# Patient Record
Sex: Female | Born: 1950
Health system: Southern US, Community
[De-identification: ages and names within clinical notes are randomized; demographics above are authoritative.]

## PROBLEM LIST (undated history)

## (undated) DIAGNOSIS — G709 Myoneural disorder, unspecified: Secondary | ICD-10-CM

## (undated) DIAGNOSIS — G4730 Sleep apnea, unspecified: Secondary | ICD-10-CM

## (undated) DIAGNOSIS — M1990 Unspecified osteoarthritis, unspecified site: Secondary | ICD-10-CM

## (undated) DIAGNOSIS — F419 Anxiety disorder, unspecified: Secondary | ICD-10-CM

## (undated) DIAGNOSIS — R059 Cough, unspecified: Secondary | ICD-10-CM

## (undated) DIAGNOSIS — G629 Polyneuropathy, unspecified: Secondary | ICD-10-CM

## (undated) DIAGNOSIS — M109 Gout, unspecified: Secondary | ICD-10-CM

## (undated) DIAGNOSIS — E119 Type 2 diabetes mellitus without complications: Secondary | ICD-10-CM

## (undated) DIAGNOSIS — R2689 Other abnormalities of gait and mobility: Secondary | ICD-10-CM

## (undated) DIAGNOSIS — E079 Disorder of thyroid, unspecified: Secondary | ICD-10-CM

## (undated) DIAGNOSIS — I10 Essential (primary) hypertension: Secondary | ICD-10-CM

## (undated) DIAGNOSIS — C801 Malignant (primary) neoplasm, unspecified: Secondary | ICD-10-CM

## (undated) DIAGNOSIS — R0600 Dyspnea, unspecified: Secondary | ICD-10-CM

## (undated) DIAGNOSIS — R161 Splenomegaly, not elsewhere classified: Secondary | ICD-10-CM

## (undated) DIAGNOSIS — E039 Hypothyroidism, unspecified: Secondary | ICD-10-CM

## (undated) DIAGNOSIS — R05 Cough: Secondary | ICD-10-CM

## (undated) DIAGNOSIS — D759 Disease of blood and blood-forming organs, unspecified: Secondary | ICD-10-CM

## (undated) DIAGNOSIS — Z923 Personal history of irradiation: Secondary | ICD-10-CM

## (undated) DIAGNOSIS — E1140 Type 2 diabetes mellitus with diabetic neuropathy, unspecified: Secondary | ICD-10-CM

## (undated) DIAGNOSIS — K7460 Unspecified cirrhosis of liver: Secondary | ICD-10-CM

## (undated) DIAGNOSIS — C50919 Malignant neoplasm of unspecified site of unspecified female breast: Secondary | ICD-10-CM

## (undated) DIAGNOSIS — G43909 Migraine, unspecified, not intractable, without status migrainosus: Secondary | ICD-10-CM

## (undated) HISTORY — PX: JOINT REPLACEMENT: SHX530

## (undated) HISTORY — DX: Malignant (primary) neoplasm, unspecified: C80.1

## (undated) HISTORY — DX: Migraine, unspecified, not intractable, without status migrainosus: G43.909

## (undated) HISTORY — DX: Anxiety disorder, unspecified: F41.9

## (undated) HISTORY — DX: Essential (primary) hypertension: I10

## (undated) HISTORY — DX: Personal history of irradiation: Z92.3

## (undated) HISTORY — DX: Other abnormalities of gait and mobility: R26.89

## (undated) HISTORY — DX: Cough, unspecified: R05.9

## (undated) HISTORY — PX: TUBAL LIGATION: SHX77

## (undated) HISTORY — DX: Sleep apnea, unspecified: G47.30

## (undated) HISTORY — PX: CHOLECYSTECTOMY: SHX55

## (undated) HISTORY — DX: Cough: R05

---

## 1997-08-07 ENCOUNTER — Ambulatory Visit (HOSPITAL_COMMUNITY): Admission: RE | Admit: 1997-08-07 | Discharge: 1997-08-07 | Payer: Self-pay | Admitting: Obstetrics and Gynecology

## 1997-09-19 ENCOUNTER — Other Ambulatory Visit: Admission: RE | Admit: 1997-09-19 | Discharge: 1997-09-19 | Payer: Self-pay | Admitting: Obstetrics and Gynecology

## 1999-08-18 ENCOUNTER — Ambulatory Visit (HOSPITAL_COMMUNITY): Admission: RE | Admit: 1999-08-18 | Discharge: 1999-08-18 | Payer: Self-pay | Admitting: Family Medicine

## 2002-03-13 ENCOUNTER — Encounter: Payer: Self-pay | Admitting: Family Medicine

## 2002-03-13 ENCOUNTER — Encounter: Admission: RE | Admit: 2002-03-13 | Discharge: 2002-03-13 | Payer: Self-pay | Admitting: Family Medicine

## 2002-10-25 ENCOUNTER — Encounter: Admission: RE | Admit: 2002-10-25 | Discharge: 2002-11-19 | Payer: Self-pay | Admitting: Family Medicine

## 2002-10-27 ENCOUNTER — Encounter: Payer: Self-pay | Admitting: Family Medicine

## 2002-10-27 ENCOUNTER — Encounter: Admission: RE | Admit: 2002-10-27 | Discharge: 2002-10-27 | Payer: Self-pay | Admitting: Family Medicine

## 2004-06-05 ENCOUNTER — Ambulatory Visit (HOSPITAL_BASED_OUTPATIENT_CLINIC_OR_DEPARTMENT_OTHER): Admission: RE | Admit: 2004-06-05 | Discharge: 2004-06-05 | Payer: Self-pay | Admitting: Family Medicine

## 2004-06-14 ENCOUNTER — Ambulatory Visit: Payer: Self-pay | Admitting: Internal Medicine

## 2005-05-18 ENCOUNTER — Encounter: Admission: RE | Admit: 2005-05-18 | Discharge: 2005-05-18 | Payer: Self-pay | Admitting: Internal Medicine

## 2006-07-10 ENCOUNTER — Emergency Department (HOSPITAL_COMMUNITY): Admission: EM | Admit: 2006-07-10 | Discharge: 2006-07-10 | Payer: Self-pay | Admitting: Emergency Medicine

## 2006-10-31 ENCOUNTER — Emergency Department (HOSPITAL_COMMUNITY): Admission: EM | Admit: 2006-10-31 | Discharge: 2006-10-31 | Payer: Self-pay | Admitting: Emergency Medicine

## 2009-07-15 ENCOUNTER — Ambulatory Visit (HOSPITAL_BASED_OUTPATIENT_CLINIC_OR_DEPARTMENT_OTHER): Admission: RE | Admit: 2009-07-15 | Discharge: 2009-07-15 | Payer: Self-pay | Admitting: Family Medicine

## 2009-07-15 ENCOUNTER — Ambulatory Visit: Payer: Self-pay | Admitting: Radiology

## 2010-02-10 ENCOUNTER — Other Ambulatory Visit
Admission: RE | Admit: 2010-02-10 | Discharge: 2010-02-10 | Payer: Self-pay | Source: Home / Self Care | Admitting: Family Medicine

## 2010-02-27 ENCOUNTER — Ambulatory Visit (HOSPITAL_COMMUNITY)
Admission: RE | Admit: 2010-02-27 | Discharge: 2010-02-27 | Payer: Self-pay | Source: Home / Self Care | Attending: Family Medicine | Admitting: Family Medicine

## 2010-06-19 NOTE — Procedures (Signed)
NAME:  [name] [name], [name] [name]                  ACCOUNT NO.:  [id number]   MEDICAL RECORD NO.:  [id number]          PATIENT TYPE:  OUT   LOCATION:  SLEEP CENTER                 FACILITY:  [facility]   PHYSICIAN:  Clinton D. Maple Hudson, M.D. DATE OF BIRTH:  1950/06/16   DATE OF STUDY:  06/05/2004                              NOCTURNAL POLYSOMNOGRAM   REFERRING PHYSICIAN:  Dr. Casimiro Needle Hilts   DATE OF STUDY:  Gaylord 5, 2006   INDICATION FOR STUDY:  Hypersomnia with sleep apnea.  Epworth Sleepiness  Score 18/24, BMI 49, weight 275 pounds.   SLEEP ARCHITECTURE:  Total sleep time 298 minutes with sleep efficiency 67%.  Stage I was 15%, stage II 58%, stages III and IV 19% and REM was 9% of total  sleep time.  Sleep latency 36 minutes, REM latency 303 minutes, awake after  sleep onset 115 minutes, arousal index increased at 36.   RESPIRATORY DATA:  Split-study protocol.  Respiratory disturbance index  (RDI, AHI) 21.4 obstructive events per hour indicating moderate obstructive  sleep apnea/hypopnea syndrome before CPAP.  There were 52 hypopneas before  CPAP.  Events were not positional.  REM RDI 0.  CPAP was titrated  successfully to 9 CWP, RDI 2 per hour with control of snoring.  A small  Respironics ComfortClassic Nasal Mask was used with heated humidifier.   OXYGEN DATA:  Moderate snoring with oxygen desaturation to a nadir of 88%  before CPAP.  After CPAP control oxygen saturation held 94-98% on room air.   CARDIAC DATA:  Normal sinus rhythm with some tachycardia to 110 beats per  minute.   MOVEMENT/PARASOMNIA:  A total of 117 limb jerks were recorded of which 10  were associated with arousal or awakening for a periodic limb movement with  arousal index of 2 per hour which is probably not significant.  EEG revealed  prolonged intervals of high voltage slow waves due to 4 per second.  This  Janssen represent a metabolic or medication effect, possibility of seizure  activity can be excluded from this study.  No  motor abnormality,  incontinence or postictal pattern was noted by the technician.   IMPRESSION/RECOMMENDATION:  1.  Moderate obstructive sleep apnea/hypopnea syndrome, respiratory      disturbance index 21.4 per hour with moderate snoring and oxygen      desaturation to 88%.  2.  Successful continuous positive airway pressure titration to 9 CWP,      respiratory disturbance index 2 per hour, using a small Respironics      ComfortClassic Nasal Mask with heated humidifier.  3.  Occasional sinus tachycardia to 110 during sleep.  4.  Atypical EEG pattern with high voltage slow waves, a metabolic      abnormality or seizure pattern cannot be completely excluded from this      study.      CDY/MEDQ  D:  06/14/2004 10:19:22  T:  06/14/2004 11:51:59  Job:  [id number]

## 2010-07-29 ENCOUNTER — Encounter (HOSPITAL_COMMUNITY): Payer: BC Managed Care – PPO

## 2010-07-29 ENCOUNTER — Other Ambulatory Visit (HOSPITAL_COMMUNITY): Payer: Self-pay | Admitting: Orthopaedic Surgery

## 2010-07-29 ENCOUNTER — Other Ambulatory Visit: Payer: Self-pay | Admitting: Orthopaedic Surgery

## 2010-07-29 ENCOUNTER — Ambulatory Visit (HOSPITAL_COMMUNITY)
Admission: RE | Admit: 2010-07-29 | Discharge: 2010-07-29 | Disposition: A | Discharge status: Home / Self Care | Payer: BC Managed Care – PPO | Source: Ambulatory Visit | Attending: Orthopaedic Surgery | Admitting: Orthopaedic Surgery

## 2010-07-29 DIAGNOSIS — M1712 Unilateral primary osteoarthritis, left knee: Secondary | ICD-10-CM

## 2010-07-29 DIAGNOSIS — Z01811 Encounter for preprocedural respiratory examination: Secondary | ICD-10-CM | POA: Insufficient documentation

## 2010-07-29 DIAGNOSIS — M1710 Unilateral primary osteoarthritis, unspecified knee: Secondary | ICD-10-CM | POA: Insufficient documentation

## 2010-07-29 DIAGNOSIS — E119 Type 2 diabetes mellitus without complications: Secondary | ICD-10-CM | POA: Insufficient documentation

## 2010-07-29 DIAGNOSIS — I10 Essential (primary) hypertension: Secondary | ICD-10-CM

## 2010-07-29 DIAGNOSIS — Z79899 Other long term (current) drug therapy: Secondary | ICD-10-CM | POA: Insufficient documentation

## 2010-07-29 DIAGNOSIS — Z01812 Encounter for preprocedural laboratory examination: Secondary | ICD-10-CM | POA: Insufficient documentation

## 2010-07-29 LAB — CBC
MCH: 28.7 pg (ref 26.0–34.0)
MCHC: 32.3 g/dL (ref 30.0–36.0)
Platelets: 109 10*3/uL — ABNORMAL LOW (ref 150–400)
RBC: 4.18 MIL/uL (ref 3.87–5.11)
RDW: 14.7 % (ref 11.5–15.5)

## 2010-07-29 LAB — URINALYSIS, ROUTINE W REFLEX MICROSCOPIC
Bilirubin Urine: NEGATIVE
Nitrite: NEGATIVE
Specific Gravity, Urine: 1.015 (ref 1.005–1.030)
Urobilinogen, UA: 0.2 mg/dL (ref 0.0–1.0)

## 2010-07-29 LAB — BASIC METABOLIC PANEL
Calcium: 9.7 mg/dL (ref 8.4–10.5)
GFR calc non Af Amer: >60 mL/min (ref >60)
Glucose, Bld: 130 mg/dL — ABNORMAL HIGH (ref 70–99)
Sodium: 139 mEq/L (ref 135–145)

## 2010-07-29 LAB — SURGICAL PCR SCREEN: MRSA, PCR: NEGATIVE

## 2010-07-31 ENCOUNTER — Inpatient Hospital Stay (HOSPITAL_COMMUNITY)
Admission: RE | Admit: 2010-07-31 | Discharge: 2010-08-03 | DRG: 209 | Disposition: A | Discharge status: Home Health | Payer: BC Managed Care – PPO | Source: Ambulatory Visit | Attending: Orthopaedic Surgery | Admitting: Orthopaedic Surgery

## 2010-07-31 ENCOUNTER — Inpatient Hospital Stay (HOSPITAL_COMMUNITY): Payer: BC Managed Care – PPO

## 2010-07-31 DIAGNOSIS — E669 Obesity, unspecified: Secondary | ICD-10-CM | POA: Diagnosis present

## 2010-07-31 DIAGNOSIS — M1710 Unilateral primary osteoarthritis, unspecified knee: Principal | ICD-10-CM | POA: Diagnosis present

## 2010-07-31 DIAGNOSIS — D62 Acute posthemorrhagic anemia: Secondary | ICD-10-CM | POA: Diagnosis not present

## 2010-07-31 DIAGNOSIS — E119 Type 2 diabetes mellitus without complications: Secondary | ICD-10-CM | POA: Diagnosis present

## 2010-07-31 DIAGNOSIS — G4733 Obstructive sleep apnea (adult) (pediatric): Secondary | ICD-10-CM | POA: Diagnosis present

## 2010-07-31 DIAGNOSIS — Z01812 Encounter for preprocedural laboratory examination: Secondary | ICD-10-CM

## 2010-07-31 DIAGNOSIS — I10 Essential (primary) hypertension: Secondary | ICD-10-CM | POA: Diagnosis present

## 2010-07-31 LAB — PROTIME-INR
INR: 1.25 (ref 0.00–1.49)
Prothrombin Time: 16 seconds — ABNORMAL HIGH (ref 11.6–15.2)

## 2010-07-31 LAB — APTT: aPTT: 38 seconds — ABNORMAL HIGH (ref 24–37)

## 2010-07-31 LAB — TYPE AND SCREEN: Antibody Screen: NEGATIVE

## 2010-07-31 LAB — GLUCOSE, CAPILLARY
Glucose-Capillary: 105 mg/dL — ABNORMAL HIGH (ref 70–99)
Glucose-Capillary: 139 mg/dL — ABNORMAL HIGH (ref 70–99)

## 2010-08-01 LAB — CBC
MCHC: 32.8 g/dL (ref 30.0–36.0)
Platelets: 118 10*3/uL — ABNORMAL LOW (ref 150–400)
RDW: 14.9 % (ref 11.5–15.5)

## 2010-08-01 LAB — BASIC METABOLIC PANEL
GFR calc Af Amer: >60 mL/min (ref >60)
GFR calc non Af Amer: >60 mL/min (ref >60)
Potassium: 4.6 mEq/L (ref 3.5–5.1)
Sodium: 137 mEq/L (ref 135–145)

## 2010-08-01 NOTE — H&P (Signed)
  NAMEHAYAT, WARBINGTON NO.:  [id number]  MEDICAL RECORD NO.:  [id number]  LOCATION:  0008                         FACILITY:  [facility]  PHYSICIAN:  Vanita Panda. Magnus Ivan, M.D.DATE OF BIRTH:  18-Feb-1950  DATE OF ADMISSION:  07/31/2010 DATE OF DISCHARGE:                             HISTORY & PHYSICAL   CHIEF COMPLAINT:  Severe left knee pain.  HISTORY OF PRESENT ILLNESS:  [name] is a 60 year old female well known to me.  She has severe debilitating pain over the left knee with known end-stage arthritis.  We have tried injections and other things to calm down this, bringing her to tears now the amount of pain that she is having.  She wishes at this point to proceed with total knee replacement.  She understands the risks and benefits of this as well and does wish to proceed with surgery.  PAST MEDICAL HISTORY: 1. Diabetes. 2. Thyroid disease. 3. Arthritis. 4. Depression. 5. Gout. 6. Peripheral neuropathy.  MEDICATIONS:  See medical reconciliation orders.  ALLERGIES:  Drug allergies to CODEINE.  FAMILY MEDICAL HISTORY:  Significant for diabetes.  SOCIAL HISTORY:  She is married.  Her husband is with her.  She works as a Youth worker for the [employer]  She does not smoke and does not drink.  REVIEW OF SYSTEMS:  Negative for chest pain, shortness of breath, fever, chills, nausea, vomiting.  PHYSICAL EXAMINATION:  VITAL SIGNS:  She is afebrile with stable vital signs. GENERAL:  She is alert and oriented x3, in no acute distress. HEENT:  Normocephalic, atraumatic.  Pupils equally round and reactive to light.  Extraocular muscles intact. NECK:  Supple. LUNGS:  Clear to auscultation bilaterally. HEART:  Regular rate and rhythm. ABDOMEN:  Obese but benign. EXTREMITIES:  Left knee, she has a slight valgus deformity with effusion and severe pain with flexion and extension.  She has good motion overall.  X-rays were reviewed and show  end-stage arthritis of the left knee.  ASSESSMENT:  This is a 60 year old female who is diabetic with end-stage arthritis of the left knee.  PLAN:  We will proceed with left total knee arthroplasty today.  She will be admitted as inpatient.     Vanita Panda. Magnus Ivan, M.D.     CYB/MEDQ  D:  07/31/2010  T:  07/31/2010  Job:  [id number]  Electronically Signed by Doneen Poisson M.D. on 08/01/2010 05:11:01 PM

## 2010-08-01 NOTE — Op Note (Signed)
[name] [name], [name] [name] NO.:  [id number]  MEDICAL RECORD NO.:  [id number]  LOCATION:  1606                         FACILITY:  [facility]  PHYSICIAN:  Vanita Panda. Magnus Ivan, M.D.DATE OF BIRTH:  Aug 22, 1950  DATE OF PROCEDURE:  07/31/2010 DATE OF DISCHARGE:                              OPERATIVE REPORT   PREOPERATIVE DIAGNOSES:  Severe osteoarthritis and degenerative joint disease, left knee.  POSTOPERATIVE DIAGNOSES:  Severe osteoarthritis and degenerative joint disease, left knee.  PROCEDURE:  Left total knee arthroplasty utilizing computer navigation assistance.  IMPLANTS:  DePuy rotating platform knee/PFC Sigma with size 4 narrow femur, size 3 tibial tray, size 10 polyethylene insert, size 32 polyethylene patellar button.  SURGEON:  Vanita Panda. Magnus Ivan, MD  ASSISTANT:  Wende Neighbors, P.A.  ANESTHESIA: 1. Regional left femoral nerve block. 2. Spinal.  ANTIBIOTICS:  IV Ancef 1 g.  TOURNIQUET TIME:  95 minutes.  BLOOD LOSS:  300 cc.  COMPLICATIONS:  None.  INDICATIONS:  Ms. Deckman is a 60 year old gentleman with debilitating arthritis involving her left knee.  She has a significant valgus deformity and flexion contracture of the knee as well.  We have tried conservative treatment with anti-inflammatories, injections.  It has gotten to where it has affected her activities of daily living and she is crying quite a bit because her pain has been so severe.  She wishes to proceed with a total knee arthroplasty.  The risks and benefits of this has been explained to her in detail and she does wish to proceed with surgery.  PROCEDURE DESCRIPTION:  After informed consent was obtained, appropriate left leg was marked.  Anesthesia was obtained with a femoral nerve block.  She was then brought to the operating room, placed supine on the operating table.  She was sat up first for spinal anesthesia as well and then laid down supine.  A Foley catheter was  placed and a nonsterile tourniquet was placed around the upper left leg.  The left leg was then prepped and draped with DuraPrep and sterile drapes including a sterile stockinette.  A time-out was called and she was identified as correct patient and correct left knee.  I then used an Esmarch to wrap out the knee and the tourniquet was inflated to 300 mm of pressure.  A midline incision was then made directly over the knee and carried proximally and distally.  We dissected down to the knee joint and then performed a medial parapatellar arthrotomy.  There were significant osteophytes and synovitis within the knee and complete loss of cartilage throughout the knee.  We removed osteophytes with a rongeur as well as remnants of the PCL, ACL, and meniscus.  Next, attention was turned to the computer navigation portion of the case.  Through 2 small stab incisions in the distal third of the tibia, 2 temporary Steinmann pins were placed from the anteromedial to posterolateral direction.  To the main incision, 2 pins were also placed in the metaphyseal section of the femur.  Off these Steinmann pins, small globes were placed for computer navigation portion of the case.  We put the hip through internal and external rotation, flexion and extension of  the knee and marked several spots throughout the knee per our computer plan to come up with a computer- generated model of the knee that showed a significant flexion contracture of about 12 degrees and a valgus deformity of 7 degrees. Based on our computer navigation plan, we were able to release soft tissues and make a tibia cut starting at 10 mm off the high side.  We then actually had to cut 13 mm off the distal femur as well.  We used soft tissue tensioners to balance the knee in flexion and extension.  We set our rotation guide for the femur and then all using the computer. We then made our anterior and posterior cuts and chamfer cuts as  well. Attention was turned to releasing osteophytes from the posterior femoral condyles and cleaning debris from behind the knee.  We then selected a size 3 tibia and drilled a post and keel for this.  We placed a trial 4 narrow femur followed by size 3 tibial tray and a 10 mm polyethylene insert and looking at the computer navigation screen and the knee, we were able to correct her flexion contracture down to neutral as well as bring her out of valgus to neutral alignment.  We then removed all trial components.  We removed all Steinmann pins as well.  We cemented the real size 4 narrow femur, the real size 3 tibial tray.  We placed the real 10 mm polyethylene insert and cemented the patellar button as well. We then allowed the cement to dry and harden.  The tourniquet was let down and hemostasis was obtained with electrocautery.  We then copiously irrigated the knee with normal saline solution using pulsatile lavage of 3 L.  I placed a medium Hemovac drain and closed the arthrotomy with interrupted #1 Vicryl followed by 2-0 Vicryl in the subcutaneous tissue and staples on the skin.  The 2 small stab incisions from the Steinmann pins were closed with interrupted 3-0 nylon.  Xeroform followed by well- padded sterile dressing was applied and the patient was taken to recovery room in stable condition with all final counts being correct and no complications noted.  Of note, Maud Deed, [facility] was present for the entirety of the case. Her assistance was integral in the computer navigation aspect of the case as well as placing the implants and closure.     Vanita Panda. Magnus Ivan, M.D.     CYB/MEDQ  D:  07/31/2010  T:  08/01/2010  Job:  [id number]  Electronically Signed by Doneen Poisson M.D. on 08/01/2010 05:11:04 PM

## 2010-08-02 LAB — BASIC METABOLIC PANEL
CO2: 26 mEq/L (ref 19–32)
GFR calc non Af Amer: >60 mL/min (ref >60)
Glucose, Bld: 156 mg/dL — ABNORMAL HIGH (ref 70–99)
Potassium: 4.5 mEq/L (ref 3.5–5.1)
Sodium: 136 mEq/L (ref 135–145)

## 2010-08-02 LAB — CBC
Hemoglobin: 9.4 g/dL — ABNORMAL LOW (ref 12.0–15.0)
Platelets: 107 10*3/uL — ABNORMAL LOW (ref 150–400)
RBC: 3.22 MIL/uL — ABNORMAL LOW (ref 3.87–5.11)

## 2010-08-03 LAB — BASIC METABOLIC PANEL
CO2: 27 mEq/L (ref 19–32)
Calcium: 9.2 mg/dL (ref 8.4–10.5)
Chloride: 101 mEq/L (ref 96–112)
Glucose, Bld: 175 mg/dL — ABNORMAL HIGH (ref 70–99)
Sodium: 137 mEq/L (ref 135–145)

## 2010-08-03 LAB — CBC
Hemoglobin: 9.3 g/dL — ABNORMAL LOW (ref 12.0–15.0)
MCH: 29.1 pg (ref 26.0–34.0)
RBC: 3.2 MIL/uL — ABNORMAL LOW (ref 3.87–5.11)
WBC: 7.4 10*3/uL (ref 4.0–10.5)

## 2010-08-03 LAB — GLUCOSE, CAPILLARY: Glucose-Capillary: 173 mg/dL — ABNORMAL HIGH (ref 70–99)

## 2010-08-07 NOTE — Discharge Summary (Signed)
  NAMEKESLEIGH, [name] NO.:  [id number]  MEDICAL RECORD NO.:  [id number]  LOCATION:  1606                         FACILITY:  [facility]  PHYSICIAN:  Vanita Panda. Magnus Ivan, M.D.DATE OF BIRTH:  29-May-1950  DATE OF ADMISSION:  07/31/2010 DATE OF DISCHARGE:  08/03/2010                              DISCHARGE SUMMARY   ADMITTING DIAGNOSES:  Severe osteoarthritis and degenerative joint disease, left knee.  POSTOPERATIVE DIAGNOSES:  Severe osteoarthritis and degenerative joint disease, left knee.  PROCEDURE:  Left total knee arthroplasty on July 31, 2010.  HOSPITAL COURSE:  [name] is a 60 year old female with severe debilitating arthritis involving her left knee.  She was taken to the operating room on the day of admission where she underwent an elective total knee arthroplasty on the left knee.  This was uneventful.  She was admitted as an inpatient and had an uneventful hospital stay.  She worked with PT and OT and by the day of discharge, it was felt she could be discharged safely to home.  DISPOSITION:  Discharged to home.  DISCHARGE MEDICATIONS: 1. Xarelto for 10 days. 2. Percocet. 3. Robaxin. 4. Continue home medications as before including multivitamins,     lisinopril, gabapentin, metformin, and levothyroxine.  DISCHARGE INSTRUCTIONS:  While she is at home, she can get her incision wet in shower, starting this one today.  She can work on balance, gait training, coordination, and range of motion with therapy.  A followup appointment will be established in 2 weeks.     Vanita Panda. Magnus Ivan, M.D.     CYB/MEDQ  D:  08/03/2010  T:  08/03/2010  Job:  [id number]  Electronically Signed by Doneen Poisson M.D. on 08/07/2010 06:30:16 PM

## 2010-11-12 LAB — APTT: aPTT: 29

## 2010-11-12 LAB — DIFFERENTIAL
Basophils Absolute: 0.1
Lymphocytes Relative: 31
Monocytes Absolute: 0.6
Neutro Abs: 5.7
Neutrophils Relative %: 59

## 2010-11-12 LAB — COMPREHENSIVE METABOLIC PANEL
Albumin: 4.4
BUN: 12
Chloride: 104
Creatinine, Ser: 0.82
Total Bilirubin: 0.7
Total Protein: 8.7 — ABNORMAL HIGH

## 2010-11-12 LAB — PROTIME-INR
INR: 1
Prothrombin Time: 13.4

## 2010-11-12 LAB — URINE CULTURE: Colony Count: 5000

## 2010-11-12 LAB — URINALYSIS, ROUTINE W REFLEX MICROSCOPIC
Bilirubin Urine: NEGATIVE
Glucose, UA: NEGATIVE
Hgb urine dipstick: NEGATIVE
Ketones, ur: NEGATIVE
Nitrite: NEGATIVE
Protein, ur: NEGATIVE
Specific Gravity, Urine: 1.014
Urobilinogen, UA: 0.2
pH: 5.5

## 2010-11-12 LAB — CBC
HCT: 42
MCV: 86.9
Platelets: 270
RDW: 13.8

## 2011-12-09 ENCOUNTER — Emergency Department (HOSPITAL_COMMUNITY): Payer: BC Managed Care – PPO

## 2011-12-09 ENCOUNTER — Observation Stay (HOSPITAL_COMMUNITY)
Admission: EM | Admit: 2011-12-09 | Discharge: 2011-12-10 | Disposition: A | Discharge status: Home / Self Care | Payer: BC Managed Care – PPO | Attending: General Surgery | Admitting: General Surgery

## 2011-12-09 ENCOUNTER — Encounter (HOSPITAL_COMMUNITY): Payer: Self-pay

## 2011-12-09 DIAGNOSIS — Y92410 Unspecified street and highway as the place of occurrence of the external cause: Secondary | ICD-10-CM | POA: Insufficient documentation

## 2011-12-09 DIAGNOSIS — R079 Chest pain, unspecified: Secondary | ICD-10-CM | POA: Insufficient documentation

## 2011-12-09 DIAGNOSIS — E119 Type 2 diabetes mellitus without complications: Secondary | ICD-10-CM

## 2011-12-09 DIAGNOSIS — S301XXA Contusion of abdominal wall, initial encounter: Secondary | ICD-10-CM

## 2011-12-09 DIAGNOSIS — S20219A Contusion of unspecified front wall of thorax, initial encounter: Principal | ICD-10-CM

## 2011-12-09 DIAGNOSIS — R51 Headache: Secondary | ICD-10-CM | POA: Insufficient documentation

## 2011-12-09 DIAGNOSIS — Z96659 Presence of unspecified artificial knee joint: Secondary | ICD-10-CM | POA: Insufficient documentation

## 2011-12-09 DIAGNOSIS — E1142 Type 2 diabetes mellitus with diabetic polyneuropathy: Secondary | ICD-10-CM | POA: Insufficient documentation

## 2011-12-09 DIAGNOSIS — E1149 Type 2 diabetes mellitus with other diabetic neurological complication: Secondary | ICD-10-CM | POA: Insufficient documentation

## 2011-12-09 DIAGNOSIS — S3011XA Contusion of abdominal wall, initial encounter: Secondary | ICD-10-CM

## 2011-12-09 DIAGNOSIS — Z79899 Other long term (current) drug therapy: Secondary | ICD-10-CM | POA: Insufficient documentation

## 2011-12-09 DIAGNOSIS — M549 Dorsalgia, unspecified: Secondary | ICD-10-CM | POA: Insufficient documentation

## 2011-12-09 DIAGNOSIS — M79609 Pain in unspecified limb: Secondary | ICD-10-CM | POA: Insufficient documentation

## 2011-12-09 HISTORY — DX: Type 2 diabetes mellitus without complications: E11.9

## 2011-12-09 HISTORY — DX: Gout, unspecified: M10.9

## 2011-12-09 HISTORY — DX: Disorder of thyroid, unspecified: E07.9

## 2011-12-09 HISTORY — DX: Polyneuropathy, unspecified: G62.9

## 2011-12-09 HISTORY — DX: Contusion of unspecified front wall of thorax, initial encounter: S20.219A

## 2011-12-09 HISTORY — DX: Contusion of abdominal wall, initial encounter: S30.1XXA

## 2011-12-09 LAB — COMPREHENSIVE METABOLIC PANEL
CO2: 21 mEq/L (ref 19–32)
Calcium: 10.3 mg/dL (ref 8.4–10.5)
Creatinine, Ser: 0.67 mg/dL (ref 0.50–1.10)
GFR calc Af Amer: >90 mL/min (ref >90)
GFR calc non Af Amer: >90 mL/min (ref >90)
Glucose, Bld: 166 mg/dL — ABNORMAL HIGH (ref 70–99)

## 2011-12-09 LAB — CBC WITH DIFFERENTIAL/PLATELET
Eosinophils Absolute: 0.3 10*3/uL (ref 0.0–0.7)
Eosinophils Relative: 4 % (ref 0–5)
HCT: 33.7 % — ABNORMAL LOW (ref 36.0–46.0)
Hemoglobin: 11.1 g/dL — ABNORMAL LOW (ref 12.0–15.0)
Lymphocytes Relative: 22 % (ref 12–46)
Lymphs Abs: 2 10*3/uL (ref 0.7–4.0)
MCH: 28.2 pg (ref 26.0–34.0)
MCV: 85.5 fL (ref 78.0–100.0)
Monocytes Absolute: 0.6 10*3/uL (ref 0.1–1.0)
Monocytes Relative: 7 % (ref 3–12)
RBC: 3.94 MIL/uL (ref 3.87–5.11)

## 2011-12-09 LAB — POCT I-STAT, CHEM 8
Chloride: 105 mEq/L (ref 96–112)
HCT: 34 % — ABNORMAL LOW (ref 36.0–46.0)
Potassium: 3.8 mEq/L (ref 3.5–5.1)
Sodium: 140 mEq/L (ref 135–145)

## 2011-12-09 LAB — URINALYSIS, ROUTINE W REFLEX MICROSCOPIC
Glucose, UA: NEGATIVE mg/dL
Hgb urine dipstick: NEGATIVE
Ketones, ur: NEGATIVE mg/dL
Protein, ur: NEGATIVE mg/dL
pH: 6 (ref 5.0–8.0)

## 2011-12-09 MED ORDER — GABAPENTIN 600 MG PO TABS
1200.0000 mg | ORAL_TABLET | Freq: Every day | ORAL | Status: DC
  Administered 2011-12-09: 1200 mg via ORAL
  Filled 2011-12-09 (×2): qty 2

## 2011-12-09 MED ORDER — HYDROMORPHONE HCL PF 1 MG/ML IJ SOLN
0.5000 mg | INTRAMUSCULAR | Status: DC | PRN
  Administered 2011-12-10: 1 mg via INTRAVENOUS
  Filled 2011-12-09: qty 1

## 2011-12-09 MED ORDER — SODIUM CHLORIDE 0.9 % IV SOLN
1000.0000 mL | INTRAVENOUS | Status: DC
  Administered 2011-12-09: 1000 mL via INTRAVENOUS

## 2011-12-09 MED ORDER — ENOXAPARIN SODIUM 40 MG/0.4ML ~~LOC~~ SOLN
40.0000 mg | Freq: Two times a day (BID) | SUBCUTANEOUS | Status: DC
  Administered 2011-12-10: 40 mg via SUBCUTANEOUS
  Filled 2011-12-09 (×2): qty 0.4

## 2011-12-09 MED ORDER — INSULIN ASPART 100 UNIT/ML ~~LOC~~ SOLN: 0.0000 [IU] | Freq: Every day | SUBCUTANEOUS | Status: DC

## 2011-12-09 MED ORDER — ONDANSETRON HCL 4 MG PO TABS: 4.0000 mg | ORAL_TABLET | Freq: Four times a day (QID) | ORAL | Status: DC | PRN

## 2011-12-09 MED ORDER — IOHEXOL 300 MG/ML  SOLN
100.0000 mL | Freq: Once | INTRAMUSCULAR | Status: AC | PRN
  Administered 2011-12-09: 100 mL via INTRAVENOUS

## 2011-12-09 MED ORDER — ONDANSETRON HCL 4 MG/2ML IJ SOLN
4.0000 mg | Freq: Once | INTRAMUSCULAR | Status: AC
  Administered 2011-12-09: 4 mg via INTRAVENOUS
  Filled 2011-12-09: qty 2

## 2011-12-09 MED ORDER — ONDANSETRON HCL 4 MG/2ML IJ SOLN: 4.0000 mg | Freq: Four times a day (QID) | INTRAMUSCULAR | Status: DC | PRN

## 2011-12-09 MED ORDER — COLCHICINE 0.6 MG PO TABS
0.6000 mg | ORAL_TABLET | Freq: Two times a day (BID) | ORAL | Status: DC | PRN
  Filled 2011-12-09: qty 1

## 2011-12-09 MED ORDER — POTASSIUM CHLORIDE IN NACL 20-0.45 MEQ/L-% IV SOLN
INTRAVENOUS | Status: DC
  Administered 2011-12-09: 20:00:00 via INTRAVENOUS
  Filled 2011-12-09 (×3): qty 1000

## 2011-12-09 MED ORDER — ACETAMINOPHEN 325 MG PO TABS: 650.0000 mg | ORAL_TABLET | ORAL | Status: DC | PRN

## 2011-12-09 MED ORDER — LORAZEPAM 2 MG/ML IJ SOLN
1.0000 mg | Freq: Once | INTRAMUSCULAR | Status: AC
  Administered 2011-12-09: 1 mg via INTRAVENOUS
  Filled 2011-12-09: qty 1

## 2011-12-09 MED ORDER — GABAPENTIN 600 MG PO TABS
600.0000 mg | ORAL_TABLET | ORAL | Status: DC
Start: 2011-12-10 — End: 2011-12-10
  Administered 2011-12-10: 600 mg via ORAL
  Filled 2011-12-09 (×2): qty 1

## 2011-12-09 MED ORDER — GABAPENTIN 600 MG PO TABS
600.0000 mg | ORAL_TABLET | Freq: Three times a day (TID) | ORAL | Status: DC
  Filled 2011-12-09 (×2): qty 2

## 2011-12-09 MED ORDER — ONDANSETRON HCL 4 MG/2ML IJ SOLN
INTRAMUSCULAR | Status: AC
  Administered 2011-12-09: 4 mg via INTRAVENOUS
  Filled 2011-12-09: qty 2

## 2011-12-09 MED ORDER — ALPRAZOLAM 0.25 MG PO TABS
0.2500 mg | ORAL_TABLET | Freq: Two times a day (BID) | ORAL | Status: DC | PRN
  Administered 2011-12-09: 0.5 mg via ORAL
  Filled 2011-12-09 (×2): qty 1

## 2011-12-09 MED ORDER — ALLOPURINOL 300 MG PO TABS
300.0000 mg | ORAL_TABLET | Freq: Every day | ORAL | Status: DC
  Administered 2011-12-10: 300 mg via ORAL
  Filled 2011-12-09: qty 1

## 2011-12-09 MED ORDER — FENTANYL CITRATE 0.05 MG/ML IJ SOLN
50.0000 ug | Freq: Once | INTRAMUSCULAR | Status: AC
  Administered 2011-12-09: 50 ug via INTRAVENOUS
  Filled 2011-12-09: qty 2

## 2011-12-09 MED ORDER — TRAMADOL HCL 50 MG PO TABS
50.0000 mg | ORAL_TABLET | Freq: Four times a day (QID) | ORAL | Status: DC | PRN
  Administered 2011-12-09 – 2011-12-10 (×2): 50 mg via ORAL
  Filled 2011-12-09 (×2): qty 1

## 2011-12-09 MED ORDER — SODIUM CHLORIDE 0.9 % IV SOLN
1000.0000 mL | Freq: Once | INTRAVENOUS | Status: AC
  Administered 2011-12-09: 1000 mL via INTRAVENOUS

## 2011-12-09 MED ORDER — CITALOPRAM HYDROBROMIDE 20 MG PO TABS
20.0000 mg | ORAL_TABLET | Freq: Every day | ORAL | Status: DC
  Administered 2011-12-10: 20 mg via ORAL
  Filled 2011-12-09: qty 1

## 2011-12-09 MED ORDER — INSULIN ASPART 100 UNIT/ML ~~LOC~~ SOLN
0.0000 [IU] | Freq: Three times a day (TID) | SUBCUTANEOUS | Status: DC
  Administered 2011-12-10: 3 [IU] via SUBCUTANEOUS
  Administered 2011-12-10: 4 [IU] via SUBCUTANEOUS

## 2011-12-09 MED ORDER — CELECOXIB 200 MG PO CAPS
200.0000 mg | ORAL_CAPSULE | Freq: Every day | ORAL | Status: DC
  Administered 2011-12-10: 200 mg via ORAL
  Filled 2011-12-09 (×2): qty 1

## 2011-12-09 MED ORDER — GABAPENTIN 600 MG PO TABS
600.0000 mg | ORAL_TABLET | ORAL | Status: DC
  Administered 2011-12-10: 600 mg via ORAL
  Filled 2011-12-09: qty 1

## 2011-12-09 MED ORDER — LISINOPRIL 40 MG PO TABS
80.0000 mg | ORAL_TABLET | Freq: Every day | ORAL | Status: DC
  Administered 2011-12-10: 80 mg via ORAL
  Filled 2011-12-09: qty 2

## 2011-12-09 MED ORDER — LEVOTHYROXINE SODIUM 200 MCG PO TABS
200.0000 ug | ORAL_TABLET | Freq: Every day | ORAL | Status: DC
  Administered 2011-12-10: 200 ug via ORAL
  Filled 2011-12-09 (×2): qty 1

## 2011-12-09 NOTE — ED Notes (Signed)
Trauma md at bedside
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
## 2011-12-09 NOTE — Progress Notes (Signed)
Pt put on CPAP 11:38. Vitals are stable HR 205. RR 18. Oxygen saturation 95%. Auto mode minimum 5 cmH2O maximum 20cmH2O. Full face mask size medium. Pt is comfortable will continue to monitor.

## 2011-12-09 NOTE — ED Notes (Signed)
Attempted to call report x1, asked to call back 
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
## 2011-12-09 NOTE — ED Notes (Signed)
Pt nausea improving.
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
## 2011-12-09 NOTE — ED Notes (Signed)
Report received, pt remains in radiology at this time.

## 2011-12-09 NOTE — ED Notes (Signed)
Pt unable to be transferred per floor for minimum of 45 minutes. Charge made aware.

## 2011-12-10 LAB — BASIC METABOLIC PANEL
GFR calc Af Amer: >90 mL/min (ref >90)
GFR calc non Af Amer: >90 mL/min (ref >90)
Glucose, Bld: 135 mg/dL — ABNORMAL HIGH (ref 70–99)
Potassium: 4 mEq/L (ref 3.5–5.1)
Sodium: 138 mEq/L (ref 135–145)

## 2011-12-10 LAB — GLUCOSE, CAPILLARY
Glucose-Capillary: 142 mg/dL — ABNORMAL HIGH (ref 70–99)
Glucose-Capillary: 169 mg/dL — ABNORMAL HIGH (ref 70–99)

## 2011-12-10 LAB — CBC
HCT: 28.2 % — ABNORMAL LOW (ref 36.0–46.0)
Hemoglobin: 9 g/dL — ABNORMAL LOW (ref 12.0–15.0)
RBC: 3.29 MIL/uL — ABNORMAL LOW (ref 3.87–5.11)
RDW: 15.2 % (ref 11.5–15.5)
WBC: 6.3 10*3/uL (ref 4.0–10.5)

## 2011-12-10 MED ORDER — TRAMADOL HCL 50 MG PO TABS: 50.0000 mg | ORAL_TABLET | Freq: Four times a day (QID) | ORAL | Status: DC | PRN

## 2011-12-10 MED ORDER — DIPHENHYDRAMINE HCL 12.5 MG/5ML PO ELIX
25.0000 mg | ORAL_SOLUTION | Freq: Four times a day (QID) | ORAL | Status: DC | PRN
  Administered 2011-12-10: 25 mg via ORAL
  Filled 2011-12-10: qty 5

## 2011-12-10 NOTE — Discharge Summary (Signed)
Her hemoglobin has dropped, but she has enough soft tissue injury to account for the blood loss.  This patient has been seen and I agree with the findings and treatment plan.  Marta Lamas. Gae Bon, MD, FACS [phone] (pager) [phone] (direct pager) Trauma Surgeon
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
## 2011-12-14 NOTE — ED Provider Notes (Signed)
Medical screening examination/treatment/procedure(s) were conducted as a shared visit with non-physician practitioner(s) and myself.  I personally evaluated the patient during the encounter  Flint Melter, MD 12/14/11 [phone]

## 2012-04-18 ENCOUNTER — Other Ambulatory Visit: Payer: Self-pay | Admitting: Family Medicine

## 2012-04-18 DIAGNOSIS — N644 Mastodynia: Secondary | ICD-10-CM

## 2012-04-18 DIAGNOSIS — N63 Unspecified lump in unspecified breast: Secondary | ICD-10-CM

## 2012-04-25 ENCOUNTER — Other Ambulatory Visit: Payer: Self-pay | Admitting: Family Medicine

## 2012-04-25 DIAGNOSIS — N63 Unspecified lump in unspecified breast: Secondary | ICD-10-CM

## 2012-04-25 DIAGNOSIS — N644 Mastodynia: Secondary | ICD-10-CM

## 2012-05-04 ENCOUNTER — Other Ambulatory Visit: Payer: BC Managed Care – PPO

## 2012-05-10 ENCOUNTER — Ambulatory Visit
Admission: RE | Admit: 2012-05-10 | Discharge: 2012-05-10 | Disposition: A | Discharge status: Home / Self Care | Payer: BC Managed Care – PPO | Source: Ambulatory Visit | Attending: Family Medicine | Admitting: Family Medicine

## 2012-05-10 ENCOUNTER — Other Ambulatory Visit: Payer: Self-pay | Admitting: Family Medicine

## 2012-05-10 DIAGNOSIS — N63 Unspecified lump in unspecified breast: Secondary | ICD-10-CM

## 2012-05-10 DIAGNOSIS — N644 Mastodynia: Secondary | ICD-10-CM

## 2012-09-04 ENCOUNTER — Encounter: Payer: Self-pay | Admitting: Internal Medicine

## 2012-09-05 ENCOUNTER — Ambulatory Visit (INDEPENDENT_AMBULATORY_CARE_PROVIDER_SITE_OTHER)
Admission: RE | Admit: 2012-09-05 | Discharge: 2012-09-05 | Disposition: A | Discharge status: Home / Self Care | Payer: BC Managed Care – PPO | Source: Ambulatory Visit | Attending: Internal Medicine | Admitting: Internal Medicine

## 2012-09-05 ENCOUNTER — Ambulatory Visit (INDEPENDENT_AMBULATORY_CARE_PROVIDER_SITE_OTHER): Payer: BC Managed Care – PPO | Admitting: Internal Medicine

## 2012-09-05 ENCOUNTER — Encounter: Payer: Self-pay | Admitting: Internal Medicine

## 2012-09-05 VITALS — BP 126/80 | HR 102 | Temp 97.7°F | Ht 60.0 in | Wt 240.6 lb

## 2012-09-05 DIAGNOSIS — R059 Cough, unspecified: Secondary | ICD-10-CM | POA: Insufficient documentation

## 2012-09-05 DIAGNOSIS — R05 Cough: Secondary | ICD-10-CM

## 2012-09-05 DIAGNOSIS — I10 Essential (primary) hypertension: Secondary | ICD-10-CM

## 2012-09-05 MED ORDER — OLMESARTAN MEDOXOMIL 40 MG PO TABS: 40.0000 mg | ORAL_TABLET | Freq: Every day | ORAL | Status: DC

## 2012-09-05 NOTE — Assessment & Plan Note (Signed)
ACE inhibitors are problematic in  pts with airway complaints because  even experienced pulmonologists can't always distinguish ace effects from copd/asthma/pnds/ allergies etc.  By themselves they don't actually cause a problem, much like oxygen can't by itself start a fire, but they certainly serve as a powerful catalyst or enhancer for any "fire"  or inflammatory process in the upper airway, be it caused by an ET  tube or more commonly reflux (especially in the obese or pts with known GERD or who are on biphoshonates) or URI's, due to interference with bradykinin clearance.  The effects of acei on bradykinin levels occurs in 100% of pt's on acei (unless they surreptitiously stop the med!) but the classic cough is only reported in 5%.  This leaves 95% of pts on acei's  with a variety of syndromes including no identifiable symptom in most  vs non-specific symptoms that wax and wane depending on what other insult is occuring at the level of the upper airway like GERD  rec trial off lisinopril and on benicar 40 mg daily x 4 weeks plus gerd rx then regroup

## 2012-09-05 NOTE — Assessment & Plan Note (Signed)
The most common causes of chronic cough in immunocompetent adults include the following: upper airway cough syndrome (UACS), previously referred to as postnasal drip syndrome (PNDS), which is caused by variety of rhinosinus conditions; (2) asthma; (3) GERD; (4) chronic bronchitis from cigarette smoking or other inhaled environmental irritants; (5) nonasthmatic eosinophilic bronchitis; and (6) bronchiectasis.   These conditions, singly or in combination, have accounted for up to 94% of the causes of chronic cough in prospective studies.   Other conditions have constituted no >6% of the causes in prospective studies These have included bronchogenic carcinoma, chronic interstitial pneumonia, sarcoidosis, left ventricular failure, ACEI-induced cough, and aspiration from a condition associated with pharyngeal dysfunction.    Chronic cough is often simultaneously caused by more than one condition. A single cause has been found from 38 to 82% of the time, multiple causes from 18 to 62%. Multiply caused cough has been the result of three diseases up to 42% of the time.      Most likely this is  Classic Upper airway cough syndrome, so named because it's frequently impossible to sort out how much is  CR/sinusitis with freq throat clearing (which can be related to primary GERD)   vs  causing  secondary (" extra esophageal")  GERD from wide swings in gastric pressure that occur with throat clearing, often  promoting self use of mint and menthol lozenges that reduce the lower esophageal sphincter tone and exacerbate the problem further in a cyclical fashion.   These are the same pts (now being labeled as having "irritable larynx syndrome" by some cough centers) who not infrequently have a history of having failed to tolerate ace inhibitors,  dry powder inhalers or biphosphonates or report having atypical reflux symptoms that don't respond to standard doses of PPI , and are easily confused as having aecopd or asthma  flares by even experienced allergists/ pulmonologists.   rec short term only rx of gerd and off acei x 4 weeks then regroup

## 2012-09-05 NOTE — Patient Instructions (Addendum)
Try prilosec 20mg   Take 30-60 min before first meal of the day and Pepcid 20 mg one bedtime until cough is completely gone for at least a week without the need for cough suppression (tessilon)  GERD (REFLUX)  is an extremely common cause of respiratory symptoms, many times with no significant heartburn at all.    It can be treated with medication, but also with lifestyle changes including avoidance of late meals, excessive alcohol, smoking cessation, and avoid fatty foods, chocolate, peppermint, colas, red wine, and acidic juices such as orange juice.  NO MINT OR MENTHOL PRODUCTS SO NO COUGH DROPS  USE SUGARLESS CANDY INSTEAD (jolley ranchers or Stover's)  NO OIL BASED VITAMINS - use powdered substitutes.    Stop lisinopril and start benicar 40 mg daily   Please remember to go to the x-ray department downstairs for your tests - we will call you with the results when they are available.    Please schedule a follow up office visit in 4 weeks, sooner if needed

## 2012-09-05 NOTE — Progress Notes (Signed)
Subjective:     Patient ID: [name] [name], female   DOB: [birth year]  MRN: [id number]  HPI   87 yowf  Never smoker never resp problems with new problem around 2000 with intemittent sinus problems assoc with coughing better never completely resolved referred 09/05/2012 to pulmonary clinic by [facility] for refractory cough.  09/05/2012 1st pulmonary eval /Yuktha Kerchner on acei x ? 15+ years  cc daily cough x 15 years sometimes with with flare of of rhnitis, sometimes with HB worse since early June 2014 and so hard gags/ vomits, day > night but also occurs when lie down at night.  Assoc with dysphagia and choking sensation, doe but not at rest unless coughing.  Really doesn't bring up much at all unless vomits/ also Pos for urinary incontinence. Only thing that seems to help is tessilon pearls   No obvious daytime variabilty or assoc chronic cough or cp or chest tightness, subjective wheeze overt sinus or hb symptoms. No unusual exp hx or h/o childhood pna/ asthma or knowledge of premature birth.    Also denies any obvious fluctuation of symptoms with weather or environmental changes or other aggravating or alleviating factors except as outlined above   Current Medications, Allergies, Past Medical History, Past Surgical History, Family History, and Social History were reviewed in [employer] record.  ROS  The following are not active complaints unless bolded sore throat, dysphagia, dental problems, itching, sneezing,  nasal congestion or excess/ purulent secretions, ear ache,   fever, chills, sweats, unintended wt loss, pleuritic or exertional cp, hemoptysis,  orthopnea pnd or leg swelling, presyncope, palpitations, heartburn, abdominal pain, anorexia, nausea, vomiting, diarrhea  or change in bowel or urinary habits, change in stools or urine, dysuria,hematuria,  rash, arthralgias, visual complaints, headache, numbness weakness or ataxia or problems with walking or coordination,  change in  mood/affect or memory.        Review of Systems     Objective:   Physical Exam  Obese wf nad / harsh barking cough Wt Readings from Last 3 Encounters:  09/05/12 240 lb 9.6 oz (109.135 kg)  12/09/11 278 lb (126.1 kg)    HEENT: edentulous, nl  turbinates, and orophanx. Nl external ear canals without cough reflex   NECK :  without JVD/Nodes/TM/ nl carotid upstrokes bilaterally   LUNGS: no acc muscle use, clear to A and P bilaterally without cough on insp or exp maneuvers   CV:  RRR  no s3 or murmur or increase in P2, no edema   ABD:  soft and nontender with nl excursion in the supine position. No bruits or organomegaly, bowel sounds nl  MS:  warm without deformities, calf tenderness, cyanosis or clubbing  SKIN: warm and dry without lesions    NEURO:  alert, approp, no deficits    CXR  09/05/2012 :    No acute infiltrate or pulmonary edema. Central mild bronchitic changes. Stable left basilar scarring. Mild degenerative changes thoracic spine again noted       Assessment:

## 2012-09-07 ENCOUNTER — Telehealth: Payer: Self-pay | Admitting: Internal Medicine

## 2012-09-07 NOTE — Telephone Encounter (Signed)
Notes Recorded by Christen Butter, CMA on 09/07/2012 at 11:32 AM LMTCB ------  Notes Recorded by Nyoka Cowden, MD on 09/05/2012 at 1:18 PM Call pt: Reviewed cxr and no acute change so no change in recommendations made at ov      Pt advised. Carron Curie, CMA

## 2012-09-26 ENCOUNTER — Other Ambulatory Visit: Payer: Self-pay | Admitting: Family Medicine

## 2012-09-26 DIAGNOSIS — M7989 Other specified soft tissue disorders: Secondary | ICD-10-CM

## 2012-10-03 ENCOUNTER — Ambulatory Visit (INDEPENDENT_AMBULATORY_CARE_PROVIDER_SITE_OTHER): Payer: BC Managed Care – PPO | Admitting: Internal Medicine

## 2012-10-03 ENCOUNTER — Encounter: Payer: Self-pay | Admitting: Internal Medicine

## 2012-10-03 VITALS — BP 130/76 | HR 95 | Temp 97.9°F | Ht 60.0 in | Wt 238.0 lb

## 2012-10-03 DIAGNOSIS — R05 Cough: Secondary | ICD-10-CM

## 2012-10-03 DIAGNOSIS — I10 Essential (primary) hypertension: Secondary | ICD-10-CM

## 2012-10-03 DIAGNOSIS — R059 Cough, unspecified: Secondary | ICD-10-CM

## 2012-10-03 NOTE — Progress Notes (Signed)
Subjective:     Patient ID: [name] [name], female   DOB: 08/26/1950  MRN: [id number]     Brief patient profile:  1 yowf  Never smoker never resp problems with new problem around 2000 with intemittent sinus problems assoc with coughing better never completely resolved referred 09/05/2012 to pulmonary clinic by [facility] for refractory cough.   HPI 09/05/2012 1st pulmonary eval /[name] [name] on acei x ? 15+ years  cc daily cough x 15 years sometimes with with flare of of rhnitis, sometimes with HB worse since early June 2014 and so hard gags/ vomits, day > night but also occurs when lie down at night.  Assoc with dysphagia and choking sensation, doe but not at rest unless coughing.  Really doesn't bring up much at all unless vomits/ also Pos for urinary incontinence. Only thing that seems to help is tessilon pearls  rec Try prilosec 20mg   Take 30-60 min before first meal of the day and Pepcid 20 mg one bedtime until cough is completely gone for at least a week without the need for cough suppression (tessilon) GERD diet Stop lisinopril and start benicar 40 mg daily     10/03/2012 f/u ov/[name] [name]  Chief Complaint  Patient presents with  . Follow-up    Cough is slighly improved. Still coughs from time to time.   Overall she really is much better, found clariton d helped the noc cough but hasn't tried off it since stopping the acei  No obvious daytime variabilty or assoc chronic cough or cp or chest tightness, subjective wheeze overt sinus or hb symptoms. No unusual exp hx or h/o childhood pna/ asthma or knowledge of premature birth.   Also denies any obvious fluctuation of symptoms with weather or environmental changes or other aggravating or alleviating factors except as outlined above   Current Medications, Allergies, Past Medical History, Past Surgical History, Family History, and Social History were reviewed in [employer] record.  ROS  The following are not active complaints  unless bolded sore throat, dysphagia, dental problems, itching, sneezing,  nasal congestion or excess/ purulent secretions, ear ache,   fever, chills, sweats, unintended wt loss, pleuritic or exertional cp, hemoptysis,  orthopnea pnd or leg swelling, presyncope, palpitations, heartburn, abdominal pain, anorexia, nausea, vomiting, diarrhea  or change in bowel or urinary habits, change in stools or urine, dysuria,hematuria,  rash, arthralgias, visual complaints, headache, numbness weakness or ataxia or problems with walking or coordination,  change in mood/affect or memory.              Objective:   Physical Exam  Obese wf nad / harsh barking cough gone but still occ throat clearing 10/03/2012         238 Wt Readings from Last 3 Encounters:  09/05/12 240 lb 9.6 oz (109.135 kg)  12/09/11 278 lb (126.1 kg)    HEENT: edentulous, nl  turbinates, and orophanx. Nl external ear canals without cough reflex   NECK :  without JVD/Nodes/TM/ nl carotid upstrokes bilaterally   LUNGS: no acc muscle use, clear to A and P bilaterally without cough on insp or exp maneuvers   CV:  RRR  no s3 or murmur or increase in P2, no edema   ABD:  soft and nontender with nl excursion in the supine position. No bruits or organomegaly, bowel sounds nl  MS:  warm without deformities, calf tenderness, cyanosis or clubbing  SKIN: warm and dry without lesions    NEURO:  alert, approp,  no deficits    CXR  09/05/2012 :    No acute infiltrate or pulmonary edema. Central mild bronchitic changes. Stable left basilar scarring. Mild degenerative changes thoracic spine again noted       Assessment:

## 2012-10-03 NOTE — Assessment & Plan Note (Signed)
Adequate control on present rx, reviewed > no change in rx needed  (keep off acei indefinitely given likelihood she also has pnds/gerd  Defer final choice of rx  to primary care > samples of benicar 40 mg one half daily given until she sees her primary next month.

## 2012-10-03 NOTE — Patient Instructions (Addendum)
Continue benicar 40 mg daily until you see Dr Jillyn Hidden (I would avoid the ace inhibitors as they like oxygen is to a smoldering fire)  Add pepcid ac (over the counter) 20 mg one at bedtime if night time cough recurs   If you are satisfied with your treatment plan let your doctor know and he/she can either refill your medications or you can return here when your prescription runs out.     If in any way you are not 100% satisfied,  please tell us.  If 100% better, tell your friends!

## 2012-10-03 NOTE — Assessment & Plan Note (Signed)
In retrospect this is  Classic Upper airway cough syndrome, so named because it's frequently impossible to sort out how much is  CR/sinusitis with freq throat clearing (which can be related to primary GERD)   vs  causing  secondary (" extra esophageal")  GERD from wide swings in gastric pressure that occur with throat clearing, often  promoting self use of mint and menthol lozenges that reduce the lower esophageal sphincter tone and exacerbate the problem further in a cyclical fashion.   These are the same pts (now being labeled as having "irritable larynx syndrome" by some cough centers) who not infrequently have a history of having failed to tolerate ace inhibitors,  dry powder inhalers or biphosphonates or report having atypical reflux symptoms that don't respond to standard doses of PPI , and are easily confused as having aecopd or asthma flares by even experienced allergists/ pulmonologists.   She should be able to just use the clariton d prn at this point and pulmonary f/u can be prn

## 2012-11-07 ENCOUNTER — Ambulatory Visit
Admission: RE | Admit: 2012-11-07 | Discharge: 2012-11-07 | Disposition: A | Discharge status: Home / Self Care | Payer: BC Managed Care – PPO | Source: Ambulatory Visit | Attending: Family Medicine | Admitting: Family Medicine

## 2012-11-07 DIAGNOSIS — M7989 Other specified soft tissue disorders: Secondary | ICD-10-CM

## 2013-01-04 ENCOUNTER — Ambulatory Visit
Admission: RE | Admit: 2013-01-04 | Discharge: 2013-01-04 | Disposition: A | Discharge status: Home / Self Care | Payer: Medicare Other | Source: Ambulatory Visit | Attending: Family Medicine | Admitting: Family Medicine

## 2013-01-04 ENCOUNTER — Other Ambulatory Visit: Payer: Self-pay | Admitting: Family Medicine

## 2013-01-04 DIAGNOSIS — M542 Cervicalgia: Secondary | ICD-10-CM

## 2013-01-04 DIAGNOSIS — R29818 Other symptoms and signs involving the nervous system: Secondary | ICD-10-CM | POA: Diagnosis not present

## 2013-01-04 DIAGNOSIS — S0993XA Unspecified injury of face, initial encounter: Secondary | ICD-10-CM | POA: Diagnosis not present

## 2013-01-04 DIAGNOSIS — R42 Dizziness and giddiness: Secondary | ICD-10-CM | POA: Diagnosis not present

## 2013-01-04 DIAGNOSIS — R51 Headache: Secondary | ICD-10-CM | POA: Diagnosis not present

## 2013-01-05 ENCOUNTER — Other Ambulatory Visit: Payer: Self-pay | Admitting: Family Medicine

## 2013-01-05 DIAGNOSIS — R42 Dizziness and giddiness: Secondary | ICD-10-CM

## 2013-01-08 ENCOUNTER — Ambulatory Visit
Admission: RE | Admit: 2013-01-08 | Discharge: 2013-01-08 | Disposition: A | Discharge status: Home / Self Care | Payer: Medicare Other | Source: Ambulatory Visit | Attending: Family Medicine | Admitting: Family Medicine

## 2013-01-08 DIAGNOSIS — R42 Dizziness and giddiness: Secondary | ICD-10-CM

## 2013-01-08 DIAGNOSIS — I10 Essential (primary) hypertension: Secondary | ICD-10-CM | POA: Diagnosis not present

## 2013-01-12 ENCOUNTER — Encounter (INDEPENDENT_AMBULATORY_CARE_PROVIDER_SITE_OTHER): Payer: Self-pay

## 2013-01-12 ENCOUNTER — Ambulatory Visit (INDEPENDENT_AMBULATORY_CARE_PROVIDER_SITE_OTHER): Payer: Medicare Other | Admitting: Neurology

## 2013-01-12 ENCOUNTER — Encounter: Payer: Self-pay | Admitting: Neurology

## 2013-01-12 VITALS — BP 140/88 | HR 106 | Temp 97.3°F | Ht 60.0 in | Wt 237.0 lb

## 2013-01-12 DIAGNOSIS — Z9181 History of falling: Secondary | ICD-10-CM

## 2013-01-12 DIAGNOSIS — E1149 Type 2 diabetes mellitus with other diabetic neurological complication: Secondary | ICD-10-CM

## 2013-01-12 DIAGNOSIS — M479 Spondylosis, unspecified: Secondary | ICD-10-CM | POA: Insufficient documentation

## 2013-01-12 DIAGNOSIS — E1142 Type 2 diabetes mellitus with diabetic polyneuropathy: Secondary | ICD-10-CM

## 2013-01-12 DIAGNOSIS — E66813 Obesity, class 3: Secondary | ICD-10-CM | POA: Insufficient documentation

## 2013-01-12 DIAGNOSIS — G4733 Obstructive sleep apnea (adult) (pediatric): Secondary | ICD-10-CM

## 2013-01-12 DIAGNOSIS — R269 Unspecified abnormalities of gait and mobility: Secondary | ICD-10-CM | POA: Diagnosis not present

## 2013-01-12 DIAGNOSIS — E1140 Type 2 diabetes mellitus with diabetic neuropathy, unspecified: Secondary | ICD-10-CM

## 2013-01-12 DIAGNOSIS — R2689 Other abnormalities of gait and mobility: Secondary | ICD-10-CM

## 2013-01-12 DIAGNOSIS — R296 Repeated falls: Secondary | ICD-10-CM

## 2013-01-12 DIAGNOSIS — M25561 Pain in right knee: Secondary | ICD-10-CM

## 2013-01-12 DIAGNOSIS — M25569 Pain in unspecified knee: Secondary | ICD-10-CM

## 2013-01-12 HISTORY — DX: Other abnormalities of gait and mobility: R26.89
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
## 2013-01-12 NOTE — Patient Instructions (Addendum)
I believe you have a gait disorder, which likely is due to a combination of things: normal aging, being overweight, degenerative arthritis of your back, and your knees, possible atherosclerosis of the blood vessels in your brain and medication that can affect your balance and neuropathy.   I will recommend [facility] PT for your balance; I will ask Dr. Jillyn Hidden to add that order. We will do a brain MRI. We will call you with the test results.   Remember to drink plenty of fluid, eat healthy meals and do not skip any meals. Try to eat protein with a every meal and eat a healthy snack such as fruit or nuts in between meals. Try to keep a regular sleep-wake schedule and try to exercise daily, particularly in the form of walking, 20-30 minutes a day, if you can. Change positions slowly and continue using a cane at all times.   As far as your medications are concerned, I would like to suggest no new medications.

## 2013-01-15 DIAGNOSIS — J00 Acute nasopharyngitis [common cold]: Secondary | ICD-10-CM | POA: Diagnosis not present

## 2013-01-15 DIAGNOSIS — B368 Other specified superficial mycoses: Secondary | ICD-10-CM | POA: Diagnosis not present

## 2013-01-22 DIAGNOSIS — S8000XA Contusion of unspecified knee, initial encounter: Secondary | ICD-10-CM | POA: Diagnosis not present

## 2013-01-22 DIAGNOSIS — M25569 Pain in unspecified knee: Secondary | ICD-10-CM | POA: Diagnosis not present

## 2013-01-22 DIAGNOSIS — M1710 Unilateral primary osteoarthritis, unspecified knee: Secondary | ICD-10-CM | POA: Diagnosis not present

## 2013-01-30 ENCOUNTER — Ambulatory Visit
Admission: RE | Admit: 2013-01-30 | Discharge: 2013-01-30 | Disposition: A | Discharge status: Home / Self Care | Payer: Medicare Other | Source: Ambulatory Visit | Attending: Neurology | Admitting: Neurology

## 2013-01-30 DIAGNOSIS — R269 Unspecified abnormalities of gait and mobility: Secondary | ICD-10-CM | POA: Diagnosis not present

## 2013-01-30 DIAGNOSIS — E1140 Type 2 diabetes mellitus with diabetic neuropathy, unspecified: Secondary | ICD-10-CM

## 2013-01-30 DIAGNOSIS — E1149 Type 2 diabetes mellitus with other diabetic neurological complication: Secondary | ICD-10-CM

## 2013-01-30 DIAGNOSIS — G4733 Obstructive sleep apnea (adult) (pediatric): Secondary | ICD-10-CM

## 2013-01-30 DIAGNOSIS — S0990XA Unspecified injury of head, initial encounter: Secondary | ICD-10-CM | POA: Diagnosis not present

## 2013-01-30 DIAGNOSIS — R2689 Other abnormalities of gait and mobility: Secondary | ICD-10-CM

## 2013-01-30 DIAGNOSIS — R296 Repeated falls: Secondary | ICD-10-CM

## 2013-02-05 NOTE — Progress Notes (Signed)
Quick Note:  Please call patient regarding the recent brain MRI: The brain scan showed a normal structure of the brain and no significant volume loss which we call atrophy. There were changes in the deeper structures of the brain, which we call white matter changes or microvascular changes. These were reported as very mild in Her case. These are tiny white spots, that occur with time and are seen in a variety of conditions, including with normal aging, chronic hypertension, chronic headaches, especially migraine HAs, chronic diabetes, chronic hyperlipidemia. These are not strokes and no mass or lesion was seen which is reassuring. Again, there were no acute findings, such as a stroke, or mass or blood products. No further action is required on this test at this time, other than re-enforcing the importance of good blood pressure control, good cholesterol control, good blood sugar control, and weight management. Please remind patient to keep any upcoming appointments or tests and to call us with any interim questions, concerns, problems or updates. Thanks,  Star Age, MD, PhD    ______

## 2013-03-07 DIAGNOSIS — M25569 Pain in unspecified knee: Secondary | ICD-10-CM | POA: Diagnosis not present

## 2013-03-07 DIAGNOSIS — S8000XA Contusion of unspecified knee, initial encounter: Secondary | ICD-10-CM | POA: Diagnosis not present

## 2013-03-07 DIAGNOSIS — M25519 Pain in unspecified shoulder: Secondary | ICD-10-CM | POA: Diagnosis not present

## 2013-03-07 DIAGNOSIS — M1710 Unilateral primary osteoarthritis, unspecified knee: Secondary | ICD-10-CM | POA: Diagnosis not present

## 2013-03-16 DIAGNOSIS — I10 Essential (primary) hypertension: Secondary | ICD-10-CM | POA: Diagnosis not present

## 2013-03-16 DIAGNOSIS — E669 Obesity, unspecified: Secondary | ICD-10-CM | POA: Diagnosis not present

## 2013-03-16 DIAGNOSIS — E119 Type 2 diabetes mellitus without complications: Secondary | ICD-10-CM | POA: Diagnosis not present

## 2013-03-16 DIAGNOSIS — H9190 Unspecified hearing loss, unspecified ear: Secondary | ICD-10-CM | POA: Diagnosis not present

## 2013-03-16 DIAGNOSIS — R29818 Other symptoms and signs involving the nervous system: Secondary | ICD-10-CM | POA: Diagnosis not present

## 2013-03-16 DIAGNOSIS — H905 Unspecified sensorineural hearing loss: Secondary | ICD-10-CM | POA: Diagnosis not present

## 2013-03-29 ENCOUNTER — Ambulatory Visit: Payer: BC Managed Care – PPO | Admitting: Physical Therapy

## 2013-04-03 DIAGNOSIS — F411 Generalized anxiety disorder: Secondary | ICD-10-CM | POA: Diagnosis not present

## 2013-04-04 ENCOUNTER — Ambulatory Visit: Payer: Medicare Other | Attending: Otolaryngology | Admitting: Rehabilitative and Restorative Service Providers"

## 2013-04-04 DIAGNOSIS — R269 Unspecified abnormalities of gait and mobility: Secondary | ICD-10-CM | POA: Diagnosis not present

## 2013-04-04 DIAGNOSIS — IMO0001 Reserved for inherently not codable concepts without codable children: Secondary | ICD-10-CM | POA: Diagnosis not present

## 2013-04-11 ENCOUNTER — Ambulatory Visit: Payer: Medicare Other | Admitting: Physical Therapy

## 2013-04-13 ENCOUNTER — Ambulatory Visit: Payer: Medicare Other | Admitting: Rehabilitative and Restorative Service Providers"

## 2013-04-18 ENCOUNTER — Ambulatory Visit: Payer: Medicare Other | Admitting: Rehabilitative and Restorative Service Providers"

## 2013-04-19 ENCOUNTER — Other Ambulatory Visit: Payer: Self-pay | Admitting: Family Medicine

## 2013-04-19 DIAGNOSIS — N61 Mastitis without abscess: Secondary | ICD-10-CM

## 2013-04-20 ENCOUNTER — Ambulatory Visit: Payer: Medicare Other | Admitting: Rehabilitative and Restorative Service Providers"

## 2013-04-25 ENCOUNTER — Ambulatory Visit: Payer: Medicare Other | Admitting: Rehabilitative and Restorative Service Providers"

## 2013-04-26 ENCOUNTER — Ambulatory Visit
Admission: RE | Admit: 2013-04-26 | Discharge: 2013-04-26 | Disposition: A | Discharge status: Home / Self Care | Payer: Medicare Other | Source: Ambulatory Visit | Attending: Family Medicine | Admitting: Family Medicine

## 2013-04-26 DIAGNOSIS — N61 Mastitis without abscess: Secondary | ICD-10-CM

## 2013-04-26 DIAGNOSIS — N6009 Solitary cyst of unspecified breast: Secondary | ICD-10-CM | POA: Diagnosis not present

## 2013-04-27 ENCOUNTER — Encounter: Payer: BC Managed Care – PPO | Admitting: Rehabilitative and Restorative Service Providers"

## 2013-04-30 ENCOUNTER — Ambulatory Visit: Payer: Medicare Other | Admitting: Rehabilitative and Restorative Service Providers"

## 2013-05-03 ENCOUNTER — Ambulatory Visit: Payer: Medicare Other | Attending: Otolaryngology | Admitting: Rehabilitative and Restorative Service Providers"

## 2013-05-03 DIAGNOSIS — IMO0001 Reserved for inherently not codable concepts without codable children: Secondary | ICD-10-CM | POA: Insufficient documentation

## 2013-05-03 DIAGNOSIS — R269 Unspecified abnormalities of gait and mobility: Secondary | ICD-10-CM | POA: Insufficient documentation

## 2013-05-11 ENCOUNTER — Ambulatory Visit: Payer: Medicare Other | Admitting: Rehabilitative and Restorative Service Providers"

## 2013-05-11 DIAGNOSIS — IMO0001 Reserved for inherently not codable concepts without codable children: Secondary | ICD-10-CM | POA: Diagnosis not present

## 2013-05-11 DIAGNOSIS — R269 Unspecified abnormalities of gait and mobility: Secondary | ICD-10-CM | POA: Diagnosis not present

## 2013-05-14 ENCOUNTER — Ambulatory Visit: Payer: Medicare Other | Admitting: Rehabilitative and Restorative Service Providers"

## 2013-05-14 DIAGNOSIS — R269 Unspecified abnormalities of gait and mobility: Secondary | ICD-10-CM | POA: Diagnosis not present

## 2013-05-14 DIAGNOSIS — IMO0001 Reserved for inherently not codable concepts without codable children: Secondary | ICD-10-CM | POA: Diagnosis not present

## 2013-05-16 ENCOUNTER — Encounter: Payer: Medicare Other | Admitting: Rehabilitative and Restorative Service Providers"

## 2013-05-22 ENCOUNTER — Encounter: Payer: Medicare Other | Admitting: Rehabilitative and Restorative Service Providers"

## 2013-05-25 ENCOUNTER — Ambulatory Visit (INDEPENDENT_AMBULATORY_CARE_PROVIDER_SITE_OTHER): Payer: Medicare Other | Admitting: Neurology

## 2013-05-25 ENCOUNTER — Ambulatory Visit: Payer: Medicare Other | Admitting: Rehabilitative and Restorative Service Providers"

## 2013-05-25 ENCOUNTER — Encounter: Payer: Self-pay | Admitting: Neurology

## 2013-05-25 VITALS — BP 162/92 | HR 88 | Temp 97.0°F | Ht 64.0 in | Wt 223.0 lb

## 2013-05-25 DIAGNOSIS — E1142 Type 2 diabetes mellitus with diabetic polyneuropathy: Secondary | ICD-10-CM

## 2013-05-25 DIAGNOSIS — G4733 Obstructive sleep apnea (adult) (pediatric): Secondary | ICD-10-CM

## 2013-05-25 DIAGNOSIS — M479 Spondylosis, unspecified: Secondary | ICD-10-CM | POA: Diagnosis not present

## 2013-05-25 DIAGNOSIS — E1149 Type 2 diabetes mellitus with other diabetic neurological complication: Secondary | ICD-10-CM | POA: Diagnosis not present

## 2013-05-25 DIAGNOSIS — R2689 Other abnormalities of gait and mobility: Secondary | ICD-10-CM

## 2013-05-25 DIAGNOSIS — Z9989 Dependence on other enabling machines and devices: Secondary | ICD-10-CM

## 2013-05-25 DIAGNOSIS — R269 Unspecified abnormalities of gait and mobility: Secondary | ICD-10-CM | POA: Diagnosis not present

## 2013-05-25 DIAGNOSIS — E1140 Type 2 diabetes mellitus with diabetic neuropathy, unspecified: Secondary | ICD-10-CM

## 2013-05-25 DIAGNOSIS — IMO0001 Reserved for inherently not codable concepts without codable children: Secondary | ICD-10-CM | POA: Diagnosis not present

## 2013-05-25 NOTE — Patient Instructions (Signed)
Continue using your walker, especially when leaving the house.  Follow instructions provided by your physical therapist.  I will see you back in 6 month.  I think you are doing much better, keep up the good work.
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
## 2013-05-28 DIAGNOSIS — E1149 Type 2 diabetes mellitus with other diabetic neurological complication: Secondary | ICD-10-CM | POA: Diagnosis not present

## 2013-05-28 DIAGNOSIS — E785 Hyperlipidemia, unspecified: Secondary | ICD-10-CM | POA: Diagnosis not present

## 2013-05-28 DIAGNOSIS — M109 Gout, unspecified: Secondary | ICD-10-CM | POA: Diagnosis not present

## 2013-05-28 DIAGNOSIS — E039 Hypothyroidism, unspecified: Secondary | ICD-10-CM | POA: Diagnosis not present

## 2013-05-28 DIAGNOSIS — M1710 Unilateral primary osteoarthritis, unspecified knee: Secondary | ICD-10-CM | POA: Diagnosis not present

## 2013-05-28 DIAGNOSIS — Z79899 Other long term (current) drug therapy: Secondary | ICD-10-CM | POA: Diagnosis not present

## 2013-05-28 DIAGNOSIS — IMO0002 Reserved for concepts with insufficient information to code with codable children: Secondary | ICD-10-CM | POA: Diagnosis not present

## 2013-05-28 DIAGNOSIS — R197 Diarrhea, unspecified: Secondary | ICD-10-CM | POA: Diagnosis not present

## 2013-05-29 ENCOUNTER — Encounter: Payer: Medicare Other | Admitting: Rehabilitative and Restorative Service Providers"

## 2013-05-31 ENCOUNTER — Ambulatory Visit: Payer: Medicare Other | Admitting: Rehabilitative and Restorative Service Providers"

## 2013-05-31 DIAGNOSIS — F411 Generalized anxiety disorder: Secondary | ICD-10-CM | POA: Diagnosis not present

## 2013-06-01 ENCOUNTER — Encounter: Payer: Medicare Other | Admitting: Rehabilitative and Restorative Service Providers"

## 2013-06-07 DIAGNOSIS — G9009 Other idiopathic peripheral autonomic neuropathy: Secondary | ICD-10-CM | POA: Diagnosis not present

## 2013-06-07 DIAGNOSIS — L84 Corns and callosities: Secondary | ICD-10-CM | POA: Diagnosis not present

## 2013-06-07 DIAGNOSIS — E1149 Type 2 diabetes mellitus with other diabetic neurological complication: Secondary | ICD-10-CM | POA: Diagnosis not present

## 2013-06-07 DIAGNOSIS — R262 Difficulty in walking, not elsewhere classified: Secondary | ICD-10-CM | POA: Diagnosis not present

## 2013-08-02 DIAGNOSIS — E1149 Type 2 diabetes mellitus with other diabetic neurological complication: Secondary | ICD-10-CM | POA: Diagnosis not present

## 2013-08-02 DIAGNOSIS — L84 Corns and callosities: Secondary | ICD-10-CM | POA: Diagnosis not present

## 2013-08-02 DIAGNOSIS — L608 Other nail disorders: Secondary | ICD-10-CM | POA: Diagnosis not present

## 2013-08-30 DIAGNOSIS — F411 Generalized anxiety disorder: Secondary | ICD-10-CM | POA: Diagnosis not present

## 2013-10-04 DIAGNOSIS — E1149 Type 2 diabetes mellitus with other diabetic neurological complication: Secondary | ICD-10-CM | POA: Diagnosis not present

## 2013-10-04 DIAGNOSIS — L608 Other nail disorders: Secondary | ICD-10-CM | POA: Diagnosis not present

## 2013-10-04 DIAGNOSIS — L84 Corns and callosities: Secondary | ICD-10-CM | POA: Diagnosis not present

## 2013-11-01 DIAGNOSIS — R109 Unspecified abdominal pain: Secondary | ICD-10-CM | POA: Diagnosis not present

## 2013-11-01 DIAGNOSIS — R110 Nausea: Secondary | ICD-10-CM | POA: Diagnosis not present

## 2013-11-02 ENCOUNTER — Ambulatory Visit
Admission: RE | Admit: 2013-11-02 | Discharge: 2013-11-02 | Disposition: A | Discharge status: Home / Self Care | Payer: Medicare Other | Source: Ambulatory Visit | Attending: Family | Admitting: Family

## 2013-11-02 ENCOUNTER — Other Ambulatory Visit: Payer: Self-pay | Admitting: Family

## 2013-11-02 DIAGNOSIS — R1032 Left lower quadrant pain: Secondary | ICD-10-CM | POA: Diagnosis not present

## 2013-11-02 DIAGNOSIS — K429 Umbilical hernia without obstruction or gangrene: Secondary | ICD-10-CM | POA: Diagnosis not present

## 2013-11-02 DIAGNOSIS — K5730 Diverticulosis of large intestine without perforation or abscess without bleeding: Secondary | ICD-10-CM | POA: Diagnosis not present

## 2013-11-02 DIAGNOSIS — R109 Unspecified abdominal pain: Secondary | ICD-10-CM

## 2013-11-06 DIAGNOSIS — R932 Abnormal findings on diagnostic imaging of liver and biliary tract: Secondary | ICD-10-CM | POA: Diagnosis not present

## 2013-11-06 DIAGNOSIS — K5792 Diverticulitis of intestine, part unspecified, without perforation or abscess without bleeding: Secondary | ICD-10-CM | POA: Diagnosis not present

## 2013-11-06 DIAGNOSIS — G629 Polyneuropathy, unspecified: Secondary | ICD-10-CM | POA: Diagnosis not present

## 2013-11-06 DIAGNOSIS — R935 Abnormal findings on diagnostic imaging of other abdominal regions, including retroperitoneum: Secondary | ICD-10-CM | POA: Diagnosis not present

## 2013-11-06 DIAGNOSIS — G4701 Insomnia due to medical condition: Secondary | ICD-10-CM | POA: Diagnosis not present

## 2013-11-06 DIAGNOSIS — E119 Type 2 diabetes mellitus without complications: Secondary | ICD-10-CM | POA: Diagnosis not present

## 2013-11-20 DIAGNOSIS — F332 Major depressive disorder, recurrent severe without psychotic features: Secondary | ICD-10-CM | POA: Diagnosis not present

## 2013-11-20 DIAGNOSIS — E119 Type 2 diabetes mellitus without complications: Secondary | ICD-10-CM | POA: Diagnosis not present

## 2013-11-26 ENCOUNTER — Encounter: Payer: Self-pay | Admitting: Neurology

## 2013-11-26 ENCOUNTER — Ambulatory Visit (INDEPENDENT_AMBULATORY_CARE_PROVIDER_SITE_OTHER): Payer: Medicare Other | Admitting: Neurology

## 2013-11-26 VITALS — BP 139/87 | HR 95 | Ht 61.0 in | Wt 215.0 lb

## 2013-11-26 DIAGNOSIS — E0842 Diabetes mellitus due to underlying condition with diabetic polyneuropathy: Secondary | ICD-10-CM

## 2013-11-26 DIAGNOSIS — R2689 Other abnormalities of gait and mobility: Secondary | ICD-10-CM | POA: Diagnosis not present

## 2013-11-26 DIAGNOSIS — M47816 Spondylosis without myelopathy or radiculopathy, lumbar region: Secondary | ICD-10-CM
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
## 2013-11-27 DIAGNOSIS — R1011 Right upper quadrant pain: Secondary | ICD-10-CM | POA: Diagnosis not present

## 2013-11-27 DIAGNOSIS — K5792 Diverticulitis of intestine, part unspecified, without perforation or abscess without bleeding: Secondary | ICD-10-CM | POA: Diagnosis not present

## 2013-11-27 DIAGNOSIS — M792 Neuralgia and neuritis, unspecified: Secondary | ICD-10-CM | POA: Diagnosis not present

## 2013-11-27 DIAGNOSIS — F419 Anxiety disorder, unspecified: Secondary | ICD-10-CM | POA: Diagnosis not present

## 2013-11-27 DIAGNOSIS — R1012 Left upper quadrant pain: Secondary | ICD-10-CM | POA: Diagnosis not present

## 2013-11-28 ENCOUNTER — Other Ambulatory Visit (HOSPITAL_COMMUNITY): Payer: Self-pay | Admitting: Gastroenterology

## 2013-11-28 ENCOUNTER — Other Ambulatory Visit: Payer: Self-pay | Admitting: Gastroenterology

## 2013-11-28 DIAGNOSIS — R194 Change in bowel habit: Secondary | ICD-10-CM | POA: Diagnosis not present

## 2013-11-28 DIAGNOSIS — R935 Abnormal findings on diagnostic imaging of other abdominal regions, including retroperitoneum: Secondary | ICD-10-CM

## 2013-11-28 DIAGNOSIS — R1032 Left lower quadrant pain: Secondary | ICD-10-CM | POA: Diagnosis not present

## 2013-11-28 DIAGNOSIS — R9389 Abnormal findings on diagnostic imaging of other specified body structures: Secondary | ICD-10-CM

## 2013-11-28 DIAGNOSIS — Z1211 Encounter for screening for malignant neoplasm of colon: Secondary | ICD-10-CM | POA: Diagnosis not present

## 2013-11-29 DIAGNOSIS — F419 Anxiety disorder, unspecified: Secondary | ICD-10-CM | POA: Diagnosis not present

## 2013-12-04 ENCOUNTER — Other Ambulatory Visit: Payer: Medicare Other

## 2013-12-13 DIAGNOSIS — F4323 Adjustment disorder with mixed anxiety and depressed mood: Secondary | ICD-10-CM | POA: Diagnosis not present

## 2013-12-13 DIAGNOSIS — F419 Anxiety disorder, unspecified: Secondary | ICD-10-CM | POA: Diagnosis not present

## 2013-12-25 ENCOUNTER — Ambulatory Visit (HOSPITAL_COMMUNITY)
Admission: RE | Admit: 2013-12-25 | Discharge: 2013-12-25 | Disposition: A | Discharge status: Home / Self Care | Payer: Medicare Other | Source: Ambulatory Visit | Attending: Gastroenterology | Admitting: Gastroenterology

## 2013-12-25 DIAGNOSIS — R9389 Abnormal findings on diagnostic imaging of other specified body structures: Secondary | ICD-10-CM

## 2013-12-25 DIAGNOSIS — Z9889 Other specified postprocedural states: Secondary | ICD-10-CM | POA: Diagnosis not present

## 2013-12-25 DIAGNOSIS — Z9049 Acquired absence of other specified parts of digestive tract: Secondary | ICD-10-CM | POA: Insufficient documentation

## 2013-12-25 DIAGNOSIS — K7460 Unspecified cirrhosis of liver: Secondary | ICD-10-CM | POA: Diagnosis not present

## 2013-12-25 DIAGNOSIS — R935 Abnormal findings on diagnostic imaging of other abdominal regions, including retroperitoneum: Secondary | ICD-10-CM

## 2013-12-25 DIAGNOSIS — R161 Splenomegaly, not elsewhere classified: Secondary | ICD-10-CM | POA: Diagnosis not present

## 2013-12-31 DIAGNOSIS — F419 Anxiety disorder, unspecified: Secondary | ICD-10-CM | POA: Diagnosis not present

## 2013-12-31 DIAGNOSIS — F4323 Adjustment disorder with mixed anxiety and depressed mood: Secondary | ICD-10-CM | POA: Diagnosis not present

## 2014-01-01 DIAGNOSIS — R262 Difficulty in walking, not elsewhere classified: Secondary | ICD-10-CM | POA: Diagnosis not present

## 2014-01-01 DIAGNOSIS — E1142 Type 2 diabetes mellitus with diabetic polyneuropathy: Secondary | ICD-10-CM | POA: Diagnosis not present

## 2014-01-01 DIAGNOSIS — M76822 Posterior tibial tendinitis, left leg: Secondary | ICD-10-CM | POA: Diagnosis not present

## 2014-01-01 DIAGNOSIS — M1009 Idiopathic gout, multiple sites: Secondary | ICD-10-CM | POA: Diagnosis not present

## 2014-02-07 DIAGNOSIS — F419 Anxiety disorder, unspecified: Secondary | ICD-10-CM | POA: Diagnosis not present

## 2014-02-07 DIAGNOSIS — F4323 Adjustment disorder with mixed anxiety and depressed mood: Secondary | ICD-10-CM | POA: Diagnosis not present

## 2014-02-25 DIAGNOSIS — K7581 Nonalcoholic steatohepatitis (NASH): Secondary | ICD-10-CM | POA: Diagnosis not present

## 2014-02-25 DIAGNOSIS — R634 Abnormal weight loss: Secondary | ICD-10-CM | POA: Diagnosis not present

## 2014-02-25 DIAGNOSIS — R110 Nausea: Secondary | ICD-10-CM | POA: Diagnosis not present

## 2014-02-25 DIAGNOSIS — Z1211 Encounter for screening for malignant neoplasm of colon: Secondary | ICD-10-CM | POA: Diagnosis not present

## 2014-02-25 DIAGNOSIS — R109 Unspecified abdominal pain: Secondary | ICD-10-CM | POA: Diagnosis not present

## 2014-02-25 DIAGNOSIS — R197 Diarrhea, unspecified: Secondary | ICD-10-CM | POA: Diagnosis not present

## 2014-02-25 DIAGNOSIS — K7460 Unspecified cirrhosis of liver: Secondary | ICD-10-CM | POA: Diagnosis not present

## 2014-03-01 DIAGNOSIS — K7460 Unspecified cirrhosis of liver: Secondary | ICD-10-CM | POA: Diagnosis not present

## 2014-03-01 DIAGNOSIS — Z23 Encounter for immunization: Secondary | ICD-10-CM | POA: Diagnosis not present

## 2014-03-01 DIAGNOSIS — R197 Diarrhea, unspecified: Secondary | ICD-10-CM | POA: Diagnosis not present

## 2014-03-05 DIAGNOSIS — M76822 Posterior tibial tendinitis, left leg: Secondary | ICD-10-CM | POA: Diagnosis not present

## 2014-03-05 DIAGNOSIS — M76821 Posterior tibial tendinitis, right leg: Secondary | ICD-10-CM | POA: Diagnosis not present

## 2014-03-05 DIAGNOSIS — R262 Difficulty in walking, not elsewhere classified: Secondary | ICD-10-CM | POA: Diagnosis not present

## 2014-03-05 DIAGNOSIS — E1142 Type 2 diabetes mellitus with diabetic polyneuropathy: Secondary | ICD-10-CM | POA: Diagnosis not present

## 2014-03-07 DIAGNOSIS — R110 Nausea: Secondary | ICD-10-CM | POA: Diagnosis not present

## 2014-03-07 DIAGNOSIS — R1013 Epigastric pain: Secondary | ICD-10-CM | POA: Diagnosis not present

## 2014-03-07 DIAGNOSIS — Z1211 Encounter for screening for malignant neoplasm of colon: Secondary | ICD-10-CM | POA: Diagnosis not present

## 2014-03-07 DIAGNOSIS — R634 Abnormal weight loss: Secondary | ICD-10-CM | POA: Diagnosis not present

## 2014-04-01 DIAGNOSIS — K7460 Unspecified cirrhosis of liver: Secondary | ICD-10-CM | POA: Diagnosis not present

## 2014-04-01 DIAGNOSIS — Z23 Encounter for immunization: Secondary | ICD-10-CM | POA: Diagnosis not present

## 2014-04-04 DIAGNOSIS — M76822 Posterior tibial tendinitis, left leg: Secondary | ICD-10-CM | POA: Diagnosis not present

## 2014-04-04 DIAGNOSIS — E1142 Type 2 diabetes mellitus with diabetic polyneuropathy: Secondary | ICD-10-CM | POA: Diagnosis not present

## 2014-04-04 DIAGNOSIS — R262 Difficulty in walking, not elsewhere classified: Secondary | ICD-10-CM | POA: Diagnosis not present

## 2014-04-04 DIAGNOSIS — M76821 Posterior tibial tendinitis, right leg: Secondary | ICD-10-CM | POA: Diagnosis not present

## 2014-04-09 DIAGNOSIS — K591 Functional diarrhea: Secondary | ICD-10-CM | POA: Diagnosis not present

## 2014-04-09 DIAGNOSIS — K7581 Nonalcoholic steatohepatitis (NASH): Secondary | ICD-10-CM | POA: Diagnosis not present

## 2014-04-16 DIAGNOSIS — F419 Anxiety disorder, unspecified: Secondary | ICD-10-CM | POA: Diagnosis not present

## 2014-04-16 DIAGNOSIS — Z887 Allergy status to serum and vaccine status: Secondary | ICD-10-CM | POA: Diagnosis not present

## 2014-04-16 DIAGNOSIS — E1143 Type 2 diabetes mellitus with diabetic autonomic (poly)neuropathy: Secondary | ICD-10-CM | POA: Diagnosis not present

## 2014-04-16 DIAGNOSIS — K529 Noninfective gastroenteritis and colitis, unspecified: Secondary | ICD-10-CM | POA: Diagnosis not present

## 2014-05-06 DIAGNOSIS — M545 Low back pain: Secondary | ICD-10-CM | POA: Diagnosis not present

## 2014-05-15 ENCOUNTER — Other Ambulatory Visit: Payer: Self-pay | Admitting: Orthopaedic Surgery

## 2014-05-15 DIAGNOSIS — M545 Low back pain: Secondary | ICD-10-CM

## 2014-05-25 ENCOUNTER — Other Ambulatory Visit: Payer: BC Managed Care – PPO

## 2014-05-28 ENCOUNTER — Ambulatory Visit: Payer: Medicare Other | Admitting: Neurology

## 2014-06-20 DIAGNOSIS — G4730 Sleep apnea, unspecified: Secondary | ICD-10-CM | POA: Diagnosis not present

## 2014-06-20 DIAGNOSIS — R109 Unspecified abdominal pain: Secondary | ICD-10-CM | POA: Diagnosis not present

## 2014-06-20 DIAGNOSIS — E1143 Type 2 diabetes mellitus with diabetic autonomic (poly)neuropathy: Secondary | ICD-10-CM | POA: Diagnosis not present

## 2014-06-20 DIAGNOSIS — L03032 Cellulitis of left toe: Secondary | ICD-10-CM | POA: Diagnosis not present

## 2014-06-20 DIAGNOSIS — R5383 Other fatigue: Secondary | ICD-10-CM | POA: Diagnosis not present

## 2014-07-04 DIAGNOSIS — D696 Thrombocytopenia, unspecified: Secondary | ICD-10-CM | POA: Diagnosis not present

## 2014-07-30 ENCOUNTER — Encounter: Payer: Self-pay | Admitting: Hematology and Oncology

## 2014-07-30 ENCOUNTER — Encounter (INDEPENDENT_AMBULATORY_CARE_PROVIDER_SITE_OTHER): Payer: Self-pay

## 2014-07-30 ENCOUNTER — Inpatient Hospital Stay: Payer: Medicare Other | Attending: Hematology and Oncology | Admitting: Hematology and Oncology

## 2014-07-30 VITALS — BP 118/77 | HR 84 | Temp 97.2°F | Resp 18 | Ht 62.0 in | Wt 235.2 lb

## 2014-07-30 DIAGNOSIS — E119 Type 2 diabetes mellitus without complications: Secondary | ICD-10-CM | POA: Diagnosis not present

## 2014-07-30 DIAGNOSIS — E079 Disorder of thyroid, unspecified: Secondary | ICD-10-CM | POA: Insufficient documentation

## 2014-07-30 DIAGNOSIS — D696 Thrombocytopenia, unspecified: Secondary | ICD-10-CM | POA: Diagnosis present

## 2014-07-30 DIAGNOSIS — R634 Abnormal weight loss: Secondary | ICD-10-CM | POA: Insufficient documentation

## 2014-07-30 DIAGNOSIS — G4730 Sleep apnea, unspecified: Secondary | ICD-10-CM | POA: Diagnosis not present

## 2014-07-30 DIAGNOSIS — R51 Headache: Secondary | ICD-10-CM | POA: Insufficient documentation

## 2014-07-30 DIAGNOSIS — Z8669 Personal history of other diseases of the nervous system and sense organs: Secondary | ICD-10-CM | POA: Insufficient documentation

## 2014-07-30 DIAGNOSIS — I10 Essential (primary) hypertension: Secondary | ICD-10-CM | POA: Diagnosis not present

## 2014-07-30 DIAGNOSIS — D649 Anemia, unspecified: Secondary | ICD-10-CM | POA: Insufficient documentation

## 2014-07-30 DIAGNOSIS — F419 Anxiety disorder, unspecified: Secondary | ICD-10-CM | POA: Diagnosis not present

## 2014-07-30 DIAGNOSIS — R161 Splenomegaly, not elsewhere classified: Secondary | ICD-10-CM | POA: Diagnosis not present

## 2014-07-30 DIAGNOSIS — G629 Polyneuropathy, unspecified: Secondary | ICD-10-CM | POA: Insufficient documentation

## 2014-07-30 DIAGNOSIS — K5790 Diverticulosis of intestine, part unspecified, without perforation or abscess without bleeding: Secondary | ICD-10-CM | POA: Diagnosis not present

## 2014-07-30 DIAGNOSIS — Z79899 Other long term (current) drug therapy: Secondary | ICD-10-CM | POA: Diagnosis not present

## 2014-07-30 DIAGNOSIS — R5383 Other fatigue: Secondary | ICD-10-CM | POA: Diagnosis not present

## 2014-07-30 DIAGNOSIS — Z8639 Personal history of other endocrine, nutritional and metabolic disease: Secondary | ICD-10-CM | POA: Diagnosis not present

## 2014-07-30 DIAGNOSIS — R61 Generalized hyperhidrosis: Secondary | ICD-10-CM

## 2014-07-30 NOTE — Progress Notes (Signed)
[facility] day:  07/30/2014  Chief Complaint: [name] [name] is a 64 y.o. female with thrombocytopenia who is referred in consultation by Dr. Chapman Fitch for assessment and management.  HPI: The patient states that she has been followed by Dr. Chapman Fitch in the past 6 years up. In 06/2014 she was seen symptoms of fatigue "not being able to focus, cold headache or. Dates that it was discovered that her platelet count was low. She had a recheck of her platelet count 2 weeks later. She states that she has bruised easily for the past 2 years.  She notes that she has lost between 40 and 45 pounds in the past 2 years after starting Victoza for diabetes. Her weight has leveled off at. She has some fever "sometimes". She has some sweats. She notes a fever blisters.  She notes headaches if her "heartbeats fast". She denies any visual changes, sore throat or cough.  She notes having abdominal problems for a "long time". She  Is followed by Dr. Cristina Gong.  She had a colonoscopy in either February or April 2016 (negative).  Abdominal and pelvic CT scan on 11/02/2013 revealed colonic diverticulosis and a possible area of uncomplicated diverticulitis, mild nodularity of the hepatic contour with mild hypertrophy of the caudate lobe.  Spleen was enlarged (15.5 cm).  Findings were worrisome for hepatic cirrhosis and portal hypertension. Liver duplex on 12/25/2013 revealed patent portal, hepatic, and splenic veins.  She notes no new medications or herbal products.  She does not drink quinine water. He has no family history of any blood disorders or autoimmune problems. She has never had a blood transfusion or hepatitis. She states that she completed the hepatitis B series in 2016.  Available labs from 11/30/2011 until 07/04/2014 reveal chronic mild thrombocytopenia (98,000 to 124,000) without trend.  CBC on 07/04/2014 included a hematocrit of 37.9, hemoglobin 12.5, MCV 84.6, platelets 102,000  and white count 4600. No differential is available.  Labs on 06/20/2014 inclluded a hematocrit 36.4, hemoglobin 12.1, platelets 94,000, white count 4200 with an ANC of 2400. Differential was unremarkable.  PTT was 11.9 with an INR of 1.1 on 02/25/2014. Comprehensive metabolic panel on 14/48/1856 was normal with a calcium of 10.3, protein 7.3, and albumin 4.9.  Liver function tests were normal. Hepatitis A testing was negative on 02/25/2014. Ferritin was 86.6 on 11/28/2013 with a normal iron saturation of 20%. ANA was negative. Hepatitis B core antibody total and hepatitis C antibody was negative on 11/28/2013. TSH was normal on 05/28/2013. Uric acid was 5.0 on 05/28/2013.  Past Medical History  Diagnosis Date  . Diabetes mellitus without complication   . Thyroid disease   . Gout   . Neuropathy   . Hypertension   . Sleep apnea   . Cough   . Migraine   . Anxiety   . 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 Cholecystectomy    . Joint replacement    . Tubal ligation      Family History  Problem Relation Age of Onset  . Bone cancer Paternal Grandmother   . Heart failure Paternal Grandmother   . Cancer Maternal Grandmother   . Heart failure Maternal Grandfather   . Cancer Sister     Social History:  reports that she has never smoked. She has never used smokeless tobacco. She reports that she does not drink alcohol or use illicit drugs.  The patient  is accompanied by her husband/boyfriend.  Allergies:  Allergies  Allergen Reactions  . Influenza A (H1n1) Monoval Pf Anaphylaxis  . Codeine Swelling    Current Medications: Current Outpatient Prescriptions  Medication Sig Dispense Refill  . allopurinol (ZYLOPRIM) 100 MG tablet Take 400 mg by mouth daily.    . benzonatate (TESSALON) 100 MG capsule Take 1 capsule by mouth 3 (three) times daily as needed.    . citalopram (CELEXA) 20 MG tablet Take 20 mg by mouth daily.    .  colchicine 0.6 MG tablet Take 0.6 mg by mouth daily. For gout flare up    . furosemide (LASIX) 20 MG tablet Take 20 mg by mouth daily as needed. For fluid/swelling    . levothyroxine (SYNTHROID, LEVOTHROID) 200 MCG tablet Take 200 mcg by mouth daily.    . Liraglutide (VICTOZA) 18 MG/3ML SOPN Inject 1.8 mg into the skin daily.    [name] loperamide (IMODIUM) 2 MG capsule Take 2 mg by mouth as needed for diarrhea or loose stools.    [name] loratadine (CLARITIN) 10 MG tablet Take 10 mg by mouth daily.    [name] LORazepam (ATIVAN) 1 MG tablet     . losartan (COZAAR) 50 MG tablet Take 1 tablet by mouth daily.    . naproxen (NAPROSYN) 500 MG tablet     . pregabalin (LYRICA) 50 MG capsule Take 50 mg by mouth 2 (two) times daily. Lender take up to 3 if needed.    . Liraglutide (VICTOZA [place]) Inject into the skin daily.    . metFORMIN (GLUCOPHAGE XR) 500 MG 24 hr tablet Take 1,000 mg by mouth.     No current facility-administered medications for this visit.    Review of Systems:  GENERAL:  Feels "ok".  Some fever and sweats.  Weight loss of 40-45# in the past 2 years. PERFORMANCE STATUS (ECOG):  1 HEENT:  No visual changes, runny nose, sore throat, mouth sores or tenderness. Lungs: No shortness of breath or cough.  No hemoptysis. Cardiac:  No chest pain, palpitations, orthopnea, or PND. GI:  Abdominal symptoms for "awhile".  Gassy.  No nausea, vomiting, diarrhea, constipation, melena or hematochezia. GU:  No urgency, frequency, dysuria, or hematuria. Musculoskeletal:  Arthritis in knees, ankles, feet, and hands.  No back pain.  No muscle tenderness. Extremities:  No pain or swelling. Skin:  After bath itching "for awhile".  No rashes or skin changes. Neuro:  Head ache with tachycardia.  No numbness or weakness, balance or coordination issues. Endocrine:  Diabetes.  Thyroid disease.  No hot flashes or night sweats. Psych:  No mood changes, depression or anxiety. Pain:  No focal pain. Review of systems:  All other  systems reviewed and found to be negative.   Physical Exam: Blood pressure 118/77, pulse 84, temperature 97.2 F (36.2 C), temperature source Tympanic, resp. rate 18, height 5' 2"  (1.575 m), weight 235 lb 3.7 oz (106.7 kg), SpO2 97 %.  GENERAL:  Well developed, well nourished, sitting comfortably in the exam room in no acute distress. MENTAL STATUS:  Alert and oriented to person, place and time. HEAD:  Long graying hair.  Normocephalic, atraumatic, face symmetric, no Cushingoid features. EYES:  Glasses.  Brown eyes.  Pupils equal round and reactive to light and accomodation.  No conjunctivitis or scleral icterus. ENT:  Oropharynx clear without lesion.  Dentures.  Tongue normal. Mucous membranes moist.  RESPIRATORY:  Clear to auscultation without rales, wheezes or rhonchi. CARDIOVASCULAR:  Regular rate and rhythm without  murmur, rub or gallop. ABDOMEN:  Pannus.  Spleen tip palpable.  Soft, non-tender, with active bowel sounds, and no hepatomegaly.  No masses. SKIN:  Abdominal ecchymosis secondary to Victoza injections.  No rashes, ulcers or lesions. EXTREMITIES: Brawny lower extremities.  No palpable cords. LYMPH NODES: No palpable cervical, supraclavicular, axillary or inguinal adenopathy  NEUROLOGICAL: Unremarkable. PSYCH:  Appropriate.   No visits with results within 3 Day(s) from this visit. Latest known visit with results is:  Admission on 12/09/2011, Discharged on 12/10/2011  Component Date Value Ref Range Status  . WBC 12/09/2011 9.1  4.0 - 10.5 K/uL Final  . RBC 12/09/2011 3.94  3.87 - 5.11 MIL/uL Final  . Hemoglobin 12/09/2011 11.1* 12.0 - 15.0 g/dL Final  . HCT 12/09/2011 33.7* 36.0 - 46.0 % Final  . MCV 12/09/2011 85.5  78.0 - 100.0 fL Final  . MCH 12/09/2011 28.2  26.0 - 34.0 pg Final  . MCHC 12/09/2011 32.9  30.0 - 36.0 g/dL Final  . RDW 12/09/2011 15.0  11.5 - 15.5 % Final  . Platelets 12/09/2011 142* 150 - 400 K/uL Final  . Neutrophils Relative % 12/09/2011 67  43 - 77  % Final  . Neutro Abs 12/09/2011 6.1  1.7 - 7.7 K/uL Final  . Lymphocytes Relative 12/09/2011 22  12 - 46 % Final  . Lymphs Abs 12/09/2011 2.0  0.7 - 4.0 K/uL Final  . Monocytes Relative 12/09/2011 7  3 - 12 % Final  . Monocytes Absolute 12/09/2011 0.6  0.1 - 1.0 K/uL Final  . Eosinophils Relative 12/09/2011 4  0 - 5 % Final  . Eosinophils Absolute 12/09/2011 0.3  0.0 - 0.7 K/uL Final  . Basophils Relative 12/09/2011 1  0 - 1 % Final  . Basophils Absolute 12/09/2011 0.1  0.0 - 0.1 K/uL Final  . Color, Urine 12/09/2011 YELLOW  YELLOW Final  . APPearance 12/09/2011 CLEAR  CLEAR Final  . Specific Gravity, Urine 12/09/2011 1.027  1.005 - 1.030 Final  . pH 12/09/2011 6.0  5.0 - 8.0 Final  . Glucose, UA 12/09/2011 NEGATIVE  NEGATIVE mg/dL Final  . Hgb urine dipstick 12/09/2011 NEGATIVE  NEGATIVE Final  . Bilirubin Urine 12/09/2011 NEGATIVE  NEGATIVE Final  . Ketones, ur 12/09/2011 NEGATIVE  NEGATIVE mg/dL Final  . Protein, ur 12/09/2011 NEGATIVE  NEGATIVE mg/dL Final  . Urobilinogen, UA 12/09/2011 0.2  0.0 - 1.0 mg/dL Final  . Nitrite 12/09/2011 NEGATIVE  NEGATIVE Final  . Leukocytes, UA 12/09/2011 NEGATIVE  NEGATIVE Final   MICROSCOPIC NOT DONE ON URINES WITH NEGATIVE PROTEIN, BLOOD, LEUKOCYTES, NITRITE, OR GLUCOSE <1000 mg/dL.  [name] Sodium 12/09/2011 139  135 - 145 mEq/L Final  . Potassium 12/09/2011 3.8  3.5 - 5.1 mEq/L Final  . Chloride 12/09/2011 99  96 - 112 mEq/L Final  . CO2 12/09/2011 21  19 - 32 mEq/L Final  . Glucose, Bld 12/09/2011 166* 70 - 99 mg/dL Final  . BUN 12/09/2011 12  6 - 23 mg/dL Final  . Creatinine, Ser 12/09/2011 0.67  0.50 - 1.10 mg/dL Final  . Calcium 12/09/2011 10.3  8.4 - 10.5 mg/dL Final  . Total Protein 12/09/2011 7.5  6.0 - 8.3 g/dL Final  . Albumin 12/09/2011 3.9  3.5 - 5.2 g/dL Final  . AST 12/09/2011 53* 0 - 37 U/L Final  . ALT 12/09/2011 27  0 - 35 U/L Final  . Alkaline Phosphatase 12/09/2011 60  39 - 117 U/L Final  . Total Bilirubin 12/09/2011 0.4  0.3  -  1.2 mg/dL Final  . GFR calc non Af Amer 12/09/2011 >90  >90 mL/min Final  . GFR calc Af Amer 12/09/2011 >90  >90 mL/min Final   Comment:                                 The eGFR has been calculated                          using the CKD EPI equation.                          This calculation has not been                          validated in all clinical                          situations.                          eGFR's persistently                          <90 mL/min signify                          possible Chronic Kidney Disease.  . Sodium 12/09/2011 140  135 - 145 mEq/L Final  . Potassium 12/09/2011 3.8  3.5 - 5.1 mEq/L Final  . Chloride 12/09/2011 105  96 - 112 mEq/L Final  . BUN 12/09/2011 12  6 - 23 mg/dL Final  . Creatinine, Ser 12/09/2011 0.70  0.50 - 1.10 mg/dL Final  . Glucose, Bld 12/09/2011 159* 70 - 99 mg/dL Final  . Calcium, Ion 12/09/2011 1.16  1.13 - 1.30 mmol/L Final  . TCO2 12/09/2011 21  0 - 100 mmol/L Final  . Hemoglobin 12/09/2011 11.6* 12.0 - 15.0 g/dL Final  . HCT 12/09/2011 34.0* 36.0 - 46.0 % Final  . WBC 12/10/2011 6.3  4.0 - 10.5 K/uL Final  . RBC 12/10/2011 3.29* 3.87 - 5.11 MIL/uL Final  . Hemoglobin 12/10/2011 9.0* 12.0 - 15.0 g/dL Final   Comment: DELTA CHECK NOTED                          REPEATED TO VERIFY  . HCT 12/10/2011 28.2* 36.0 - 46.0 % Final  . MCV 12/10/2011 85.7  78.0 - 100.0 fL Final  . MCH 12/10/2011 27.4  26.0 - 34.0 pg Final  . MCHC 12/10/2011 31.9  30.0 - 36.0 g/dL Final  . RDW 12/10/2011 15.2  11.5 - 15.5 % Final  . Platelets 12/10/2011 121* 150 - 400 K/uL Final  . Sodium 12/10/2011 138  135 - 145 mEq/L Final  . Potassium 12/10/2011 4.0  3.5 - 5.1 mEq/L Final  . Chloride 12/10/2011 103  96 - 112 mEq/L Final  . CO2 12/10/2011 24  19 - 32 mEq/L Final  . Glucose, Bld 12/10/2011 135* 70 - 99 mg/dL Final  . BUN 12/10/2011 9  6 - 23 mg/dL Final  . Creatinine, Ser 12/10/2011 0.64  0.50 - 1.10 mg/dL Final  . Calcium 12/10/2011 9.0   8.4 - 10.5 mg/dL Final  . GFR calc  non Af Amer 12/10/2011 >90  >90 mL/min Final  . GFR calc Af Amer 12/10/2011 >90  >90 mL/min Final   Comment:                                 The eGFR has been calculated                          using the CKD EPI equation.                          This calculation has not been                          validated in all clinical                          situations.                          eGFR's persistently                          <90 mL/min signify                          possible Chronic Kidney Disease.  . Glucose-Capillary 12/09/2011 124* 70 - 99 mg/dL Final  . Glucose-Capillary 12/10/2011 142* 70 - 99 mg/dL Final  . Glucose-Capillary 12/10/2011 169* 70 - 99 mg/dL Final  . Comment 1 12/10/2011 Notify RN   Final  . Glucose-Capillary 12/10/2011 157* 70 - 99 mg/dL Final  . Comment 1 12/10/2011 Notify RN   Final    Assessment:  [name] [name] is a 64 y.o. female with chronic mild thrombocytopenia (98,000 to 124,000) since 2013 without trend. Etiology appears secondary to splenomegaly secondary to cirrhosis and portal hypertension.  She denies any new medications or herbal products.  Abdominal and pelvic CT scan on 11/02/2013 revealed mild nodularity of the hepatic contour with mild hypertrophy of the caudate lobe.  Spleen was enlarged (15.5 cm).  Findings were worrisome for hepatic cirrhosis and portal hypertension. Liver duplex on 12/25/2013 revealed patent portal, hepatic, and splenic veins.  PT was 11.9 with an INR of 1.1 (normal) on 02/25/2014.  Hepatitis B and C testing was negative on 11/28/2013.  She has a mild normocytic anemia.  Colonoscopy in 2016 was negative.  She denies any melena or hematochezia.  Diet is good.  Symptomatically, she has lost 40-45 pounds in the past 2 years possibly due to her diabetes medication.  She notes some fevers and sweats.  She has after bath itching.  Exam reveals a palpable spleen tip.    Plan: 1. Discuss chronic  thrombocytopenia and differential.  Suspect etiology likely secondary to cirrhosis and subsequent portal hypertension.  Differential includes a low grade lymphoma.  As patient has had a recent abdominal and pelvic CT scan, discuss chest CT for completeness.  Concerning features include weight loss, fever/sweats, and after bath itching. 2. Labs:  SPEP, immunoglobulins (IgG, IgA, IgM), PTT, TSH, B12, folate, HIV testing, LDH.  3. Chest CT- r/o adenopathy. 4. RTC in 7-10 days for review of work-up and discussion regarding direction of therapy.   Lequita Asal, MD  07/30/2014, 11:19 PM

## 2014-08-01 ENCOUNTER — Inpatient Hospital Stay: Payer: Medicare Other

## 2014-08-01 DIAGNOSIS — D696 Thrombocytopenia, unspecified: Secondary | ICD-10-CM

## 2014-08-01 DIAGNOSIS — R61 Generalized hyperhidrosis: Secondary | ICD-10-CM

## 2014-08-01 DIAGNOSIS — R161 Splenomegaly, not elsewhere classified: Secondary | ICD-10-CM

## 2014-08-01 DIAGNOSIS — R634 Abnormal weight loss: Secondary | ICD-10-CM

## 2014-08-01 LAB — CBC WITH DIFFERENTIAL/PLATELET
Basophils Absolute: 0 10*3/uL (ref 0–0.1)
Basophils Relative: 1 %
Eosinophils Absolute: 0.2 10*3/uL (ref 0–0.7)
Eosinophils Relative: 4 %
HCT: 39.1 % (ref 35.0–47.0)
Hemoglobin: 12.7 g/dL (ref 12.0–16.0)
Lymphocytes Relative: 30 %
Lymphs Abs: 1.4 10*3/uL (ref 1.0–3.6)
MCH: 27.2 pg (ref 26.0–34.0)
MCHC: 32.5 g/dL (ref 32.0–36.0)
MCV: 83.7 fL (ref 80.0–100.0)
Monocytes Absolute: 0.2 10*3/uL (ref 0.2–0.9)
Monocytes Relative: 5 %
Neutro Abs: 2.8 10*3/uL (ref 1.4–6.5)
Neutrophils Relative %: 60 %
Platelets: 108 10*3/uL — ABNORMAL LOW (ref 150–440)
RBC: 4.67 MIL/uL (ref 3.80–5.20)
RDW: 14 % (ref 11.5–14.5)
WBC: 4.7 10*3/uL (ref 3.6–11.0)

## 2014-08-01 LAB — VITAMIN B12: Vitamin B-12: 250 pg/mL (ref 180–914)

## 2014-08-01 LAB — FOLATE: Folate: 12.5 ng/mL (ref >5.9)

## 2014-08-01 LAB — LACTATE DEHYDROGENASE: LDH: 119 U/L (ref 98–192)

## 2014-08-01 LAB — TSH: TSH: 0.067 u[IU]/mL — ABNORMAL LOW (ref 0.350–4.500)

## 2014-08-01 LAB — APTT: aPTT: 31 seconds (ref 24–36)

## 2014-08-02 ENCOUNTER — Inpatient Hospital Stay: Payer: Medicare Other | Attending: Hematology and Oncology

## 2014-08-02 DIAGNOSIS — E1140 Type 2 diabetes mellitus with diabetic neuropathy, unspecified: Secondary | ICD-10-CM | POA: Insufficient documentation

## 2014-08-02 DIAGNOSIS — R161 Splenomegaly, not elsewhere classified: Secondary | ICD-10-CM | POA: Diagnosis not present

## 2014-08-02 DIAGNOSIS — K766 Portal hypertension: Secondary | ICD-10-CM | POA: Diagnosis not present

## 2014-08-02 DIAGNOSIS — G4730 Sleep apnea, unspecified: Secondary | ICD-10-CM | POA: Diagnosis not present

## 2014-08-02 DIAGNOSIS — I10 Essential (primary) hypertension: Secondary | ICD-10-CM | POA: Insufficient documentation

## 2014-08-02 DIAGNOSIS — M1990 Unspecified osteoarthritis, unspecified site: Secondary | ICD-10-CM | POA: Diagnosis not present

## 2014-08-02 DIAGNOSIS — K7460 Unspecified cirrhosis of liver: Secondary | ICD-10-CM | POA: Diagnosis not present

## 2014-08-02 DIAGNOSIS — R7989 Other specified abnormal findings of blood chemistry: Secondary | ICD-10-CM

## 2014-08-02 DIAGNOSIS — D649 Anemia, unspecified: Secondary | ICD-10-CM | POA: Insufficient documentation

## 2014-08-02 DIAGNOSIS — R634 Abnormal weight loss: Secondary | ICD-10-CM | POA: Diagnosis not present

## 2014-08-02 DIAGNOSIS — Z79899 Other long term (current) drug therapy: Secondary | ICD-10-CM | POA: Diagnosis not present

## 2014-08-02 DIAGNOSIS — Z7901 Long term (current) use of anticoagulants: Secondary | ICD-10-CM | POA: Insufficient documentation

## 2014-08-02 DIAGNOSIS — M109 Gout, unspecified: Secondary | ICD-10-CM | POA: Diagnosis not present

## 2014-08-02 DIAGNOSIS — R509 Fever, unspecified: Secondary | ICD-10-CM | POA: Insufficient documentation

## 2014-08-02 DIAGNOSIS — D696 Thrombocytopenia, unspecified: Secondary | ICD-10-CM

## 2014-08-02 DIAGNOSIS — E079 Disorder of thyroid, unspecified: Secondary | ICD-10-CM | POA: Diagnosis not present

## 2014-08-02 LAB — PROTEIN ELECTROPHORESIS, SERUM
A/G Ratio: 1.2 (ref 0.7–1.7)
Albumin ELP: 3.7 g/dL (ref 2.9–4.4)
Alpha-1-Globulin: 0.3 g/dL (ref 0.0–0.4)
Alpha-2-Globulin: 0.8 g/dL (ref 0.4–1.0)
Beta Globulin: 1.1 g/dL (ref 0.7–1.3)
Gamma Globulin: 0.9 g/dL (ref 0.4–1.8)
Globulin, Total: 3.1 g/dL (ref 2.2–3.9)
Total Protein ELP: 6.8 g/dL (ref 6.0–8.5)

## 2014-08-02 LAB — IGG, IGA, IGM
IgA: 176 mg/dL (ref 87–352)
IgG (Immunoglobin G), Serum: 904 mg/dL (ref 700–1600)
IgM, Serum: 83 mg/dL (ref 26–217)

## 2014-08-02 LAB — HIV ANTIBODY (ROUTINE TESTING W REFLEX): HIV Screen 4th Generation wRfx: NONREACTIVE

## 2014-08-02 LAB — T4, FREE: Free T4: 1.46 ng/dL — ABNORMAL HIGH (ref 0.61–1.12)

## 2014-08-06 ENCOUNTER — Telehealth: Payer: Self-pay

## 2014-08-06 ENCOUNTER — Other Ambulatory Visit: Payer: Self-pay

## 2014-08-06 DIAGNOSIS — D696 Thrombocytopenia, unspecified: Secondary | ICD-10-CM

## 2014-08-06 NOTE — Telephone Encounter (Signed)
Pt notified of lab results by Vickki Muff, RN; faxed lab results to Dr. Antony Blackbird at [facility] at [phone] as requested by Dr. Mike Gip

## 2014-08-07 LAB — METHYLMALONIC ACID, SERUM: Methylmalonic Acid, Quantitative: 246 nmol/L (ref 0–378)

## 2014-08-08 ENCOUNTER — Inpatient Hospital Stay: Payer: Medicare Other

## 2014-08-08 ENCOUNTER — Inpatient Hospital Stay (HOSPITAL_BASED_OUTPATIENT_CLINIC_OR_DEPARTMENT_OTHER): Payer: Medicare Other | Admitting: Hematology and Oncology

## 2014-08-08 VITALS — BP 134/67 | HR 135 | Temp 95.9°F | Ht 62.0 in | Wt 239.2 lb

## 2014-08-08 DIAGNOSIS — D696 Thrombocytopenia, unspecified: Secondary | ICD-10-CM | POA: Diagnosis not present

## 2014-08-08 DIAGNOSIS — K766 Portal hypertension: Secondary | ICD-10-CM

## 2014-08-08 DIAGNOSIS — D649 Anemia, unspecified: Secondary | ICD-10-CM

## 2014-08-08 DIAGNOSIS — R161 Splenomegaly, not elsewhere classified: Secondary | ICD-10-CM

## 2014-08-08 DIAGNOSIS — R509 Fever, unspecified: Secondary | ICD-10-CM | POA: Diagnosis not present

## 2014-08-08 DIAGNOSIS — R634 Abnormal weight loss: Secondary | ICD-10-CM

## 2014-08-08 DIAGNOSIS — K7460 Unspecified cirrhosis of liver: Secondary | ICD-10-CM | POA: Diagnosis not present

## 2014-08-08 DIAGNOSIS — E079 Disorder of thyroid, unspecified: Secondary | ICD-10-CM

## 2014-08-08 DIAGNOSIS — M1990 Unspecified osteoarthritis, unspecified site: Secondary | ICD-10-CM

## 2014-08-08 DIAGNOSIS — Z7901 Long term (current) use of anticoagulants: Secondary | ICD-10-CM

## 2014-08-08 DIAGNOSIS — E1140 Type 2 diabetes mellitus with diabetic neuropathy, unspecified: Secondary | ICD-10-CM

## 2014-08-08 DIAGNOSIS — Z79899 Other long term (current) drug therapy: Secondary | ICD-10-CM

## 2014-08-08 DIAGNOSIS — R7989 Other specified abnormal findings of blood chemistry: Secondary | ICD-10-CM

## 2014-08-08 DIAGNOSIS — I10 Essential (primary) hypertension: Secondary | ICD-10-CM

## 2014-08-08 NOTE — Progress Notes (Signed)
Pt here today for follow up regarding thrombocytopenia; offers no complaints today

## 2014-08-08 NOTE — Progress Notes (Signed)
[facility] day:  08/08/2014  Chief Complaint: [name] [name] is a 64 y.o. female with thrombocytopenia who is seen for review of work-up and discussion regarding direction of therapy.  HPI: The patient was last seen in the medical oncology clinic on 07/30/2014.  At that time, she was seen in consultation for thrombocytopenia.  She had mild thrombocytopenia (98,000 to 124,000) since 2013 without trend. Etiology appears secondary to splenomegaly secondary to cirrhosis and portal hypertension.  She denied any new medications or herbal products.  Abdominal and pelvic CT scan on 11/02/2013 revealed mild nodularity of the hepatic contour with mild hypertrophy of the caudate lobe.  Spleen was enlarged (15.5 cm).  Findings were worrisome for hepatic cirrhosis and portal hypertension. Liver duplex on 12/25/2013 revealed patent portal, hepatic, and splenic veins.  PT was 11.9 with an INR of 1.1 (normal) on 02/25/2014.  Hepatitis B and C testing was negative on 11/28/2013.  She underwent a work-up.  Labs from 08/01/2014 revealed a hematocrit 39.1, hemoglobin 12.7, MCV 83.7, platelets 108,000, white count 4700 with an ANC of 2800. Differential was unremarkable.  LDH was 119. SPEP revealed no monoclonal protein. Immunoglobulin levels were normal.  B12 was 215 (low end of normal).  Folate was 12.5.   TSH was 0.067 (0.35-4.5).  HIV testing was negative.  Symptomatically, she voices no new concerns.  Past Medical History  Diagnosis Date  . Diabetes mellitus without complication ([place])   . Thyroid disease   . Gout   . Neuropathy ([place])   . Hypertension   . Sleep apnea   . Cough   . Migraine   . Anxiety   . 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 Arthritis   . Diabetic neuropathy ([place])   . Gout   . Blood dyscrasia     low platelet count- sees [name] , [name] ( Note in EPIC from 07/2014) at [facility]    Past Surgical History   Procedure Laterality Date  . Cholecystectomy    . Tubal ligation    . Joint replacement      left knee  . Total knee arthroplasty Right 02/28/2015    Procedure: RIGHT TOTAL KNEE ARTHROPLASTY;  Surgeon: [name], MD;  Location: [facility];  Service: Orthopedics;  Laterality: Right;    Family History  Problem Relation Age of Onset  . Bone cancer Paternal Grandmother   . Heart failure Paternal Grandmother   . Cancer Maternal Grandmother   . Heart failure Maternal Grandfather   . Cancer Sister     Social History:  reports that she has never smoked. She has never used smokeless tobacco. She reports that she does not drink alcohol or use illicit drugs.  The patient is accompanied by her husband.  Allergies:  Allergies  Allergen Reactions  . Influenza A (H1n1) Monoval Pf Anaphylaxis  . Codeine Swelling    Current Medications: Current Outpatient Prescriptions  Medication Sig Dispense Refill  . citalopram (CELEXA) 20 MG tablet Take 20 mg by mouth daily.    . furosemide (LASIX) 20 MG tablet Take 20 mg by mouth daily as needed. For fluid/swelling    . Liraglutide (VICTOZA) 18 MG/3ML SOPN Inject 1.8 mg into the skin daily.    [name] allopurinol (ZYLOPRIM) 100 MG tablet Take 100 mg by mouth daily.    [name] gabapentin (NEURONTIN) 600 MG tablet Take 600-1,200 mg by mouth 3 (three) times daily. Take 1 in the morning, 1 in  the afternoon and 2 at bedtime.    [name] levothyroxine (SYNTHROID, LEVOTHROID) 175 MCG tablet Take 1 tablet by mouth daily.  0  . loratadine (CLARITIN) 10 MG tablet Take 10 mg by mouth daily.    [name] losartan (COZAAR) 50 MG tablet Take 1 tablet by mouth every morning.     . methocarbamol (ROBAXIN) 500 MG tablet Take 1 tablet (500 mg total) by mouth every 6 (six) hours as needed for muscle spasms. 40 tablet 0  . oxyCODONE-acetaminophen (PERCOCET) 10-325 MG tablet Take 1 tablet by mouth every 4 (four) hours as needed for pain. 50 tablet 0  . rivaroxaban (XARELTO) 10 MG TABS tablet  Take 1 tablet (10 mg total) by mouth daily with breakfast. 30 tablet 0   No current facility-administered medications for this visit.    Review of Systems:  GENERAL:  Feels "ok".  Some fever and sweats.  Weight loss of 40-45# in the past 2 years.  Weight up since last visit. PERFORMANCE STATUS (ECOG):  1 HEENT:  No visual changes, runny nose, sore throat, mouth sores or tenderness. Lungs: No shortness of breath or cough.  No hemoptysis. Cardiac:  No chest pain, palpitations, orthopnea, or PND. GI:  Abdominal symptoms for "awhile".  Gassy.  No nausea, vomiting, diarrhea, constipation, melena or hematochezia. GU:  No urgency, frequency, dysuria, or hematuria. Musculoskeletal:  Arthritis in knees, ankles, feet, and hands.  No back pain.  No muscle tenderness. Extremities:  No pain or swelling. Skin:  After bath itching "for awhile".  No rashes or skin changes. Neuro:  Head ache with tachycardia.  No numbness or weakness, balance or coordination issues. Endocrine:  Diabetes.  Thyroid disease.  No hot flashes or night sweats. Psych:  No mood changes, depression or anxiety. Pain:  No focal pain. Review of systems:  All other systems reviewed and found to be negative.   Physical Exam: Blood pressure 134/67, pulse 135, temperature 95.9 F (35.5 C), temperature source Tympanic, height 5\' 2"  (1.575 m), weight 239 lb 3.2 oz (108.5 kg).  GENERAL:  Well developed, well nourished, sitting comfortably in the exam room in no acute distress. MENTAL STATUS:  Alert and oriented to person, place and time. HEAD:  Long graying hair.  Normocephalic, atraumatic, face symmetric, no Cushingoid features. EYES:  Glasses.  Brown eyes.  No conjunctivitis or scleral icterus. NEUROLOGICAL: Unremarkable. PSYCH:  Appropriate.   Appointment on 08/08/2014  Component Date Value Ref Range Status  . T4, Total 08/08/2014 11.5  4.5 - 12.0 ug/dL Final   Comment: (NOTE) Performed At: [facility], [place] [id number] Lindon Romp MD DG:[id number]     Assessment:  [name] [name] is a 64 y.o. female with chronic mild thrombocytopenia (98,000 to 124,000) since 2013 without trend. Etiology appears secondary to splenomegaly secondary to cirrhosis and portal hypertension.  She denies any new medications or herbal products.  Abdominal and pelvic CT scan on 11/02/2013 revealed mild nodularity of the hepatic contour with mild hypertrophy of the caudate lobe.  Spleen was enlarged (15.5 cm).  Findings were worrisome for hepatic cirrhosis and portal hypertension. Liver duplex on 12/25/2013 revealed patent portal, hepatic, and splenic veins.  PT was 11.9 with an INR of 1.1 (normal) on 02/25/2014.  Hepatitis B and C testing was negative on 11/28/2013.  She has a history of a mild normocytic anemia.  Colonoscopy in 2016 was negative.  She denies any melena or hematochezia.  Diet is good.  Work-up on  08/01/2014 revealed hematocrit 39.1, hemoglobin 12.7, MCV 83.7, platelets 108,000, white count 4700 with an ANC of 2800. Differential was unremarkable.  Normal studies included:  LDH, SPEP, immunoglobulin levels, folate, and HIV testing.  B12 was 215 (low end of normal).  TSH was 0.067 (0.35-4.5).   Symptomatically, she has lost 40-45 pounds in the past 2 years possibly due to her diabetes medication.  She notes some fevers and sweats.  She has after bath itching.  Exam reveals a palpable spleen tip.    Plan: 1. Review work-up.  Discuss normal CBC.  Discuss low TSH and low normal B12.  Discuss additional labs.  Discuss concern for weight loss. Discuss chest CT for completeness.  Suspect etiology of thrombocytopenia due to splenic sequestration. 2. Labs:  T4, free T4, MMA.  3. Await approval of chest CT. 4. RTC in 3-4 months for MD assessment and labs.   Lequita Asal, MD  08/08/2014, 8:40 AM

## 2014-08-09 LAB — T4: T4, Total: 11.5 ug/dL (ref 4.5–12.0)

## 2014-08-15 DIAGNOSIS — H9121 Sudden idiopathic hearing loss, right ear: Secondary | ICD-10-CM | POA: Diagnosis not present

## 2014-08-29 DIAGNOSIS — H9311 Tinnitus, right ear: Secondary | ICD-10-CM | POA: Diagnosis not present

## 2014-08-29 DIAGNOSIS — H9041 Sensorineural hearing loss, unilateral, right ear, with unrestricted hearing on the contralateral side: Secondary | ICD-10-CM | POA: Diagnosis not present

## 2014-08-29 DIAGNOSIS — H9191 Unspecified hearing loss, right ear: Secondary | ICD-10-CM | POA: Diagnosis not present

## 2014-08-30 DIAGNOSIS — E1143 Type 2 diabetes mellitus with diabetic autonomic (poly)neuropathy: Secondary | ICD-10-CM | POA: Diagnosis not present

## 2014-08-30 DIAGNOSIS — Z23 Encounter for immunization: Secondary | ICD-10-CM | POA: Diagnosis not present

## 2014-10-02 DIAGNOSIS — H9121 Sudden idiopathic hearing loss, right ear: Secondary | ICD-10-CM | POA: Diagnosis not present

## 2014-10-28 ENCOUNTER — Other Ambulatory Visit: Payer: Self-pay | Admitting: Gastroenterology

## 2014-10-28 DIAGNOSIS — K7469 Other cirrhosis of liver: Secondary | ICD-10-CM

## 2014-10-28 DIAGNOSIS — K521 Toxic gastroenteritis and colitis: Secondary | ICD-10-CM | POA: Diagnosis not present

## 2014-10-28 DIAGNOSIS — K7460 Unspecified cirrhosis of liver: Secondary | ICD-10-CM | POA: Diagnosis not present

## 2014-10-31 DIAGNOSIS — H43821 Vitreomacular adhesion, right eye: Secondary | ICD-10-CM | POA: Diagnosis not present

## 2014-10-31 DIAGNOSIS — H4312 Vitreous hemorrhage, left eye: Secondary | ICD-10-CM | POA: Diagnosis not present

## 2014-10-31 DIAGNOSIS — H43812 Vitreous degeneration, left eye: Secondary | ICD-10-CM | POA: Diagnosis not present

## 2014-10-31 DIAGNOSIS — E119 Type 2 diabetes mellitus without complications: Secondary | ICD-10-CM | POA: Diagnosis not present

## 2014-11-05 ENCOUNTER — Ambulatory Visit
Admission: RE | Admit: 2014-11-05 | Discharge: 2014-11-05 | Disposition: A | Discharge status: Home / Self Care | Payer: Medicare Other | Source: Ambulatory Visit | Attending: Gastroenterology | Admitting: Gastroenterology

## 2014-11-05 DIAGNOSIS — K7469 Other cirrhosis of liver: Secondary | ICD-10-CM

## 2014-11-05 DIAGNOSIS — K7460 Unspecified cirrhosis of liver: Secondary | ICD-10-CM | POA: Diagnosis not present

## 2014-11-15 ENCOUNTER — Inpatient Hospital Stay (HOSPITAL_BASED_OUTPATIENT_CLINIC_OR_DEPARTMENT_OTHER): Payer: Medicare Other | Admitting: Hematology and Oncology

## 2014-11-15 ENCOUNTER — Inpatient Hospital Stay: Payer: Medicare Other | Attending: Hematology and Oncology

## 2014-11-15 ENCOUNTER — Ambulatory Visit
Admission: RE | Admit: 2014-11-15 | Discharge: 2014-11-15 | Disposition: A | Discharge status: Home / Self Care | Payer: Medicare Other | Source: Ambulatory Visit | Attending: Hematology and Oncology | Admitting: Hematology and Oncology

## 2014-11-15 VITALS — BP 105/69 | HR 90 | Temp 96.6°F | Resp 18 | Wt 250.7 lb

## 2014-11-15 DIAGNOSIS — R509 Fever, unspecified: Secondary | ICD-10-CM

## 2014-11-15 DIAGNOSIS — I10 Essential (primary) hypertension: Secondary | ICD-10-CM | POA: Insufficient documentation

## 2014-11-15 DIAGNOSIS — R0600 Dyspnea, unspecified: Secondary | ICD-10-CM | POA: Diagnosis not present

## 2014-11-15 DIAGNOSIS — K766 Portal hypertension: Secondary | ICD-10-CM | POA: Insufficient documentation

## 2014-11-15 DIAGNOSIS — D696 Thrombocytopenia, unspecified: Secondary | ICD-10-CM

## 2014-11-15 DIAGNOSIS — G4730 Sleep apnea, unspecified: Secondary | ICD-10-CM | POA: Diagnosis not present

## 2014-11-15 DIAGNOSIS — E079 Disorder of thyroid, unspecified: Secondary | ICD-10-CM | POA: Insufficient documentation

## 2014-11-15 DIAGNOSIS — R0602 Shortness of breath: Secondary | ICD-10-CM | POA: Diagnosis not present

## 2014-11-15 DIAGNOSIS — R161 Splenomegaly, not elsewhere classified: Secondary | ICD-10-CM | POA: Diagnosis not present

## 2014-11-15 DIAGNOSIS — D649 Anemia, unspecified: Secondary | ICD-10-CM | POA: Insufficient documentation

## 2014-11-15 DIAGNOSIS — M109 Gout, unspecified: Secondary | ICD-10-CM | POA: Diagnosis not present

## 2014-11-15 DIAGNOSIS — Z79899 Other long term (current) drug therapy: Secondary | ICD-10-CM | POA: Insufficient documentation

## 2014-11-15 DIAGNOSIS — M1990 Unspecified osteoarthritis, unspecified site: Secondary | ICD-10-CM

## 2014-11-15 DIAGNOSIS — K7460 Unspecified cirrhosis of liver: Secondary | ICD-10-CM | POA: Insufficient documentation

## 2014-11-15 DIAGNOSIS — E119 Type 2 diabetes mellitus without complications: Secondary | ICD-10-CM

## 2014-11-15 LAB — CBC WITH DIFFERENTIAL/PLATELET
Basophils Absolute: 0 10*3/uL (ref 0–0.1)
Basophils Relative: 1 %
Eosinophils Absolute: 0.5 10*3/uL (ref 0–0.7)
Eosinophils Relative: 8 %
HCT: 37.7 % (ref 35.0–47.0)
Hemoglobin: 12.4 g/dL (ref 12.0–16.0)
Lymphocytes Relative: 26 %
Lymphs Abs: 1.6 10*3/uL (ref 1.0–3.6)
MCH: 27.4 pg (ref 26.0–34.0)
MCHC: 33 g/dL (ref 32.0–36.0)
MCV: 83.2 fL (ref 80.0–100.0)
Monocytes Absolute: 0.4 10*3/uL (ref 0.2–0.9)
Monocytes Relative: 6 %
Neutro Abs: 3.8 10*3/uL (ref 1.4–6.5)
Neutrophils Relative %: 59 %
Platelets: 116 10*3/uL — ABNORMAL LOW (ref 150–440)
RBC: 4.54 MIL/uL (ref 3.80–5.20)
RDW: 15 % — ABNORMAL HIGH (ref 11.5–14.5)
WBC: 6.4 10*3/uL (ref 3.6–11.0)

## 2014-11-15 NOTE — Progress Notes (Signed)
[facility] day:  11/15/2014  Chief Complaint: Zadaya Cuadra Cott is a 64 y.o. female with thrombocytopenia who is seen for 3 month assessment.  HPI: The patient was last seen in the medical oncology clinic on 08/08/2014.  At that time, work-up was reviewed.  CBC from 08/01/2014 revealed a platelet count of 108,000 felt likely due to splenic sequestration.  She had a low TSH and low normal B12.  MMA was normal ruling out B12 deficiency.  Free T4 was elevated with a normal T4.  Labs were forwarded to Dr. Antony Blackbird at [facility].  She did not get approved for a chest CT.  Symptomatically, she feels pretty good.  She still gets tired easily. She is scheduled for an ultrasound of her liver and spleen every 6 months by Dr. Wallis Mart (gastroenterologist).  Her weight has been stable. She denies any fevers, sweats or after bath itching.  Past Medical History  Diagnosis Date  . Diabetes mellitus without complication   . Thyroid disease   . Gout   . Neuropathy   . Hypertension   . Sleep apnea   . Cough   . Migraine   . Anxiety   . 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 Cholecystectomy    . Joint replacement    . Tubal ligation      Family History  Problem Relation Age of Onset  . Bone cancer Paternal Grandmother   . Heart failure Paternal Grandmother   . Cancer Maternal Grandmother   . Heart failure Maternal Grandfather   . Cancer Sister     Social History:  reports that she has never smoked. She has never used smokeless tobacco. She reports that she does not drink alcohol or use illicit drugs.  The patient is accompanied by her husband.  Allergies:  Allergies  Allergen Reactions  . Influenza A (H1n1) Monoval Pf Anaphylaxis  . Codeine Swelling    Current Medications: Current Outpatient Prescriptions  Medication Sig Dispense Refill  . citalopram (CELEXA) 20 MG tablet  Take 20 mg by mouth daily.    . furosemide (LASIX) 20 MG tablet Take 20 mg by mouth daily as needed. For fluid/swelling    . gabapentin (NEURONTIN) 600 MG tablet Take 600 mg by mouth 4 (four) times daily.    [name] ibuprofen (ADVIL,MOTRIN) 200 MG tablet Take 200 mg by mouth 2 (two) times daily.    [name] levothyroxine (SYNTHROID, LEVOTHROID) 175 MCG tablet Take 1 tablet by mouth daily.  0  . Liraglutide (VICTOZA) 18 MG/3ML SOPN Inject 1.8 mg into the skin daily.    [name] loratadine (CLARITIN) 10 MG tablet Take 10 mg by mouth daily.    [name] losartan (COZAAR) 50 MG tablet Take 1 tablet by mouth daily.    . naproxen (NAPROSYN) 500 MG tablet Take 500 mg by mouth 2 (two) times daily with a meal.      No current facility-administered medications for this visit.    Review of Systems:  GENERAL:  Feels "ok".  Some fever and sweats.  Weight stable. PERFORMANCE STATUS (ECOG):  1 HEENT:  No visual changes, runny nose, sore throat, mouth sores or tenderness. Lungs: Shortness of breath with exertion.  No cough.  No hemoptysis. Cardiac:  No chest pain, palpitations, orthopnea, or PND. GI:  No nausea, vomiting, diarrhea, constipation, melena or hematochezia.  Abdominal ultrasound every 6 months. GU:  No urgency, frequency,  dysuria, or hematuria. Musculoskeletal:  Arthritis in knees, ankles, feet, and hands.  No back pain.  No muscle tenderness. Extremities:  No pain or swelling. Skin:  No after bath itching.  No rashes or skin changes. Neuro:  No headache, numbness or weakness, balance or coordination issues. Endocrine:  Diabetes.  Thyroid disease.  No hot flashes or night sweats. Psych:  No mood changes, depression or anxiety. Pain:  No focal pain. Review of systems:  All other systems reviewed and found to be negative.   Physical Exam: Blood pressure 105/69, pulse 90, temperature 96.6 F (35.9 C), temperature source Tympanic, resp. rate 18, weight 250 lb 10.6 oz (113.7 kg).  GENERAL:  Well developed, well  nourished, sitting comfortably in the exam room in no acute distress.  She has a cane at her side. MENTAL STATUS:  Alert and oriented to person, place and time. HEAD:  Long brown hair.  Normocephalic, atraumatic, face symmetric, no Cushingoid features. EYES:  Sunglasses.  Brown eyes.  Pupils equal round and reactive to light and accomodation.  No conjunctivitis or scleral icterus. ENT:  Oropharynx clear without lesion.  Dentures.  Tongue normal. Mucous membranes moist.  RESPIRATORY:  Clear to auscultation without rales, wheezes or rhonchi. CARDIOVASCULAR:  Regular rate and rhythm without murmur, rub or gallop. ABDOMEN:  Fully round.  Pannus.  Spleen tip palpable.  Slightly tender LUQ without guarding or rebound tenderness.  Soft with active bowel sounds, and no appreciable hepatomegaly.  No masses. SKIN:  Abdominal ecchymosis secondary to Victoza injections.  No rashes, ulcers or lesions. EXTREMITIES: Brawny lower extremities.  No palpable cords. LYMPH NODES: No palpable cervical, supraclavicular, axillary or inguinal adenopathy  NEUROLOGICAL: Unremarkable. PSYCH:  Appropriate.   Appointment on 11/15/2014  Component Date Value Ref Range Status  . WBC 11/15/2014 6.4  3.6 - 11.0 K/uL Final  . RBC 11/15/2014 4.54  3.80 - 5.20 MIL/uL Final  . Hemoglobin 11/15/2014 12.4  12.0 - 16.0 g/dL Final  . HCT 11/15/2014 37.7  35.0 - 47.0 % Final  . MCV 11/15/2014 83.2  80.0 - 100.0 fL Final  . MCH 11/15/2014 27.4  26.0 - 34.0 pg Final  . MCHC 11/15/2014 33.0  32.0 - 36.0 g/dL Final  . RDW 11/15/2014 15.0* 11.5 - 14.5 % Final  . Platelets 11/15/2014 116* 150 - 440 K/uL Final  . Neutrophils Relative % 11/15/2014 59   Final  . Neutro Abs 11/15/2014 3.8  1.4 - 6.5 K/uL Final  . Lymphocytes Relative 11/15/2014 26   Final  . Lymphs Abs 11/15/2014 1.6  1.0 - 3.6 K/uL Final  . Monocytes Relative 11/15/2014 6   Final  . Monocytes Absolute 11/15/2014 0.4  0.2 - 0.9 K/uL Final  . Eosinophils Relative  11/15/2014 8   Final  . Eosinophils Absolute 11/15/2014 0.5  0 - 0.7 K/uL Final  . Basophils Relative 11/15/2014 1   Final  . Basophils Absolute 11/15/2014 0.0  0 - 0.1 K/uL Final    Assessment:  Ezrah A Biven is a 64 y.o. female with chronic mild thrombocytopenia (98,000 to 124,000) since 2013 without trend. Etiology appears secondary to splenomegaly secondary to cirrhosis and portal hypertension.  She denies any new medications or herbal products.  Abdominal and pelvic CT scan on 11/02/2013 revealed mild nodularity of the hepatic contour with mild hypertrophy of the caudate lobe.  Spleen was enlarged (15.5 cm).  Findings were worrisome for hepatic cirrhosis and portal hypertension. Liver duplex on 12/25/2013 revealed patent portal, hepatic, and  splenic veins.  PT was 11.9 with an INR of 1.1 (normal) on 02/25/2014.  Hepatitis B and C testing was negative on 11/28/2013.  She has a history of a mild normocytic anemia. Colonoscopy in 2016 was negative. She denies any melena or hematochezia. Diet is good.  Work-up on 08/01/2014 revealed hematocrit 39.1, hemoglobin 12.7, MCV 83.7, platelets 108,000, white count 4700 with an ANC of 2800. Differential was unremarkable. Normal studies included: LDH, SPEP, immunoglobulin levels, folate, and HIV testing. B12 was 215 (low end of normal). MMA was normal.  TSH was 0.067 (0.35-4.5) with a normal T4 and an elevated freeT4.   Symptomatically, her weight is stable.  She denies any B symptoms.  She denies any bruising or bleeding.  Exam is stable.  Plan: 1. Labs today:  CBC with diff. 2. CXR (PA and lateral) 3. RTC in 6 months for MD assess and labs (CBC with diff +/- others).  Addendum: CXR today reveals no acute findings.   Lequita Asal, MD  11/15/2014, 11:39 AM

## 2014-11-22 DIAGNOSIS — H43812 Vitreous degeneration, left eye: Secondary | ICD-10-CM | POA: Diagnosis not present

## 2014-11-22 DIAGNOSIS — H43821 Vitreomacular adhesion, right eye: Secondary | ICD-10-CM | POA: Diagnosis not present

## 2014-11-22 DIAGNOSIS — H4312 Vitreous hemorrhage, left eye: Secondary | ICD-10-CM | POA: Diagnosis not present

## 2014-11-22 DIAGNOSIS — E119 Type 2 diabetes mellitus without complications: Secondary | ICD-10-CM | POA: Diagnosis not present

## 2014-12-19 DIAGNOSIS — M109 Gout, unspecified: Secondary | ICD-10-CM | POA: Diagnosis not present

## 2014-12-19 DIAGNOSIS — M170 Bilateral primary osteoarthritis of knee: Secondary | ICD-10-CM | POA: Diagnosis not present

## 2014-12-19 DIAGNOSIS — G8929 Other chronic pain: Secondary | ICD-10-CM | POA: Diagnosis not present

## 2014-12-19 DIAGNOSIS — E1140 Type 2 diabetes mellitus with diabetic neuropathy, unspecified: Secondary | ICD-10-CM | POA: Diagnosis not present

## 2015-02-02 DIAGNOSIS — Z923 Personal history of irradiation: Secondary | ICD-10-CM

## 2015-02-02 DIAGNOSIS — C50919 Malignant neoplasm of unspecified site of unspecified female breast: Secondary | ICD-10-CM

## 2015-02-02 HISTORY — DX: Personal history of irradiation: Z92.3

## 2015-02-02 HISTORY — PX: JOINT REPLACEMENT: SHX530

## 2015-02-02 HISTORY — DX: Malignant neoplasm of unspecified site of unspecified female breast: C50.919

## 2015-02-06 DIAGNOSIS — M25562 Pain in left knee: Secondary | ICD-10-CM | POA: Diagnosis not present

## 2015-02-17 DIAGNOSIS — H43821 Vitreomacular adhesion, right eye: Secondary | ICD-10-CM | POA: Diagnosis not present

## 2015-02-17 DIAGNOSIS — E119 Type 2 diabetes mellitus without complications: Secondary | ICD-10-CM | POA: Diagnosis not present

## 2015-02-17 DIAGNOSIS — H43813 Vitreous degeneration, bilateral: Secondary | ICD-10-CM | POA: Diagnosis not present

## 2015-02-19 ENCOUNTER — Other Ambulatory Visit (HOSPITAL_COMMUNITY): Payer: Self-pay | Admitting: Orthopaedic Surgery

## 2015-02-19 NOTE — Patient Instructions (Addendum)
[name] [name]  02/19/2015   Your procedure is scheduled on: Friday 02/28/2015  Report to [facility] Main  Entrance take [place]  elevators to 3rd floor to  [place] at  [place]  AM.  Call this number if you have problems the morning of surgery [phone]   Remember: ONLY 1 PERSON [name] [name] GO WITH YOU TO SHORT STAY TO GET  READY MORNING OF [name].   Do not eat food or drink liquids :After Midnight.   BRING CPAP MASK AND TUBING TO HOSPITAL MORNING OF SURGERY!   Take these medicines the morning of surgery with A SIP OF WATER: Citalopram, Levothyroxine,Gabapentin              DO NOT TAKE ANY DIABETIC MEDICATIONS DAY OF YOUR SURGERY                               You [name] [name] not have any metal on your body including hair pins and              piercings  Do not wear jewelry, make-up, lotions, powders or perfumes, deodorant             Do not wear nail polish.  Do not shave  48 hours prior to surgery.              Men [name] [name] shave face and neck.   Do not bring valuables to the hospital. [place].  Contacts, dentures or bridgework [name] [name] not be worn into surgery.  Leave suitcase in the car. After surgery it [name] [name] be brought to your room.     Patients discharged the day of surgery will not be allowed to drive home.  Name and phone number of your driver:  Special Instructions: N/A              Please read over the following fact sheets you were given: _____________________________________________________________________             [name] - Preparing for Surgery Before surgery, you can play an important role.  Because skin is not sterile, your skin needs to be as free of germs as possible.  You can reduce the number of germs on your skin by washing with CHG (chlorahexidine gluconate) soap before surgery.  CHG is an antiseptic cleaner which kills germs and bonds with the skin to continue killing germs even after  washing. Please DO NOT use if you have an allergy to CHG or antibacterial soaps.  If your skin becomes reddened/irritated stop using the CHG and inform your nurse when you arrive at Short Stay. Do not shave (including legs and underarms) for at least 48 hours prior to the first CHG shower.  You [name] [name] shave your face/neck. Please follow these instructions carefully:  1.  Shower with CHG Soap the night before surgery and the  morning of Surgery.  2.  If you choose to wash your hair, wash your hair first as usual with your  normal  shampoo.  3.  After you shampoo, rinse your hair and body thoroughly to remove the  shampoo.  4.  Use CHG as you would any other liquid soap.  You can apply chg directly  to the skin and wash                       Gently with a scrungie or clean washcloth.  5.  Apply the CHG Soap to your body ONLY FROM THE NECK DOWN.   Do not use on face/ open                           Wound or open sores. Avoid contact with eyes, ears mouth and genitals (private parts).                       Wash face,  Genitals (private parts) with your normal soap.             6.  Wash thoroughly, paying special attention to the area where your surgery  will be performed.  7.  Thoroughly rinse your body with warm water from the neck down.  8.  DO NOT shower/wash with your normal soap after using and rinsing off  the CHG Soap.                9.  Pat yourself dry with a clean towel.            10.  Wear clean pajamas.            11.  Place clean sheets on your bed the night of your first shower and do not  sleep with pets. Day of Surgery : Do not apply any lotions/deodorants the morning of surgery.  Please wear clean clothes to the hospital/surgery center.  FAILURE TO FOLLOW THESE INSTRUCTIONS [name] [name] RESULT IN THE CANCELLATION OF YOUR SURGERY PATIENT SIGNATURE_________________________________  NURSE  SIGNATURE__________________________________  ________________________________________________________________________   Adam [name] [name]  An incentive spirometer is a tool that can help keep your lungs clear and active. This tool measures how well you are filling your lungs with each breath. Taking long deep breaths [name] [name] help reverse or decrease the chance of developing breathing (pulmonary) problems (especially infection) following:  A long period of time when you are unable to move or be active. BEFORE THE PROCEDURE   If the spirometer includes an indicator to show your best effort, your nurse or respiratory therapist will set it to a desired goal.  If possible, sit up straight or lean slightly forward. Try not to slouch.  Hold the incentive spirometer in an upright position. INSTRUCTIONS FOR USE   Sit on the edge of your bed if possible, or sit up as far as you can in bed or on a chair.  Hold the incentive spirometer in an upright position.  Breathe out normally.  Place the mouthpiece in your mouth and seal your lips tightly around it.  Breathe in slowly and as deeply as possible, raising the piston or the ball toward the top of the column.  Hold your breath for 3-5 seconds or for as long as possible. Allow the piston or ball to fall to the bottom of the column.  Remove the mouthpiece from your mouth and breathe out normally.  Rest for a few seconds and repeat Steps 1 through 7 at least 10 times every 1-2 hours when you are awake. Take your time and take a few normal breaths between deep breaths.  The spirometer [name] [name] include an indicator to show  your best effort. Use the indicator as a goal to work toward during each repetition.  After each set of 10 deep breaths, practice coughing to be sure your lungs are clear. If you have an incision (the cut made at the time of surgery), support your incision when coughing by placing a pillow or rolled up towels firmly against it. Once  you are able to get out of bed, walk around indoors and cough well. You [name] [name] stop using the incentive spirometer when instructed by your caregiver.  RISKS AND COMPLICATIONS  Take your time so you do not get dizzy or light-headed.  If you are in pain, you [name] [name] need to take or ask for pain medication before doing incentive spirometry. It is harder to take a deep breath if you are having pain. AFTER USE  Rest and breathe slowly and easily.  It can be helpful to keep track of a log of your progress. Your caregiver can provide you with a simple table to help with this. If you are using the spirometer at home, follow these instructions: [place] IF:   You are having difficultly using the spirometer.  You have trouble using the spirometer as often as instructed.  Your pain medication is not giving enough relief while using the spirometer.  You develop fever of 100.5 F (38.1 C) or higher. SEEK IMMEDIATE MEDICAL CARE IF:   You cough up bloody sputum that had not been present before.  You develop fever of 102 F (38.9 C) or greater.  You develop worsening pain at or near the incision site. MAKE SURE YOU:   Understand these instructions.  Will watch your condition.  Will get help right away if you are not doing well or get worse. Document Released: 05/31/2006 Document Revised: 04/12/2011 Document Reviewed: 08/01/2006 ExitCare Patient Information 2014 ExitCare, [place].   ________________________________________________________________________  WHAT IS A BLOOD TRANSFUSION? Blood Transfusion Information  A transfusion is the replacement of blood or some of its parts. Blood is made up of multiple cells which provide different functions.  Red blood cells carry oxygen and are used for blood loss replacement.  White blood cells fight against infection.  Platelets control bleeding.  Plasma helps clot blood.  Other blood products are available for specialized needs, such as  hemophilia or other clotting disorders. BEFORE THE TRANSFUSION  Who gives blood for transfusions?   Healthy volunteers who are fully evaluated to make sure their blood is safe. This is blood bank blood. Transfusion therapy is the safest it has ever been in the practice of medicine. Before blood is taken from a donor, a complete history is taken to make sure that person has no history of diseases nor engages in risky social behavior (examples are intravenous drug use or sexual activity with multiple partners). The donor's travel history is screened to minimize risk of transmitting infections, such as malaria. The donated blood is tested for signs of infectious diseases, such as HIV and hepatitis. The blood is then tested to be sure it is compatible with you in order to minimize the chance of a transfusion reaction. If you or a relative donates blood, this is often done in anticipation of surgery and is not appropriate for emergency situations. It takes many days to process the donated blood. RISKS AND COMPLICATIONS Although transfusion therapy is very safe and saves many lives, the main dangers of transfusion include:   Getting an infectious disease.  Developing a transfusion reaction. This is an allergic reaction to  something in the blood you were given. Every precaution is taken to prevent this. The decision to have a blood transfusion has been considered carefully by your caregiver before blood is given. Blood is not given unless the benefits outweigh the risks. AFTER THE TRANSFUSION  Right after receiving a blood transfusion, you will usually feel much better and more energetic. This is especially true if your red blood cells have gotten low (anemic). The transfusion raises the level of the red blood cells which carry oxygen, and this usually causes an energy increase.  The nurse administering the transfusion will monitor you carefully for complications. HOME CARE INSTRUCTIONS  No special  instructions are needed after a transfusion. You Zaro find your energy is better. Speak with your caregiver about any limitations on activity for underlying diseases you Davilla have. SEEK MEDICAL CARE IF:   Your condition is not improving after your transfusion.  You develop redness or irritation at the intravenous (IV) site. SEEK IMMEDIATE MEDICAL CARE IF:  Any of the following symptoms occur over the next 12 hours:  Shaking chills.  You have a temperature by mouth above 102 F (38.9 C), not controlled by medicine.  Chest, back, or muscle pain.  People around you feel you are not acting correctly or are confused.  Shortness of breath or difficulty breathing.  Dizziness and fainting.  You get a rash or develop hives.  You have a decrease in urine output.  Your urine turns a dark color or changes to pink, red, or brown. Any of the following symptoms occur over the next 10 days:  You have a temperature by mouth above 102 F (38.9 C), not controlled by medicine.  Shortness of breath.  Weakness after normal activity.  The white part of the eye turns yellow (jaundice).  You have a decrease in the amount of urine or are urinating less often.  Your urine turns a dark color or changes to pink, red, or brown. Document Released: 01/16/2000 Document Revised: 04/12/2011 Document Reviewed: 09/04/2007 [facility] Patient Information 2014 [place], [place].  _______________________________________________________________________

## 2015-02-21 ENCOUNTER — Encounter (HOSPITAL_COMMUNITY): Payer: Self-pay

## 2015-02-21 ENCOUNTER — Encounter (HOSPITAL_COMMUNITY)
Admission: RE | Admit: 2015-02-21 | Discharge: 2015-02-21 | Disposition: A | Discharge status: Home / Self Care | Payer: Medicare Other | Source: Ambulatory Visit | Attending: Orthopaedic Surgery | Admitting: Orthopaedic Surgery

## 2015-02-21 DIAGNOSIS — Z01812 Encounter for preprocedural laboratory examination: Secondary | ICD-10-CM | POA: Diagnosis not present

## 2015-02-21 DIAGNOSIS — M1711 Unilateral primary osteoarthritis, right knee: Secondary | ICD-10-CM | POA: Diagnosis not present

## 2015-02-21 DIAGNOSIS — R9431 Abnormal electrocardiogram [ECG] [EKG]: Secondary | ICD-10-CM | POA: Insufficient documentation

## 2015-02-21 DIAGNOSIS — Z0183 Encounter for blood typing: Secondary | ICD-10-CM | POA: Insufficient documentation

## 2015-02-21 DIAGNOSIS — Z01818 Encounter for other preprocedural examination: Secondary | ICD-10-CM | POA: Diagnosis present

## 2015-02-21 DIAGNOSIS — I4510 Unspecified right bundle-branch block: Secondary | ICD-10-CM | POA: Diagnosis not present

## 2015-02-21 DIAGNOSIS — E119 Type 2 diabetes mellitus without complications: Secondary | ICD-10-CM | POA: Diagnosis not present

## 2015-02-21 HISTORY — DX: Type 2 diabetes mellitus with diabetic neuropathy, unspecified: E11.40

## 2015-02-21 HISTORY — DX: Unspecified osteoarthritis, unspecified site: M19.90

## 2015-02-21 HISTORY — DX: Disease of blood and blood-forming organs, unspecified: D75.9

## 2015-02-21 LAB — SURGICAL PCR SCREEN
MRSA, PCR: NEGATIVE
Staphylococcus aureus: NEGATIVE

## 2015-02-21 LAB — BASIC METABOLIC PANEL
Anion gap: 11 (ref 5–15)
BUN: 19 mg/dL (ref 6–20)
CO2: 23 mmol/L (ref 22–32)
Calcium: 10.1 mg/dL (ref 8.9–10.3)
Chloride: 108 mmol/L (ref 101–111)
Creatinine, Ser: 0.79 mg/dL (ref 0.44–1.00)
GFR calc Af Amer: >60 mL/min (ref >60)
GFR calc non Af Amer: >60 mL/min (ref >60)
Glucose, Bld: 130 mg/dL — ABNORMAL HIGH (ref 65–99)
Potassium: 3.7 mmol/L (ref 3.5–5.1)
Sodium: 142 mmol/L (ref 135–145)

## 2015-02-21 LAB — CBC
HCT: 37.2 % (ref 36.0–46.0)
Hemoglobin: 11.9 g/dL — ABNORMAL LOW (ref 12.0–15.0)
MCH: 27.9 pg (ref 26.0–34.0)
MCHC: 32 g/dL (ref 30.0–36.0)
MCV: 87.1 fL (ref 78.0–100.0)
Platelets: 107 10*3/uL — ABNORMAL LOW (ref 150–400)
RBC: 4.27 MIL/uL (ref 3.87–5.11)
RDW: 14.5 % (ref 11.5–15.5)
WBC: 4.4 10*3/uL (ref 4.0–10.5)

## 2015-02-22 LAB — HEMOGLOBIN A1C
HEMOGLOBIN A1C: 6.6 % — AB (ref 4.8–5.6)
Mean Plasma Glucose: 143 mg/dL

## 2015-02-28 ENCOUNTER — Inpatient Hospital Stay (HOSPITAL_COMMUNITY)
Admission: RE | Admit: 2015-02-28 | Discharge: 2015-03-03 | DRG: 470 | Disposition: A | Discharge status: Home Health | Payer: Medicare Other | Source: Ambulatory Visit | Attending: Orthopaedic Surgery | Admitting: Orthopaedic Surgery

## 2015-02-28 ENCOUNTER — Inpatient Hospital Stay (HOSPITAL_COMMUNITY): Payer: Medicare Other | Admitting: Anesthesiology

## 2015-02-28 ENCOUNTER — Inpatient Hospital Stay (HOSPITAL_COMMUNITY): Payer: Medicare Other

## 2015-02-28 ENCOUNTER — Encounter (HOSPITAL_COMMUNITY)
Admission: RE | Disposition: A | Discharge status: Home Health | Payer: Self-pay | Source: Ambulatory Visit | Attending: Orthopaedic Surgery

## 2015-02-28 ENCOUNTER — Encounter (HOSPITAL_COMMUNITY): Payer: Self-pay | Admitting: *Deleted

## 2015-02-28 DIAGNOSIS — Z79899 Other long term (current) drug therapy: Secondary | ICD-10-CM

## 2015-02-28 DIAGNOSIS — Z01812 Encounter for preprocedural laboratory examination: Secondary | ICD-10-CM | POA: Diagnosis not present

## 2015-02-28 DIAGNOSIS — Z9889 Other specified postprocedural states: Secondary | ICD-10-CM | POA: Diagnosis not present

## 2015-02-28 DIAGNOSIS — I10 Essential (primary) hypertension: Secondary | ICD-10-CM | POA: Diagnosis not present

## 2015-02-28 DIAGNOSIS — Z6841 Body Mass Index (BMI) 40.0 and over, adult: Secondary | ICD-10-CM | POA: Diagnosis not present

## 2015-02-28 DIAGNOSIS — E079 Disorder of thyroid, unspecified: Secondary | ICD-10-CM | POA: Diagnosis present

## 2015-02-28 DIAGNOSIS — Z96659 Presence of unspecified artificial knee joint: Secondary | ICD-10-CM | POA: Insufficient documentation

## 2015-02-28 DIAGNOSIS — E1140 Type 2 diabetes mellitus with diabetic neuropathy, unspecified: Secondary | ICD-10-CM | POA: Diagnosis present

## 2015-02-28 DIAGNOSIS — M179 Osteoarthritis of knee, unspecified: Secondary | ICD-10-CM | POA: Diagnosis not present

## 2015-02-28 DIAGNOSIS — G4730 Sleep apnea, unspecified: Secondary | ICD-10-CM | POA: Diagnosis present

## 2015-02-28 DIAGNOSIS — Z96651 Presence of right artificial knee joint: Secondary | ICD-10-CM

## 2015-02-28 DIAGNOSIS — D62 Acute posthemorrhagic anemia: Secondary | ICD-10-CM | POA: Diagnosis not present

## 2015-02-28 DIAGNOSIS — G8918 Other acute postprocedural pain: Secondary | ICD-10-CM | POA: Diagnosis not present

## 2015-02-28 DIAGNOSIS — F419 Anxiety disorder, unspecified: Secondary | ICD-10-CM | POA: Diagnosis present

## 2015-02-28 DIAGNOSIS — M1711 Unilateral primary osteoarthritis, right knee: Secondary | ICD-10-CM | POA: Diagnosis present

## 2015-02-28 DIAGNOSIS — Z471 Aftercare following joint replacement surgery: Secondary | ICD-10-CM | POA: Diagnosis not present

## 2015-02-28 DIAGNOSIS — Z419 Encounter for procedure for purposes other than remedying health state, unspecified: Secondary | ICD-10-CM

## 2015-02-28 DIAGNOSIS — M25561 Pain in right knee: Secondary | ICD-10-CM | POA: Diagnosis not present

## 2015-02-28 HISTORY — PX: TOTAL KNEE ARTHROPLASTY: SHX125

## 2015-02-28 LAB — GLUCOSE, CAPILLARY
Glucose-Capillary: 129 mg/dL — ABNORMAL HIGH (ref 65–99)
Glucose-Capillary: 184 mg/dL — ABNORMAL HIGH (ref 65–99)
Glucose-Capillary: 258 mg/dL — ABNORMAL HIGH (ref 65–99)

## 2015-02-28 LAB — CBC WITH DIFFERENTIAL/PLATELET
BASOS ABS: 0.1 10*3/uL (ref 0.0–0.1)
BASOS PCT: 1 %
EOS PCT: 4 %
Eosinophils Absolute: 0.2 10*3/uL (ref 0.0–0.7)
HEMATOCRIT: 37.1 % (ref 36.0–46.0)
Hemoglobin: 11.9 g/dL — ABNORMAL LOW (ref 12.0–15.0)
LYMPHS PCT: 28 %
Lymphs Abs: 1.6 10*3/uL (ref 0.7–4.0)
MCH: 27.8 pg (ref 26.0–34.0)
MCHC: 32.1 g/dL (ref 30.0–36.0)
MCV: 86.7 fL (ref 78.0–100.0)
MONO ABS: 0.4 10*3/uL (ref 0.1–1.0)
MONOS PCT: 7 %
NEUTROS ABS: 3.3 10*3/uL (ref 1.7–7.7)
Neutrophils Relative %: 60 %
PLATELETS: 105 10*3/uL — AB (ref 150–400)
RBC: 4.28 MIL/uL (ref 3.87–5.11)
RDW: 14.5 % (ref 11.5–15.5)
WBC: 5.6 10*3/uL (ref 4.0–10.5)

## 2015-02-28 LAB — APTT: aPTT: 30 seconds (ref 24–37)

## 2015-02-28 LAB — TYPE AND SCREEN
ABO/RH(D): O POS
Antibody Screen: NEGATIVE

## 2015-02-28 LAB — PROTIME-INR
INR: 1.35 (ref 0.00–1.49)
Prothrombin Time: 16.3 seconds — ABNORMAL HIGH (ref 11.6–15.2)

## 2015-02-28 SURGERY — ARTHROPLASTY, KNEE, TOTAL
Anesthesia: General | Site: Knee | Laterality: Right

## 2015-02-28 MED ORDER — LOSARTAN POTASSIUM 50 MG PO TABS
50.0000 mg | ORAL_TABLET | Freq: Every morning | ORAL | Status: DC
  Administered 2015-03-01 – 2015-03-03 (×3): 50 mg via ORAL
  Filled 2015-02-28 (×4): qty 1

## 2015-02-28 MED ORDER — FENTANYL CITRATE (PF) 100 MCG/2ML IJ SOLN
INTRAMUSCULAR | Status: DC | PRN
  Administered 2015-02-28: 100 ug via INTRAVENOUS

## 2015-02-28 MED ORDER — ALUM & MAG HYDROXIDE-SIMETH 200-200-20 MG/5ML PO SUSP: 30.0000 mL | ORAL | Status: DC | PRN

## 2015-02-28 MED ORDER — BUPIVACAINE-EPINEPHRINE (PF) 0.5% -1:200000 IJ SOLN
INTRAMUSCULAR | Status: DC | PRN
  Administered 2015-02-28: 25 mL

## 2015-02-28 MED ORDER — ROCURONIUM BROMIDE 100 MG/10ML IV SOLN
INTRAVENOUS | Status: DC | PRN
  Administered 2015-02-28: 5 mg via INTRAVENOUS
  Administered 2015-02-28: 25 mg via INTRAVENOUS

## 2015-02-28 MED ORDER — CEFAZOLIN SODIUM 1-5 GM-% IV SOLN
1.0000 g | Freq: Four times a day (QID) | INTRAVENOUS | Status: AC
Start: 2015-02-28 — End: 2015-02-28
  Administered 2015-02-28 (×2): 1 g via INTRAVENOUS
  Filled 2015-02-28 (×2): qty 50

## 2015-02-28 MED ORDER — FUROSEMIDE 20 MG PO TABS
20.0000 mg | ORAL_TABLET | Freq: Every day | ORAL | Status: DC | PRN
  Filled 2015-02-28: qty 1

## 2015-02-28 MED ORDER — DIPHENHYDRAMINE HCL 12.5 MG/5ML PO ELIX: 12.5000 mg | ORAL_SOLUTION | ORAL | Status: DC | PRN

## 2015-02-28 MED ORDER — FENTANYL CITRATE (PF) 100 MCG/2ML IJ SOLN
INTRAMUSCULAR | Status: AC
  Filled 2015-02-28: qty 2

## 2015-02-28 MED ORDER — CITALOPRAM HYDROBROMIDE 20 MG PO TABS
20.0000 mg | ORAL_TABLET | Freq: Every day | ORAL | Status: DC
  Administered 2015-03-01 – 2015-03-03 (×3): 20 mg via ORAL
  Filled 2015-02-28 (×3): qty 1

## 2015-02-28 MED ORDER — ONDANSETRON HCL 4 MG/2ML IJ SOLN
INTRAMUSCULAR | Status: AC
  Filled 2015-02-28: qty 2

## 2015-02-28 MED ORDER — OXYCODONE HCL 5 MG PO TABS
5.0000 mg | ORAL_TABLET | ORAL | Status: DC | PRN
  Administered 2015-02-28: 10 mg via ORAL
  Administered 2015-02-28: 15 mg via ORAL
  Administered 2015-02-28: 5 mg via ORAL
  Administered 2015-02-28 – 2015-03-03 (×10): 15 mg via ORAL
  Filled 2015-02-28 (×3): qty 3
  Filled 2015-02-28: qty 2
  Filled 2015-02-28 (×2): qty 3
  Filled 2015-02-28: qty 2
  Filled 2015-02-28: qty 3
  Filled 2015-02-28: qty 1
  Filled 2015-02-28 (×5): qty 3

## 2015-02-28 MED ORDER — FENTANYL CITRATE (PF) 100 MCG/2ML IJ SOLN
25.0000 ug | INTRAMUSCULAR | Status: DC | PRN
  Administered 2015-02-28 (×2): 50 ug via INTRAVENOUS

## 2015-02-28 MED ORDER — RIVAROXABAN 10 MG PO TABS
10.0000 mg | ORAL_TABLET | Freq: Every day | ORAL | Status: DC
Start: 2015-03-01 — End: 2015-03-03
  Administered 2015-03-01 – 2015-03-03 (×3): 10 mg via ORAL
  Filled 2015-02-28 (×4): qty 1

## 2015-02-28 MED ORDER — LIDOCAINE HCL (CARDIAC) 20 MG/ML IV SOLN
INTRAVENOUS | Status: DC | PRN
  Administered 2015-02-28: 50 mg via INTRAVENOUS

## 2015-02-28 MED ORDER — SUGAMMADEX SODIUM 200 MG/2ML IV SOLN
INTRAVENOUS | Status: DC | PRN
Start: 2015-02-28 — End: 2015-02-28
  Administered 2015-02-28: 200 mg via INTRAVENOUS

## 2015-02-28 MED ORDER — ROCURONIUM BROMIDE 100 MG/10ML IV SOLN
INTRAVENOUS | Status: AC
  Filled 2015-02-28: qty 1

## 2015-02-28 MED ORDER — ALLOPURINOL 100 MG PO TABS
100.0000 mg | ORAL_TABLET | Freq: Every day | ORAL | Status: DC
Start: 2015-02-28 — End: 2015-03-03
  Administered 2015-02-28 – 2015-03-03 (×4): 100 mg via ORAL
  Filled 2015-02-28 (×4): qty 1

## 2015-02-28 MED ORDER — ONDANSETRON HCL 4 MG/2ML IJ SOLN: 4.0000 mg | Freq: Once | INTRAMUSCULAR | Status: DC | PRN

## 2015-02-28 MED ORDER — SUCCINYLCHOLINE CHLORIDE 20 MG/ML IJ SOLN
INTRAMUSCULAR | Status: DC | PRN
  Administered 2015-02-28: 100 mg via INTRAVENOUS

## 2015-02-28 MED ORDER — EPHEDRINE SULFATE 50 MG/ML IJ SOLN
INTRAMUSCULAR | Status: DC | PRN
  Administered 2015-02-28 (×2): 5 mg via INTRAVENOUS

## 2015-02-28 MED ORDER — CEFAZOLIN SODIUM-DEXTROSE 2-3 GM-% IV SOLR
2.0000 g | INTRAVENOUS | Status: AC
  Administered 2015-02-28: 2 g via INTRAVENOUS

## 2015-02-28 MED ORDER — LACTATED RINGERS IV SOLN
INTRAVENOUS | Status: DC
  Administered 2015-02-28: 1000 mL via INTRAVENOUS

## 2015-02-28 MED ORDER — LIRAGLUTIDE 18 MG/3ML ~~LOC~~ SOPN
1.8000 mg | PEN_INJECTOR | Freq: Every day | SUBCUTANEOUS | Status: DC
  Administered 2015-03-01 – 2015-03-03 (×3): 1.8 mg via SUBCUTANEOUS

## 2015-02-28 MED ORDER — ACETAMINOPHEN 650 MG RE SUPP: 650.0000 mg | Freq: Four times a day (QID) | RECTAL | Status: DC | PRN

## 2015-02-28 MED ORDER — HYDROMORPHONE HCL 2 MG/ML IJ SOLN
INTRAMUSCULAR | Status: AC
  Filled 2015-02-28: qty 1

## 2015-02-28 MED ORDER — GABAPENTIN 300 MG PO CAPS
300.0000 mg | ORAL_CAPSULE | Freq: Two times a day (BID) | ORAL | Status: DC
  Administered 2015-02-28 – 2015-03-03 (×6): 300 mg via ORAL
  Filled 2015-02-28 (×8): qty 1

## 2015-02-28 MED ORDER — DOCUSATE SODIUM 100 MG PO CAPS
100.0000 mg | ORAL_CAPSULE | Freq: Two times a day (BID) | ORAL | Status: DC
  Administered 2015-02-28 – 2015-03-03 (×6): 100 mg via ORAL
  Filled 2015-02-28 (×3): qty 1

## 2015-02-28 MED ORDER — STERILE WATER FOR IRRIGATION IR SOLN
Status: DC | PRN
  Administered 2015-02-28: 1000 mL

## 2015-02-28 MED ORDER — HYDROMORPHONE HCL 1 MG/ML IJ SOLN
INTRAMUSCULAR | Status: DC | PRN
  Administered 2015-02-28 (×2): 1 mg via INTRAVENOUS

## 2015-02-28 MED ORDER — ONDANSETRON HCL 4 MG/2ML IJ SOLN: 4.0000 mg | Freq: Four times a day (QID) | INTRAMUSCULAR | Status: DC | PRN

## 2015-02-28 MED ORDER — DEXAMETHASONE SODIUM PHOSPHATE 10 MG/ML IJ SOLN
INTRAMUSCULAR | Status: DC | PRN
  Administered 2015-02-28: 10 mg via INTRAVENOUS

## 2015-02-28 MED ORDER — METHOCARBAMOL 500 MG PO TABS
500.0000 mg | ORAL_TABLET | Freq: Four times a day (QID) | ORAL | Status: DC | PRN
  Administered 2015-02-28 – 2015-03-02 (×5): 500 mg via ORAL
  Filled 2015-02-28 (×5): qty 1

## 2015-02-28 MED ORDER — METHOCARBAMOL 1000 MG/10ML IJ SOLN
500.0000 mg | Freq: Four times a day (QID) | INTRAVENOUS | Status: DC | PRN
  Administered 2015-02-28: 500 mg via INTRAVENOUS
  Filled 2015-02-28 (×2): qty 5

## 2015-02-28 MED ORDER — ZOLPIDEM TARTRATE 5 MG PO TABS: 5.0000 mg | ORAL_TABLET | Freq: Every evening | ORAL | Status: DC | PRN

## 2015-02-28 MED ORDER — SODIUM CHLORIDE 0.9 % IV SOLN
INTRAVENOUS | Status: DC
  Administered 2015-02-28 – 2015-03-01 (×2): via INTRAVENOUS

## 2015-02-28 MED ORDER — SODIUM CHLORIDE 0.9 % IR SOLN
Status: DC | PRN
  Administered 2015-02-28: 3000 mL

## 2015-02-28 MED ORDER — DEXAMETHASONE SODIUM PHOSPHATE 10 MG/ML IJ SOLN
INTRAMUSCULAR | Status: AC
  Filled 2015-02-28: qty 1

## 2015-02-28 MED ORDER — MIDAZOLAM HCL 2 MG/2ML IJ SOLN
INTRAMUSCULAR | Status: AC
  Filled 2015-02-28: qty 2

## 2015-02-28 MED ORDER — LIDOCAINE HCL (CARDIAC) 20 MG/ML IV SOLN
INTRAVENOUS | Status: AC
  Filled 2015-02-28: qty 5

## 2015-02-28 MED ORDER — METOCLOPRAMIDE HCL 5 MG/ML IJ SOLN: 5.0000 mg | Freq: Three times a day (TID) | INTRAMUSCULAR | Status: DC | PRN

## 2015-02-28 MED ORDER — CEFAZOLIN SODIUM-DEXTROSE 2-3 GM-% IV SOLR
INTRAVENOUS | Status: AC
  Filled 2015-02-28: qty 50

## 2015-02-28 MED ORDER — PROPOFOL 10 MG/ML IV BOLUS
INTRAVENOUS | Status: DC | PRN
  Administered 2015-02-28: 180 mg via INTRAVENOUS

## 2015-02-28 MED ORDER — ACETAMINOPHEN 325 MG PO TABS
650.0000 mg | ORAL_TABLET | Freq: Four times a day (QID) | ORAL | Status: DC | PRN
  Administered 2015-03-02: 650 mg via ORAL
  Filled 2015-02-28: qty 2

## 2015-02-28 MED ORDER — LORATADINE 10 MG PO TABS
10.0000 mg | ORAL_TABLET | Freq: Every day | ORAL | Status: DC
  Administered 2015-02-28 – 2015-03-03 (×4): 10 mg via ORAL
  Filled 2015-02-28 (×4): qty 1

## 2015-02-28 MED ORDER — SUGAMMADEX SODIUM 200 MG/2ML IV SOLN
INTRAVENOUS | Status: AC
  Filled 2015-02-28: qty 2

## 2015-02-28 MED ORDER — BUPIVACAINE-EPINEPHRINE (PF) 0.5% -1:200000 IJ SOLN
INTRAMUSCULAR | Status: AC
  Filled 2015-02-28: qty 30

## 2015-02-28 MED ORDER — POLYETHYLENE GLYCOL 3350 17 G PO PACK
17.0000 g | PACK | Freq: Every day | ORAL | Status: DC | PRN
  Administered 2015-03-01 – 2015-03-02 (×2): 17 g via ORAL
  Filled 2015-02-28 (×2): qty 1

## 2015-02-28 MED ORDER — GABAPENTIN 300 MG PO CAPS
600.0000 mg | ORAL_CAPSULE | Freq: Every day | ORAL | Status: DC
  Administered 2015-02-28 – 2015-03-02 (×3): 600 mg via ORAL
  Filled 2015-02-28 (×4): qty 2

## 2015-02-28 MED ORDER — LACTATED RINGERS IV SOLN
INTRAVENOUS | Status: DC | PRN
  Administered 2015-02-28 (×2): via INTRAVENOUS

## 2015-02-28 MED ORDER — LEVOTHYROXINE SODIUM 175 MCG PO TABS
175.0000 ug | ORAL_TABLET | Freq: Every day | ORAL | Status: DC
  Administered 2015-03-01 – 2015-03-03 (×3): 175 ug via ORAL
  Filled 2015-02-28 (×4): qty 1

## 2015-02-28 MED ORDER — MENTHOL 3 MG MT LOZG: 1.0000 | LOZENGE | OROMUCOSAL | Status: DC | PRN

## 2015-02-28 MED ORDER — ONDANSETRON HCL 4 MG PO TABS: 4.0000 mg | ORAL_TABLET | Freq: Four times a day (QID) | ORAL | Status: DC | PRN

## 2015-02-28 MED ORDER — PROPOFOL 10 MG/ML IV BOLUS
INTRAVENOUS | Status: AC
  Filled 2015-02-28: qty 20

## 2015-02-28 MED ORDER — PHENOL 1.4 % MT LIQD: 1.0000 | OROMUCOSAL | Status: DC | PRN

## 2015-02-28 MED ORDER — METOCLOPRAMIDE HCL 10 MG PO TABS: 5.0000 mg | ORAL_TABLET | Freq: Three times a day (TID) | ORAL | Status: DC | PRN

## 2015-02-28 MED ORDER — ONDANSETRON HCL 4 MG/2ML IJ SOLN
INTRAMUSCULAR | Status: DC | PRN
  Administered 2015-02-28: 4 mg via INTRAVENOUS

## 2015-02-28 MED ORDER — 0.9 % SODIUM CHLORIDE (POUR BTL) OPTIME
TOPICAL | Status: DC | PRN
  Administered 2015-02-28: 1000 mL

## 2015-02-28 MED ORDER — TRANEXAMIC ACID 1000 MG/10ML IV SOLN
1000.0000 mg | INTRAVENOUS | Status: AC
  Administered 2015-02-28: 1000 mg via INTRAVENOUS
  Filled 2015-02-28: qty 10

## 2015-02-28 MED ORDER — HYDROMORPHONE HCL 1 MG/ML IJ SOLN
1.0000 mg | INTRAMUSCULAR | Status: DC | PRN
  Administered 2015-02-28 – 2015-03-01 (×2): 1 mg via INTRAVENOUS
  Filled 2015-02-28 (×2): qty 1

## 2015-02-28 MED ORDER — MIDAZOLAM HCL 5 MG/5ML IJ SOLN
INTRAMUSCULAR | Status: DC | PRN
  Administered 2015-02-28 (×2): 1 mg via INTRAVENOUS

## 2015-02-28 SURGICAL SUPPLY — 55 items
APL SKNCLS STERI-STRIP NONHPOA (GAUZE/BANDAGES/DRESSINGS)
BAG SPEC THK2 15X12 ZIP CLS (MISCELLANEOUS) ×1
BAG ZIPLOCK 12X15 (MISCELLANEOUS) ×2 IMPLANT
BANDAGE ACE 6X5 VEL STRL LF (GAUZE/BANDAGES/DRESSINGS) ×2 IMPLANT
BENZOIN TINCTURE PRP APPL 2/3 (GAUZE/BANDAGES/DRESSINGS) IMPLANT
BLADE SAG 13.0X1.37X90 (BLADE) IMPLANT
BLADE SAG 18X100X1.27 (BLADE) IMPLANT
BOWL SMART MIX CTS (DISPOSABLE) ×2 IMPLANT
CAPT KNEE TOTAL 3 ×2 IMPLANT
CEMENT BONE 1-PACK (Cement) ×2 IMPLANT
CLOTH BEACON ORANGE TIMEOUT ST (SAFETY) ×2 IMPLANT
CUFF TOURN SGL QUICK 34 (TOURNIQUET CUFF) ×2
CUFF TRNQT CYL 34X4X40X1 (TOURNIQUET CUFF) ×1 IMPLANT
DRAPE U-SHAPE 47X51 STRL (DRAPES) ×2 IMPLANT
DRSG AQUACEL AG ADV 3.5X10 (GAUZE/BANDAGES/DRESSINGS) IMPLANT
DRSG PAD ABDOMINAL 8X10 ST (GAUZE/BANDAGES/DRESSINGS) IMPLANT
DURAPREP 26ML APPLICATOR (WOUND CARE) ×2 IMPLANT
ELECT REM PT RETURN 9FT ADLT (ELECTROSURGICAL) ×2
ELECTRODE REM PT RTRN 9FT ADLT (ELECTROSURGICAL) ×1 IMPLANT
GAUZE SPONGE 4X4 12PLY STRL (GAUZE/BANDAGES/DRESSINGS) ×2 IMPLANT
GAUZE XEROFORM 1X8 LF (GAUZE/BANDAGES/DRESSINGS) ×2 IMPLANT
GLOVE BIO SURGEON STRL SZ7.5 (GLOVE) ×4 IMPLANT
GLOVE BIOGEL PI IND STRL 8 (GLOVE) ×2 IMPLANT
GLOVE BIOGEL PI INDICATOR 8 (GLOVE) ×2
GLOVE ECLIPSE 8.0 STRL XLNG CF (GLOVE) ×4 IMPLANT
GOWN STRL REUS W/TWL XL LVL3 (GOWN DISPOSABLE) ×4 IMPLANT
HANDPIECE INTERPULSE COAX TIP (DISPOSABLE) ×2
IMMOBILIZER KNEE 20 (SOFTGOODS) ×2
IMMOBILIZER KNEE 20 THIGH 36 (SOFTGOODS) ×1 IMPLANT
NS IRRIG 1000ML POUR BTL (IV SOLUTION) ×2 IMPLANT
PACK TOTAL KNEE CUSTOM (KITS) ×2 IMPLANT
PAD ABD 8X10 STRL (GAUZE/BANDAGES/DRESSINGS) ×4 IMPLANT
PADDING CAST COTTON 6X4 STRL (CAST SUPPLIES) ×4 IMPLANT
POSITIONER SURGICAL ARM (MISCELLANEOUS) ×2 IMPLANT
SET HNDPC FAN SPRY TIP SCT (DISPOSABLE) ×1 IMPLANT
SET PAD KNEE POSITIONER (MISCELLANEOUS) ×2 IMPLANT
STAPLER VISISTAT 35W (STAPLE) IMPLANT
STRIP CLOSURE SKIN 1/2X4 (GAUZE/BANDAGES/DRESSINGS) ×4 IMPLANT
SUCTION FRAZIER 12FR DISP (SUCTIONS) IMPLANT
SUCTION FRAZIER HANDLE 10FR (MISCELLANEOUS) ×1
SUCTION TUBE FRAZIER 10FR DISP (MISCELLANEOUS) ×1 IMPLANT
SUT MNCRL AB 4-0 PS2 18 (SUTURE) IMPLANT
SUT VIC AB 0 CT1 27 (SUTURE) ×2
SUT VIC AB 0 CT1 27XBRD ANTBC (SUTURE) ×1 IMPLANT
SUT VIC AB 0 CT1 36 (SUTURE) ×2 IMPLANT
SUT VIC AB 1 CT1 27 (SUTURE) ×4
SUT VIC AB 1 CT1 27XBRD ANTBC (SUTURE) ×2 IMPLANT
SUT VIC AB 2-0 CT1 27 (SUTURE) ×4
SUT VIC AB 2-0 CT1 TAPERPNT 27 (SUTURE) ×2 IMPLANT
TIP HIGH FLOW IRRIGATION COAX (MISCELLANEOUS) ×2 IMPLANT
TRAY FOLEY W/METER SILVER 14FR (SET/KITS/TRAYS/PACK) IMPLANT
TRAY FOLEY W/METER SILVER 16FR (SET/KITS/TRAYS/PACK) IMPLANT
WATER STERILE IRR 1500ML POUR (IV SOLUTION) IMPLANT
WRAP KNEE MAXI GEL POST OP (GAUZE/BANDAGES/DRESSINGS) ×2 IMPLANT
YANKAUER SUCT BULB TIP 10FT TU (MISCELLANEOUS) ×2 IMPLANT

## 2015-02-28 NOTE — Anesthesia Procedure Notes (Addendum)
Anesthesia Regional Block:  Adductor canal block  Pre-Anesthetic Checklist: ,, timeout performed, Correct Patient, Correct Site, Correct Laterality, Correct Procedure, Correct Position, site marked, Risks and benefits discussed,  Surgical consent,  Pre-op evaluation,  At surgeon's request and post-op pain management  Laterality: Right  Prep: chloraprep       Needles:  Injection technique: Single-shot  Needle Type: Echogenic Stimulator Needle      Needle Gauge: 21 and 21 G    Additional Needles:  Procedures: ultrasound guided (picture in chart) Adductor canal block Narrative:  Injection made incrementally with aspirations every 5 mL.  Performed by: Personally  Anesthesiologist: Lauretta Grill  Additional Notes: Patient tolerated the procedure well without complications   Procedure Name: Intubation Date/Time: 02/28/2015 7:45 AM Performed by: Noralyn Pick D Pre-anesthesia Checklist: Patient identified, Emergency Drugs available, Suction available and Patient being monitored Patient Re-evaluated:Patient Re-evaluated prior to inductionOxygen Delivery Method: Circle System Utilized Preoxygenation: Pre-oxygenation with 100% oxygen Intubation Type: IV induction Ventilation: Mask ventilation without difficulty Laryngoscope Size: Mac and 3 Grade View: Grade I Tube type: Oral Tube size: 7.0 mm Number of attempts: 1 Airway Equipment and Method: Stylet and Oral airway Placement Confirmation: ETT inserted through vocal cords under direct vision,  positive ETCO2 and breath sounds checked- equal and bilateral Secured at: 20 cm Tube secured with: Tape Dental Injury: Teeth and Oropharynx as per pre-operative assessment

## 2015-02-28 NOTE — Progress Notes (Signed)
Assisted Dr. M. Judd with right, ultrasound guided, adductor canal block. Side rails up, monitors on throughout procedure. See vital signs in flow sheet. Tolerated Procedure well.  

## 2015-02-28 NOTE — Discharge Instructions (Addendum)
Information on my medicine - XARELTO® (Rivaroxaban) ° °This medication education was reviewed with me or my healthcare representative as part of my discharge preparation.  The pharmacist that spoke with me during my hospital stay was:  Absher, Randall K, RPH ° °Why was Xarelto® prescribed for you? °Xarelto® was prescribed for you to reduce the risk of blood clots forming after orthopedic surgery. The medical term for these abnormal blood clots is venous thromboembolism (VTE). ° °What do you need to know about xarelto® ? °Take your Xarelto® ONCE DAILY at the same time every day. °You Dick take it either with or without food. ° °If you have difficulty swallowing the tablet whole, you Simmon crush it and mix in applesauce just prior to taking your dose. ° °Take Xarelto® exactly as prescribed by your doctor and DO NOT stop taking Xarelto® without talking to the doctor who prescribed the medication.  Stopping without other VTE prevention medication to take the place of Xarelto® Bechler increase your risk of developing a clot. ° °After discharge, you should have regular check-up appointments with your healthcare provider that is prescribing your Xarelto®.   ° °What do you do if you miss a dose? °If you miss a dose, take it as soon as you remember on the same day then continue your regularly scheduled once daily regimen the next day. Do not take two doses of Xarelto® on the same day.  ° °Important Safety Information °A possible side effect of Xarelto® is bleeding. You should call your healthcare provider right away if you experience any of the following: °? Bleeding from an injury or your nose that does not stop. °? Unusual colored urine (red or dark brown) or unusual colored stools (red or black). °? Unusual bruising for unknown reasons. °? A serious fall or if you hit your head (even if there is no bleeding). ° °Some medicines [place] interact with Xarelto® and might increase your risk of bleeding while on Xarelto®. To help avoid  this, consult your healthcare provider or pharmacist prior to using any new prescription or non-prescription medications, including herbals, vitamins, non-steroidal anti-inflammatory drugs (NSAIDs) and supplements. ° °This website has more information on Xarelto®: www.xarelto.com. ° °INSTRUCTIONS AFTER JOINT REPLACEMENT  ° °o Remove items at home which could result in a fall. This includes throw rugs or furniture in walking pathways °o ICE to the affected joint every three hours while awake for 30 minutes at a time, for at least the first 3-5 days, and then as needed for pain and swelling.  Continue to use ice for pain and swelling. You Coplen notice swelling that will progress down to the foot and ankle.  This is normal after surgery.  Elevate your leg when you are not up walking on it.   °o Continue to use the breathing machine you got in the hospital (incentive spirometer) which will help keep your temperature down.  It is common for your temperature to cycle up and down following surgery, especially at night when you are not up moving around and exerting yourself.  The breathing machine keeps your lungs expanded and your temperature down. ° ° °DIET:  As you were doing prior to hospitalization, we recommend a well-balanced diet. ° °DRESSING / WOUND CARE / SHOWERING ° °Keep the surgical dressing until follow up.  The dressing is water proof, so you can shower without any extra covering.  IF THE DRESSING FALLS OFF or the wound gets wet inside, change the dressing with sterile gauze.    Please use good hand washing techniques before changing the dressing.  Do not use any lotions or creams on the incision until instructed by your surgeon.   ° °ACTIVITY ° °o Increase activity slowly as tolerated, but follow the weight bearing instructions below.   °o No driving for 6 weeks or until further direction given by your physician.  You cannot drive while taking narcotics.  °o No lifting or carrying greater than 10 lbs. until  further directed by your surgeon. °o Avoid periods of inactivity such as sitting longer than an hour when not asleep. This helps prevent blood clots.  °o You Yepiz return to work once you are authorized by your doctor.  ° ° ° °WEIGHT BEARING  ° °Weight bearing as tolerated with assist device (walker, cane, etc) as directed, use it as long as suggested by your surgeon or therapist, typically at least 4-6 weeks. ° ° °EXERCISES ° °Results after joint replacement surgery are often greatly improved when you follow the exercise, range of motion and muscle strengthening exercises prescribed by your doctor. Safety measures are also important to protect the joint from further injury. Any time any of these exercises cause you to have increased pain or swelling, decrease what you are doing until you are comfortable again and then slowly increase them. If you have problems or questions, call your caregiver or physical therapist for advice.  ° °Rehabilitation is important following a joint replacement. After just a few days of immobilization, the muscles of the leg can become weakened and shrink (atrophy).  These exercises are designed to build up the tone and strength of the thigh and leg muscles and to improve motion. Often times heat used for twenty to thirty minutes before working out will loosen up your tissues and help with improving the range of motion but do not use heat for the first two weeks following surgery (sometimes heat can increase post-operative swelling).  ° °These exercises can be done on a training (exercise) mat, on the floor, on a table or on a bed. Use whatever works the best and is most comfortable for you.    Use music or television while you are exercising so that the exercises are a pleasant break in your day. This will make your life better with the exercises acting as a break in your routine that you can look forward to.   Perform all exercises about fifteen times, three times per day or as directed.   You should exercise both the operative leg and the other leg as well. ° °Exercises include: °  °• Quad Sets - Tighten up the muscle on the front of the thigh (Quad) and hold for 5-10 seconds.   °• Straight Leg Raises - With your knee straight (if you were given a brace, keep it on), lift the leg to 60 degrees, hold for 3 seconds, and slowly lower the leg.  Perform this exercise against resistance later as your leg gets stronger.  °• Leg Slides: Lying on your back, slowly slide your foot toward your buttocks, bending your knee up off the floor (only go as far as is comfortable). Then slowly slide your foot back down until your leg is flat on the floor again.  °• Angel Wings: Lying on your back spread your legs to the side as far apart as you can without causing discomfort.  °• Hamstring Strength:  Lying on your back, push your heel against the floor with your leg straight by tightening up the   muscles of your buttocks.  Repeat, but this time bend your knee to a comfortable angle, and push your heel against the floor.  You Laguna put a pillow under the heel to make it more comfortable if necessary.  ° °A rehabilitation program following joint replacement surgery can speed recovery and prevent re-injury in the future due to weakened muscles. Contact your doctor or a physical therapist for more information on knee rehabilitation.  ° ° °CONSTIPATION ° °Constipation is defined medically as fewer than three stools per week and severe constipation as less than one stool per week.  Even if you have a regular bowel pattern at home, your normal regimen is likely to be disrupted due to multiple reasons following surgery.  Combination of anesthesia, postoperative narcotics, change in appetite and fluid intake all can affect your bowels.  ° °YOU MUST use at least one of the following options; they are listed in order of increasing strength to get the job done.  They are all available over the counter, and you Reif need to use some,  POSSIBLY even all of these options:   ° °Drink plenty of fluids (prune juice [place] be helpful) and high fiber foods °Colace 100 mg by mouth twice a day  °Senokot for constipation as directed and as needed Dulcolax (bisacodyl), take with full glass of water  °Miralax (polyethylene glycol) once or twice a day as needed. ° °If you have tried all these things and are unable to have a bowel movement in the first 3-4 days after surgery call either your surgeon or your primary doctor.   ° °If you experience loose stools or diarrhea, hold the medications until you stool forms back up.  If your symptoms do not get better within 1 week or if they get worse, check with your doctor.  If you experience "the worst abdominal pain ever" or develop nausea or vomiting, please contact the office immediately for further recommendations for treatment. ° ° °ITCHING:  If you experience itching with your medications, try taking only a single pain pill, or even half a pain pill at a time.  You can also use Benadryl over the counter for itching or also to help with sleep.  ° °TED HOSE STOCKINGS:  Use stockings on both legs until for at least 2 weeks or as directed by physician office. They Shaver be removed at night for sleeping. ° °MEDICATIONS:  See your medication summary on the “After Visit Summary” that nursing will review with you.  You Haxton have some home medications which will be placed on hold until you complete the course of blood thinner medication.  It is important for you to complete the blood thinner medication as prescribed. ° °PRECAUTIONS:  If you experience chest pain or shortness of breath - call 911 immediately for transfer to the hospital emergency department.  ° °If you develop a fever greater that 101 F, purulent drainage from wound, increased redness or drainage from wound, foul odor from the wound/dressing, or calf pain - CONTACT YOUR SURGEON.   °                                                °FOLLOW-UP APPOINTMENTS:  If  you do not already have a post-op appointment, please call the office for an appointment to be seen by your surgeon.  Guidelines for how   soon to be seen are listed in your “After Visit Summary”, but are typically between 1-4 weeks after surgery. ° °OTHER INSTRUCTIONS:  ° °Knee Replacement:  Do not place pillow under knee, focus on keeping the knee straight while resting. CPM instructions: 0-90 degrees, 2 hours in the morning, 2 hours in the afternoon, and 2 hours in the evening. Place foam block, curve side up under heel at all times except when in CPM or when walking.  DO NOT modify, tear, cut, or change the foam block in any way. ° °MAKE SURE YOU:  °• Understand these instructions.  °• Get help right away if you are not doing well or get worse.  ° ° °Thank you for letting us be a part of your medical care team.  It is a privilege we respect greatly.  We hope these instructions will help you stay on track for a fast and full recovery!  ° °

## 2015-02-28 NOTE — Anesthesia Preprocedure Evaluation (Addendum)
Anesthesia Evaluation  Patient identified by MRN, date of birth, ID band Patient awake    Reviewed: Allergy & Precautions, NPO status , Patient's Chart, lab work & pertinent test results  History of Anesthesia Complications Negative for: history of anesthetic complications  Airway Mallampati: II  TM Distance: >3 FB Neck ROM: Full    Dental no notable dental hx. (+) Edentulous Upper, Edentulous Lower   Pulmonary sleep apnea ,    Pulmonary exam normal breath sounds clear to auscultation       Cardiovascular hypertension, Pt. on medications Normal cardiovascular exam Rhythm:Regular Rate:Normal     Neuro/Psych  Headaches, PSYCHIATRIC DISORDERS Anxiety    GI/Hepatic negative GI ROS, (+) Cirrhosis       ,   Endo/Other  diabetesMorbid obesity  Renal/GU negative Renal ROS  negative genitourinary   Musculoskeletal  (+) Arthritis ,   Abdominal   Peds negative pediatric ROS (+)  Hematology  (+) Blood dyscrasia, , Thrombocytopenia secondary to splenomegaly and cirrohsis   Anesthesia Other Findings   Reproductive/Obstetrics negative OB ROS                            Anesthesia Physical Anesthesia Plan  ASA: III  Anesthesia Plan: General   Post-op Pain Management: GA combined w/ Regional for post-op pain   Induction: Intravenous  Airway Management Planned: Oral ETT  Additional Equipment:   Intra-op Plan:   Post-operative Plan: Extubation in OR  Informed Consent: I have reviewed the patients History and Physical, chart, labs and discussed the procedure including the risks, benefits and alternatives for the proposed anesthesia with the patient or authorized representative who has indicated his/her understanding and acceptance.   Dental advisory given  Plan Discussed with: CRNA  Anesthesia Plan Comments:         Anesthesia Quick Evaluation

## 2015-02-28 NOTE — H&P (Signed)
TOTAL KNEE ADMISSION H&P  Patient is being admitted for right total knee arthroplasty.  Subjective:  Chief Complaint:right knee pain.  HPI: [name] [name], 65 y.o. female, has a history of pain and functional disability in the right knee due to arthritis and has failed non-surgical conservative treatments for greater than 12 weeks to includeNSAID's and/or analgesics, corticosteriod injections, viscosupplementation injections, flexibility and strengthening excercises, supervised PT with diminished ADL's post treatment, use of assistive devices, weight reduction as appropriate and activity modification.  Onset of symptoms was gradual, starting 5 years ago with gradually worsening course since that time. The patient noted no past surgery on the right knee(s).  Patient currently rates pain in the right knee(s) at 10 out of 10 with activity. Patient has night pain, worsening of pain with activity and weight bearing, pain that interferes with activities of daily living, pain with passive range of motion, crepitus and joint swelling.  Patient has evidence of subchondral cysts, subchondral sclerosis, periarticular osteophytes and joint space narrowing by imaging studies. There is no active infection.  Patient Active Problem List   Diagnosis Date Noted  . Osteoarthritis of right knee 02/28/2015  . Low TSH level 08/02/2014  . Splenomegaly 07/30/2014  . Thrombocytopenia ([place]) 07/30/2014  . 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 Recurrent falls 01/12/2013  . Neuropathy, diabetic ([place]) 01/12/2013  . Morbid obesity ([place]) 01/12/2013  . Degenerative arthritis of spine 01/12/2013  . Knee pain 01/12/2013  . Cough 09/05/2012  . HBP (high blood pressure) 09/05/2012  . Chest wall contusion 12/09/2011  . Abdominal wall contusion 12/09/2011   Past Medical History  Diagnosis Date  . Diabetes mellitus without complication ([place])   . Thyroid disease   . Gout   . Neuropathy ([place])   . Hypertension   . Sleep  apnea   . Cough   . Migraine   . Anxiety   . 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 Arthritis   . Diabetic neuropathy ([place])   . Gout   . Blood dyscrasia     low platelet count- sees [name] , [name] ( Note in EPIC from 07/2014) at [facility]    Past Surgical History  Procedure Laterality Date  . Cholecystectomy    . Tubal ligation    . Joint replacement      left knee    Prescriptions prior to admission  Medication Sig Dispense Refill Last Dose  . allopurinol (ZYLOPRIM) 100 MG tablet Take 100 mg by mouth daily.   02/27/2015 at 1000  . citalopram (CELEXA) 20 MG tablet Take 20 mg by mouth daily.   02/28/2015 at 0445  . furosemide (LASIX) 20 MG tablet Take 20 mg by mouth daily as needed. For fluid/swelling   02/27/2015 at 1000  . gabapentin (NEURONTIN) 600 MG tablet Take 600-1,200 mg by mouth 3 (three) times daily. Take 1 in the morning, 1 in the afternoon and 2 at bedtime.   02/28/2015 at 0445  . levothyroxine (SYNTHROID, LEVOTHROID) 175 MCG tablet Take 1 tablet by mouth daily.  0 02/28/2015 at 0415  . Liraglutide (VICTOZA) 18 MG/3ML SOPN Inject 1.8 mg into the skin daily.   02/27/2015 at 1000  . loratadine (CLARITIN) 10 MG tablet Take 10 mg by mouth daily.   02/27/2015 at 1000  . losartan (COZAAR) 50 MG tablet Take 1 tablet by mouth every morning.    02/27/2015 at 1000  . oxyCODONE (OXY IR/ROXICODONE) 5 MG immediate release tablet Take 5 mg by mouth at bedtime  as needed for severe pain (Feet Pain).      Allergies  Allergen Reactions  . Influenza A (H1n1) Monoval Pf Anaphylaxis  . Codeine Swelling    Social History  Substance Use Topics  . Smoking status: Never Smoker   . Smokeless tobacco: Never Used  . Alcohol Use: No    Family History  Problem Relation Age of Onset  . Bone cancer Paternal Grandmother   . Heart failure Paternal Grandmother   . Cancer Maternal Grandmother   . Heart failure Maternal Grandfather   . Cancer Sister      Review of  Systems  Musculoskeletal: Positive for joint pain.  All other systems reviewed and are negative.   Objective:  Physical Exam  Constitutional: She is oriented to person, place, and time. She appears well-developed and well-nourished.  HENT:  Head: Normocephalic and atraumatic.  Eyes: EOM are normal. Pupils are equal, round, and reactive to light.  Neck: Normal range of motion. Neck supple.  Cardiovascular: Normal rate and regular rhythm.   Respiratory: Effort normal and breath sounds normal.  GI: Soft. Bowel sounds are normal.  Musculoskeletal:       Right knee: She exhibits decreased range of motion, swelling, effusion and abnormal alignment. Tenderness found. Medial joint line and lateral joint line tenderness noted.  Neurological: She is alert and oriented to person, place, and time.  Skin: Skin is warm and dry.  Psychiatric: She has a normal mood and affect.    Vital signs in last 24 hours: Temp:  [98.1 F (36.7 C)] 98.1 F (36.7 C) (01/27 0548) Pulse Rate:  [87] 87 (01/27 0548) Resp:  [18] 18 (01/27 0548) BP: (127)/(81) 127/81 mmHg (01/27 0548) SpO2:  [97 %] 97 % (01/27 0548) Weight:  [117.028 kg (258 lb)] 117.028 kg (258 lb) (01/27 [id number])  Labs:   Estimated body mass index is 42.27 kg/(m^2) as calculated from the following:   Height as of this encounter: 5' 5.5" (1.664 m).   Weight as of this encounter: 117.028 kg (258 lb).   Imaging Review Plain radiographs demonstrate severe degenerative joint disease of the right knee(s). The overall alignment ismild varus. The bone quality appears to be good for age and reported activity level.  Assessment/Plan:  End stage arthritis, right knee   The patient history, physical examination, clinical judgment of the provider and imaging studies are consistent with end stage degenerative joint disease of the right knee(s) and total knee arthroplasty is deemed medically necessary. The treatment options including medical management,  injection therapy arthroscopy and arthroplasty were discussed at length. The risks and benefits of total knee arthroplasty were presented and reviewed. The risks due to aseptic loosening, infection, stiffness, patella tracking problems, thromboembolic complications and other imponderables were discussed. The patient acknowledged the explanation, agreed to proceed with the plan and consent was signed. Patient is being admitted for inpatient treatment for surgery, pain control, PT, OT, prophylactic antibiotics, VTE prophylaxis, progressive ambulation and ADL's and discharge planning. The patient is planning to be discharged home with home health services

## 2015-02-28 NOTE — Op Note (Signed)
[name] [name], [name] [name] NO.:  [id number]  MEDICAL RECORD NO.:  [id number]  LOCATION:  [facility]                         FACILITY:  [facility]  PHYSICIAN:  Lind Guest. Ninfa Linden, M.D.DATE OF BIRTH:  07-12-1950  DATE OF PROCEDURE:  02/28/2015 DATE OF DISCHARGE:                              OPERATIVE REPORT   PREOPERATIVE DIAGNOSES:  Primary osteoarthritis and degenerative joint disease of right knee.  POSTOPERATIVE DIAGNOSES:  Primary osteoarthritis and degenerative joint disease of right knee.  PROCEDURE:  Right total knee arthroplasty.  IMPLANTS:  Stryker Triathlon knee with size 3 femur, size 3 tibial tray, 9-mm fix-bearing polyethylene insert, size 32 patellar button.  SURGEON:  Lind Guest. Ninfa Linden, M.D.  ASSISTANT:  Erskine Emery, PA-C.  ANESTHESIA: 1. Regional adductor canal block, right leg. 2. General.  TOURNIQUET TIME:  1 hour.  BLOOD LOSS:  Less than 200 mL.  ANTIBIOTICS:  2 g of IV Ancef.  COMPLICATIONS:  None.  INDICATIONS:  Ms. Pennywell is a 65 year old female, well known to me.  She had a previous left total knee arthroplasty by me many years ago.  Her right knee, she has debilitating arthritis at this point, there was complete loss of her joint space, periarticular osteophytes all over the femur and the tibia.  Her pain is daily.  Her activities of daily living and mobility as well as quality of life are all detrimentally affected by this knee.  At this point, she wishes to proceed with a total knee arthroplasty.  She has tried and failed all forms of conservative treatment including arthroscopy, steroid injections, supplement injections, rest, ice, heat, time, and activity modification.  Given the failure of all of these conservative efforts, she does wish to proceed with a total knee arthroplasty at this point on the right knee.  She understands the risk of acute blood loss anemia, nerve and vessel injury, fracture, infection, and DVT.  She  understands our goals are decreased pain, improved mobility, and overall improved quality of life.  PROCEDURE DESCRIPTION:  After informed consent was obtained, appropriate right knee was marked.  Anesthesia obtained an adductor canal block. She was then brought to the operating room and placed supine on the operating table.  General anesthesia was then obtained.  A nonsterile tourniquet was placed around her upper right leg, and her right leg was prepped and draped from the thigh down the ankle with DuraPrep and sterile drapes including a sterile stockinette.  With the bed raised, a time-out was called, she was identified as correct patient and correct right knee.  We then used an Esmarch to wrap out the leg and tourniquet was inflated to 300 mm of pressure.  We then took a direct midline incision over the patella and carried this proximally and distally.  We dissected down the knee joint and carried out a medial parapatellar arthrotomy.  We found a large joint effusion, significant periarticular osteophytes throughout the knee.  We found almost essentially no medial and lateral meniscus and the ACL and PCL were pretty much degeneratively gone.  After removing all osteophytes from the knee and almost creating a new notch, we had the knee in a flexed position  and used an extramedullary cutting guide for taking 9 mm off the high side correcting for varus and valgus in the neutral slope.  We made this cut without difficulty and then took 2 more mm based on what we were seen with her anatomy.  We then used an intramedullary drill for the distal femoral cut.  We set this at 5 degrees right externally rotated and for a 10-mm distal femoral cut, made this without difficulty.  We brought the leg back down into full extension and cleaned debris from the back of the knee.  We then placed a 9-mm extension block and we achieved full extension.  We went back to the femur and put our femoral sizing  guide based off the epicondylar axis and chose a size 3 femur.  We then put our 4-in-1 cutting block and made our anterior and posterior cuts followed by our chamfer cuts.  We then made our femoral box cut.  We then went back to the tibia and set our tibial rotation based off the femur and the tibial tubercle.  We chose a size 3 tibia and did our keel cut for this.  With the size 3 tibia and the size 3 femoral component, we placed a 9-mm fixed-bearing trial polyethylene insert and brought the both knee back to full extension.  We were pleased with extension and flexion, gaps and our tightness as well as stability.  We then made our patellar button cut for a size 32 patellar button.  We then removed all trial components.  We did take an x-ray intraoperatively of the knee because of one instrumented break and we did not find any metal debris, this was a Coker and we found the broken part in its entirety, but we still got intraoperative direct Ray-Tec and this was negative.  We then mixed our cement and irrigated the knee with normal saline solution.  We cemented the real Stryker Triathlon tibial tray size 3, followed by the size 3 femur.  We cleaned cement debris from the knee and placed the real 9-mm fix-bearing polyethylene insert.  We cemented our patellar button as well.  Once the cement had hardened, we let the tourniquet down and hemostasis was obtained with electrocautery.  We irrigated the knee again with normal saline solution using pulsatile lavage and then closed the arthrotomy with interrupted #1 Ethibond suture followed by interrupted 0 Vicryl in the deep tissue, 2-0 Vicryl in the subcutaneous tissue, 4-0 Monocryl subcuticular stitch, and Steri-Strips on the skin. Well-padded sterile dressing was applied.  She was awakened, extubated, and taken to the recovery room in stable condition.  All final counts were correct.  There were no complications noted.  Of note, Erskine Emery,  PA-C assisted during the entire case and his assistance was crucial for facilitating all aspects of this case.     Lind Guest. Ninfa Linden, M.D.     CYB/MEDQ  D:  02/28/2015  T:  02/28/2015  Job:  [id number]

## 2015-02-28 NOTE — Transfer of Care (Signed)
Immediate Anesthesia Transfer of Care Note  Patient: [name] [name]  Procedure(s) Performed: Procedure(s): RIGHT TOTAL KNEE ARTHROPLASTY (Right)  Patient Location: PACU  Anesthesia Type:General  Level of Consciousness: awake, alert  and oriented  Airway & Oxygen Therapy: Patient Spontanous Breathing and Patient connected to face mask oxygen  Post-op Assessment: Report given to RN and Post -op Vital signs reviewed and stable  Post vital signs: Reviewed and stable  Last Vitals:  Filed Vitals:   02/28/15 0548 02/28/15 0947  BP: 127/81 176/95  Pulse: 87 109  Temp: 36.7 C   Resp: 18 16    Complications: No apparent anesthesia complications

## 2015-02-28 NOTE — Evaluation (Signed)
Physical Therapy Evaluation Patient Details Name: [name] [name] MRN: [id number] DOB: 30-Aug-1950 Today's Date: 02/28/2015   History of Present Illness  R TKR; pt s/p L TKR and hx of DM and peripheral neuropathy.  Pt states she wears braces on legs to walk but not with her in hospital  Clinical Impression  Pt s/p R TKR presents with decreased R LE strength/ROM and post op pain limiting functional mobility.  Pt should progress to dc home with family assist and HHPT follow up.    Follow Up Recommendations Home health PT    Equipment Recommendations  None recommended by PT    Recommendations for Other Services OT consult     Precautions / Restrictions Precautions Precautions: Fall Required Braces or Orthoses: Knee Immobilizer - Right Knee Immobilizer - Right: Discontinue once straight leg raise with < 10 degree lag Restrictions Weight Bearing Restrictions: No Other Position/Activity Restrictions: WBAT      Mobility  Bed Mobility Overal bed mobility: Needs Assistance Bed Mobility: Supine to Sit     Supine to sit: Mod assist     General bed mobility comments: cues for sequence and use of L LE to self assist.  Physical assist and use of rails to bring trunk to upright and to manage R LE  Transfers Overall transfer level: Needs assistance Equipment used: Rolling walker (2 wheeled) Transfers: Sit to/from Stand Sit to Stand: Min assist;Mod assist;From elevated surface         General transfer comment: cues for LE management and use of UEs to self assist  Ambulation/Gait Ambulation/Gait assistance: Min assist Ambulation Distance (Feet): 30 Feet Assistive device: Rolling walker (2 wheeled) Gait Pattern/deviations: Step-to pattern;Step-through pattern;Shuffle;Trunk flexed     General Gait Details: cues for posture, position from RW and initial sequence  Stairs            Wheelchair Mobility    Modified Rankin (Stroke Patients Only)       Balance                                              Pertinent Vitals/Pain Pain Assessment: 0-10 Pain Score: 4  Pain Location: R knee Pain Descriptors / Indicators: Aching;Sore Pain Intervention(s): Limited activity within patient's tolerance;Monitored during session;Premedicated before session;Ice applied    Home Living Family/patient expects to be discharged to:: Private residence Living Arrangements: Spouse/significant other Available Help at Discharge: Family Type of Home: House Home Access: Stairs to enter   Technical brewer of Steps: 1 Home Layout: One level Home Equipment: Walker - 2 wheels      Prior Function Level of Independence: Independent;Independent with assistive device(s)               Hand Dominance        Extremity/Trunk Assessment   Upper Extremity Assessment: Overall WFL for tasks assessed           Lower Extremity Assessment: RLE deficits/detail         Communication   Communication: No difficulties  Cognition Arousal/Alertness: Awake/alert Behavior During Therapy: WFL for tasks assessed/performed Overall Cognitive Status: Within Functional Limits for tasks assessed                      General Comments      Exercises Total Joint Exercises Ankle Circles/Pumps: AROM;Both;15 reps;Supine      Assessment/Plan  PT Assessment Patient needs continued PT services  PT Diagnosis Difficulty walking   PT Problem List Decreased strength;Decreased range of motion;Decreased activity tolerance;Decreased mobility;Decreased knowledge of use of DME;Pain;Obesity  PT Treatment Interventions DME instruction;Gait training;Stair training;Functional mobility training;Therapeutic activities;Therapeutic exercise;Patient/family education   PT Goals (Current goals can be found in the Care Plan section) Acute Rehab PT Goals Patient Stated Goal: HOME PT Goal Formulation: With patient Time For Goal Achievement: 03/04/15 Potential to  Achieve Goals: Good    Frequency 7X/week   Barriers to discharge        Co-evaluation               End of Session Equipment Utilized During Treatment: Gait belt;Right knee immobilizer Activity Tolerance: Patient tolerated treatment well Patient left: in chair;with call bell/phone within reach;with family/visitor present Nurse Communication: Mobility status         Time: [id number] PT Time Calculation (min) (ACUTE ONLY): 34 min   Charges:   PT Evaluation $PT Eval Low Complexity: 1 Procedure PT Treatments $Gait Training: 8-22 mins   PT G Codes:        [name] [name] 2015/03/01, 4:33 PM

## 2015-02-28 NOTE — Anesthesia Postprocedure Evaluation (Signed)
Anesthesia Post Note  Patient: [name] [name]  Procedure(s) Performed: Procedure(s) (LRB): RIGHT TOTAL KNEE ARTHROPLASTY (Right)  Patient location during evaluation: PACU Anesthesia Type: General Level of consciousness: awake and alert Pain management: pain level controlled Vital Signs Assessment: post-procedure vital signs reviewed and stable Respiratory status: spontaneous breathing, nonlabored ventilation, respiratory function stable and patient connected to nasal cannula oxygen Cardiovascular status: blood pressure returned to baseline and stable Postop Assessment: no signs of nausea or vomiting Anesthetic complications: no    Last Vitals:  Filed Vitals:   02/28/15 1053 02/28/15 1200  BP: 142/83 133/72  Pulse: 101 103  Temp: 37.2 C 36.9 C  Resp: 16 16    Last Pain:  Filed Vitals:   02/28/15 1209  PainSc: [name], Ziyah Cordoba JENNETTE

## 2015-02-28 NOTE — Brief Op Note (Signed)
02/28/2015  9:18 AM  PATIENT:  [name] [name]  64 y.o. female  PRE-OPERATIVE DIAGNOSIS:  Right knee osteoarthritis  POST-OPERATIVE DIAGNOSIS:  Right knee osteoarthritis  PROCEDURE:  Procedure(s): RIGHT TOTAL KNEE ARTHROPLASTY (Right)  SURGEON:  Surgeon(s) and Role:    * [name], MD - Primary  PHYSICIAN ASSISTANT: Benita Stabile, PA-C  ANESTHESIA:   regional and general  EBL:  Total I/O In: 1000 [I.V.:1000] Out: -   COUNTS:  YES  TOURNIQUET:   Total Tourniquet Time Documented: Thigh (Right) - 61 minutes Total: Thigh (Right) - 61 minutes   DICTATION: .Other Dictation: Dictation Number [id number]  PLAN OF CARE: Admit to inpatient   PATIENT DISPOSITION:  PACU - hemodynamically stable.   Delay start of Pharmacological VTE agent (>24hrs) due to surgical blood loss or risk of bleeding: no

## 2015-03-01 LAB — BASIC METABOLIC PANEL
Anion gap: 8 (ref 5–15)
BUN: 18 mg/dL (ref 6–20)
CHLORIDE: 103 mmol/L (ref 101–111)
CO2: 26 mmol/L (ref 22–32)
CREATININE: 0.85 mg/dL (ref 0.44–1.00)
Calcium: 8.7 mg/dL — ABNORMAL LOW (ref 8.9–10.3)
GFR calc non Af Amer: >60 mL/min (ref >60)
Glucose, Bld: 182 mg/dL — ABNORMAL HIGH (ref 65–99)
POTASSIUM: 4.7 mmol/L (ref 3.5–5.1)
SODIUM: 137 mmol/L (ref 135–145)

## 2015-03-01 LAB — CBC
HEMATOCRIT: 32.1 % — AB (ref 36.0–46.0)
HEMOGLOBIN: 10.2 g/dL — AB (ref 12.0–15.0)
MCH: 27.9 pg (ref 26.0–34.0)
MCHC: 31.8 g/dL (ref 30.0–36.0)
MCV: 87.7 fL (ref 78.0–100.0)
Platelets: 101 10*3/uL — ABNORMAL LOW (ref 150–400)
RBC: 3.66 MIL/uL — AB (ref 3.87–5.11)
RDW: 14.3 % (ref 11.5–15.5)
WBC: 8.5 10*3/uL (ref 4.0–10.5)

## 2015-03-01 LAB — GLUCOSE, CAPILLARY
GLUCOSE-CAPILLARY: 162 mg/dL — AB (ref 65–99)
GLUCOSE-CAPILLARY: 158 mg/dL — AB (ref 65–99)
GLUCOSE-CAPILLARY: 129 mg/dL — AB (ref 65–99)
Glucose-Capillary: 124 mg/dL — ABNORMAL HIGH (ref 65–99)

## 2015-03-01 NOTE — Evaluation (Signed)
Occupational Therapy Evaluation Patient Details Name: [name] [name] MRN: [id number] DOB: 12/17/1950 Today's Date: 03/01/2015    History of Present Illness R TKR; pt s/p L TKR and hx of DM and peripheral neuropathy.  Pt states she wears braces on legs to walk but not with her in hospital   Clinical Impression   Pt is s/p TKA resulting in the deficits listed below (see OT Problem List).  Pt will benefit from skilled OT to increase their safety and independence with ADL and functional mobility for ADL to facilitate discharge to venue listed below.       Follow Up Recommendations  No OT follow up    Equipment Recommendations  3 in 1 bedside comode       Precautions / Restrictions Precautions Precautions: Fall Required Braces or Orthoses: Knee Immobilizer - Right Knee Immobilizer - Right: Discontinue once straight leg raise with < 10 degree lag Restrictions Weight Bearing Restrictions: No Other Position/Activity Restrictions: WBAT      Mobility Bed Mobility Overal bed mobility: Needs Assistance Bed Mobility: Supine to Sit;Sit to Supine     Supine to sit: Min assist Sit to supine: Min assist   General bed mobility comments: cues for sequence and use of L LE to self assist  Transfers Overall transfer level: Needs assistance Equipment used: Rolling walker (2 wheeled) Transfers: Sit to/from Stand Sit to Stand: Min assist         General transfer comment: VC for Ue placement         ADL Overall ADL's : Needs assistance/impaired                     Lower Body Dressing: Moderate assistance;Sit to/from stand;Cueing for sequencing;Cueing for safety   Toilet Transfer: Moderate assistance;BSC;RW   Toileting- Clothing Manipulation and Hygiene: Independent;Sit to/from stand;Cueing for sequencing;Cueing for safety         General ADL Comments: pt reports husband will A her but is interested in AE.  Pt states she only uses walk in shower and is not interested in  getting in the tub               Pertinent Vitals/Pain Pain Assessment: 0-10 Pain Score: 5  Pain Location: r knee Pain Descriptors / Indicators: Sore Pain Intervention(s): Monitored during session;Repositioned     Hand Dominance     Extremity/Trunk Assessment Upper Extremity Assessment Upper Extremity Assessment: Overall WFL for tasks assessed           Communication Communication Communication: No difficulties   Cognition Arousal/Alertness: Awake/alert Behavior During Therapy: WFL for tasks assessed/performed Overall Cognitive Status: Within Functional Limits for tasks assessed                                Home Living Family/patient expects to be discharged to:: Private residence Living Arrangements: Spouse/significant other Available Help at Discharge: Family Type of Home: House Home Access: Stairs to enter Technical brewer of Steps: 1   Home Layout: One level     Bathroom Shower/Tub: Occupational psychologist: Standard     Home Equipment: Environmental consultant - 2 wheels          Prior Functioning/Environment Level of Independence: Independent;Independent with assistive device(s)             OT Diagnosis: Generalized weakness   OT Problem List: Decreased strength;Pain   OT Treatment/Interventions: Self-care/ADL training;DME and/or AE instruction;Patient/family  education    OT Goals(Current goals can be found in the care plan section) Acute Rehab OT Goals Patient Stated Goal: HOME OT Goal Formulation: With patient Time For Goal Achievement: 2015-03-26 Potential to Achieve Goals: Good  OT Frequency: Min 2X/week              End of Session  Activity Tolerance: Patient limited by fatigue Patient left: in bed;with call bell/phone within reach;with family/visitor present     Charges:  OT General Charges $OT Visit: 1 Procedure OT Evaluation $OT Eval Low Complexity: 1 Procedure G-Codes:    Betsy Pries 03-26-15,  3:29 PM

## 2015-03-01 NOTE — Progress Notes (Signed)
OT Cancellation Note  Patient Details Name: [name] [name] MRN: [id number] DOB: 09-Aug-1950   Cancelled Treatment:    Reason Eval/Treat Not Completed: Fatigue/lethargy limiting ability to participate  Betsy Pries 03/01/2015, 12:20 PM

## 2015-03-01 NOTE — Progress Notes (Signed)
Physical Therapy Treatment Patient Details Name: [name] [name] MRN: [id number] DOB: 1950-12-02 Today's Date: 03/01/2015    History of Present Illness R TKR; pt s/p L TKR and hx of DM and peripheral neuropathy.  Pt states she wears braces on legs to walk but not with her in hospital    PT Comments    Steady progress with mobility.  Pt hopeful for dc tomorrow.  Follow Up Recommendations  Home health PT     Equipment Recommendations  None recommended by PT    Recommendations for Other Services OT consult     Precautions / Restrictions Precautions Precautions: Fall Required Braces or Orthoses: Knee Immobilizer - Right Knee Immobilizer - Right: Discontinue once straight leg raise with < 10 degree lag Restrictions Weight Bearing Restrictions: No Other Position/Activity Restrictions: WBAT    Mobility  Bed Mobility Overal bed mobility: Needs Assistance Bed Mobility: Supine to Sit;Sit to Supine     Supine to sit: Min assist Sit to supine: Min assist   General bed mobility comments: cues for sequence and use of L LE to self assist  Transfers Overall transfer level: Needs assistance Equipment used: Rolling walker (2 wheeled) Transfers: Sit to/from Stand Sit to Stand: Min assist         General transfer comment: cues for LE management and use of UEs to self assist  Ambulation/Gait Ambulation/Gait assistance: Min assist Ambulation Distance (Feet): 80 Feet Assistive device: Rolling walker (2 wheeled) Gait Pattern/deviations: Decreased step length - right;Decreased step length - left;Step-to pattern;Step-through pattern;Shuffle;Trunk flexed Gait velocity: decr   General Gait Details: cues for posture, position from RW and initial sequence   Stairs            Wheelchair Mobility    Modified Rankin (Stroke Patients Only)       Balance                                    Cognition Arousal/Alertness: Awake/alert Behavior During Therapy: WFL  for tasks assessed/performed Overall Cognitive Status: Within Functional Limits for tasks assessed                      Exercises      General Comments        Pertinent Vitals/Pain Pain Assessment: 0-10 Pain Score: 5  Pain Location: r knee Pain Descriptors / Indicators: Sore Pain Intervention(s): Monitored during session;Repositioned    Home Living Family/patient expects to be discharged to:: Private residence Living Arrangements: Spouse/significant other Available Help at Discharge: Family Type of Home: House Home Access: Stairs to enter   Home Layout: One level Home Equipment: Environmental consultant - 2 wheels      Prior Function Level of Independence: Independent;Independent with assistive device(s)          PT Goals (current goals can now be found in the care plan section) Acute Rehab PT Goals Patient Stated Goal: HOME PT Goal Formulation: With patient Time For Goal Achievement: 03/04/15 Potential to Achieve Goals: Good Progress towards PT goals: Progressing toward goals    Frequency  7X/week    PT Plan Current plan remains appropriate    Co-evaluation             End of Session Equipment Utilized During Treatment: Gait belt Activity Tolerance: Patient tolerated treatment well Patient left: in bed;with call bell/phone within reach;with family/visitor present     Time: [id number] PT Time Calculation (  min) (ACUTE ONLY): 32 min  Charges:  $Gait Training: 23-37 mins                    G Codes:      [name] [name] 2015-03-23, 3:04 PM

## 2015-03-01 NOTE — Progress Notes (Signed)
Subjective: 1 Day Post-Op Procedure(s) (LRB): RIGHT TOTAL KNEE ARTHROPLASTY (Right) Patient reports pain as 6 on 0-10 scale.    Objective: Vital signs in last 24 hours: Temp:  [97.9 F (36.6 C)-98.6 F (37 C)] 98.6 F (37 C) (01/28 0900) Pulse Rate:  [72-102] 72 (01/28 0900) Resp:  [15-18] 15 (01/28 0900) BP: (106-134)/(59-76) 114/59 mmHg (01/28 1000) SpO2:  [94 %-97 %] 96 % (01/28 0900)  Intake/Output from previous day: 01/27 0701 - 01/28 0700 In: 3887.5 [P.O.:1200; I.V.:2687.5] Out: 1750 [Urine:1750] Intake/Output this shift: Total I/O In: 240 [P.O.:240] Out: 300 [Urine:300]   Recent Labs  02/28/15 0655 03/01/15 0432  HGB 11.9* 10.2*    Recent Labs  02/28/15 0655 03/01/15 0432  WBC 5.6 8.5  RBC 4.28 3.66*  HCT 37.1 32.1*  PLT 105* 101*    Recent Labs  03/01/15 0432  NA 137  K 4.7  CL 103  CO2 26  BUN 18  CREATININE 0.85  GLUCOSE 182*  CALCIUM 8.7*    Recent Labs  02/28/15 0655  INR 1.35    dressing  tiny spot otherwise dry  Assessment/Plan: 1 Day Post-Op Procedure(s) (LRB): RIGHT TOTAL KNEE ARTHROPLASTY (Right) Up with therapy  [name] [name] C 03/01/2015, 12:05 PM

## 2015-03-01 NOTE — Progress Notes (Signed)
Physical Therapy Treatment Patient Details Name: [name] [name] MRN: [id number] DOB: 04-06-1950 Today's Date: 03/01/2015    History of Present Illness R TKR; pt s/p L TKR and hx of DM and peripheral neuropathy.  Pt states she wears braces on legs to walk but not with her in hospital    PT Comments    Pt motivated, progressing well and hopeful for dc home tomorrow.  Follow Up Recommendations  Home health PT     Equipment Recommendations  None recommended by PT    Recommendations for Other Services OT consult     Precautions / Restrictions Precautions Precautions: Fall Required Braces or Orthoses: Knee Immobilizer - Right Knee Immobilizer - Right: Discontinue once straight leg raise with < 10 degree lag (Pt performed IND SLR this am) Restrictions Weight Bearing Restrictions: No Other Position/Activity Restrictions: WBAT    Mobility  Bed Mobility               General bed mobility comments: Pt OOB with nursing and declines back to bed  Transfers Overall transfer level: Needs assistance Equipment used: Rolling walker (2 wheeled) Transfers: Sit to/from Stand Sit to Stand: Min assist         General transfer comment: cues for LE management and use of UEs to self assist  Ambulation/Gait Ambulation/Gait assistance: Min assist Ambulation Distance (Feet): 74 Feet Assistive device: Rolling walker (2 wheeled) Gait Pattern/deviations: Step-to pattern;Step-through pattern;Decreased step length - right;Decreased step length - left;Shuffle;Trunk flexed Gait velocity: decr   General Gait Details: cues for posture, position from RW and initial sequence   Stairs            Wheelchair Mobility    Modified Rankin (Stroke Patients Only)       Balance                                    Cognition Arousal/Alertness: Awake/alert Behavior During Therapy: WFL for tasks assessed/performed Overall Cognitive Status: Within Functional Limits for tasks  assessed                      Exercises Total Joint Exercises Ankle Circles/Pumps: AROM;Both;15 reps;Supine Quad Sets: AROM;Both;10 reps;Supine Heel Slides: AAROM;Right;15 reps;Supine Straight Leg Raises: AAROM;AROM;Right;15 reps;Supine Goniometric ROM: AAROM at R knee -10- 40    General Comments        Pertinent Vitals/Pain Pain Assessment: 0-10 Pain Score: 4  Pain Location: R knee Pain Descriptors / Indicators: Aching;Sore Pain Intervention(s): Limited activity within patient's tolerance;Monitored during session;Premedicated before session;Ice applied    Home Living                      Prior Function            PT Goals (current goals can now be found in the care plan section) Acute Rehab PT Goals Patient Stated Goal: HOME PT Goal Formulation: With patient Time For Goal Achievement: 03/04/15 Potential to Achieve Goals: Good Progress towards PT goals: Progressing toward goals    Frequency  7X/week    PT Plan Current plan remains appropriate    Co-evaluation             End of Session Equipment Utilized During Treatment: Gait belt Activity Tolerance: Patient tolerated treatment well Patient left: in chair;with call bell/phone within reach;with family/visitor present     Time: 0823-0850 PT Time Calculation (min) (ACUTE ONLY): 27  min  Charges:  $Gait Training: 8-22 mins $Therapeutic Exercise: 8-22 mins                    G Codes:      Malik Paar 03-24-2015, 11:30 AM

## 2015-03-01 NOTE — Care Management Note (Signed)
Case Management Note  Patient Details  Name: [name] [name] MRN: [id number] Date of Birth: Jul 07, 1950  Subjective/Objective:     s/p L TKR                Action/Plan: NCM spoke to pt. States she has RW at home. Requesting 3n1 for home. Contacted [facility] DME rep for 3n1 for home. Offered choice for pt for [facility]. Pt agreeable to [facility] for [facility].     Expected Discharge Date:  03/03/2015            Expected Discharge Plan:  [place]  In-House Referral:  NA  Discharge planning Services  CM Consult  Post Acute Care Choice:  Home Health Choice offered to:  Patient  DME Arranged:  3-N-1 DME Agency:  [place]:  PT [facility] Agency:  [place]  Status of Service:  Completed, signed off  Medicare Important Message Given:    Date Medicare IM Given:    Medicare IM give by:    Date Additional Medicare IM Given:    Additional Medicare Important Message give by:     If discussed at [place] of Stay Meetings, dates discussed:    Additional Comments:  Erenest Rasher, RN 03/01/2015, 5:06 PM

## 2015-03-02 LAB — HEMOGLOBIN AND HEMATOCRIT, BLOOD
HCT: 29.8 % — ABNORMAL LOW (ref 36.0–46.0)
HEMOGLOBIN: 9.5 g/dL — AB (ref 12.0–15.0)

## 2015-03-02 LAB — GLUCOSE, CAPILLARY
GLUCOSE-CAPILLARY: 130 mg/dL — AB (ref 65–99)
GLUCOSE-CAPILLARY: 149 mg/dL — AB (ref 65–99)
GLUCOSE-CAPILLARY: 126 mg/dL — AB (ref 65–99)

## 2015-03-02 MED ORDER — RIVAROXABAN 10 MG PO TABS: 10.0000 mg | ORAL_TABLET | Freq: Every day | ORAL | Status: DC

## 2015-03-02 MED ORDER — METHOCARBAMOL 500 MG PO TABS: 500.0000 mg | ORAL_TABLET | Freq: Four times a day (QID) | ORAL | Status: DC | PRN

## 2015-03-02 MED ORDER — SODIUM CHLORIDE 0.9 % IV BOLUS (SEPSIS)
400.0000 mL | Freq: Once | INTRAVENOUS | Status: AC
  Administered 2015-03-02: 400 mL via INTRAVENOUS

## 2015-03-02 MED ORDER — OXYCODONE-ACETAMINOPHEN 10-325 MG PO TABS: 1.0000 | ORAL_TABLET | ORAL | Status: DC | PRN

## 2015-03-02 NOTE — Progress Notes (Signed)
Dr Lorin Mercy notified re previous note. Orders received. Winston Misner, [employer]

## 2015-03-02 NOTE — Progress Notes (Signed)
Physical Therapy Treatment Patient Details Name: [name] [name] MRN: [id number] DOB: 04-15-1950 Today's Date: 03/02/2015    History of Present Illness R TKR; pt s/p L TKR and hx of DM and peripheral neuropathy.  Pt states she wears braces on legs to walk but not with her in hospital    PT Comments    Pt cooperative but c/o dizziness and fatigue with attempt to ambulate - BP in standing 155/61 with HR 133 and SaO2 @ 69% on RA.  Pt transferred to chair, RN alerted and O2 applied at 3L with SaO2 to 85% and to 91% with increase to 4L.   Pt transferred to bed with increasing difficulty processing cues.  RN fully informed.  Follow Up Recommendations  Home health PT     Equipment Recommendations  None recommended by PT    Recommendations for Other Services OT consult     Precautions / Restrictions Precautions Precautions: Fall Required Braces or Orthoses: Knee Immobilizer - Right Knee Immobilizer - Right: Discontinue once straight leg raise with < 10 degree lag Restrictions Weight Bearing Restrictions: No Other Position/Activity Restrictions: WBAT    Mobility  Bed Mobility Overal bed mobility: +2 for physical assistance;+ 2 for safety/equipment;Needs Assistance Bed Mobility: Sit to Supine     Supine to sit: Min assist Sit to supine: Min assist;Mod assist;+2 for physical assistance;+2 for safety/equipment   General bed mobility comments: Increasing lethargy and difficulty coordinating activities - required increased assist to complete tasks  Transfers Overall transfer level: Needs assistance Equipment used: Rolling walker (2 wheeled) Transfers: Sit to/from Stand Sit to Stand: Min assist;Mod assist;+2 physical assistance;+2 safety/equipment Stand pivot transfers: Min assist;Mod assist;+2 physical assistance;+2 safety/equipment       General transfer comment: cues for LE management, use of UEs to WB, and sequencing.  Ambulation/Gait Ambulation/Gait assistance: Min assist;Mod  assist;+2 safety/equipment Ambulation Distance (Feet): 6 Feet Assistive device: Rolling walker (2 wheeled) Gait Pattern/deviations: Step-to pattern;Decreased step length - right;Decreased step length - left;Shuffle;Trunk flexed Gait velocity: decr   General Gait Details: cues for posture and position from RW.  Pt with increasing difficulty followign cues.     Stairs            Wheelchair Mobility    Modified Rankin (Stroke Patients Only)       Balance                                    Cognition Arousal/Alertness: Awake/alert;Lethargic Behavior During Therapy: WFL for tasks assessed/performed Overall Cognitive Status: Impaired/Different from baseline Area of Impairment: Following commands;Problem solving       Following Commands: Follows one step commands inconsistently     Problem Solving: Slow processing;Requires verbal cues;Requires tactile cues General Comments: Pt with increasing difficulty processing adn following commands.  Pulled bed in behind pt with pt unable to follow cues to step bkwd to bed    Exercises Total Joint Exercises Ankle Circles/Pumps: AROM;Both;15 reps;Supine Quad Sets: AROM;Both;Supine;15 reps Heel Slides: AAROM;Right;Supine;20 reps Straight Leg Raises: AAROM;AROM;Right;Supine;20 reps Goniometric ROM: AAROM at R knee -10- 40    General Comments        Pertinent Vitals/Pain Pain Assessment: 0-10 Pain Score: 4  Pain Location: R knee Pain Descriptors / Indicators: Aching;Sore Pain Intervention(s): Limited activity within patient's tolerance;Monitored during session;Ice applied    Home Living  Prior Function            PT Goals (current goals can now be found in the care plan section) Acute Rehab PT Goals Patient Stated Goal: HOME PT Goal Formulation: With patient Time For Goal Achievement: 03/04/15 Potential to Achieve Goals: Good Progress towards PT goals: Not progressing toward  goals - comment (Change in cognitive status with SaO2 On RA @ 69%)    Frequency  7X/week    PT Plan Current plan remains appropriate    Co-evaluation             End of Session Equipment Utilized During Treatment: Gait belt;Right knee immobilizer Activity Tolerance: Patient limited by lethargy;Patient limited by fatigue Patient left: in bed;with call bell/phone within reach     Time: 1213-1250 PT Time Calculation (min) (ACUTE ONLY): 37 min  Charges:  $Gait Training: 8-22 mins $Therapeutic Exercise: 8-22 mins $Therapeutic Activity: 8-22 mins                    G Codes:      [name] [name] 25-Mar-2015, 1:09 PM

## 2015-03-02 NOTE — Progress Notes (Signed)
Dr Lorin Mercy on unit, updated re Ms Streety. Kashina Mecum, [employer]

## 2015-03-02 NOTE — Progress Notes (Signed)
Subjective: 2 Days Post-Op Procedure(s) (LRB): RIGHT TOTAL KNEE ARTHROPLASTY (Right) Patient reports pain as mild.  Walked in halls , doing steps today before discharge.   Objective: Vital signs in last 24 hours: Temp:  [98.6 F (37 C)-99.3 F (37.4 C)] 99.3 F (37.4 C) (01/29 0514) Pulse Rate:  [72-93] 92 (01/29 0514) Resp:  [15-18] 18 (01/29 0514) BP: (106-130)/(55-64) 130/55 mmHg (01/29 0514) SpO2:  [92 %-97 %] 97 % (01/29 0514)  Intake/Output from previous day: 01/28 0701 - 01/29 0700 In: 2895 [P.O.:1520; I.V.:1375] Out: 900 [Urine:900] Intake/Output this shift:     Recent Labs  02/28/15 0655 03/01/15 0432  HGB 11.9* 10.2*    Recent Labs  02/28/15 0655 03/01/15 0432  WBC 5.6 8.5  RBC 4.28 3.66*  HCT 37.1 32.1*  PLT 105* 101*    Recent Labs  03/01/15 0432  NA 137  K 4.7  CL 103  CO2 26  BUN 18  CREATININE 0.85  GLUCOSE 182*  CALCIUM 8.7*    Recent Labs  02/28/15 0655  INR 1.35    Neurologically intact  Assessment/Plan: 2 Days Post-Op Procedure(s) (LRB): RIGHT TOTAL KNEE ARTHROPLASTY (Right) Discharge home with home health after therapy today.   Franke Menter C 03/02/2015, 8:29 AM

## 2015-03-02 NOTE — Progress Notes (Signed)
Physical Therapy Treatment Patient Details Name: [name] [name] MRN: [id number] DOB: 08/27/1950 Today's Date: 03/02/2015    History of Present Illness R TKR; pt s/p L TKR and hx of DM and peripheral neuropathy.  Pt states she wears braces on legs to walk but not with her in hospital    PT Comments    Pt cooperative but ltd this am by c/o dizziness with OOB activity - BP 114/60 with HR 118 - RN aware  Follow Up Recommendations  Home health PT     Equipment Recommendations  None recommended by PT    Recommendations for Other Services OT consult     Precautions / Restrictions Precautions Precautions: Fall Required Braces or Orthoses: Knee Immobilizer - Right Knee Immobilizer - Right: Discontinue once straight leg raise with < 10 degree lag Restrictions Weight Bearing Restrictions: No Other Position/Activity Restrictions: WBAT    Mobility  Bed Mobility Overal bed mobility: Needs Assistance Bed Mobility: Supine to Sit     Supine to sit: Min assist     General bed mobility comments: cues for sequence and use of L LE to self assist; Pt states husband has to assist her OOB at home prior to surgery  Transfers Overall transfer level: Needs assistance Equipment used: Rolling walker (2 wheeled) Transfers: Sit to/from Stand Sit to Stand: Min assist         General transfer comment: min cues for UE placement.  Assist to steady 2* c/o dizziness  Ambulation/Gait Ambulation/Gait assistance: Min assist Ambulation Distance (Feet): 6 Feet Assistive device: Rolling walker (2 wheeled) Gait Pattern/deviations: Step-to pattern;Decreased step length - right;Decreased step length - left;Shuffle;Trunk flexed Gait velocity: decr   General Gait Details: cues for posture and position from RW.  LTd by pt c/o dizziness   Stairs            Wheelchair Mobility    Modified Rankin (Stroke Patients Only)       Balance                                     Cognition Arousal/Alertness: Awake/alert Behavior During Therapy: WFL for tasks assessed/performed Overall Cognitive Status: Within Functional Limits for tasks assessed                      Exercises Total Joint Exercises Ankle Circles/Pumps: AROM;Both;15 reps;Supine Quad Sets: AROM;Both;Supine;15 reps Heel Slides: AAROM;Right;Supine;20 reps Straight Leg Raises: AAROM;AROM;Right;Supine;20 reps Goniometric ROM: AAROM at R knee -10- 40    General Comments        Pertinent Vitals/Pain Pain Assessment: 0-10 Pain Score: 4  Pain Location: R knee Pain Descriptors / Indicators: Aching;Sore Pain Intervention(s): Limited activity within patient's tolerance;Monitored during session;Premedicated before session;Ice applied    Home Living                      Prior Function            PT Goals (current goals can now be found in the care plan section) Acute Rehab PT Goals Patient Stated Goal: HOME PT Goal Formulation: With patient Time For Goal Achievement: 03/04/15 Potential to Achieve Goals: Good Progress towards PT goals: Progressing toward goals    Frequency  7X/week    PT Plan Current plan remains appropriate    Co-evaluation             End of Session Equipment Utilized  During Treatment: Gait belt Activity Tolerance: Other (comment) (C/o Dizziness with OOB activity - BP 114/60 with HR 118) Patient left: in chair;with call bell/phone within reach;with family/visitor present     Time: [id number] PT Time Calculation (min) (ACUTE ONLY): 28 min  Charges:  $Gait Training: 8-22 mins $Therapeutic Exercise: 8-22 mins                    G Codes:      Masaji Billups 2015/03/07, 11:27 AM

## 2015-03-02 NOTE — Progress Notes (Signed)
Utilization review completed.  

## 2015-03-02 NOTE — Progress Notes (Signed)
Occupational Therapy Treatment Patient Details Name: [name] [name] MRN: [id number] DOB: 1950-04-25 Today's Date: 03/02/2015    History of present illness R TKR; pt s/p L TKR and hx of DM and peripheral neuropathy.  Pt states she wears braces on legs to walk but not with her in hospital   OT comments  Pt wants to DC this day. Pt and husband feel comfortable with ADL activity at home- bathing and dressing and toilet ing.    Follow Up Recommendations  No OT follow up    Equipment Recommendations  3 in 1 bedside comode    Recommendations for Other Services      Precautions / Restrictions Precautions Precautions: Fall Required Braces or Orthoses: Knee Immobilizer - Right Knee Immobilizer - Right: Discontinue once straight leg raise with < 10 degree lag Restrictions Weight Bearing Restrictions: No Other Position/Activity Restrictions: WBAT       Mobility Bed Mobility Overal bed mobility: Needs Assistance Bed Mobility: Supine to Sit     Supine to sit: Min assist     General bed mobility comments: pt in chair  Transfers Overall transfer level: Needs assistance Equipment used: Rolling walker (2 wheeled) Transfers: Sit to/from [employer] Sit to Stand: Min assist Stand pivot transfers: Min assist       General transfer comment: min cues for UE placement.  Assist to steady 2* c/o dizziness        ADL               Lower Body Bathing: Minimal assistance;Sit to/from stand;Cueing for safety;Cueing for sequencing;With adaptive equipment   Upper Body Dressing : Set up;Sitting   Lower Body Dressing: Sit to/from stand;Cueing for sequencing;Cueing for safety;With adaptive equipment;Minimal assistance   Toilet Transfer: Min guard;RW;BSC   Toileting- Clothing Manipulation and Hygiene: Sit to/from stand;Cueing for sequencing;Cueing for safety;Minimal assistance Toileting - Clothing Manipulation Details (indicate cue type and reason): pt needed A with  pants as they kept falling to floor.     Tub/Shower Transfer Details (indicate cue type and reason): plans to practice with [facility] therapist                    Cognition   Behavior During Therapy: [facility] for tasks assessed/performed Overall Cognitive Status: Within Functional Limits for tasks assessed                         Exercises Total Joint Exercises Ankle Circles/Pumps: AROM;Both;15 reps;Supine Quad Sets: AROM;Both;Supine;15 reps Heel Slides: AAROM;Right;Supine;20 reps Straight Leg Raises: AAROM;AROM;Right;Supine;20 reps Goniometric ROM: AAROM at R knee -10- 40   Shoulder Instructions       General Comments      Pertinent Vitals/ Pain       Pain Assessment: 0-10 Pain Score: 4  Pain Location: R knee Pain Descriptors / Indicators: Sore Pain Intervention(s): Limited activity within patient's tolerance         Frequency Min 2X/week     Progress Toward Goals  OT Goals(current goals can now be found in the care plan section)  Progress towards OT goals: Progressing toward goals  Acute Rehab OT Goals Patient Stated Goal: HOME  Plan Discharge plan remains appropriate       End of Session Equipment Utilized During Treatment: Rolling walker CPM Right Knee CPM Right Knee: Off   Activity Tolerance Patient tolerated treatment well   Patient Left with call bell/phone within reach;with family/visitor present;in chair   Nurse Communication Mobility status  Time: [id number] OT Time Calculation (min): 17 min  Charges: OT General Charges $OT Visit: 1 Procedure OT Treatments $Self Care/Home Management : 8-22 mins  Pollie Poma, Thereasa Parkin 03/02/2015, 11:29 AM

## 2015-03-02 NOTE — Progress Notes (Addendum)
Pt's temp = 102.7 @ 1303; tylenol given. Temp = 100.3 @ 1354. Lungs clear to ascultation, diminished in bases. Dr Lorin Mercy is in surgery & I will let him know afterward. Reilly Molchan, [employer]

## 2015-03-02 NOTE — Progress Notes (Addendum)
PT reports [name] [name] having trouble processing their instructions earlier and now while getting her back to bed. O2 sat 70% & pt put back on oxygen @ 2 lpm via cpap, increased to 3 lpm when sat only came up to 85%. Now 93%. Have notified Dr Lorin Mercy re pt having light-headedness when up with PT this am. Will notify him about this. [name] [name], [employer]

## 2015-03-02 NOTE — Progress Notes (Signed)
EKG done and results given to Dr Lorin Mercy via phone. Info acknowledged. Fernado Brigante, [employer]

## 2015-03-03 LAB — GLUCOSE, CAPILLARY: GLUCOSE-CAPILLARY: 156 mg/dL — AB (ref 65–99)

## 2015-03-03 NOTE — Progress Notes (Signed)
PHYSICAL THERAPY  [name] [name], [name]   PT Comments    POD # 3 pt eager to D/C to home. Performed TKR TE's following HEP handout in bed. Instructed on proper tech and freq. Assisted OOB to amb a short distance then assisted to [facility] where NT entered room to gave a bath. Pt only has a small threshold step to enter home and verbally stated sequence correctly.   Follow Up Recommendations  Home health PT    Equipment Recommendations  None recommended by PT    Recommendations for Other Services       Precautions / Restrictions Precautions Precautions: Knee;Fall Restrictions Weight Bearing Restrictions: No Other Position/Activity Restrictions: WBAT    Mobility  Bed Mobility Overal bed mobility: Needs Assistance Bed Mobility: Supine to Sit     Supine to sit: Min guard     General bed mobility comments: increased time  Transfers Overall transfer level: Needs assistance Equipment used: Rolling walker (2 wheeled) Transfers: Sit to/from Stand Sit to Stand: Supervision         General transfer comment: good use of hands for safety  Ambulation/Gait Ambulation/Gait assistance: Supervision Ambulation Distance (Feet): 12 Feet Assistive device: Rolling walker (2 wheeled) Gait Pattern/deviations: Step-to pattern Gait velocity: decreased   General Gait Details: min cues for posture, sequence, and position from [name] [name] [name]            Wheelchair Mobility    Modified Rankin (Stroke Patients Only)       Balance                                    Cognition Arousal/Alertness: Awake/alert                          Exercises      General Comments        Pertinent Vitals/Pain Pain Assessment: 0-10 Pain Score: 8  Pain Location: R knee Pain Descriptors / Indicators: Aching;Sore Pain Intervention(s): Monitored during session;Premedicated before session;Repositioned    Home Living                     Prior Function            PT Goals (current goals can now be found in the care plan section) Progress towards PT goals: Progressing toward goals    Frequency  7X/week    PT Plan Current plan remains appropriate    Co-evaluation             End of Session Equipment Utilized During Treatment: Gait belt Activity Tolerance: Patient tolerated treatment well Patient left: in chair;with call bell/phone within reach     Time: 1115-1140 PT Time Calculation (min) (ACUTE ONLY): 25 min  Charges: $Gait Training: 8-22 mins $Therapeutic Exercise: 8-22 mins    G Codes:     [name] [name] [name] PTA [facility] Acute Rehab Pager [phone]

## 2015-03-03 NOTE — Care Management Important Message (Signed)
Important Message  Patient Details  Name: Yolander Yandell Haser MRN: [id number] Date of Birth: 04-05-1950   Medicare Important Message Given:  Yes    Camillo Flaming 03/03/2015, 10:51 AMImportant Message  Patient Details  Name: Troi Ririe Recchia MRN: [id number] Date of Birth: Jul 14, 1950   Medicare Important Message Given:  Yes    Camillo Flaming 03/03/2015, 10:51 AM

## 2015-03-03 NOTE — Progress Notes (Signed)
Subjective: 3 Days Post-Op Procedure(s) (LRB): RIGHT TOTAL KNEE ARTHROPLASTY (Right) Patient reports pain as moderate.  Feels better overall.  Asymptomatic acute blood loss anemia and stable vitals now.  Denies dizziness this am.  Objective: Vital signs in last 24 hours: Temp:  [98.8 F (37.1 C)-102.7 F (39.3 C)] 99.3 F (37.4 C) (01/30 0450) Pulse Rate:  [91-133] 91 (01/30 0450) Resp:  [16-19] 17 (01/30 0450) BP: (98-155)/(41-72) 118/72 mmHg (01/30 0500) SpO2:  [91 %-98 %] 98 % (01/30 0450)  Intake/Output from previous day: 01/29 0701 - 01/30 0700 In: 720 [P.O.:720] Out: -  Intake/Output this shift:     Recent Labs  03/01/15 0432 03/02/15 1337  HGB 10.2* 9.5*    Recent Labs  03/01/15 0432 03/02/15 1337  WBC 8.5  --   RBC 3.66*  --   HCT 32.1* 29.8*  PLT 101*  --     Recent Labs  03/01/15 0432  NA 137  K 4.7  CL 103  CO2 26  BUN 18  CREATININE 0.85  GLUCOSE 182*  CALCIUM 8.7*   No results for input(s): LABPT, INR in the last 72 hours.  Sensation intact distally Intact pulses distally Dorsiflexion/Plantar flexion intact Incision: scant drainage Compartment soft  Assessment/Plan: 3 Days Post-Op Procedure(s) (LRB): RIGHT TOTAL KNEE ARTHROPLASTY (Right) Up with therapy Discharge home with home health today.  Dakwon Wenberg Y 03/03/2015, 7:28 AM

## 2015-03-03 NOTE — Discharge Summary (Signed)
Patient ID: [name] [name] MRN: [id number] DOB/AGE: 1950-12-27 65 y.o.  Admit date: 02/28/2015 Discharge date: 03/03/2015  Admission Diagnoses:  Principal Problem:   Osteoarthritis of right knee Active Problems:   Status post total right knee replacement   Discharge Diagnoses:  Same  Past Medical History  Diagnosis Date  . Diabetes mellitus without complication ([place])   . Thyroid disease   . Gout   . Neuropathy ([place])   . Hypertension   . Sleep apnea   . Cough   . Migraine   . Anxiety   . 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 Arthritis   . Diabetic neuropathy ([place])   . Gout   . Blood dyscrasia     low platelet count- sees [name] , [name] ( Note in [place] from 07/2014) at [facility]: Procedure(s): RIGHT TOTAL KNEE ARTHROPLASTY on 02/28/2015   Consultants:    Discharged Condition: Improved  Hospital Course: [name] [name] is an 65 y.o. female who was admitted 02/28/2015 for operative treatment ofOsteoarthritis of right knee. Patient has severe unremitting pain that affects sleep, daily activities, and work/hobbies. After pre-op clearance the patient was taken to the operating room on 02/28/2015 and underwent  Procedure(s): RIGHT TOTAL KNEE ARTHROPLASTY.    Patient was given perioperative antibiotics: Anti-infectives    Start     Dose/Rate Route Frequency Ordered Stop   02/28/15 1400  ceFAZolin (ANCEF) IVPB 1 g/50 mL premix     1 g 100 mL/hr over 30 Minutes Intravenous Every 6 hours 02/28/15 1103 02/28/15 2025   02/28/15 0613  ceFAZolin (ANCEF) IVPB 2 g/50 mL premix     2 g 100 mL/hr over 30 Minutes Intravenous On call to O.R. 02/28/15 [id number] 02/28/15 0747       Patient was given sequential compression devices, early ambulation, and chemoprophylaxis to prevent DVT.  Patient benefited maximally from hospital stay and there were no complications.    Recent vital signs: Patient Vitals for the past 24 hrs:  BP Temp Temp src Pulse  Resp SpO2  03/03/15 0500 118/72 mmHg - - - - -  03/03/15 0450 (!) 98/41 mmHg 99.3 F (37.4 C) Oral 91 17 98 %  03/02/15 2204 112/64 mmHg - - 96 - -  03/02/15 2106 (!) 98/41 mmHg 98.8 F (37.1 C) Oral 96 19 97 %  03/02/15 1354 - (!) 100.6 F (38.1 C) Oral - - 95 %  03/02/15 1303 (!) 113/52 mmHg (!) 102.7 F (39.3 C) Oral (!) 113 16 91 %  03/02/15 1225 (!) 155/61 mmHg - - (!) 133 - -  03/02/15 0951 (!) 131/54 mmHg - - (!) 123 - -  03/02/15 0950 114/60 mmHg - - (!) 118 - -     Recent laboratory studies:  Recent Labs  03/01/15 0432 03/02/15 1337  WBC 8.5  --   HGB 10.2* 9.5*  HCT 32.1* 29.8*  PLT 101*  --   NA 137  --   K 4.7  --   CL 103  --   CO2 26  --   BUN 18  --   CREATININE 0.85  --   GLUCOSE 182*  --   CALCIUM 8.7*  --      Discharge Medications:     Medication List    STOP taking these medications        oxyCODONE 5 MG immediate release tablet  Commonly known as:  Oxy IR/ROXICODONE      TAKE these  medications        allopurinol 100 MG tablet  Commonly known as:  ZYLOPRIM  Take 100 mg by mouth daily.     citalopram 20 MG tablet  Commonly known as:  CELEXA  Take 20 mg by mouth daily.     furosemide 20 MG tablet  Commonly known as:  LASIX  Take 20 mg by mouth daily as needed. For fluid/swelling     gabapentin 600 MG tablet  Commonly known as:  NEURONTIN  Take 600-1,200 mg by mouth 3 (three) times daily. Take 1 in the morning, 1 in the afternoon and 2 at bedtime.     levothyroxine 175 MCG tablet  Commonly known as:  SYNTHROID, LEVOTHROID  Take 1 tablet by mouth daily.     loratadine 10 MG tablet  Commonly known as:  CLARITIN  Take 10 mg by mouth daily.     losartan 50 MG tablet  Commonly known as:  COZAAR  Take 1 tablet by mouth every morning.     methocarbamol 500 MG tablet  Commonly known as:  ROBAXIN  Take 1 tablet (500 mg total) by mouth every 6 (six) hours as needed for muscle spasms.     oxyCODONE-acetaminophen 10-325 MG tablet   Commonly known as:  PERCOCET  Take 1 tablet by mouth every 4 (four) hours as needed for pain.     rivaroxaban 10 MG Tabs tablet  Commonly known as:  XARELTO  Take 1 tablet (10 mg total) by mouth daily with breakfast.     VICTOZA 18 MG/3ML Sopn  Generic drug:  Liraglutide  Inject 1.8 mg into the skin daily.        Diagnostic Studies: Dg Knee Right Port  02/28/2015  CLINICAL DATA:  Postop EXAM: PORTABLE RIGHT KNEE - 1-2 VIEW COMPARISON:  12/09/2011 FINDINGS: Total knee arthroplasty is in place. No breakage or loosening of the hardware. Anatomic alignment of the osseous structures. IMPRESSION: Total knee arthroplasty anatomically aligned. Electronically Signed   By: Marybelle Killings M.D.   On: 02/28/2015 10:23   Dg Knee Right Port  02/28/2015  CLINICAL DATA:  Broken plump during total knee surgery, possible retained instrument EXAM: PORTABLE RIGHT KNEE - 1-2 VIEW COMPARISON:  None. FINDINGS: Single portable view of the right knee submitted. Postsurgical changes are noted distal femur and tibial plateau. Periarticular soft tissue air. No evidence of metallic instrument fragment. IMPRESSION: Postsurgical changes are noted distal femur and tibial plateau. Periarticular air intra-articular soft tissue air. No evidence of metallic instrument fragment. Electronically Signed   By: Lahoma Crocker M.D.   On: 02/28/2015 09:02    Disposition: 01-Home or Self Care      Discharge Instructions    Discharge patient    Complete by:  As directed            Follow-up Information    Follow up with [facility].   Why:  Home Health Physical Therapy   Contact information:   [address] [place] [address] [phone]       Follow up with [name], MD In 2 weeks.   Specialty:  Orthopedic Surgery   Contact information:   [place] [address] [phone]        Signed: [name] 03/03/2015, 7:33 AM

## 2015-03-04 DIAGNOSIS — R296 Repeated falls: Secondary | ICD-10-CM | POA: Diagnosis not present

## 2015-03-04 DIAGNOSIS — Z471 Aftercare following joint replacement surgery: Secondary | ICD-10-CM | POA: Diagnosis not present

## 2015-03-04 DIAGNOSIS — M479 Spondylosis, unspecified: Secondary | ICD-10-CM | POA: Diagnosis not present

## 2015-03-04 DIAGNOSIS — M109 Gout, unspecified: Secondary | ICD-10-CM | POA: Diagnosis not present

## 2015-03-04 DIAGNOSIS — I10 Essential (primary) hypertension: Secondary | ICD-10-CM | POA: Diagnosis not present

## 2015-03-04 DIAGNOSIS — E1140 Type 2 diabetes mellitus with diabetic neuropathy, unspecified: Secondary | ICD-10-CM | POA: Diagnosis not present

## 2015-03-05 DIAGNOSIS — Z471 Aftercare following joint replacement surgery: Secondary | ICD-10-CM | POA: Diagnosis not present

## 2015-03-05 DIAGNOSIS — R296 Repeated falls: Secondary | ICD-10-CM | POA: Diagnosis not present

## 2015-03-05 DIAGNOSIS — E1140 Type 2 diabetes mellitus with diabetic neuropathy, unspecified: Secondary | ICD-10-CM | POA: Diagnosis not present

## 2015-03-05 DIAGNOSIS — M479 Spondylosis, unspecified: Secondary | ICD-10-CM | POA: Diagnosis not present

## 2015-03-05 DIAGNOSIS — M109 Gout, unspecified: Secondary | ICD-10-CM | POA: Diagnosis not present

## 2015-03-05 DIAGNOSIS — I10 Essential (primary) hypertension: Secondary | ICD-10-CM | POA: Diagnosis not present

## 2015-03-07 DIAGNOSIS — E1140 Type 2 diabetes mellitus with diabetic neuropathy, unspecified: Secondary | ICD-10-CM | POA: Diagnosis not present

## 2015-03-07 DIAGNOSIS — Z471 Aftercare following joint replacement surgery: Secondary | ICD-10-CM | POA: Diagnosis not present

## 2015-03-07 DIAGNOSIS — I10 Essential (primary) hypertension: Secondary | ICD-10-CM | POA: Diagnosis not present

## 2015-03-07 DIAGNOSIS — M479 Spondylosis, unspecified: Secondary | ICD-10-CM | POA: Diagnosis not present

## 2015-03-07 DIAGNOSIS — R296 Repeated falls: Secondary | ICD-10-CM | POA: Diagnosis not present

## 2015-03-07 DIAGNOSIS — M109 Gout, unspecified: Secondary | ICD-10-CM | POA: Diagnosis not present

## 2015-03-10 DIAGNOSIS — R296 Repeated falls: Secondary | ICD-10-CM | POA: Diagnosis not present

## 2015-03-10 DIAGNOSIS — E1140 Type 2 diabetes mellitus with diabetic neuropathy, unspecified: Secondary | ICD-10-CM | POA: Diagnosis not present

## 2015-03-10 DIAGNOSIS — M109 Gout, unspecified: Secondary | ICD-10-CM | POA: Diagnosis not present

## 2015-03-10 DIAGNOSIS — Z471 Aftercare following joint replacement surgery: Secondary | ICD-10-CM | POA: Diagnosis not present

## 2015-03-10 DIAGNOSIS — I10 Essential (primary) hypertension: Secondary | ICD-10-CM | POA: Diagnosis not present

## 2015-03-10 DIAGNOSIS — M479 Spondylosis, unspecified: Secondary | ICD-10-CM | POA: Diagnosis not present

## 2015-03-12 DIAGNOSIS — R296 Repeated falls: Secondary | ICD-10-CM | POA: Diagnosis not present

## 2015-03-12 DIAGNOSIS — M109 Gout, unspecified: Secondary | ICD-10-CM | POA: Diagnosis not present

## 2015-03-12 DIAGNOSIS — I10 Essential (primary) hypertension: Secondary | ICD-10-CM | POA: Diagnosis not present

## 2015-03-12 DIAGNOSIS — E1140 Type 2 diabetes mellitus with diabetic neuropathy, unspecified: Secondary | ICD-10-CM | POA: Diagnosis not present

## 2015-03-12 DIAGNOSIS — M479 Spondylosis, unspecified: Secondary | ICD-10-CM | POA: Diagnosis not present

## 2015-03-12 DIAGNOSIS — Z471 Aftercare following joint replacement surgery: Secondary | ICD-10-CM | POA: Diagnosis not present

## 2015-03-13 DIAGNOSIS — M1711 Unilateral primary osteoarthritis, right knee: Secondary | ICD-10-CM | POA: Diagnosis not present

## 2015-03-14 ENCOUNTER — Encounter: Payer: Self-pay | Admitting: Hematology and Oncology

## 2015-03-14 DIAGNOSIS — M109 Gout, unspecified: Secondary | ICD-10-CM | POA: Diagnosis not present

## 2015-03-14 DIAGNOSIS — I10 Essential (primary) hypertension: Secondary | ICD-10-CM | POA: Diagnosis not present

## 2015-03-14 DIAGNOSIS — M479 Spondylosis, unspecified: Secondary | ICD-10-CM | POA: Diagnosis not present

## 2015-03-14 DIAGNOSIS — E1140 Type 2 diabetes mellitus with diabetic neuropathy, unspecified: Secondary | ICD-10-CM | POA: Diagnosis not present

## 2015-03-14 DIAGNOSIS — R296 Repeated falls: Secondary | ICD-10-CM | POA: Diagnosis not present

## 2015-03-14 DIAGNOSIS — Z471 Aftercare following joint replacement surgery: Secondary | ICD-10-CM | POA: Diagnosis not present

## 2015-03-17 DIAGNOSIS — R296 Repeated falls: Secondary | ICD-10-CM | POA: Diagnosis not present

## 2015-03-17 DIAGNOSIS — M479 Spondylosis, unspecified: Secondary | ICD-10-CM | POA: Diagnosis not present

## 2015-03-17 DIAGNOSIS — Z471 Aftercare following joint replacement surgery: Secondary | ICD-10-CM | POA: Diagnosis not present

## 2015-03-17 DIAGNOSIS — I10 Essential (primary) hypertension: Secondary | ICD-10-CM | POA: Diagnosis not present

## 2015-03-17 DIAGNOSIS — M109 Gout, unspecified: Secondary | ICD-10-CM | POA: Diagnosis not present

## 2015-03-17 DIAGNOSIS — E1140 Type 2 diabetes mellitus with diabetic neuropathy, unspecified: Secondary | ICD-10-CM | POA: Diagnosis not present

## 2015-03-19 DIAGNOSIS — E1140 Type 2 diabetes mellitus with diabetic neuropathy, unspecified: Secondary | ICD-10-CM | POA: Diagnosis not present

## 2015-03-19 DIAGNOSIS — R296 Repeated falls: Secondary | ICD-10-CM | POA: Diagnosis not present

## 2015-03-19 DIAGNOSIS — M479 Spondylosis, unspecified: Secondary | ICD-10-CM | POA: Diagnosis not present

## 2015-03-19 DIAGNOSIS — M109 Gout, unspecified: Secondary | ICD-10-CM | POA: Diagnosis not present

## 2015-03-19 DIAGNOSIS — I10 Essential (primary) hypertension: Secondary | ICD-10-CM | POA: Diagnosis not present

## 2015-03-19 DIAGNOSIS — Z471 Aftercare following joint replacement surgery: Secondary | ICD-10-CM | POA: Diagnosis not present

## 2015-03-21 DIAGNOSIS — I10 Essential (primary) hypertension: Secondary | ICD-10-CM | POA: Diagnosis not present

## 2015-03-21 DIAGNOSIS — E1140 Type 2 diabetes mellitus with diabetic neuropathy, unspecified: Secondary | ICD-10-CM | POA: Diagnosis not present

## 2015-03-21 DIAGNOSIS — R296 Repeated falls: Secondary | ICD-10-CM | POA: Diagnosis not present

## 2015-03-21 DIAGNOSIS — M109 Gout, unspecified: Secondary | ICD-10-CM | POA: Diagnosis not present

## 2015-03-21 DIAGNOSIS — Z471 Aftercare following joint replacement surgery: Secondary | ICD-10-CM | POA: Diagnosis not present

## 2015-03-21 DIAGNOSIS — M479 Spondylosis, unspecified: Secondary | ICD-10-CM | POA: Diagnosis not present

## 2015-03-25 DIAGNOSIS — H35372 Puckering of macula, left eye: Secondary | ICD-10-CM | POA: Diagnosis not present

## 2015-03-25 DIAGNOSIS — H43393 Other vitreous opacities, bilateral: Secondary | ICD-10-CM | POA: Diagnosis not present

## 2015-03-25 DIAGNOSIS — H43813 Vitreous degeneration, bilateral: Secondary | ICD-10-CM | POA: Diagnosis not present

## 2015-03-25 DIAGNOSIS — H35421 Microcystoid degeneration of retina, right eye: Secondary | ICD-10-CM | POA: Diagnosis not present

## 2015-05-06 ENCOUNTER — Telehealth: Payer: Self-pay | Admitting: *Deleted

## 2015-05-06 NOTE — Telephone Encounter (Signed)
rcvd message from [facility] in scheduling that pt will call back later to r/s.

## 2015-05-06 NOTE — Telephone Encounter (Signed)
-----   Message from Cephus Richer sent at 05/06/2015 10:39 AM EDT ----- Per pt had knee replacement will call to r/s at later time.

## 2015-05-08 DIAGNOSIS — N6459 Other signs and symptoms in breast: Secondary | ICD-10-CM | POA: Diagnosis not present

## 2015-05-09 ENCOUNTER — Other Ambulatory Visit: Payer: Self-pay | Admitting: Family Medicine

## 2015-05-09 DIAGNOSIS — N6452 Nipple discharge: Secondary | ICD-10-CM

## 2015-05-14 ENCOUNTER — Other Ambulatory Visit: Payer: Self-pay | Admitting: Gastroenterology

## 2015-05-14 DIAGNOSIS — K7469 Other cirrhosis of liver: Secondary | ICD-10-CM

## 2015-05-16 ENCOUNTER — Other Ambulatory Visit: Payer: Medicare Other

## 2015-05-16 ENCOUNTER — Ambulatory Visit: Payer: Medicare Other | Admitting: Hematology and Oncology

## 2015-05-20 ENCOUNTER — Other Ambulatory Visit: Payer: Self-pay | Admitting: Family Medicine

## 2015-05-20 DIAGNOSIS — N6452 Nipple discharge: Secondary | ICD-10-CM

## 2015-05-23 ENCOUNTER — Ambulatory Visit
Admission: RE | Admit: 2015-05-23 | Discharge: 2015-05-23 | Disposition: A | Discharge status: Home / Self Care | Payer: Medicare Other | Source: Ambulatory Visit | Attending: Family Medicine | Admitting: Family Medicine

## 2015-05-23 DIAGNOSIS — N6041 Mammary duct ectasia of right breast: Secondary | ICD-10-CM | POA: Diagnosis not present

## 2015-05-23 DIAGNOSIS — N6452 Nipple discharge: Secondary | ICD-10-CM

## 2015-05-23 DIAGNOSIS — R928 Other abnormal and inconclusive findings on diagnostic imaging of breast: Secondary | ICD-10-CM | POA: Diagnosis not present

## 2015-06-04 DIAGNOSIS — N6452 Nipple discharge: Secondary | ICD-10-CM | POA: Diagnosis not present

## 2015-06-05 ENCOUNTER — Other Ambulatory Visit: Payer: Self-pay | Admitting: Surgery

## 2015-06-05 DIAGNOSIS — N6452 Nipple discharge: Secondary | ICD-10-CM

## 2015-06-11 ENCOUNTER — Ambulatory Visit
Admission: RE | Admit: 2015-06-11 | Discharge: 2015-06-11 | Disposition: A | Discharge status: Home / Self Care | Payer: Medicare Other | Source: Ambulatory Visit | Attending: Surgery | Admitting: Surgery

## 2015-06-11 DIAGNOSIS — N63 Unspecified lump in breast: Secondary | ICD-10-CM | POA: Diagnosis not present

## 2015-06-11 DIAGNOSIS — N6452 Nipple discharge: Secondary | ICD-10-CM

## 2015-06-11 MED ORDER — GADOBENATE DIMEGLUMINE 529 MG/ML IV SOLN
20.0000 mL | Freq: Once | INTRAVENOUS | Status: AC | PRN
  Administered 2015-06-11: 20 mL via INTRAVENOUS

## 2015-06-17 ENCOUNTER — Other Ambulatory Visit: Payer: Self-pay | Admitting: Surgery

## 2015-06-17 DIAGNOSIS — N63 Unspecified lump in unspecified breast: Secondary | ICD-10-CM

## 2015-06-17 DIAGNOSIS — N6489 Other specified disorders of breast: Secondary | ICD-10-CM

## 2015-06-24 ENCOUNTER — Ambulatory Visit
Admission: RE | Admit: 2015-06-24 | Discharge: 2015-06-24 | Disposition: A | Discharge status: Home / Self Care | Payer: Medicare Other | Source: Ambulatory Visit | Attending: Surgery | Admitting: Surgery

## 2015-06-24 DIAGNOSIS — N63 Unspecified lump in unspecified breast: Secondary | ICD-10-CM

## 2015-06-24 DIAGNOSIS — N6489 Other specified disorders of breast: Secondary | ICD-10-CM

## 2015-06-24 DIAGNOSIS — N6012 Diffuse cystic mastopathy of left breast: Secondary | ICD-10-CM | POA: Diagnosis not present

## 2015-06-24 MED ORDER — GADOBENATE DIMEGLUMINE 529 MG/ML IV SOLN
20.0000 mL | Freq: Once | INTRAVENOUS | Status: AC | PRN
  Administered 2015-06-24: 20 mL via INTRAVENOUS

## 2015-06-25 ENCOUNTER — Ambulatory Visit: Payer: Self-pay | Admitting: Surgery

## 2015-07-01 ENCOUNTER — Ambulatory Visit
Admission: RE | Admit: 2015-07-01 | Discharge: 2015-07-01 | Disposition: A | Discharge status: Home / Self Care | Payer: Medicare Other | Source: Ambulatory Visit | Attending: Gastroenterology | Admitting: Gastroenterology

## 2015-07-01 DIAGNOSIS — K7469 Other cirrhosis of liver: Secondary | ICD-10-CM

## 2015-07-01 DIAGNOSIS — K7460 Unspecified cirrhosis of liver: Secondary | ICD-10-CM | POA: Diagnosis not present

## 2015-07-21 ENCOUNTER — Encounter (HOSPITAL_BASED_OUTPATIENT_CLINIC_OR_DEPARTMENT_OTHER): Payer: Self-pay | Admitting: *Deleted

## 2015-07-22 ENCOUNTER — Encounter (HOSPITAL_BASED_OUTPATIENT_CLINIC_OR_DEPARTMENT_OTHER)
Admission: RE | Admit: 2015-07-22 | Discharge: 2015-07-22 | Disposition: A | Discharge status: Home / Self Care | Payer: Medicare Other | Source: Ambulatory Visit | Attending: Surgery | Admitting: Surgery

## 2015-07-22 DIAGNOSIS — Z01812 Encounter for preprocedural laboratory examination: Secondary | ICD-10-CM | POA: Diagnosis not present

## 2015-07-22 DIAGNOSIS — E119 Type 2 diabetes mellitus without complications: Secondary | ICD-10-CM | POA: Diagnosis not present

## 2015-07-22 DIAGNOSIS — I10 Essential (primary) hypertension: Secondary | ICD-10-CM | POA: Insufficient documentation

## 2015-07-22 LAB — BASIC METABOLIC PANEL
ANION GAP: 8 (ref 5–15)
BUN: 12 mg/dL (ref 6–20)
CALCIUM: 9.9 mg/dL (ref 8.9–10.3)
CO2: 23 mmol/L (ref 22–32)
CREATININE: 0.84 mg/dL (ref 0.44–1.00)
Chloride: 107 mmol/L (ref 101–111)
Glucose, Bld: 206 mg/dL — ABNORMAL HIGH (ref 65–99)
Potassium: 4 mmol/L (ref 3.5–5.1)
SODIUM: 138 mmol/L (ref 135–145)

## 2015-07-22 NOTE — Progress Notes (Signed)
Dr Al Corpus aware of bmi of 52.4

## 2015-07-22 NOTE — Progress Notes (Signed)
Anesthesia consult by Dr Al Corpus cleared for surgery at [facility]

## 2015-07-25 ENCOUNTER — Ambulatory Visit: Payer: Self-pay | Admitting: Surgery

## 2015-07-25 DIAGNOSIS — R928 Other abnormal and inconclusive findings on diagnostic imaging of breast: Secondary | ICD-10-CM

## 2015-07-28 ENCOUNTER — Ambulatory Visit (HOSPITAL_BASED_OUTPATIENT_CLINIC_OR_DEPARTMENT_OTHER): Admission: RE | Admit: 2015-07-28 | Payer: Medicare Other | Source: Ambulatory Visit | Admitting: Surgery

## 2015-07-28 HISTORY — DX: Myoneural disorder, unspecified: G70.9

## 2015-07-28 SURGERY — BREAST BIOPSY
Anesthesia: General | Laterality: Left

## 2015-07-29 ENCOUNTER — Other Ambulatory Visit: Payer: Self-pay | Admitting: Surgery

## 2015-07-29 ENCOUNTER — Encounter (HOSPITAL_BASED_OUTPATIENT_CLINIC_OR_DEPARTMENT_OTHER): Payer: Self-pay | Admitting: *Deleted

## 2015-07-29 DIAGNOSIS — R928 Other abnormal and inconclusive findings on diagnostic imaging of breast: Secondary | ICD-10-CM

## 2015-08-02 HISTORY — PX: BREAST LUMPECTOMY: SHX2

## 2015-08-03 ENCOUNTER — Encounter (HOSPITAL_BASED_OUTPATIENT_CLINIC_OR_DEPARTMENT_OTHER): Payer: Self-pay | Admitting: Surgery

## 2015-08-03 DIAGNOSIS — R928 Other abnormal and inconclusive findings on diagnostic imaging of breast: Secondary | ICD-10-CM | POA: Diagnosis present

## 2015-08-03 DIAGNOSIS — N6452 Nipple discharge: Secondary | ICD-10-CM | POA: Diagnosis present
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
## 2015-08-04 ENCOUNTER — Ambulatory Visit (HOSPITAL_BASED_OUTPATIENT_CLINIC_OR_DEPARTMENT_OTHER): Payer: Medicare Other | Admitting: Anesthesiology

## 2015-08-04 ENCOUNTER — Ambulatory Visit
Admission: RE | Admit: 2015-08-04 | Discharge: 2015-08-04 | Disposition: A | Discharge status: Home / Self Care | Payer: Medicare Other | Source: Ambulatory Visit | Attending: Surgery | Admitting: Surgery

## 2015-08-04 ENCOUNTER — Encounter (HOSPITAL_BASED_OUTPATIENT_CLINIC_OR_DEPARTMENT_OTHER): Payer: Self-pay | Admitting: *Deleted

## 2015-08-04 ENCOUNTER — Ambulatory Visit (HOSPITAL_BASED_OUTPATIENT_CLINIC_OR_DEPARTMENT_OTHER)
Admission: RE | Admit: 2015-08-04 | Discharge: 2015-08-04 | Disposition: A | Discharge status: Home / Self Care | Payer: Medicare Other | Source: Ambulatory Visit | Attending: Surgery | Admitting: Surgery

## 2015-08-04 ENCOUNTER — Encounter (HOSPITAL_BASED_OUTPATIENT_CLINIC_OR_DEPARTMENT_OTHER)
Admission: RE | Disposition: A | Discharge status: Home / Self Care | Payer: Self-pay | Source: Ambulatory Visit | Attending: Surgery

## 2015-08-04 DIAGNOSIS — D0512 Intraductal carcinoma in situ of left breast: Secondary | ICD-10-CM | POA: Diagnosis not present

## 2015-08-04 DIAGNOSIS — N6452 Nipple discharge: Secondary | ICD-10-CM | POA: Diagnosis not present

## 2015-08-04 DIAGNOSIS — R928 Other abnormal and inconclusive findings on diagnostic imaging of breast: Secondary | ICD-10-CM | POA: Diagnosis not present

## 2015-08-04 DIAGNOSIS — G4730 Sleep apnea, unspecified: Secondary | ICD-10-CM | POA: Diagnosis not present

## 2015-08-04 DIAGNOSIS — I10 Essential (primary) hypertension: Secondary | ICD-10-CM | POA: Insufficient documentation

## 2015-08-04 DIAGNOSIS — C50912 Malignant neoplasm of unspecified site of left female breast: Secondary | ICD-10-CM | POA: Diagnosis not present

## 2015-08-04 DIAGNOSIS — N63 Unspecified lump in breast: Secondary | ICD-10-CM | POA: Diagnosis not present

## 2015-08-04 DIAGNOSIS — Z6841 Body Mass Index (BMI) 40.0 and over, adult: Secondary | ICD-10-CM | POA: Diagnosis not present

## 2015-08-04 DIAGNOSIS — E119 Type 2 diabetes mellitus without complications: Secondary | ICD-10-CM | POA: Diagnosis not present

## 2015-08-04 HISTORY — PX: BREAST DUCTAL SYSTEM EXCISION: SHX5242

## 2015-08-04 HISTORY — PX: BREAST BIOPSY: SHX20

## 2015-08-04 LAB — GLUCOSE, CAPILLARY
GLUCOSE-CAPILLARY: 147 mg/dL — AB (ref 65–99)
GLUCOSE-CAPILLARY: 133 mg/dL — AB (ref 65–99)

## 2015-08-04 SURGERY — BREAST BIOPSY WITH NEEDLE LOCALIZATION
Anesthesia: General | Site: Breast | Laterality: Left

## 2015-08-04 MED ORDER — DEXAMETHASONE SODIUM PHOSPHATE 4 MG/ML IJ SOLN
INTRAMUSCULAR | Status: DC | PRN
  Administered 2015-08-04: 10 mg via INTRAVENOUS

## 2015-08-04 MED ORDER — MIDAZOLAM HCL 2 MG/2ML IJ SOLN
INTRAMUSCULAR | Status: AC
  Filled 2015-08-04: qty 2

## 2015-08-04 MED ORDER — CEFAZOLIN SODIUM-DEXTROSE 2-4 GM/100ML-% IV SOLN
INTRAVENOUS | Status: AC
  Filled 2015-08-04: qty 100

## 2015-08-04 MED ORDER — DEXAMETHASONE SODIUM PHOSPHATE 10 MG/ML IJ SOLN
INTRAMUSCULAR | Status: AC
  Filled 2015-08-04: qty 1

## 2015-08-04 MED ORDER — HYDROMORPHONE HCL 1 MG/ML IJ SOLN: 0.2500 mg | INTRAMUSCULAR | Status: DC | PRN

## 2015-08-04 MED ORDER — BUPIVACAINE HCL (PF) 0.5 % IJ SOLN
INTRAMUSCULAR | Status: AC
  Filled 2015-08-04: qty 30

## 2015-08-04 MED ORDER — PHENYLEPHRINE HCL 10 MG/ML IJ SOLN
INTRAMUSCULAR | Status: DC | PRN
  Administered 2015-08-04 (×2): 80 ug via INTRAVENOUS

## 2015-08-04 MED ORDER — CHLORHEXIDINE GLUCONATE CLOTH 2 % EX PADS: 6.0000 | MEDICATED_PAD | Freq: Once | CUTANEOUS | Status: DC

## 2015-08-04 MED ORDER — BUPIVACAINE HCL (PF) 0.5 % IJ SOLN
INTRAMUSCULAR | Status: DC | PRN
  Administered 2015-08-04: 9 mL

## 2015-08-04 MED ORDER — ONDANSETRON HCL 4 MG/2ML IJ SOLN
INTRAMUSCULAR | Status: AC
  Filled 2015-08-04: qty 2

## 2015-08-04 MED ORDER — FENTANYL CITRATE (PF) 100 MCG/2ML IJ SOLN
INTRAMUSCULAR | Status: AC
  Filled 2015-08-04: qty 2

## 2015-08-04 MED ORDER — SCOPOLAMINE 1 MG/3DAYS TD PT72
1.0000 | MEDICATED_PATCH | Freq: Once | TRANSDERMAL | Status: DC | PRN
Start: 2015-08-04 — End: 2015-08-04

## 2015-08-04 MED ORDER — MIDAZOLAM HCL 5 MG/5ML IJ SOLN
INTRAMUSCULAR | Status: DC | PRN
  Administered 2015-08-04: 2 mg via INTRAVENOUS

## 2015-08-04 MED ORDER — PROPOFOL 10 MG/ML IV BOLUS
INTRAVENOUS | Status: AC
  Filled 2015-08-04: qty 40

## 2015-08-04 MED ORDER — LIDOCAINE HCL (CARDIAC) 20 MG/ML IV SOLN
INTRAVENOUS | Status: DC | PRN
  Administered 2015-08-04: 50 mg via INTRAVENOUS

## 2015-08-04 MED ORDER — PROPOFOL 10 MG/ML IV BOLUS
INTRAVENOUS | Status: DC | PRN
  Administered 2015-08-04: 200 mg via INTRAVENOUS

## 2015-08-04 MED ORDER — MIDAZOLAM HCL 2 MG/2ML IJ SOLN: 1.0000 mg | INTRAMUSCULAR | Status: DC | PRN

## 2015-08-04 MED ORDER — PROMETHAZINE HCL 25 MG/ML IJ SOLN: 6.2500 mg | INTRAMUSCULAR | Status: DC | PRN

## 2015-08-04 MED ORDER — LACTATED RINGERS IV SOLN
INTRAVENOUS | Status: DC | PRN
  Administered 2015-08-04 (×2): via INTRAVENOUS

## 2015-08-04 MED ORDER — KETOROLAC TROMETHAMINE 30 MG/ML IJ SOLN: 30.0000 mg | Freq: Once | INTRAMUSCULAR | Status: DC | PRN

## 2015-08-04 MED ORDER — CEFAZOLIN SODIUM 10 G IJ SOLR
3.0000 g | INTRAMUSCULAR | Status: DC | PRN
  Administered 2015-08-04: 3 g via INTRAVENOUS

## 2015-08-04 MED ORDER — KETOROLAC TROMETHAMINE 30 MG/ML IJ SOLN
INTRAMUSCULAR | Status: AC
  Filled 2015-08-04: qty 1

## 2015-08-04 MED ORDER — FENTANYL CITRATE (PF) 100 MCG/2ML IJ SOLN
INTRAMUSCULAR | Status: DC | PRN
  Administered 2015-08-04: 100 ug via INTRAVENOUS
  Administered 2015-08-04: 50 ug via INTRAVENOUS

## 2015-08-04 MED ORDER — KETOROLAC TROMETHAMINE 30 MG/ML IJ SOLN
INTRAMUSCULAR | Status: DC | PRN
  Administered 2015-08-04: 30 mg via INTRAVENOUS

## 2015-08-04 MED ORDER — LIDOCAINE 2% (20 MG/ML) 5 ML SYRINGE
INTRAMUSCULAR | Status: AC
  Filled 2015-08-04: qty 5

## 2015-08-04 MED ORDER — HYDROCODONE-ACETAMINOPHEN 5-325 MG PO TABS: 1.0000 | ORAL_TABLET | ORAL | Status: DC | PRN

## 2015-08-04 MED ORDER — LACTATED RINGERS IV SOLN
INTRAVENOUS | Status: DC
  Administered 2015-08-04: 11:00:00 via INTRAVENOUS

## 2015-08-04 MED ORDER — GLYCOPYRROLATE 0.2 MG/ML IJ SOLN: 0.2000 mg | Freq: Once | INTRAMUSCULAR | Status: DC | PRN

## 2015-08-04 MED ORDER — LIDOCAINE HCL (PF) 1 % IJ SOLN
INTRAMUSCULAR | Status: AC
  Filled 2015-08-04: qty 30

## 2015-08-04 MED ORDER — FENTANYL CITRATE (PF) 100 MCG/2ML IJ SOLN: 50.0000 ug | INTRAMUSCULAR | Status: DC | PRN

## 2015-08-04 MED ORDER — CEFAZOLIN SODIUM-DEXTROSE 2-4 GM/100ML-% IV SOLN: 2.0000 g | INTRAVENOUS | Status: DC

## 2015-08-04 MED ORDER — CEFAZOLIN IN D5W 1 GM/50ML IV SOLN
INTRAVENOUS | Status: AC
  Filled 2015-08-04: qty 50

## 2015-08-04 SURGICAL SUPPLY — 43 items
APL SKNCLS STERI-STRIP NONHPOA (GAUZE/BANDAGES/DRESSINGS) ×1
BENZOIN TINCTURE PRP APPL 2/3 (GAUZE/BANDAGES/DRESSINGS) ×2 IMPLANT
BLADE HEX COATED 2.75 (ELECTRODE) ×2 IMPLANT
BLADE SURG 15 STRL LF DISP TIS (BLADE) ×1 IMPLANT
BLADE SURG 15 STRL SS (BLADE) ×2
CANISTER SUCT 1200ML W/VALVE (MISCELLANEOUS) IMPLANT
CHLORAPREP W/TINT 26ML (MISCELLANEOUS) ×2 IMPLANT
CLIP TI WIDE RED SMALL 6 (CLIP) IMPLANT
COVER BACK TABLE 60X90IN (DRAPES) ×2 IMPLANT
COVER MAYO STAND STRL (DRAPES) ×2 IMPLANT
DECANTER SPIKE VIAL GLASS SM (MISCELLANEOUS) ×2 IMPLANT
DEVICE DUBIN W/COMP PLATE 8390 (MISCELLANEOUS) ×2 IMPLANT
DRAPE LAPAROTOMY 100X72 PEDS (DRAPES) ×2 IMPLANT
DRAPE UTILITY XL STRL (DRAPES) ×2 IMPLANT
ELECT REM PT RETURN 9FT ADLT (ELECTROSURGICAL) ×2
ELECTRODE REM PT RTRN 9FT ADLT (ELECTROSURGICAL) ×1 IMPLANT
GLOVE BIO SURGEON STRL SZ7 (GLOVE) ×2 IMPLANT
GLOVE BIOGEL PI IND STRL 6.5 (GLOVE) ×1 IMPLANT
GLOVE BIOGEL PI IND STRL 7.5 (GLOVE) ×1 IMPLANT
GLOVE BIOGEL PI INDICATOR 6.5 (GLOVE) ×1
GLOVE BIOGEL PI INDICATOR 7.5 (GLOVE) ×1
GLOVE SURG ORTHO 8.0 STRL STRW (GLOVE) ×2 IMPLANT
GOWN STRL REUS W/ TWL LRG LVL3 (GOWN DISPOSABLE) ×1 IMPLANT
GOWN STRL REUS W/ TWL XL LVL3 (GOWN DISPOSABLE) ×1 IMPLANT
GOWN STRL REUS W/TWL LRG LVL3 (GOWN DISPOSABLE) ×2
GOWN STRL REUS W/TWL XL LVL3 (GOWN DISPOSABLE) ×2
KIT MARKER MARGIN INK (KITS) ×2 IMPLANT
NEEDLE HYPO 25X1 1.5 SAFETY (NEEDLE) ×2 IMPLANT
NS IRRIG 1000ML POUR BTL (IV SOLUTION) ×2 IMPLANT
PACK BASIN DAY SURGERY FS (CUSTOM PROCEDURE TRAY) ×2 IMPLANT
PENCIL BUTTON HOLSTER BLD 10FT (ELECTRODE) ×2 IMPLANT
SLEEVE SCD COMPRESS KNEE MED (MISCELLANEOUS) ×2 IMPLANT
SPONGE LAP 4X18 X RAY DECT (DISPOSABLE) ×2 IMPLANT
STRIP CLOSURE SKIN 1/2X4 (GAUZE/BANDAGES/DRESSINGS) ×2 IMPLANT
SUT MNCRL AB 4-0 PS2 18 (SUTURE) ×4 IMPLANT
SUT VIC AB 3-0 SH 27 (SUTURE) ×2
SUT VIC AB 3-0 SH 27X BRD (SUTURE) ×1 IMPLANT
SUT VICRYL 4-0 PS2 18IN ABS (SUTURE) IMPLANT
SYR CONTROL 10ML LL (SYRINGE) ×2 IMPLANT
TOWEL OR 17X24 6PK STRL BLUE (TOWEL DISPOSABLE) ×2 IMPLANT
TOWEL OR NON WOVEN STRL DISP B (DISPOSABLE) ×2 IMPLANT
TUBE CONNECTING 20X1/4 (TUBING) IMPLANT
YANKAUER SUCT BULB TIP NO VENT (SUCTIONS) IMPLANT

## 2015-08-04 NOTE — Anesthesia Preprocedure Evaluation (Addendum)
Anesthesia Evaluation  Patient identified by MRN, date of birth, ID band Patient awake    Reviewed: Allergy & Precautions, NPO status , Patient's Chart, lab work & pertinent test results  Airway Mallampati: II  TM Distance: <3 FB Neck ROM: Full    Dental no notable dental hx.    Pulmonary sleep apnea and Continuous Positive Airway Pressure Ventilation ,    Pulmonary exam normal breath sounds clear to auscultation       Cardiovascular hypertension, Normal cardiovascular exam Rhythm:Regular Rate:Normal     Neuro/Psych negative neurological ROS  negative psych ROS   GI/Hepatic negative GI ROS, Neg liver ROS,   Endo/Other  diabetesMorbid obesity  Renal/GU negative Renal ROS  negative genitourinary   Musculoskeletal negative musculoskeletal ROS (+)   Abdominal (+) + obese,   Peds negative pediatric ROS (+)  Hematology  (+) Blood dyscrasia, , thrombocytopenia   Anesthesia Other Findings   Reproductive/Obstetrics negative OB ROS                            Anesthesia Physical Anesthesia Plan  ASA: III  Anesthesia Plan: General   Post-op Pain Management:    Induction: Intravenous  Airway Management Planned: LMA and Oral ETT  Additional Equipment:   Intra-op Plan:   Post-operative Plan: Extubation in OR  Informed Consent: I have reviewed the patients History and Physical, chart, labs and discussed the procedure including the risks, benefits and alternatives for the proposed anesthesia with the patient or authorized representative who has indicated his/her understanding and acceptance.   Dental advisory given  Plan Discussed with: CRNA and Surgeon  Anesthesia Plan Comments:         Anesthesia Quick Evaluation

## 2015-08-04 NOTE — Anesthesia Procedure Notes (Signed)
Procedure Name: LMA Insertion Date/Time: 08/04/2015 10:44 AM Performed by: Toula Moos L Pre-anesthesia Checklist: Patient identified, Emergency Drugs available, Suction available, Patient being monitored and Timeout performed Patient Re-evaluated:Patient Re-evaluated prior to inductionOxygen Delivery Method: Circle system utilized Preoxygenation: Pre-oxygenation with 100% oxygen Intubation Type: IV induction Ventilation: Mask ventilation without difficulty LMA: LMA inserted LMA Size: 4.0 Number of attempts: 1 Airway Equipment and Method: Bite block Placement Confirmation: positive ETCO2 Tube secured with: Tape Dental Injury: Teeth and Oropharynx as per pre-operative assessment

## 2015-08-04 NOTE — Brief Op Note (Signed)
08/04/2015  12:32 PM  PATIENT:  [name] [name]  65 y.o. female  PRE-OPERATIVE DIAGNOSIS:  abnormal mammogram and nipple discharge  POST-OPERATIVE DIAGNOSIS:  abnormal mammogram and nipple discharge  PROCEDURE:  Left breast wire-loc excisional biopsy  SURGEON:  Surgeon(s) and Role:    * [name], MD - Primary  PHYSICIAN ASSISTANT:   ASSISTANTS: none   ANESTHESIA:   general  EBL:  Total I/O In: 1500 [I.V.:1500] Out: -   BLOOD ADMINISTERED:none  DRAINS: none   LOCAL MEDICATIONS USED:  MARCAINE     SPECIMEN:  Excision  DISPOSITION OF SPECIMEN:  PATHOLOGY  COUNTS:  YES  TOURNIQUET:  * No tourniquets in log *  DICTATION: .Other Dictation: Dictation Number [id number]  PLAN OF CARE: Discharge to home after PACU  PATIENT DISPOSITION:  PACU - hemodynamically stable.   Delay start of Pharmacological VTE agent (>24hrs) due to surgical blood loss or risk of bleeding: yes  Earnstine Regal, MD, [facility] Office: [phone]

## 2015-08-04 NOTE — Interval H&P Note (Signed)
History and Physical Interval Note:  08/04/2015 11:32 AM  [name] [name]  has presented today for surgery, with the diagnosis of abnormal mammogram and nipple discharge.  The various methods of treatment have been discussed with the patient and family. After consideration of risks, benefits and other options for treatment, the patient has consented to    Procedure(s): LEFT BREAST BIOPSY WITH NEEDLE LOCALIZATION (Left) LEFT EXCISION DUCTAL SYSTEM BREAST (Left) as a surgical intervention .    The patient's history has been reviewed, patient examined, no change in status, stable for surgery.  I have reviewed the patient's chart and labs.  Questions were answered to the patient's satisfaction.    Earnstine Regal, MD, [facility] Office: [place]

## 2015-08-04 NOTE — Transfer of Care (Signed)
Immediate Anesthesia Transfer of Care Note  Patient: [name] [name]  Procedure(s) Performed: Procedure(s): LEFT BREAST BIOPSY WITH NEEDLE LOCALIZATION (Left) LEFT EXCISION DUCTAL SYSTEM BREAST (Left)  Patient Location: PACU  Anesthesia Type:General  Level of Consciousness: awake  Airway & Oxygen Therapy: Patient Spontanous Breathing and Patient connected to face mask oxygen  Post-op Assessment: Report given to RN and Post -op Vital signs reviewed and stable  Post vital signs: Reviewed and stable  Last Vitals:  Filed Vitals:   08/04/15 1018  BP: 123/64  Pulse: 81  Temp: 36.8 C  Resp: 20    Last Pain: There were no vitals filed for this visit.       Complications: No apparent anesthesia complications

## 2015-08-04 NOTE — Anesthesia Postprocedure Evaluation (Signed)
Anesthesia Post Note  Patient: [name] [name]  Procedure(s) Performed: Procedure(s) (LRB): LEFT BREAST BIOPSY WITH NEEDLE LOCALIZATION (Left) LEFT EXCISION DUCTAL SYSTEM BREAST (Left)  Patient location during evaluation: PACU Anesthesia Type: General Level of consciousness: awake and alert Pain management: pain level controlled Vital Signs Assessment: post-procedure vital signs reviewed and stable Respiratory status: spontaneous breathing, nonlabored ventilation, respiratory function stable and patient connected to nasal cannula oxygen Cardiovascular status: blood pressure returned to baseline and stable Postop Assessment: no signs of nausea or vomiting Anesthetic complications: no    Last Vitals:  Filed Vitals:   08/04/15 1237 08/04/15 1245  BP:  136/77  Pulse: 92 81  Temp: 36.5 C   Resp: 16 16    Last Pain:  Filed Vitals:   08/04/15 1251  PainSc: 0-No pain                 Ark Agrusa S

## 2015-08-04 NOTE — Discharge Instructions (Signed)
°  Post Anesthesia Home Care Instructions ° °Activity: °Get plenty of rest for the remainder of the day. A responsible adult should stay with you for 24 hours following the procedure.  °For the next 24 hours, DO NOT: °-Drive a car °-Operate machinery °-Drink alcoholic beverages °-Take any medication unless instructed by your physician °-Make any legal decisions or sign important papers. ° °Meals: °Start with liquid foods such as gelatin or soup. Progress to regular foods as tolerated. Avoid greasy, spicy, heavy foods. If nausea and/or vomiting occur, drink only clear liquids until the nausea and/or vomiting subsides. Call your physician if vomiting continues. ° °Special Instructions/Symptoms: °Your throat Co[place] feel dry or sore from the anesthesia or the breathing tube placed in your throat during surgery. If this causes discomfort, gargle with warm salt water. The discomfort should disappear within 24 hours. ° °If you had a scopolamine patch placed behind your ear for the management of post- operative nausea and/or vomiting: ° °1. The medication in the patch is effective for 72 hours, after which it should be removed.  Wrap patch in a tissue and discard in the trash. Wash hands thoroughly with soap and water. °2. You [place] remove the patch earlier than 72 hours if you experience unpleasant side effects which [place] include dry mouth, dizziness or visual disturbances. °3. Avoid touching the patch. Wash your hands with soap and water after contact with the patch. °  ° °Call your surgeon if you experience:  ° °1.  Fever over 101.0. °2.  Inability to urinate. °3.  Nausea and/or vomiting. °4.  Extreme swelling or bruising at the surgical site. °5.  Continued bleeding from the incision. °6.  Increased pain, redness or drainage from the incision. °7.  Problems related to your pain medication. °8.  Any problems and/or concerns ° °

## 2015-08-06 NOTE — Op Note (Signed)
NAMETYRICE, MEDENDORP                  ACCOUNT NO.:  [id number]  MEDICAL RECORD NO.:  [id number]  LOCATION:                                 FACILITY:  PHYSICIAN:  Earnstine Regal, MD      DATE OF BIRTH:  1950-06-29  DATE OF PROCEDURE:  08/04/2015                              OPERATIVE REPORT   PREOPERATIVE DIAGNOSES: 1. Left breast nipple discharge. 2. Abnormal mammogram, left breast.  POSTOPERATIVE DIAGNOSES: 1. Left breast nipple discharge. 2. Abnormal mammogram, left breast.  PROCEDURE:  Left breast wire localized excisional biopsy.  SURGEON:  Earnstine Regal, MD, FACS  ANESTHESIA:  General.  ESTIMATED BLOOD LOSS:  Minimal.  PREPARATION:  ChloraPrep.  COMPLICATIONS:  None.  INDICATIONS:  The patient is a 65 year old female, who presented with left nipple discharge.  MRI scan showed an abnormality in the upper left breast.  Needle biopsy was discordant with the mammographic findings. The patient now comes to Surgery for wire localized excisional biopsy.  BODY OF REPORT:  Procedure was done in OR #1 at the [facility].  The patient was brought to the operating room, placed in supine position on the operating room table.  Following administration of general anesthesia, the patient was positioned and then prepped and draped in the usual aseptic fashion.  After ascertaining that an adequate level of anesthesia had been achieved, the skin at the site of guidewire insertion at the superior edge of the areola is anesthetized with local anesthetic.  A curvilinear incision is made with a #15 blade along the edge of the areola.  Dissection was carried into the subcutaneous tissues using the electrocautery for hemostasis.  Guidewire was freed from the skin edges.  Using retractors, the breast tissue surrounding the guidewire was excised.  Dissection was carried deep to the tip of the guidewire and underneath of the areola. The entire specimen is excised using the  electrocautery for hemostasis. A specimen mammogram was performed which confirms the guidewire and the previously placed marking clip are present within the specimen. Specimen was submitted to Pathology for review.  Good hemostasis was obtained throughout the wound.  The subcutaneous tissues were reapproximated with interrupted 3-0 Vicryl simple sutures. Skin was anesthetized with local anesthetic.  Skin edges were reapproximated with a running 4-0 Monocryl subcuticular suture.  Wound is washed and dried and Dermabond is applied.  The patient is awakened from anesthesia and brought to the recovery room.  The patient tolerated the procedure well.   Earnstine Regal, MD, [facility] Office: [phone]    TMG/MEDQ  D:  08/04/2015  T:  08/04/2015  Job:  [id number]  cc:   Antony Blackbird, MD

## 2015-08-11 ENCOUNTER — Encounter: Payer: Self-pay | Admitting: Oncology

## 2015-08-11 ENCOUNTER — Encounter (HOSPITAL_BASED_OUTPATIENT_CLINIC_OR_DEPARTMENT_OTHER): Payer: Self-pay | Admitting: Surgery

## 2015-08-11 ENCOUNTER — Telehealth: Payer: Self-pay | Admitting: Oncology

## 2015-08-11 ENCOUNTER — Telehealth: Payer: Self-pay | Admitting: *Deleted

## 2015-08-11 NOTE — Telephone Encounter (Signed)
TC from patient's son to confirm pt's appt. Informed pt that her appts are on the 18th of July for [facility] and Dr. Jana Hakim and gave him the times. He voiced understanding.

## 2015-08-11 NOTE — Telephone Encounter (Signed)
Patient called to schedule appointment. Appointment has been made with Dr. Jana Hakim on 7/18 at 4:30pm. Patient aware to arrive by 4pm. Demographics verified. Letter to the referring.

## 2015-08-14 ENCOUNTER — Telehealth: Payer: Self-pay | Admitting: *Deleted

## 2015-08-14 NOTE — Telephone Encounter (Signed)
Received appt date/time from Andrea.  Mailed new pt packet to pt.  

## 2015-08-18 ENCOUNTER — Other Ambulatory Visit: Payer: Self-pay | Admitting: *Deleted

## 2015-08-19 ENCOUNTER — Telehealth: Payer: Self-pay | Admitting: *Deleted

## 2015-08-19 ENCOUNTER — Other Ambulatory Visit: Payer: Medicare Other

## 2015-08-19 ENCOUNTER — Encounter: Payer: Self-pay | Admitting: *Deleted

## 2015-08-19 ENCOUNTER — Ambulatory Visit (HOSPITAL_BASED_OUTPATIENT_CLINIC_OR_DEPARTMENT_OTHER): Payer: Medicare Other | Admitting: Oncology

## 2015-08-19 VITALS — BP 155/83 | HR 96 | Temp 98.5°F | Resp 18 | Ht 61.0 in | Wt 264.3 lb

## 2015-08-19 DIAGNOSIS — Z170 Estrogen receptor positive status [ER+]: Secondary | ICD-10-CM

## 2015-08-19 DIAGNOSIS — D696 Thrombocytopenia, unspecified: Secondary | ICD-10-CM

## 2015-08-19 DIAGNOSIS — C50412 Malignant neoplasm of upper-outer quadrant of left female breast: Secondary | ICD-10-CM

## 2015-08-19 NOTE — Telephone Encounter (Signed)
Received order per Dr. Jana Hakim for oncotype testing. Requisition sent to pathology.
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
## 2015-08-20 ENCOUNTER — Telehealth: Payer: Self-pay | Admitting: *Deleted

## 2015-08-20 NOTE — Telephone Encounter (Signed)
  Oncology Nurse Navigator Documentation  Navigator Location: [facility]Med Onc (08/20/15 1600) Navigator Encounter Type: Introductory phone call;Initial MedOnc (08/20/15 1600)   Abnormal Finding Date: 08/04/15 (08/20/15 1600) Confirmed Diagnosis Date: 08/07/15 (08/20/15 1600) Surgery Date: 08/04/15 (08/20/15 1600) Treatment Initiated Date: 08/04/15 (08/20/15 1600) Patient Visit Type: MedOnc;Initial (08/20/15 1600) Treatment Phase: Pre-Tx/Tx Discussion (08/20/15 1600) Barriers/Navigation Needs: No barriers at this time (08/20/15 1600)                Acuity: Level 2 (08/20/15 1600)   Acuity Level 2: Initial guidance, education and coordination as needed;Educational needs;Assistance expediting appointments;Ongoing guidance and education throughout treatment as needed (08/20/15 1600)     Time Spent with Patient: 15 (08/20/15 1600)

## 2015-08-22 NOTE — Progress Notes (Signed)
Location of Breast Cancer: left breast  Histology per Pathology Report:   08/04/15 Diagnosis Breast, lumpectomy, Left - INVASIVE AND IN SITU DUCTAL CARCINOMA, 1.2 CM. - TUMOR FOCALLY INVOLVES SUPERIOR MARGIN. - FIBROCYSTIC CHANGES AND PREVIOUS BIOPSY SITE. - SEE ONCOLOGY TABLE.  06/24/15 Diagnosis Breast, left, needle core biopsy, 12:30 o'clock - FIBROCYSTIC CHANGES WITH FEATURES OF RUPTURE. - THERE IS NO EVIDENCE OF MALIGNANCY. - SEE COMMENT.  Receptor Status: ER(95%), PR (95%), Her2-neu (negative), Ki-(10%)  Did patient present with symptoms (if so, please note symptoms) or was this found on screening mammography?: patient has had intermittent drainage from the left nipple approximately 1 year ago.   Past/Anticipated interventions by surgeon, if any: 08/04/15 - Procedure: LEFT EXCISION DUCTAL SYSTEM BREAST;  Surgeon: [name], MD, Procedure: LEFT BREAST BIOPSY WITH NEEDLE LOCALIZATION;  Surgeon: [name], MD  Past/Anticipated interventions by medical oncology, if any: waiting on oncotype results, antiestrogen therapy after radiation  Lymphedema issues, if any:  no    Pain issues, if any:  no  Ob Gyn history;  Patient was 13 years with first menses, she was 80 years with birth of first child, she has 3 children, she has not used hormone replacement.  SAFETY ISSUES:  Prior radiation? no  Pacemaker/ICD? no  Possible current pregnancy?no  Is the patient on methotrexate? no  Current Complaints / other details:  Patient is here with her husband and aunt.  She will have see Dr. [name] on 09/03/15.  BP 134/64 (BP Location: Right Arm, Patient Position: Sitting)   Pulse 78   Temp 97.6 F (36.4 C) (Oral)   Ht 5' 1"  (1.549 m)   Wt 267 lb 3.2 oz (121.2 kg)   SpO2 100%   BMI 50.49 kg/m     Wt Readings from Last 3 Encounters:  08/28/15 267 lb 3.2 oz (121.2 kg)  08/19/15 264 lb 4.8 oz (119.9 kg)  08/04/15 264 lb 6 oz (119.9 kg)      Jacqulyn Liner, RN 08/22/2015,3:07  PM

## 2015-08-27 ENCOUNTER — Telehealth: Payer: Self-pay | Admitting: Oncology

## 2015-08-27 NOTE — Telephone Encounter (Signed)
Spoke with patient re 10/2 lab/fu per 7/18 LOS

## 2015-08-28 ENCOUNTER — Ambulatory Visit
Admission: RE | Admit: 2015-08-28 | Discharge: 2015-08-28 | Disposition: A | Discharge status: Home / Self Care | Payer: Medicare Other | Source: Ambulatory Visit | Attending: Radiation Oncology | Admitting: Radiation Oncology

## 2015-08-28 ENCOUNTER — Encounter: Payer: Self-pay | Admitting: Radiation Oncology

## 2015-08-28 VITALS — BP 134/64 | HR 78 | Temp 97.6°F | Ht 61.0 in | Wt 267.2 lb

## 2015-08-28 DIAGNOSIS — M1990 Unspecified osteoarthritis, unspecified site: Secondary | ICD-10-CM | POA: Insufficient documentation

## 2015-08-28 DIAGNOSIS — Z510 Encounter for antineoplastic radiation therapy: Secondary | ICD-10-CM | POA: Insufficient documentation

## 2015-08-28 DIAGNOSIS — G4730 Sleep apnea, unspecified: Secondary | ICD-10-CM | POA: Insufficient documentation

## 2015-08-28 DIAGNOSIS — C50412 Malignant neoplasm of upper-outer quadrant of left female breast: Secondary | ICD-10-CM | POA: Diagnosis not present

## 2015-08-28 DIAGNOSIS — I10 Essential (primary) hypertension: Secondary | ICD-10-CM | POA: Insufficient documentation

## 2015-08-28 DIAGNOSIS — Z885 Allergy status to narcotic agent status: Secondary | ICD-10-CM | POA: Insufficient documentation

## 2015-08-28 DIAGNOSIS — Z170 Estrogen receptor positive status [ER+]: Secondary | ICD-10-CM | POA: Diagnosis not present

## 2015-08-28 DIAGNOSIS — Z79899 Other long term (current) drug therapy: Secondary | ICD-10-CM | POA: Diagnosis not present

## 2015-08-28 DIAGNOSIS — M109 Gout, unspecified: Secondary | ICD-10-CM | POA: Diagnosis not present

## 2015-08-28 DIAGNOSIS — F419 Anxiety disorder, unspecified: Secondary | ICD-10-CM | POA: Insufficient documentation

## 2015-08-28 DIAGNOSIS — E079 Disorder of thyroid, unspecified: Secondary | ICD-10-CM | POA: Diagnosis not present

## 2015-08-28 DIAGNOSIS — E1140 Type 2 diabetes mellitus with diabetic neuropathy, unspecified: Secondary | ICD-10-CM | POA: Insufficient documentation

## 2015-08-28 DIAGNOSIS — G709 Myoneural disorder, unspecified: Secondary | ICD-10-CM | POA: Diagnosis not present

## 2015-08-28 NOTE — Progress Notes (Signed)
Please see the Nurse Progress Note in the MD Initial Consult Encounter for this patient. 
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
## 2015-08-29 DIAGNOSIS — C50412 Malignant neoplasm of upper-outer quadrant of left female breast: Secondary | ICD-10-CM | POA: Diagnosis not present

## 2015-09-01 DIAGNOSIS — Z79899 Other long term (current) drug therapy: Secondary | ICD-10-CM | POA: Diagnosis not present

## 2015-09-01 DIAGNOSIS — E039 Hypothyroidism, unspecified: Secondary | ICD-10-CM | POA: Diagnosis not present

## 2015-09-01 DIAGNOSIS — Z170 Estrogen receptor positive status [ER+]: Secondary | ICD-10-CM | POA: Diagnosis not present

## 2015-09-01 DIAGNOSIS — G4730 Sleep apnea, unspecified: Secondary | ICD-10-CM | POA: Diagnosis not present

## 2015-09-01 DIAGNOSIS — M170 Bilateral primary osteoarthritis of knee: Secondary | ICD-10-CM | POA: Diagnosis not present

## 2015-09-01 DIAGNOSIS — C50912 Malignant neoplasm of unspecified site of left female breast: Secondary | ICD-10-CM | POA: Diagnosis not present

## 2015-09-01 DIAGNOSIS — E785 Hyperlipidemia, unspecified: Secondary | ICD-10-CM | POA: Diagnosis not present

## 2015-09-01 DIAGNOSIS — E1143 Type 2 diabetes mellitus with diabetic autonomic (poly)neuropathy: Secondary | ICD-10-CM | POA: Diagnosis not present

## 2015-09-03 ENCOUNTER — Ambulatory Visit: Payer: Self-pay | Admitting: Surgery

## 2015-09-03 DIAGNOSIS — C50912 Malignant neoplasm of unspecified site of left female breast: Secondary | ICD-10-CM

## 2015-09-04 ENCOUNTER — Telehealth: Payer: Self-pay | Admitting: *Deleted

## 2015-09-04 NOTE — Telephone Encounter (Signed)
Received Oncotype Dx results of 12/8%.  Placed a copy in Dr. Virgie Dad box.  [name] [name] a copy and took a copy to HIM to scan.

## 2015-09-04 NOTE — Telephone Encounter (Signed)
Left message to discuss oncotype results of 12/8/%

## 2015-09-08 ENCOUNTER — Telehealth: Payer: Self-pay | Admitting: *Deleted

## 2015-09-08 ENCOUNTER — Encounter (HOSPITAL_BASED_OUTPATIENT_CLINIC_OR_DEPARTMENT_OTHER): Payer: Self-pay | Admitting: *Deleted

## 2015-09-08 ENCOUNTER — Encounter (HOSPITAL_COMMUNITY): Payer: Self-pay

## 2015-09-08 NOTE — Telephone Encounter (Signed)
Error

## 2015-09-09 ENCOUNTER — Encounter (HOSPITAL_BASED_OUTPATIENT_CLINIC_OR_DEPARTMENT_OTHER)
Admission: RE | Admit: 2015-09-09 | Discharge: 2015-09-09 | Disposition: A | Discharge status: Home / Self Care | Payer: Medicare Other | Source: Ambulatory Visit | Attending: Surgery | Admitting: Surgery

## 2015-09-09 ENCOUNTER — Ambulatory Visit: Payer: Self-pay | Admitting: Surgery

## 2015-09-09 DIAGNOSIS — C50912 Malignant neoplasm of unspecified site of left female breast: Secondary | ICD-10-CM | POA: Diagnosis present

## 2015-09-09 DIAGNOSIS — F419 Anxiety disorder, unspecified: Secondary | ICD-10-CM | POA: Diagnosis not present

## 2015-09-09 DIAGNOSIS — D0512 Intraductal carcinoma in situ of left breast: Secondary | ICD-10-CM | POA: Diagnosis not present

## 2015-09-09 DIAGNOSIS — E119 Type 2 diabetes mellitus without complications: Secondary | ICD-10-CM | POA: Diagnosis not present

## 2015-09-09 LAB — BASIC METABOLIC PANEL
Anion gap: 10 (ref 5–15)
BUN: 11 mg/dL (ref 6–20)
CALCIUM: 9.9 mg/dL (ref 8.9–10.3)
CO2: 23 mmol/L (ref 22–32)
CREATININE: 0.95 mg/dL (ref 0.44–1.00)
Chloride: 105 mmol/L (ref 101–111)
GFR calc non Af Amer: >60 mL/min (ref >60)
Glucose, Bld: 235 mg/dL — ABNORMAL HIGH (ref 65–99)
Potassium: 3.8 mmol/L (ref 3.5–5.1)
SODIUM: 138 mmol/L (ref 135–145)

## 2015-09-09 NOTE — Progress Notes (Signed)
Pt given 8 oz bottle of water with written and verbal instructions to drink at 445 am morning of surgery, and no other liquids after midnight, (teach back) voiced understanding.  Also anesthesia consult by Dr Lamarr Lulas for bmi 49.  Pt had surgery here July no change in physically, cleared for surgery here.

## 2015-09-12 ENCOUNTER — Ambulatory Visit (HOSPITAL_BASED_OUTPATIENT_CLINIC_OR_DEPARTMENT_OTHER): Payer: Medicare Other | Admitting: Certified Registered"

## 2015-09-12 ENCOUNTER — Ambulatory Visit (HOSPITAL_BASED_OUTPATIENT_CLINIC_OR_DEPARTMENT_OTHER)
Admission: RE | Admit: 2015-09-12 | Discharge: 2015-09-12 | Disposition: A | Discharge status: Home / Self Care | Payer: Medicare Other | Source: Ambulatory Visit | Attending: Surgery | Admitting: Surgery

## 2015-09-12 ENCOUNTER — Ambulatory Visit (HOSPITAL_COMMUNITY)
Admission: RE | Admit: 2015-09-12 | Discharge: 2015-09-12 | Disposition: A | Discharge status: Home / Self Care | Payer: Medicare Other | Source: Ambulatory Visit | Attending: Surgery | Admitting: Surgery

## 2015-09-12 ENCOUNTER — Encounter (HOSPITAL_BASED_OUTPATIENT_CLINIC_OR_DEPARTMENT_OTHER): Payer: Self-pay | Admitting: *Deleted

## 2015-09-12 ENCOUNTER — Encounter (HOSPITAL_BASED_OUTPATIENT_CLINIC_OR_DEPARTMENT_OTHER)
Admission: RE | Disposition: A | Discharge status: Home / Self Care | Payer: Self-pay | Source: Ambulatory Visit | Attending: Surgery

## 2015-09-12 DIAGNOSIS — F419 Anxiety disorder, unspecified: Secondary | ICD-10-CM | POA: Insufficient documentation

## 2015-09-12 DIAGNOSIS — Z170 Estrogen receptor positive status [ER+]: Secondary | ICD-10-CM

## 2015-09-12 DIAGNOSIS — E119 Type 2 diabetes mellitus without complications: Secondary | ICD-10-CM | POA: Diagnosis not present

## 2015-09-12 DIAGNOSIS — I10 Essential (primary) hypertension: Secondary | ICD-10-CM | POA: Diagnosis not present

## 2015-09-12 DIAGNOSIS — R079 Chest pain, unspecified: Secondary | ICD-10-CM | POA: Diagnosis not present

## 2015-09-12 DIAGNOSIS — D0512 Intraductal carcinoma in situ of left breast: Secondary | ICD-10-CM | POA: Diagnosis not present

## 2015-09-12 DIAGNOSIS — G8918 Other acute postprocedural pain: Secondary | ICD-10-CM | POA: Diagnosis not present

## 2015-09-12 DIAGNOSIS — C50912 Malignant neoplasm of unspecified site of left female breast: Secondary | ICD-10-CM | POA: Diagnosis not present

## 2015-09-12 DIAGNOSIS — C50911 Malignant neoplasm of unspecified site of right female breast: Secondary | ICD-10-CM | POA: Diagnosis not present

## 2015-09-12 DIAGNOSIS — C50412 Malignant neoplasm of upper-outer quadrant of left female breast: Secondary | ICD-10-CM

## 2015-09-12 HISTORY — PX: BREAST LUMPECTOMY WITH AXILLARY LYMPH NODE BIOPSY: SHX5593

## 2015-09-12 LAB — GLUCOSE, CAPILLARY
Glucose-Capillary: 187 mg/dL — ABNORMAL HIGH (ref 65–99)
Glucose-Capillary: 224 mg/dL — ABNORMAL HIGH (ref 65–99)

## 2015-09-12 SURGERY — BREAST LUMPECTOMY WITH AXILLARY LYMPH NODE BIOPSY
Anesthesia: General | Site: Breast | Laterality: Left

## 2015-09-12 MED ORDER — SODIUM CHLORIDE 0.9 % IJ SOLN
INTRAMUSCULAR | Status: AC
  Filled 2015-09-12: qty 10

## 2015-09-12 MED ORDER — PROPOFOL 10 MG/ML IV BOLUS
INTRAVENOUS | Status: DC | PRN
  Administered 2015-09-12: 150 mg via INTRAVENOUS

## 2015-09-12 MED ORDER — HYDROMORPHONE HCL 1 MG/ML IJ SOLN
0.2500 mg | INTRAMUSCULAR | Status: DC | PRN
  Administered 2015-09-12 (×2): 0.5 mg via INTRAVENOUS

## 2015-09-12 MED ORDER — OXYCODONE HCL 5 MG/5ML PO SOLN: 5.0000 mg | Freq: Once | ORAL | Status: AC | PRN

## 2015-09-12 MED ORDER — CEFAZOLIN IN D5W 1 GM/50ML IV SOLN
INTRAVENOUS | Status: AC
  Filled 2015-09-12: qty 50

## 2015-09-12 MED ORDER — DEXTROSE 5 % IV SOLN
3.0000 g | INTRAVENOUS | Status: AC
  Administered 2015-09-12: 3 g via INTRAVENOUS

## 2015-09-12 MED ORDER — DEXAMETHASONE SODIUM PHOSPHATE 4 MG/ML IJ SOLN
INTRAMUSCULAR | Status: DC | PRN
  Administered 2015-09-12: 4 mg via INTRAVENOUS

## 2015-09-12 MED ORDER — CHLORHEXIDINE GLUCONATE CLOTH 2 % EX PADS: 6.0000 | MEDICATED_PAD | Freq: Once | CUTANEOUS | Status: DC

## 2015-09-12 MED ORDER — MIDAZOLAM HCL 2 MG/2ML IJ SOLN
INTRAMUSCULAR | Status: AC
  Filled 2015-09-12: qty 2

## 2015-09-12 MED ORDER — SCOPOLAMINE 1 MG/3DAYS TD PT72: 1.0000 | MEDICATED_PATCH | Freq: Once | TRANSDERMAL | Status: DC | PRN

## 2015-09-12 MED ORDER — MIDAZOLAM HCL 2 MG/2ML IJ SOLN
1.0000 mg | INTRAMUSCULAR | Status: DC | PRN
  Administered 2015-09-12: 2 mg via INTRAVENOUS

## 2015-09-12 MED ORDER — EPHEDRINE SULFATE 50 MG/ML IJ SOLN
INTRAMUSCULAR | Status: DC | PRN
  Administered 2015-09-12 (×5): 10 mg via INTRAVENOUS

## 2015-09-12 MED ORDER — EPHEDRINE 5 MG/ML INJ
INTRAVENOUS | Status: AC
  Filled 2015-09-12: qty 10

## 2015-09-12 MED ORDER — BUPIVACAINE-EPINEPHRINE (PF) 0.5% -1:200000 IJ SOLN
INTRAMUSCULAR | Status: AC
  Filled 2015-09-12: qty 30

## 2015-09-12 MED ORDER — OXYCODONE HCL 5 MG PO TABS
5.0000 mg | ORAL_TABLET | Freq: Once | ORAL | Status: AC | PRN
  Administered 2015-09-12: 5 mg via ORAL

## 2015-09-12 MED ORDER — BUPIVACAINE HCL (PF) 0.5 % IJ SOLN
INTRAMUSCULAR | Status: AC
  Filled 2015-09-12: qty 30

## 2015-09-12 MED ORDER — BUPIVACAINE-EPINEPHRINE (PF) 0.5% -1:200000 IJ SOLN
INTRAMUSCULAR | Status: DC | PRN
  Administered 2015-09-12: 30 mL

## 2015-09-12 MED ORDER — METHYLENE BLUE 0.5 % INJ SOLN
INTRAVENOUS | Status: AC
  Filled 2015-09-12: qty 10

## 2015-09-12 MED ORDER — METHYLENE BLUE 0.5 % INJ SOLN
INTRAVENOUS | Status: DC | PRN
  Administered 2015-09-12: 2 mL via SUBMUCOSAL

## 2015-09-12 MED ORDER — OXYCODONE HCL 5 MG PO TABS
ORAL_TABLET | ORAL | Status: AC
  Filled 2015-09-12: qty 1

## 2015-09-12 MED ORDER — PHENYLEPHRINE HCL 10 MG/ML IJ SOLN
INTRAMUSCULAR | Status: DC | PRN
  Administered 2015-09-12: 40 ug via INTRAVENOUS

## 2015-09-12 MED ORDER — CEFAZOLIN SODIUM-DEXTROSE 2-4 GM/100ML-% IV SOLN
INTRAVENOUS | Status: AC
  Filled 2015-09-12: qty 100

## 2015-09-12 MED ORDER — HYDROCODONE-ACETAMINOPHEN 5-325 MG PO TABS: 1.0000 | ORAL_TABLET | ORAL | 0 refills | Status: DC | PRN

## 2015-09-12 MED ORDER — LIDOCAINE HCL (CARDIAC) 20 MG/ML IV SOLN
INTRAVENOUS | Status: DC | PRN
  Administered 2015-09-12: 30 mg via INTRAVENOUS

## 2015-09-12 MED ORDER — FENTANYL CITRATE (PF) 100 MCG/2ML IJ SOLN
INTRAMUSCULAR | Status: AC
  Filled 2015-09-12: qty 2

## 2015-09-12 MED ORDER — BUPIVACAINE-EPINEPHRINE 0.5% -1:200000 IJ SOLN
INTRAMUSCULAR | Status: DC | PRN
  Administered 2015-09-12: 20 mL

## 2015-09-12 MED ORDER — SODIUM CHLORIDE 0.9 % IJ SOLN
INTRAMUSCULAR | Status: DC | PRN
  Administered 2015-09-12: 3 mL via INTRAVENOUS

## 2015-09-12 MED ORDER — LACTATED RINGERS IV SOLN: INTRAVENOUS | Status: DC

## 2015-09-12 MED ORDER — PHENYLEPHRINE 40 MCG/ML (10ML) SYRINGE FOR IV PUSH (FOR BLOOD PRESSURE SUPPORT)
PREFILLED_SYRINGE | INTRAVENOUS | Status: AC
  Filled 2015-09-12: qty 10

## 2015-09-12 MED ORDER — LACTATED RINGERS IV SOLN
INTRAVENOUS | Status: DC
  Administered 2015-09-12 (×2): via INTRAVENOUS

## 2015-09-12 MED ORDER — ONDANSETRON HCL 4 MG/2ML IJ SOLN
INTRAMUSCULAR | Status: DC | PRN
  Administered 2015-09-12: 4 mg via INTRAVENOUS

## 2015-09-12 MED ORDER — TECHNETIUM TC 99M SULFUR COLLOID FILTERED
1.0000 | Freq: Once | INTRAVENOUS | Status: AC | PRN
  Administered 2015-09-12: 1 via INTRADERMAL

## 2015-09-12 MED ORDER — HYDROMORPHONE HCL 1 MG/ML IJ SOLN
INTRAMUSCULAR | Status: AC
  Filled 2015-09-12: qty 1

## 2015-09-12 MED ORDER — MEPERIDINE HCL 25 MG/ML IJ SOLN: 6.2500 mg | INTRAMUSCULAR | Status: DC | PRN

## 2015-09-12 MED ORDER — PROMETHAZINE HCL 25 MG/ML IJ SOLN: 6.2500 mg | INTRAMUSCULAR | Status: DC | PRN

## 2015-09-12 MED ORDER — FENTANYL CITRATE (PF) 100 MCG/2ML IJ SOLN
50.0000 ug | INTRAMUSCULAR | Status: AC | PRN
  Administered 2015-09-12: 25 ug via INTRAVENOUS
  Administered 2015-09-12: 50 ug via INTRAVENOUS
  Administered 2015-09-12: 25 ug via INTRAVENOUS
  Administered 2015-09-12: 50 ug via INTRAVENOUS

## 2015-09-12 MED ORDER — GLYCOPYRROLATE 0.2 MG/ML IJ SOLN
0.2000 mg | Freq: Once | INTRAMUSCULAR | Status: DC | PRN
Start: 2015-09-12 — End: 2015-09-12

## 2015-09-12 SURGICAL SUPPLY — 57 items
APL SKNCLS STERI-STRIP NONHPOA (GAUZE/BANDAGES/DRESSINGS) ×1
APPLIER CLIP 11 MED OPEN (CLIP) ×2
APR CLP MED 11 20 MLT OPN (CLIP) ×1
BENZOIN TINCTURE PRP APPL 2/3 (GAUZE/BANDAGES/DRESSINGS) ×2 IMPLANT
BLADE HEX COATED 2.75 (ELECTRODE) ×2 IMPLANT
BLADE SURG 10 STRL SS (BLADE) ×2 IMPLANT
BLADE SURG 15 STRL LF DISP TIS (BLADE) ×2 IMPLANT
BLADE SURG 15 STRL SS (BLADE) ×4
CANISTER SUCT 1200ML W/VALVE (MISCELLANEOUS) ×2 IMPLANT
CHLORAPREP W/TINT 26ML (MISCELLANEOUS) ×2 IMPLANT
CLIP APPLIE 11 MED OPEN (CLIP) ×1 IMPLANT
CLIP TI WIDE RED SMALL 6 (CLIP) IMPLANT
COVER BACK TABLE 60X90IN (DRAPES) ×2 IMPLANT
COVER MAYO STAND STRL (DRAPES) ×2 IMPLANT
COVER PROBE W GEL 5X96 (DRAPES) ×2 IMPLANT
DECANTER SPIKE VIAL GLASS SM (MISCELLANEOUS) IMPLANT
DEVICE DUBIN W/COMP PLATE 8390 (MISCELLANEOUS) ×2 IMPLANT
DRAIN HEMOVAC 1/8 X 5 (WOUND CARE) IMPLANT
DRAPE LAPAROSCOPIC ABDOMINAL (DRAPES) ×2 IMPLANT
DRAPE UTILITY XL STRL (DRAPES) ×2 IMPLANT
ELECT REM PT RETURN 9FT ADLT (ELECTROSURGICAL) ×2
ELECTRODE REM PT RTRN 9FT ADLT (ELECTROSURGICAL) ×1 IMPLANT
EVACUATOR SILICONE 100CC (DRAIN) IMPLANT
GLOVE BIOGEL PI IND STRL 7.0 (GLOVE) ×1 IMPLANT
GLOVE BIOGEL PI INDICATOR 7.0 (GLOVE) ×1
GLOVE ECLIPSE 6.5 STRL STRAW (GLOVE) ×2 IMPLANT
GLOVE SURG ORTHO 8.0 STRL STRW (GLOVE) ×2 IMPLANT
GOWN STRL REUS W/ TWL LRG LVL3 (GOWN DISPOSABLE) ×1 IMPLANT
GOWN STRL REUS W/ TWL XL LVL3 (GOWN DISPOSABLE) ×1 IMPLANT
GOWN STRL REUS W/TWL LRG LVL3 (GOWN DISPOSABLE) ×2
GOWN STRL REUS W/TWL XL LVL3 (GOWN DISPOSABLE) ×2
KIT MARKER MARGIN INK (KITS) IMPLANT
NDL SAFETY ECLIPSE 18X1.5 (NEEDLE) ×1 IMPLANT
NEEDLE HYPO 18GX1.5 SHARP (NEEDLE) ×2
NEEDLE HYPO 25X1 1.5 SAFETY (NEEDLE) ×4 IMPLANT
PACK BASIN DAY SURGERY FS (CUSTOM PROCEDURE TRAY) ×2 IMPLANT
PAD ALCOHOL SWAB (MISCELLANEOUS) ×2 IMPLANT
PENCIL BUTTON HOLSTER BLD 10FT (ELECTRODE) ×2 IMPLANT
PIN SAFETY STERILE (MISCELLANEOUS) IMPLANT
SLEEVE SCD COMPRESS KNEE MED (MISCELLANEOUS) IMPLANT
SPONGE LAP 18X18 X RAY DECT (DISPOSABLE) ×2 IMPLANT
STAPLER VISISTAT 35W (STAPLE) IMPLANT
STRIP CLOSURE SKIN 1/2X4 (GAUZE/BANDAGES/DRESSINGS) ×2 IMPLANT
SUT ETHILON 3 0 FSL (SUTURE) IMPLANT
SUT MNCRL AB 3-0 PS2 18 (SUTURE) IMPLANT
SUT MNCRL AB 4-0 PS2 18 (SUTURE) ×4 IMPLANT
SUT SILK 2 0 FS (SUTURE) ×2 IMPLANT
SUT VIC AB 2-0 SH 27 (SUTURE)
SUT VIC AB 2-0 SH 27XBRD (SUTURE) IMPLANT
SUT VIC AB 3-0 SH 27 (SUTURE)
SUT VIC AB 3-0 SH 27X BRD (SUTURE) IMPLANT
SUT VICRYL 3-0 CR8 SH (SUTURE) IMPLANT
SYR CONTROL 10ML LL (SYRINGE) ×4 IMPLANT
TOWEL OR 17X24 6PK STRL BLUE (TOWEL DISPOSABLE) ×4 IMPLANT
TOWEL OR NON WOVEN STRL DISP B (DISPOSABLE) ×2 IMPLANT
TUBE CONNECTING 20X1/4 (TUBING) ×2 IMPLANT
YANKAUER SUCT BULB TIP NO VENT (SUCTIONS) ×2 IMPLANT

## 2015-09-12 NOTE — Progress Notes (Signed)
Assisted Dr. Hollis with left, ultrasound guided, pectoralis block. Side rails up, monitors on throughout procedure. See vital signs in flow sheet. Tolerated Procedure well. 

## 2015-09-12 NOTE — Op Note (Deleted)
  The note originally documented on this encounter has been moved the the encounter in which it belongs.  

## 2015-09-12 NOTE — Discharge Instructions (Signed)
°  Post Anesthesia Home Care Instructions ° °Activity: °Get plenty of rest for the remainder of the day. A responsible adult should stay with you for 24 hours following the procedure.  °For the next 24 hours, DO NOT: °-Drive a car °-Operate machinery °-Drink alcoholic beverages °-Take any medication unless instructed by your physician °-Make any legal decisions or sign important papers. ° °Meals: °Start with liquid foods such as gelatin or soup. Progress to regular foods as tolerated. Avoid greasy, spicy, heavy foods. If nausea and/or vomiting occur, drink only clear liquids until the nausea and/or vomiting subsides. Call your physician if vomiting continues. ° °Special Instructions/Symptoms: °Your throat Debellis feel dry or sore from the anesthesia or the breathing tube placed in your throat during surgery. If this causes discomfort, gargle with warm salt water. The discomfort should disappear within 24 hours. ° °If you had a scopolamine patch placed behind your ear for the management of post- operative nausea and/or vomiting: ° °1. The medication in the patch is effective for 72 hours, after which it should be removed.  Wrap patch in a tissue and discard in the trash. Wash hands thoroughly with soap and water. °2. You Ravert remove the patch earlier than 72 hours if you experience unpleasant side effects which [place] include dry mouth, dizziness or visual disturbances. °3. Avoid touching the patch. Wash your hands with soap and water after contact with the patch. °  ° °

## 2015-09-12 NOTE — Brief Op Note (Signed)
09/12/2015  10:03 AM  PATIENT:  Dalene Seltzer A Bramblett  65 y.o. female  PRE-OPERATIVE DIAGNOSIS:  LEFT BREAST CANCER  POST-OPERATIVE DIAGNOSIS:  LEFT BREAST CANCER  PROCEDURE:  Procedure(s): RE-EXCISION OF LEFT BREAST LUMPECTOMY WITH LEFT AXILLARY LYMPH NODE BIOPSY (Left)  SURGEON:  Surgeon(s) and Role:    * [name], MD - Primary  ANESTHESIA:   general  EBL:  Total I/O In: 1000 [I.V.:1000] Out: -   BLOOD ADMINISTERED:none  DRAINS: none   LOCAL MEDICATIONS USED:  MARCAINE     SPECIMEN:  Excision  DISPOSITION OF SPECIMEN:  PATHOLOGY  COUNTS:  YES  TOURNIQUET:  * No tourniquets in log *  DICTATION: .Other Dictation: Dictation Number [id number]  PLAN OF CARE: Discharge to home after PACU  PATIENT DISPOSITION:  PACU - hemodynamically stable.   Delay start of Pharmacological VTE agent (>24hrs) due to surgical blood loss or risk of bleeding: yes  Earnstine Regal, MD, [facility] Office: [phone]

## 2015-09-12 NOTE — Anesthesia Postprocedure Evaluation (Signed)
Anesthesia Post Note  Patient: [name] [name]  Procedure(s) Performed: Procedure(s) (LRB): RE-EXCISION OF LEFT BREAST LUMPECTOMY WITH LEFT AXILLARY LYMPH NODE BIOPSY (Left)  Patient location during evaluation: PACU Anesthesia Type: General and Regional Level of consciousness: awake and alert Pain management: pain level controlled Vital Signs Assessment: post-procedure vital signs reviewed and stable Respiratory status: spontaneous breathing, nonlabored ventilation, respiratory function stable and patient connected to nasal cannula oxygen Cardiovascular status: blood pressure returned to baseline and stable Postop Assessment: no signs of nausea or vomiting Anesthetic complications: no    Last Vitals:  Vitals:   09/12/15 1105 09/12/15 1119  BP:  (!) 151/71  Pulse: (!) 104 (!) 104  Resp: (!) 24 16  Temp:  36.6 C    Last Pain:  Vitals:   09/12/15 1119  TempSrc: Oral  PainSc: 3                  Effie Berkshire

## 2015-09-12 NOTE — Anesthesia Procedure Notes (Signed)
Procedure Name: LMA Insertion Date/Time: 09/12/2015 8:40 AM Performed by: Layney Gillson D Pre-anesthesia Checklist: Patient identified, Emergency Drugs available, Suction available and Patient being monitored Patient Re-evaluated:Patient Re-evaluated prior to inductionOxygen Delivery Method: Circle system utilized Preoxygenation: Pre-oxygenation with 100% oxygen Intubation Type: IV induction Ventilation: Mask ventilation without difficulty LMA: LMA inserted LMA Size: 4.0 Number of attempts: 1 Airway Equipment and Method: Bite block Placement Confirmation: positive ETCO2 Tube secured with: Tape Dental Injury: Teeth and Oropharynx as per pre-operative assessment

## 2015-09-12 NOTE — H&P (Signed)
General Surgery [facility]  [name] [name] DOB: 1950-08-12 Married / Language: English / Race: White Female  History of Present Illness  Patient is referred by Dr. Antony Blackbird and [place] for evaluation of left nipple discharge. Patient underwent wire loc excisional biopsy 08/03/2015.  Invasive carcinoma found with focally positive superior margin.  Now returns to OR for re-excision of superior margin and sentinel lymph node biopsy of left axilla.  Other Problems Anxiety Disorder Arthritis Diabetes Mellitus High blood pressure Sleep Apnea Thyroid Disease  Past Surgical History Gallbladder Surgery - Open Knee Surgery Bilateral.  Diagnostic Studies History Colonoscopy 1-5 years ago Mammogram within last year Pap Smear >5 years ago  Allergies Codeine Sulfate *ANALGESICS - OPIOID*  Medication History Methocarbamol (500MG  Tablet, Oral) Active. Allopurinol (100MG  Tablet, Oral) Active. Levothyroxine Sodium (175MCG Tablet, Oral) Active. Gabapentin (600MG  Tablet, Oral) Active. Victoza (18MG /3ML Solution, Subcutaneous) Active. Loratadine (10MG  Tablet, Oral) Active. Furosemide (20MG  Tablet, Oral) Active. Citalopram Hydrobromide (20MG  Tablet, Oral) Active. Medications Reconciled  Social History  No alcohol use No caffeine use No drug use Tobacco use Never smoker.  Family History Arthritis Father. Diabetes Mellitus Father. Hypertension Mother. Thyroid problems Father.  Pregnancy / Birth History Age at menarche 70 years. Age of menopause 73-50 Gravida 63 Maternal age 53-25 Para 3  Review of Systems General Present- Fatigue and Weight Gain. Not Present- Appetite Loss, Chills, Fever, Night Sweats and Weight Loss. Skin Present- Dryness. Not Present- Change in Wart/Mole, Hives, Jaundice, New Lesions, Non-Healing Wounds, Rash and Ulcer. HEENT Present- Ringing in the Ears and Wears  glasses/contact lenses. Not Present- Earache, Hearing Loss, Hoarseness, Nose Bleed, Oral Ulcers, Seasonal Allergies, Sinus Pain, Sore Throat, Visual Disturbances and Yellow Eyes. Respiratory Present- Snoring. Not Present- Bloody sputum, Chronic Cough, Difficulty Breathing and Wheezing. Breast Present- Skin Changes. Not Present- Breast Mass, Breast Pain and Nipple Discharge. Cardiovascular Present- Leg Cramps and Swelling of Extremities. Not Present- Chest Pain, Difficulty Breathing Lying Down, Palpitations, Rapid Heart Rate and Shortness of Breath. Gastrointestinal Not Present- Abdominal Pain, Bloating, Bloody Stool, Change in Bowel Habits, Chronic diarrhea, Constipation, Difficulty Swallowing, Excessive gas, Gets full quickly at meals, Hemorrhoids, Indigestion, Nausea, Rectal Pain and Vomiting. Female Genitourinary Not Present- Frequency, Nocturia, Painful Urination, Pelvic Pain and Urgency. Musculoskeletal Present- Joint Pain, Joint Stiffness and Swelling of Extremities. Not Present- Back Pain, Muscle Pain and Muscle Weakness. Neurological Present- Tingling and Trouble walking. Not Present- Decreased Memory, Fainting, Headaches, Numbness, Seizures, Tremor and Weakness. Psychiatric Present- Anxiety. Not Present- Bipolar, Change in Sleep Pattern, Depression, Fearful and Frequent crying. Endocrine Present- Hair Changes. Not Present- Cold Intolerance, Excessive Hunger, Heat Intolerance, Hot flashes and New Diabetes.  Vitals Weight: 259 lb Height: 61in Body Surface Area: 2.11 m Body Mass Index: 48.94 kg/m  Temp.: 69F  Pulse: 84 (Regular)  BP: 128/78 (Sitting, Left Arm, Standard)  Physical Exam  General - appears comfortable, no distress; not diaphorectic  HEENT - normocephalic; sclerae clear, gaze conjugate; mucous membranes moist, dentition fair; voice normal  Neck - symmetric on extension; no palpable anterior or posterior cervical adenopathy; no palpable masses in the  thyroid bed  Chest - clear bilaterally without rhonchi, rales, or wheeze  Cor - regular rhythm with normal rate; no significant murmur  Breast - right breast nipple areolar complex appears normal. Right breast parenchyma is soft and uniform without discrete or dominant mass. Left breast nipple does show a area at the superior and medial portion of the nipple which has mild  violaceous discoloration and what appears to be a small orifice into the duct. There is no drainage at this time. No palpable masses beneath the areola or in the surrounding breast tissue. No axillary adenopathy on the left.  Ext - non-tender without significant edema or lymphedema; healing surgical wounds bilateral knees  Neuro - grossly intact; no tremor   Assessment & Plan  Left breast carcinoma  Plan re-excision of superior margin of left breast operative site.  Plan left axillary sentinel lymph node biopsy.  The risks and benefits of the procedure have been discussed at length with the patient.  The patient understands the proposed procedure, potential alternative treatments, and the course of recovery to be expected.  All of the patient's questions have been answered at this time.  The patient wishes to proceed with surgery.  Earnstine Regal, MD, [facility] Office: [phone]

## 2015-09-12 NOTE — Transfer of Care (Signed)
Immediate Anesthesia Transfer of Care Note  Patient: [name] [name]  Procedure(s) Performed: Procedure(s): RE-EXCISION OF LEFT BREAST LUMPECTOMY WITH LEFT AXILLARY LYMPH NODE BIOPSY (Left)  Patient Location: PACU  Anesthesia Type:GA combined with regional for post-op pain  Level of Consciousness: awake, alert , oriented and patient cooperative  Airway & Oxygen Therapy: Patient Spontanous Breathing and Patient connected to face mask oxygen  Post-op Assessment: Report given to RN and Post -op Vital signs reviewed and stable  Post vital signs: Reviewed and stable  Last Vitals:  Vitals:   09/12/15 0800 09/12/15 0815  BP:  (!) 146/87  Pulse: 89 98  Resp: 16 (!) 0  Temp:      Last Pain:  Vitals:   09/12/15 0731  TempSrc: Oral         Complications: No apparent anesthesia complications

## 2015-09-12 NOTE — Anesthesia Preprocedure Evaluation (Addendum)
Anesthesia Evaluation  Patient identified by MRN, date of birth, ID band Patient awake    Reviewed: Allergy & Precautions, NPO status , Patient's Chart, lab work & pertinent test results  Airway Mallampati: II  TM Distance: >3 FB Neck ROM: Full    Dental  (+) Teeth Intact, Dental Advisory Given   Pulmonary sleep apnea and Continuous Positive Airway Pressure Ventilation ,    breath sounds clear to auscultation       Cardiovascular hypertension,  Rhythm:Regular Rate:Normal     Neuro/Psych  Headaches, PSYCHIATRIC DISORDERS Anxiety  Neuromuscular disease    GI/Hepatic negative GI ROS, Neg liver ROS,   Endo/Other  diabetes  Renal/GU negative Renal ROS  negative genitourinary   Musculoskeletal  (+) Arthritis ,   Abdominal (+) + obese,   Peds negative pediatric ROS (+)  Hematology   Anesthesia Other Findings   Reproductive/Obstetrics negative OB ROS                           Anesthesia Physical Anesthesia Plan  ASA: III  Anesthesia Plan: General   Post-op Pain Management: GA combined w/ Regional for post-op pain   Induction: Intravenous  Airway Management Planned: LMA  Additional Equipment:   Intra-op Plan:   Post-operative Plan: Extubation in OR  Informed Consent: I have reviewed the patients History and Physical, chart, labs and discussed the procedure including the risks, benefits and alternatives for the proposed anesthesia with the patient or authorized representative who has indicated his/her understanding and acceptance.   Dental advisory given  Plan Discussed with: CRNA  Anesthesia Plan Comments:         Anesthesia Quick Evaluation

## 2015-09-12 NOTE — Anesthesia Procedure Notes (Signed)
Anesthesia Regional Block:  Pectoralis block  Pre-Anesthetic Checklist: ,, timeout performed, Correct Patient, Correct Site, Correct Laterality, Correct Procedure, Correct Position, site marked, Risks and benefits discussed,  Surgical consent,  Pre-op evaluation,  At surgeon's request and post-op pain management  Laterality: Left  Prep: chloraprep       Needles:  Injection technique: Single-shot  Needle Type: Echogenic Needle     Needle Length: 9cm 9 cm Needle Gauge: 21 and 21 G    Additional Needles:  Procedures: ultrasound guided (picture in chart) Pectoralis block Narrative:  Start time: 09/12/2015 8:00 AM End time: 09/12/2015 8:05 AM Injection made incrementally with aspirations every 5 mL.  Performed by: Personally  Anesthesiologist: Suella Broad D  Additional Notes: Pt tolerated well. No immediate complications noted.

## 2015-09-12 NOTE — Op Note (Addendum)
NAMEJALEEN, SCHLADER NO.:  [id number]  MEDICAL RECORD NO.:  [id number]  LOCATION:                               FACILITY:  [facility]  PHYSICIAN:  Earnstine Regal, MD      DATE OF BIRTH:  August 23, 1950  DATE OF PROCEDURE:  09/12/2015                              OPERATIVE REPORT   PREOPERATIVE DIAGNOSIS:  Left breast carcinoma.  POSTOPERATIVE DIAGNOSIS:  Left breast carcinoma.  PROCEDURE: 1. Reexcision superior margin, left breast lumpectomy site. 2. Injection of methylene blue dye, left breast. 3. Left axillary sentinel lymph node biopsy.  SURGEON:  Earnstine Regal, MD, FACS  ANESTHESIA:  General.  ESTIMATED BLOOD LOSS:  Minimal.  PREPARATION:  ChloraPrep.  COMPLICATIONS:  None.  INDICATIONS:  The patient is a 65 year old female who underwent previous excisional biopsy of a lesion in the upper left breast.  Final pathology showed invasive ductal carcinoma measuring 1.2 cm in dimension.  There was a focal positive margin at the superior aspect of the lumpectomy specimen.  The patient now returns to the operating room for re-excision of the superior margin and left axillary sentinel lymph node biopsy.  BODY OF REPORT:  Procedure was done in OR #1 at the [facility].  The patient was brought to the operating room, placed in a supine position on the operating room table.  Following administration of general anesthesia, the patient was positioned and then prepped and draped in the usual aseptic fashion.  After ascertaining that an adequate level of anesthesia had been achieved, the left breast circumareolar incision was reopened with a #15 blade.  Dissection was carried into the subcutaneous tissues and hemostasis achieved with electrocautery.  The previous lumpectomy site was opened and serous fluid was evacuated.  Using the electrocautery for hemostasis, the entire superior rim of the previous lumpectomy site was excised for several cm.  The  specimen was removed and it was oriented with silk sutures.  It is submitted in its entirety to Pathology for review.  Good hemostasis was achieved in the lumpectomy cavity.  Wound was irrigated.  Margins of dissection were marked with medium Ligaclips. Subcutaneous tissues were closed with interrupted 3-0 Vicryl sutures. Skin was anesthetized with local anesthetic.  Skin was closed with a running 4-0 Monocryl subcuticular suture.  Dermabond was placed as dressing.  Next, using the Neoprobe to localize the sentinel lymph node in the lower left axilla, an incision was made in the left lower axilla with a #15 blade.  Dissection was carried down through subcutaneous tissues and hemostasis achieved with the electrocautery.  Using the Neoprobe for direction, dissection was carried down to a single large radioactive lymph node.  This does not appear to be blue in color.  The tissue was excised in its entirety with surrounding tissue.  Scanning of the axilla after excision shows no residual radioactivity.  The large lymph node was dissected away from the tissue and submitted separately as sentinel lymph node #1.  Remainder of the axillary content was not radioactive and was submitted as additional axillary content.  Left axilla was irrigated with warm saline.  Good hemostasis was achieved.  Subcutaneous tissues were closed with interrupted 3-0 Vicryl sutures.  Skin was closed with a running 4-0 Monocryl subcuticular suture.  Local anesthetic was infiltrated circumferentially.  Dermabond was placed as dressing.  The patient was awakened from anesthesia and brought to the recovery room.  The patient tolerated the procedure well.   Earnstine Regal, MD, [facility] Office: [phone]    TMG/MEDQ  D:  09/12/2015  T:  09/12/2015  Job:  [id number]  cc:   Chauncey Cruel, M.D.

## 2015-09-15 ENCOUNTER — Encounter (HOSPITAL_BASED_OUTPATIENT_CLINIC_OR_DEPARTMENT_OTHER): Payer: Self-pay | Admitting: Surgery

## 2015-09-16 ENCOUNTER — Encounter: Payer: Medicare Other | Admitting: Nutrition

## 2015-09-18 ENCOUNTER — Encounter: Payer: Self-pay | Admitting: *Deleted

## 2015-09-18 NOTE — Progress Notes (Signed)
[place] Psychosocial Distress Screening Clinical Social Work  Clinical Social Work was referred by distress screening protocol.  The patient scored a 6 on the Psychosocial Distress Thermometer which indicates moderate distress. Clinical Social Worker phoned pt at home to assess for distress and other psychosocial needs. CSW spoke with pt at length via phone. She reports her grown children act "very immature". She is caregiver for her aunt also has cancer and just "graduated from hospice". Pt reports to cope via reading. CSW provided supportive listening and education on various coping options. Pt very interested in groups. CSW to mail calendar and add to the mailing list. Pt aware to reach out as needed and to follow up with CSW team when she starts radiation.   ONCBCN DISTRESS SCREENING 08/28/2015  Screening Type Initial Screening  Distress experienced in past week (1-10) 6  Family Problem type Children  Emotional problem type Nervousness/Anxiety;Adjusting to illness  Spiritual/Religous concerns type Relating to God  Information Concerns Type Lack of info about treatment  Physical Problem type Tingling hands/feet;Sexual problems;Skin dry/itchy;Swollen arms/legs  Other     Clinical Social Worker follow up needed: Yes.    If yes, follow up plan: See above Loren Racer, [place] Worker [place]  [facility] Phone: [phone] Fax: [phone]

## 2015-09-29 NOTE — Progress Notes (Addendum)
Location of Breast Cancer: left breast  Histology per Pathology Report:   09/12/15 Diagnosis 1. Breast, excision, Left SMALL FOCUS OF DUCTAL CARCINOMA IN SITU, PREVIOUS RESECTION SITE CHANGES ALL MARGINS ARE NEGATIVE FOR ATYPIA OR MALIGNANCY 2. Lymph node, biopsy, Left Axillary #1 ONE LYMPH NODE, NEGATIVE FOR CARCINOMA (0/1) 3. Lymph nodes, regional resection, Left additional Axillary Contents FOUR LYMPH NODES, NEGATIVE FOR CARCINOMA (0/4)  08/04/15 Diagnosis Breast, lumpectomy, Left - INVASIVE AND IN SITU DUCTAL CARCINOMA, 1.2 CM. - TUMOR FOCALLY INVOLVES SUPERIOR MARGIN. - FIBROCYSTIC CHANGES AND PREVIOUS BIOPSY SITE. - SEE ONCOLOGY TABLE.  06/24/15 Diagnosis Breast, left, needle core biopsy, 12:30 o'clock - FIBROCYSTIC CHANGES WITH FEATURES OF RUPTURE. - THERE IS NO EVIDENCE OF MALIGNANCY. - SEE COMMENT.  Receptor Status: ER(95%), PR (95%), Her2-neu (negative), Ki-(10%)  Did patient present with symptoms (if so, please note symptoms) or was this found on screening mammography?: patient has had intermittent drainage from the left nipple approximately 1 year ago.   Past/Anticipated interventions by surgeon, if any: 08/04/15 - Procedure: LEFT EXCISION DUCTAL SYSTEM BREAST;  Surgeon: [name], MD, Procedure: LEFT BREAST BIOPSY WITH NEEDLE LOCALIZATION;  Surgeon: [name], MD  Past/Anticipated interventions by medical oncology, if any: antiestrogen therapy after radiation  Lymphedema issues, if any:  no    Pain issues, if any:  no  Ob Gyn history;  Patient was 13 years with first menses, she was 80 years with birth of first child, she has 3 children, she has not used hormone replacement.  SAFETY ISSUES:  Prior radiation? no  Pacemaker/ICD? no  Possible current pregnancy?no  Is the patient on methotrexate? no  Current Complaints / other details:  Patient has some soreness in her left breast and underarm from her last surgery.  She is here with her husband and  aunt.  BP 137/67 (BP Location: Right Arm, Patient Position: Sitting)   Pulse 79   Temp 98.1 F (36.7 C) (Oral)   Ht 5' 1" (1.549 m)   Wt 274 lb 3.2 oz (124.4 kg)   SpO2 98%   BMI 51.81 kg/m    Wt Readings from Last 3 Encounters:  10/01/15 274 lb 3.2 oz (124.4 kg)  09/12/15 267 lb 12.8 oz (121.5 kg)  08/28/15 267 lb 3.2 oz (121.2 kg)

## 2015-10-01 ENCOUNTER — Ambulatory Visit
Admission: RE | Admit: 2015-10-01 | Discharge: 2015-10-01 | Disposition: A | Discharge status: Home / Self Care | Payer: Medicare Other | Source: Ambulatory Visit | Attending: Radiation Oncology | Admitting: Radiation Oncology

## 2015-10-01 ENCOUNTER — Encounter: Payer: Self-pay | Admitting: Radiation Oncology

## 2015-10-01 DIAGNOSIS — I10 Essential (primary) hypertension: Secondary | ICD-10-CM | POA: Diagnosis not present

## 2015-10-01 DIAGNOSIS — C50412 Malignant neoplasm of upper-outer quadrant of left female breast: Secondary | ICD-10-CM

## 2015-10-01 DIAGNOSIS — Z510 Encounter for antineoplastic radiation therapy: Secondary | ICD-10-CM | POA: Diagnosis not present

## 2015-10-01 DIAGNOSIS — Z170 Estrogen receptor positive status [ER+]: Secondary | ICD-10-CM | POA: Diagnosis not present

## 2015-10-01 DIAGNOSIS — Z79899 Other long term (current) drug therapy: Secondary | ICD-10-CM | POA: Diagnosis not present

## 2015-10-01 DIAGNOSIS — Z9889 Other specified postprocedural states: Secondary | ICD-10-CM | POA: Diagnosis not present

## 2015-10-01 DIAGNOSIS — E1140 Type 2 diabetes mellitus with diabetic neuropathy, unspecified: Secondary | ICD-10-CM | POA: Diagnosis not present

## 2015-10-01 NOTE — Progress Notes (Signed)
Radiation Oncology         [phone]) [phone] ________________________________  Name: [name] [name] MRN: [id number]  Date: 10/01/2015  DOB: 07/15/1950  Re-Evaluation Visit Note  CC: Antony Blackbird, MD  [name], MD    ICD-9-CM ICD-10-CM   1. Breast cancer of upper-outer quadrant of left female breast ([place]) 174.4 C50.412     Diagnosis: Stage IA (pT1c, pN0) invasive ductal carcinoma of the left breast (ER/PR +, HER2 -)  Interval Since Last Radiation: N/A  Narrative:  The patient returns today for a re-evaluation. The patient's initial consultation with radiation oncology was on 08/28/15. The patient had a left lumpectomy on 08/04/15 and due to focal involvement of the superior margin, she had a re-excision on 09/12/15 revealing a small focus of DCIS, previous resection site changes, and all margins were negative for atypia or malignancy. The patient also had a regional resection of the left axilla and 5 biopsied lymph nodes were negative for carcinoma. Oncotype Dx score, from the tissue sample excised from 08/04/15, was 12 (low risk) and chemotherapy was not recommended.  On review of systems, the patient has soreness in her left breast and left axilla from her recent surgery. She is accompanied by her husband and aunt.  ALLERGIES:  is allergic to influenza a (h1n1) monoval pf and codeine.  Meds: Current Outpatient Prescriptions  Medication Sig Dispense Refill  . allopurinol (ZYLOPRIM) 100 MG tablet Take 100 mg by mouth daily.    . citalopram (CELEXA) 20 MG tablet Take 20 mg by mouth daily.    . furosemide (LASIX) 20 MG tablet Take 20 mg by mouth daily as needed. For fluid/swelling    . gabapentin (NEURONTIN) 600 MG tablet Take 600-1,200 mg by mouth 3 (three) times daily. Take 1 in the morning, 1 in the afternoon and 2 at bedtime.    [name] ibuprofen (ADVIL,MOTRIN) 200 MG tablet Take 200 mg by mouth every 6 (six) hours as needed.    [name] levothyroxine (SYNTHROID, LEVOTHROID) 175 MCG tablet Take 1 tablet  by mouth daily.  0  . Liraglutide (VICTOZA) 18 MG/3ML SOPN Inject 1.8 mg into the skin daily.    [name] loratadine (CLARITIN) 10 MG tablet Take 10 mg by mouth daily.    [name] losartan (COZAAR) 50 MG tablet Take 1 tablet by mouth every morning.     . methocarbamol (ROBAXIN) 500 MG tablet Take 500 mg by mouth 4 (four) times daily.     No current facility-administered medications for this encounter.     Physical Findings: The patient is in no acute distress. Patient is alert and oriented.  height is _0  (1.549 m) and weight is 274 lb 3.2 oz (124.4 kg). Her oral temperature is 98.1 F (36.7 C). Her blood pressure is 137/67 and her pulse is 79. Her oxygen saturation is 98%.   Lungs are clear to auscultation bilaterally. Heart has regular rate and rhythm. No palpable cervical, supraclavicular, or axillary adenopathy. Well healing periareolar scar in the approximate 12-1 o'clock position of the left breast. Separate scar in the left axillary region from her sentinel node procedure.  Lab Findings: Lab Results  Component Value Date   WBC 8.5 03/01/2015   HGB 9.5 (L) 03/02/2015   HCT 29.8 (L) 03/02/2015   MCV 87.7 03/01/2015   PLT 101 (L) 03/01/2015    Radiographic Findings: Nm Sentinel Node Inj-no Rpt (breast)  Result Date: 09/12/2015 CLINICAL DATA: breast cancer - evaluate sentinel lymph node left axilla Sulfur colloid was injected  intradermally by the nuclear medicine technologist for breast cancer sentinel node localization.    Impression:  Stage IA (pT1c, pN0) invasive ductal carcinoma of the left breast (ER/PR +, HER2 -)  The patient is a good candidate for breast conservation radiotherapy.  Plan: I spoke to the patient today regarding her diagnosis and options for treatment. We discussed the equivalence in terms of survival and local failure between mastectomy and breast conservation. We discussed the role of radiation in decreasing local failures in patients who undergo lumpectomy. We  discussed the process of simulation and the placement tattoos. We discussed 6 weeks of treatment as an outpatient. We discussed the possibility of asymptomatic lung damage. We discussed the low likelihood of secondary malignancies. We discussed the possible side effects including but not limited to skin redness, fatigue, permanent skin darkening, and breast swelling.  We discussed the use of cardiac sparing with deep inspiration breath hold if needed.  The patient signed a consent form and this was placed in her medical chart. CT simulation is scheduled on 10/14/15 at 10AM.  ____________________________________ -----------------------------------  Blair Promise, PhD, MD  This document serves as a record of services personally performed by Gery Pray, MD. It was created on his behalf by Darcus Austin, a trained medical scribe. The creation of this record is based on the scribe's personal observations and the provider's statements to them. This document has been checked and approved by the attending provider.

## 2015-10-01 NOTE — Progress Notes (Signed)
Please see the Nurse Progress Note in the MD Initial Consult Encounter for this patient. 

## 2015-10-03 ENCOUNTER — Encounter: Payer: Self-pay | Admitting: *Deleted

## 2015-10-03 NOTE — Progress Notes (Signed)
[place] Work  Clinical Social Work was referred by nurse for assessment of psychosocial needs.  Clinical Social Worker missed pt at her appt and called her to check in. Pt doing well and would like to check in at [facility]. Pt reports her biggest concern is financial and caregiving for her aunt. CSW will follow and encouraged pt to review guidelines for [place] for possible assistance with medical bills. CSW making referral to financial counselor as well.   Clinical Social Work interventions: Supportive Psychiatric nurse education and referral  Loren Racer, [place] Worker [place]  [facility] Phone: [phone] Fax: [phone]

## 2015-10-08 DIAGNOSIS — M1711 Unilateral primary osteoarthritis, right knee: Secondary | ICD-10-CM | POA: Diagnosis not present

## 2015-10-10 DIAGNOSIS — A084 Viral intestinal infection, unspecified: Secondary | ICD-10-CM | POA: Diagnosis not present

## 2015-10-14 ENCOUNTER — Encounter: Payer: Self-pay | Admitting: Radiation Oncology

## 2015-10-14 ENCOUNTER — Ambulatory Visit
Admission: RE | Admit: 2015-10-14 | Discharge: 2015-10-14 | Disposition: A | Discharge status: Home / Self Care | Payer: Medicare Other | Source: Ambulatory Visit | Attending: Radiation Oncology | Admitting: Radiation Oncology

## 2015-10-14 DIAGNOSIS — M1990 Unspecified osteoarthritis, unspecified site: Secondary | ICD-10-CM | POA: Diagnosis not present

## 2015-10-14 DIAGNOSIS — C50412 Malignant neoplasm of upper-outer quadrant of left female breast: Secondary | ICD-10-CM

## 2015-10-14 DIAGNOSIS — E079 Disorder of thyroid, unspecified: Secondary | ICD-10-CM | POA: Diagnosis not present

## 2015-10-14 DIAGNOSIS — Z885 Allergy status to narcotic agent status: Secondary | ICD-10-CM | POA: Diagnosis not present

## 2015-10-14 DIAGNOSIS — F419 Anxiety disorder, unspecified: Secondary | ICD-10-CM | POA: Diagnosis not present

## 2015-10-14 DIAGNOSIS — Z170 Estrogen receptor positive status [ER+]: Secondary | ICD-10-CM | POA: Diagnosis not present

## 2015-10-14 DIAGNOSIS — G709 Myoneural disorder, unspecified: Secondary | ICD-10-CM | POA: Diagnosis not present

## 2015-10-14 DIAGNOSIS — Z510 Encounter for antineoplastic radiation therapy: Secondary | ICD-10-CM | POA: Diagnosis not present

## 2015-10-14 DIAGNOSIS — G4730 Sleep apnea, unspecified: Secondary | ICD-10-CM | POA: Diagnosis not present

## 2015-10-14 DIAGNOSIS — M109 Gout, unspecified: Secondary | ICD-10-CM | POA: Diagnosis not present

## 2015-10-14 DIAGNOSIS — Z79899 Other long term (current) drug therapy: Secondary | ICD-10-CM | POA: Diagnosis not present

## 2015-10-14 DIAGNOSIS — E1140 Type 2 diabetes mellitus with diabetic neuropathy, unspecified: Secondary | ICD-10-CM | POA: Diagnosis not present

## 2015-10-14 DIAGNOSIS — I10 Essential (primary) hypertension: Secondary | ICD-10-CM | POA: Diagnosis not present

## 2015-10-14 NOTE — Progress Notes (Signed)
Financial Counselor--spoke with patient today-set her up for [employer] transportation grant and talked to her about the 1,000.00 [employer] grant--she will bring in income verification for [place] to evaluate for grants

## 2015-10-15 NOTE — Progress Notes (Signed)
Radiation Oncology         [phone]) [phone] ________________________________  Name: [name] [name] MRN: [id number]  Date: 10/14/2015  DOB: 1950/03/11  SIMULATION AND TREATMENT PLANNING NOTE    ICD-9-CM ICD-10-CM   1. Breast cancer of upper-outer quadrant of left female breast ([place]) 174.4 C50.412     DIAGNOSIS:   Stage IA (pT1c, pN0) invasive ductal carcinoma of the left breast (ER/PR +, HER2 -)  NARRATIVE:  The patient was brought to the [facility].  Identity was confirmed.  All relevant records and images related to the planned course of therapy were reviewed.  The patient freely provided informed written consent to proceed with treatment after reviewing the details related to the planned course of therapy. The consent form was witnessed and verified by the simulation staff.  Then, the patient was set-up in a stable reproducible  supine position for radiation therapy.  CT images were obtained.  Surface markings were placed.  The CT images were loaded into the planning software.  Then the target and avoidance structures were contoured.  Treatment planning then occurred.  The radiation prescription was entered and confirmed.  Then, I designed and supervised the construction of a total of 3 medically necessary complex treatment devices.  I have requested : 3D Simulation  I have requested a DVH of the following structures: Heart, lungs, lumpectomy cavity.  I have ordered:dose calc.  PLAN:  The patient will receive 50.4 Gy in 28 fractions followed by a boost to the lumpectomy cavity of 10 gray and a cumulative dose of 60.4 gray.     Radiation Oncology         [phone]) [phone] ________________________________  Name: [name] [name] MRN: [id number]  Date: 10/14/2015  DOB: Nov 02, 1950      Special treatment procedure was performed today due to the extra time and effort required by myself to plan and prepare this patient for deep inspiration breath hold technique.  I have determined cardiac  sparing to be of benefit to this patient to prevent long term cardiac damage due to radiation of the heart.  Bellows were placed on the patient's abdomen. To facilitate cardiac sparing, the patient was coached by the radiation therapists on breath hold techniques and breathing practice was performed. Practice waveforms were obtained. The patient was then scanned while maintaining breath hold in the treatment position.  This image was then transferred over to the imaging specialist. The imaging specialist then created a fusion of the free breathing and breath hold scans using the chest wall as the stable structure. I personally reviewed the fusion in axial, coronal and sagittal image planes.  Excellent cardiac sparing was obtained.  I felt the patient is an appropriate candidate for breath hold and the patient will be treated as such.  The image fusion was then reviewed with the patient to reinforce the necessity of reproducible breath hold.   Optical Surface Tracking Plan:  Since intensity modulated radiotherapy (IMRT) and 3D conformal radiation treatment methods are predicated on accurate and precise positioning for treatment, intrafraction motion monitoring is medically necessary to ensure accurate and safe treatment delivery.  The ability to quantify intrafraction motion without excessive ionizing radiation dose can only be performed with optical surface tracking. Accordingly, surface imaging offers the opportunity to obtain 3D measurements of patient position throughout IMRT and 3D treatments without excessive radiation exposure.  I am ordering optical surface tracking for this patient's upcoming course of radiotherapy. ________________________________  Gery Pray, MD 10/15/2015 7:54  AM    Reference:   Ursula Alert, J, et al. Surface imaging-based analysis of intrafraction motion for breast radiotherapy patients.Journal of [place], n. 6, nov. 2014.  ISSN [id number].   Available at: <http://www.jacmp.org/index.php/jacmp/article/view/4957>.    -----------------------------------  Blair Promise, PhD, MD

## 2015-10-16 DIAGNOSIS — Z79899 Other long term (current) drug therapy: Secondary | ICD-10-CM | POA: Diagnosis not present

## 2015-10-16 DIAGNOSIS — I10 Essential (primary) hypertension: Secondary | ICD-10-CM | POA: Diagnosis not present

## 2015-10-16 DIAGNOSIS — Z510 Encounter for antineoplastic radiation therapy: Secondary | ICD-10-CM | POA: Diagnosis not present

## 2015-10-16 DIAGNOSIS — E1140 Type 2 diabetes mellitus with diabetic neuropathy, unspecified: Secondary | ICD-10-CM | POA: Diagnosis not present

## 2015-10-16 DIAGNOSIS — Z170 Estrogen receptor positive status [ER+]: Secondary | ICD-10-CM | POA: Diagnosis not present

## 2015-10-16 DIAGNOSIS — C50412 Malignant neoplasm of upper-outer quadrant of left female breast: Secondary | ICD-10-CM | POA: Diagnosis not present

## 2015-10-21 ENCOUNTER — Ambulatory Visit
Admission: RE | Admit: 2015-10-21 | Discharge: 2015-10-21 | Disposition: A | Discharge status: Home / Self Care | Payer: Medicare Other | Source: Ambulatory Visit | Attending: Radiation Oncology | Admitting: Radiation Oncology

## 2015-10-21 DIAGNOSIS — Z510 Encounter for antineoplastic radiation therapy: Secondary | ICD-10-CM | POA: Diagnosis not present

## 2015-10-21 DIAGNOSIS — Z170 Estrogen receptor positive status [ER+]: Secondary | ICD-10-CM | POA: Diagnosis not present

## 2015-10-21 DIAGNOSIS — C50412 Malignant neoplasm of upper-outer quadrant of left female breast: Secondary | ICD-10-CM | POA: Diagnosis not present

## 2015-10-21 DIAGNOSIS — Z79899 Other long term (current) drug therapy: Secondary | ICD-10-CM | POA: Diagnosis not present

## 2015-10-21 DIAGNOSIS — E1140 Type 2 diabetes mellitus with diabetic neuropathy, unspecified: Secondary | ICD-10-CM | POA: Diagnosis not present

## 2015-10-21 DIAGNOSIS — I10 Essential (primary) hypertension: Secondary | ICD-10-CM | POA: Diagnosis not present

## 2015-10-21 NOTE — Progress Notes (Signed)
  Radiation Oncology         [phone]) [phone] ________________________________  Name: [name] [name] MRN: [id number]  Date: 10/21/2015  DOB: 1950-06-17  Simulation Verification Note    ICD-9-CM ICD-10-CM   1. Breast cancer of upper-outer quadrant of left female breast ([place]) 174.4 C50.412     Status: outpatient  NARRATIVE: The patient was brought to the treatment unit and placed in the planned treatment position. The clinical setup was verified. Then port films were obtained and uploaded to the radiation oncology medical record software.  The treatment beams were carefully compared against the planned radiation fields. The position location and shape of the radiation fields was reviewed. They targeted volume of tissue appears to be appropriately covered by the radiation beams. Organs at risk appear to be excluded as planned.  Based on my personal review, I approved the simulation verification. The patient's treatment will proceed as planned.  -----------------------------------  Blair Promise, PhD, MD

## 2015-10-22 ENCOUNTER — Ambulatory Visit: Admission: RE | Admit: 2015-10-22 | Payer: Medicare Other | Source: Ambulatory Visit

## 2015-10-22 ENCOUNTER — Ambulatory Visit
Admission: RE | Admit: 2015-10-22 | Discharge: 2015-10-22 | Disposition: A | Discharge status: Home / Self Care | Payer: Medicare Other | Source: Ambulatory Visit | Attending: Radiation Oncology | Admitting: Radiation Oncology

## 2015-10-22 DIAGNOSIS — E1140 Type 2 diabetes mellitus with diabetic neuropathy, unspecified: Secondary | ICD-10-CM | POA: Diagnosis not present

## 2015-10-22 DIAGNOSIS — Z510 Encounter for antineoplastic radiation therapy: Secondary | ICD-10-CM | POA: Diagnosis not present

## 2015-10-22 DIAGNOSIS — I10 Essential (primary) hypertension: Secondary | ICD-10-CM | POA: Diagnosis not present

## 2015-10-22 DIAGNOSIS — Z170 Estrogen receptor positive status [ER+]: Secondary | ICD-10-CM | POA: Diagnosis not present

## 2015-10-22 DIAGNOSIS — C50412 Malignant neoplasm of upper-outer quadrant of left female breast: Secondary | ICD-10-CM | POA: Diagnosis not present

## 2015-10-22 DIAGNOSIS — Z79899 Other long term (current) drug therapy: Secondary | ICD-10-CM | POA: Diagnosis not present

## 2015-10-23 ENCOUNTER — Ambulatory Visit
Admission: RE | Admit: 2015-10-23 | Discharge: 2015-10-23 | Disposition: A | Discharge status: Home / Self Care | Payer: Medicare Other | Source: Ambulatory Visit | Attending: Radiation Oncology | Admitting: Radiation Oncology

## 2015-10-23 DIAGNOSIS — Z79899 Other long term (current) drug therapy: Secondary | ICD-10-CM | POA: Diagnosis not present

## 2015-10-23 DIAGNOSIS — C50412 Malignant neoplasm of upper-outer quadrant of left female breast: Secondary | ICD-10-CM | POA: Diagnosis not present

## 2015-10-23 DIAGNOSIS — Z510 Encounter for antineoplastic radiation therapy: Secondary | ICD-10-CM | POA: Diagnosis not present

## 2015-10-23 DIAGNOSIS — E1140 Type 2 diabetes mellitus with diabetic neuropathy, unspecified: Secondary | ICD-10-CM | POA: Diagnosis not present

## 2015-10-23 DIAGNOSIS — Z170 Estrogen receptor positive status [ER+]: Secondary | ICD-10-CM | POA: Diagnosis not present

## 2015-10-23 DIAGNOSIS — I10 Essential (primary) hypertension: Secondary | ICD-10-CM | POA: Diagnosis not present

## 2015-10-24 ENCOUNTER — Ambulatory Visit
Admission: RE | Admit: 2015-10-24 | Discharge: 2015-10-24 | Disposition: A | Discharge status: Home / Self Care | Payer: Medicare Other | Source: Ambulatory Visit | Attending: Radiation Oncology | Admitting: Radiation Oncology

## 2015-10-24 DIAGNOSIS — Z79899 Other long term (current) drug therapy: Secondary | ICD-10-CM | POA: Diagnosis not present

## 2015-10-24 DIAGNOSIS — C50412 Malignant neoplasm of upper-outer quadrant of left female breast: Secondary | ICD-10-CM

## 2015-10-24 DIAGNOSIS — I10 Essential (primary) hypertension: Secondary | ICD-10-CM | POA: Diagnosis not present

## 2015-10-24 DIAGNOSIS — Z510 Encounter for antineoplastic radiation therapy: Secondary | ICD-10-CM | POA: Diagnosis not present

## 2015-10-24 DIAGNOSIS — Z170 Estrogen receptor positive status [ER+]: Secondary | ICD-10-CM | POA: Diagnosis not present

## 2015-10-24 DIAGNOSIS — E1140 Type 2 diabetes mellitus with diabetic neuropathy, unspecified: Secondary | ICD-10-CM | POA: Diagnosis not present

## 2015-10-24 MED ORDER — SONAFINE EX EMUL
1.0000 "application " | Freq: Once | CUTANEOUS | Status: AC
  Administered 2015-10-24: 1 via TOPICAL

## 2015-10-24 MED ORDER — ALRA NON-METALLIC DEODORANT (RAD-ONC)
1.0000 "application " | Freq: Once | TOPICAL | Status: AC
  Administered 2015-10-24: 1 via TOPICAL

## 2015-10-24 NOTE — Progress Notes (Addendum)
Pt here for patient teaching.  Pt given Radiation and You booklet, skin care instructions, Alra deodorant and Sonafine. Pt reports they have watched the Radiation Therapy Education video.  Reviewed areas of pertinence such as fatigue, skin changes, breast tenderness and breast swelling . Pt able to give teach back of to pat skin and use unscented/gentle soap,apply Sonafine bid and avoid applying anything to skin within 4 hours of treatment. Pt demonstrated understanding and verbalizes understanding of information given and will contact nursing with any questions or concerns.

## 2015-10-27 ENCOUNTER — Ambulatory Visit
Admission: RE | Admit: 2015-10-27 | Discharge: 2015-10-27 | Disposition: A | Discharge status: Home / Self Care | Payer: Medicare Other | Source: Ambulatory Visit | Attending: Radiation Oncology | Admitting: Radiation Oncology

## 2015-10-27 DIAGNOSIS — Z79899 Other long term (current) drug therapy: Secondary | ICD-10-CM | POA: Diagnosis not present

## 2015-10-27 DIAGNOSIS — E1140 Type 2 diabetes mellitus with diabetic neuropathy, unspecified: Secondary | ICD-10-CM | POA: Diagnosis not present

## 2015-10-27 DIAGNOSIS — C50412 Malignant neoplasm of upper-outer quadrant of left female breast: Secondary | ICD-10-CM | POA: Diagnosis not present

## 2015-10-27 DIAGNOSIS — I10 Essential (primary) hypertension: Secondary | ICD-10-CM | POA: Diagnosis not present

## 2015-10-27 DIAGNOSIS — Z510 Encounter for antineoplastic radiation therapy: Secondary | ICD-10-CM | POA: Diagnosis not present

## 2015-10-27 DIAGNOSIS — Z170 Estrogen receptor positive status [ER+]: Secondary | ICD-10-CM | POA: Diagnosis not present

## 2015-10-28 ENCOUNTER — Encounter: Payer: Self-pay | Admitting: Radiation Oncology

## 2015-10-28 ENCOUNTER — Ambulatory Visit
Admission: RE | Admit: 2015-10-28 | Discharge: 2015-10-28 | Disposition: A | Discharge status: Home / Self Care | Payer: Medicare Other | Source: Ambulatory Visit | Attending: Radiation Oncology | Admitting: Radiation Oncology

## 2015-10-28 VITALS — BP 121/77 | HR 98 | Temp 98.5°F | Ht 61.0 in | Wt 272.3 lb

## 2015-10-28 DIAGNOSIS — Z170 Estrogen receptor positive status [ER+]: Secondary | ICD-10-CM | POA: Diagnosis not present

## 2015-10-28 DIAGNOSIS — Z79899 Other long term (current) drug therapy: Secondary | ICD-10-CM | POA: Diagnosis not present

## 2015-10-28 DIAGNOSIS — C50412 Malignant neoplasm of upper-outer quadrant of left female breast: Secondary | ICD-10-CM

## 2015-10-28 DIAGNOSIS — Z510 Encounter for antineoplastic radiation therapy: Secondary | ICD-10-CM | POA: Diagnosis not present

## 2015-10-28 DIAGNOSIS — I10 Essential (primary) hypertension: Secondary | ICD-10-CM | POA: Diagnosis not present

## 2015-10-28 DIAGNOSIS — E1140 Type 2 diabetes mellitus with diabetic neuropathy, unspecified: Secondary | ICD-10-CM | POA: Diagnosis not present

## 2015-10-28 NOTE — Progress Notes (Signed)
Patient was given antifungal powder and telfa pads.  She was instructed to use the powder twice a day under her left breast.

## 2015-10-28 NOTE — Progress Notes (Signed)
  Radiation Oncology         [phone]) [phone] ________________________________  Name: [name] [name] MRN: [id number]  Date: 10/28/2015  DOB: [birth year]  Weekly Radiation Therapy Management    ICD-9-CM ICD-10-CM   1. Breast cancer of upper-outer quadrant of left female breast ([facility]) 174.4 C50.412      Current Dose: 9 Gy     Planned Dose:  60.4 Gy  Narrative . . . . . . . . The patient presents for routine under treatment assessment.                                   The patient is without complaint. She reports having some sharp pains under her left arm.  She has been having these since surgery.                                  Set-up films were reviewed.                                 The chart was checked. Physical Findings. . .  height is 5\' 1"  (1.549 m) and weight is 272 lb 4.8 oz (123.5 kg). Her oral temperature is 98.5 F (36.9 C). Her blood pressure is 121/77 and her pulse is 98. Her oxygen saturation is 97%. . Weight essentially stable.  No significant changes.  The lungs are clear. The heart has a regular rhythm and rate. The left breast area shows some mild hyperpigmentation changes. Impression . . . . . . . The patient is tolerating radiation. Plan . . . . . . . . . . . . Continue treatment as planned.  ________________________________   Blair Promise, PhD, MD

## 2015-10-28 NOTE — Progress Notes (Signed)
[name] [name] has completed 5 fractions to her left breat.  She reports having some sharp pains under her left arm.  She has been having these since surgery.  She denies having fatigue.  She is using sonafine.  The skin on her left breast is intact.  She does have an open area at the edge of her incision.  She also has some redness under her left breast that she says itches sometimes.  BP 121/77 (BP Location: Right Arm, Patient Position: Sitting)   Pulse 98   Temp 98.5 F (36.9 C) (Oral)   Ht 5\' 1"  (1.549 m)   Wt 272 lb 4.8 oz (123.5 kg)   SpO2 97%   BMI 51.45 kg/m    Wt Readings from Last 3 Encounters:  10/28/15 272 lb 4.8 oz (123.5 kg)  10/01/15 274 lb 3.2 oz (124.4 kg)  09/12/15 267 lb 12.8 oz (121.5 kg)

## 2015-10-29 ENCOUNTER — Ambulatory Visit
Admission: RE | Admit: 2015-10-29 | Discharge: 2015-10-29 | Disposition: A | Discharge status: Home / Self Care | Payer: Medicare Other | Source: Ambulatory Visit | Attending: Radiation Oncology | Admitting: Radiation Oncology

## 2015-10-29 DIAGNOSIS — Z79899 Other long term (current) drug therapy: Secondary | ICD-10-CM | POA: Diagnosis not present

## 2015-10-29 DIAGNOSIS — Z170 Estrogen receptor positive status [ER+]: Secondary | ICD-10-CM | POA: Diagnosis not present

## 2015-10-29 DIAGNOSIS — Z510 Encounter for antineoplastic radiation therapy: Secondary | ICD-10-CM | POA: Diagnosis not present

## 2015-10-29 DIAGNOSIS — C50412 Malignant neoplasm of upper-outer quadrant of left female breast: Secondary | ICD-10-CM | POA: Diagnosis not present

## 2015-10-29 DIAGNOSIS — I10 Essential (primary) hypertension: Secondary | ICD-10-CM | POA: Diagnosis not present

## 2015-10-29 DIAGNOSIS — E1140 Type 2 diabetes mellitus with diabetic neuropathy, unspecified: Secondary | ICD-10-CM | POA: Diagnosis not present

## 2015-10-30 ENCOUNTER — Ambulatory Visit
Admission: RE | Admit: 2015-10-30 | Discharge: 2015-10-30 | Disposition: A | Discharge status: Home / Self Care | Payer: Medicare Other | Source: Ambulatory Visit | Attending: Radiation Oncology | Admitting: Radiation Oncology

## 2015-10-30 DIAGNOSIS — Z510 Encounter for antineoplastic radiation therapy: Secondary | ICD-10-CM | POA: Diagnosis not present

## 2015-10-30 DIAGNOSIS — Z170 Estrogen receptor positive status [ER+]: Secondary | ICD-10-CM | POA: Diagnosis not present

## 2015-10-30 DIAGNOSIS — E1140 Type 2 diabetes mellitus with diabetic neuropathy, unspecified: Secondary | ICD-10-CM | POA: Diagnosis not present

## 2015-10-30 DIAGNOSIS — I10 Essential (primary) hypertension: Secondary | ICD-10-CM | POA: Diagnosis not present

## 2015-10-30 DIAGNOSIS — Z79899 Other long term (current) drug therapy: Secondary | ICD-10-CM | POA: Diagnosis not present

## 2015-10-30 DIAGNOSIS — C50412 Malignant neoplasm of upper-outer quadrant of left female breast: Secondary | ICD-10-CM | POA: Diagnosis not present

## 2015-10-31 ENCOUNTER — Ambulatory Visit
Admission: RE | Admit: 2015-10-31 | Discharge: 2015-10-31 | Disposition: A | Discharge status: Home / Self Care | Payer: Medicare Other | Source: Ambulatory Visit | Attending: Radiation Oncology | Admitting: Radiation Oncology

## 2015-10-31 DIAGNOSIS — C50412 Malignant neoplasm of upper-outer quadrant of left female breast: Secondary | ICD-10-CM | POA: Diagnosis not present

## 2015-10-31 DIAGNOSIS — Z510 Encounter for antineoplastic radiation therapy: Secondary | ICD-10-CM | POA: Diagnosis not present

## 2015-10-31 DIAGNOSIS — Z170 Estrogen receptor positive status [ER+]: Secondary | ICD-10-CM | POA: Diagnosis not present

## 2015-10-31 DIAGNOSIS — I10 Essential (primary) hypertension: Secondary | ICD-10-CM | POA: Diagnosis not present

## 2015-10-31 DIAGNOSIS — E1140 Type 2 diabetes mellitus with diabetic neuropathy, unspecified: Secondary | ICD-10-CM | POA: Diagnosis not present

## 2015-10-31 DIAGNOSIS — Z79899 Other long term (current) drug therapy: Secondary | ICD-10-CM | POA: Diagnosis not present

## 2015-11-03 ENCOUNTER — Ambulatory Visit: Payer: Medicare Other | Admitting: Oncology

## 2015-11-03 ENCOUNTER — Other Ambulatory Visit: Payer: Medicare Other

## 2015-11-03 ENCOUNTER — Ambulatory Visit
Admission: RE | Admit: 2015-11-03 | Discharge: 2015-11-03 | Disposition: A | Discharge status: Home / Self Care | Payer: Medicare Other | Source: Ambulatory Visit | Attending: Radiation Oncology | Admitting: Radiation Oncology

## 2015-11-03 DIAGNOSIS — I10 Essential (primary) hypertension: Secondary | ICD-10-CM | POA: Diagnosis not present

## 2015-11-03 DIAGNOSIS — G4730 Sleep apnea, unspecified: Secondary | ICD-10-CM | POA: Diagnosis not present

## 2015-11-03 DIAGNOSIS — G709 Myoneural disorder, unspecified: Secondary | ICD-10-CM | POA: Diagnosis not present

## 2015-11-03 DIAGNOSIS — E1140 Type 2 diabetes mellitus with diabetic neuropathy, unspecified: Secondary | ICD-10-CM | POA: Diagnosis not present

## 2015-11-03 DIAGNOSIS — F419 Anxiety disorder, unspecified: Secondary | ICD-10-CM | POA: Diagnosis not present

## 2015-11-03 DIAGNOSIS — Z170 Estrogen receptor positive status [ER+]: Secondary | ICD-10-CM | POA: Diagnosis not present

## 2015-11-03 DIAGNOSIS — Z79899 Other long term (current) drug therapy: Secondary | ICD-10-CM | POA: Diagnosis not present

## 2015-11-03 DIAGNOSIS — C50412 Malignant neoplasm of upper-outer quadrant of left female breast: Secondary | ICD-10-CM | POA: Diagnosis not present

## 2015-11-03 DIAGNOSIS — E079 Disorder of thyroid, unspecified: Secondary | ICD-10-CM | POA: Diagnosis not present

## 2015-11-03 DIAGNOSIS — Z510 Encounter for antineoplastic radiation therapy: Secondary | ICD-10-CM | POA: Diagnosis not present

## 2015-11-03 DIAGNOSIS — M1990 Unspecified osteoarthritis, unspecified site: Secondary | ICD-10-CM | POA: Diagnosis not present

## 2015-11-03 DIAGNOSIS — M109 Gout, unspecified: Secondary | ICD-10-CM | POA: Diagnosis not present

## 2015-11-03 DIAGNOSIS — Z885 Allergy status to narcotic agent status: Secondary | ICD-10-CM | POA: Diagnosis not present

## 2015-11-04 ENCOUNTER — Ambulatory Visit
Admission: RE | Admit: 2015-11-04 | Discharge: 2015-11-04 | Disposition: A | Discharge status: Home / Self Care | Payer: Medicare Other | Source: Ambulatory Visit | Attending: Radiation Oncology | Admitting: Radiation Oncology

## 2015-11-04 VITALS — BP 120/73 | HR 88 | Temp 98.2°F | Resp 18 | Ht 61.0 in | Wt 272.4 lb

## 2015-11-04 DIAGNOSIS — Z79899 Other long term (current) drug therapy: Secondary | ICD-10-CM | POA: Diagnosis not present

## 2015-11-04 DIAGNOSIS — C50412 Malignant neoplasm of upper-outer quadrant of left female breast: Secondary | ICD-10-CM

## 2015-11-04 DIAGNOSIS — Z510 Encounter for antineoplastic radiation therapy: Secondary | ICD-10-CM | POA: Diagnosis not present

## 2015-11-04 DIAGNOSIS — I10 Essential (primary) hypertension: Secondary | ICD-10-CM | POA: Diagnosis not present

## 2015-11-04 DIAGNOSIS — Z170 Estrogen receptor positive status [ER+]: Secondary | ICD-10-CM | POA: Diagnosis not present

## 2015-11-04 DIAGNOSIS — E1140 Type 2 diabetes mellitus with diabetic neuropathy, unspecified: Secondary | ICD-10-CM | POA: Diagnosis not present

## 2015-11-04 NOTE — Progress Notes (Signed)
Alisandra Stork has completed 10 fractions to her left breat.  She reports having some sharp pains under her left arm.  She has been having these since surgery.  She denies having fatigue.  She is using sonafine.  The skin on her left breast is intact.  She does have an open area at the edge of her incision.   Using telfa pad under her left breast.  She also has some redness under her left breast that she says itches sometimes. Wt Readings from Last 3 Encounters:  11/04/15 272 lb 6.4 oz (123.6 kg)  10/28/15 272 lb 4.8 oz (123.5 kg)  10/01/15 274 lb 3.2 oz (124.4 kg)  BP 120/73 (BP Location: Right Arm, Patient Position: Sitting, Cuff Size: Normal)   Pulse 88   Temp 98.2 F (36.8 C) (Oral)   Resp 18   Ht 5\' 1"  (1.549 m)   Wt 272 lb 6.4 oz (123.6 kg)   SpO2 95%   BMI 51.47 kg/m

## 2015-11-04 NOTE — Progress Notes (Signed)
  Radiation Oncology         [phone]) [phone] ________________________________  Name: [name] [name] MRN: [id number]  Date: 11/04/2015  DOB: 1950/02/10  Weekly Radiation Therapy Management    ICD-9-CM ICD-10-CM   1. Malignant neoplasm of upper-outer quadrant of left female breast, unspecified estrogen receptor status (HCC) 174.4 C50.412         Current Dose: 18 Gy     Planned Dose:  60.4 Gy  Narrative . . . . . . . . The patient presents for routine under treatment assessment.                                   Lienhard has completed 10 fractions to her left breat. She reports having some sharp pains under her left arm. She has been having these since surgery. She denies having fatigue. She is using sonafine. The skin on her left breast is intact. She does have an open area at the edge of her incision.  Using telfa pad under her left breast.  She also has some redness under her left breast that she says itches sometimes. The patient reports that her mouth feels "yucky."                                   Set-up films were reviewed.                                 The chart was checked. Physical Findings. . .  height is 5\' 1"  (1.549 m) and weight is 272 lb 6.4 oz (123.6 kg). Her oral temperature is 98.2 F (36.8 C). Her blood pressure is 120/73 and her pulse is 88. Her respiration is 18 and oxygen saturation is 95%. . Weight essentially stable.  No significant changes.  The lungs are clear. The heart has a regular rhythm and rate. The left breast area shows some mild hyperpigmentation changes. No yeast on oral exam.  Impression . . . . . . . The patient is tolerating radiation. Plan . . . . . . . . . . . . Continue treatment as planned.  ________________________________   Blair Promise, PhD, MD  This document serves as a record of services personally performed by Gery Pray, MD. It was created on his behalf by Truddie Hidden, a trained medical scribe. The creation of this record is based  on the scribe's personal observations and the provider's statements to them. This document has been checked and approved by the attending provider.

## 2015-11-05 ENCOUNTER — Ambulatory Visit
Admission: RE | Admit: 2015-11-05 | Discharge: 2015-11-05 | Disposition: A | Discharge status: Home / Self Care | Payer: Medicare Other | Source: Ambulatory Visit | Attending: Radiation Oncology | Admitting: Radiation Oncology

## 2015-11-05 DIAGNOSIS — Z510 Encounter for antineoplastic radiation therapy: Secondary | ICD-10-CM | POA: Diagnosis not present

## 2015-11-05 DIAGNOSIS — Z79899 Other long term (current) drug therapy: Secondary | ICD-10-CM | POA: Diagnosis not present

## 2015-11-05 DIAGNOSIS — I10 Essential (primary) hypertension: Secondary | ICD-10-CM | POA: Diagnosis not present

## 2015-11-05 DIAGNOSIS — E1140 Type 2 diabetes mellitus with diabetic neuropathy, unspecified: Secondary | ICD-10-CM | POA: Diagnosis not present

## 2015-11-05 DIAGNOSIS — Z170 Estrogen receptor positive status [ER+]: Secondary | ICD-10-CM | POA: Diagnosis not present

## 2015-11-05 DIAGNOSIS — C50412 Malignant neoplasm of upper-outer quadrant of left female breast: Secondary | ICD-10-CM | POA: Diagnosis not present

## 2015-11-06 ENCOUNTER — Ambulatory Visit
Admission: RE | Admit: 2015-11-06 | Discharge: 2015-11-06 | Disposition: A | Discharge status: Home / Self Care | Payer: Medicare Other | Source: Ambulatory Visit | Attending: Radiation Oncology | Admitting: Radiation Oncology

## 2015-11-06 DIAGNOSIS — Z170 Estrogen receptor positive status [ER+]: Secondary | ICD-10-CM | POA: Diagnosis not present

## 2015-11-06 DIAGNOSIS — Z510 Encounter for antineoplastic radiation therapy: Secondary | ICD-10-CM | POA: Diagnosis not present

## 2015-11-06 DIAGNOSIS — Z79899 Other long term (current) drug therapy: Secondary | ICD-10-CM | POA: Diagnosis not present

## 2015-11-06 DIAGNOSIS — E1140 Type 2 diabetes mellitus with diabetic neuropathy, unspecified: Secondary | ICD-10-CM | POA: Diagnosis not present

## 2015-11-06 DIAGNOSIS — I10 Essential (primary) hypertension: Secondary | ICD-10-CM | POA: Diagnosis not present

## 2015-11-06 DIAGNOSIS — C50412 Malignant neoplasm of upper-outer quadrant of left female breast: Secondary | ICD-10-CM | POA: Diagnosis not present

## 2015-11-07 ENCOUNTER — Ambulatory Visit: Payer: Medicare Other

## 2015-11-10 ENCOUNTER — Ambulatory Visit
Admission: RE | Admit: 2015-11-10 | Discharge: 2015-11-10 | Disposition: A | Discharge status: Home / Self Care | Payer: Medicare Other | Source: Ambulatory Visit | Attending: Radiation Oncology | Admitting: Radiation Oncology

## 2015-11-10 DIAGNOSIS — Z79899 Other long term (current) drug therapy: Secondary | ICD-10-CM | POA: Diagnosis not present

## 2015-11-10 DIAGNOSIS — Z510 Encounter for antineoplastic radiation therapy: Secondary | ICD-10-CM | POA: Diagnosis not present

## 2015-11-10 DIAGNOSIS — I10 Essential (primary) hypertension: Secondary | ICD-10-CM | POA: Diagnosis not present

## 2015-11-10 DIAGNOSIS — Z170 Estrogen receptor positive status [ER+]: Secondary | ICD-10-CM | POA: Diagnosis not present

## 2015-11-10 DIAGNOSIS — C50412 Malignant neoplasm of upper-outer quadrant of left female breast: Secondary | ICD-10-CM | POA: Diagnosis not present

## 2015-11-10 DIAGNOSIS — E1140 Type 2 diabetes mellitus with diabetic neuropathy, unspecified: Secondary | ICD-10-CM | POA: Diagnosis not present

## 2015-11-11 ENCOUNTER — Encounter: Payer: Self-pay | Admitting: Radiation Oncology

## 2015-11-11 ENCOUNTER — Ambulatory Visit
Admission: RE | Admit: 2015-11-11 | Discharge: 2015-11-11 | Disposition: A | Discharge status: Home / Self Care | Payer: Medicare Other | Source: Ambulatory Visit | Attending: Radiation Oncology | Admitting: Radiation Oncology

## 2015-11-11 VITALS — BP 120/77 | HR 84 | Temp 97.9°F | Ht 61.0 in | Wt 272.6 lb

## 2015-11-11 DIAGNOSIS — Z170 Estrogen receptor positive status [ER+]: Secondary | ICD-10-CM | POA: Diagnosis not present

## 2015-11-11 DIAGNOSIS — C50412 Malignant neoplasm of upper-outer quadrant of left female breast: Secondary | ICD-10-CM | POA: Diagnosis not present

## 2015-11-11 DIAGNOSIS — Z510 Encounter for antineoplastic radiation therapy: Secondary | ICD-10-CM | POA: Diagnosis not present

## 2015-11-11 DIAGNOSIS — E1140 Type 2 diabetes mellitus with diabetic neuropathy, unspecified: Secondary | ICD-10-CM | POA: Diagnosis not present

## 2015-11-11 DIAGNOSIS — I10 Essential (primary) hypertension: Secondary | ICD-10-CM | POA: Diagnosis not present

## 2015-11-11 DIAGNOSIS — Z79899 Other long term (current) drug therapy: Secondary | ICD-10-CM | POA: Diagnosis not present

## 2015-11-11 NOTE — Progress Notes (Signed)
  Radiation Oncology         [phone]) [phone] ________________________________  Name: [name] [name] MRN: [id number]  Date: 11/11/2015  DOB: 1950-07-12  Weekly Radiation Therapy Management    ICD-9-CM ICD-10-CM   1. Malignant neoplasm of upper-outer quadrant of left female breast, unspecified estrogen receptor status (HCC) 174.4 C50.412         Current Dose: 25.2 Gy     Planned Dose:  60.4 Gy  Narrative . . . . . . . . The patient presents for routine under treatment assessment.                                   [name] [name] has completed 14 fractions to her left breast.  She denies having pain other than occasional sharp pains in her left breast.  She reports having fatigue.  She is using sonafine and nystatin powder under her left breast.  She reports having a sore area on her right upper arm, near her underarm area.  She said she can feel a knot in this area that she has had for a while.  The skin on her left breast is pink with hyperpigmentation under her breast. The patient takes alleve to grant her relief from some pain along her breast.                                    Set-up films were reviewed.                                 The chart was checked. Physical Findings. . .  height is 5\' 1"  (1.549 m) and weight is 272 lb 9.6 oz (123.7 kg). Her oral temperature is 97.9 F (36.6 C). Her blood pressure is 120/77 and her pulse is 84. Her oxygen saturation is 97%. . Weight essentially stable.  No significant changes.  The lungs are clear. The heart has a regular rhythm and rate. The left breast area shows some mild hyperpigmentation changes. More significant erthyema in the inframammary fold, but is improved from last week.  No skin breakdown noted.  Impression . . . . . . . The patient is tolerating radiation. Plan . . . . . . . . . . . . Continue treatment as planned.  ________________________________   Blair Promise, PhD, MD  This document serves as a record of services personally performed  by Gery Pray, MD. It was created on his behalf by Truddie Hidden, a trained medical scribe. The creation of this record is based on the scribe's personal observations and the provider's statements to them. This document has been checked and approved by the attending provider.

## 2015-11-11 NOTE — Progress Notes (Signed)
[name] [name] has completed 14 fractions to her left breast.  She denies having pain other than occasional sharp pains in her left breast.  She reports having fatigue.  She is using sonafine and nystatin powder under her left breast.  She reports having a sore area on her right upper arm, near her underarm area.  She said she can feel a knot in this area that she has had for a while.  The skin on her left breast is pink with hyperpigmentation under her breast.  BP 120/77 (BP Location: Right Wrist, Patient Position: Sitting)   Pulse 84   Temp 97.9 F (36.6 C) (Oral)   Ht 5\' 1"  (1.549 m)   Wt 272 lb 9.6 oz (123.7 kg)   SpO2 97%   BMI 51.51 kg/m    Wt Readings from Last 3 Encounters:  11/11/15 272 lb 9.6 oz (123.7 kg)  11/04/15 272 lb 6.4 oz (123.6 kg)  10/28/15 272 lb 4.8 oz (123.5 kg)

## 2015-11-12 ENCOUNTER — Ambulatory Visit
Admission: RE | Admit: 2015-11-12 | Discharge: 2015-11-12 | Disposition: A | Discharge status: Home / Self Care | Payer: Medicare Other | Source: Ambulatory Visit | Attending: Radiation Oncology | Admitting: Radiation Oncology

## 2015-11-12 DIAGNOSIS — Z170 Estrogen receptor positive status [ER+]: Secondary | ICD-10-CM | POA: Diagnosis not present

## 2015-11-12 DIAGNOSIS — Z510 Encounter for antineoplastic radiation therapy: Secondary | ICD-10-CM | POA: Diagnosis not present

## 2015-11-12 DIAGNOSIS — C50412 Malignant neoplasm of upper-outer quadrant of left female breast: Secondary | ICD-10-CM | POA: Diagnosis not present

## 2015-11-12 DIAGNOSIS — Z79899 Other long term (current) drug therapy: Secondary | ICD-10-CM | POA: Diagnosis not present

## 2015-11-12 DIAGNOSIS — E1140 Type 2 diabetes mellitus with diabetic neuropathy, unspecified: Secondary | ICD-10-CM | POA: Diagnosis not present

## 2015-11-12 DIAGNOSIS — I10 Essential (primary) hypertension: Secondary | ICD-10-CM | POA: Diagnosis not present

## 2015-11-13 ENCOUNTER — Ambulatory Visit
Admission: RE | Admit: 2015-11-13 | Discharge: 2015-11-13 | Disposition: A | Discharge status: Home / Self Care | Payer: Medicare Other | Source: Ambulatory Visit | Attending: Radiation Oncology | Admitting: Radiation Oncology

## 2015-11-13 DIAGNOSIS — C50412 Malignant neoplasm of upper-outer quadrant of left female breast: Secondary | ICD-10-CM | POA: Diagnosis not present

## 2015-11-13 DIAGNOSIS — Z510 Encounter for antineoplastic radiation therapy: Secondary | ICD-10-CM | POA: Diagnosis not present

## 2015-11-13 DIAGNOSIS — Z170 Estrogen receptor positive status [ER+]: Secondary | ICD-10-CM | POA: Diagnosis not present

## 2015-11-13 DIAGNOSIS — I10 Essential (primary) hypertension: Secondary | ICD-10-CM | POA: Diagnosis not present

## 2015-11-13 DIAGNOSIS — Z79899 Other long term (current) drug therapy: Secondary | ICD-10-CM | POA: Diagnosis not present

## 2015-11-13 DIAGNOSIS — E1140 Type 2 diabetes mellitus with diabetic neuropathy, unspecified: Secondary | ICD-10-CM | POA: Diagnosis not present

## 2015-11-14 ENCOUNTER — Ambulatory Visit
Admission: RE | Admit: 2015-11-14 | Discharge: 2015-11-14 | Disposition: A | Discharge status: Home / Self Care | Payer: Medicare Other | Source: Ambulatory Visit | Attending: Radiation Oncology | Admitting: Radiation Oncology

## 2015-11-14 DIAGNOSIS — I10 Essential (primary) hypertension: Secondary | ICD-10-CM | POA: Diagnosis not present

## 2015-11-14 DIAGNOSIS — E1140 Type 2 diabetes mellitus with diabetic neuropathy, unspecified: Secondary | ICD-10-CM | POA: Diagnosis not present

## 2015-11-14 DIAGNOSIS — C50412 Malignant neoplasm of upper-outer quadrant of left female breast: Secondary | ICD-10-CM | POA: Diagnosis not present

## 2015-11-14 DIAGNOSIS — Z510 Encounter for antineoplastic radiation therapy: Secondary | ICD-10-CM | POA: Diagnosis not present

## 2015-11-14 DIAGNOSIS — Z170 Estrogen receptor positive status [ER+]: Secondary | ICD-10-CM | POA: Diagnosis not present

## 2015-11-14 DIAGNOSIS — Z79899 Other long term (current) drug therapy: Secondary | ICD-10-CM | POA: Diagnosis not present

## 2015-11-17 ENCOUNTER — Ambulatory Visit
Admission: RE | Admit: 2015-11-17 | Discharge: 2015-11-17 | Disposition: A | Discharge status: Home / Self Care | Payer: Medicare Other | Source: Ambulatory Visit | Attending: Radiation Oncology | Admitting: Radiation Oncology

## 2015-11-17 DIAGNOSIS — Z170 Estrogen receptor positive status [ER+]: Secondary | ICD-10-CM | POA: Diagnosis not present

## 2015-11-17 DIAGNOSIS — Z79899 Other long term (current) drug therapy: Secondary | ICD-10-CM | POA: Diagnosis not present

## 2015-11-17 DIAGNOSIS — I10 Essential (primary) hypertension: Secondary | ICD-10-CM | POA: Diagnosis not present

## 2015-11-17 DIAGNOSIS — C50412 Malignant neoplasm of upper-outer quadrant of left female breast: Secondary | ICD-10-CM | POA: Diagnosis not present

## 2015-11-17 DIAGNOSIS — Z510 Encounter for antineoplastic radiation therapy: Secondary | ICD-10-CM | POA: Diagnosis not present

## 2015-11-17 DIAGNOSIS — E1140 Type 2 diabetes mellitus with diabetic neuropathy, unspecified: Secondary | ICD-10-CM | POA: Diagnosis not present

## 2015-11-18 ENCOUNTER — Ambulatory Visit
Admission: RE | Admit: 2015-11-18 | Discharge: 2015-11-18 | Disposition: A | Discharge status: Home / Self Care | Payer: Medicare Other | Source: Ambulatory Visit | Attending: Radiation Oncology | Admitting: Radiation Oncology

## 2015-11-18 ENCOUNTER — Encounter: Payer: Self-pay | Admitting: Radiation Oncology

## 2015-11-18 VITALS — BP 149/78 | HR 92 | Temp 98.1°F | Ht 61.0 in | Wt 270.6 lb

## 2015-11-18 DIAGNOSIS — I10 Essential (primary) hypertension: Secondary | ICD-10-CM | POA: Diagnosis not present

## 2015-11-18 DIAGNOSIS — C50412 Malignant neoplasm of upper-outer quadrant of left female breast: Secondary | ICD-10-CM

## 2015-11-18 DIAGNOSIS — Z170 Estrogen receptor positive status [ER+]: Secondary | ICD-10-CM | POA: Diagnosis not present

## 2015-11-18 DIAGNOSIS — E1140 Type 2 diabetes mellitus with diabetic neuropathy, unspecified: Secondary | ICD-10-CM | POA: Diagnosis not present

## 2015-11-18 DIAGNOSIS — Z510 Encounter for antineoplastic radiation therapy: Secondary | ICD-10-CM | POA: Diagnosis not present

## 2015-11-18 DIAGNOSIS — Z79899 Other long term (current) drug therapy: Secondary | ICD-10-CM | POA: Diagnosis not present

## 2015-11-18 MED ORDER — OXYCODONE-ACETAMINOPHEN 5-325 MG PO TABS: 1.0000 | ORAL_TABLET | ORAL | 0 refills | Status: DC | PRN

## 2015-11-18 NOTE — Progress Notes (Signed)
[name] [name] has completed 19 fractions to her left breast.  She reports having sharp pains in her left breast that are only relieved when she takes tylenol.  She said the tylenol only works for about 2 hours.  She reports having fatigue.  She is using sonafine and neosporin under her left breast.  The skin on her left breast is red with small peeling areas underneath her breast.    BP (!) 149/78 (BP Location: Right Wrist, Patient Position: Sitting)   Pulse 92   Temp 98.1 F (36.7 C) (Oral)   Ht 5\' 1"  (1.549 m)   Wt 270 lb 9.6 oz (122.7 kg)   SpO2 99%   BMI 51.13 kg/m    Wt Readings from Last 3 Encounters:  11/18/15 270 lb 9.6 oz (122.7 kg)  11/11/15 272 lb 9.6 oz (123.7 kg)  11/04/15 272 lb 6.4 oz (123.6 kg)

## 2015-11-18 NOTE — Progress Notes (Signed)
  Radiation Oncology         [phone]) [phone] ________________________________  Name: [name] [name] MRN: [id number]  Date: 11/18/2015  DOB: 12-24-1950  Weekly Radiation Therapy Management    ICD-9-CM ICD-10-CM   1. Malignant neoplasm of upper-outer quadrant of left female breast, unspecified estrogen receptor status (HCC) 174.4 C50.412         Current Dose: 34.2 Gy     Planned Dose:  60.4 Gy  Narrative . . . . . . . . The patient presents for routine under treatment assessment.                                   [name] [name] has completed 19 fractions to her left breast.  She reports having sharp pains in her left breast that are only relieved when she takes tylenol.  She said the tylenol only works for about 2 hours.  She reports having fatigue.  She is using sonafine and neosporin under her left breast.  The skin on her left breast is red with small peeling areas underneath her breast.  The patient states that she is markedly more fatigued today. The patient's family indicated that she might need stronger pain medication than what she is currently taking (tylenol).                                     Set-up films were reviewed.                                 The chart was checked. Physical Findings. . .  height is 5\' 1"  (1.549 m) and weight is 270 lb 9.6 oz (122.7 kg). Her oral temperature is 98.1 F (36.7 C). Her blood pressure is 149/78 (abnormal) and her pulse is 92. Her oxygen saturation is 99%. . Weight essentially stable.  No significant changes.  The lungs are clear. The heart has a regular rhythm and rate. The left breast area shows some mild hyperpigmentation changes. Very small areas of breakdown in the inframammary fold.  No signs of infection.  Impression . . . . . . . The patient is tolerating radiation. Plan . . . . . . . . . . . . Continue treatment as planned. I will be prescribing the patient percocet.   ________________________________   Blair Promise, PhD, MD  This  document serves as a record of services personally performed by Gery Pray, MD. It was created on his behalf by Truddie Hidden, a trained medical scribe. The creation of this record is based on the scribe's personal observations and the provider's statements to them. This document has been checked and approved by the attending provider.

## 2015-11-19 ENCOUNTER — Ambulatory Visit
Admission: RE | Admit: 2015-11-19 | Discharge: 2015-11-19 | Disposition: A | Discharge status: Home / Self Care | Payer: Medicare Other | Source: Ambulatory Visit | Attending: Radiation Oncology | Admitting: Radiation Oncology

## 2015-11-19 DIAGNOSIS — Z170 Estrogen receptor positive status [ER+]: Secondary | ICD-10-CM | POA: Diagnosis not present

## 2015-11-19 DIAGNOSIS — I10 Essential (primary) hypertension: Secondary | ICD-10-CM | POA: Diagnosis not present

## 2015-11-19 DIAGNOSIS — C50412 Malignant neoplasm of upper-outer quadrant of left female breast: Secondary | ICD-10-CM | POA: Diagnosis not present

## 2015-11-19 DIAGNOSIS — E1140 Type 2 diabetes mellitus with diabetic neuropathy, unspecified: Secondary | ICD-10-CM | POA: Diagnosis not present

## 2015-11-19 DIAGNOSIS — Z510 Encounter for antineoplastic radiation therapy: Secondary | ICD-10-CM | POA: Diagnosis not present

## 2015-11-19 DIAGNOSIS — Z79899 Other long term (current) drug therapy: Secondary | ICD-10-CM | POA: Diagnosis not present

## 2015-11-20 ENCOUNTER — Ambulatory Visit
Admission: RE | Admit: 2015-11-20 | Discharge: 2015-11-20 | Disposition: A | Discharge status: Home / Self Care | Payer: Medicare Other | Source: Ambulatory Visit | Attending: Radiation Oncology | Admitting: Radiation Oncology

## 2015-11-20 DIAGNOSIS — C50412 Malignant neoplasm of upper-outer quadrant of left female breast: Secondary | ICD-10-CM | POA: Diagnosis not present

## 2015-11-20 DIAGNOSIS — E1140 Type 2 diabetes mellitus with diabetic neuropathy, unspecified: Secondary | ICD-10-CM | POA: Diagnosis not present

## 2015-11-20 DIAGNOSIS — I10 Essential (primary) hypertension: Secondary | ICD-10-CM | POA: Diagnosis not present

## 2015-11-20 DIAGNOSIS — Z79899 Other long term (current) drug therapy: Secondary | ICD-10-CM | POA: Diagnosis not present

## 2015-11-20 DIAGNOSIS — Z510 Encounter for antineoplastic radiation therapy: Secondary | ICD-10-CM | POA: Diagnosis not present

## 2015-11-20 DIAGNOSIS — Z170 Estrogen receptor positive status [ER+]: Secondary | ICD-10-CM | POA: Diagnosis not present

## 2015-11-21 ENCOUNTER — Ambulatory Visit
Admission: RE | Admit: 2015-11-21 | Discharge: 2015-11-21 | Disposition: A | Discharge status: Home / Self Care | Payer: Medicare Other | Source: Ambulatory Visit | Attending: Radiation Oncology | Admitting: Radiation Oncology

## 2015-11-21 DIAGNOSIS — I10 Essential (primary) hypertension: Secondary | ICD-10-CM | POA: Diagnosis not present

## 2015-11-21 DIAGNOSIS — Z79899 Other long term (current) drug therapy: Secondary | ICD-10-CM | POA: Diagnosis not present

## 2015-11-21 DIAGNOSIS — E1140 Type 2 diabetes mellitus with diabetic neuropathy, unspecified: Secondary | ICD-10-CM | POA: Diagnosis not present

## 2015-11-21 DIAGNOSIS — Z510 Encounter for antineoplastic radiation therapy: Secondary | ICD-10-CM | POA: Diagnosis not present

## 2015-11-21 DIAGNOSIS — Z170 Estrogen receptor positive status [ER+]: Secondary | ICD-10-CM | POA: Diagnosis not present

## 2015-11-21 DIAGNOSIS — C50412 Malignant neoplasm of upper-outer quadrant of left female breast: Secondary | ICD-10-CM | POA: Diagnosis not present

## 2015-11-24 ENCOUNTER — Ambulatory Visit
Admission: RE | Admit: 2015-11-24 | Discharge: 2015-11-24 | Disposition: A | Discharge status: Home / Self Care | Payer: Medicare Other | Source: Ambulatory Visit | Attending: Radiation Oncology | Admitting: Radiation Oncology

## 2015-11-24 DIAGNOSIS — C50412 Malignant neoplasm of upper-outer quadrant of left female breast: Secondary | ICD-10-CM | POA: Diagnosis not present

## 2015-11-24 DIAGNOSIS — I10 Essential (primary) hypertension: Secondary | ICD-10-CM | POA: Diagnosis not present

## 2015-11-24 DIAGNOSIS — Z79899 Other long term (current) drug therapy: Secondary | ICD-10-CM | POA: Diagnosis not present

## 2015-11-24 DIAGNOSIS — E1140 Type 2 diabetes mellitus with diabetic neuropathy, unspecified: Secondary | ICD-10-CM | POA: Diagnosis not present

## 2015-11-24 DIAGNOSIS — Z510 Encounter for antineoplastic radiation therapy: Secondary | ICD-10-CM | POA: Diagnosis not present

## 2015-11-24 DIAGNOSIS — Z170 Estrogen receptor positive status [ER+]: Secondary | ICD-10-CM | POA: Diagnosis not present

## 2015-11-25 ENCOUNTER — Ambulatory Visit
Admission: RE | Admit: 2015-11-25 | Discharge: 2015-11-25 | Disposition: A | Discharge status: Home / Self Care | Payer: Medicare Other | Source: Ambulatory Visit | Attending: Radiation Oncology | Admitting: Radiation Oncology

## 2015-11-25 ENCOUNTER — Encounter: Payer: Self-pay | Admitting: Radiation Oncology

## 2015-11-25 VITALS — BP 137/87 | HR 93 | Temp 98.5°F | Resp 18 | Ht 61.0 in | Wt 270.4 lb

## 2015-11-25 DIAGNOSIS — Z79899 Other long term (current) drug therapy: Secondary | ICD-10-CM | POA: Diagnosis not present

## 2015-11-25 DIAGNOSIS — I10 Essential (primary) hypertension: Secondary | ICD-10-CM | POA: Diagnosis not present

## 2015-11-25 DIAGNOSIS — Z170 Estrogen receptor positive status [ER+]: Secondary | ICD-10-CM | POA: Diagnosis not present

## 2015-11-25 DIAGNOSIS — C50412 Malignant neoplasm of upper-outer quadrant of left female breast: Secondary | ICD-10-CM | POA: Diagnosis not present

## 2015-11-25 DIAGNOSIS — Z510 Encounter for antineoplastic radiation therapy: Secondary | ICD-10-CM | POA: Diagnosis not present

## 2015-11-25 DIAGNOSIS — E1140 Type 2 diabetes mellitus with diabetic neuropathy, unspecified: Secondary | ICD-10-CM | POA: Diagnosis not present

## 2015-11-25 MED ORDER — SONAFINE EX EMUL
1.0000 "application " | Freq: Two times a day (BID) | CUTANEOUS | Status: DC
  Administered 2015-11-25: 1 via TOPICAL

## 2015-11-25 NOTE — Progress Notes (Addendum)
[name] [name] has completed 24 fractions to her left breast.  She reports having sharp pains in her left breast that are only relieved when she takes Oxycodone .   She reports having fatigue.  She is using sonafine and neosporin under her left breast.  The skin on her left breast is red with small peeling areas underneath her breast.  Using telfa under her breast.  New sonafine given. Wt Readings from Last 3 Encounters:  11/25/15 270 lb 6.4 oz (122.7 kg)  11/18/15 270 lb 9.6 oz (122.7 kg)  11/11/15 272 lb 9.6 oz (123.7 kg)  BP 137/87 (BP Location: Right Arm, Patient Position: Sitting, Cuff Size: Normal)   Pulse 93   Temp 98.5 F (36.9 C) (Oral)   Resp 18   Ht 5\' 1"  (1.549 m)   Wt 270 lb 6.4 oz (122.7 kg)   SpO2 100%   BMI 51.09 kg/m

## 2015-11-25 NOTE — Progress Notes (Signed)
  Radiation Oncology         [phone]) [phone] ________________________________  Name: [name] [name] MRN: [id number]  Date: 11/25/2015  DOB: May 18, 1950  Weekly Radiation Therapy Management    ICD-9-CM ICD-10-CM   1. Malignant neoplasm of upper-outer quadrant of left female breast, unspecified estrogen receptor status (HCC) 174.4 C50.412 SONAFINE emulsion 1 application        Current Dose: 43.2 Gy     Planned Dose:  60.4 Gy  Narrative . . . . . . . . The patient presents for routine under treatment assessment.                                   [name] [name] has completed 24 fractions to her left breast. She reports having sharp pains in her left breast that are only relieved when she takes Oxycodone .  She reports having fatigue. She is using sonafine and neosporin under her left breast. The skin on her left breast is red with small peeling areas underneath her breast. Using telfa under her breast. The patient reports taking around one percocet a day.                                    Set-up films were reviewed.                                 The chart was checked. Physical Findings. . .  height is 5\' 1"  (1.549 m) and weight is 270 lb 6.4 oz (122.7 kg). Her oral temperature is 98.5 F (36.9 C). Her blood pressure is 137/87 and her pulse is 93. Her respiration is 18 and oxygen saturation is 100%. . Weight essentially stable.  No significant changes.  The lungs are clear. The heart has a regular rhythm and rate. The left breast area shows some mild hyperpigmentation changes. Diffuse erythema along the left breast.  No skin breakdown.  Impression . . . . . . . The patient is tolerating radiation. Plan . . . . . . . . . . . . Continue treatment as planned. The patient was given another bottle of sonafine.  ________________________________   Blair Promise, PhD, MD  This document serves as a record of services personally performed by Gery Pray, MD. It was created on his behalf by Truddie Hidden, a trained medical scribe. The creation of this record is based on the scribe's personal observations and the provider's statements to them. This document has been checked and approved by the attending provider.

## 2015-11-26 ENCOUNTER — Ambulatory Visit
Admission: RE | Admit: 2015-11-26 | Discharge: 2015-11-26 | Disposition: A | Discharge status: Home / Self Care | Payer: Medicare Other | Source: Ambulatory Visit | Attending: Radiation Oncology | Admitting: Radiation Oncology

## 2015-11-26 DIAGNOSIS — C50412 Malignant neoplasm of upper-outer quadrant of left female breast: Secondary | ICD-10-CM | POA: Insufficient documentation

## 2015-11-26 DIAGNOSIS — Z510 Encounter for antineoplastic radiation therapy: Secondary | ICD-10-CM | POA: Insufficient documentation

## 2015-11-27 ENCOUNTER — Ambulatory Visit
Admission: RE | Admit: 2015-11-27 | Discharge: 2015-11-27 | Disposition: A | Discharge status: Home / Self Care | Payer: Medicare Other | Source: Ambulatory Visit | Attending: Radiation Oncology | Admitting: Radiation Oncology

## 2015-11-27 DIAGNOSIS — C50412 Malignant neoplasm of upper-outer quadrant of left female breast: Secondary | ICD-10-CM | POA: Diagnosis not present

## 2015-11-27 DIAGNOSIS — Z510 Encounter for antineoplastic radiation therapy: Secondary | ICD-10-CM | POA: Diagnosis not present

## 2015-11-28 ENCOUNTER — Ambulatory Visit
Admission: RE | Admit: 2015-11-28 | Discharge: 2015-11-28 | Disposition: A | Discharge status: Home / Self Care | Payer: Medicare Other | Source: Ambulatory Visit | Attending: Radiation Oncology | Admitting: Radiation Oncology

## 2015-11-28 ENCOUNTER — Telehealth: Payer: Self-pay | Admitting: Oncology

## 2015-11-28 DIAGNOSIS — Z510 Encounter for antineoplastic radiation therapy: Secondary | ICD-10-CM | POA: Diagnosis not present

## 2015-11-28 DIAGNOSIS — K7460 Unspecified cirrhosis of liver: Secondary | ICD-10-CM | POA: Diagnosis not present

## 2015-11-28 DIAGNOSIS — C50412 Malignant neoplasm of upper-outer quadrant of left female breast: Secondary | ICD-10-CM | POA: Diagnosis not present

## 2015-11-28 NOTE — Telephone Encounter (Signed)
Called [name] [name] regarding her reaction to percocet.  She said she took 1 tablet last night at bedtime and started itching all over her body at about 1 in the morning.  She said the itching is better now.  She denies having any trouble breathing.  Advised her to take benadryl as needed and to not take any more percocet.  She said she has Advil at home and is going to take that instead.  She is also going to look at what medication she took for her knee surgery and let us know.

## 2015-12-01 ENCOUNTER — Ambulatory Visit: Payer: Medicare Other

## 2015-12-01 DIAGNOSIS — Z510 Encounter for antineoplastic radiation therapy: Secondary | ICD-10-CM | POA: Diagnosis not present

## 2015-12-01 DIAGNOSIS — C50412 Malignant neoplasm of upper-outer quadrant of left female breast: Secondary | ICD-10-CM | POA: Diagnosis not present

## 2015-12-02 ENCOUNTER — Encounter: Payer: Self-pay | Admitting: Radiation Oncology

## 2015-12-02 ENCOUNTER — Ambulatory Visit: Payer: Medicare Other

## 2015-12-02 ENCOUNTER — Ambulatory Visit
Admission: RE | Admit: 2015-12-02 | Discharge: 2015-12-02 | Disposition: A | Discharge status: Home / Self Care | Payer: Medicare Other | Source: Ambulatory Visit | Attending: Radiation Oncology | Admitting: Radiation Oncology

## 2015-12-02 VITALS — BP 139/76 | HR 80 | Temp 98.0°F | Ht 61.0 in | Wt 271.4 lb

## 2015-12-02 DIAGNOSIS — C50412 Malignant neoplasm of upper-outer quadrant of left female breast: Secondary | ICD-10-CM | POA: Diagnosis not present

## 2015-12-02 DIAGNOSIS — Z510 Encounter for antineoplastic radiation therapy: Secondary | ICD-10-CM | POA: Diagnosis not present

## 2015-12-02 NOTE — Progress Notes (Signed)
  Radiation Oncology         [phone]) [phone] ________________________________  Name: [name] [name] MRN: [id number]  Date: 12/02/2015  DOB: 1950/12/10  Weekly Radiation Therapy Management    ICD-9-CM ICD-10-CM   1. Malignant neoplasm of upper-outer quadrant of left female breast, unspecified estrogen receptor status (HCC) 174.4 C50.412         Current Dose: 52.4 Gy     Planned Dose:  60.4 Gy  Narrative . . . . . . . . The patient presents for routine under treatment assessment.                                   [name] [name] has completed 29 fractions to her left breast. She reports having pain on her left breast that she rates 7/10. She is taking 2 percocet tablets per day. She did have itching spanning her entire body following the administration of the 2 tablets. Benadryl alleviated this itch.  Patient notes blistering along the side of her breast. Patient applies neosporin to this area. She reports fatigue. Patient is using Sonafine.                                  Set-up films were reviewed.                                 The chart was checked. Physical Findings. . .  height is 5\' 1"  (1.549 m) and weight is 271 lb 6.4 oz (123.1 kg). Her oral temperature is 98 F (36.7 C). Her blood pressure is 139/76 and her pulse is 80. Her oxygen saturation is 97%. . Weight essentially stable.  No significant changes.  The lungs are clear. The heart has a regular rhythm and rate.  The skin on her left breast is erythematous with blistering along the sides. No signs of infection Impression . . . . . . . The patient is tolerating radiation. Plan . . . . . . . . . . . . Continue treatment as planned.   ________________________________   Blair Promise, PhD, MD  This document serves as a record of services personally performed by Gery Pray, MD. It was created on his behalf by Bethann Humble, a trained medical scribe. The creation of this record is based on the scribe's personal observations and the  provider's statements to them. This document has been checked and approved by the attending provider.

## 2015-12-02 NOTE — Progress Notes (Addendum)
[name] [name] has completed 29 fractions to her left breast.  She reports having pain on her left breast at a 7/10.  She is taking about 2 percocet tablets per day.  She did have itching all over her body after taking 2 tablets but she tried taking a benadryl with the percocet and the itching went away.  She reports having fatigue.  She is using sonafine.  The skin on her left breast is red with blisters along the side of her breast.  She is applying neosporin plus pain to the blisters.  BP 139/76 (BP Location: Right Wrist, Patient Position: Sitting)   Pulse 80   Temp 98 F (36.7 C) (Oral)   Ht 5\' 1"  (1.549 m)   Wt 271 lb 6.4 oz (123.1 kg)   SpO2 97%   BMI 51.28 kg/m    Wt Readings from Last 3 Encounters:  12/02/15 271 lb 6.4 oz (123.1 kg)  11/25/15 270 lb 6.4 oz (122.7 kg)  11/18/15 270 lb 9.6 oz (122.7 kg)

## 2015-12-03 ENCOUNTER — Ambulatory Visit
Admission: RE | Admit: 2015-12-03 | Discharge: 2015-12-03 | Disposition: A | Discharge status: Home / Self Care | Payer: Medicare Other | Source: Ambulatory Visit | Attending: Radiation Oncology | Admitting: Radiation Oncology

## 2015-12-03 DIAGNOSIS — Z510 Encounter for antineoplastic radiation therapy: Secondary | ICD-10-CM | POA: Diagnosis not present

## 2015-12-03 DIAGNOSIS — C50412 Malignant neoplasm of upper-outer quadrant of left female breast: Secondary | ICD-10-CM | POA: Diagnosis not present

## 2015-12-04 ENCOUNTER — Encounter: Payer: Self-pay | Admitting: Radiation Oncology

## 2015-12-04 ENCOUNTER — Ambulatory Visit
Admission: RE | Admit: 2015-12-04 | Discharge: 2015-12-04 | Disposition: A | Discharge status: Home / Self Care | Payer: Medicare Other | Source: Ambulatory Visit | Attending: Radiation Oncology | Admitting: Radiation Oncology

## 2015-12-04 VITALS — BP 148/82 | HR 76 | Temp 97.8°F | Ht 61.0 in | Wt 270.0 lb

## 2015-12-04 DIAGNOSIS — Z510 Encounter for antineoplastic radiation therapy: Secondary | ICD-10-CM | POA: Diagnosis not present

## 2015-12-04 DIAGNOSIS — C50412 Malignant neoplasm of upper-outer quadrant of left female breast: Secondary | ICD-10-CM

## 2015-12-04 MED ORDER — OXYCODONE-ACETAMINOPHEN 5-325 MG PO TABS: 1.0000 | ORAL_TABLET | ORAL | 0 refills | Status: DC | PRN

## 2015-12-04 MED ORDER — SONAFINE EX EMUL
1.0000 "application " | Freq: Once | CUTANEOUS | Status: AC
  Administered 2015-12-04: 1 via TOPICAL

## 2015-12-04 NOTE — Progress Notes (Signed)
  Radiation Oncology         [id number]) [phone] ________________________________  Name: [name] [name] MRN: [id number]  Date: 12/04/2015  DOB: April 09, 1950  Weekly Radiation Therapy Management    ICD-9-CM ICD-10-CM   1. Malignant neoplasm of upper-outer quadrant of left female breast, unspecified estrogen receptor status (HCC) 174.4 C50.412 SONAFINE emulsion 1 application        Current Dose: 56.4 Gy     Planned Dose:  60.4 Gy  Narrative . . . . . . . . The patient presents for routine under treatment assessment.                                  [name] [name] has completed 31 fractions to her left breast. She reports having pain in her left breast that she is rating a 4/10. She reports the pain gets worse in the evening. She continues to take 2 to 3 percocet tablets per day and aleve as needed. She reports having fatigue. She is using sonafine and neosporin plus pain on her peeling areas. She has been given a refill of sonafine. Per nursing, the skin on her left breast is red with peeling areas under her breast.                                  Set-up films were reviewed.                                 The chart was checked. Physical Findings. . .  height is 5\' 1"  (1.549 m) and weight is 270 lb (122.5 kg). Her oral temperature is 97.8 F (36.6 C). Her blood pressure is 148/82 (abnormal) and her pulse is 76. Her oxygen saturation is 98%. . Weight essentially stable.  No significant changes.  The lungs are clear. The heart has a regular rhythm and rate. Healing areas of desquamation in the lower aspect of the left breast.  Impression . . . . . . . The patient is tolerating radiation. Plan . . . . . . . . . . . . Continue treatment as planned. I have refilled her percocet today.  ________________________________   Blair Promise, PhD, MD  This document serves as a record of services personally performed by Gery Pray, MD. It was created on his behalf by Arlyce Harman, a trained medical scribe.  The creation of this record is based on the scribe's personal observations and the provider's statements to them. This document has been checked and approved by the attending provider.

## 2015-12-04 NOTE — Addendum Note (Signed)
Encounter addended by: Jacqulyn Liner, RN on: 12/04/2015  4:23 PM<BR>    Actions taken: [facility] administration accepted

## 2015-12-04 NOTE — Addendum Note (Signed)
Encounter addended by: Jacqulyn Liner, RN on: 12/04/2015  4:24 PM<BR>    Actions taken: [facility] administration accepted, Sign clinical note

## 2015-12-04 NOTE — Progress Notes (Signed)
[name] [name] has completed 31 fractions to her left breast.  She reports having pain in her left breast that she is rating at a 4/10. She reports the pain gets worse in the evenings.  She continues to take 2-3 percocet tablets per day and aleve as needed.  She reports having fatigue.  She is using sonafine and neosporin plus pain on her peeling areas.  She has been given a refill of sonafine.  The skin on her left breast is red with peeling areas under her breast.  BP (!) 148/82 (BP Location: Right Wrist, Patient Position: Sitting)   Pulse 76   Temp 97.8 F (36.6 C) (Oral)   Ht 5\' 1"  (1.549 m)   Wt 270 lb (122.5 kg)   SpO2 98%   BMI 51.02 kg/m    Wt Readings from Last 3 Encounters:  12/04/15 270 lb (122.5 kg)  12/02/15 271 lb 6.4 oz (123.1 kg)  11/25/15 270 lb 6.4 oz (122.7 kg)

## 2015-12-05 ENCOUNTER — Ambulatory Visit
Admission: RE | Admit: 2015-12-05 | Discharge: 2015-12-05 | Disposition: A | Discharge status: Home / Self Care | Payer: Medicare Other | Source: Ambulatory Visit | Attending: Radiation Oncology | Admitting: Radiation Oncology

## 2015-12-05 ENCOUNTER — Ambulatory Visit: Payer: Medicare Other

## 2015-12-05 DIAGNOSIS — Z510 Encounter for antineoplastic radiation therapy: Secondary | ICD-10-CM | POA: Diagnosis not present

## 2015-12-05 DIAGNOSIS — C50412 Malignant neoplasm of upper-outer quadrant of left female breast: Secondary | ICD-10-CM | POA: Diagnosis not present

## 2015-12-08 ENCOUNTER — Ambulatory Visit
Admission: RE | Admit: 2015-12-08 | Discharge: 2015-12-08 | Disposition: A | Discharge status: Home / Self Care | Payer: Medicare Other | Source: Ambulatory Visit | Attending: Radiation Oncology | Admitting: Radiation Oncology

## 2015-12-08 ENCOUNTER — Ambulatory Visit: Payer: Medicare Other

## 2015-12-08 ENCOUNTER — Encounter: Payer: Self-pay | Admitting: *Deleted

## 2015-12-08 ENCOUNTER — Encounter: Payer: Self-pay | Admitting: Radiation Oncology

## 2015-12-08 DIAGNOSIS — Z510 Encounter for antineoplastic radiation therapy: Secondary | ICD-10-CM | POA: Diagnosis not present

## 2015-12-08 DIAGNOSIS — C50412 Malignant neoplasm of upper-outer quadrant of left female breast: Secondary | ICD-10-CM | POA: Diagnosis not present

## 2015-12-11 NOTE — Progress Notes (Signed)
  Radiation Oncology         [phone] ________________________________  Name: [name] [name] MRN: [id number]  Date: 12/08/2015  DOB: 1950/11/24  End of Treatment Note  Diagnosis:  Stage IA (pT1c, pN0) invasive ductal carcinoma and DCIS of the left breast (ER/PR +, HER2 -)  Indication for treatment: Curative and decrease the risk of recurrence       Radiation treatment dates:   10/22/15 - 12/08/15  Site/dose:   1) Left breast: 50.4 Gy in 28 fractions   2) Left breast boost: 10 Gy in 5 fractions  Beams/energy:   1) 3D // 10X, 15X Photon   2) Isodose Plan // 10X, 15X Photon  Narrative: The patient tolerated radiation treatment relatively well. Towards the end of treatment, the patient's main complaint was pain as a 4/10 in the left breast that got worse in the evening. She took 2-3 percocet tablets a day and aleve as needed. She also had fatigue. Healing areas of desquamation in the lower aspect of the left breast with hyperpigmentation changes and erythema. The patient also had small areas of breakdown in the left inframammary fold during the middle of treatment. We gave her Sonafine during treatment.  Plan: The patient has completed radiation treatment. The patient will return to radiation oncology clinic for routine followup in one month. I advised them to call or return sooner if they have any questions or concerns related to their recovery or treatment.  -----------------------------------  Blair Promise, PhD, MD  This document serves as a record of services personally performed by Gery Pray, MD. It was created on his behalf by Darcus Austin, a trained medical scribe. The creation of this record is based on the scribe's personal observations and the provider's statements to them. This document has been checked and approved by the attending provider.

## 2015-12-23 ENCOUNTER — Other Ambulatory Visit (HOSPITAL_BASED_OUTPATIENT_CLINIC_OR_DEPARTMENT_OTHER): Payer: Medicare Other

## 2015-12-23 ENCOUNTER — Ambulatory Visit (HOSPITAL_BASED_OUTPATIENT_CLINIC_OR_DEPARTMENT_OTHER): Payer: Medicare Other | Admitting: Oncology

## 2015-12-23 VITALS — BP 122/66 | HR 85 | Temp 98.1°F | Resp 18 | Ht 61.0 in | Wt 271.8 lb

## 2015-12-23 DIAGNOSIS — C50412 Malignant neoplasm of upper-outer quadrant of left female breast: Secondary | ICD-10-CM

## 2015-12-23 DIAGNOSIS — M2550 Pain in unspecified joint: Secondary | ICD-10-CM | POA: Diagnosis not present

## 2015-12-23 DIAGNOSIS — G8929 Other chronic pain: Secondary | ICD-10-CM | POA: Diagnosis not present

## 2015-12-23 DIAGNOSIS — M545 Low back pain: Secondary | ICD-10-CM | POA: Diagnosis not present

## 2015-12-23 DIAGNOSIS — Z170 Estrogen receptor positive status [ER+]: Secondary | ICD-10-CM | POA: Diagnosis not present

## 2015-12-23 LAB — CBC WITH DIFFERENTIAL/PLATELET
BASO%: 0.7 % (ref 0.0–2.0)
Basophils Absolute: 0 10*3/uL (ref 0.0–0.1)
EOS%: 3.6 % (ref 0.0–7.0)
Eosinophils Absolute: 0.2 10*3/uL (ref 0.0–0.5)
HCT: 38.7 % (ref 34.8–46.6)
HEMOGLOBIN: 12.6 g/dL (ref 11.6–15.9)
LYMPH%: 21.7 % (ref 14.0–49.7)
MCH: 29.7 pg (ref 25.1–34.0)
MCHC: 32.6 g/dL (ref 31.5–36.0)
MCV: 91.3 fL (ref 79.5–101.0)
MONO#: 0.3 10*3/uL (ref 0.1–0.9)
MONO%: 6.5 % (ref 0.0–14.0)
NEUT%: 67.5 % (ref 38.4–76.8)
NEUTROS ABS: 3 10*3/uL (ref 1.5–6.5)
Platelets: 95 10*3/uL — ABNORMAL LOW (ref 145–400)
RBC: 4.24 10*6/uL (ref 3.70–5.45)
RDW: 15 % — AB (ref 11.2–14.5)
WBC: 4.5 10*3/uL (ref 3.9–10.3)
lymph#: 1 10*3/uL (ref 0.9–3.3)

## 2015-12-23 LAB — CHCC SMEAR

## 2015-12-23 LAB — COMPREHENSIVE METABOLIC PANEL
ALBUMIN: 3.8 g/dL (ref 3.5–5.0)
ALT: 16 U/L (ref 0–55)
AST: 26 U/L (ref 5–34)
Alkaline Phosphatase: 94 U/L (ref 40–150)
Anion Gap: 11 mEq/L (ref 3–11)
BILIRUBIN TOTAL: 0.6 mg/dL (ref 0.20–1.20)
BUN: 17.3 mg/dL (ref 7.0–26.0)
CHLORIDE: 105 meq/L (ref 98–109)
CO2: 25 meq/L (ref 22–29)
CREATININE: 1 mg/dL (ref 0.6–1.1)
Calcium: 10.2 mg/dL (ref 8.4–10.4)
EGFR: 60 mL/min/{1.73_m2} — ABNORMAL LOW (ref >90)
Glucose: 188 mg/dl — ABNORMAL HIGH (ref 70–140)
POTASSIUM: 4 meq/L (ref 3.5–5.1)
SODIUM: 141 meq/L (ref 136–145)
TOTAL PROTEIN: 7.7 g/dL (ref 6.4–8.3)
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
## 2015-12-28 MED ORDER — ANASTROZOLE 1 MG PO TABS: 1.0000 mg | ORAL_TABLET | Freq: Every day | ORAL | 4 refills | Status: DC

## 2016-01-01 ENCOUNTER — Telehealth: Payer: Self-pay | Admitting: *Deleted

## 2016-01-01 NOTE — Telephone Encounter (Signed)
"  I received an appointment reminder call.  What is this appointment for."   Advised of the radiation F/U/

## 2016-01-02 ENCOUNTER — Encounter: Payer: Self-pay | Admitting: Oncology

## 2016-01-05 ENCOUNTER — Encounter: Payer: Self-pay | Admitting: Radiation Oncology

## 2016-01-05 ENCOUNTER — Ambulatory Visit
Admission: RE | Admit: 2016-01-05 | Discharge: 2016-01-05 | Disposition: A | Discharge status: Home / Self Care | Payer: Medicare Other | Source: Ambulatory Visit | Attending: Radiation Oncology | Admitting: Radiation Oncology

## 2016-01-05 DIAGNOSIS — Z170 Estrogen receptor positive status [ER+]: Secondary | ICD-10-CM | POA: Insufficient documentation

## 2016-01-05 DIAGNOSIS — C50412 Malignant neoplasm of upper-outer quadrant of left female breast: Secondary | ICD-10-CM | POA: Insufficient documentation

## 2016-01-05 DIAGNOSIS — Z79899 Other long term (current) drug therapy: Secondary | ICD-10-CM | POA: Diagnosis not present

## 2016-01-05 NOTE — Progress Notes (Signed)
Radiation Oncology         [phone]) [phone] ________________________________  Name: [name] [name] MRN: [id number]  Date: 01/05/2016  DOB: 1950/07/18  Follow-Up Visit Note  CC: FULP, CAMMIE, MD  [name], MD    ICD-9-CM ICD-10-CM   1. Malignant neoplasm of upper-outer quadrant of left breast in female, estrogen receptor positive ([place]) 174.4 C50.412    V86.0 Z17.0     Diagnosis:   Stage IA (pT1c, pN0) invasive ductal carcinoma and DCIS of the left breast (ER/PR +, HER2 -)  Interval Since Last Radiation: 1 month  10/22/15 - 12/08/15: 1) Left breast: 50.4 Gy in 28 fractions 2) Left breast boost: 10 Gy in 5 fractions  Narrative:  The patient returns today for routine follow-up. The patient followed up with Dr. Jana Hakim on 12/23/15 and he recommended starting Anastrozole on 02/02/15.  On review of systems: The patient reports pain on and off under her left arm and left breast. She reports her energy level is improving. The skin on her left breast is hyperpigmented and she continues to use Radiaplex.  ALLERGIES:  is allergic to influenza a (h1n1) monoval pf and codeine.  Meds: Current Outpatient Prescriptions  Medication Sig Dispense Refill  . acetaminophen (TYLENOL) 325 MG tablet Take 650 mg by mouth every 6 (six) hours as needed.    [name] allopurinol (ZYLOPRIM) 100 MG tablet Take 100 mg by mouth daily.    . citalopram (CELEXA) 20 MG tablet Take 20 mg by mouth daily.    . furosemide (LASIX) 20 MG tablet Take 20 mg by mouth daily as needed. For fluid/swelling    . gabapentin (NEURONTIN) 600 MG tablet Take 600-1,200 mg by mouth 3 (three) times daily. Take 1 in the morning, 1 in the afternoon and 2 at bedtime.    [name] ibuprofen (ADVIL,MOTRIN) 200 MG tablet Take 200 mg by mouth every 6 (six) hours as needed.    [name] levothyroxine (SYNTHROID, LEVOTHROID) 175 MCG tablet Take 1 tablet by mouth daily.  0  . Liraglutide (VICTOZA) 18 MG/3ML SOPN Inject 1.8 mg into the skin daily.    [name] loratadine  (CLARITIN) 10 MG tablet Take 10 mg by mouth daily.    [name] losartan (COZAAR) 50 MG tablet Take 1 tablet by mouth every morning.     . methocarbamol (ROBAXIN) 500 MG tablet Take 500 mg by mouth 4 (four) times daily.    [name] nystatin (MYCOSTATIN/NYSTOP) powder Apply topically 2 (two) times daily.    [name] anastrozole (ARIMIDEX) 1 MG tablet Take 1 tablet (1 mg total) by mouth daily. (Patient not taking: Reported on 01/05/2016) 90 tablet 4  . oxyCODONE-acetaminophen (PERCOCET/ROXICET) 5-325 MG tablet Take 1 tablet by mouth every 4 (four) hours as needed for severe pain. (Patient not taking: Reported on 01/05/2016) 30 tablet 0   No current facility-administered medications for this encounter.     Physical Findings: The patient is in no acute distress. Patient is alert and oriented.  height is '5\' 1"'$  (1.549 m) and weight is 274 lb 12.8 oz (124.6 kg). Her oral temperature is 97.8 F (36.6 C). Her blood pressure is 123/52 (abnormal) and her pulse is 83. Her oxygen saturation is 98%.   Lungs are clear to auscultation bilaterally. Heart has regular rate and rhythm. No palpable cervical, supraclavicular, or axillary adenopathy. Abdomen soft, non-tender, normal bowel sounds. Right breast no palpable mass or nipple discharge. Left breast hyperpigmentation changes throughout the breast, skin well healed, some edema in the nipple areolar complex, no  dominant mass appreciated in the breast, no nipple discharge or bleeding. No palpable nodule or cords in the left axilla, no swelling of the left axilla.  Lab Findings: Lab Results  Component Value Date   WBC 4.5 12/23/2015   HGB 12.6 12/23/2015   HCT 38.7 12/23/2015   MCV 91.3 12/23/2015   PLT 95 Large platelets present (L) 12/23/2015    Radiographic Findings: No results found.  Impression:  The patient is recovering well from radiation therapy. No evidence of recurrence on clinical exam.  Plan: The patient is scheduled to see Dr. [name], her surgeon tomorrow. She  will follow up with radiation oncology in 6 months. She will also follow up with Dr. Jana Hakim at a later date in regards to her Anastrozole maintenance. ____________________________________ -----------------------------------  Blair Promise, PhD, MD  This document serves as a record of services personally performed by Gery Pray, MD. It was created on his behalf by Darcus Austin, a trained medical scribe. The creation of this record is based on the scribe's personal observations and the provider's statements to them. This document has been checked and approved by the attending provider.

## 2016-01-05 NOTE — Progress Notes (Signed)
Cyril Nulty here for follow up after treatment to her left breast.  She reports having pain on and off under her left arm.  She reports her energy level is improving.  She will start taking anastrazole January 1st.  The skin on her left breast has hyperpigmentation.  She continues to use radiaplex.    BP (!) 123/52 (BP Location: Right Arm, Patient Position: Sitting)   Pulse 83   Temp 97.8 F (36.6 C) (Oral)   Ht 5\' 1"  (1.549 m)   Wt 274 lb 12.8 oz (124.6 kg)   SpO2 98%   BMI 51.92 kg/m    Wt Readings from Last 3 Encounters:  01/05/16 274 lb 12.8 oz (124.6 kg)  12/23/15 271 lb 12.8 oz (123.3 kg)  12/04/15 270 lb (122.5 kg)

## 2016-01-06 DIAGNOSIS — C50912 Malignant neoplasm of unspecified site of left female breast: Secondary | ICD-10-CM | POA: Diagnosis not present

## 2016-02-17 ENCOUNTER — Ambulatory Visit (INDEPENDENT_AMBULATORY_CARE_PROVIDER_SITE_OTHER): Payer: Medicare Other | Admitting: Orthopaedic Surgery

## 2016-02-17 ENCOUNTER — Ambulatory Visit (INDEPENDENT_AMBULATORY_CARE_PROVIDER_SITE_OTHER): Payer: Medicare Other

## 2016-02-17 DIAGNOSIS — Z96651 Presence of right artificial knee joint: Secondary | ICD-10-CM

## 2016-02-17 NOTE — Progress Notes (Signed)
Patient is now 1 year out from a right total knee arthroplasty. She says there is little bit of soreness to the knee but overall she is doing well. She is not using assistive device.  On examination of the right operative knee she has full range of motion of that knee. There is no significant crepitation no instability. An AP and lateral right knee shows a well-seated implant. There is no evidence of loosening of the implants. The overall I'm is well-maintained. There is no acute bony findings and no effusion.  At this point she'll continue increase her activities. She'll follow-up as needed for that right knee but understands we can see her for anything at any time if she is having any issues at all.

## 2016-03-16 DIAGNOSIS — E039 Hypothyroidism, unspecified: Secondary | ICD-10-CM | POA: Diagnosis not present

## 2016-03-16 DIAGNOSIS — C50912 Malignant neoplasm of unspecified site of left female breast: Secondary | ICD-10-CM | POA: Diagnosis not present

## 2016-03-16 DIAGNOSIS — G8929 Other chronic pain: Secondary | ICD-10-CM | POA: Diagnosis not present

## 2016-03-16 DIAGNOSIS — Z7984 Long term (current) use of oral hypoglycemic drugs: Secondary | ICD-10-CM | POA: Diagnosis not present

## 2016-03-16 DIAGNOSIS — K7460 Unspecified cirrhosis of liver: Secondary | ICD-10-CM | POA: Diagnosis not present

## 2016-03-16 DIAGNOSIS — E7800 Pure hypercholesterolemia, unspecified: Secondary | ICD-10-CM | POA: Diagnosis not present

## 2016-03-16 DIAGNOSIS — E1140 Type 2 diabetes mellitus with diabetic neuropathy, unspecified: Secondary | ICD-10-CM | POA: Diagnosis not present

## 2016-03-16 DIAGNOSIS — G4730 Sleep apnea, unspecified: Secondary | ICD-10-CM | POA: Diagnosis not present

## 2016-03-16 DIAGNOSIS — I10 Essential (primary) hypertension: Secondary | ICD-10-CM | POA: Diagnosis not present

## 2016-04-22 ENCOUNTER — Telehealth: Payer: Self-pay | Admitting: Oncology

## 2016-04-22 NOTE — Telephone Encounter (Signed)
lvm to inform pt of [facility] S[facility] appt per LOS

## 2016-04-28 ENCOUNTER — Other Ambulatory Visit (HOSPITAL_BASED_OUTPATIENT_CLINIC_OR_DEPARTMENT_OTHER): Payer: Medicare Other

## 2016-04-28 ENCOUNTER — Ambulatory Visit (HOSPITAL_BASED_OUTPATIENT_CLINIC_OR_DEPARTMENT_OTHER): Payer: Medicare Other | Admitting: Oncology

## 2016-04-28 VITALS — BP 140/73 | HR 76 | Temp 97.5°F | Resp 18 | Ht 61.0 in | Wt 273.1 lb

## 2016-04-28 DIAGNOSIS — Z170 Estrogen receptor positive status [ER+]: Secondary | ICD-10-CM

## 2016-04-28 DIAGNOSIS — C50412 Malignant neoplasm of upper-outer quadrant of left female breast: Secondary | ICD-10-CM | POA: Diagnosis not present

## 2016-04-28 DIAGNOSIS — Z79811 Long term (current) use of aromatase inhibitors: Secondary | ICD-10-CM | POA: Diagnosis not present

## 2016-04-28 LAB — CBC WITH DIFFERENTIAL/PLATELET
BASO%: 0.2 % (ref 0.0–2.0)
BASOS ABS: 0 10*3/uL (ref 0.0–0.1)
EOS%: 2.4 % (ref 0.0–7.0)
Eosinophils Absolute: 0.1 10*3/uL (ref 0.0–0.5)
HCT: 37.1 % (ref 34.8–46.6)
HGB: 12.2 g/dL (ref 11.6–15.9)
LYMPH#: 1 10*3/uL (ref 0.9–3.3)
LYMPH%: 23.9 % (ref 14.0–49.7)
MCH: 28.8 pg (ref 25.1–34.0)
MCHC: 32.9 g/dL (ref 31.5–36.0)
MCV: 87.5 fL (ref 79.5–101.0)
MONO#: 0.3 10*3/uL (ref 0.1–0.9)
MONO%: 6.3 % (ref 0.0–14.0)
NEUT#: 2.8 10*3/uL (ref 1.5–6.5)
NEUT%: 67.2 % (ref 38.4–76.8)
Platelets: 92 10*3/uL — ABNORMAL LOW (ref 145–400)
RBC: 4.24 10*6/uL (ref 3.70–5.45)
RDW: 14.2 % (ref 11.2–14.5)
WBC: 4.1 10*3/uL (ref 3.9–10.3)

## 2016-04-28 LAB — COMPREHENSIVE METABOLIC PANEL
ALT: 32 U/L (ref 0–55)
AST: 42 U/L — AB (ref 5–34)
Albumin: 3.9 g/dL (ref 3.5–5.0)
Alkaline Phosphatase: 84 U/L (ref 40–150)
Anion Gap: 13 mEq/L — ABNORMAL HIGH (ref 3–11)
BUN: 10.3 mg/dL (ref 7.0–26.0)
CALCIUM: 10.1 mg/dL (ref 8.4–10.4)
CO2: 23 mEq/L (ref 22–29)
CREATININE: 0.9 mg/dL (ref 0.6–1.1)
Chloride: 104 mEq/L (ref 98–109)
EGFR: 63 mL/min/{1.73_m2} — ABNORMAL LOW (ref >90)
Glucose: 202 mg/dl — ABNORMAL HIGH (ref 70–140)
POTASSIUM: 3.5 meq/L (ref 3.5–5.1)
Sodium: 140 mEq/L (ref 136–145)
Total Bilirubin: 0.54 mg/dL (ref 0.20–1.20)
Total Protein: 7.4 g/dL (ref 6.4–8.3)

## 2016-04-28 MED ORDER — GABAPENTIN 300 MG PO CAPS: 300.0000 mg | ORAL_CAPSULE | Freq: Every day | ORAL | 4 refills | Status: DC
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
## 2016-04-29 DIAGNOSIS — E038 Other specified hypothyroidism: Secondary | ICD-10-CM | POA: Diagnosis not present

## 2016-05-06 ENCOUNTER — Ambulatory Visit: Payer: Self-pay | Admitting: Radiation Oncology

## 2016-05-11 ENCOUNTER — Other Ambulatory Visit: Payer: Self-pay | Admitting: Gastroenterology

## 2016-05-11 DIAGNOSIS — K7469 Other cirrhosis of liver: Secondary | ICD-10-CM

## 2016-05-11 DIAGNOSIS — K7460 Unspecified cirrhosis of liver: Secondary | ICD-10-CM | POA: Diagnosis not present

## 2016-05-20 ENCOUNTER — Ambulatory Visit
Admission: RE | Admit: 2016-05-20 | Discharge: 2016-05-20 | Disposition: A | Discharge status: Home / Self Care | Payer: Medicare Other | Source: Ambulatory Visit | Attending: Radiation Oncology | Admitting: Radiation Oncology

## 2016-05-20 ENCOUNTER — Encounter: Payer: Self-pay | Admitting: Radiation Oncology

## 2016-05-20 VITALS — BP 138/72 | HR 83 | Temp 97.7°F | Ht 61.0 in | Wt 274.8 lb

## 2016-05-20 DIAGNOSIS — C50412 Malignant neoplasm of upper-outer quadrant of left female breast: Secondary | ICD-10-CM

## 2016-05-20 DIAGNOSIS — Z170 Estrogen receptor positive status [ER+]: Principal | ICD-10-CM

## 2016-05-20 DIAGNOSIS — Z08 Encounter for follow-up examination after completed treatment for malignant neoplasm: Secondary | ICD-10-CM | POA: Diagnosis not present

## 2016-05-20 DIAGNOSIS — D0512 Intraductal carcinoma in situ of left breast: Secondary | ICD-10-CM | POA: Diagnosis not present

## 2016-05-20 DIAGNOSIS — Z79811 Long term (current) use of aromatase inhibitors: Secondary | ICD-10-CM | POA: Diagnosis not present

## 2016-05-20 NOTE — Progress Notes (Signed)
[name] [name] is here for follow up after treatment to her left breast.  She reports having occasional sharp pains in her left breast.  She is taking Arimidex.  She reports feeling a "knot" in her left breast about 3 weeks ago and says its sore.  The skin on her left breast has slight hyperpigmentation.  She has a palpable lump in the upper portion of her breast.  BP 138/72 (BP Location: Right Arm, Patient Position: Sitting)   Pulse 83   Temp 97.7 F (36.5 C) (Oral)   Ht 5\' 1"  (1.549 m)   Wt 274 lb 12.8 oz (124.6 kg)   SpO2 97%   BMI 51.92 kg/m    Wt Readings from Last 3 Encounters:  05/20/16 274 lb 12.8 oz (124.6 kg)  04/28/16 273 lb 1.6 oz (123.9 kg)  01/05/16 274 lb 12.8 oz (124.6 kg)

## 2016-05-20 NOTE — Progress Notes (Signed)
Radiation Oncology         [id number]) [phone] ________________________________  Name: [name] [name] MRN: [id number]  Date: 05/20/2016  DOB: 1950/02/15   Follow-Up Visit Note  CC: Kandice Hams, MD  [name], MD    ICD-9-CM ICD-10-CM   1. Malignant neoplasm of upper-outer quadrant of left breast in female, estrogen receptor positive ([place]) 174.4 C50.412    V86.0 Z17.0     Diagnosis:   Stage IA (pT1c, pN0) invasive ductal carcinoma and DCIS of the left breast (ER/PR+, HER2-)  Interval Since Last Radiation: 5 months  10/22/15 - 12/08/15: 1) Left breast: 50.4 Gy in 28 fractions 2) Left breast boost: 10 Gy in 5 fractions  Narrative:  The patient returns today for routine follow-up. Pt reports of occasional sharp pains in her left breast. She endorses Arimidex.   Pt also complains of a knot in her left breast presenting about 3 weeks ago. She endorses accompanying soreness.   ALLERGIES:  is allergic to influenza a (h1n1) monoval pf and codeine.  Meds: Current Outpatient Prescriptions  Medication Sig Dispense Refill  . acetaminophen (TYLENOL) 325 MG tablet Take 650 mg by mouth every 6 (six) hours as needed.    [name] anastrozole (ARIMIDEX) 1 MG tablet Take 1 tablet (1 mg total) by mouth daily. 90 tablet 4  . citalopram (CELEXA) 20 MG tablet TAKE 1 TABLET BY MOUTH ONCE A DAY FOR ANXIETY  1  . furosemide (LASIX) 20 MG tablet Take 20 mg by mouth daily as needed.  4  . gabapentin (NEURONTIN) 300 MG capsule Take 1 capsule (300 mg total) by mouth at bedtime. 90 capsule 4  . ibuprofen (ADVIL,MOTRIN) 200 MG tablet Take 200 mg by mouth every 6 (six) hours as needed.    [name] levothyroxine (SYNTHROID, LEVOTHROID) 175 MCG tablet Take 1 tablet by mouth daily.  0  . Liraglutide (VICTOZA) 18 MG/3ML SOPN Inject 1.8 mg into the skin daily.    [name] loratadine (CLARITIN) 10 MG tablet Take 10 mg by mouth daily.    [name] losartan (COZAAR) 50 MG tablet Take 1 tablet by mouth every morning.     . methocarbamol  (ROBAXIN) 500 MG tablet Take 500 mg by mouth 4 (four) times daily.    [name] nystatin (MYCOSTATIN/NYSTOP) powder Apply topically 2 (two) times daily.     No current facility-administered medications for this encounter.    Physical Findings: The patient is in no acute distress. Patient is alert and oriented.  height is _0  (1.549 m) and weight is 274 lb 12.8 oz (124.6 kg). Her oral temperature is 97.7 F (36.5 C). Her blood pressure is 138/72 and her pulse is 83. Her oxygen saturation is 97%.   Lungs are clear to auscultation bilaterally. Heart has regular rate and rhythm. No palpable cervical, supraclavicular, or axillary adenopathy. Abdomen soft, non-tender, normal bowel sounds.  Right breast has no palpable masses or nipple discharge. Left breast shows hyperpigmentation changes throughout the breast. Skin is well healed. Fibrocystic changes in the upper left breast. Somewhat uncomfortable with palpation. No nipple discharge or bleeding.    Lab Findings: Lab Results  Component Value Date   WBC 4.1 04/28/2016   HGB 12.2 04/28/2016   HCT 37.1 04/28/2016   MCV 87.5 04/28/2016   PLT 92 (L) 04/28/2016    Radiographic Findings: No results found.  Impression: No evidence of recurrence on clinical exam.   Plan: Prn f/u in radiation oncology. She will continue to f/u with Dr. [name] and Dr.  Magrinat. Pt is due for her yearly mammogram later this month.  ____________________________________ -----------------------------------  Blair Promise, PhD, MD  This document serves as a record of services personally performed by Gery Pray, MD. It was created on his behalf by Linward Natal, a trained medical scribe. The creation of this record is based on the scribe's personal observations and the provider's statements to them. This document has been checked and approved by the attending provider.

## 2016-05-24 ENCOUNTER — Ambulatory Visit
Admission: RE | Admit: 2016-05-24 | Discharge: 2016-05-24 | Disposition: A | Discharge status: Home / Self Care | Payer: Medicare Other | Source: Ambulatory Visit | Attending: Gastroenterology | Admitting: Gastroenterology

## 2016-05-24 DIAGNOSIS — K7469 Other cirrhosis of liver: Secondary | ICD-10-CM

## 2016-05-24 DIAGNOSIS — K7460 Unspecified cirrhosis of liver: Secondary | ICD-10-CM | POA: Diagnosis not present

## 2016-06-03 ENCOUNTER — Encounter (INDEPENDENT_AMBULATORY_CARE_PROVIDER_SITE_OTHER): Payer: Self-pay | Admitting: Orthopedic Surgery

## 2016-06-03 ENCOUNTER — Ambulatory Visit (INDEPENDENT_AMBULATORY_CARE_PROVIDER_SITE_OTHER): Payer: Medicare Other | Admitting: Orthopedic Surgery

## 2016-06-03 VITALS — Ht 61.0 in | Wt 274.0 lb

## 2016-06-03 DIAGNOSIS — M6702 Short Achilles tendon (acquired), left ankle: Secondary | ICD-10-CM

## 2016-06-03 DIAGNOSIS — M6701 Short Achilles tendon (acquired), right ankle: Secondary | ICD-10-CM | POA: Diagnosis not present

## 2016-06-03 DIAGNOSIS — E1142 Type 2 diabetes mellitus with diabetic polyneuropathy: Secondary | ICD-10-CM | POA: Diagnosis not present

## 2016-06-03 HISTORY — DX: Short Achilles tendon (acquired), left ankle: M67.02

## 2016-06-03 HISTORY — DX: Short Achilles tendon (acquired), right ankle: M67.01

## 2016-06-03 NOTE — Progress Notes (Signed)
Office Visit Note   Patient: [name] [name]           Date of Birth: 1950-08-08           MRN: [id number] Visit Date: 06/03/2016              Requested by: Seward Carol, MD [address] [place], [place] [address] PCP: Kandice Hams, MD  No chief complaint on file.     HPI: Patient is a 66 year old woman diabetic insensate neuropathy currently on Neurontin and complains of a painful callus medial border left great toe.  Assessment & Plan: Visit Diagnoses:  1. Diabetic polyneuropathy associated with type 2 diabetes mellitus ([place])   2. Achilles tendon contracture, bilateral     Plan: Callus was  Complications there is no signs of infection. Patient was given instructions and demonstrated heel cord stretching. She'll get a pair of new balance walking sneakers. Avoid flip flops. Discussed the risk of ulceration risk of toe amputation.  Follow-Up Instructions: Return if symptoms worsen or fail to improve.   Ortho Exam  Patient is alert, oriented, no adenopathy, well-dressed, normal affect, normal respiratory effort. Examination she does have an unsteady antalgic wide-based gait. She has good pulses bilaterally good hair growth on her feet. She does have heel cord contracture bilaterally with dorsiflexion only to neutral with her knee extended. She has a callus over the medial border of the left great toe. After informed consent the callus was pared there is no ulcers no abscess no paronychial infection.  Imaging: No results found.  Labs: Lab Results  Component Value Date   HGBA1C 6.6 (H) 02/21/2015   REPTSTATUS 11/02/2006 FINAL 10/31/2006   CULT INSIGNIFICANT GROWTH 10/31/2006    Orders:  No orders of the defined types were placed in this encounter.  No orders of the defined types were placed in this encounter.    Procedures: No procedures performed  Clinical Data: No additional findings.  ROS:  All other systems negative, except as noted in the  HPI. Review of Systems  Objective: Vital Signs: Ht 5\' 1"  (1.549 m)   Wt 274 lb (124.3 kg)   BMI 51.77 kg/m   Specialty Comments:  No specialty comments available.  PMFS History: Patient Active Problem List   Diagnosis Date Noted  . Diabetic polyneuropathy associated with type 2 diabetes mellitus ([place]) 06/03/2016  . Achilles tendon contracture, bilateral 06/03/2016  . Breast cancer of upper-outer quadrant of left female breast ([place]) 08/19/2015  . Discharge from left nipple 08/03/2015  . Abnormal mammogram of left breast 08/03/2015  . Osteoarthritis of right knee 02/28/2015  . Status post total right knee replacement 02/28/2015  . Low TSH level 08/02/2014  . Splenomegaly 07/30/2014  . Thrombocytopenia ([place]) 07/30/2014  . 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 Recurrent falls 01/12/2013  . Neuropathy, diabetic ([place]) 01/12/2013  . Morbid obesity ([place]) 01/12/2013  . Degenerative arthritis of spine 01/12/2013  . Knee pain 01/12/2013  . Cough 09/05/2012  . HBP (high blood pressure) 09/05/2012  . Chest wall contusion 12/09/2011  . Abdominal wall contusion 12/09/2011   Past Medical History:  Diagnosis Date  . Anxiety   . Arthritis   . Blood dyscrasia    low platelet count- sees [name] , [name] ( Note in EPIC from 07/2014) at [facility]  . Cough   . Diabetes mellitus without complication ([place])   . Diabetic neuropathy ([place])   . Gout   . Gout   .  History of radiation therapy 10/22/15 - 12/08/15   left breast 50.4 Gy, boost to 10 Gy  . Hypertension   . Migraine   . 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 Neuromuscular disorder ([place])    diabetic neuropathy  . Neuropathy   . Sleep apnea    CPAP nightly  . Thyroid disease     Family History  Problem Relation Age of Onset  . Bone cancer Paternal Grandmother   . Heart failure Paternal Grandmother   . Cancer Maternal Grandmother   . Heart failure Maternal Grandfather   . Cancer Sister      GYN cancer    Past Surgical History:  Procedure Laterality Date  . BREAST BIOPSY Left 08/04/2015   Procedure: LEFT BREAST BIOPSY WITH NEEDLE LOCALIZATION;  Surgeon: [name], MD;  Location: [facility];  Service: General;  Laterality: Left;  . BREAST DUCTAL SYSTEM EXCISION Left 08/04/2015   Procedure: LEFT EXCISION DUCTAL SYSTEM BREAST;  Surgeon: [name], MD;  Location: [facility];  Service: General;  Laterality: Left;  . BREAST LUMPECTOMY WITH AXILLARY LYMPH NODE BIOPSY Left 09/12/2015   Procedure: RE-EXCISION OF LEFT BREAST LUMPECTOMY WITH LEFT AXILLARY LYMPH NODE BIOPSY;  Surgeon: [name], MD;  Location: [facility];  Service: General;  Laterality: Left;  . CHOLECYSTECTOMY    . JOINT REPLACEMENT     left knee  . TOTAL KNEE ARTHROPLASTY Right 02/28/2015   Procedure: RIGHT TOTAL KNEE ARTHROPLASTY;  Surgeon: [name], MD;  Location[facility];  Service: Orthopedics;  Laterality: Right;  . TUBAL LIGATION     Social History   Occupational History  . Retired     [place] History Main Topics  . Smoking status: Never Smoker  . Smokeless tobacco: Never Used  . Alcohol use No  . Drug use: No  . Sexual activity: Not on file

## 2016-06-23 ENCOUNTER — Encounter: Payer: Medicare Other | Admitting: Adult Health

## 2016-09-01 ENCOUNTER — Ambulatory Visit (INDEPENDENT_AMBULATORY_CARE_PROVIDER_SITE_OTHER): Payer: Medicare Other | Admitting: Physician Assistant

## 2016-09-01 ENCOUNTER — Encounter: Payer: Self-pay | Admitting: Physician Assistant

## 2016-09-01 VITALS — BP 122/70 | HR 75 | Temp 97.7°F | Resp 16 | Ht 61.0 in | Wt 275.2 lb

## 2016-09-01 DIAGNOSIS — E039 Hypothyroidism, unspecified: Secondary | ICD-10-CM

## 2016-09-01 DIAGNOSIS — F329 Major depressive disorder, single episode, unspecified: Secondary | ICD-10-CM

## 2016-09-01 DIAGNOSIS — R600 Localized edema: Secondary | ICD-10-CM | POA: Diagnosis not present

## 2016-09-01 DIAGNOSIS — I10 Essential (primary) hypertension: Secondary | ICD-10-CM | POA: Diagnosis not present

## 2016-09-01 DIAGNOSIS — F32A Depression, unspecified: Secondary | ICD-10-CM

## 2016-09-01 DIAGNOSIS — E1142 Type 2 diabetes mellitus with diabetic polyneuropathy: Secondary | ICD-10-CM

## 2016-09-01 DIAGNOSIS — F419 Anxiety disorder, unspecified: Secondary | ICD-10-CM | POA: Diagnosis not present

## 2016-09-01 LAB — TSH: TSH: 1.4 m[IU]/L

## 2016-09-01 NOTE — Progress Notes (Signed)
Patient ID: [name] [name] MRN: [id number], DOB: [name] 10, 1952, 66 y.o. Date of Encounter: @DATE @  Chief Complaint:  Chief Complaint  Patient presents with  . New Patient (Initial Visit)    HPI: 66 y.o. year old female  presents as a New Pt to [facility].   Her husband is also on my schedule for later today as a new patient to establish care. Pt reports that they received a letter that their primary PCP had left. Even tried a new doctor. Did not like him so there is now establishing care here. She reports that also they live very close by and they have family members who come here. Also notes that they have land near [place]. Samella Parr in-laws.  Reviewed that she sees the following specialists to manage the following medical conditions:  Dr. Ninfa Linden is in the same group as Dr. Sharol Given-- [facility]. She sees him for her knees etc. Dr. Sharol Given at [facility]--- for her feet Dr. Mariah Milling Dr. Blanchard Mane radiation  Dr. Raelyn Mora---- Endocrinology--But they ONLY MANAGE HER DIABETES----  says that they have her in a research study -----------------They do NOT do her THYROID Dr. Dia Sitter her liver and spleen"    I then discussed her remaining medical problems which I will manage:  She states that she has been on the Celexa for about 10 years for anxiety and depression.  Says that in the past she was in therapy with Lajuana Ripple. Also says that she was on a lot of medicines for this in the past. Feels that the Celexa is working well and is completely controlling her symptoms. She does note that she has had the following 3 stressors just over the past one year: -66 year old Aunt who has cancer is now living with her -Her husband has been diagnosed with rheumatoid arthritis -She was diagnosed with her and cancer March 2017 However despite all of these stressors says that she does feel that her anxiety and depression are controlled and stable.  She reports that she takes  Lasix 20 mg every single day for the past 1-2 years. States that she takes this for lower extremity edema. Has no known history of heart failure.  She reports that she takes the gabapentin for neuropathy. Says that "it is bad in her feet and now is also having in her fingers". Does see Dr. Sharol Given -- as well-- for her feet.  She is on thyroid medication child will monitor. This is stable in regards to symptoms.  She is on losartan for blood pressure which I will monitor. She is having no adverse effects with this medicine is causing the lightheadedness.  She has no specific concerns to address today.        Past Medical History:  Diagnosis Date  . Anxiety   . Arthritis   . Blood dyscrasia    low platelet count- sees [name] , [name] ( Note in EPIC from 07/2014) at [facility])    left breast  . Cough   . Diabetes mellitus without complication ([place])   . Diabetic neuropathy ([place])   . Gout   . Gout   . History of radiation therapy 10/22/15 - 12/08/15   left breast 50.4 Gy, boost to 10 Gy  . Hypertension   . Migraine   . 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 Neuromuscular disorder ([place])    diabetic neuropathy  . Neuropathy   . Sleep apnea    CPAP nightly  .  Thyroid disease      Home Meds: Outpatient Medications Prior to Visit  Medication Sig Dispense Refill  . acetaminophen (TYLENOL) 325 MG tablet Take 650 mg by mouth every 6 (six) hours as needed.    [name] anastrozole (ARIMIDEX) 1 MG tablet Take 1 tablet (1 mg total) by mouth daily. 90 tablet 4  . citalopram (CELEXA) 20 MG tablet TAKE 1 TABLET BY MOUTH ONCE A DAY FOR ANXIETY  1  . furosemide (LASIX) 20 MG tablet Take 20 mg by mouth daily as needed.  4  . ibuprofen (ADVIL,MOTRIN) 200 MG tablet Take 200 mg by mouth every 6 (six) hours as needed.    . Liraglutide (VICTOZA) 18 MG/3ML SOPN Inject 1.8 mg into the skin daily.    [name] loratadine (CLARITIN) 10 MG tablet Take 10 mg by mouth daily.     [name] losartan (COZAAR) 50 MG tablet Take 1 tablet by mouth every morning.     . methocarbamol (ROBAXIN) 500 MG tablet Take 500 mg by mouth 4 (four) times daily.    [name] nystatin (MYCOSTATIN/NYSTOP) powder Apply topically as needed.     . gabapentin (NEURONTIN) 300 MG capsule Take 1 capsule (300 mg total) by mouth at bedtime. (Patient taking differently: Take 300 mg by mouth 4 (four) times daily. ) 90 capsule 4  . levothyroxine (SYNTHROID, LEVOTHROID) 175 MCG tablet Take 1 tablet by mouth daily.  0   No facility-administered medications prior to visit.     Allergies:  Allergies  Allergen Reactions  . Influenza A (H1n1) [place] Pf Anaphylaxis  . Codeine Swelling    Social History   Social History  . Marital status: Married    Spouse name: N/A  . Number of children: 3  . Years of education: N/A   Occupational History  . Retired     [place] History Main Topics  . Smoking status: Never Smoker  . Smokeless tobacco: Never Used  . Alcohol use No  . Drug use: No  . Sexual activity: Not on file   Other Topics Concern  . Not on file   Social History Narrative  . No narrative on file    Family History  Problem Relation Age of Onset  . Bone cancer Paternal Grandmother   . Heart failure Paternal Grandmother   . Cancer Maternal Grandmother   . Heart failure Maternal Grandfather   . Cancer Sister        GYN cancer     Review of Systems:  See HPI for pertinent ROS. All other ROS negative.    Physical Exam: Blood pressure 122/70, pulse 75, temperature 97.7 F (36.5 C), temperature source Oral, resp. rate 16, height 5\' 1"  (1.549 m), weight 275 lb 3.2 oz (124.8 kg), SpO2 96 %., Body mass index is 52 kg/m. General: Obese WF.Appears in no acute distress. Neck: Supple. No thyromegaly. No lymphadenopathy. No carotid bruits. Lungs: Clear bilaterally to auscultation without wheezes, rales, or rhonchi. Breathing is unlabored. Heart: RRR with S1 S2. No murmurs,  rubs, or gallops. Abdomen: Soft, non-tender, non-distended with normoactive bowel sounds. No hepatomegaly. No rebound/guarding. No obvious abdominal masses. Musculoskeletal:  Strength and tone normal for age. Extremities/Skin: Warm and dry.  No LE edema. Neuro: Alert and oriented X 3. Moves all extremities spontaneously. Gait is normal. CNII-XII grossly in tact. Psych:  Responds to questions appropriately with a normal affect.     ASSESSMENT AND PLAN:  66 y.o. year old female with   1.  Anxiety and depression Stable/controlled. Continue current dose of Celexa.  2. Essential hypertension At goal. Continue current dose of medications. Check lab to monitor. - BASIC METABOLIC PANEL WITH GFR  3. Diabetic polyneuropathy associated with type 2 diabetes mellitus (HCC) She'll continue her current dose of gabapentin and will continue follow-up with Dr. Sharol Given  4. Bilateral lower extremity edema This is controlled. Continue current dose of Lasix. Check lab to monitor. - BASIC METABOLIC PANEL WITH GFR  5. Hypothyroidism, unspecified type She is on levothyroxine. Check TSH to monitor. - TSH  6. Preventative care  Cancer screenings: Managed by oncology Immunizations: She reports that she knows that she received a pneumonia vaccine and a tetanus vaccine when she had her knee replacement about one year ago. She will try to get a report/record of this so that we can enter this into computer. She does not want to get a shingles vaccine. She says that she has had allergic reaction to flu shot in the past and will not be getting any further flu shots.   Discussed with patient that at this point everything seems to be well controlled and managed. Therefore feel that she can wait 6 months for follow-up routine visit. However she can return for follow-up sooner if feels that this is needed or for any acute illnesses in the interim.   Signed, [address] [place], [place], [facility] 09/01/2016 9:07 AM

## 2016-09-02 LAB — BASIC METABOLIC PANEL WITH GFR
BUN: 21 mg/dL (ref 7–25)
CALCIUM: 9.8 mg/dL (ref 8.6–10.4)
CO2: 18 mmol/L — ABNORMAL LOW (ref 20–31)
Chloride: 103 mmol/L (ref 98–110)
Creat: 1.04 mg/dL — ABNORMAL HIGH (ref 0.50–0.99)
GFR, EST AFRICAN AMERICAN: 65 mL/min (ref >=60)
GFR, EST NON AFRICAN AMERICAN: 56 mL/min — AB (ref >=60)
Glucose, Bld: 285 mg/dL — ABNORMAL HIGH (ref 70–99)
POTASSIUM: 3.9 mmol/L (ref 3.5–5.3)
Sodium: 140 mmol/L (ref 135–146)

## 2016-09-08 DIAGNOSIS — C50912 Malignant neoplasm of unspecified site of left female breast: Secondary | ICD-10-CM | POA: Diagnosis not present

## 2016-09-08 DIAGNOSIS — N63 Unspecified lump in unspecified breast: Secondary | ICD-10-CM | POA: Diagnosis not present

## 2016-09-09 ENCOUNTER — Other Ambulatory Visit: Payer: Self-pay | Admitting: Surgery

## 2016-09-09 DIAGNOSIS — N63 Unspecified lump in unspecified breast: Secondary | ICD-10-CM

## 2016-09-14 ENCOUNTER — Ambulatory Visit
Admission: RE | Admit: 2016-09-14 | Discharge: 2016-09-14 | Disposition: A | Discharge status: Home / Self Care | Payer: Medicare Other | Source: Ambulatory Visit | Attending: Surgery | Admitting: Surgery

## 2016-09-14 ENCOUNTER — Other Ambulatory Visit: Payer: Self-pay | Admitting: Surgery

## 2016-09-14 DIAGNOSIS — N63 Unspecified lump in unspecified breast: Secondary | ICD-10-CM

## 2016-09-14 DIAGNOSIS — N6489 Other specified disorders of breast: Secondary | ICD-10-CM | POA: Diagnosis not present

## 2016-09-14 DIAGNOSIS — R928 Other abnormal and inconclusive findings on diagnostic imaging of breast: Secondary | ICD-10-CM | POA: Diagnosis not present

## 2016-09-14 DIAGNOSIS — N6310 Unspecified lump in the right breast, unspecified quadrant: Secondary | ICD-10-CM

## 2016-09-14 DIAGNOSIS — N6320 Unspecified lump in the left breast, unspecified quadrant: Secondary | ICD-10-CM

## 2016-09-14 HISTORY — DX: Personal history of irradiation: Z92.3

## 2016-09-14 HISTORY — DX: Malignant neoplasm of unspecified site of unspecified female breast: C50.919

## 2016-09-27 ENCOUNTER — Other Ambulatory Visit: Payer: Self-pay | Admitting: Physician Assistant

## 2016-09-27 NOTE — Telephone Encounter (Signed)
Refill appropriate 

## 2016-10-06 ENCOUNTER — Other Ambulatory Visit (HOSPITAL_BASED_OUTPATIENT_CLINIC_OR_DEPARTMENT_OTHER): Payer: Medicare Other

## 2016-10-06 ENCOUNTER — Ambulatory Visit (HOSPITAL_BASED_OUTPATIENT_CLINIC_OR_DEPARTMENT_OTHER): Payer: Medicare Other | Admitting: Oncology

## 2016-10-06 VITALS — BP 121/43 | HR 81 | Temp 98.1°F | Resp 18 | Ht 61.0 in | Wt 276.1 lb

## 2016-10-06 DIAGNOSIS — C50412 Malignant neoplasm of upper-outer quadrant of left female breast: Secondary | ICD-10-CM | POA: Diagnosis not present

## 2016-10-06 DIAGNOSIS — Z170 Estrogen receptor positive status [ER+]: Secondary | ICD-10-CM | POA: Diagnosis not present

## 2016-10-06 LAB — CBC WITH DIFFERENTIAL/PLATELET
BASO%: 0.3 % (ref 0.0–2.0)
BASOS ABS: 0 10*3/uL (ref 0.0–0.1)
EOS%: 4.2 % (ref 0.0–7.0)
Eosinophils Absolute: 0.1 10*3/uL (ref 0.0–0.5)
HCT: 36.1 % (ref 34.8–46.6)
HEMOGLOBIN: 11.6 g/dL (ref 11.6–15.9)
LYMPH#: 0.8 10*3/uL — AB (ref 0.9–3.3)
LYMPH%: 23.8 % (ref 14.0–49.7)
MCH: 29.1 pg (ref 25.1–34.0)
MCHC: 32.1 g/dL (ref 31.5–36.0)
MCV: 90.7 fL (ref 79.5–101.0)
MONO#: 0.2 10*3/uL (ref 0.1–0.9)
MONO%: 7.1 % (ref 0.0–14.0)
NEUT#: 2.2 10*3/uL (ref 1.5–6.5)
NEUT%: 64.6 % (ref 38.4–76.8)
Platelets: 65 10*3/uL — ABNORMAL LOW (ref 145–400)
RBC: 3.98 10*6/uL (ref 3.70–5.45)
RDW: 15.2 % — AB (ref 11.2–14.5)
WBC: 3.4 10*3/uL — ABNORMAL LOW (ref 3.9–10.3)

## 2016-10-06 LAB — COMPREHENSIVE METABOLIC PANEL
ALBUMIN: 3.9 g/dL (ref 3.5–5.0)
ALK PHOS: 86 U/L (ref 40–150)
ALT: 20 U/L (ref 0–55)
AST: 33 U/L (ref 5–34)
Anion Gap: 13 mEq/L — ABNORMAL HIGH (ref 3–11)
BUN: 16.9 mg/dL (ref 7.0–26.0)
CALCIUM: 10.4 mg/dL (ref 8.4–10.4)
CO2: 25 mEq/L (ref 22–29)
CREATININE: 1.1 mg/dL (ref 0.6–1.1)
Chloride: 104 mEq/L (ref 98–109)
EGFR: 50 mL/min/{1.73_m2} — ABNORMAL LOW (ref >90)
Glucose: 179 mg/dl — ABNORMAL HIGH (ref 70–140)
POTASSIUM: 3.8 meq/L (ref 3.5–5.1)
Sodium: 142 mEq/L (ref 136–145)
Total Bilirubin: 0.52 mg/dL (ref 0.20–1.20)
Total Protein: 7.6 g/dL (ref 6.4–8.3)
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
## 2016-10-19 ENCOUNTER — Telehealth: Payer: Self-pay | Admitting: Oncology

## 2016-10-19 NOTE — Telephone Encounter (Signed)
Left voicemail for patient regarding her appts that were changed per 9/15 sch msg.

## 2016-11-08 ENCOUNTER — Ambulatory Visit (INDEPENDENT_AMBULATORY_CARE_PROVIDER_SITE_OTHER): Payer: Medicare Other | Admitting: Physician Assistant

## 2016-11-08 ENCOUNTER — Encounter (INDEPENDENT_AMBULATORY_CARE_PROVIDER_SITE_OTHER): Payer: Self-pay | Admitting: Physician Assistant

## 2016-11-08 ENCOUNTER — Ambulatory Visit (INDEPENDENT_AMBULATORY_CARE_PROVIDER_SITE_OTHER): Payer: Medicare Other

## 2016-11-08 VITALS — Ht 60.0 in | Wt 264.0 lb

## 2016-11-08 DIAGNOSIS — M7062 Trochanteric bursitis, left hip: Secondary | ICD-10-CM

## 2016-11-08 DIAGNOSIS — M25562 Pain in left knee: Secondary | ICD-10-CM | POA: Diagnosis not present

## 2016-11-08 DIAGNOSIS — M25572 Pain in left ankle and joints of left foot: Secondary | ICD-10-CM

## 2016-11-09 ENCOUNTER — Other Ambulatory Visit (INDEPENDENT_AMBULATORY_CARE_PROVIDER_SITE_OTHER): Payer: Self-pay

## 2016-11-09 DIAGNOSIS — M25572 Pain in left ankle and joints of left foot: Secondary | ICD-10-CM | POA: Diagnosis not present

## 2016-11-09 DIAGNOSIS — M7062 Trochanteric bursitis, left hip: Secondary | ICD-10-CM | POA: Diagnosis not present

## 2016-11-09 DIAGNOSIS — M25562 Pain in left knee: Secondary | ICD-10-CM | POA: Diagnosis not present

## 2016-11-09 MED ORDER — METHYLPREDNISOLONE ACETATE 40 MG/ML IJ SUSP
40.0000 mg | INTRAMUSCULAR | Status: AC | PRN
  Administered 2016-11-09: 40 mg via INTRA_ARTICULAR

## 2016-11-09 MED ORDER — LIDOCAINE HCL 1 % IJ SOLN
3.0000 mL | INTRAMUSCULAR | Status: AC | PRN
  Administered 2016-11-09: 3 mL

## 2016-11-09 NOTE — Progress Notes (Signed)
Office Visit Note   Patient: [name] [name]           Date of Birth: 01-May-1950           MRN: [id number] Visit Date: 11/08/2016              Requested by: Orlena Sheldon, PA-C [address] [place], [place] [address] PCP: Orlena Sheldon, PA-C   Assessment & Plan: Visit Diagnoses:  1. Pain in left ankle and joints of left foot   2. Acute pain of left knee     Plan: Left ankle weightbearing as tolerated. ASO brace which she will obtain off-the-shelf. Quad strengthening. IT band stretching.  Follow-Up Instructions: Return if symptoms worsen or fail to improve.   Orders:  Orders Placed This Encounter  Procedures  . Large Joint Injection/Arthrocentesis  . XR Knee 1-2 Views Left  . XR Ankle Complete Left   No orders of the defined types were placed in this encounter.     Procedures: Large Joint Inj Date/Time: 11/09/2016 9:54 AM Performed by: Pete Pelt Authorized by: Pete Pelt   Consent Given by:  Patient Indications:  Pain Location:  Hip Site:  L greater trochanter Needle Size:  22 G Needle Length:  1.5 inches Approach:  Lateral Ultrasound Guidance: No   Fluoroscopic Guidance: No   Arthrogram: No   Medications:  3 mL lidocaine 1 %; 40 mg methylPREDNISolone acetate 40 MG/ML Aspiration Attempted: No   Patient tolerance:  Patient tolerated the procedure well with no immediate complications      Clinical Data: No additional findings.   Subjective: Chief Complaint  Patient presents with  . Left Ankle - Pain  . Left Knee - Pain    HPI This [place] well known to Dr. Trevor Mace service comes into today due to a fall on 09/04/2016 in which she twists her leg. That she slipped in some water about 2-3 weeks ago twisted her ankle and is now having pain that radiates up to her hip. She denies any back pain. No numbness tingling down either leg. She does have right thigh pain that is sharp at times. She's having pain medial aspect of her knee. She notes  swelling of the right ankle. No mechanical symptoms of the ankle. Review of Systems Please see history of present illness otherwise negative  Objective: Vital Signs: Ht 5' (1.524 m)   Wt 264 lb (119.7 kg)   BMI 51.56 kg/m   Physical Exam  Constitutional: She is oriented to person, place, and time. She appears well-developed and well-nourished. No distress.  Neurological: She is alert and oriented to person, place, and time.  Skin: She is not diaphoretic.  Psychiatric: She has a normal mood and affect. Her behavior is normal.    Ortho Exam Left hip good range of motion without pain. Tenderness over the trochanteric region. Negative straight leg raise bilaterally. Left ankle she has tenderness medial lateral malleolus. Good dorsiflexion plantar flexion. Pes planus. Global swelling about the ankle. Slight bruising lateral aspect of the ankle. Left knee good range of motion without pain. Tenderness along medial joint line. No instability. No effusion abnormal warmth or erythema. Well-healed surgical scar. Specialty Comments:  No specialty comments available.  Imaging: Xr Ankle Complete Left  Result Date: 11/09/2016 Left ankle 3 views: No acute fracture. Talus well located within the ankle mortise no diastases. Old avulsion-type injuries off the medial malleolus.  Xr Knee 1-2 Views Left  Result Date:  11/09/2016 Left knee: No acute findings. Total knee components all well seated.    PMFS History: Patient Active Problem List   Diagnosis Date Noted  . Anxiety and depression 09/01/2016  . Bilateral lower extremity edema 09/01/2016  . Hypothyroidism 09/01/2016  . Diabetic polyneuropathy associated with type 2 diabetes mellitus ([place]) 06/03/2016  . Achilles tendon contracture, bilateral 06/03/2016  . Malignant neoplasm of upper-outer quadrant of left breast in female, estrogen receptor positive ([place]) 08/19/2015  . Discharge from left nipple 08/03/2015  . Abnormal mammogram of left  breast 08/03/2015  . Osteoarthritis of right knee 02/28/2015  . Status post total right knee replacement 02/28/2015  . Low TSH level 08/02/2014  . Splenomegaly 07/30/2014  . Thrombocytopenia ([place]) 07/30/2014  . 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 Recurrent falls 01/12/2013  . Neuropathy, diabetic ([place]) 01/12/2013  . Morbid obesity ([place]) 01/12/2013  . Degenerative arthritis of spine 01/12/2013  . Knee pain 01/12/2013  . Cough 09/05/2012  . HBP (high blood pressure) 09/05/2012  . Chest wall contusion 12/09/2011  . Abdominal wall contusion 12/09/2011   Past Medical History:  Diagnosis Date  . Anxiety   . Arthritis   . Blood dyscrasia    low platelet count- sees [name] , [name] ( Note in [place] from 07/2014) at [facility]  . Breast cancer [facility]) 2017   Left Breast  . Cancer ([place])    left breast  . Cough   . Diabetes mellitus without complication ([place])   . Diabetic neuropathy ([place])   . Gout   . Gout   . History of radiation therapy 10/22/15 - 12/08/15   left breast 50.4 Gy, boost to 10 Gy  . Hypertension   . Migraine   . 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 Neuromuscular disorder ([place])    diabetic neuropathy  . Neuropathy   . Personal history of radiation therapy 2017   Left Breast Cancer  . Sleep apnea    CPAP nightly  . Thyroid disease     Family History  Problem Relation Age of Onset  . Bone cancer Paternal Grandmother   . Heart failure Paternal Grandmother   . Cancer Maternal Grandmother   . Heart failure Maternal Grandfather   . Cancer Sister        GYN cancer    Past Surgical History:  Procedure Laterality Date  . BREAST BIOPSY Left 08/04/2015   Procedure: LEFT BREAST BIOPSY WITH NEEDLE LOCALIZATION;  Surgeon: [name], MD;  Location: [facility];  Service: General;  Laterality: Left;  . BREAST DUCTAL SYSTEM EXCISION Left 08/04/2015   Procedure: LEFT EXCISION DUCTAL SYSTEM BREAST;  Surgeon: [name], MD;   Location: [facility];  Service: General;  Laterality: Left;  . BREAST LUMPECTOMY Left 08/2015  . BREAST LUMPECTOMY WITH AXILLARY LYMPH NODE BIOPSY Left 09/12/2015   Procedure: RE-EXCISION OF LEFT BREAST LUMPECTOMY WITH LEFT AXILLARY LYMPH NODE BIOPSY;  Surgeon: [name], MD;  Location: [facility];  Service: General;  Laterality: Left;  . CHOLECYSTECTOMY    . JOINT REPLACEMENT     left knee  . TOTAL KNEE ARTHROPLASTY Right 02/28/2015   Procedure: RIGHT TOTAL KNEE ARTHROPLASTY;  Surgeon: [name], MD;  [facility];  Service: Orthopedics;  Laterality: Right;  . TUBAL LIGATION     Social History   Occupational History  . Retired     [place] History Main Topics  . Smoking status: Never Smoker  .  Smokeless tobacco: Never Used  . Alcohol use No  . Drug use: No  . Sexual activity: Not on file

## 2016-11-16 ENCOUNTER — Encounter (INDEPENDENT_AMBULATORY_CARE_PROVIDER_SITE_OTHER): Payer: Self-pay | Admitting: Orthopedic Surgery

## 2016-11-16 ENCOUNTER — Ambulatory Visit (INDEPENDENT_AMBULATORY_CARE_PROVIDER_SITE_OTHER): Payer: Medicare Other | Admitting: Orthopedic Surgery

## 2016-11-16 DIAGNOSIS — E1169 Type 2 diabetes mellitus with other specified complication: Secondary | ICD-10-CM | POA: Insufficient documentation

## 2016-11-16 DIAGNOSIS — L97519 Non-pressure chronic ulcer of other part of right foot with unspecified severity: Secondary | ICD-10-CM

## 2016-11-16 DIAGNOSIS — E11621 Type 2 diabetes mellitus with foot ulcer: Secondary | ICD-10-CM | POA: Diagnosis not present

## 2016-11-16 DIAGNOSIS — E1142 Type 2 diabetes mellitus with diabetic polyneuropathy: Secondary | ICD-10-CM | POA: Insufficient documentation

## 2016-11-16 DIAGNOSIS — Z794 Long term (current) use of insulin: Secondary | ICD-10-CM | POA: Insufficient documentation

## 2016-11-16 NOTE — Progress Notes (Signed)
Office Visit Note   Patient: [name] [name]           Date of Birth: Sep 05, 1950           MRN: [id number] Visit Date: 11/16/2016              Requested by: Orlena Sheldon, PA-C [address], [place] [address] PCP: Orlena Sheldon, PA-C  Chief Complaint  Patient presents with  . Right Great Toe - Wound Check      HPI: Patient is a 66 year old woman with diabetic insensate neuropathy who states that she was wearing her Sunday shoes and developed a blister on the right great toe. She has been using Neosporin and a Band-Aid.  Assessment & Plan: Visit Diagnoses:  1. Ulcer of right great toe due to diabetes mellitus ([place])     Plan: recommended antibiotic ointment and a Band-Aid change daily. Patient's current sneakers are too small for her foot and recommended a size larger than she is currently wearing. Recommended avoiding her dress shoes.  Follow-Up Instructions: Return in about 3 weeks (around 12/07/2016).   Ortho Exam  Patient is alert, oriented, no adenopathy, well-dressed, normal affect, normal respiratory effort. Examination patient has a abductor lurch I laterally with ambulation. Patient has a good dorsalis pedis pulse she has dorsiflexion to neutral. She has a blister over the right great toe which is approximately 2 cm in diameter. There is no exposed tendon or bone there is no cellulitis no abscess. The wound is 0.1 mm deep. Examination with her shoe wear her toe touches the end of her sneaker.  Imaging: No results found. No images are attached to the encounter.  Labs: Lab Results  Component Value Date   HGBA1C 6.6 (H) 02/21/2015   REPTSTATUS 11/02/2006 FINAL 10/31/2006   CULT INSIGNIFICANT GROWTH 10/31/2006    Orders:  No orders of the defined types were placed in this encounter.  No orders of the defined types were placed in this encounter.    Procedures: No procedures performed  Clinical Data: No additional findings.  ROS:  All other  systems negative, except as noted in the HPI. Review of Systems  Objective: Vital Signs: There were no vitals taken for this visit.  Specialty Comments:  No specialty comments available.  PMFS History: Patient Active Problem List   Diagnosis Date Noted  . Ulcer of right great toe due to diabetes mellitus ([place]) 11/16/2016  . Anxiety and depression 09/01/2016  . Bilateral lower extremity edema 09/01/2016  . Hypothyroidism 09/01/2016  . Diabetic polyneuropathy associated with type 2 diabetes mellitus ([place]) 06/03/2016  . Achilles tendon contracture, bilateral 06/03/2016  . Malignant neoplasm of upper-outer quadrant of left breast in female, estrogen receptor positive ([place]) 08/19/2015  . Discharge from left nipple 08/03/2015  . Abnormal mammogram of left breast 08/03/2015  . Osteoarthritis of right knee 02/28/2015  . Status post total right knee replacement 02/28/2015  . Low TSH level 08/02/2014  . Splenomegaly 07/30/2014  . Thrombocytopenia ([place]) 07/30/2014  . 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 Recurrent falls 01/12/2013  . Neuropathy, diabetic ([place]) 01/12/2013  . Morbid obesity ([place]) 01/12/2013  . Degenerative arthritis of spine 01/12/2013  . Knee pain 01/12/2013  . Cough 09/05/2012  . HBP (high blood pressure) 09/05/2012  . Chest wall contusion 12/09/2011  . Abdominal wall contusion 12/09/2011   Past Medical History:  Diagnosis Date  . Anxiety   . Arthritis   . Blood dyscrasia  low platelet count- sees [name] , [name] ( Note in [place] from 07/2014) at [facility]  . Breast cancer [facility]) 2017   Left Breast  . Cancer ([place])    left breast  . Cough   . Diabetes mellitus without complication ([place])   . Diabetic neuropathy ([place])   . Gout   . Gout   . History of radiation therapy 10/22/15 - 12/08/15   left breast 50.4 Gy, boost to 10 Gy  . Hypertension   . Migraine   . 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 Neuromuscular disorder ([place])     diabetic neuropathy  . Neuropathy   . Personal history of radiation therapy 2017   Left Breast Cancer  . Sleep apnea    CPAP nightly  . Thyroid disease     Family History  Problem Relation Age of Onset  . Bone cancer Paternal Grandmother   . Heart failure Paternal Grandmother   . Cancer Maternal Grandmother   . Heart failure Maternal Grandfather   . Cancer Sister        GYN cancer    Past Surgical History:  Procedure Laterality Date  . BREAST BIOPSY Left 08/04/2015   Procedure: LEFT BREAST BIOPSY WITH NEEDLE LOCALIZATION;  Surgeon: [name], MD;  Location: [facility];  Service: General;  Laterality: Left;  . BREAST DUCTAL SYSTEM EXCISION Left 08/04/2015   Procedure: LEFT EXCISION DUCTAL SYSTEM BREAST;  Surgeon: [name], MD;  Location: [facility];  Service: General;  Laterality: Left;  . BREAST LUMPECTOMY Left 08/2015  . BREAST LUMPECTOMY WITH AXILLARY LYMPH NODE BIOPSY Left 09/12/2015   Procedure: RE-EXCISION OF LEFT BREAST LUMPECTOMY WITH LEFT AXILLARY LYMPH NODE BIOPSY;  Surgeon: [name], MD;  Location: [facility];  Service: General;  Laterality: Left;  . CHOLECYSTECTOMY    . JOINT REPLACEMENT     left knee  . TOTAL KNEE ARTHROPLASTY Right 02/28/2015   Procedure: RIGHT TOTAL KNEE ARTHROPLASTY;  Surgeon: [name], MD;  [facility];  Service: Orthopedics;  Laterality: Right;  . TUBAL LIGATION     Social History   Occupational History  . Retired     [place] History Main Topics  . Smoking status: Never Smoker  . Smokeless tobacco: Never Used  . Alcohol use No  . Drug use: No  . Sexual activity: Not on file

## 2016-12-07 ENCOUNTER — Ambulatory Visit (INDEPENDENT_AMBULATORY_CARE_PROVIDER_SITE_OTHER): Payer: Medicare Other | Admitting: Orthopedic Surgery

## 2016-12-08 ENCOUNTER — Other Ambulatory Visit: Payer: Self-pay | Admitting: Gastroenterology

## 2016-12-08 DIAGNOSIS — K7469 Other cirrhosis of liver: Secondary | ICD-10-CM

## 2016-12-09 ENCOUNTER — Ambulatory Visit (INDEPENDENT_AMBULATORY_CARE_PROVIDER_SITE_OTHER): Payer: Medicare Other

## 2016-12-09 ENCOUNTER — Ambulatory Visit (INDEPENDENT_AMBULATORY_CARE_PROVIDER_SITE_OTHER): Payer: Medicare Other | Admitting: Orthopedic Surgery

## 2016-12-09 ENCOUNTER — Encounter (INDEPENDENT_AMBULATORY_CARE_PROVIDER_SITE_OTHER): Payer: Self-pay | Admitting: Orthopedic Surgery

## 2016-12-09 DIAGNOSIS — S92492A Other fracture of left great toe, initial encounter for closed fracture: Secondary | ICD-10-CM | POA: Diagnosis not present

## 2016-12-09 DIAGNOSIS — E1142 Type 2 diabetes mellitus with diabetic polyneuropathy: Secondary | ICD-10-CM | POA: Diagnosis not present

## 2016-12-09 DIAGNOSIS — S99922A Unspecified injury of left foot, initial encounter: Secondary | ICD-10-CM | POA: Diagnosis not present

## 2016-12-09 NOTE — Progress Notes (Signed)
Office Visit Note   Patient: [name] [name]           Date of Birth: 1950-07-27           MRN: [id number] Visit Date: 12/09/2016              Requested by: Orlena Sheldon, PA-C [address],  [address] PCP: Orlena Sheldon, PA-C  Chief Complaint  Patient presents with  . Left Great Toe - Injury      HPI: Patient is a 66 year old woman seen today for evaluation of left great toe pain. Dropped an ice pack on her left great toe. States had no pain at time of injury. Does have neuropathy. Later noticed bloody drainage in sock. Did remove part of her toenail. States toe nail loose on medial border. Concerned for infection, as is DM. Some swelling.   In flip flops today.   Assessment & Plan: Visit Diagnoses:  1. Other fracture of left great toe, initial encounter for closed fracture   2. Injury of left great toe, initial encounter   3. Diabetic polyneuropathy associated with type 2 diabetes mellitus ([place])     Plan: Provided Post op shoe for protected weight bearing for next 2 weeks. No soaks. No peroxide. Continue daily foot checks. [place] use neosporin and bandaid daily. Follow up for recheck of toenail in 2 weeks. Discussed return precautions.   Follow-Up Instructions: Return in about 2 weeks (around 12/23/2016).   Ortho Exam  Patient is alert, oriented, no adenopathy, well-dressed, normal affect, normal respiratory effort. On examination of left great toe, mild edema. No erythema. There is elevation of the medial nail bed. No bleeding today. No drainage. No sign of infection. Mild tenderness along proximal phalanx. Full flexion GT.   Imaging: Xr Toe Great Left  Result Date: 12/09/2016 Radiograph of left great toe show avulsion fracture base of proximal phalanx.  No images are attached to the encounter.  Labs: Lab Results  Component Value Date   HGBA1C 6.6 (H) 02/21/2015   REPTSTATUS 11/02/2006 FINAL 10/31/2006   CULT INSIGNIFICANT GROWTH 10/31/2006     Orders:  Orders Placed This Encounter  Procedures  . XR Toe Great Left   No orders of the defined types were placed in this encounter.    Procedures: No procedures performed  Clinical Data: No additional findings.  ROS:  All other systems negative, except as noted in the HPI. Review of Systems  Constitutional: Negative for chills and fever.  Musculoskeletal: Positive for arthralgias.  Skin: Positive for wound. Negative for color change.    Objective: Vital Signs: There were no vitals taken for this visit.  Specialty Comments:  No specialty comments available.  PMFS History: Patient Active Problem List   Diagnosis Date Noted  . Ulcer of right great toe due to diabetes mellitus ([place]) 11/16/2016  . Anxiety and depression 09/01/2016  . Bilateral lower extremity edema 09/01/2016  . Hypothyroidism 09/01/2016  . Diabetic polyneuropathy associated with type 2 diabetes mellitus ([place]) 06/03/2016  . Achilles tendon contracture, bilateral 06/03/2016  . Malignant neoplasm of upper-outer quadrant of left breast in female, estrogen receptor positive ([place]) 08/19/2015  . Discharge from left nipple 08/03/2015  . Abnormal mammogram of left breast 08/03/2015  . Osteoarthritis of right knee 02/28/2015  . Status post total right knee replacement 02/28/2015  . Low TSH level 08/02/2014  . Splenomegaly 07/30/2014  . Thrombocytopenia ([place]) 07/30/2014  . 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  Recurrent falls 01/12/2013  . Neuropathy, diabetic ([place]) 01/12/2013  . Morbid obesity ([place]) 01/12/2013  . Degenerative arthritis of spine 01/12/2013  . Knee pain 01/12/2013  . Cough 09/05/2012  . HBP (high blood pressure) 09/05/2012  . Chest wall contusion 12/09/2011  . Abdominal wall contusion 12/09/2011   Past Medical History:  Diagnosis Date  . Anxiety   . Arthritis   . Blood dyscrasia    low platelet count- sees [name] , [name] ( Note in [place] from 07/2014) at  [facility]  . Breast cancer [facility]) 2017   Left Breast  . Cancer ([place])    left breast  . Cough   . Diabetes mellitus without complication ([place])   . Diabetic neuropathy ([place])   . Gout   . Gout   . History of radiation therapy 10/22/15 - 12/08/15   left breast 50.4 Gy, boost to 10 Gy  . Hypertension   . Migraine   . 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 Neuromuscular disorder ([place])    diabetic neuropathy  . Neuropathy   . Personal history of radiation therapy 2017   Left Breast Cancer  . Sleep apnea    CPAP nightly  . Thyroid disease     Family History  Problem Relation Age of Onset  . Bone cancer Paternal Grandmother   . Heart failure Paternal Grandmother   . Cancer Maternal Grandmother   . Heart failure Maternal Grandfather   . Cancer Sister        GYN cancer    Past Surgical History:  Procedure Laterality Date  . BREAST LUMPECTOMY Left 08/2015  . CHOLECYSTECTOMY    . JOINT REPLACEMENT     left knee  . TUBAL LIGATION     Social History   Occupational History  . Occupation: Retired    Comment: [employer]  Tobacco Use  . Smoking status: Never Smoker  . Smokeless tobacco: Never Used  Substance and Sexual Activity  . Alcohol use: No  . Drug use: No  . Sexual activity: Not on file

## 2016-12-14 ENCOUNTER — Ambulatory Visit (INDEPENDENT_AMBULATORY_CARE_PROVIDER_SITE_OTHER): Payer: Medicare Other | Admitting: Orthopedic Surgery

## 2016-12-14 ENCOUNTER — Encounter (INDEPENDENT_AMBULATORY_CARE_PROVIDER_SITE_OTHER): Payer: Self-pay | Admitting: Orthopedic Surgery

## 2016-12-14 DIAGNOSIS — L03031 Cellulitis of right toe: Secondary | ICD-10-CM | POA: Diagnosis not present

## 2016-12-14 DIAGNOSIS — L03032 Cellulitis of left toe: Secondary | ICD-10-CM

## 2016-12-14 NOTE — Progress Notes (Signed)
Office Visit Note   Patient: [name] [name]           Date of Birth: 1950/03/06           MRN: [id number] Visit Date: 12/14/2016              Requested by: Orlena Sheldon, PA-C [address], [place] [address] PCP: Orlena Sheldon, PA-C  Chief Complaint  Patient presents with  . Left Foot - Follow-up      HPI: Patient presents complaining of painful redness cellulitis and looseness of the left great toenail.  Assessment & Plan: Visit Diagnoses:  1. Paronychia of toe of right foot     Plan: L was removed without complication she will start antibiotic ointment dressing changes tomorrow continue with a postoperative shoe follow-up as scheduled  Follow-Up Instructions: Return in about 1 week (around 12/21/2016).   Ortho Exam  Patient is alert, oriented, no adenopathy, well-dressed, normal affect, normal respiratory effort. Examination patient has a good pulse she has cellulitis with a paronychial infection of the left great toenail the nail was loose.  There is no ascending cellulitis.  Consent and sterile prepping patient underwent a digital block with 10 cc of 1% lidocaine plain the nail was sterilely prepped using Betadine the nail was removed a Xeroform and dry dressing was applied with Coban and patient tolerated procedure without complications.  Imaging: No results found. No images are attached to the encounter.  Labs: Lab Results  Component Value Date   HGBA1C 6.6 (H) 02/21/2015   REPTSTATUS 11/02/2006 FINAL 10/31/2006   CULT INSIGNIFICANT GROWTH 10/31/2006    Orders:  No orders of the defined types were placed in this encounter.  No orders of the defined types were placed in this encounter.    Procedures: No procedures performed  Clinical Data: No additional findings.  ROS:  All other systems negative, except as noted in the HPI. Review of Systems  Objective: Vital Signs: There were no vitals taken for this visit.  Specialty Comments:    No specialty comments available.  PMFS History: Patient Active Problem List   Diagnosis Date Noted  . Ulcer of right great toe due to diabetes mellitus ([place]) 11/16/2016  . Anxiety and depression 09/01/2016  . Bilateral lower extremity edema 09/01/2016  . Hypothyroidism 09/01/2016  . Diabetic polyneuropathy associated with type 2 diabetes mellitus ([place]) 06/03/2016  . Achilles tendon contracture, bilateral 06/03/2016  . Malignant neoplasm of upper-outer quadrant of left breast in female, estrogen receptor positive ([place]) 08/19/2015  . Discharge from left nipple 08/03/2015  . Abnormal mammogram of left breast 08/03/2015  . Osteoarthritis of right knee 02/28/2015  . Status post total right knee replacement 02/28/2015  . Low TSH level 08/02/2014  . Splenomegaly 07/30/2014  . Thrombocytopenia ([place]) 07/30/2014  . 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 Recurrent falls 01/12/2013  . Neuropathy, diabetic ([place]) 01/12/2013  . Morbid obesity ([place]) 01/12/2013  . Degenerative arthritis of spine 01/12/2013  . Knee pain 01/12/2013  . Cough 09/05/2012  . HBP (high blood pressure) 09/05/2012  . Chest wall contusion 12/09/2011  . Abdominal wall contusion 12/09/2011   Past Medical History:  Diagnosis Date  . Anxiety   . Arthritis   . Blood dyscrasia    low platelet count- sees [name] , [name] ( Note in [place] from 07/2014) at [facility]  . Breast cancer [facility]) 2017   Left Breast  . Cancer ([place])  left breast  . Cough   . Diabetes mellitus without complication ([place])   . Diabetic neuropathy ([place])   . Gout   . Gout   . History of radiation therapy 10/22/15 - 12/08/15   left breast 50.4 Gy, boost to 10 Gy  . Hypertension   . Migraine   . 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 Neuromuscular disorder ([place])    diabetic neuropathy  . Neuropathy   . Personal history of radiation therapy 2017   Left Breast Cancer  . Sleep apnea    CPAP nightly  . Thyroid  disease     Family History  Problem Relation Age of Onset  . Bone cancer Paternal Grandmother   . Heart failure Paternal Grandmother   . Cancer Maternal Grandmother   . Heart failure Maternal Grandfather   . Cancer Sister        GYN cancer    Past Surgical History:  Procedure Laterality Date  . BREAST LUMPECTOMY Left 08/2015  . CHOLECYSTECTOMY    . JOINT REPLACEMENT     left knee  . TUBAL LIGATION     Social History   Occupational History  . Occupation: Retired    Comment: [employer]  Tobacco Use  . Smoking status: Never Smoker  . Smokeless tobacco: Never Used  Substance and Sexual Activity  . Alcohol use: No  . Drug use: No  . Sexual activity: Not on file

## 2016-12-15 ENCOUNTER — Other Ambulatory Visit: Payer: Self-pay | Admitting: Surgery

## 2016-12-15 ENCOUNTER — Other Ambulatory Visit: Payer: Medicare Other

## 2016-12-15 ENCOUNTER — Ambulatory Visit
Admission: RE | Admit: 2016-12-15 | Discharge: 2016-12-15 | Disposition: A | Discharge status: Home / Self Care | Payer: Medicare Other | Source: Ambulatory Visit | Attending: Surgery | Admitting: Surgery

## 2016-12-15 DIAGNOSIS — N6489 Other specified disorders of breast: Secondary | ICD-10-CM | POA: Diagnosis not present

## 2016-12-15 DIAGNOSIS — N6320 Unspecified lump in the left breast, unspecified quadrant: Secondary | ICD-10-CM

## 2016-12-15 DIAGNOSIS — N6310 Unspecified lump in the right breast, unspecified quadrant: Secondary | ICD-10-CM

## 2016-12-27 ENCOUNTER — Ambulatory Visit (INDEPENDENT_AMBULATORY_CARE_PROVIDER_SITE_OTHER): Payer: Medicare Other | Admitting: Orthopedic Surgery

## 2016-12-27 ENCOUNTER — Encounter (INDEPENDENT_AMBULATORY_CARE_PROVIDER_SITE_OTHER): Payer: Self-pay | Admitting: Orthopedic Surgery

## 2016-12-27 VITALS — Ht 60.0 in | Wt 264.0 lb

## 2016-12-27 DIAGNOSIS — L03032 Cellulitis of left toe: Secondary | ICD-10-CM

## 2016-12-27 DIAGNOSIS — S92492A Other fracture of left great toe, initial encounter for closed fracture: Secondary | ICD-10-CM

## 2016-12-27 DIAGNOSIS — E1142 Type 2 diabetes mellitus with diabetic polyneuropathy: Secondary | ICD-10-CM

## 2016-12-27 NOTE — Progress Notes (Signed)
Office Visit Note   Patient: [name] [name]           Date of Birth: 11/04/1950           MRN: [id number] Visit Date: 12/27/2016              Requested by: Orlena Sheldon, PA-C [address], [place] [address] PCP: Orlena Sheldon, PA-C  Chief Complaint  Patient presents with  . Left Foot - Follow-up    Great toe nail excision      HPI: Patient presents in follow-up for fracture of her toe as well as avulsion of the left great toenails status post removal of the great toenail.  Patient denies any pain denies any redness or swelling.  Assessment & Plan: Visit Diagnoses:  1. Paronychia of toe of left foot   2. Diabetic polyneuropathy associated with type 2 diabetes mellitus ([place])   3. Other fracture of left great toe, initial encounter for closed fracture     Plan: We will have her advance to regular shoewear she was given a pad to put in the first webspace to keep the second toe rubbing on the great toe.  Follow-Up Instructions: Return if symptoms worsen or fail to improve.   Ortho Exam  Patient is alert, oriented, no adenopathy, well-dressed, normal affect, normal respiratory effort. Examination patient has an antalgic gait no redness no cellulitis of the foot.  There is no evidence of any persistence of the paronychial infection.  The nail bed is clean and dry there is no tenderness to palpation around the great toe.  Imaging: No results found. No images are attached to the encounter.  Labs: Lab Results  Component Value Date   HGBA1C 6.6 (H) 02/21/2015   REPTSTATUS 11/02/2006 FINAL 10/31/2006   CULT INSIGNIFICANT GROWTH 10/31/2006    @LABSALLVALUES (HGBA1)@  @BMI1 @  Orders:  No orders of the defined types were placed in this encounter.  No orders of the defined types were placed in this encounter.    Procedures: No procedures performed  Clinical Data: No additional findings.  ROS:  All other systems negative, except as noted in the  HPI. Review of Systems  Objective: Vital Signs: Ht 5' (1.524 m)   Wt 264 lb (119.7 kg)   BMI 51.56 kg/m   Specialty Comments:  No specialty comments available.  PMFS History: Patient Active Problem List   Diagnosis Date Noted  . Ulcer of right great toe due to diabetes mellitus ([place]) 11/16/2016  . Anxiety and depression 09/01/2016  . Bilateral lower extremity edema 09/01/2016  . Hypothyroidism 09/01/2016  . Diabetic polyneuropathy associated with type 2 diabetes mellitus ([place]) 06/03/2016  . Achilles tendon contracture, bilateral 06/03/2016  . Malignant neoplasm of upper-outer quadrant of left breast in female, estrogen receptor positive ([place]) 08/19/2015  . Discharge from left nipple 08/03/2015  . Abnormal mammogram of left breast 08/03/2015  . Osteoarthritis of right knee 02/28/2015  . Status post total right knee replacement 02/28/2015  . Low TSH level 08/02/2014  . Splenomegaly 07/30/2014  . Thrombocytopenia ([place]) 07/30/2014  . 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 Recurrent falls 01/12/2013  . Neuropathy, diabetic ([place]) 01/12/2013  . Morbid obesity ([place]) 01/12/2013  . Degenerative arthritis of spine 01/12/2013  . Knee pain 01/12/2013  . Cough 09/05/2012  . HBP (high blood pressure) 09/05/2012  . Chest wall contusion 12/09/2011  . Abdominal wall contusion 12/09/2011   Past Medical History:  Diagnosis Date  . Anxiety   .  Arthritis   . Blood dyscrasia    low platelet count- sees [name] , [name] ( Note in [place] from 07/2014) at [facility]  . Breast cancer [facility]) 2017   Left Breast  . Cancer ([place])    left breast  . Cough   . Diabetes mellitus without complication ([place])   . Diabetic neuropathy ([place])   . Gout   . Gout   . History of radiation therapy 10/22/15 - 12/08/15   left breast 50.4 Gy, boost to 10 Gy  . Hypertension   . Migraine   . 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 Neuromuscular disorder ([place])    diabetic neuropathy   . Neuropathy   . Personal history of radiation therapy 2017   Left Breast Cancer  . Sleep apnea    CPAP nightly  . Thyroid disease     Family History  Problem Relation Age of Onset  . Bone cancer Paternal Grandmother   . Heart failure Paternal Grandmother   . Cancer Maternal Grandmother   . Heart failure Maternal Grandfather   . Cancer Sister        GYN cancer    Past Surgical History:  Procedure Laterality Date  . BREAST BIOPSY Left 08/04/2015   Procedure: LEFT BREAST BIOPSY WITH NEEDLE LOCALIZATION;  Surgeon: [name], MD;  Location: [facility];  Service: General;  Laterality: Left;  . BREAST DUCTAL SYSTEM EXCISION Left 08/04/2015   Procedure: LEFT EXCISION DUCTAL SYSTEM BREAST;  Surgeon: [name], MD;  Location: [facility];  Service: General;  Laterality: Left;  . BREAST LUMPECTOMY Left 08/2015  . BREAST LUMPECTOMY WITH AXILLARY LYMPH NODE BIOPSY Left 09/12/2015   Procedure: RE-EXCISION OF LEFT BREAST LUMPECTOMY WITH LEFT AXILLARY LYMPH NODE BIOPSY;  Surgeon: [name], MD;  Location: [facility];  Service: General;  Laterality: Left;  . CHOLECYSTECTOMY    . JOINT REPLACEMENT     left knee  . TOTAL KNEE ARTHROPLASTY Right 02/28/2015   Procedure: RIGHT TOTAL KNEE ARTHROPLASTY;  Surgeon: [name], MD;  Location: [facility];  Service: Orthopedics;  Laterality: Right;  . TUBAL LIGATION     Social History   Occupational History  . Occupation: Retired    Comment: [employer]  Tobacco Use  . Smoking status: Never Smoker  . Smokeless tobacco: Never Used  Substance and Sexual Activity  . Alcohol use: No  . Drug use: No  . Sexual activity: Not on file

## 2016-12-28 ENCOUNTER — Ambulatory Visit
Admission: RE | Admit: 2016-12-28 | Discharge: 2016-12-28 | Disposition: A | Discharge status: Home / Self Care | Payer: Medicare Other | Source: Ambulatory Visit | Attending: Gastroenterology | Admitting: Gastroenterology

## 2016-12-28 DIAGNOSIS — K7460 Unspecified cirrhosis of liver: Secondary | ICD-10-CM | POA: Diagnosis not present

## 2016-12-28 DIAGNOSIS — K7469 Other cirrhosis of liver: Secondary | ICD-10-CM

## 2017-01-03 DIAGNOSIS — K7460 Unspecified cirrhosis of liver: Secondary | ICD-10-CM | POA: Diagnosis not present

## 2017-01-12 ENCOUNTER — Ambulatory Visit: Payer: Medicare Other | Admitting: Oncology

## 2017-01-12 ENCOUNTER — Encounter: Payer: Self-pay | Admitting: Physician Assistant

## 2017-01-18 ENCOUNTER — Ambulatory Visit: Payer: Medicare Other | Admitting: Oncology

## 2017-01-18 ENCOUNTER — Encounter: Payer: Self-pay | Admitting: Oncology

## 2017-01-18 NOTE — Progress Notes (Signed)
No show

## 2017-01-24 ENCOUNTER — Other Ambulatory Visit: Payer: Self-pay | Admitting: *Deleted

## 2017-01-24 MED ORDER — ANASTROZOLE 1 MG PO TABS: 1.0000 mg | ORAL_TABLET | Freq: Every day | ORAL | 4 refills | Status: DC

## 2017-03-04 ENCOUNTER — Other Ambulatory Visit: Payer: Self-pay | Admitting: Physician Assistant

## 2017-03-07 ENCOUNTER — Ambulatory Visit (INDEPENDENT_AMBULATORY_CARE_PROVIDER_SITE_OTHER): Payer: Medicare Other | Admitting: Physician Assistant

## 2017-03-07 ENCOUNTER — Other Ambulatory Visit: Payer: Self-pay

## 2017-03-07 ENCOUNTER — Encounter: Payer: Self-pay | Admitting: Physician Assistant

## 2017-03-07 VITALS — BP 132/80 | HR 74 | Temp 98.1°F | Resp 14 | Wt 267.4 lb

## 2017-03-07 DIAGNOSIS — D696 Thrombocytopenia, unspecified: Secondary | ICD-10-CM | POA: Diagnosis not present

## 2017-03-07 DIAGNOSIS — R161 Splenomegaly, not elsewhere classified: Secondary | ICD-10-CM

## 2017-03-07 DIAGNOSIS — E0841 Diabetes mellitus due to underlying condition with diabetic mononeuropathy: Secondary | ICD-10-CM | POA: Diagnosis not present

## 2017-03-07 DIAGNOSIS — M1711 Unilateral primary osteoarthritis, right knee: Secondary | ICD-10-CM

## 2017-03-07 DIAGNOSIS — E1142 Type 2 diabetes mellitus with diabetic polyneuropathy: Secondary | ICD-10-CM

## 2017-03-07 DIAGNOSIS — R2689 Other abnormalities of gait and mobility: Secondary | ICD-10-CM | POA: Diagnosis not present

## 2017-03-07 DIAGNOSIS — R600 Localized edema: Secondary | ICD-10-CM | POA: Diagnosis not present

## 2017-03-07 DIAGNOSIS — I10 Essential (primary) hypertension: Secondary | ICD-10-CM | POA: Diagnosis not present

## 2017-03-07 DIAGNOSIS — E039 Hypothyroidism, unspecified: Secondary | ICD-10-CM

## 2017-03-07 DIAGNOSIS — F419 Anxiety disorder, unspecified: Secondary | ICD-10-CM | POA: Diagnosis not present

## 2017-03-07 DIAGNOSIS — F329 Major depressive disorder, single episode, unspecified: Secondary | ICD-10-CM

## 2017-03-07 LAB — BASIC METABOLIC PANEL WITH GFR
BUN: 12 mg/dL (ref 7–25)
CO2: 25 mmol/L (ref 20–32)
CREATININE: 0.85 mg/dL (ref 0.50–0.99)
Calcium: 9.8 mg/dL (ref 8.6–10.4)
Chloride: 101 mmol/L (ref 98–110)
GFR, EST AFRICAN AMERICAN: 83 mL/min/{1.73_m2} (ref >=60)
GFR, Est Non African American: 71 mL/min/{1.73_m2} (ref >=60)
Glucose, Bld: 225 mg/dL — ABNORMAL HIGH (ref 65–99)
Potassium: 4 mmol/L (ref 3.5–5.3)
Sodium: 139 mmol/L (ref 135–146)

## 2017-03-07 LAB — TSH: TSH: 1.97 m[IU]/L (ref 0.40–4.50)

## 2017-03-07 LAB — EXTRA LAV TOP TUBE

## 2017-03-07 MED ORDER — GABAPENTIN 600 MG PO TABS: 600.0000 mg | ORAL_TABLET | Freq: Every day | ORAL | 0 refills | Status: DC

## 2017-03-07 NOTE — Progress Notes (Signed)
Patient ID: [name] [name] MRN: [id number], DOB: November 28, 1950, 67 y.o. Date of Encounter: @DATE @  Chief Complaint:  No chief complaint on file.   HPI: 67 y.o. year old female      09/01/2016: presents as a New Pt to [facility].   Her husband is also on my schedule for later today as a new patient to establish care. Pt reports that they received a letter that their primary PCP had left. Even tried a new doctor.  Did not like him so they are now establishing care here.  She reports that also they live very close by and they have family members who come here. Also notes that they have land near [place]. Samella Parr in-laws.  Reviewed that she sees the following specialists to manage the following medical conditions:  Dr. Ninfa Linden is in the same group as Dr. Sharol Given-- [facility]. She sees him for her knees etc. Dr. Sharol Given at [facility]--- for her feet Dr. Mariah Milling----- breast cancer Dr. Blanchard Mane radiation  Dr. Raelyn Mora---- Endocrinology--But they ONLY MANAGE HER DIABETES----  says that they have her in a research study -----------------They do NOT do her THYROID Dr. Dia Sitter her liver and spleen"    I then discussed her remaining medical problems which I will manage:  She states that she has been on the Celexa for about 10 years for anxiety and depression.  Says that in the past she was in therapy with Lajuana Ripple. Also says that she was on a lot of medicines for this in the past. Feels that the Celexa is working well and is completely controlling her symptoms. She does note that she has had the following 3 stressors just over the past one year: -23 year old Aunt who has cancer is now living with her -Her husband has been diagnosed with rheumatoid arthritis -She was diagnosed with breast cancer March 2017 However despite all of these stressors says that she does feel that her anxiety and depression are controlled and stable.  She reports that she takes  Lasix 20 mg every single day for the past 1-2 years. States that she takes this for lower extremity edema. Has no known history of heart failure.  She reports that she takes the gabapentin for neuropathy. Says that "it is bad in her feet and now is also having in her fingers". Does see Dr. Sharol Given -- as well-- for her feet.  She is on thyroid medication child will monitor. This is stable in regards to symptoms.  She is on losartan for blood pressure which I will monitor. She is having no adverse effects with this medicine is causing the lightheadedness.  She has no specific concerns to address today.   03/07/2017: She reports that everything has remained stable. Reports that she has continued to follow-up with all of her prior specialists and is taking all of those medications as directed. She has no specific concerns to address today. She continues to see Dr. Ninfa Linden and Dr. Sharol Given. She continues to see Dr. Jana Hakim for follow-up of breast cancer. She continues to see endocrinology for her diabetes. Continues to see Dr. Cristina Gong for her liver and spleen. Notes that her platelets were low the last time they were checked and she will continue to have these monitored by specialist. She reports that the Celexa continues to work well and is controlling her anxiety and depression symptoms and is causing no adverse effects. She reports that the current dose of Lasix is working well at controlling her edema. She  continues to take her thyroid medication daily. She continues to take blood pressure medication daily.  Having no lightheadedness or other adverse effects.     Past Medical History:  Diagnosis Date  . Anxiety   . Arthritis   . Blood dyscrasia    low platelet count- sees [name] , [name] ( Note in [place] from 07/2014) at [facility]  . Breast cancer [facility]) 2017   Left Breast  . Cancer ([place])    left breast  . Cough   . Diabetes mellitus without complication ([place])     . Diabetic neuropathy ([place])   . Gout   . Gout   . History of radiation therapy 10/22/15 - 12/08/15   left breast 50.4 Gy, boost to 10 Gy  . Hypertension   . Migraine   . 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 Neuromuscular disorder ([place])    diabetic neuropathy  . Neuropathy   . Personal history of radiation therapy 2017   Left Breast Cancer  . Sleep apnea    CPAP nightly  . Thyroid disease      Home Meds: Outpatient Medications Prior to Visit  Medication Sig Dispense Refill  . acetaminophen (TYLENOL) 325 MG tablet Take 650 mg by mouth every 6 (six) hours as needed.    [name] anastrozole (ARIMIDEX) 1 MG tablet Take 1 tablet (1 mg total) by mouth daily. 90 tablet 4  . citalopram (CELEXA) 20 MG tablet TAKE 1 TABLET BY MOUTH ONCE A DAY FOR ANXIETY  1  . furosemide (LASIX) 20 MG tablet TAKE 1 TABLET BY MOUTH EVERY DAY AS NEEDED 90 tablet 3  . gabapentin (NEURONTIN) 600 MG tablet Take 600 mg by mouth daily.  0  . ibuprofen (ADVIL,MOTRIN) 200 MG tablet Take 200 mg by mouth every 6 (six) hours as needed.    [name] levothyroxine (SYNTHROID, LEVOTHROID) 137 MCG tablet Take 137 mcg by mouth daily.  2  . Liraglutide (VICTOZA) 18 MG/3ML SOPN Inject 1.8 mg into the skin daily.    [name] loratadine (CLARITIN) 10 MG tablet Take 10 mg by mouth daily.    [name] losartan (COZAAR) 50 MG tablet Take 1 tablet by mouth every morning.     . metFORMIN (GLUCOPHAGE) 500 MG tablet Take by mouth 2 (two) times daily with a meal.    . methocarbamol (ROBAXIN) 500 MG tablet Take 500 mg by mouth 4 (four) times daily.    [name] nystatin (MYCOSTATIN/NYSTOP) powder Apply topically as needed.      No facility-administered medications prior to visit.     Allergies:  Allergies  Allergen Reactions  . Influenza A (H1n1) [place] Pf Anaphylaxis  . Codeine Swelling    Social History   Socioeconomic History  . Marital status: Married    Spouse name: Not on file  . Number of children: 3  . Years of education: Not on file  . Highest  education level: Not on file  Social Needs  . Financial resource strain: Not on file  . Food insecurity - worry: Not on file  . Food insecurity - inability: Not on file  . Transportation needs - medical: Not on file  . Transportation needs - non-medical: Not on file  Occupational History  . Occupation: Retired    Comment: [employer]  Tobacco Use  . Smoking status: Never Smoker  . Smokeless tobacco: Never Used  Substance and Sexual Activity  . Alcohol use: No  . Drug use: No  . Sexual activity: Not on  file  Other Topics Concern  . Not on file  Social History Narrative  . Not on file    Family History  Problem Relation Age of Onset  . Bone cancer Paternal Grandmother   . Heart failure Paternal Grandmother   . Cancer Maternal Grandmother   . Heart failure Maternal Grandfather   . Cancer Sister        GYN cancer     Review of Systems:  See HPI for pertinent ROS. All other ROS negative.    Physical Exam: Blood pressure 132/80, pulse 74, temperature 98.1 F (36.7 C), temperature source Oral, resp. rate 14, weight 121.3 kg (267 lb 6.4 oz), SpO2 98 %., There is no height or weight on file to calculate BMI. General: Obese WF.Appears in no acute distress. Neck: Supple. No thyromegaly. No lymphadenopathy. No carotid bruits. Lungs: Clear bilaterally to auscultation without wheezes, rales, or rhonchi. Breathing is unlabored. Heart: RRR with S1 S2. No murmurs, rubs, or gallops. Abdomen: Soft, non-tender, non-distended with normoactive bowel sounds. No hepatomegaly. No rebound/guarding. No obvious abdominal masses. Musculoskeletal:  Strength and tone normal for age. Extremities/Skin: Warm and dry.  No LE edema. Neuro: Alert and oriented X 3. Moves all extremities spontaneously. Gait is normal. CNII-XII grossly in tact. Psych:  Responds to questions appropriately with a normal affect.     ASSESSMENT AND PLAN:  67 y.o. year old female with   1. Anxiety and  depression 03/07/2017: Stable/controlled. Continue current dose of Celexa.  2. Essential hypertension 03/07/2017:At goal. Continue current dose of medications. Check lab to monitor. - BASIC METABOLIC PANEL WITH GFR  3. Diabetic polyneuropathy associated with type 2 diabetes mellitus ([place]) 03/07/2017:She'll continue her current dose of gabapentin and will continue follow-up with Dr. Sharol Given  4. Bilateral lower extremity edema 03/07/2017:This is controlled. Continue current dose of Lasix. Check lab to monitor. - BASIC METABOLIC PANEL WITH GFR  5. Hypothyroidism, unspecified type 03/07/2017: She is on levothyroxine. Check TSH to monitor. - TSH  6. Preventative care 03/07/2017: Both she and her husband are having routine visits today.  They report that it is been 1 year since her last CPE and they are interested in scheduling CPE.  However wife states that there is schedule is very full right now so they will schedule for CPE as their schedule allows. Well I discussed that for her most preventive care is already up-to-date because of all of her other medical issues and the specialist that she is already seeing.  Cancer screenings: Managed by oncology Immunizations: She reports that she knows that she received a pneumonia vaccine and a tetanus vaccine when she had her knee replacement about one year ago. She will try to get a report/record of this so that we can enter this into computer. She does not want to get a shingles vaccine. She says that she has had allergic reaction to flu shot in the past and will not be getting any further flu shots.   Discussed with patient that at this point everything seems to be well controlled and managed. Therefore feel that she can wait 6 months for follow-up routine visit. However she can return for follow-up sooner if feels that this is needed or for any acute illnesses in the interim. 03/07/2017: Both she and her husband are having routine visits today.  They report that  it is been 1 year since her last CPE and they are interested in scheduling CPE.  However wife states that there is schedule is very  full right now so they will schedule for CPE as their schedule allows. Well I discussed that for her most preventive care is already up-to-date because of all of her other medical issues and the specialist that she is already seeing.   Signed, [address] [place], [place], [facility] 03/07/2017 7:57 AM

## 2017-03-07 NOTE — Telephone Encounter (Signed)
Refill appropriate 

## 2017-03-08 ENCOUNTER — Telehealth: Payer: Self-pay

## 2017-03-08 NOTE — Telephone Encounter (Signed)
Patient was in office on 03/07/17 and states her rx for gabapentin is incorrect she states it should be gabapentin 600 mg 1 tablet in the morning, 1 tablet in the afternoon, and 2 tablets at bedtime.  Is it okay to change the rx pls advise.

## 2017-03-09 MED ORDER — GABAPENTIN 600 MG PO TABS: ORAL_TABLET | ORAL | 0 refills | Status: DC

## 2017-03-09 NOTE — Telephone Encounter (Signed)
Rx has been changed patient is aware

## 2017-03-09 NOTE — Telephone Encounter (Signed)
Ok. Approved.

## 2017-03-15 ENCOUNTER — Other Ambulatory Visit: Payer: Self-pay | Admitting: Surgery

## 2017-03-15 ENCOUNTER — Ambulatory Visit
Admission: RE | Admit: 2017-03-15 | Discharge: 2017-03-15 | Disposition: A | Discharge status: Home / Self Care | Payer: Medicare Other | Source: Ambulatory Visit | Attending: Surgery | Admitting: Surgery

## 2017-03-15 DIAGNOSIS — N6320 Unspecified lump in the left breast, unspecified quadrant: Secondary | ICD-10-CM

## 2017-03-15 DIAGNOSIS — N6322 Unspecified lump in the left breast, upper inner quadrant: Secondary | ICD-10-CM | POA: Diagnosis not present

## 2017-03-15 DIAGNOSIS — R928 Other abnormal and inconclusive findings on diagnostic imaging of breast: Secondary | ICD-10-CM | POA: Diagnosis not present

## 2017-03-18 ENCOUNTER — Other Ambulatory Visit: Payer: Self-pay | Admitting: Surgery

## 2017-03-18 ENCOUNTER — Ambulatory Visit
Admission: RE | Admit: 2017-03-18 | Discharge: 2017-03-18 | Disposition: A | Discharge status: Home / Self Care | Payer: Medicare Other | Source: Ambulatory Visit | Attending: Surgery | Admitting: Surgery

## 2017-03-18 DIAGNOSIS — N641 Fat necrosis of breast: Secondary | ICD-10-CM | POA: Diagnosis not present

## 2017-03-18 DIAGNOSIS — N6322 Unspecified lump in the left breast, upper inner quadrant: Secondary | ICD-10-CM | POA: Diagnosis not present

## 2017-03-18 DIAGNOSIS — N6320 Unspecified lump in the left breast, unspecified quadrant: Secondary | ICD-10-CM

## 2017-04-08 ENCOUNTER — Other Ambulatory Visit: Payer: Self-pay | Admitting: Physician Assistant

## 2017-04-11 ENCOUNTER — Other Ambulatory Visit: Payer: Self-pay

## 2017-04-11 MED ORDER — CITALOPRAM HYDROBROMIDE 20 MG PO TABS: ORAL_TABLET | ORAL | 1 refills | Status: DC

## 2017-04-20 ENCOUNTER — Other Ambulatory Visit: Payer: Self-pay | Admitting: Physician Assistant

## 2017-05-19 ENCOUNTER — Other Ambulatory Visit: Payer: Self-pay | Admitting: Physician Assistant

## 2017-05-26 ENCOUNTER — Other Ambulatory Visit: Payer: Self-pay | Admitting: Physician Assistant

## 2017-06-02 ENCOUNTER — Other Ambulatory Visit: Payer: Self-pay | Admitting: Physician Assistant

## 2017-06-28 ENCOUNTER — Other Ambulatory Visit: Payer: Self-pay | Admitting: Gastroenterology

## 2017-06-28 DIAGNOSIS — K7460 Unspecified cirrhosis of liver: Secondary | ICD-10-CM

## 2017-06-29 ENCOUNTER — Other Ambulatory Visit: Payer: Self-pay | Admitting: Physician Assistant

## 2017-07-07 ENCOUNTER — Ambulatory Visit
Admission: RE | Admit: 2017-07-07 | Discharge: 2017-07-07 | Disposition: A | Discharge status: Home / Self Care | Payer: Medicare Other | Source: Ambulatory Visit | Attending: Gastroenterology | Admitting: Gastroenterology

## 2017-07-07 DIAGNOSIS — K7460 Unspecified cirrhosis of liver: Secondary | ICD-10-CM

## 2017-07-21 ENCOUNTER — Other Ambulatory Visit: Payer: Self-pay | Admitting: Physician Assistant

## 2017-08-09 ENCOUNTER — Telehealth: Payer: Self-pay | Admitting: Oncology

## 2017-08-09 NOTE — Telephone Encounter (Signed)
Returned patients call to r/s upcoming sept appts

## 2017-08-13 ENCOUNTER — Other Ambulatory Visit: Payer: Self-pay | Admitting: Physician Assistant

## 2017-08-23 ENCOUNTER — Encounter: Payer: Self-pay | Admitting: Physician Assistant

## 2017-08-26 DIAGNOSIS — H524 Presbyopia: Secondary | ICD-10-CM | POA: Diagnosis not present

## 2017-08-26 DIAGNOSIS — H43813 Vitreous degeneration, bilateral: Secondary | ICD-10-CM | POA: Diagnosis not present

## 2017-08-26 DIAGNOSIS — H2513 Age-related nuclear cataract, bilateral: Secondary | ICD-10-CM | POA: Diagnosis not present

## 2017-08-26 DIAGNOSIS — H35373 Puckering of macula, bilateral: Secondary | ICD-10-CM | POA: Diagnosis not present

## 2017-09-05 ENCOUNTER — Ambulatory Visit: Payer: Medicare Other | Admitting: Physician Assistant

## 2017-09-10 ENCOUNTER — Other Ambulatory Visit: Payer: Self-pay | Admitting: Physician Assistant

## 2017-09-15 DIAGNOSIS — H25011 Cortical age-related cataract, right eye: Secondary | ICD-10-CM | POA: Diagnosis not present

## 2017-09-15 DIAGNOSIS — H25811 Combined forms of age-related cataract, right eye: Secondary | ICD-10-CM | POA: Diagnosis not present

## 2017-09-15 DIAGNOSIS — H2511 Age-related nuclear cataract, right eye: Secondary | ICD-10-CM | POA: Diagnosis not present

## 2017-09-28 ENCOUNTER — Other Ambulatory Visit: Payer: Self-pay

## 2017-09-28 ENCOUNTER — Encounter: Payer: Self-pay | Admitting: Physician Assistant

## 2017-09-28 ENCOUNTER — Ambulatory Visit (INDEPENDENT_AMBULATORY_CARE_PROVIDER_SITE_OTHER): Payer: Medicare Other | Admitting: Physician Assistant

## 2017-09-28 VITALS — BP 118/62 | HR 103 | Temp 98.1°F | Resp 18 | Ht 61.0 in | Wt 257.6 lb

## 2017-09-28 DIAGNOSIS — F329 Major depressive disorder, single episode, unspecified: Secondary | ICD-10-CM

## 2017-09-28 DIAGNOSIS — I10 Essential (primary) hypertension: Secondary | ICD-10-CM | POA: Diagnosis not present

## 2017-09-28 DIAGNOSIS — E039 Hypothyroidism, unspecified: Secondary | ICD-10-CM | POA: Diagnosis not present

## 2017-09-28 DIAGNOSIS — F419 Anxiety disorder, unspecified: Secondary | ICD-10-CM | POA: Diagnosis not present

## 2017-09-28 DIAGNOSIS — E1142 Type 2 diabetes mellitus with diabetic polyneuropathy: Secondary | ICD-10-CM

## 2017-09-28 DIAGNOSIS — R600 Localized edema: Secondary | ICD-10-CM | POA: Diagnosis not present

## 2017-09-28 NOTE — Progress Notes (Signed)
Patient ID: [name] [name] Ceci MRN: [id number], DOB: 09-13-1950, 67 y.o. Date of Encounter: @DATE @  Chief Complaint:  Chief Complaint  Patient presents with  . dealing with depression    HPI: 67 y.o. year old female      09/01/2016: presents as a New Pt to [facility].   Her husband is also on my schedule for later today as a new patient to establish care. Pt reports that they received a letter that their primary PCP had left. Even tried a new doctor.  Did not like him so they are now establishing care here.  She reports that also they live very close by and they have family members who come here. Also notes that they have land near Dr. Samella Parr in-laws.  Reviewed that she sees the following specialists to manage the following medical conditions:  Dr. Ninfa Linden is in the same group as Dr. Sharol Given-- [facility]. She sees him for her knees etc. Dr. Sharol Given at [facility]--- for her feet Dr. Mariah Milling----- breast cancer Dr. Blanchard Mane radiation  Dr. Raelyn Mora---- Endocrinology--But they ONLY MANAGE HER DIABETES----  says that they have her in a research study -----------------They do NOT do her THYROID Dr. Dia Sitter her liver and spleen"    I then discussed her remaining medical problems which I will manage:  She states that she has been on the Celexa for about 10 years for anxiety and depression.  Says that in the past she was in therapy with Lajuana Ripple. Also says that she was on a lot of medicines for this in the past. Feels that the Celexa is working well and is completely controlling her symptoms. She does note that she has had the following 3 stressors just over the past one year: -46 year old Aunt who has cancer is now living with her -Her husband has been diagnosed with rheumatoid arthritis -She was diagnosed with breast cancer March 2017 However despite all of these stressors says that she does feel that her anxiety and depression are controlled and  stable.  She reports that she takes Lasix 20 mg every single day for the past 1-2 years. States that she takes this for lower extremity edema. Has no known history of heart failure.  She reports that she takes the gabapentin for neuropathy. Says that "it is bad in her feet and now is also having in her fingers". Does see Dr. Sharol Given -- as well-- for her feet.  She is on thyroid medication child will monitor. This is stable in regards to symptoms.  She is on losartan for blood pressure which I will monitor. She is having no adverse effects with this medicine is causing the lightheadedness.  She has no specific concerns to address today.   03/07/2017: She reports that everything has remained stable. Reports that she has continued to follow-up with all of her prior specialists and is taking all of those medications as directed. She has no specific concerns to address today. She continues to see Dr. Ninfa Linden and Dr. Sharol Given. She continues to see Dr. Jana Hakim for follow-up of breast cancer. She continues to see endocrinology for her diabetes. Continues to see Dr. Cristina Gong for her liver and spleen. Notes that her platelets were low the last time they were checked and she will continue to have these monitored by specialist. She reports that the Celexa continues to work well and is controlling her anxiety and depression symptoms and is causing no adverse effects. She reports that the current dose of Lasix is  working well at controlling her edema. She continues to take her thyroid medication daily. She continues to take blood pressure medication daily.  Having no lightheadedness or other adverse effects.  09/28/2017: Today she spends a lot of time talking about her aunt who is living with them that has cancer -- now is with hospice care-- at home with them.  States that "she is skin and bones".  Is on morphine.  Ms. Kuntzman discusses that the aunt hollers all night "help me, help me".  States that her aunt had  a stillbirth baby and that she cries out to that baby that died.  States that hospice has been very helpful and has provided a lot of information regarding all of these death processes and stages.  Says that her aunt has had very little intake for the past 2 to 3 weeks. Says that it has been overwhelming.   Also names off other issues that have been occurring.   States that her husband fell and had shoulder surgery.   Patient has recently had cataract surgery this month and will have to return for cataract surgery on the other eye.   She states that her siblings are having her stay with her mother 1 to 2 days/week.  Her mom has dementia.  She and the siblings are to be taking turns helping with the mom.   Says that her sister called her and said to "get your big girl britches on and help." Says that she thinks they do not understand how overwhelming caring for her aunt is.  States that she has not been able to leave the house secondary to her aunts need for continuous care. Again she spends a lot of time discussing all of the above during visit.  Otherwise from medical/health standpoint the remainder of her medical issue seems stable. She continues to see Dr. Ninfa Linden and Dr. Sharol Given. She continues to see Dr. Jana Hakim for follow-up of breast cancer. She continues to see endocrinology for her diabetes. Continues to see Dr. Cristina Gong for her liver and spleen. Notes that her platelets were low the last time they were checked and she will continue to have these monitored by specialist. She reports that the Celexa continues to work well and is controlling her anxiety and depression symptoms and is causing no adverse effects. She reports that the current dose of Lasix is working well at controlling her edema. She continues to take her thyroid medication daily. She continues to take blood pressure medication daily.  Having no lightheadedness or other adverse effects.     Past Medical History:  Diagnosis  Date  . Anxiety   . Arthritis   . Blood dyscrasia    low platelet count- sees [name] , [name] ( Note in [place] from 07/2014) at [facility]  . Breast cancer [facility]) 2017   Left Breast  . Cancer ([place])    left breast  . Cough   . Diabetes mellitus without complication ([place])   . Diabetic neuropathy ([place])   . Gout   . Gout   . History of radiation therapy 10/22/15 - 12/08/15   left breast 50.4 Gy, boost to 10 Gy  . Hypertension   . Migraine   . 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 Neuromuscular disorder ([place])    diabetic neuropathy  . Neuropathy   . Personal history of radiation therapy 2017   Left Breast Cancer  . Sleep apnea    CPAP nightly  . Thyroid disease  Home Meds: Outpatient Medications Prior to Visit  Medication Sig Dispense Refill  . acetaminophen (TYLENOL) 325 MG tablet Take 650 mg by mouth every 6 (six) hours as needed.    [name] anastrozole (ARIMIDEX) 1 MG tablet Take 1 tablet (1 mg total) by mouth daily. 90 tablet 4  . furosemide (LASIX) 20 MG tablet TAKE 1 TABLET BY MOUTH EVERY DAY AS NEEDED 90 tablet 0  . gabapentin (NEURONTIN) 600 MG tablet TAKE 1 TABLET IN THE AM. TAKE 1 TABLET IN THE AFTERNOON,TAKE 2 TABLETS AT BEDTIME. 360 tablet 1  . ibuprofen (ADVIL,MOTRIN) 200 MG tablet Take 200 mg by mouth every 6 (six) hours as needed.    . insulin glargine (LANTUS) 100 UNIT/ML injection Inject 20 Units into the skin at bedtime.    [name] levothyroxine (SYNTHROID, LEVOTHROID) 137 MCG tablet TAKE 1 TABLET BY MOUTH EVERY DAY 90 tablet 0  . Liraglutide (VICTOZA) 18 MG/3ML SOPN Inject 1.8 mg into the skin daily.    [name] losartan (COZAAR) 50 MG tablet Take 1 tablet by mouth every morning.     . metFORMIN (GLUCOPHAGE) 500 MG tablet Take by mouth 2 (two) times daily with a meal.    . methocarbamol (ROBAXIN) 500 MG tablet Take 500 mg by mouth 4 (four) times daily.    [name] loratadine (CLARITIN) 10 MG tablet Take 10 mg by mouth daily.    . citalopram (CELEXA) 20 MG  tablet TAKE 1 TABLET BY MOUTH EVERY DAY FOR ANXIETY (Patient not taking: Reported on 09/28/2017) 90 tablet 1  . gabapentin (NEURONTIN) 600 MG tablet TAKE 1 TABLET BY MOUTH DAILY. 90 tablet 0   No facility-administered medications prior to visit.     Allergies:  Allergies  Allergen Reactions  . Influenza A (H1n1) [place] Pf Anaphylaxis  . Codeine Swelling    Social History   Socioeconomic History  . Marital status: Married    Spouse name: Not on file  . Number of children: 3  . Years of education: Not on file  . Highest education level: Not on file  Occupational History  . Occupation: Retired    Comment: [place]  . Financial resource strain: Not on file  . Food insecurity:    Worry: Not on file    Inability: Not on file  . Transportation needs:    Medical: Not on file    Non-medical: Not on file  Tobacco Use  . Smoking status: Never Smoker  . Smokeless tobacco: Never Used  Substance and Sexual Activity  . Alcohol use: No  . Drug use: No  . Sexual activity: Not on file  Lifestyle  . Physical activity:    Days per week: Not on file    Minutes per session: Not on file  . Stress: Not on file  Relationships  . Social connections:    Talks on phone: Not on file    Gets together: Not on file    Attends religious service: Not on file    Active member of club or organization: Not on file    Attends meetings of clubs or organizations: Not on file    Relationship status: Not on file  . Intimate partner violence:    Fear of current or ex partner: Not on file    Emotionally abused: Not on file    Physically abused: Not on file    Forced sexual activity: Not on file  Other Topics Concern  . Not on file  Social History Narrative  .  Not on file    Family History  Problem Relation Age of Onset  . Bone cancer Paternal Grandmother   . Heart failure Paternal Grandmother   . Cancer Maternal Grandmother   . Heart failure Maternal Grandfather   .  Cancer Sister        GYN cancer     Review of Systems:  See HPI for pertinent ROS. All other ROS negative.    Physical Exam: Blood pressure 118/62, pulse (!) 103, temperature 98.1 F (36.7 C), temperature source Oral, resp. rate 18, height 5\' 1"  (1.549 m), weight 116.8 kg, SpO2 96 %., Body mass index is 48.67 kg/m. General: Severely obese WF. Appears in no acute distress. Neck: Supple. No thyromegaly. No lymphadenopathy. No carotid bruits. Lungs: Clear bilaterally to auscultation without wheezes, rales, or rhonchi. Breathing is unlabored. Heart: RRR with S1 S2. No murmurs, rubs, or gallops. Abdomen: Soft, non-tender, non-distended with normoactive bowel sounds. No hepatomegaly. No rebound/guarding. No obvious abdominal masses. Musculoskeletal:  Strength and tone normal for age. Extremities/Skin: Warm and dry.No LE edema.  Inspection of feet is normal.  She has no wounds or problematic areas. Neuro: Alert and oriented X 3. Moves all extremities spontaneously. Gait is normal. CNII-XII grossly in tact. Psych:  Responds to questions appropriately with a normal affect.     ASSESSMENT AND PLAN:  67 y.o. year old female with   1. Anxiety and depression 09/29/2017: She has a lot of burden right now with her aunt living in her house with hospice.  However she seems to be dealing with this appropriately.  Seems stable on her current dose of Celexa.  Stable/controlled. Continue current dose of Celexa.  2. Essential hypertension 09/29/2017:At goal. Continue current dose of medications. Check lab to monitor. - BASIC METABOLIC PANEL WITH GFR  3. Diabetic polyneuropathy associated with type 2 diabetes mellitus ([place]) 09/29/2017:She'll continue her current dose of gabapentin and will continue follow-up with Dr. Sharol Given  4. Bilateral lower extremity edema 09/29/2017:This is controlled. Continue current dose of Lasix. Check lab to monitor. - BASIC METABOLIC PANEL WITH GFR  5. Hypothyroidism,  unspecified type 09/29/2017: She is on levothyroxine. Check TSH to monitor. - TSH  6. Preventative care 09/29/2017: Both she and her husband are having routine visits today.  They report that it is been 1 year since her last CPE and they are interested in scheduling CPE.  However wife states that there is schedule is very full right now so they will schedule for CPE as their schedule allows. Well I discussed that for her most preventive care is already up-to-date because of all of her other medical issues and the specialist that she is already seeing.  Cancer screenings: Managed by oncology Immunizations: She reports that she knows that she received a pneumonia vaccine and a tetanus vaccine when she had her knee replacement about one year ago. She will try to get a report/record of this so that we can enter this into computer. She does not want to get a shingles vaccine. She says that she has had allergic reaction to flu shot in the past and will not be getting any further flu shots.   Discussed with patient that at this point everything seems to be well controlled and managed. Therefore feel that she can wait 6 months for follow-up routine visit. However she can return for follow-up sooner if feels that this is needed or for any acute illnesses in the interim. 03/07/2017: Both she and her husband are having routine  visits today.  They report that it is been 1 year since her last CPE and they are interested in scheduling CPE.  However wife states that there is schedule is very full right now so they will schedule for CPE as their schedule allows. As well, I discussed that for her most preventive care is already up-to-date because of all of her other medical issues and the specialist that she is already seeing.   Marin Olp Lott, [place], [facility] 09/28/2017 3:09 PM

## 2017-09-29 LAB — BASIC METABOLIC PANEL WITH GFR
BUN / CREAT RATIO: 17 (calc) (ref 6–22)
BUN: 19 mg/dL (ref 7–25)
CHLORIDE: 102 mmol/L (ref 98–110)
CO2: 25 mmol/L (ref 20–32)
Calcium: 10.3 mg/dL (ref 8.6–10.4)
Creat: 1.09 mg/dL — ABNORMAL HIGH (ref 0.50–0.99)
GFR, EST AFRICAN AMERICAN: 61 mL/min/{1.73_m2} (ref >=60)
GFR, EST NON AFRICAN AMERICAN: 52 mL/min/{1.73_m2} — AB (ref >=60)
Glucose, Bld: 213 mg/dL — ABNORMAL HIGH (ref 65–99)
Potassium: 3.7 mmol/L (ref 3.5–5.3)
SODIUM: 142 mmol/L (ref 135–146)

## 2017-09-29 LAB — TSH: TSH: 0.81 m[IU]/L (ref 0.40–4.50)

## 2017-09-30 ENCOUNTER — Other Ambulatory Visit: Payer: Self-pay

## 2017-09-30 MED ORDER — LOSARTAN POTASSIUM 50 MG PO TABS: 50.0000 mg | ORAL_TABLET | Freq: Every morning | ORAL | 2 refills | Status: DC

## 2017-10-04 ENCOUNTER — Telehealth: Payer: Self-pay

## 2017-10-04 MED ORDER — LOSARTAN POTASSIUM 25 MG PO TABS: 25.0000 mg | ORAL_TABLET | Freq: Two times a day (BID) | ORAL | 2 refills | Status: DC

## 2017-10-04 NOTE — Telephone Encounter (Signed)
Received fax from [facility] that losartan potassium 50 mg was on back order and they asked is they could dispense 25mg  BID. Will resend rx for 25mg  Bid

## 2017-10-06 ENCOUNTER — Ambulatory Visit: Payer: Medicare Other | Admitting: Oncology

## 2017-10-06 ENCOUNTER — Other Ambulatory Visit: Payer: Medicare Other
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
## 2017-10-11 ENCOUNTER — Inpatient Hospital Stay: Payer: Medicare Other | Attending: Oncology

## 2017-10-11 ENCOUNTER — Telehealth: Payer: Self-pay | Admitting: Oncology

## 2017-10-11 ENCOUNTER — Inpatient Hospital Stay (HOSPITAL_BASED_OUTPATIENT_CLINIC_OR_DEPARTMENT_OTHER): Payer: Medicare Other | Admitting: Oncology

## 2017-10-11 VITALS — BP 126/57 | HR 77 | Temp 97.9°F | Resp 18 | Ht 61.0 in | Wt 257.6 lb

## 2017-10-11 DIAGNOSIS — E1142 Type 2 diabetes mellitus with diabetic polyneuropathy: Secondary | ICD-10-CM

## 2017-10-11 DIAGNOSIS — Z170 Estrogen receptor positive status [ER+]: Secondary | ICD-10-CM | POA: Diagnosis not present

## 2017-10-11 DIAGNOSIS — Z923 Personal history of irradiation: Secondary | ICD-10-CM

## 2017-10-11 DIAGNOSIS — R161 Splenomegaly, not elsewhere classified: Secondary | ICD-10-CM | POA: Diagnosis not present

## 2017-10-11 DIAGNOSIS — E1143 Type 2 diabetes mellitus with diabetic autonomic (poly)neuropathy: Secondary | ICD-10-CM | POA: Diagnosis not present

## 2017-10-11 DIAGNOSIS — D6959 Other secondary thrombocytopenia: Secondary | ICD-10-CM

## 2017-10-11 DIAGNOSIS — C50412 Malignant neoplasm of upper-outer quadrant of left female breast: Secondary | ICD-10-CM | POA: Insufficient documentation

## 2017-10-11 DIAGNOSIS — Z79811 Long term (current) use of aromatase inhibitors: Secondary | ICD-10-CM

## 2017-10-11 DIAGNOSIS — D696 Thrombocytopenia, unspecified: Secondary | ICD-10-CM

## 2017-10-11 DIAGNOSIS — E1140 Type 2 diabetes mellitus with diabetic neuropathy, unspecified: Secondary | ICD-10-CM | POA: Diagnosis not present

## 2017-10-11 DIAGNOSIS — K7460 Unspecified cirrhosis of liver: Secondary | ICD-10-CM

## 2017-10-11 LAB — COMPREHENSIVE METABOLIC PANEL
ALBUMIN: 4 g/dL (ref 3.5–5.0)
ALT: 21 U/L (ref 0–44)
ANION GAP: 13 (ref 5–15)
AST: 29 U/L (ref 15–41)
Alkaline Phosphatase: 109 U/L (ref 38–126)
BILIRUBIN TOTAL: 0.6 mg/dL (ref 0.3–1.2)
BUN: 12 mg/dL (ref 8–23)
CHLORIDE: 101 mmol/L (ref 98–111)
CO2: 26 mmol/L (ref 22–32)
Calcium: 10.3 mg/dL (ref 8.9–10.3)
Creatinine, Ser: 1.12 mg/dL — ABNORMAL HIGH (ref 0.44–1.00)
GFR calc Af Amer: 58 mL/min — ABNORMAL LOW (ref >60)
GFR, EST NON AFRICAN AMERICAN: 50 mL/min — AB (ref >60)
Glucose, Bld: 288 mg/dL — ABNORMAL HIGH (ref 70–99)
POTASSIUM: 3.9 mmol/L (ref 3.5–5.1)
Sodium: 140 mmol/L (ref 135–145)
TOTAL PROTEIN: 8.1 g/dL (ref 6.5–8.1)

## 2017-10-11 LAB — CBC WITH DIFFERENTIAL/PLATELET
Basophils Absolute: 0 10*3/uL (ref 0.0–0.1)
Basophils Relative: 1 %
Eosinophils Absolute: 0.1 10*3/uL (ref 0.0–0.5)
Eosinophils Relative: 3 %
HCT: 36.4 % (ref 34.8–46.6)
Hemoglobin: 11.7 g/dL (ref 11.6–15.9)
Lymphocytes Relative: 21 %
Lymphs Abs: 0.7 10*3/uL — ABNORMAL LOW (ref 0.9–3.3)
MCH: 28.3 pg (ref 25.1–34.0)
MCHC: 32.2 g/dL (ref 31.5–36.0)
MCV: 87.9 fL (ref 79.5–101.0)
Monocytes Absolute: 0.2 10*3/uL (ref 0.1–0.9)
Monocytes Relative: 6 %
Neutro Abs: 2.4 10*3/uL (ref 1.5–6.5)
Neutrophils Relative %: 69 %
Platelets: 84 10*3/uL — ABNORMAL LOW (ref 145–400)
RBC: 4.14 MIL/uL (ref 3.70–5.45)
RDW: 15.7 % — ABNORMAL HIGH (ref 11.2–14.5)
WBC: 3.5 10*3/uL — ABNORMAL LOW (ref 3.9–10.3)

## 2017-10-11 MED ORDER — ANASTROZOLE 1 MG PO TABS: 1.0000 mg | ORAL_TABLET | Freq: Every day | ORAL | 4 refills | Status: DC

## 2017-10-11 NOTE — Telephone Encounter (Signed)
Gave patient avs and calendar.   °

## 2017-10-20 DIAGNOSIS — H2512 Age-related nuclear cataract, left eye: Secondary | ICD-10-CM | POA: Diagnosis not present

## 2017-10-20 DIAGNOSIS — H25812 Combined forms of age-related cataract, left eye: Secondary | ICD-10-CM | POA: Diagnosis not present

## 2017-10-20 DIAGNOSIS — H25012 Cortical age-related cataract, left eye: Secondary | ICD-10-CM | POA: Diagnosis not present

## 2017-11-10 ENCOUNTER — Other Ambulatory Visit: Payer: Self-pay | Admitting: Physician Assistant

## 2017-11-17 ENCOUNTER — Ambulatory Visit
Admission: RE | Admit: 2017-11-17 | Discharge: 2017-11-17 | Disposition: A | Discharge status: Home / Self Care | Payer: Medicare Other | Source: Ambulatory Visit | Attending: Oncology | Admitting: Oncology

## 2017-11-17 DIAGNOSIS — R928 Other abnormal and inconclusive findings on diagnostic imaging of breast: Secondary | ICD-10-CM | POA: Diagnosis not present

## 2017-11-17 DIAGNOSIS — C50412 Malignant neoplasm of upper-outer quadrant of left female breast: Secondary | ICD-10-CM

## 2017-11-17 DIAGNOSIS — Z853 Personal history of malignant neoplasm of breast: Secondary | ICD-10-CM | POA: Diagnosis not present

## 2017-11-17 DIAGNOSIS — E1142 Type 2 diabetes mellitus with diabetic polyneuropathy: Secondary | ICD-10-CM

## 2017-11-17 DIAGNOSIS — Z170 Estrogen receptor positive status [ER+]: Principal | ICD-10-CM

## 2017-12-06 ENCOUNTER — Other Ambulatory Visit: Payer: Self-pay | Admitting: Physician Assistant

## 2018-01-11 ENCOUNTER — Other Ambulatory Visit: Payer: Self-pay | Admitting: Gastroenterology

## 2018-01-11 DIAGNOSIS — K7460 Unspecified cirrhosis of liver: Secondary | ICD-10-CM

## 2018-01-16 ENCOUNTER — Ambulatory Visit
Admission: RE | Admit: 2018-01-16 | Discharge: 2018-01-16 | Disposition: A | Discharge status: Home / Self Care | Payer: Medicare Other | Source: Ambulatory Visit | Attending: Gastroenterology | Admitting: Gastroenterology

## 2018-01-16 DIAGNOSIS — K7581 Nonalcoholic steatohepatitis (NASH): Secondary | ICD-10-CM | POA: Diagnosis not present

## 2018-01-16 DIAGNOSIS — K7460 Unspecified cirrhosis of liver: Secondary | ICD-10-CM

## 2018-01-31 DIAGNOSIS — D696 Thrombocytopenia, unspecified: Secondary | ICD-10-CM | POA: Diagnosis not present

## 2018-01-31 DIAGNOSIS — E039 Hypothyroidism, unspecified: Secondary | ICD-10-CM | POA: Diagnosis not present

## 2018-01-31 DIAGNOSIS — E1143 Type 2 diabetes mellitus with diabetic autonomic (poly)neuropathy: Secondary | ICD-10-CM | POA: Diagnosis not present

## 2018-01-31 DIAGNOSIS — E785 Hyperlipidemia, unspecified: Secondary | ICD-10-CM | POA: Diagnosis not present

## 2018-01-31 DIAGNOSIS — G8929 Other chronic pain: Secondary | ICD-10-CM | POA: Diagnosis not present

## 2018-01-31 DIAGNOSIS — M170 Bilateral primary osteoarthritis of knee: Secondary | ICD-10-CM | POA: Diagnosis not present

## 2018-01-31 DIAGNOSIS — C50912 Malignant neoplasm of unspecified site of left female breast: Secondary | ICD-10-CM | POA: Diagnosis not present

## 2018-01-31 DIAGNOSIS — E1140 Type 2 diabetes mellitus with diabetic neuropathy, unspecified: Secondary | ICD-10-CM | POA: Diagnosis not present

## 2018-01-31 DIAGNOSIS — I10 Essential (primary) hypertension: Secondary | ICD-10-CM | POA: Diagnosis not present

## 2018-01-31 DIAGNOSIS — K7460 Unspecified cirrhosis of liver: Secondary | ICD-10-CM | POA: Diagnosis not present

## 2018-01-31 DIAGNOSIS — G4730 Sleep apnea, unspecified: Secondary | ICD-10-CM | POA: Diagnosis not present

## 2018-01-31 DIAGNOSIS — Z79899 Other long term (current) drug therapy: Secondary | ICD-10-CM | POA: Diagnosis not present

## 2018-02-06 ENCOUNTER — Ambulatory Visit (INDEPENDENT_AMBULATORY_CARE_PROVIDER_SITE_OTHER): Payer: Medicare Other

## 2018-02-06 ENCOUNTER — Other Ambulatory Visit: Payer: Self-pay | Admitting: Oncology

## 2018-02-06 ENCOUNTER — Ambulatory Visit (INDEPENDENT_AMBULATORY_CARE_PROVIDER_SITE_OTHER): Payer: Medicare Other | Admitting: Physician Assistant

## 2018-02-06 ENCOUNTER — Encounter (INDEPENDENT_AMBULATORY_CARE_PROVIDER_SITE_OTHER): Payer: Self-pay | Admitting: Physician Assistant

## 2018-02-06 VITALS — Ht 62.0 in | Wt 245.0 lb

## 2018-02-06 DIAGNOSIS — M7061 Trochanteric bursitis, right hip: Secondary | ICD-10-CM

## 2018-02-06 DIAGNOSIS — M19011 Primary osteoarthritis, right shoulder: Secondary | ICD-10-CM

## 2018-02-06 NOTE — Progress Notes (Signed)
Office Visit Note   Patient: [name] [name]           Date of Birth: 28-Dec-1950           MRN: [id number] Visit Date: 02/06/2018              Requested by: Orlena Sheldon, PA-C No address on file PCP: Orlena Sheldon, PA-C   Assessment & Plan: Visit Diagnoses:  1. Primary osteoarthritis of right shoulder   2. Trochanteric bursitis of right hip     Plan: Discussed with [name] [name] and her husband that she needs to get her glucose levels specifically her hemoglobin A1c down below 8 before we consider any type of right shoulder replacement.  Once her sugars are under better control.like for her to follow-up with Dr. Marlou Sa for possible right shoulder replacement.  In regards to her right hip trochanteric bursitis recommend she go to physical therapy for IT band stretching.  She is unable to take NSAIDs and due to her elevated glucose levels would not recommend a trochanteric injection at this time.  Follow-Up Instructions: Return if symptoms worsen or fail to improve.   Orders:  Orders Placed This Encounter  Procedures  . XR HIP UNILAT W OR W/O PELVIS 2-3 VIEWS RIGHT  . XR Shoulder Right   No orders of the defined types were placed in this encounter.     Procedures: No procedures performed   Clinical Data: No additional findings.   Subjective: Chief Complaint  Patient presents with  . Right Hip - Pain  . Right Shoulder - Pain    HPI [name] comes in today with right shoulder pain that began this past summer and is getting progressively worse.  Keeps her awake at night.  She notes diminished range of motion of the shoulder.  She is also having right hip pain.  Pain in hips been ongoing for approximately 5 months.  She does have direct diabetic neuropathy in both her hands and feet.  She reports that her glucose levels have been under poor control for some time now very glucose readings are usually around 200 she states her last hemoglobin A1c was 9 point something. Review of  Systems No fevers or chills.  Please see HPI  Objective: Vital Signs: Ht 5\' 2"  (1.575 m)   Wt 245 lb (111.1 kg)   BMI 44.81 kg/m   Physical Exam Constitutional:      Appearance: She is obese. She is not ill-appearing or diaphoretic.  Pulmonary:     Effort: Pulmonary effort is normal.  Neurological:     Mental Status: She is alert and oriented to person, place, and time.     Ortho Exam Bilateral shoulder she has decreased strength with external rotation of the right shoulder against resistance.  5 out of 5 strength with internal rotation bilateral shoulders against resistance.  Decreased external rotation of the right shoulder.  Forward flexion actively right shoulder to 90 degrees passively and bring her to approximately 100 3240 degrees but this quite painful. Right hip good range of motion without pain.  She has tenderness over the right trochanteric region. Specialty Comments:  No specialty comments available.  Imaging: Xr Hip Unilat W Or W/o Pelvis 2-3 Views Right  Result Date: 02/06/2018 AP pelvis and the lateral view of the right hip: No acute fractures bilateral hips well located.  Hip joints appear well preserved.  Xr Shoulder Right  Result Date: 02/06/2018 Right shoulder 3 views: End-stage arthritis  with flattening the humeral head.  The axillary view is underpenetrated therefore nondiagnostic.  Shoulders well located.  No acute fractures.    PMFS History: Patient Active Problem List   Diagnosis Date Noted  . Ulcer of right great toe due to diabetes mellitus ([place]) 11/16/2016  . Anxiety and depression 09/01/2016  . Bilateral lower extremity edema 09/01/2016  . Hypothyroidism 09/01/2016  . Diabetic polyneuropathy associated with type 2 diabetes mellitus ([place]) 06/03/2016  . Achilles tendon contracture, bilateral 06/03/2016  . Malignant neoplasm of upper-outer quadrant of left breast in female, estrogen receptor positive ([place]) 08/19/2015  . Discharge from left nipple  08/03/2015  . Abnormal mammogram of left breast 08/03/2015  . Osteoarthritis of right knee 02/28/2015  . Status post total right knee replacement 02/28/2015  . Low TSH level 08/02/2014  . Splenomegaly 07/30/2014  . Thrombocytopenia ([place]) 07/30/2014  . 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 Recurrent falls 01/12/2013  . Neuropathy, diabetic ([place]) 01/12/2013  . Morbid obesity ([place]) 01/12/2013  . Degenerative arthritis of spine 01/12/2013  . Knee pain 01/12/2013  . Cough 09/05/2012  . HBP (high blood pressure) 09/05/2012  . Chest wall contusion 12/09/2011  . Abdominal wall contusion 12/09/2011   Past Medical History:  Diagnosis Date  . Anxiety   . Arthritis   . Blood dyscrasia    low platelet count- sees [name] , [name] ( Note in [place] from 07/2014) at [facility]  . Breast cancer [facility]) 2017   Left Breast  . Cancer ([place])    left breast  . Cough   . Diabetes mellitus without complication ([place])   . Diabetic neuropathy ([place])   . Gout   . Gout   . History of radiation therapy 10/22/15 - 12/08/15   left breast 50.4 Gy, boost to 10 Gy  . Hypertension   . Migraine   . 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 Neuromuscular disorder ([place])    diabetic neuropathy  . Neuropathy   . Personal history of radiation therapy 2017   Left Breast Cancer  . Sleep apnea    CPAP nightly  . Thyroid disease     Family History  Problem Relation Age of Onset  . Bone cancer Paternal Grandmother   . Heart failure Paternal Grandmother   . Cancer Maternal Grandmother   . Heart failure Maternal Grandfather   . Cancer Sister        GYN cancer    Past Surgical History:  Procedure Laterality Date  . BREAST BIOPSY Left 08/04/2015   Procedure: LEFT BREAST BIOPSY WITH NEEDLE LOCALIZATION;  Surgeon: [name], MD;  Location: [facility];  Service: General;  Laterality: Left;  . BREAST DUCTAL SYSTEM EXCISION Left 08/04/2015   Procedure: LEFT EXCISION DUCTAL  SYSTEM BREAST;  Surgeon: [name], MD;  Location: [facility];  Service: General;  Laterality: Left;  . BREAST LUMPECTOMY Left 08/2015  . BREAST LUMPECTOMY WITH AXILLARY LYMPH NODE BIOPSY Left 09/12/2015   Procedure: RE-EXCISION OF LEFT BREAST LUMPECTOMY WITH LEFT AXILLARY LYMPH NODE BIOPSY;  Surgeon: [name], MD;  Location: [facility];  Service: General;  Laterality: Left;  . CHOLECYSTECTOMY    . JOINT REPLACEMENT     left knee  . TOTAL KNEE ARTHROPLASTY Right 02/28/2015   Procedure: RIGHT TOTAL KNEE ARTHROPLASTY;  Surgeon: [name], MD;  Location: [facility];  Service: Orthopedics;  Laterality: Right;  . TUBAL LIGATION     Social History   Occupational History  .  Occupation: Retired    Comment: [employer]  Tobacco Use  . Smoking status: Never Smoker  . Smokeless tobacco: Never Used  Substance and Sexual Activity  . Alcohol use: No  . Drug use: No  . Sexual activity: Not on file

## 2018-02-07 ENCOUNTER — Other Ambulatory Visit: Payer: Self-pay | Admitting: *Deleted

## 2018-02-07 MED ORDER — LEVOTHYROXINE SODIUM 137 MCG PO TABS: 137.0000 ug | ORAL_TABLET | Freq: Every day | ORAL | 0 refills | Status: DC

## 2018-03-02 ENCOUNTER — Encounter (INDEPENDENT_AMBULATORY_CARE_PROVIDER_SITE_OTHER): Payer: Self-pay

## 2018-03-03 DIAGNOSIS — R7309 Other abnormal glucose: Secondary | ICD-10-CM | POA: Diagnosis not present

## 2018-03-03 DIAGNOSIS — E1140 Type 2 diabetes mellitus with diabetic neuropathy, unspecified: Secondary | ICD-10-CM | POA: Diagnosis not present

## 2018-03-03 DIAGNOSIS — E1143 Type 2 diabetes mellitus with diabetic autonomic (poly)neuropathy: Secondary | ICD-10-CM | POA: Diagnosis not present

## 2018-03-03 DIAGNOSIS — E039 Hypothyroidism, unspecified: Secondary | ICD-10-CM | POA: Diagnosis not present

## 2018-03-15 DIAGNOSIS — E039 Hypothyroidism, unspecified: Secondary | ICD-10-CM | POA: Diagnosis not present

## 2018-03-18 ENCOUNTER — Other Ambulatory Visit: Payer: Self-pay

## 2018-03-18 ENCOUNTER — Emergency Department (HOSPITAL_COMMUNITY): Payer: Medicare Other

## 2018-03-18 ENCOUNTER — Encounter (HOSPITAL_COMMUNITY): Payer: Self-pay | Admitting: Radiology

## 2018-03-18 ENCOUNTER — Emergency Department (HOSPITAL_COMMUNITY)
Admission: EM | Admit: 2018-03-18 | Discharge: 2018-03-18 | Disposition: A | Discharge status: Home / Self Care | Payer: Medicare Other | Attending: Emergency Medicine | Admitting: Emergency Medicine

## 2018-03-18 DIAGNOSIS — I10 Essential (primary) hypertension: Secondary | ICD-10-CM | POA: Insufficient documentation

## 2018-03-18 DIAGNOSIS — R519 Headache, unspecified: Secondary | ICD-10-CM

## 2018-03-18 DIAGNOSIS — Z79899 Other long term (current) drug therapy: Secondary | ICD-10-CM | POA: Diagnosis not present

## 2018-03-18 DIAGNOSIS — Z794 Long term (current) use of insulin: Secondary | ICD-10-CM | POA: Diagnosis not present

## 2018-03-18 DIAGNOSIS — E039 Hypothyroidism, unspecified: Secondary | ICD-10-CM | POA: Insufficient documentation

## 2018-03-18 DIAGNOSIS — Z9049 Acquired absence of other specified parts of digestive tract: Secondary | ICD-10-CM | POA: Diagnosis not present

## 2018-03-18 DIAGNOSIS — R51 Headache: Secondary | ICD-10-CM | POA: Insufficient documentation

## 2018-03-18 DIAGNOSIS — F329 Major depressive disorder, single episode, unspecified: Secondary | ICD-10-CM | POA: Insufficient documentation

## 2018-03-18 DIAGNOSIS — Z96653 Presence of artificial knee joint, bilateral: Secondary | ICD-10-CM | POA: Insufficient documentation

## 2018-03-18 DIAGNOSIS — E119 Type 2 diabetes mellitus without complications: Secondary | ICD-10-CM | POA: Insufficient documentation

## 2018-03-18 DIAGNOSIS — Z853 Personal history of malignant neoplasm of breast: Secondary | ICD-10-CM | POA: Diagnosis not present

## 2018-03-18 DIAGNOSIS — F419 Anxiety disorder, unspecified: Secondary | ICD-10-CM | POA: Insufficient documentation

## 2018-03-18 DIAGNOSIS — I6522 Occlusion and stenosis of left carotid artery: Secondary | ICD-10-CM | POA: Diagnosis not present

## 2018-03-18 DIAGNOSIS — G43909 Migraine, unspecified, not intractable, without status migrainosus: Secondary | ICD-10-CM | POA: Diagnosis not present

## 2018-03-18 DIAGNOSIS — I4510 Unspecified right bundle-branch block: Secondary | ICD-10-CM | POA: Diagnosis not present

## 2018-03-18 LAB — COMPREHENSIVE METABOLIC PANEL
ALT: 29 U/L (ref 0–44)
AST: 43 U/L — ABNORMAL HIGH (ref 15–41)
Albumin: 4.1 g/dL (ref 3.5–5.0)
Alkaline Phosphatase: 74 U/L (ref 38–126)
Anion gap: 14 (ref 5–15)
BUN: 16 mg/dL (ref 8–23)
CALCIUM: 10.2 mg/dL (ref 8.9–10.3)
CO2: 22 mmol/L (ref 22–32)
Chloride: 104 mmol/L (ref 98–111)
Creatinine, Ser: 0.91 mg/dL (ref 0.44–1.00)
GFR calc Af Amer: >60 mL/min (ref >60)
GFR calc non Af Amer: >60 mL/min (ref >60)
Glucose, Bld: 164 mg/dL — ABNORMAL HIGH (ref 70–99)
Potassium: 3.7 mmol/L (ref 3.5–5.1)
Sodium: 140 mmol/L (ref 135–145)
Total Bilirubin: 0.9 mg/dL (ref 0.3–1.2)
Total Protein: 7.9 g/dL (ref 6.5–8.1)

## 2018-03-18 LAB — URINALYSIS, ROUTINE W REFLEX MICROSCOPIC
Bacteria, UA: NONE SEEN
Bilirubin Urine: NEGATIVE
Glucose, UA: >=500 mg/dL — AB
Hgb urine dipstick: NEGATIVE
Ketones, ur: NEGATIVE mg/dL
Leukocytes,Ua: NEGATIVE
Nitrite: NEGATIVE
PROTEIN: NEGATIVE mg/dL
Specific Gravity, Urine: 1.011 (ref 1.005–1.030)
pH: 5 (ref 5.0–8.0)

## 2018-03-18 LAB — CBC WITH DIFFERENTIAL/PLATELET
Abs Immature Granulocytes: 0.01 10*3/uL (ref 0.00–0.07)
BASOS PCT: 1 %
Basophils Absolute: 0 10*3/uL (ref 0.0–0.1)
Eosinophils Absolute: 0.1 10*3/uL (ref 0.0–0.5)
Eosinophils Relative: 3 %
HEMATOCRIT: 40.6 % (ref 36.0–46.0)
Hemoglobin: 12.7 g/dL (ref 12.0–15.0)
Immature Granulocytes: 0 %
Lymphocytes Relative: 27 %
Lymphs Abs: 1.1 10*3/uL (ref 0.7–4.0)
MCH: 28.1 pg (ref 26.0–34.0)
MCHC: 31.3 g/dL (ref 30.0–36.0)
MCV: 89.8 fL (ref 80.0–100.0)
Monocytes Absolute: 0.3 10*3/uL (ref 0.1–1.0)
Monocytes Relative: 7 %
Neutro Abs: 2.5 10*3/uL (ref 1.7–7.7)
Neutrophils Relative %: 62 %
Platelets: 86 10*3/uL — ABNORMAL LOW (ref 150–400)
RBC: 4.52 MIL/uL (ref 3.87–5.11)
RDW: 14.1 % (ref 11.5–15.5)
WBC: 4 10*3/uL (ref 4.0–10.5)
nRBC: 0 % (ref 0.0–0.2)

## 2018-03-18 LAB — CBG MONITORING, ED: Glucose-Capillary: 151 mg/dL — ABNORMAL HIGH (ref 70–99)

## 2018-03-18 LAB — RAPID URINE DRUG SCREEN, HOSP PERFORMED
Amphetamines: NOT DETECTED
Barbiturates: NOT DETECTED
Benzodiazepines: NOT DETECTED
Cocaine: NOT DETECTED
Opiates: NOT DETECTED
Tetrahydrocannabinol: NOT DETECTED

## 2018-03-18 MED ORDER — PROCHLORPERAZINE EDISYLATE 10 MG/2ML IJ SOLN
10.0000 mg | Freq: Once | INTRAMUSCULAR | Status: AC
  Administered 2018-03-18: 10 mg via INTRAVENOUS
  Filled 2018-03-18: qty 2

## 2018-03-18 MED ORDER — DIPHENHYDRAMINE HCL 50 MG/ML IJ SOLN
25.0000 mg | Freq: Once | INTRAMUSCULAR | Status: AC
  Administered 2018-03-18: 25 mg via INTRAVENOUS
  Filled 2018-03-18: qty 1

## 2018-03-18 MED ORDER — SODIUM CHLORIDE 0.9 % IV BOLUS
1000.0000 mL | Freq: Once | INTRAVENOUS | Status: AC
  Administered 2018-03-18: 1000 mL via INTRAVENOUS

## 2018-03-18 MED ORDER — IOPAMIDOL (ISOVUE-370) INJECTION 76%
75.0000 mL | Freq: Once | INTRAVENOUS | Status: AC | PRN
  Administered 2018-03-18: 75 mL via INTRAVENOUS

## 2018-03-18 NOTE — ED Provider Notes (Signed)
Medical screening examination/treatment/procedure(s) were conducted as a shared visit with non-physician practitioner(s) and myself.  I personally evaluated the patient during the encounter. Briefly, the patient is a 68 y.o. female with history of anxiety, migraines, hypertension who presents the ED with right-sided headache, right-sided neck pain.  Patient with normal vitals.  No fever.  Patient had sudden severe headache that started about 2 hours prior to arrival.  Has overall normal neurological exam.  Has tenderness over the right trapezius area.  However, has normal strength and sensation throughout.  No obvious sensation changes.  No drift, normal finger-to-nose finger.  Will obtain CTA of the head and neck to rule out any bleed.  Patient likely with migraine with tension component.  Will give IV Compazine, Benadryl.  Will get lab work as well.  CTA of the head and neck shows no acute findings.  No bleeding.  No aneurysm. Patient had improvement following headache cocktail.  No significant anemia, electrolyte abnormality, kidney injury.  Patient able to ambulate without any issues.  No concern for stroke at this time and patient likely with migraine headache.  Recommend increase hydration at home.  Recommend follow-up with primary care doctor and discharged from ED in good condition.  This chart was dictated using voice recognition software.  Despite best efforts to proofread,  errors can occur which can change the documentation meaning.     EKG Interpretation  Date/Time:  Saturday March 18 2018 11:50:21 EST Ventricular Rate:  73 PR Interval:    QRS Duration: 121 QT Interval:  392 QTC Calculation: 432 R Axis:   -20 Text Interpretation:  Sinus rhythm Right bundle branch block Confirmed by Lennice Sites [id number]) on 03/18/2018 4:59:02 PM          Lennice Sites, DO 03/18/18 1659

## 2018-03-18 NOTE — ED Notes (Signed)
Patient arrived in CT.

## 2018-03-18 NOTE — ED Provider Notes (Signed)
[facility] EMERGENCY DEPARTMENT Provider Note   CSN: [id number] Arrival date & time: 03/18/18  1135     History   Chief Complaint Chief Complaint  Patient presents with  . Weakness  . Dizziness    HPI [name] [name] is a 68 y.o. female who presents with cc of headache and dizziness.  She has a history of right-sided headaches.  Was on her way to [place] in [place] with her husband when she had sudden onset of a headache. She felt dizzy and had pain in her right shoulder and R leg. The patient has a hx of similar headaches. Denies photophobia, phonophobia, UL throbbing, N/V, visual changes, stiff neck, neck pain, rash, or "thunderclap" onset.  She states that she did not take thing prior to arrival.   HPI  Past Medical History:  Diagnosis Date  . Anxiety   . Arthritis   . Blood dyscrasia    low platelet count- sees [name] , [name] ( Note in [place] from 07/2014) at [facility]  . Breast cancer [facility]) 2017   Left Breast  . Cancer ([employer])    left breast  . Cough   . Diabetes mellitus without complication ([place])   . Diabetic neuropathy ([place])   . Gout   . Gout   . History of radiation therapy 10/22/15 - 12/08/15   left breast 50.4 Gy, boost to 10 Gy  . Hypertension   . Migraine   . 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 Neuromuscular disorder ([place])    diabetic neuropathy  . Neuropathy   . Personal history of radiation therapy 2017   Left Breast Cancer  . Sleep apnea    CPAP nightly  . Thyroid disease     Patient Active Problem List   Diagnosis Date Noted  . Ulcer of right great toe due to diabetes mellitus ([place]) 11/16/2016  . Anxiety and depression 09/01/2016  . Bilateral lower extremity edema 09/01/2016  . Hypothyroidism 09/01/2016  . Diabetic polyneuropathy associated with type 2 diabetes mellitus ([place]) 06/03/2016  . Achilles tendon contracture, bilateral 06/03/2016  . Malignant neoplasm of upper-outer quadrant of  left breast in female, estrogen receptor positive ([place]) 08/19/2015  . Discharge from left nipple 08/03/2015  . Abnormal mammogram of left breast 08/03/2015  . Osteoarthritis of right knee 02/28/2015  . Status post total right knee replacement 02/28/2015  . Low TSH level 08/02/2014  . Splenomegaly 07/30/2014  . Thrombocytopenia ([place]) 07/30/2014  . 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 Recurrent falls 01/12/2013  . Neuropathy, diabetic ([place]) 01/12/2013  . Morbid obesity ([place]) 01/12/2013  . Degenerative arthritis of spine 01/12/2013  . Knee pain 01/12/2013  . Cough 09/05/2012  . HBP (high blood pressure) 09/05/2012  . Chest wall contusion 12/09/2011  . Abdominal wall contusion 12/09/2011    Past Surgical History:  Procedure Laterality Date  . BREAST BIOPSY Left 08/04/2015   Procedure: LEFT BREAST BIOPSY WITH NEEDLE LOCALIZATION;  Surgeon: [name], MD;  Location: [facility];  Service: General;  Laterality: Left;  . BREAST DUCTAL SYSTEM EXCISION Left 08/04/2015   Procedure: LEFT EXCISION DUCTAL SYSTEM BREAST;  Surgeon: [name], MD;  Location: [facility];  Service: General;  Laterality: Left;  . BREAST LUMPECTOMY Left 08/2015  . BREAST LUMPECTOMY WITH AXILLARY LYMPH NODE BIOPSY Left 09/12/2015   Procedure: RE-EXCISION OF LEFT BREAST LUMPECTOMY WITH LEFT AXILLARY LYMPH NODE BIOPSY;  Surgeon: [name], MD;  Location: [facility];  Service: General;  Laterality: Left;  . CHOLECYSTECTOMY    . JOINT REPLACEMENT     left knee  . TOTAL KNEE ARTHROPLASTY Right 02/28/2015   Procedure: RIGHT TOTAL KNEE ARTHROPLASTY;  Surgeon: [name], MD;  Location: [facility];  Service: Orthopedics;  Laterality: Right;  . TUBAL LIGATION       OB History   No obstetric history on file.      Home Medications    Prior to Admission medications   Medication Sig Start Date End Date Taking? Authorizing Provider  acetaminophen (TYLENOL) 325 MG  tablet Take 650 mg by mouth every 6 (six) hours as needed.   Yes [provider]  anastrozole (ARIMIDEX) 1 MG tablet TAKE 1 TABLET BY MOUTH EVERY DAY 02/06/18  Yes Magrinat, Virgie Dad, MD  empagliflozin (JARDIANCE) 25 MG TABS tablet Take 25 mg by mouth daily.   Yes [provider]  furosemide (LASIX) 20 MG tablet TAKE 1 TABLET BY MOUTH EVERY DAY AS NEEDED. NEEDS OFFICE VISIT FOR FURTHER REFILLS Patient taking differently: Take 20 mg by mouth daily.  12/06/17  Yes Susy Frizzle, MD  gabapentin (NEURONTIN) 600 MG tablet TAKE 1 TABLET IN THE AM. TAKE 1 TABLET IN THE AFTERNOON,TAKE 2 TABLETS AT BEDTIME. Patient taking differently: Take 600-1,200 mg by mouth 3 (three) times daily. Take 1 tablet in the am. Take 1 tablet in the afternoon,Take 2 tablets at bedtime. 07/21/17  Yes Dena Billet B, PA-C  ibuprofen (ADVIL,MOTRIN) 200 MG tablet Take 200 mg by mouth every 6 (six) hours as needed.   Yes [provider]  insulin glargine (LANTUS) 100 UNIT/ML injection Inject 26 Units into the skin at bedtime.    Yes [provider]  Javier Docker Oil 300 MG CAPS Take 300 mg by mouth.   Yes [provider]  levothyroxine (SYNTHROID, LEVOTHROID) 137 MCG tablet Take 1 tablet (137 mcg total) by mouth daily. 02/07/18  Yes O[place]to, Modena Nunnery, MD  Liraglutide (VICTOZA) 18 MG/3ML SOPN Inject 1.8 mg into the skin daily. 06/05/13  Yes [provider]  losartan (COZAAR) 25 MG tablet Take 1 tablet (25 mg total) by mouth 2 (two) times daily. 10/04/17  Yes Orlena Sheldon, PA-C  Misc Natural Products (TART CHERRY ADVANCED) CAPS Take 1 capsule by mouth daily.   Yes [provider]  Multiple Vitamins-Minerals (MULTIVITAMIN WITH MINERALS) tablet Take 1 tablet by mouth daily.   Yes [provider]    Family History Family History  Problem Relation Age of Onset  . Bone cancer Paternal Grandmother   . Heart failure Paternal Grandmother   . Cancer Maternal Grandmother   . Heart  failure Maternal Grandfather   . Cancer Sister        GYN cancer    Social History Social History   Tobacco Use  . Smoking status: Never Smoker  . Smokeless tobacco: Never Used  Substance Use Topics  . Alcohol use: No  . Drug use: No     Allergies   Influenza a (h1n1) monoval pf and Codeine   Review of Systems Review of Systems  Ten systems reviewed and are negative for acute change, except as noted in the HPI.   Physical Exam Updated Vital Signs BP 109/64   Pulse 76   Resp 19   Ht 5\' 2"  (1.575 m)   Wt 113.4 kg   SpO2 97%   BMI 45.73 kg/m   Physical Exam Vitals signs and nursing note reviewed.  Constitutional:  General: She is not in acute distress.    Appearance: She is well-developed. She is not diaphoretic.  HENT:     Head: Normocephalic and atraumatic.  Eyes:     General: No scleral icterus.    Conjunctiva/sclera: Conjunctivae normal.     Pupils: Pupils are equal, round, and reactive to light.     Comments: No horizontal, vertical or rotational nystagmus  Neck:     Musculoskeletal: Normal range of motion and neck supple.     Comments: Full active and passive ROM without pain No midline or paraspinal tenderness No nuchal rigidity or meningeal signs Cardiovascular:     Rate and Rhythm: Normal rate and regular rhythm.  Pulmonary:     Effort: Pulmonary effort is normal. No respiratory distress.     Breath sounds: Normal breath sounds. No wheezing or rales.  Abdominal:     General: Bowel sounds are normal.     Palpations: Abdomen is soft.     Tenderness: There is no abdominal tenderness. There is no guarding or rebound.  Musculoskeletal: Normal range of motion.     Comments: TTP R shoulder tenderness   Lymphadenopathy:     Cervical: No cervical adenopathy.  Skin:    General: Skin is warm and dry.     Findings: No rash.  Neurological:     General: No focal deficit present.     Mental Status: She is alert and oriented to person, place, and  time.     Cranial Nerves: No cranial nerve deficit.     Motor: No abnormal muscle tone.     Coordination: Coordination normal.     Deep Tendon Reflexes: Reflexes are normal and symmetric.     Comments: Mental Status:  Alert, oriented, thought content appropriate. Speech fluent without evidence of aphasia. Able to follow 2 step commands without difficulty.  Cranial Nerves:  II:  Peripheral visual fields grossly normal, pupils equal, round, reactive to light III,IV, VI: ptosis not present, extra-ocular motions intact bilaterally  V,VII: smile symmetric, facial light touch sensation equal VIII: hearing grossly normal bilaterally  IX,X: midline uvula rise  XI: bilateral shoulder shrug equal and strong XII: midline tongue extension  Motor:  5/5 in upper and lower extremities bilaterally including strong and equal grip strength and dorsiflexion/plantar flexion Sensory: Pinprick and light touch normal in all extremities.  Deep Tendon Reflexes: 2+ and symmetric  Cerebellar: normal finger-to-nose with bilateral upper extremities Gait: normal gait and balance CV: distal pulses palpable throughout   Psychiatric:        Behavior: Behavior normal.        Thought Content: Thought content normal.        Judgment: Judgment normal.      ED Treatments / Results  Labs (all labs ordered are listed, but only abnormal results are displayed) Labs Reviewed  COMPREHENSIVE METABOLIC PANEL - Abnormal; Notable for the following components:      Result Value   Glucose, Bld 164 (*)    AST 43 (*)    All other components within normal limits  URINALYSIS, ROUTINE W REFLEX MICROSCOPIC - Abnormal; Notable for the following components:   Color, Urine STRAW (*)    Glucose, UA >=500 (*)    All other components within normal limits  CBC WITH DIFFERENTIAL/PLATELET - Abnormal; Notable for the following components:   Platelets 86 (*)    All other components within normal limits  CBG MONITORING, ED - Abnormal;  Notable for the following components:   Glucose-Capillary  151 (*)    All other components within normal limits  RAPID URINE DRUG SCREEN, HOSP PERFORMED    EKG None  Radiology Ct Angio Head W Or Wo Contrast  Result Date: 03/18/2018 CLINICAL DATA:  Sudden onset of right-sided headache, dizziness and right arm weakness 900 hours. EXAM: CT ANGIOGRAPHY HEAD AND NECK TECHNIQUE: Multidetector CT imaging of the head and neck was performed using the standard protocol during bolus administration of intravenous contrast. Multiplanar CT image reconstructions and MIPs were obtained to evaluate the vascular anatomy. Carotid stenosis measurements (when applicable) are obtained utilizing NASCET criteria, using the distal internal carotid diameter as the denominator. CONTRAST:  42mL ISOVUE-370 IOPAMIDOL (ISOVUE-370) INJECTION 76% COMPARISON:  01/30/2013 FINDINGS: CT HEAD FINDINGS Brain: Mild atrophy. No evidence of acute infarction, mass lesion, hemorrhage, hydrocephalus or extra-axial collection. Vascular: There is atherosclerotic calcification of the major vessels at the base of the brain. Skull: Negative Sinuses: Clear/normal Orbits: Normal Review of the MIP images confirms the above findings CTA NECK FINDINGS Aortic arch: Aortic arch appears normal. No atherosclerotic change. Branching pattern is normal without stenosis. Right carotid system: Common carotid artery widely patent to the bifurcation. Carotid bifurcation is normal without soft or calcified plaque. Cervical ICA is widely patent. Left carotid system: Common carotid artery widely patent to the bifurcation. Minimal calcified plaque at the carotid bifurcation but no stenosis. Cervical ICA is normal. Vertebral arteries: Both vertebral arteries widely patent at their origins and through the cervical region to the foramen magnum. Skeleton: Degenerative cervical spondylosis and facet arthropathy. Other neck: No mass or lymphadenopathy. Upper chest: Negative  Review of the MIP images confirms the above findings CTA HEAD FINDINGS Anterior circulation: Internal carotid arteries are widely patent through the skull base and siphon regions. There is siphon atherosclerotic calcification but no stenosis greater than 30%. The anterior and middle cerebral vessels are patent without proximal stenosis, aneurysm or vascular malformation. No occluded branches are identified. Posterior circulation: Both vertebral arteries are patent through the foramen magnum to the basilar. No basilar stenosis. Posterior circulation branch vessels are normal. Venous sinuses: Patent and normal. Anatomic variants: None significant. Delayed phase: No abnormal enhancement. Review of the MIP images confirms the above findings IMPRESSION: Negative study. Minimal atherosclerotic change. No stenosis, irregularity or dissection. No intracranial large or medium vessel occlusion. Electronically Signed   By: Nelson Chimes M.D.   On: 03/18/2018 12:35   Ct Angio Neck W And/or Wo Contrast  Result Date: 03/18/2018 CLINICAL DATA:  Sudden onset of right-sided headache, dizziness and right arm weakness 900 hours. EXAM: CT ANGIOGRAPHY HEAD AND NECK TECHNIQUE: Multidetector CT imaging of the head and neck was performed using the standard protocol during bolus administration of intravenous contrast. Multiplanar CT image reconstructions and MIPs were obtained to evaluate the vascular anatomy. Carotid stenosis measurements (when applicable) are obtained utilizing NASCET criteria, using the distal internal carotid diameter as the denominator. CONTRAST:  58mL ISOVUE-370 IOPAMIDOL (ISOVUE-370) INJECTION 76% COMPARISON:  01/30/2013 FINDINGS: CT HEAD FINDINGS Brain: Mild atrophy. No evidence of acute infarction, mass lesion, hemorrhage, hydrocephalus or extra-axial collection. Vascular: There is atherosclerotic calcification of the major vessels at the base of the brain. Skull: Negative Sinuses: Clear/normal Orbits: Normal  Review of the MIP images confirms the above findings CTA NECK FINDINGS Aortic arch: Aortic arch appears normal. No atherosclerotic change. Branching pattern is normal without stenosis. Right carotid system: Common carotid artery widely patent to the bifurcation. Carotid bifurcation is normal without soft or calcified plaque. Cervical ICA is widely  patent. Left carotid system: Common carotid artery widely patent to the bifurcation. Minimal calcified plaque at the carotid bifurcation but no stenosis. Cervical ICA is normal. Vertebral arteries: Both vertebral arteries widely patent at their origins and through the cervical region to the foramen magnum. Skeleton: Degenerative cervical spondylosis and facet arthropathy. Other neck: No mass or lymphadenopathy. Upper chest: Negative Review of the MIP images confirms the above findings CTA HEAD FINDINGS Anterior circulation: Internal carotid arteries are widely patent through the skull base and siphon regions. There is siphon atherosclerotic calcification but no stenosis greater than 30%. The anterior and middle cerebral vessels are patent without proximal stenosis, aneurysm or vascular malformation. No occluded branches are identified. Posterior circulation: Both vertebral arteries are patent through the foramen magnum to the basilar. No basilar stenosis. Posterior circulation branch vessels are normal. Venous sinuses: Patent and normal. Anatomic variants: None significant. Delayed phase: No abnormal enhancement. Review of the MIP images confirms the above findings IMPRESSION: Negative study. Minimal atherosclerotic change. No stenosis, irregularity or dissection. No intracranial large or medium vessel occlusion. Electronically Signed   By: Nelson Chimes M.D.   On: 03/18/2018 12:35    Procedures Procedures (including critical care time)  Medications Ordered in ED Medications  sodium chloride 0.9 % bolus 1,000 mL (1,000 mLs Intravenous New Bag/Given 03/18/18 1501)    prochlorperazine (COMPAZINE) injection 10 mg (10 mg Intravenous Given 03/18/18 1226)  diphenhydrAMINE (BENADRYL) injection 25 mg (25 mg Intravenous Given 03/18/18 1227)  sodium chloride 0.9 % bolus 1,000 mL (0 mLs Intravenous Stopped 03/18/18 1457)  iopamidol (ISOVUE-370) 76 % injection 75 mL (75 mLs Intravenous Contrast Given 03/18/18 1209)     Initial Impression / Assessment and Plan / ED Course  I have reviewed the triage vital signs and the nursing notes.  Pertinent labs & imaging results that were available during my care of the patient were reviewed by me and considered in my medical decision making (see chart for details).  Clinical Course as of Mar 18 1542  Sat Mar 18, 2018  1404 Glucose(!): 164 [AH]  1404 Platelets(!): 11 [AH]    Clinical Course User Index [AH] Margarita Mail, PA-C    Patient here with sudden onset of headache that progressively worsened. Emergent considerations for headache include subarachnoid hemorrhage, meningitis, temporal arteritis, glaucoma, cerebral ischemia, carotid/vertebral dissection, intracranial tumor, Venous sinus thrombosis, carbon monoxide poisoning, acute or chronic subdural hemorrhage.  Other considerations include: Migraine, Cluster headache, Hypertension, Caffeine, alcohol, or drug withdrawal, Pseudotumor cerebri, Arteriovenous malformation, Head injury, Neurocysticercosis, Post-lumbar puncture, Preeclampsia, Tension headache, Sinusitis, Cervical arthritis, Refractive error causing strain, Dental abscess, Otitis media, Temporomandibular joint syndrome, Depression, Somatoform disorder (e.g., somatization) Trigeminal neuralgia, Glossopharyngeal neuralgia. The patient's CT/ CTA of the head is negative for acute finding.s The patent has had a migraine cocktail with complete resulting of her sxs. She is ambulatory and at baseline. NO evidence of SAH. She has a normal neurologic examination. She was seen in shared visit with Dr. Ronnald Nian. She has  reproducible pain on the R side and and no weakness. I have no suspicion for ICH, Meningitis, or temporal arteritis. Pt is afebrile with no focal neuro deficits, nuchal rigidity, or change in vision. Pt is to follow up with PCP to discuss prophylactic medication. Pt verbalizes understanding and is agreeable with plan to dc.    Final Clinical Impressions(s) / ED Diagnoses   Final diagnoses:  Bad headache    ED Discharge Orders    None  Margarita Mail, PA-C 03/18/18 Sutton, Adam, DO 03/22/18 1524

## 2018-03-18 NOTE — ED Notes (Signed)
Due to urgency, ordering provider requested we not wait for labs and scan the patient now.

## 2018-03-18 NOTE — ED Notes (Signed)
Pt ambulated well with this RN and a tech.

## 2018-03-18 NOTE — ED Notes (Signed)
Pt given discharge instructions and follow up information. Pt given the opportunity to ask questions. Pt discharged from the ED.

## 2018-03-18 NOTE — Discharge Instructions (Addendum)
You are having a headache. No specific cause was found today for your headache. It [place] have been a migraine or other cause of headache. Stress, anxiety, fatigue, and depression are common triggers for headaches. Your headache today does not appear to be life-threatening or require hospitalization, but often the exact cause of headaches is not determined in the emergency department. Therefore, follow-up with your doctor is very important to find out what [place] have caused your headache, and whether or not you need any further diagnostic testing or treatment. Sometimes headaches can appear benign (not harmful), but then more serious symptoms can develop which should prompt an immediate re-evaluation by your doctor or the emergency department. ° °SEEK MEDICAL ATTENTION IF: ° °You develop possible problems with medications prescribed.  °The medications don't resolve your headache, if it recurs , or if you have multiple episodes of vomiting or can't take fluids. °You have a change from the usual headache. ° °RETURN IMMEDIATELY IF you develop a sudden, severe headache or confusion, become poorly responsive or faint, develop a fever above 100.4F or problem breathing, have a change in speech, vision, swallowing, or understanding, or develop new weakness, numbness, tingling, incoordination, or have a seizure. ° °

## 2018-03-18 NOTE — ED Triage Notes (Signed)
Pt reports a sudden onset, severe headache on the right side of her head that started just shortly after 0900. Pt also reports some weakness on the right side and dizziness.

## 2018-03-20 ENCOUNTER — Ambulatory Visit (INDEPENDENT_AMBULATORY_CARE_PROVIDER_SITE_OTHER): Payer: Medicare Other | Admitting: Internal Medicine

## 2018-03-20 ENCOUNTER — Encounter: Payer: Self-pay | Admitting: Internal Medicine

## 2018-03-20 VITALS — BP 114/62 | HR 92 | Resp 18 | Ht 62.0 in | Wt 263.8 lb

## 2018-03-20 DIAGNOSIS — E1165 Type 2 diabetes mellitus with hyperglycemia: Secondary | ICD-10-CM

## 2018-03-20 DIAGNOSIS — E1142 Type 2 diabetes mellitus with diabetic polyneuropathy: Secondary | ICD-10-CM | POA: Diagnosis not present

## 2018-03-20 DIAGNOSIS — Z794 Long term (current) use of insulin: Secondary | ICD-10-CM | POA: Diagnosis not present

## 2018-03-20 DIAGNOSIS — E039 Hypothyroidism, unspecified: Secondary | ICD-10-CM | POA: Diagnosis not present

## 2018-03-20 MED ORDER — DAPAGLIFLOZIN PROPANEDIOL 5 MG PO TABS: 5.0000 mg | ORAL_TABLET | Freq: Every day | ORAL | 6 refills | Status: DC

## 2018-03-20 NOTE — Patient Instructions (Addendum)
-   STOP Jardiance - Continue Victoza 1.8 mg daily - Continue Lantus at 26 units daily   - Check sugar 2 times a day (fasting and bedtime)

## 2018-03-20 NOTE — Progress Notes (Signed)
Name: [name] [name]  MRN/ DOB: [id number], 02-15-1950   Age/ Sex: 68 y.o., female    PCP: Alycia Rossetti, MD   Reason for Endocrinology Evaluation: Type 2 Diabetes Mellitus, hypothyroidism     Date of Initial Endocrinology Visit: 03/21/2018     PATIENT IDENTIFIER: [name] [place] is a 68 y.o. female with a past medical history of T2DM, hypothyroidism, HTN and Hx of breast Ca. . The patient presented for initial endocrinology clinic visit on 03/21/2018 for consultative assistance with her diabetes management.    HPI: Ms. Purk was    Diagnosed with T2DM  years Prior Medications tried/Intolerance: Metformin -GI side effects.  Vania Rea was started March 02, 2018 Currently checking blood sugars 2 x / day,  before breakfast and bedtime Hypoglycemia episodes : no    Hemoglobin A1c has ranged from 6.6% in 2017, peaking at 8.5%  In 2020 Patient required assistance for hypoglycemia: no Patient has required hospitalization within the last 1 year from hyper or hypoglycemia: no  In terms of diet, the patient voids sugar-sweetened beverages , she eats 2-3 meals a day, and snacks at times.  Of note, the patient has noted dizziness that was started 3 days ago, the dizziness is described as spinning sensation, this is triggered by movement, she has been evaluated in the ED for possible stroke.  Work-up negative as per patient and spouse today.  Hypothyroidism: She was diagnosed with hypothyroidism many years ago. She has been on LT-4 replacement , last TSH was within normal range 0.41 uIU/mL. She is on 135 mcg daily   She takes MVI around breakfast time She is not on biotin   HOME ENDOCRINE REGIMEN: Victoza 1.8 mg daily Lantus 26 units daily Jardiance - samples started 03/03/2018 by PCP   Levothyroxine 135 mcg daily    Statin: Yes ACE-I/ARB: yes Prior Diabetic Education: No   METER DOWNLOAD SUMMARY: Did not bring   DIABETIC COMPLICATIONS: Microvascular complications:    Neuropathy  Denies: CKD, retinopathy  Last eye exam: Completed   Macrovascular complications:   Denies: CAD, PVD, CVA   PAST HISTORY: Past Medical History:  Past Medical History:  Diagnosis Date  . Anxiety   . Arthritis   . Blood dyscrasia    low platelet count- sees [name] , [name] ( Note in [place] from 07/2014) at [facility]  . Breast cancer [facility]) 2017   Left Breast  . Cancer ([place])    left breast  . Cough   . Diabetes mellitus without complication ([place])   . Diabetic neuropathy ([place])   . Gout   . Gout   . History of radiation therapy 10/22/15 - 12/08/15   left breast 50.4 Gy, boost to 10 Gy  . Hypertension   . Migraine   . 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 Neuromuscular disorder ([place])    diabetic neuropathy  . Neuropathy   . Personal history of radiation therapy 2017   Left Breast Cancer  . Sleep apnea    CPAP nightly  . Thyroid disease    Past Surgical History:  Past Surgical History:  Procedure Laterality Date  . BREAST BIOPSY Left 08/04/2015   Procedure: LEFT BREAST BIOPSY WITH NEEDLE LOCALIZATION;  Surgeon: [name], MD;  Location: [place];  Service: General;  Laterality: Left;  . BREAST DUCTAL SYSTEM EXCISION Left 08/04/2015   Procedure: LEFT EXCISION DUCTAL SYSTEM BREAST;  Surgeon: [name], MD;  Location: [place];  Service:  General;  Laterality: Left;  . BREAST LUMPECTOMY Left 08/2015  . BREAST LUMPECTOMY WITH AXILLARY LYMPH NODE BIOPSY Left 09/12/2015   Procedure: RE-EXCISION OF LEFT BREAST LUMPECTOMY WITH LEFT AXILLARY LYMPH NODE BIOPSY;  Surgeon: [name], MD;  Location: [place];  Service: General;  Laterality: Left;  . CHOLECYSTECTOMY    . JOINT REPLACEMENT     left knee  . TOTAL KNEE ARTHROPLASTY Right 02/28/2015   Procedure: RIGHT TOTAL KNEE ARTHROPLASTY;  Surgeon: [name], MD;  [facility];  Service: Orthopedics;  Laterality: Right;  .  TUBAL LIGATION        Social History:  reports that she has never smoked. She has never used smokeless tobacco. She reports that she does not drink alcohol or use drugs. Family History:  Family History  Problem Relation Age of Onset  . Hypothyroidism Mother   . Bone cancer Paternal Grandmother   . Heart failure Paternal Grandmother   . Cancer Maternal Grandmother   . Heart failure Maternal Grandfather   . Cancer Sister        GYN cancer  . Hypothyroidism Sister      HOME MEDICATIONS: Allergies as of 03/20/2018      Reactions   Influenza A (h1n1) Monoval Pf Anaphylaxis   Codeine Swelling      Medication List       Accurate as of March 20, 2018 11:59 PM. Always use your most recent med list.        acetaminophen 325 MG tablet Commonly known as:  TYLENOL Take 650 mg by mouth every 6 (six) hours as needed.   anastrozole 1 MG tablet Commonly known as:  ARIMIDEX TAKE 1 TABLET BY MOUTH EVERY DAY   furosemide 20 MG tablet Commonly known as:  LASIX TAKE 1 TABLET BY MOUTH EVERY DAY AS NEEDED. NEEDS OFFICE VISIT FOR FURTHER REFILLS   gabapentin 600 MG tablet Commonly known as:  NEURONTIN TAKE 1 TABLET IN THE AM. TAKE 1 TABLET IN THE AFTERNOON,TAKE 2 TABLETS AT BEDTIME.   ibuprofen 200 MG tablet Commonly known as:  ADVIL,MOTRIN Take 200 mg by mouth every 6 (six) hours as needed.   JARDIANCE 25 MG Tabs tablet Generic drug:  empagliflozin Take 25 mg by mouth daily.   Krill Oil 300 MG Caps Take 300 mg by mouth.   LANTUS 100 UNIT/ML injection Generic drug:  insulin glargine Inject 26 Units into the skin at bedtime.   levothyroxine 137 MCG tablet Commonly known as:  SYNTHROID, LEVOTHROID Take 1 tablet (137 mcg total) by mouth daily.   losartan 25 MG tablet Commonly known as:  COZAAR Take 1 tablet (25 mg total) by mouth 2 (two) times daily.   multivitamin with minerals tablet Take 1 tablet by mouth daily.   TART CHERRY ADVANCED Caps Take 1 capsule by mouth  daily.   VICTOZA 18 MG/3ML Sopn Generic drug:  liraglutide Inject 1.8 mg into the skin daily.        ALLERGIES: Allergies  Allergen Reactions  . Influenza A (H1n1) Monoval Pf Anaphylaxis  . Codeine Swelling     REVIEW OF SYSTEMS: A comprehensive ROS was conducted with the patient and is negative except as per HPI and below:  Review of Systems  Constitutional: Positive for malaise/fatigue. Negative for weight loss.  Eyes: Negative for blurred vision and discharge.  Respiratory: Negative for cough and shortness of breath.   Cardiovascular: Negative for chest pain and palpitations.  Gastrointestinal: Negative for constipation and nausea.  Genitourinary: Positive for frequency.  Neurological: Positive for dizziness and tingling.  Endo/Heme/Allergies: Positive for polydipsia.  Psychiatric/Behavioral: Positive for depression. The patient is nervous/anxious.       OBJECTIVE:   VITAL SIGNS: BP 114/62 (BP Location: Left Arm, Patient Position: Sitting, Cuff Size: Large)   Pulse 92   Resp 18   Ht 5\' 2"  (1.575 m)   Wt 263 lb 12.8 oz (119.7 kg)   SpO2 96%   BMI 48.25 kg/m    PHYSICAL EXAM:  General: Pt appears well and is in NAD  Hydration: Well-hydrated with moist mucous membranes and good skin turgor  HEENT: Head: Unremarkable with good dentition. Oropharynx clear without exudate.  Eyes: External eye exam normal without stare, lid lag or exophthalmos.  EOM intact.  PERRL.  Neck: General: Supple without adenopathy or carotid bruits. Thyroid: Thyroid size normal.  No goiter or nodules appreciated. No thyroid bruit.  Lungs: Clear with good BS bilat with no rales, rhonchi, or wheezes  Heart: RRR with normal S1 and S2 and no gallops; no murmurs; no rub  Abdomen: Normoactive bowel sounds, soft, nontender, without masses or organomegaly palpable  Extremities:  Lower extremities - No pretibial edema. No lesions.  Skin: Normal texture and temperature to palpation. No rash noted.  No Acanthosis nigricans/skin tags. No lipohypertrophy.  Neuro: MS is good with appropriate affect, pt is alert and Ox3    DM foot exam: Deferred   DATA REVIEWED:  Lab Results  Component Value Date   HGBA1C 6.6 (H) 02/21/2015   Lab Results  Component Value Date   CREATININE 0.91 03/18/2018   02/2018 TSH 0.41 uIU/mL        ASSESSMENT / PLAN / RECOMMENDATIONS:   1) Type 2 Diabetes Mellitus, Poorly controlled, With neuropathic complications - Most recent A1c of 8.5 %. Goal A1c < 7.0 %.   Plan: GENERAL: I have discussed with the patient the pathophysiology of diabetes. We went over the natural progression of the disease. We talked about both insulin resistance and insulin deficiency. We stressed the importance of lifestyle changes including diet and exercise. I explained the complications associated with diabetes including retinopathy, nephropathy, neuropathy as well as increased risk of cardiovascular disease. We went over the benefit seen with glycemic control.  She has not seen a CDE in the past, today she declined a new referral stating her husband is her CDEbecause he is a diabetic himself and is on insulin pump.  I have tried to explain to her that seeing a CDE will be very helpful in helping her choose low-carb options, patient snacks on peanut butter and crackers, and she was under the impression that the salt in the crackers is the cause of high glucose.    I am concerned about her dizziness today, I am wondering if this has to do with starting the Jardiance as it works as a diuretic, and she is also on 20 mg of Lasix daily.  I am also not able to make drastic changes to her diabetes regimen today due to lack of glucose data.  I have explained to her the importance of having glucose data to make decisions about diabetes management.  She was advised to stop the Jardiance, to check her glucose fasting and bedtime, and bring the glucose log in 2 weeks and will make a final  decision.  She is intolerant to metformin  MEDICATIONS:  Stop Jardiance  Continue Victoza 1.8 mg daily  Continue Lantus 26 units daily   EDUCATION /  INSTRUCTIONS:  BG monitoring instructions: Patient is instructed to check her blood sugars 2 times a day, fasting and bedtime.  Call [facility] if: BG persistently < 70 or > 300. . I reviewed the Rule of 15 for the treatment of hypoglycemia in detail with the patient. Literature supplied.   2) Diabetic complications:   Eye: Does not have known diabetic retinopathy.   Neuro/ Feet: Does have known diabetic peripheral neuropathy.  Renal: Patient does not have known baseline CKD. She is on an ACEI/ARB at present.   3) Lipids: Patient is on a statin.    4) Hypertension: She is   at goal of < 140/90 mmHg.   5) Hypothyroidism :  -Patient is clinically euthyroid -She denies any local neck symptoms -She was recently switched from levothyroxine 175 MCG daily to 137 MCG daily, we will recheck TFTs 6 weeks -- Pt educated extensively on the correct way to take levothyroxine (first thing in the morning with water, 30 minutes before eating or taking other medications). - Pt encouraged to double dose the following day if she were to miss a dose given long half-life of levothyroxine.  Follow-up in 2 weeks   Signed electronically by: Mack Guise, MD  [facility]  [address] [place], [place] [address] Phone: [phone] FAX: [phone]   CC: Alycia Rossetti, [place] [address] Phone: [phone]  Fax: [phone]    Return to Endocrinology clinic as below: Future Appointments  Date Time Provider [place]  04/03/2018  1:20 PM , Melanie Crazier, MD [facility] None  11/07/2018 11:00 AM [facility] LAB 5 [facility] None  11/07/2018 11:30 AM Magrinat, Virgie Dad, MD [facility] None

## 2018-03-21 ENCOUNTER — Encounter: Payer: Self-pay | Admitting: Internal Medicine

## 2018-04-03 ENCOUNTER — Ambulatory Visit (INDEPENDENT_AMBULATORY_CARE_PROVIDER_SITE_OTHER): Payer: Medicare Other | Admitting: Internal Medicine

## 2018-04-03 ENCOUNTER — Other Ambulatory Visit: Payer: Self-pay

## 2018-04-03 ENCOUNTER — Encounter: Payer: Self-pay | Admitting: Internal Medicine

## 2018-04-03 ENCOUNTER — Ambulatory Visit: Payer: Medicare Other | Admitting: Physician Assistant

## 2018-04-03 VITALS — BP 118/64 | HR 89 | Ht 62.0 in | Wt 265.4 lb

## 2018-04-03 DIAGNOSIS — E039 Hypothyroidism, unspecified: Secondary | ICD-10-CM | POA: Diagnosis not present

## 2018-04-03 DIAGNOSIS — E1142 Type 2 diabetes mellitus with diabetic polyneuropathy: Secondary | ICD-10-CM | POA: Diagnosis not present

## 2018-04-03 DIAGNOSIS — Z794 Long term (current) use of insulin: Secondary | ICD-10-CM | POA: Diagnosis not present

## 2018-04-03 DIAGNOSIS — E1165 Type 2 diabetes mellitus with hyperglycemia: Secondary | ICD-10-CM

## 2018-04-03 LAB — T4, FREE: Free T4: 1.32 ng/dL (ref 0.60–1.60)

## 2018-04-03 LAB — TSH: TSH: 0.11 u[IU]/mL — ABNORMAL LOW (ref 0.35–4.50)

## 2018-04-03 NOTE — Progress Notes (Signed)
Name: [name] [name]  Age/ Sex: 68 y.o., female   MRN/ DOB: [id number], [birth year]     PCP: Alycia Rossetti, MD   Reason for Endocrinology Evaluation: Type 2 Diabetes Mellitus  Initial Endocrine Consultative Visit: 03/20/2018    PATIENT IDENTIFIER: [name] [name] is a 68 y.o. female with a past medical history of T2DM, hypothyroidism, HTN and Hx of breast Ca . The patient has followed with Endocrinology clinic since 03/20/2018 for consultative assistance with management of her diabetes.  DIABETIC HISTORY:  [name] was diagnosed with T2DM many years ago, she is intolerant to Metformin. She was started on Jardiance January, 2020 but due to dizziness this was stopped in February, 2020.She was continued on Victoza and Lantus at the time.  Her hemoglobin A1c has ranged from 6.6% in 2017, peaking at 8.5%  In 2020    Hypothyroidism: She was diagnosed with hypothyroidism many years ago. She has been on LT-4 replacement , last TSH was within normal range 0.41 uIU/mL. She is on 135 mcg daily      SUBJECTIVE:   During the last visit (03/20/2018): Her A1c was 8.5%. We stopped Jardiance due to dizziness. Continued victoza and lantus.   Today (04/03/2018): [name] is here for 2 week follow up on diabetes management.  She checks her blood sugars 2 times daily, preprandial to breakfast and bedtime. The patient has not had hypoglycemic episodes since the last clinic visit. Otherwise, the patient has not required any recent emergency interventions for hypoglycemia and has not had recent hospitalizations secondary to hyper or hypoglycemic episodes.  Her dizziness has resolved since stopping Jardiance.  ROS: As per HPI and as detailed below: Review of Systems  Constitutional: Positive for weight loss. Negative for fever.  Eyes: Positive for pain.  Respiratory: Negative for cough and shortness of breath.   Cardiovascular:  Negative for chest pain and palpitations.  Gastrointestinal: Negative for diarrhea and nausea.  Skin: Negative.       HOME DIABETES REGIMEN:   Continue Victoza 1.8 mg daily  Continue Lantus 26 units daily    GLUCOSE LOG:       HISTORY:  Past Medical History:  Past Medical History:  Diagnosis Date  . Anxiety   . Arthritis   . Blood dyscrasia    low platelet count- sees [name] , [name] ( Note in [place] from 07/2014) at [facility]  . Breast cancer [facility]) 2017   Left Breast  . Cancer ([place])    left breast  . Cough   . Diabetes mellitus without complication ([place])   . Diabetic neuropathy ([place])   . Gout   . Gout   . History of radiation therapy 10/22/15 - 12/08/15   left breast 50.4 Gy, boost to 10 Gy  . Hypertension   . Migraine   . 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 Neuromuscular disorder ([place])    diabetic neuropathy  . Neuropathy   . Personal history of radiation therapy 2017   Left Breast Cancer  . Sleep apnea    CPAP nightly  . Thyroid disease    Past Surgical History:  Past Surgical History:  Procedure Laterality Date  . BREAST BIOPSY Left 08/04/2015   Procedure: LEFT BREAST BIOPSY WITH NEEDLE LOCALIZATION;  Surgeon: [name], MD;  Location: [place];  Service: General;  Laterality: Left;  . BREAST DUCTAL SYSTEM EXCISION Left 08/04/2015   Procedure: LEFT EXCISION DUCTAL SYSTEM BREAST;  Surgeon: [name], MD;  Location: [place];  Service: General;  Laterality: Left;  . BREAST LUMPECTOMY Left 08/2015  . BREAST LUMPECTOMY WITH AXILLARY LYMPH NODE BIOPSY Left 09/12/2015   Procedure: RE-EXCISION OF LEFT BREAST LUMPECTOMY WITH LEFT AXILLARY LYMPH NODE BIOPSY;  Surgeon: [name], MD;  Location: [place];  Service: General;  Laterality: Left;  . CHOLECYSTECTOMY    . JOINT REPLACEMENT     left knee  . TOTAL KNEE ARTHROPLASTY Right 02/28/2015   Procedure: RIGHT TOTAL KNEE  ARTHROPLASTY;  Surgeon: [name], MD;  [facility];  Service: Orthopedics;  Laterality: Right;  . TUBAL LIGATION      Social History:  reports that she has never smoked. She has never used smokeless tobacco. She reports that she does not drink alcohol or use drugs. Family History:  Family History  Problem Relation Age of Onset  . Hypothyroidism Mother   . Bone cancer Paternal Grandmother   . Heart failure Paternal Grandmother   . Cancer Maternal Grandmother   . Heart failure Maternal Grandfather   . Cancer Sister        GYN cancer  . Hypothyroidism Sister      HOME MEDICATIONS: Allergies as of 04/03/2018      Reactions   Influenza A (h1n1) Monoval Pf Anaphylaxis   Codeine Swelling   Jardiance [empagliflozin] Other (See Comments)   dehydration      Medication List       Accurate as of April 03, 2018  1:40 PM. Always use your most recent med list.        acetaminophen 325 MG tablet Commonly known as:  TYLENOL Take 650 mg by mouth every 6 (six) hours as needed.   anastrozole 1 MG tablet Commonly known as:  ARIMIDEX TAKE 1 TABLET BY MOUTH EVERY DAY   furosemide 20 MG tablet Commonly known as:  LASIX TAKE 1 TABLET BY MOUTH EVERY DAY AS NEEDED. NEEDS OFFICE VISIT FOR FURTHER REFILLS   gabapentin 600 MG tablet Commonly known as:  NEURONTIN TAKE 1 TABLET IN THE AM. TAKE 1 TABLET IN THE AFTERNOON,TAKE 2 TABLETS AT BEDTIME.   ibuprofen 200 MG tablet Commonly known as:  ADVIL,MOTRIN Take 200 mg by mouth every 6 (six) hours as needed.   Krill Oil 300 MG Caps Take 300 mg by mouth.   LANTUS 100 UNIT/ML injection Generic drug:  insulin glargine Inject 26 Units into the skin at bedtime.   levothyroxine 137 MCG tablet Commonly known as:  SYNTHROID, LEVOTHROID Take 1 tablet (137 mcg total) by mouth daily.   losartan 25 MG tablet Commonly known as:  COZAAR Take 1 tablet (25 mg total) by mouth 2 (two) times daily.   multivitamin with minerals  tablet Take 1 tablet by mouth daily.   TART CHERRY ADVANCED Caps Take 1 capsule by mouth daily.   VICTOZA 18 MG/3ML Sopn Generic drug:  liraglutide Inject 1.8 mg into the skin daily.        OBJECTIVE:   Vital Signs: BP 118/64 (BP Location: Right Arm, Patient Position: Sitting, Cuff Size: Normal)   Pulse 89   Ht 5\' 2"  (1.575 m)   Wt 265 lb 6.4 oz (120.4 kg)   SpO2 97%   BMI 48.54 kg/m   Wt Readings from Last 3 Encounters:  04/03/18 265 lb 6.4 oz (120.4 kg)  03/20/18 263 lb 12.8 oz (119.7 kg)  03/18/18 250 lb (113.4 kg)     Exam: General: Pt appears well and is in  NAD  Lungs: Clear with good BS bilat with no rales, rhonchi, or wheezes  Heart: RRR with normal S1 and S2 and no gallops; no murmurs; no rub  Extremities: No pretibial edema. No tremor.   Skin: Normal texture and temperature to palpation. No rash noted. No Acanthosis nigricans/skin tags. No lipohypertrophy.  Neuro: MS is good with appropriate affect, pt is alert and Ox3    DM foot exam:04/03/2018 The skin of the feet is intact without sores or ulcerations. The pedal pulses are 2+ on right and 2+ on left. The sensation is absent to a screening 5.07, 10 gram monofilament bilaterally     DATA REVIEWED:  Lab Results  Component Value Date   HGBA1C 6.6 (H) 02/21/2015   Lab Results  Component Value Date   CREATININE 0.91 03/18/2018       03/03/2018 A1c 8.5 %  Results for ARTAVIA, JEANLOUIS (MRN [id number]) as of 04/04/2018 09:12  Ref. Range 04/03/2018 14:04  TSH Latest Ref Range: 0.35 - 4.50 uIU/mL 0.11 (L)  T4,Free(Direct) Latest Ref Range: 0.60 - 1.60 ng/dL 1.32    ASSESSMENT / PLAN / RECOMMENDATIONS:   1) Type 2 Diabetes Mellitus, Poorly controlled, With neuropathic complications - Most recent A1c of 8.5 %. Goal A1c < 7.0 %.    -In review of her glucose logs today, her BG's have been acceptable at less than 150 mg/dL. -She is intolerant to metformin, the regular and extended release alike. -She continues  to be under a research study to [facility], she has 1 more year to go. -She was not able to tolerate the Jardiance due to dizziness.  I do believe this is due to the diuretic effect as well as continuing Lasix. -At this time will increase her Lantus by 10%.  We will continue Victoza.  Patient is not a candidate for DPP 4 inhibitors, as she is on a GLP-1 agonist already.  She is not a candidate for metformin due to intolerance.  She is not a candidate for pioglitazone due to lower extremity edema.  Sulfonylureas are not favorable in conjunction with Lantus due to the risk of hypoglycemia. -I have expressed to the patient that we are limited on treating her diabetes at this time, due to the above.  We [place] consider restarting a small dose of SGLT2 inhibitor if needed with caution. -Praised the patient on continued lifestyle changes and medication with compliance and sugar checks.     MEDICATIONS:  Increase Lantus to 28 units daily  Continue Victoza at 1.8 mg daily  EDUCATION / INSTRUCTIONS:  BG monitoring instructions: Patient is instructed to check her blood sugars 2 times a day, fasting and bedtime.  Call [facility] if: BG persistently < 70 or > 300. . I reviewed the Rule of 15 for the treatment of hypoglycemia in detail with the patient. Literature supplied.  2)Hypothyroidism :  -Patient is clinically euthyroid -She denies any local neck symptoms -She was  switched from levothyroxine 175 MCG daily to 137 MCG daily, repeat TFT's today continue to show low TSH , will adjust the dose as below  -Pt educated extensively on the correct way to take levothyroxine (first thing in the morning with water, 30 minutes before eating or taking other medications). - Pt encouraged to double dose the following day if she were to miss a dose given long half-life of levothyroxine.  Medications Stop Levothyroxine 137 mcg daily  Start Levothyroxine 112 mcg daily  Will recheck on  next  visit   F/U in 6 weeks   Signed electronically by: Mack Guise, MD  [facility] Endocrin[facility]  [address] [place], [place] [address] Phone: [phone] FAX: [phone]   CC: Alycia Rossetti, [place] [address] Phone: [phone]  Fax: [phone]  Return to Endocrinology clinic as below: Future Appointments  Date Time Provider [place]  11/07/2018 11:00 AM [facility] 5 [facility] None  11/07/2018 11:30 AM Magrinat, Virgie Dad, MD [facility] None

## 2018-04-03 NOTE — Patient Instructions (Signed)
-   Increase Lantus to 28 units daily  - Continue Victoza 1.8 mg daily  - Continue chacking sugar twice a day (fasting and bedtime )

## 2018-04-04 MED ORDER — LEVOTHYROXINE SODIUM 112 MCG PO TABS: 112.0000 ug | ORAL_TABLET | Freq: Every day | ORAL | 3 refills | Status: DC

## 2018-04-10 ENCOUNTER — Encounter: Payer: Self-pay | Admitting: Internal Medicine

## 2018-04-26 DIAGNOSIS — E039 Hypothyroidism, unspecified: Secondary | ICD-10-CM | POA: Diagnosis not present

## 2018-05-02 DIAGNOSIS — E1143 Type 2 diabetes mellitus with diabetic autonomic (poly)neuropathy: Secondary | ICD-10-CM | POA: Diagnosis not present

## 2018-05-02 DIAGNOSIS — K7460 Unspecified cirrhosis of liver: Secondary | ICD-10-CM | POA: Diagnosis not present

## 2018-05-02 DIAGNOSIS — F419 Anxiety disorder, unspecified: Secondary | ICD-10-CM | POA: Diagnosis not present

## 2018-05-02 DIAGNOSIS — R42 Dizziness and giddiness: Secondary | ICD-10-CM | POA: Diagnosis not present

## 2018-05-02 DIAGNOSIS — E039 Hypothyroidism, unspecified: Secondary | ICD-10-CM | POA: Diagnosis not present

## 2018-05-02 DIAGNOSIS — I10 Essential (primary) hypertension: Secondary | ICD-10-CM | POA: Diagnosis not present

## 2018-05-03 DIAGNOSIS — Z0000 Encounter for general adult medical examination without abnormal findings: Secondary | ICD-10-CM | POA: Diagnosis not present

## 2018-05-10 ENCOUNTER — Other Ambulatory Visit (INDEPENDENT_AMBULATORY_CARE_PROVIDER_SITE_OTHER): Payer: Self-pay | Admitting: Physician Assistant

## 2018-05-10 ENCOUNTER — Telehealth (INDEPENDENT_AMBULATORY_CARE_PROVIDER_SITE_OTHER): Payer: Self-pay

## 2018-05-10 MED ORDER — ACETAMINOPHEN-CODEINE #3 300-30 MG PO TABS: 1.0000 | ORAL_TABLET | Freq: Four times a day (QID) | ORAL | 0 refills | Status: DC | PRN

## 2018-05-10 NOTE — Telephone Encounter (Signed)
Patient states she is having a lot of pain in her shoulder, asking if she can have something for pain until she is able to be seen for total shoulder replacement

## 2018-05-10 NOTE — Telephone Encounter (Signed)
Sent Tylenol #3

## 2018-05-10 NOTE — Telephone Encounter (Signed)
LMOM for patient letting her know we called this in for her

## 2018-05-11 ENCOUNTER — Other Ambulatory Visit: Payer: Self-pay

## 2018-05-15 ENCOUNTER — Encounter: Payer: Self-pay | Admitting: Internal Medicine

## 2018-05-15 ENCOUNTER — Other Ambulatory Visit: Payer: Self-pay

## 2018-05-15 ENCOUNTER — Ambulatory Visit (INDEPENDENT_AMBULATORY_CARE_PROVIDER_SITE_OTHER): Payer: Medicare Other | Admitting: Internal Medicine

## 2018-05-15 VITALS — BP 118/64 | HR 82 | Resp 97 | Ht 61.0 in | Wt 266.6 lb

## 2018-05-15 DIAGNOSIS — E1142 Type 2 diabetes mellitus with diabetic polyneuropathy: Secondary | ICD-10-CM

## 2018-05-15 DIAGNOSIS — Z794 Long term (current) use of insulin: Secondary | ICD-10-CM | POA: Diagnosis not present

## 2018-05-15 DIAGNOSIS — E039 Hypothyroidism, unspecified: Secondary | ICD-10-CM

## 2018-05-15 LAB — POCT GLYCOSYLATED HEMOGLOBIN (HGB A1C): Hemoglobin A1C: 7.1 % — AB (ref 4.0–5.6)

## 2018-05-15 NOTE — Progress Notes (Signed)
Name: [name] [name]  Age/ Sex: 68 y.o., female   MRN/ DOB: [id number], 1950/03/07     PCP: Dianna Rossetti, NP (Inactive)   Reason for Endocrinology Evaluation: Type 2 Diabetes Mellitus  Initial Endocrine Consultative Visit: 03/20/2018    PATIENT IDENTIFIER: [name] is a 68 y.o. female with a past medical history of T2DM, hypothyroidism, HTN and Hx of breast Ca . The patient has followed with Endocrinology clinic since 03/20/2018 for consultative assistance with management of her diabetes.  DIABETIC HISTORY:  Ms. Karow was diagnosed with T2DM many years ago, she is intolerant to Metformin. She was started on Jardiance January, 2020 but due to dizziness this was stopped in February, 2020.She was continued on Victoza and Lantus at the time.  Her hemoglobin A1c has ranged from 6.6% in 2017, peaking at 8.5%  In 2020    Hypothyroidism: She was diagnosed with hypothyroidism many years ago. She has been on LT-4 replacement , last TSH was within normal range 0.41 uIU/mL. She is on 135 mcg daily   SUBJECTIVE:   During the last visit (04/03/2018): We increased Lantus to 28 units and continued victoza at 1.8   Today (05/15/2018): [name] is here for 6 week follow up on diabetes management.  She checks her blood sugars 2 times daily, preprandial to breakfast and bedtime. The patient has not had hypoglycemic episodes since the last clinic visit. Otherwise, the patient has not required any recent emergency interventions for hypoglycemia and has not had recent hospitalizations secondary to hyper or hypoglycemic episodes.  She has another bout of dizziness, and is diagnosed with vertigo, meclizine has helped.   ROS: As per HPI and as detailed below: Review of Systems  Constitutional: Negative for fever and weight loss.  Eyes: Negative for pain.  Respiratory: Negative for cough and shortness of breath.    Cardiovascular: Negative for chest pain and palpitations.  Gastrointestinal: Negative for diarrhea and nausea.  Genitourinary: Negative for frequency.  Skin: Negative.   Neurological: Positive for dizziness.  Endo/Heme/Allergies: Positive for polydipsia.      HOME DIABETES REGIMEN:   Continue Victoza 1.8 mg daily  Continue Lantus 28 units daily    GLUCOSE LOG:  Date Fasting Bedtime  05/15/2018 141   4/12 168 207  4/11 132 175  4/10 142 164  4/9 156 158  4/8 143 160  4/7 162 167  4/6 164 160       HISTORY:  Past Medical History:  Past Medical History:  Diagnosis Date  . Anxiety   . Arthritis   . Blood dyscrasia    low platelet count- sees [name] , [name] ( Note in [place] from 07/2014) at [facility]  . Breast cancer [facility]) 2017   Left Breast  . Cancer ([place])    left breast  . Cough   . Diabetes mellitus without complication ([place])   . Diabetic neuropathy ([place])   . Gout   . Gout   . History of radiation therapy 10/22/15 - 12/08/15   left breast 50.4 Gy, boost to 10 Gy  . Hypertension   . Migraine   . 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 Neuromuscular disorder ([place])    diabetic neuropathy  . Neuropathy   . Personal history of radiation therapy 2017   Left Breast Cancer  . Sleep apnea    CPAP nightly  . Thyroid disease    Past Surgical History:  Past Surgical History:  Procedure Laterality Date  .  BREAST BIOPSY Left 08/04/2015   Procedure: LEFT BREAST BIOPSY WITH NEEDLE LOCALIZATION;  Surgeon: [name], MD;  Location: [place];  Service: General;  Laterality: Left;  . BREAST DUCTAL SYSTEM EXCISION Left 08/04/2015   Procedure: LEFT EXCISION DUCTAL SYSTEM BREAST;  Surgeon: [name], MD;  Location: [place];  Service: General;  Laterality: Left;  . BREAST LUMPECTOMY Left 08/2015  . BREAST LUMPECTOMY WITH AXILLARY LYMPH NODE BIOPSY Left 09/12/2015   Procedure: RE-EXCISION OF LEFT BREAST  LUMPECTOMY WITH LEFT AXILLARY LYMPH NODE BIOPSY;  Surgeon: [name], MD;  Location: [place];  Service: General;  Laterality: Left;  . CHOLECYSTECTOMY    . JOINT REPLACEMENT     left knee  . TOTAL KNEE ARTHROPLASTY Right 02/28/2015   Procedure: RIGHT TOTAL KNEE ARTHROPLASTY;  Surgeon: [name], MD;  Location: [facility];  Service: Orthopedics;  Laterality: Right;  . TUBAL LIGATION      Social History:  reports that she has never smoked. She has never used smokeless tobacco. She reports that she does not drink alcohol or use drugs. Family History:  Family History  Problem Relation Age of Onset  . Hypothyroidism Mother   . Bone cancer Paternal Grandmother   . Heart failure Paternal Grandmother   . Cancer Maternal Grandmother   . Heart failure Maternal Grandfather   . Cancer Sister        GYN cancer  . Hypothyroidism Sister      HOME MEDICATIONS: Allergies as of 05/15/2018      Reactions   Influenza A (h1n1) Monoval Pf Anaphylaxis   Codeine Swelling   Jardiance [empagliflozin] Other (See Comments)   dehydration      Medication List       Accurate as of May 15, 2018  1:41 PM. Always use your most recent med list.        acetaminophen 325 MG tablet Commonly known as:  TYLENOL Take 650 mg by mouth every 6 (six) hours as needed.   acetaminophen-codeine 300-30 MG tablet Commonly known as:  TYLENOL #3 Take 1 tablet by mouth every 6 (six) hours as needed for moderate pain.   anastrozole 1 MG tablet Commonly known as:  ARIMIDEX TAKE 1 TABLET BY MOUTH EVERY DAY   fluticasone 50 MCG/ACT nasal spray Commonly known as:  FLONASE USE 1 SPRAY IN EACH NOSTRIL TWICE A DAY FOR 7 DAYS   furosemide 20 MG tablet Commonly known as:  LASIX TAKE 1 TABLET BY MOUTH EVERY DAY AS NEEDED. NEEDS OFFICE VISIT FOR FURTHER REFILLS   gabapentin 600 MG tablet Commonly known as:  NEURONTIN TAKE 1 TABLET IN THE AM. TAKE 1 TABLET IN THE AFTERNOON,TAKE 2 TABLETS AT  BEDTIME.   ibuprofen 200 MG tablet Commonly known as:  ADVIL,MOTRIN Take 200 mg by mouth every 6 (six) hours as needed.   Krill Oil 300 MG Caps Take 300 mg by mouth.   Lantus 100 UNIT/ML injection Generic drug:  insulin glargine Inject 28 Units into the skin at bedtime.   levothyroxine 112 MCG tablet Commonly known as:  Synthroid Take 1 tablet (112 mcg total) by mouth daily before breakfast.   losartan 25 MG tablet Commonly known as:  COZAAR Take 1 tablet (25 mg total) by mouth 2 (two) times daily.   meclizine 12.5 MG tablet Commonly known as:  ANTIVERT TAKE 2 TABLETS AS NEEDED THREE TIMES A DAY ORALLY 7 DAYS   multivitamin with minerals tablet Take 1 tablet by  mouth daily.   Tart Cherry Advanced Caps Take 1 capsule by mouth daily.   Victoza 18 MG/3ML Sopn Generic drug:  liraglutide Inject 1.8 mg into the skin daily.        OBJECTIVE:   Vital Signs: Pulse 82   Resp (!) 97   Ht 5\' 1"  (1.549 m)   Wt 266 lb 9.6 oz (120.9 kg)   BMI 50.37 kg/m   Wt Readings from Last 3 Encounters:  05/15/18 266 lb 9.6 oz (120.9 kg)  04/03/18 265 lb 6.4 oz (120.4 kg)  03/20/18 263 lb 12.8 oz (119.7 kg)     Exam: General: Pt appears well and is in NAD  Lungs: Clear with good BS bilat with no rales, rhonchi, or wheezes  Heart: RRR with normal S1 and S2 and no gallops; no murmurs; no rub  Extremities: No pretibial edema. No tremor.   Skin: Normal texture and temperature to palpation. No rash noted. No Acanthosis nigricans/skin tags. No lipohypertrophy.  Neuro: MS is good with appropriate affect, pt is alert and Ox3    DM foot exam:04/03/2018 The skin of the feet is intact without sores or ulcerations. The pedal pulses are 2+ on right and 2+ on left. The sensation is absent to a screening 5.07, 10 gram monofilament bilaterally     DATA REVIEWED:  Lab Results  Component Value Date   HGBA1C 6.6 (H) 02/21/2015   Lab Results  Component Value Date   CREATININE 0.91  03/18/2018       03/03/2018 A1c 8.5 %  04/26/2018  TSH 0.630 uIU/mL    ASSESSMENT / PLAN / RECOMMENDATIONS:   1) Type 2 Diabetes Mellitus, Poorly controlled, With neuropathic complications - Most recent A1c of 7.1 %. Goal A1c < 7.0 %.    - Praised patient on lifestyle changes, medication compliance and glucose checks - Her A1c has trended down from 8.5 % to 7.1 % - Encouraged to continue with lifestyle changes - At this time no add-on therapy is needed, but could consider farxiga at a small dose in the future if needed.     MEDICATIONS:  Continue Lantus 28 units daily  Continue Victoza at 1.8 mg daily  EDUCATION / INSTRUCTIONS:  BG monitoring instructions: Patient is instructed to check her blood sugars 2 times a day, fasting and bedtime.  Call [facility] if: BG persistently < 70 or > 300. . I reviewed the Rule of 15 for the treatment of hypoglycemia in detail with the patient. Literature supplied.  2)Hypothyroidism :  -Patient is clinically and biochemically euthyroid -She denies any local neck symptoms -Pt educated extensively on the correct way to take levothyroxine (first thing in the morning with water, 30 minutes before eating or taking other medications). - Pt encouraged to double dose the following day if she were to miss a dose given long half-life of levothyroxine.  Medications Continue  Levothyroxine 112 mcg daily   F/U in 3 months    Signed electronically by: Mack Guise, MD  [facility]  [address] [place], [place] [address] Phone: [phone] FAX: [phone]   CC: Dianna Rossetti, NP (Inactive) [address] [place] [address] Phone: [phone]  Fax: [phone]  Return to Endocrinology clinic as below: Future Appointments  Date Time Provider [place]  11/07/2018 11:00 AM [facility] 5 [facility] None  11/07/2018 11:30 AM  Magrinat, Virgie Dad, MD [facility] None

## 2018-05-15 NOTE — Patient Instructions (Signed)
-   Continue Lantus to 28 units daily  - Continue Victoza 1.8 mg daily  - Continue chacking sugar twice a day (fasting and bedtime )

## 2018-05-30 DIAGNOSIS — R110 Nausea: Secondary | ICD-10-CM | POA: Diagnosis not present

## 2018-05-30 DIAGNOSIS — R42 Dizziness and giddiness: Secondary | ICD-10-CM | POA: Diagnosis not present

## 2018-05-30 DIAGNOSIS — E1143 Type 2 diabetes mellitus with diabetic autonomic (poly)neuropathy: Secondary | ICD-10-CM | POA: Diagnosis not present

## 2018-06-13 DIAGNOSIS — E1143 Type 2 diabetes mellitus with diabetic autonomic (poly)neuropathy: Secondary | ICD-10-CM | POA: Diagnosis not present

## 2018-06-13 DIAGNOSIS — R42 Dizziness and giddiness: Secondary | ICD-10-CM | POA: Diagnosis not present

## 2018-06-13 DIAGNOSIS — F419 Anxiety disorder, unspecified: Secondary | ICD-10-CM | POA: Diagnosis not present

## 2018-06-13 DIAGNOSIS — M5090 Cervical disc disorder, unspecified, unspecified cervical region: Secondary | ICD-10-CM | POA: Diagnosis not present

## 2018-06-21 ENCOUNTER — Ambulatory Visit: Payer: Medicare Other | Admitting: Orthopedic Surgery

## 2018-06-21 ENCOUNTER — Encounter: Payer: Self-pay | Admitting: Orthopedic Surgery

## 2018-06-21 ENCOUNTER — Ambulatory Visit (INDEPENDENT_AMBULATORY_CARE_PROVIDER_SITE_OTHER): Payer: Medicare Other | Admitting: Orthopedic Surgery

## 2018-06-21 ENCOUNTER — Other Ambulatory Visit: Payer: Self-pay

## 2018-06-21 DIAGNOSIS — M19011 Primary osteoarthritis, right shoulder: Secondary | ICD-10-CM | POA: Diagnosis not present

## 2018-06-21 NOTE — Addendum Note (Signed)
Addended byLaurann Montana on: 06/21/2018 04:41 PM   Modules accepted: Orders
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
## 2018-06-28 DIAGNOSIS — R51 Headache: Secondary | ICD-10-CM | POA: Diagnosis not present

## 2018-06-28 DIAGNOSIS — R42 Dizziness and giddiness: Secondary | ICD-10-CM | POA: Diagnosis not present

## 2018-06-28 DIAGNOSIS — M25511 Pain in right shoulder: Secondary | ICD-10-CM | POA: Diagnosis not present

## 2018-06-28 DIAGNOSIS — E1143 Type 2 diabetes mellitus with diabetic autonomic (poly)neuropathy: Secondary | ICD-10-CM | POA: Diagnosis not present

## 2018-07-01 ENCOUNTER — Other Ambulatory Visit: Payer: Medicare Other

## 2018-07-01 ENCOUNTER — Telehealth: Payer: Self-pay

## 2018-07-01 DIAGNOSIS — Z20822 Contact with and (suspected) exposure to covid-19: Secondary | ICD-10-CM

## 2018-07-01 NOTE — Telephone Encounter (Signed)
Pt. Called; advised with recent visit to Orthopedic MD, [place] have been exposed to COVID 19; offered appt. For COVID screening.  Appt. Offered at 10:00 AM today, at the [facility] test site. Pt. Agreed.

## 2018-07-03 ENCOUNTER — Telehealth: Payer: Self-pay | Admitting: Neurology

## 2018-07-03 LAB — NOVEL CORONAVIRUS, NAA: SARS-CoV-2, NAA: NOT DETECTED

## 2018-07-03 NOTE — Telephone Encounter (Signed)
I called patient regarding her 6/3 appointment. Patient states that she is experiencing unbearable vertigo. Patient has a flag in her chart for COVID-19 and states she was tested 5/30 because she had an appointment at her doctor's office and was exposed to someone who had tested positive for the virus. I offered a virtual visit but patient states that she does not have access to internet. I advised patient that we could do an in office visit once we find out her results. I rescheduled patient for 7/15 for now and advised her to call back as soon as she knows her results from testing. I provided patient with my direct number and office hours. She verbalized understanding. I will follow-up with patient at the end of this week if I do not hear back.

## 2018-07-04 ENCOUNTER — Telehealth: Payer: Self-pay | Admitting: Neurology

## 2018-07-04 NOTE — Telephone Encounter (Signed)
Due to current COVID 19 pandemic, our office is severely reducing in office visits until further notice, in order to minimize the risk to our patients and healthcare providers.   Spoke with patient to schedule an in office visit for her due to extreme vertigo. She was just tested for coronavirus and received a negative result. Patient accepted an appointment on 6/3 and understands the precautions we are taking for in office visits. Patient agrees to wear a mask and is aware that she will have her temperature checked upon arrival.

## 2018-07-05 ENCOUNTER — Ambulatory Visit (INDEPENDENT_AMBULATORY_CARE_PROVIDER_SITE_OTHER): Payer: Medicare Other | Admitting: Neurology

## 2018-07-05 ENCOUNTER — Other Ambulatory Visit: Payer: Self-pay

## 2018-07-05 ENCOUNTER — Institutional Professional Consult (permissible substitution): Payer: Medicare Other | Admitting: Neurology

## 2018-07-05 ENCOUNTER — Encounter: Payer: Self-pay | Admitting: Neurology

## 2018-07-05 VITALS — BP 151/85 | HR 89 | Temp 98.7°F | Ht 61.0 in | Wt 269.0 lb

## 2018-07-05 DIAGNOSIS — Z6841 Body Mass Index (BMI) 40.0 and over, adult: Secondary | ICD-10-CM | POA: Diagnosis not present

## 2018-07-05 DIAGNOSIS — R42 Dizziness and giddiness: Secondary | ICD-10-CM | POA: Diagnosis not present

## 2018-07-05 DIAGNOSIS — R519 Headache, unspecified: Secondary | ICD-10-CM

## 2018-07-05 DIAGNOSIS — G43109 Migraine with aura, not intractable, without status migrainosus: Secondary | ICD-10-CM | POA: Diagnosis not present

## 2018-07-05 DIAGNOSIS — R51 Headache: Secondary | ICD-10-CM | POA: Diagnosis not present

## 2018-07-05 MED ORDER — UBROGEPANT 50 MG PO TABS: 50.0000 mg | ORAL_TABLET | ORAL | 3 refills | Status: DC | PRN

## 2018-07-05 NOTE — Patient Instructions (Signed)
You Quesnel have intermittent migraine and vertigo. You Tucker benefit from migraine medication: Start Ubrelvy, 50 mg strength: Take 1 pill at onset of migraine headache, [place] repeat in 2 hours You cannot take more than 4 pills in 24 hours, i.e. not to exceed 200 mg per 24 hours. Mckiver cause sedation and nausea.

## 2018-07-05 NOTE — Telephone Encounter (Signed)
Pt did not show for their appt with Dr. Athar today.  
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
## 2018-07-06 ENCOUNTER — Telehealth: Payer: Self-pay

## 2018-07-06 ENCOUNTER — Telehealth: Payer: Self-pay | Admitting: Neurology

## 2018-07-06 LAB — COMPREHENSIVE METABOLIC PANEL
ALT: 17 IU/L (ref 0–32)
AST: 33 IU/L (ref 0–40)
Albumin/Globulin Ratio: 1.6 (ref 1.2–2.2)
Albumin: 4.6 g/dL (ref 3.8–4.8)
Alkaline Phosphatase: 83 IU/L (ref 39–117)
BUN/Creatinine Ratio: 21 (ref 12–28)
BUN: 19 mg/dL (ref 8–27)
Bilirubin Total: 0.6 mg/dL (ref 0.0–1.2)
CO2: 20 mmol/L (ref 20–29)
Calcium: 10.2 mg/dL (ref 8.7–10.3)
Chloride: 98 mmol/L (ref 96–106)
Creatinine, Ser: 0.92 mg/dL (ref 0.57–1.00)
GFR calc Af Amer: 74 mL/min/{1.73_m2} (ref >59)
GFR calc non Af Amer: 64 mL/min/{1.73_m2} (ref >59)
Globulin, Total: 2.9 g/dL (ref 1.5–4.5)
Glucose: 117 mg/dL — ABNORMAL HIGH (ref 65–99)
Potassium: 3.9 mmol/L (ref 3.5–5.2)
Sodium: 138 mmol/L (ref 134–144)
Total Protein: 7.5 g/dL (ref 6.0–8.5)

## 2018-07-06 NOTE — Progress Notes (Signed)
Kidney function and overall electrolytes, liver function good, please update pt. [name] [name]

## 2018-07-06 NOTE — Telephone Encounter (Signed)
Medicare/BCBS Auth: [id number] (exp. 07/06/18 to 01/01/19) order sent to GI. They will reach out to the patient to schedule.

## 2018-07-06 NOTE — Telephone Encounter (Signed)
-----   Message from Star Age, MD sent at 07/06/2018 10:38 AM EDT ----- Kidney function and overall electrolytes, liver function good, please update pt. [name] [name]

## 2018-07-06 NOTE — Telephone Encounter (Signed)
I called pt, discussed her lab results with her. Pt verbalized understanding of results. Pt had no questions at this time but was encouraged to call back if questions arise.

## 2018-07-10 ENCOUNTER — Other Ambulatory Visit: Payer: Self-pay | Admitting: Gastroenterology

## 2018-07-10 DIAGNOSIS — K7460 Unspecified cirrhosis of liver: Secondary | ICD-10-CM

## 2018-07-14 DIAGNOSIS — H524 Presbyopia: Secondary | ICD-10-CM | POA: Diagnosis not present

## 2018-07-14 DIAGNOSIS — E113292 Type 2 diabetes mellitus with mild nonproliferative diabetic retinopathy without macular edema, left eye: Secondary | ICD-10-CM | POA: Diagnosis not present

## 2018-07-14 DIAGNOSIS — H43813 Vitreous degeneration, bilateral: Secondary | ICD-10-CM | POA: Diagnosis not present

## 2018-07-14 DIAGNOSIS — H35373 Puckering of macula, bilateral: Secondary | ICD-10-CM | POA: Diagnosis not present

## 2018-07-24 ENCOUNTER — Other Ambulatory Visit: Payer: Self-pay

## 2018-07-24 ENCOUNTER — Ambulatory Visit
Admission: RE | Admit: 2018-07-24 | Discharge: 2018-07-24 | Disposition: A | Discharge status: Home / Self Care | Payer: Medicare Other | Source: Ambulatory Visit | Attending: Orthopedic Surgery | Admitting: Orthopedic Surgery

## 2018-07-24 DIAGNOSIS — M19011 Primary osteoarthritis, right shoulder: Secondary | ICD-10-CM | POA: Diagnosis not present

## 2018-07-25 ENCOUNTER — Ambulatory Visit
Admission: RE | Admit: 2018-07-25 | Discharge: 2018-07-25 | Disposition: A | Discharge status: Home / Self Care | Payer: Medicare Other | Source: Ambulatory Visit | Attending: Neurology | Admitting: Neurology

## 2018-07-25 DIAGNOSIS — R42 Dizziness and giddiness: Secondary | ICD-10-CM

## 2018-07-25 DIAGNOSIS — G43109 Migraine with aura, not intractable, without status migrainosus: Secondary | ICD-10-CM

## 2018-07-25 DIAGNOSIS — R51 Headache: Secondary | ICD-10-CM

## 2018-07-25 DIAGNOSIS — R519 Headache, unspecified: Secondary | ICD-10-CM

## 2018-07-25 MED ORDER — GADOBENATE DIMEGLUMINE 529 MG/ML IV SOLN
20.0000 mL | Freq: Once | INTRAVENOUS | Status: AC | PRN
  Administered 2018-07-25: 20 mL via INTRAVENOUS

## 2018-07-31 ENCOUNTER — Other Ambulatory Visit: Payer: Self-pay

## 2018-07-31 ENCOUNTER — Telehealth: Payer: Self-pay | Admitting: Neurology

## 2018-07-31 ENCOUNTER — Ambulatory Visit (INDEPENDENT_AMBULATORY_CARE_PROVIDER_SITE_OTHER): Payer: Medicare Other | Admitting: Orthopedic Surgery

## 2018-07-31 ENCOUNTER — Encounter: Payer: Self-pay | Admitting: Orthopedic Surgery

## 2018-07-31 DIAGNOSIS — M19011 Primary osteoarthritis, right shoulder: Secondary | ICD-10-CM

## 2018-07-31 NOTE — Telephone Encounter (Signed)
Brain MRI w/wo contrast showed stable chronic findings, overall not much change compared to 2014, which is very reassuring and normal contrast uptake. Please update pt. She can FU with MM as scheduled.

## 2018-07-31 NOTE — Telephone Encounter (Signed)
I called pt to discuss her MRI results. No answer, left a message asking her to call me back. 

## 2018-08-01 DIAGNOSIS — H9193 Unspecified hearing loss, bilateral: Secondary | ICD-10-CM | POA: Diagnosis not present

## 2018-08-01 DIAGNOSIS — R42 Dizziness and giddiness: Secondary | ICD-10-CM | POA: Diagnosis not present

## 2018-08-01 DIAGNOSIS — R519 Headache, unspecified: Secondary | ICD-10-CM | POA: Insufficient documentation

## 2018-08-01 DIAGNOSIS — R51 Headache: Secondary | ICD-10-CM | POA: Diagnosis not present

## 2018-08-01 NOTE — Telephone Encounter (Signed)
I called pt, discussed her MRI results and recommendations. Pt verbalized understanding of results. Pt had no questions at this time but was encouraged to call back if questions arise.

## 2018-08-02 ENCOUNTER — Encounter: Payer: Self-pay | Admitting: Orthopedic Surgery

## 2018-08-02 NOTE — Progress Notes (Signed)
Office Visit Note   Patient: [name] [name]           Date of Birth: [birth year]           MRN: [id number] Visit Date: 07/31/2018 Requested by: No referring provider defined for this encounter. PCP: Dianna Rossetti, NP (Inactive)  Subjective: Chief Complaint  Patient presents with  . Follow-up    HPI: [name] [name] is a patient with right shoulder pain.  Since have seen her she is had a CT scan.  She reports debilitating and chronic pain in the shoulder.  CT scan is reviewed and it does show severe glenohumeral arthritis with significant osteophyte off the inferior humeral head.  There is a loose body present as well.  Rotator cuff tendons are thickened consistent with tendinopathy.  Impression is right shoulder arthritis.  Plan is right reverse shoulder replacement.  The risk and benefits are discussed including not limited to infection nerve vessel damage dislocation some loss of function and some loss of strength.  Patient understands the risk and benefits and wishes to proceed.              ROS: All systems reviewed are negative as they relate to the chief complaint within the history of present illness.  Patient denies  fevers or chills. The lungs  Assessment & Plan: Visit Diagnoses:  1. Primary osteoarthritis of right shoulder     Plan: Impression is right shoulder arthritis.  Discussed with [name] [name] at length the risks and benefits of surgery which are detailed in the HPI.  She wants to go ahead and proceed.  CT scan has been done for patient specific instrumentation to be used.  Anticipate 23-hour observation in the hospital with CPM machine postoperatively.  All questions answered.  Follow-Up Instructions: No follow-ups on file.   Orders:  No orders of the defined types were placed in this encounter.  No orders of the defined types were placed in this encounter.     Procedures: No procedures performed   Clinical Data: No additional findings.  Objective: Vital Signs: There were  no vitals taken for this visit.  Physical Exam:   Constitutional: Patient appears well-developed HEENT:  Head: Normocephalic Eyes:EOM are normal Neck: Normal range of motion Cardiovascular: Normal rate Pulmonary/chest: Effort normal Neurologic: Patient is alert Skin: Skin is warm Psychiatric: Patient has normal mood and affect    Ortho Exam: Ortho exam demonstrates full active and passive range of motion of the cervical spine.  The right shoulder has limited forward flexion abduction to just less than 90.  She does have reasonable strength infraspinatus supraspinatus and subscap testing.  No real significant tenderness to palpation of the [facility] joint on the right-hand side.  She does not use that right arm for weightbearing.  Specialty Comments:  No specialty comments available.  Imaging: No results found.   PMFS History: Patient Active Problem List   Diagnosis Date Noted  . Type 2 diabetes mellitus with diabetic polyneuropathy, with long-term current use of insulin ([place]) 11/16/2016  . Anxiety and depression 09/01/2016  . Bilateral lower extremity edema 09/01/2016  . Hypothyroidism 09/01/2016  . Diabetic polyneuropathy associated with type 2 diabetes mellitus ([place]) 06/03/2016  . Achilles tendon contracture, bilateral 06/03/2016  . Malignant neoplasm of upper-outer quadrant of left breast in female, estrogen receptor positive ([place]) 08/19/2015  . Discharge from left nipple 08/03/2015  . Abnormal mammogram of left breast 08/03/2015  . Osteoarthritis of right knee 02/28/2015  . Status post total right knee  replacement 02/28/2015  . Low TSH level 08/02/2014  . Splenomegaly 07/30/2014  . Thrombocytopenia ([place]) 07/30/2014  . 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 Recurrent falls 01/12/2013  . Neuropathy, diabetic ([place]) 01/12/2013  . Morbid obesity ([place]) 01/12/2013  . Degenerative arthritis of spine 01/12/2013  . Knee pain 01/12/2013  . Cough 09/05/2012  . HBP (high blood  pressure) 09/05/2012  . Chest wall contusion 12/09/2011  . Abdominal wall contusion 12/09/2011   Past Medical History:  Diagnosis Date  . Anxiety   . Arthritis   . Blood dyscrasia    low platelet count- sees [name] , [name] ( Note in [place] from 07/2014) at [facility]  . Breast cancer [facility]) 2017   Left Breast  . Cancer ([place])    left breast  . Cough   . Diabetes mellitus without complication ([place])   . Diabetic neuropathy ([place])   . Gout   . Gout   . History of radiation therapy 10/22/15 - 12/08/15   left breast 50.4 Gy, boost to 10 Gy  . Hypertension   . Migraine   . 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 Neuromuscular disorder ([place])    diabetic neuropathy  . Neuropathy   . Personal history of radiation therapy 2017   Left Breast Cancer  . Sleep apnea    CPAP nightly  . Thyroid disease     Family History  Problem Relation Age of Onset  . Hypothyroidism Mother   . Bone cancer Paternal Grandmother   . Heart failure Paternal Grandmother   . Cancer Maternal Grandmother   . Heart failure Maternal Grandfather   . Cancer Sister        GYN cancer  . Hypothyroidism Sister     Past Surgical History:  Procedure Laterality Date  . BREAST BIOPSY Left 08/04/2015   Procedure: LEFT BREAST BIOPSY WITH NEEDLE LOCALIZATION;  Surgeon: [name], MD;  Location: [place];  Service: General;  Laterality: Left;  . BREAST DUCTAL SYSTEM EXCISION Left 08/04/2015   Procedure: LEFT EXCISION DUCTAL SYSTEM BREAST;  Surgeon: [name], MD;  Location: [place];  Service: General;  Laterality: Left;  . BREAST LUMPECTOMY Left 08/2015  . BREAST LUMPECTOMY WITH AXILLARY LYMPH NODE BIOPSY Left 09/12/2015   Procedure: RE-EXCISION OF LEFT BREAST LUMPECTOMY WITH LEFT AXILLARY LYMPH NODE BIOPSY;  Surgeon: [name], MD;  Location: [place];  Service: General;  Laterality: Left;  . CHOLECYSTECTOMY    . JOINT REPLACEMENT     left  knee  . TOTAL KNEE ARTHROPLASTY Right 02/28/2015   Procedure: RIGHT TOTAL KNEE ARTHROPLASTY;  Surgeon: [name], MD;  [facility];  Service: Orthopedics;  Laterality: Right;  . TUBAL LIGATION     Social History   Occupational History  . Occupation: Retired    Comment: [employer]  Tobacco Use  . Smoking status: Never Smoker  . Smokeless tobacco: Never Used  Substance and Sexual Activity  . Alcohol use: No  . Drug use: No  . Sexual activity: Not on file

## 2018-08-03 ENCOUNTER — Ambulatory Visit
Admission: RE | Admit: 2018-08-03 | Discharge: 2018-08-03 | Disposition: A | Discharge status: Home / Self Care | Payer: Medicare Other | Source: Ambulatory Visit | Attending: Gastroenterology | Admitting: Gastroenterology

## 2018-08-03 DIAGNOSIS — R932 Abnormal findings on diagnostic imaging of liver and biliary tract: Secondary | ICD-10-CM | POA: Diagnosis not present

## 2018-08-03 DIAGNOSIS — K7460 Unspecified cirrhosis of liver: Secondary | ICD-10-CM

## 2018-08-03 DIAGNOSIS — R161 Splenomegaly, not elsewhere classified: Secondary | ICD-10-CM | POA: Diagnosis not present

## 2018-08-09 DIAGNOSIS — H8111 Benign paroxysmal vertigo, right ear: Secondary | ICD-10-CM | POA: Diagnosis not present

## 2018-08-09 DIAGNOSIS — H903 Sensorineural hearing loss, bilateral: Secondary | ICD-10-CM | POA: Diagnosis not present

## 2018-08-14 ENCOUNTER — Ambulatory Visit: Payer: Medicare Other | Admitting: Internal Medicine

## 2018-08-16 ENCOUNTER — Institutional Professional Consult (permissible substitution): Payer: Medicare Other | Admitting: Neurology

## 2018-08-21 ENCOUNTER — Other Ambulatory Visit: Payer: Self-pay

## 2018-08-21 ENCOUNTER — Encounter: Payer: Self-pay | Admitting: Internal Medicine

## 2018-08-21 ENCOUNTER — Ambulatory Visit (INDEPENDENT_AMBULATORY_CARE_PROVIDER_SITE_OTHER): Payer: Medicare Other | Admitting: Internal Medicine

## 2018-08-21 VITALS — BP 132/74 | HR 96 | Temp 97.7°F | Ht 61.0 in | Wt 276.8 lb

## 2018-08-21 DIAGNOSIS — Z794 Long term (current) use of insulin: Secondary | ICD-10-CM | POA: Diagnosis not present

## 2018-08-21 DIAGNOSIS — E1165 Type 2 diabetes mellitus with hyperglycemia: Secondary | ICD-10-CM

## 2018-08-21 DIAGNOSIS — E039 Hypothyroidism, unspecified: Secondary | ICD-10-CM | POA: Diagnosis not present

## 2018-08-21 DIAGNOSIS — E1142 Type 2 diabetes mellitus with diabetic polyneuropathy: Secondary | ICD-10-CM | POA: Diagnosis not present

## 2018-08-21 LAB — POCT GLYCOSYLATED HEMOGLOBIN (HGB A1C): Hemoglobin A1C: 7.8 % — AB (ref 4.0–5.6)

## 2018-08-21 LAB — T4, FREE: Free T4: 0.78 ng/dL (ref 0.60–1.60)

## 2018-08-21 LAB — TSH: TSH: 5.87 u[IU]/mL — ABNORMAL HIGH (ref 0.35–4.50)

## 2018-08-21 MED ORDER — FARXIGA 5 MG PO TABS: 5.0000 mg | ORAL_TABLET | Freq: Every day | ORAL | 2 refills | Status: DC

## 2018-08-21 NOTE — Patient Instructions (Addendum)
-   Continue Lantus at 28 units daily  - Continue Victoza 1.8 mg daily  - Start Farxiga 5 mg daily     HOW TO TREAT LOW BLOOD SUGARS (Blood sugar LESS THAN 70 MG/DL)  Please follow the RULE OF 15 for the treatment of hypoglycemia treatment (when your (blood sugars are less than 70 mg/dL)    STEP 1: Take 15 grams of carbohydrates when your blood sugar is low, which includes:   3-4 GLUCOSE TABS  OR  3-4 OZ OF JUICE OR REGULAR SODA OR  ONE TUBE OF GLUCOSE GEL     STEP 2: RECHECK blood sugar in 15 MINUTES STEP 3: If your blood sugar is still low at the 15 minute recheck --> then, go back to STEP 1 and treat AGAIN with another 15 grams of carbohydrates.

## 2018-08-21 NOTE — Progress Notes (Signed)
Name: [name] [name]  Age/ Sex: 68 y.o., female   MRN/ DOB: [id number], February 07, 1950     PCP: Leeroy Cha, MD   Reason for Endocrinology Evaluation: Type 2 Diabetes Mellitus  Initial Endocrine Consultative Visit: 03/20/2018    PATIENT IDENTIFIER: [name] [name] is a 67 y.o. female with a past medical history of T2DM, hypothyroidism, HTN and Hx of breast Ca . The patient has followed with Endocrinology clinic since 03/20/2018 for consultative assistance with management of her diabetes.  DIABETIC HISTORY:  Ms. Harrell was diagnosed with T2DM many years ago, she is intolerant to Metformin. She was started on Jardiance January, 2020 but due to dizziness this was stopped in February, 2020.She was continued on Victoza and Lantus at the time.  Her hemoglobin A1c has ranged from 6.6% in 2017, peaking at 8.5%  In 2020    Hypothyroidism: She was diagnosed with hypothyroidism many years ago. She has been on LT-4 replacement , last TSH was within normal range 0.41 uIU/mL. She is on 135 mcg daily   SUBJECTIVE:   During the last visit (04/03/2018): We increased Lantus to 28 units and continued victoza at 1.8   Today (08/21/2018): [name] [name] is here for a 3 months  follow up on diabetes management.  She checks her blood sugars 2 times daily, preprandial to breakfast and bedtime. The patient has not had hypoglycemic episodes since the last clinic visit. The patient admits to snacking at times and reduced ability to exercise due to joint aches and pain. Otherwise, the patient has not required any recent emergency interventions for hypoglycemia and has not had recent hospitalizations secondary to hyper or hypoglycemic episodes.  Has been diagnosed with Benign positional vertigo.     ROS: As per HPI and as detailed below: Review of Systems  Constitutional: Negative for fever and weight loss.  Eyes: Negative for pain.   Respiratory: Negative for cough and shortness of breath.   Cardiovascular: Negative for chest pain and palpitations.  Gastrointestinal: Negative for diarrhea and nausea.  Genitourinary: Negative for frequency.  Skin: Negative.   Neurological: Negative for dizziness.  Endo/Heme/Allergies: Negative for polydipsia.      HOME DIABETES REGIMEN:   Continue Victoza 1.8 mg daily  Continue Lantus 28 units daily    GLUCOSE LOG:  BG range >150 mg/dL      HISTORY:  Past Medical History:  Past Medical History:  Diagnosis Date  . Anxiety   . Arthritis   . Blood dyscrasia    low platelet count- sees [name] , [name] ( Note in [place] from 07/2014) at [facility]  . Breast cancer [facility]) 2017   Left Breast  . Cancer ([place])    left breast  . Cough   . Diabetes mellitus without complication ([place])   . Diabetic neuropathy ([place])   . Gout   . Gout   . History of radiation therapy 10/22/15 - 12/08/15   left breast 50.4 Gy, boost to 10 Gy  . Hypertension   . Migraine   . 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 Neuromuscular disorder ([place])    diabetic neuropathy  . Neuropathy   . Personal history of radiation therapy 2017   Left Breast Cancer  . Sleep apnea    CPAP nightly  . Thyroid disease    Past Surgical History:  Past Surgical History:  Procedure Laterality Date  . BREAST BIOPSY Left 08/04/2015   Procedure: LEFT BREAST BIOPSY WITH NEEDLE LOCALIZATION;  Surgeon: [name],  MD;  Location: [place];  Service: General;  Laterality: Left;  . BREAST DUCTAL SYSTEM EXCISION Left 08/04/2015   Procedure: LEFT EXCISION DUCTAL SYSTEM BREAST;  Surgeon: [name], MD;  Location: [place];  Service: General;  Laterality: Left;  . BREAST LUMPECTOMY Left 08/2015  . BREAST LUMPECTOMY WITH AXILLARY LYMPH NODE BIOPSY Left 09/12/2015   Procedure: RE-EXCISION OF LEFT BREAST LUMPECTOMY WITH LEFT AXILLARY LYMPH NODE BIOPSY;  Surgeon: [name], MD;  Location: [place];  Service: General;  Laterality: Left;  . CHOLECYSTECTOMY    . JOINT REPLACEMENT     left knee  . TOTAL KNEE ARTHROPLASTY Right 02/28/2015   Procedure: RIGHT TOTAL KNEE ARTHROPLASTY;  Surgeon: [name], MD;  [facility];  Service: Orthopedics;  Laterality: Right;  . TUBAL LIGATION      Social History:  reports that she has never smoked. She has never used smokeless tobacco. She reports that she does not drink alcohol or use drugs. Family History:  Family History  Problem Relation Age of Onset  . Hypothyroidism Mother   . Bone cancer Paternal Grandmother   . Heart failure Paternal Grandmother   . Cancer Maternal Grandmother   . Heart failure Maternal Grandfather   . Cancer Sister        GYN cancer  . Hypothyroidism Sister      HOME MEDICATIONS: Allergies as of 08/21/2018      Reactions   Influenza A (h1n1) [place] Pf Anaphylaxis   Codeine Swelling   Jardiance [empagliflozin] Other (See Comments)   dehydration      Medication List       Accurate as of August 21, 2018  1:56 PM. If you have any questions, ask your nurse or doctor.        acetaminophen 325 MG tablet Commonly known as: TYLENOL Take 650 mg by mouth every 6 (six) hours as needed.   acetaminophen-codeine 300-30 MG tablet Commonly known as: TYLENOL #3 Take 1 tablet by mouth every 6 (six) hours as needed for moderate pain.   anastrozole 1 MG tablet Commonly known as: ARIMIDEX TAKE 1 TABLET BY MOUTH EVERY DAY   Farxiga 5 MG Tabs tablet Generic drug: dapagliflozin propanediol Take 5 mg by mouth daily. Started by: Dorita Sciara, MD   fluticasone 50 MCG/ACT nasal spray Commonly known as: FLONASE USE 1 SPRAY IN EACH NOSTRIL TWICE A DAY FOR 7 DAYS   furosemide 20 MG tablet Commonly known as: LASIX TAKE 1 TABLET BY MOUTH EVERY DAY AS NEEDED. NEEDS OFFICE VISIT FOR FURTHER REFILLS What changed: See the new instructions.   gabapentin  600 MG tablet Commonly known as: NEURONTIN TAKE 1 TABLET IN THE AM. TAKE 1 TABLET IN THE AFTERNOON,TAKE 2 TABLETS AT BEDTIME. What changed: See the new instructions.   ibuprofen 200 MG tablet Commonly known as: ADVIL Take 200 mg by mouth every 6 (six) hours as needed.   Krill Oil 300 MG Caps Take 300 mg by mouth.   Lantus 100 UNIT/ML injection Generic drug: insulin glargine Inject 28 Units into the skin at bedtime.   levothyroxine 112 MCG tablet Commonly known as: Synthroid Take 1 tablet (112 mcg total) by mouth daily before breakfast.   losartan 25 MG tablet Commonly known as: COZAAR Take 1 tablet (25 mg total) by mouth 2 (two) times daily.   meclizine 12.5 MG tablet Commonly known as: ANTIVERT TAKE 2 TABLETS AS NEEDED THREE TIMES A DAY ORALLY 7 DAYS  multivitamin with minerals tablet Take 1 tablet by mouth daily.   Tart Cherry Advanced Caps Take 1 capsule by mouth daily.   Ubrogepant 50 MG Tabs Commonly known as: Ubrelvy Take 50 mg by mouth as needed ([place] repeat in 2 hours, for acute migraine headache.).   Victoza 18 MG/3ML Sopn Generic drug: liraglutide Inject 1.8 mg into the skin daily.        OBJECTIVE:   Vital Signs: BP 132/74 (BP Location: Left Arm, Patient Position: Sitting, Cuff Size: Large)   Pulse 96   Temp 97.7 F (36.5 C)   Ht 5\' 1"  (1.549 m)   Wt 276 lb 12.8 oz (125.6 kg)   SpO2 97%   BMI 52.30 kg/m   Wt Readings from Last 3 Encounters:  08/21/18 276 lb 12.8 oz (125.6 kg)  07/05/18 269 lb (122 kg)  05/15/18 266 lb 9.6 oz (120.9 kg)     Exam: General: Pt appears well and is in NAD  Lungs: Clear with good BS bilat with no rales, rhonchi, or wheezes  Heart: RRR with normal S1 and S2 and no gallops; no murmurs; no rub  Extremities: No pretibial edema. No tremor.   Skin: Normal texture and temperature to palpation. No rash noted. No Acanthosis nigricans/skin tags. No lipohypertrophy.  Neuro: MS is good with appropriate affect, pt is  alert and Ox3    DM foot exam:04/03/2018 The skin of the feet is intact without sores or ulcerations. The pedal pulses are 2+ on right and 2+ on left. The sensation is absent to a screening 5.07, 10 gram monofilament bilaterally     DATA REVIEWED:  Lab Results  Component Value Date   HGBA1C 7.8 (A) 08/21/2018   HGBA1C 7.1 (A) 05/15/2018   HGBA1C 6.6 (H) 02/21/2015   Lab Results  Component Value Date   CREATININE 0.92 07/05/2018       03/03/2018 A1c 8.5 %  04/26/2018  TSH 0.630 uIU/mL    ASSESSMENT / PLAN / RECOMMENDATIONS:   1) Type 2 Diabetes Mellitus, Poorly controlled, With neuropathic complications - Most recent A1c of 7.8 %. Goal A1c < 7.0 %.    - Unfortunately her glucose control has trended up, this is due to lack of activity , diatary indiscretions and weight gain  - Encouraged to work on  lifestyle changes, we discussed the limited options for  - She was on Jardiance at some point but due to dizziness at the time we stopped it, she has been off of it for many months but her dizziness has persisted , pt with a new diagnosis of benign positional vertigo  - Will star he ron Farxiga , discussed increased risk of genital hydration.     MEDICATIONS:  Continue Lantus 28 units daily  Continue Victoza at 1.8 mg daily  Start Farxiga at 5 mg daily   EDUCATION / INSTRUCTIONS:  BG monitoring instructions: Patient is instructed to check her blood sugars 2 times a day, fasting and bedtime.  Call [facility] if: BG persistently < 70 or > 300. . I reviewed the Rule of 15 for the treatment of hypoglycemia in detail with the patient. Literature supplied.  2)Hypothyroidism :  -Patient is clinically euthyroid -She denies any local neck symptoms -Pt educated extensively on the correct way to take levothyroxine (first thing in the morning with water, 30 minutes before eating or taking other medications). - Pt encouraged to double dose the following day if  she were to miss a dose given  long half-life of levothyroxine. - TSh is high today, will increase LT-4 replacement therapy as below   Medications Increase   Levothyroxine to 125 mcg daily   F/U in 2 months    Signed electronically by: Mack Guise, MD  [facility] Endocrinology  [facility] [address] [place], [place] [address] Phone: [phone] FAX: [phone]   CC: Leeroy Cha, [place] [place] [address] Phone: [phone]  Fax: [phone]  Return to Endocrinology clinic as below: Future Appointments  Date Time Provider [place]  09/04/2018 10:00 AM [facility] None  09/22/2018 10:00 AM Meredith Pel, MD [facility] None  10/31/2018  3:00 PM Ward Givens, NP [facility] None  11/07/2018 11:00 AM [facility] LAB 5 [facility] None  11/07/2018 11:30 AM Magrinat, Virgie Dad, MD [facility] None

## 2018-08-22 ENCOUNTER — Encounter: Payer: Self-pay | Admitting: Internal Medicine

## 2018-08-22 DIAGNOSIS — E1140 Type 2 diabetes mellitus with diabetic neuropathy, unspecified: Secondary | ICD-10-CM | POA: Diagnosis not present

## 2018-08-22 DIAGNOSIS — G43901 Migraine, unspecified, not intractable, with status migrainosus: Secondary | ICD-10-CM | POA: Diagnosis not present

## 2018-08-22 DIAGNOSIS — E669 Obesity, unspecified: Secondary | ICD-10-CM | POA: Diagnosis not present

## 2018-08-22 DIAGNOSIS — H8111 Benign paroxysmal vertigo, right ear: Secondary | ICD-10-CM | POA: Diagnosis not present

## 2018-08-22 MED ORDER — LEVOTHYROXINE SODIUM 125 MCG PO TABS: 125.0000 ug | ORAL_TABLET | Freq: Every day | ORAL | 3 refills | Status: DC

## 2018-08-29 DIAGNOSIS — G43901 Migraine, unspecified, not intractable, with status migrainosus: Secondary | ICD-10-CM | POA: Diagnosis not present

## 2018-08-29 DIAGNOSIS — E669 Obesity, unspecified: Secondary | ICD-10-CM | POA: Diagnosis not present

## 2018-08-29 DIAGNOSIS — H8111 Benign paroxysmal vertigo, right ear: Secondary | ICD-10-CM | POA: Diagnosis not present

## 2018-08-29 DIAGNOSIS — E1140 Type 2 diabetes mellitus with diabetic neuropathy, unspecified: Secondary | ICD-10-CM | POA: Diagnosis not present

## 2018-08-29 NOTE — Progress Notes (Signed)
[id number] [place], [place] - [address] [place] [address] Phone: [phone] Fax: [phone]  [facility] - [address], [place] [address] Phone: [phone] Fax: [phone]      Your procedure is scheduled on August 6  Report to [facility] Main Entrance "A" at 1015 A.M., and check in at the Admitting office.  Call this number if you have problems the morning of surgery:  [phone]  Call [phone] if you have any questions prior to your surgery date Monday-Friday 8am-4pm    Remember:  Do not eat after midnight the night before your surgery  You [name] [name] drink clear liquids until 1015 am the morning of your surgery.   Clear liquids allowed are: Water, Non-Citrus Juices (without pulp), Carbonated Beverages, Clear Tea, Black Coffee Only, and Gatorade  Enhanced Recovery after Surgery for Orthopedics Enhanced Recovery after Surgery is a protocol used to improve the stress on your body and your recovery after surgery.  Patient Instructions  . The night before surgery:  o No food after midnight. ONLY clear liquids after midnight  . The day of surgery (if you have diabetes): o  o Drink ONE (1) Gatorade 2 (G2) as directed. o This drink was given to you during your hospital  pre-op appointment visit.  o The pre-op nurse will instruct you on the time to drink the   Gatorade 2 (G2) depending on your surgery time. o Color of the Gatorade [place] vary. Red is not allowed. o Nothing else to drink after completing the  Gatorade 2 (G2).         If you have questions, please contact your surgeon's office.     Take these medicines the morning of surgery with A SIP OF WATER acetaminophen (TYLENOL)  If needed anastrozole (ARIMIDEX)  fluticasone (FLONASE) gabapentin (NEURONTIN) levothyroxine (SYNTHROID)    WHAT DO I DO ABOUT MY DIABETES  MEDICATION?   [name] Do not take oral diabetes medicines (pills) the morning of surgery. dapagliflozin propanediol (FARXIGA)  . THE NIGHT BEFORE SURGERY, take ____14_______ units of __insulin glargine (LANTUS) _________insulin.      . The day of surgery, do not take other diabetes injectables, including Byetta (exenatide), Bydureon (exenatide ER), Victoza (liraglutide), or Trulicity (dulaglutide).   How to Manage Your Diabetes Before and After Surgery  Why is it important to control my blood sugar before and after surgery? . Improving blood sugar levels before and after surgery helps healing and can limit problems. . A way of improving blood sugar control is eating a healthy diet by: o  Eating less sugar and carbohydrates o  Increasing activity/exercise o  Talking with your doctor about reaching your blood sugar goals . High blood sugars (greater than 180 mg/dL) can raise your risk of infections and slow your recovery, so you will need to focus on controlling your diabetes during the weeks before surgery. . Make sure that the doctor who takes care of your diabetes knows about your planned surgery including the date and location.  How do I manage my blood sugar before surgery? . Check your blood sugar at least 4 times a day, starting 2 days before surgery, to make sure that the level is not too high or low. o Check your blood sugar the morning of your surgery when you wake up and every 2 hours until you get to  the Short Stay unit. . If your blood sugar is less than 70 mg/dL, you will need to treat for low blood sugar: o Do not take insulin. o Treat a low blood sugar (less than 70 mg/dL) with  cup of clear juice (cranberry or apple), 4 glucose tablets, OR glucose gel. o Recheck blood sugar in 15 minutes after treatment (to make sure it is greater than 70 mg/dL). If your blood sugar is not greater than 70 mg/dL on recheck, call [phone] for further instructions. . Report your blood sugar to  the short stay nurse when you get to Short Stay.  . If you are admitted to the hospital after surgery: o Your blood sugar will be checked by the staff and you will probably be given insulin after surgery (instead of oral diabetes medicines) to make sure you have good blood sugar levels. o The goal for blood sugar control after surgery is 80-180 mg/dL.  7 days prior to surgery STOP taking any Aspirin (unless otherwise instructed by your surgeon), Aleve, Naproxen, Ibuprofen, Motrin, Advil, Goody's, BC's, all herbal medications, fish oil, and all vitamins.    The Morning of Surgery  Do not wear jewelry, make-up or nail polish.  Do not wear lotions, powders, or perfumes/colognes, or deodorant  Do not shave 48 hours prior to surgery.  Men Semel shave face and neck.  Do not bring valuables to the hospital.  [facility] is not responsible for any belongings or valuables.  If you are a smoker, DO NOT Smoke 24 hours prior to surgery IF you wear a CPAP at night please bring your mask, tubing, and machine the morning of surgery   Remember that you must have someone to transport you home after your surgery, and remain with you for 24 hours if you are discharged the same day.   Contacts, glasses, hearing aids, dentures or bridgework [place] not be worn into surgery.    Leave your suitcase in the car.  After surgery it [place] be brought to your room.  For patients admitted to the hospital, discharge time will be determined by your treatment team.  Patients discharged the day of surgery will not be allowed to drive home.    Special instructions:   [place]- Preparing For Surgery  Before surgery, you can play an important role. Because skin is not sterile, your skin needs to be as free of germs as possible. You can reduce the number of germs on your skin by washing with CHG (chlorahexidine gluconate) Soap before surgery.  CHG is an antiseptic cleaner which kills germs and bonds with the skin to continue  killing germs even after washing.    Oral Hygiene is also important to reduce your risk of infection.  Remember - BRUSH YOUR TEETH THE MORNING OF SURGERY WITH YOUR REGULAR TOOTHPASTE  Please do not use if you have an allergy to CHG or antibacterial soaps. If your skin becomes reddened/irritated stop using the CHG.  Do not shave (including legs and underarms) for at least 48 hours prior to first CHG shower. It is OK to shave your face.  Please follow these instructions carefully.   1. Shower the NIGHT BEFORE SURGERY and the MORNING OF SURGERY with CHG Soap.   2. If you chose to wash your hair, wash your hair first as usual with your normal shampoo.  3. After you shampoo, rinse your hair and body thoroughly to remove the shampoo.  4. Use CHG as you would any other liquid  soap. You can apply CHG directly to the skin and wash gently with a scrungie or a clean washcloth.   5. Apply the CHG Soap to your body ONLY FROM THE NECK DOWN.  Do not use on open wounds or open sores. Avoid contact with your eyes, ears, mouth and genitals (private parts). Wash Face and genitals (private parts)  with your normal soap.   6. Wash thoroughly, paying special attention to the area where your surgery will be performed.  7. Thoroughly rinse your body with warm water from the neck down.  8. DO NOT shower/wash with your normal soap after using and rinsing off the CHG Soap.  9. Pat yourself dry with a CLEAN TOWEL.  10. Wear CLEAN PAJAMAS to bed the night before surgery, wear comfortable clothes the morning of surgery  11. Place CLEAN SHEETS on your bed the night of your first shower and DO NOT SLEEP WITH PETS.    Day of Surgery:  Do not apply any deodorants/lotions. Please shower the morning of surgery with the CHG soap  Please wear clean clothes to the hospital/surgery center.   Remember to brush your teeth WITH YOUR REGULAR TOOTHPASTE.   Please read over the following fact sheets that you were given.

## 2018-08-30 ENCOUNTER — Encounter (HOSPITAL_COMMUNITY): Payer: Self-pay

## 2018-08-30 ENCOUNTER — Other Ambulatory Visit: Payer: Self-pay

## 2018-08-30 ENCOUNTER — Encounter (HOSPITAL_COMMUNITY)
Admission: RE | Admit: 2018-08-30 | Discharge: 2018-08-30 | Disposition: A | Discharge status: Home / Self Care | Payer: Medicare Other | Source: Ambulatory Visit | Attending: Orthopedic Surgery | Admitting: Orthopedic Surgery

## 2018-08-30 DIAGNOSIS — E039 Hypothyroidism, unspecified: Secondary | ICD-10-CM | POA: Insufficient documentation

## 2018-08-30 DIAGNOSIS — M19011 Primary osteoarthritis, right shoulder: Secondary | ICD-10-CM | POA: Insufficient documentation

## 2018-08-30 DIAGNOSIS — I10 Essential (primary) hypertension: Secondary | ICD-10-CM | POA: Diagnosis not present

## 2018-08-30 DIAGNOSIS — E1143 Type 2 diabetes mellitus with diabetic autonomic (poly)neuropathy: Secondary | ICD-10-CM | POA: Diagnosis not present

## 2018-08-30 DIAGNOSIS — Z79899 Other long term (current) drug therapy: Secondary | ICD-10-CM | POA: Diagnosis not present

## 2018-08-30 DIAGNOSIS — E1142 Type 2 diabetes mellitus with diabetic polyneuropathy: Secondary | ICD-10-CM | POA: Insufficient documentation

## 2018-08-30 DIAGNOSIS — Z794 Long term (current) use of insulin: Secondary | ICD-10-CM | POA: Insufficient documentation

## 2018-08-30 DIAGNOSIS — G4730 Sleep apnea, unspecified: Secondary | ICD-10-CM | POA: Insufficient documentation

## 2018-08-30 DIAGNOSIS — R42 Dizziness and giddiness: Secondary | ICD-10-CM | POA: Diagnosis not present

## 2018-08-30 DIAGNOSIS — M25511 Pain in right shoulder: Secondary | ICD-10-CM | POA: Diagnosis not present

## 2018-08-30 DIAGNOSIS — Z79890 Hormone replacement therapy (postmenopausal): Secondary | ICD-10-CM | POA: Diagnosis not present

## 2018-08-30 DIAGNOSIS — Z853 Personal history of malignant neoplasm of breast: Secondary | ICD-10-CM | POA: Diagnosis not present

## 2018-08-30 DIAGNOSIS — Z01812 Encounter for preprocedural laboratory examination: Secondary | ICD-10-CM | POA: Insufficient documentation

## 2018-08-30 HISTORY — DX: Unspecified cirrhosis of liver: K74.60

## 2018-08-30 HISTORY — DX: Splenomegaly, not elsewhere classified: R16.1

## 2018-08-30 LAB — CBC
HCT: 41 % (ref 36.0–46.0)
Hemoglobin: 13.2 g/dL (ref 12.0–15.0)
MCH: 29.5 pg (ref 26.0–34.0)
MCHC: 32.2 g/dL (ref 30.0–36.0)
MCV: 91.7 fL (ref 80.0–100.0)
Platelets: 99 10*3/uL — ABNORMAL LOW (ref 150–400)
RBC: 4.47 MIL/uL (ref 3.87–5.11)
RDW: 14.6 % (ref 11.5–15.5)
WBC: 5.9 10*3/uL (ref 4.0–10.5)
nRBC: 0 % (ref 0.0–0.2)

## 2018-08-30 LAB — URINALYSIS, ROUTINE W REFLEX MICROSCOPIC
Bacteria, UA: NONE SEEN
Bilirubin Urine: NEGATIVE
Glucose, UA: >=500 mg/dL — AB
Hgb urine dipstick: NEGATIVE
Ketones, ur: NEGATIVE mg/dL
Leukocytes,Ua: NEGATIVE
Nitrite: NEGATIVE
Protein, ur: NEGATIVE mg/dL
Specific Gravity, Urine: 1.024 (ref 1.005–1.030)
pH: 5 (ref 5.0–8.0)

## 2018-08-30 LAB — SURGICAL PCR SCREEN
MRSA, PCR: NEGATIVE
Staphylococcus aureus: NEGATIVE

## 2018-08-30 LAB — BASIC METABOLIC PANEL
Anion gap: 9 (ref 5–15)
BUN: 16 mg/dL (ref 8–23)
CO2: 24 mmol/L (ref 22–32)
Calcium: 10.3 mg/dL (ref 8.9–10.3)
Chloride: 105 mmol/L (ref 98–111)
Creatinine, Ser: 0.92 mg/dL (ref 0.44–1.00)
GFR calc Af Amer: >60 mL/min (ref >60)
GFR calc non Af Amer: >60 mL/min (ref >60)
Glucose, Bld: 146 mg/dL — ABNORMAL HIGH (ref 70–99)
Potassium: 4 mmol/L (ref 3.5–5.1)
Sodium: 138 mmol/L (ref 135–145)

## 2018-08-30 LAB — GLUCOSE, CAPILLARY: Glucose-Capillary: 194 mg/dL — ABNORMAL HIGH (ref 70–99)

## 2018-08-30 NOTE — Progress Notes (Signed)
PCP: Dr. Fara Olden Cardiologist: Denies Endocrinologist: Dr. Vivia Ewing  EKG: 03/18/2018 CXR: n/a ECHO: denies Stress Test:denies Cardiac Cath: denies Sleep Study: 20 years ago, uses CPAP  Last A1C 7.8 August 21, 2018  Fasting Blood Sugar- 150's Checks Blood Sugar in AM and PM  G2 drink provided and aware to be completed by 9:15am (3 hours prior to surgery time)  PAT instruction note adjusted to reflect correct time.  Patient denies shortness of breath, fever, cough, and chest pain at PAT appointment.  Patient verbalized understanding of instructions provided today at the PAT appointment.  Patient asked to review instructions at home and day of surgery.

## 2018-08-31 LAB — URINE CULTURE: Culture: <10000 — AB

## 2018-08-31 NOTE — Anesthesia Preprocedure Evaluation (Addendum)
Anesthesia Evaluation  Patient identified by MRN, date of birth, ID band Patient awake    Reviewed: Allergy & Precautions, NPO status , Patient's Chart, lab work & pertinent test results  History of Anesthesia Complications Negative for: history of anesthetic complications  Airway Mallampati: II  TM Distance: >3 FB Neck ROM: Full    Dental  (+) Edentulous Lower, Edentulous Upper   Pulmonary sleep apnea and Continuous Positive Airway Pressure Ventilation ,    Pulmonary exam normal        Cardiovascular hypertension, Pt. on medications Normal cardiovascular exam     Neuro/Psych  Headaches, Anxiety Depression    GI/Hepatic negative GI ROS, Neg liver ROS,   Endo/Other  diabetes, Type 2, Insulin Dependent, Oral Hypoglycemic AgentsHypothyroidism Morbid obesity  Renal/GU negative Renal ROS     Musculoskeletal  (+) Arthritis ,   Abdominal   Peds  Hematology Plts 99k   Anesthesia Other Findings Day of surgery medications reviewed with the patient.  Reproductive/Obstetrics                           Anesthesia Physical Anesthesia Plan  ASA: III  Anesthesia Plan: General   Post-op Pain Management: GA combined w/ Regional for post-op pain   Induction: Intravenous  PONV Risk Score and Plan: 3 and Treatment [place] vary due to age or medical condition, Ondansetron, Dexamethasone and Midazolam  Airway Management Planned: Oral ETT  Additional Equipment:   Intra-op Plan:   Post-operative Plan: Extubation in OR  Informed Consent: I have reviewed the patients History and Physical, chart, labs and discussed the procedure including the risks, benefits and alternatives for the proposed anesthesia with the patient or authorized representative who has indicated his/her understanding and acceptance.     Dental advisory given  Plan Discussed with: CRNA  Anesthesia Plan Comments: (Chronic  thrombocytopenia thought due to splenomegaly secondary to cirrhosis and portal hypertension. Followed by GI, Dr. Kalman Shan.   Recent labs showed high TSH, PCP titrated up her dose of synthroid.  Abd [place] 08/03/18: IMPRESSION: 1. No acute sonographic abnormality. 2. Coarsened hepatic echotexture with increased echogenicity can be seen in patients with hepatic steatosis or other hepatocellular disease. There is no discrete hepatic mass, however evaluation is limited by diffusely increased echogenicity. 3. Splenomegaly suggestive of underlying portal hypertension.)     Anesthesia Quick Evaluation

## 2018-09-01 ENCOUNTER — Other Ambulatory Visit: Payer: Self-pay

## 2018-09-04 ENCOUNTER — Other Ambulatory Visit (HOSPITAL_COMMUNITY)
Admission: RE | Admit: 2018-09-04 | Discharge: 2018-09-04 | Disposition: A | Discharge status: Home / Self Care | Payer: Medicare Other | Source: Ambulatory Visit | Attending: Orthopedic Surgery | Admitting: Orthopedic Surgery

## 2018-09-04 DIAGNOSIS — Z20828 Contact with and (suspected) exposure to other viral communicable diseases: Secondary | ICD-10-CM | POA: Insufficient documentation

## 2018-09-04 DIAGNOSIS — Z01812 Encounter for preprocedural laboratory examination: Secondary | ICD-10-CM | POA: Diagnosis present

## 2018-09-04 LAB — SARS CORONAVIRUS 2 (TAT 6-24 HRS): SARS Coronavirus 2: NEGATIVE

## 2018-09-06 MED ORDER — DEXTROSE 5 % IV SOLN
3.0000 g | INTRAVENOUS | Status: AC
  Administered 2018-09-07: 3 g via INTRAVENOUS
  Filled 2018-09-06: qty 3
  Filled 2018-09-06: qty 3000

## 2018-09-07 ENCOUNTER — Inpatient Hospital Stay (HOSPITAL_COMMUNITY): Payer: Medicare Other

## 2018-09-07 ENCOUNTER — Encounter (HOSPITAL_COMMUNITY)
Admission: RE | Disposition: A | Discharge status: Home / Self Care | Payer: Self-pay | Source: Home / Self Care | Attending: Orthopedic Surgery

## 2018-09-07 ENCOUNTER — Inpatient Hospital Stay (HOSPITAL_COMMUNITY): Payer: Medicare Other | Admitting: Certified Registered"

## 2018-09-07 ENCOUNTER — Other Ambulatory Visit: Payer: Self-pay

## 2018-09-07 ENCOUNTER — Inpatient Hospital Stay (HOSPITAL_COMMUNITY)
Admission: RE | Admit: 2018-09-07 | Discharge: 2018-09-08 | DRG: 483 | Disposition: A | Discharge status: Home / Self Care | Payer: Medicare Other | Attending: Orthopedic Surgery | Admitting: Orthopedic Surgery

## 2018-09-07 ENCOUNTER — Inpatient Hospital Stay (HOSPITAL_COMMUNITY): Payer: Medicare Other | Admitting: Physician Assistant

## 2018-09-07 ENCOUNTER — Encounter (HOSPITAL_COMMUNITY): Payer: Self-pay

## 2018-09-07 DIAGNOSIS — Z7951 Long term (current) use of inhaled steroids: Secondary | ICD-10-CM | POA: Diagnosis not present

## 2018-09-07 DIAGNOSIS — I10 Essential (primary) hypertension: Secondary | ICD-10-CM | POA: Diagnosis not present

## 2018-09-07 DIAGNOSIS — Z923 Personal history of irradiation: Secondary | ICD-10-CM

## 2018-09-07 DIAGNOSIS — Z79899 Other long term (current) drug therapy: Secondary | ICD-10-CM | POA: Diagnosis not present

## 2018-09-07 DIAGNOSIS — Z8249 Family history of ischemic heart disease and other diseases of the circulatory system: Secondary | ICD-10-CM

## 2018-09-07 DIAGNOSIS — F419 Anxiety disorder, unspecified: Secondary | ICD-10-CM | POA: Diagnosis not present

## 2018-09-07 DIAGNOSIS — M19011 Primary osteoarthritis, right shoulder: Principal | ICD-10-CM | POA: Diagnosis present

## 2018-09-07 DIAGNOSIS — E1140 Type 2 diabetes mellitus with diabetic neuropathy, unspecified: Secondary | ICD-10-CM | POA: Diagnosis not present

## 2018-09-07 DIAGNOSIS — Z885 Allergy status to narcotic agent status: Secondary | ICD-10-CM

## 2018-09-07 DIAGNOSIS — Z9049 Acquired absence of other specified parts of digestive tract: Secondary | ICD-10-CM

## 2018-09-07 DIAGNOSIS — Z888 Allergy status to other drugs, medicaments and biological substances status: Secondary | ICD-10-CM | POA: Diagnosis not present

## 2018-09-07 DIAGNOSIS — Z9889 Other specified postprocedural states: Secondary | ICD-10-CM

## 2018-09-07 DIAGNOSIS — F418 Other specified anxiety disorders: Secondary | ICD-10-CM | POA: Diagnosis not present

## 2018-09-07 DIAGNOSIS — Z794 Long term (current) use of insulin: Secondary | ICD-10-CM | POA: Diagnosis not present

## 2018-09-07 DIAGNOSIS — Z96651 Presence of right artificial knee joint: Secondary | ICD-10-CM | POA: Diagnosis not present

## 2018-09-07 DIAGNOSIS — Z853 Personal history of malignant neoplasm of breast: Secondary | ICD-10-CM | POA: Diagnosis not present

## 2018-09-07 DIAGNOSIS — G4730 Sleep apnea, unspecified: Secondary | ICD-10-CM | POA: Diagnosis present

## 2018-09-07 DIAGNOSIS — Z79890 Hormone replacement therapy (postmenopausal): Secondary | ICD-10-CM

## 2018-09-07 DIAGNOSIS — M19019 Primary osteoarthritis, unspecified shoulder: Secondary | ICD-10-CM | POA: Diagnosis present

## 2018-09-07 DIAGNOSIS — Z471 Aftercare following joint replacement surgery: Secondary | ICD-10-CM | POA: Diagnosis not present

## 2018-09-07 DIAGNOSIS — G8918 Other acute postprocedural pain: Secondary | ICD-10-CM | POA: Diagnosis not present

## 2018-09-07 DIAGNOSIS — E119 Type 2 diabetes mellitus without complications: Secondary | ICD-10-CM | POA: Diagnosis not present

## 2018-09-07 DIAGNOSIS — Z96611 Presence of right artificial shoulder joint: Secondary | ICD-10-CM | POA: Diagnosis not present

## 2018-09-07 HISTORY — PX: REVERSE SHOULDER ARTHROPLASTY: SHX5054

## 2018-09-07 LAB — GLUCOSE, CAPILLARY
Glucose-Capillary: 173 mg/dL — ABNORMAL HIGH (ref 70–99)
Glucose-Capillary: 138 mg/dL — ABNORMAL HIGH (ref 70–99)
Glucose-Capillary: 153 mg/dL — ABNORMAL HIGH (ref 70–99)
Glucose-Capillary: 214 mg/dL — ABNORMAL HIGH (ref 70–99)

## 2018-09-07 SURGERY — ARTHROPLASTY, SHOULDER, TOTAL, REVERSE
Anesthesia: Regional | Site: Shoulder | Laterality: Right

## 2018-09-07 MED ORDER — CHLORHEXIDINE GLUCONATE 4 % EX LIQD: 60.0000 mL | Freq: Once | CUTANEOUS | Status: DC

## 2018-09-07 MED ORDER — PROPOFOL 10 MG/ML IV BOLUS
INTRAVENOUS | Status: DC | PRN
  Administered 2018-09-07: 200 mg via INTRAVENOUS

## 2018-09-07 MED ORDER — HEMOSTATIC AGENTS (NO CHARGE) OPTIME
TOPICAL | Status: DC | PRN
  Administered 2018-09-07: 1 via TOPICAL

## 2018-09-07 MED ORDER — OXYCODONE HCL 5 MG PO TABS
5.0000 mg | ORAL_TABLET | ORAL | Status: DC | PRN
  Administered 2018-09-07: 10 mg via ORAL
  Administered 2018-09-08 (×2): 5 mg via ORAL
  Filled 2018-09-07: qty 1
  Filled 2018-09-07: qty 2
  Filled 2018-09-07: qty 1

## 2018-09-07 MED ORDER — PROMETHAZINE HCL 25 MG/ML IJ SOLN: 6.2500 mg | INTRAMUSCULAR | Status: DC | PRN

## 2018-09-07 MED ORDER — METOCLOPRAMIDE HCL 5 MG PO TABS: 5.0000 mg | ORAL_TABLET | Freq: Three times a day (TID) | ORAL | Status: DC | PRN

## 2018-09-07 MED ORDER — SUCCINYLCHOLINE CHLORIDE 200 MG/10ML IV SOSY
PREFILLED_SYRINGE | INTRAVENOUS | Status: DC | PRN
  Administered 2018-09-07: 160 mg via INTRAVENOUS

## 2018-09-07 MED ORDER — TRANEXAMIC ACID 1000 MG/10ML IV SOLN
INTRAVENOUS | Status: DC | PRN
  Administered 2018-09-07: 2000 mg via TOPICAL

## 2018-09-07 MED ORDER — VANCOMYCIN HCL 1000 MG IV SOLR
INTRAVENOUS | Status: AC
  Filled 2018-09-07: qty 1000

## 2018-09-07 MED ORDER — ASPIRIN 81 MG PO CHEW
81.0000 mg | CHEWABLE_TABLET | Freq: Two times a day (BID) | ORAL | Status: DC
  Administered 2018-09-07 – 2018-09-08 (×2): 81 mg via ORAL
  Filled 2018-09-07 (×2): qty 1

## 2018-09-07 MED ORDER — FENTANYL CITRATE (PF) 100 MCG/2ML IJ SOLN
50.0000 ug | Freq: Once | INTRAMUSCULAR | Status: AC
  Administered 2018-09-07: 50 ug via INTRAVENOUS

## 2018-09-07 MED ORDER — PHENYLEPHRINE 40 MCG/ML (10ML) SYRINGE FOR IV PUSH (FOR BLOOD PRESSURE SUPPORT)
PREFILLED_SYRINGE | INTRAVENOUS | Status: AC
  Filled 2018-09-07: qty 10

## 2018-09-07 MED ORDER — LIDOCAINE 2% (20 MG/ML) 5 ML SYRINGE
INTRAMUSCULAR | Status: DC | PRN
  Administered 2018-09-07: 60 mg via INTRAVENOUS
  Administered 2018-09-07: 40 mg via INTRAVENOUS

## 2018-09-07 MED ORDER — ROCURONIUM BROMIDE 50 MG/5ML IV SOSY
PREFILLED_SYRINGE | INTRAVENOUS | Status: DC | PRN
  Administered 2018-09-07: 50 mg via INTRAVENOUS

## 2018-09-07 MED ORDER — METHOCARBAMOL 500 MG PO TABS
500.0000 mg | ORAL_TABLET | Freq: Four times a day (QID) | ORAL | Status: DC | PRN
  Administered 2018-09-08: 500 mg via ORAL
  Filled 2018-09-07: qty 1

## 2018-09-07 MED ORDER — SUCCINYLCHOLINE CHLORIDE 200 MG/10ML IV SOSY
PREFILLED_SYRINGE | INTRAVENOUS | Status: AC
  Filled 2018-09-07: qty 10

## 2018-09-07 MED ORDER — EPHEDRINE 5 MG/ML INJ
INTRAVENOUS | Status: AC
  Filled 2018-09-07: qty 10

## 2018-09-07 MED ORDER — ACETAMINOPHEN 325 MG PO TABS: 325.0000 mg | ORAL_TABLET | Freq: Four times a day (QID) | ORAL | Status: DC | PRN

## 2018-09-07 MED ORDER — DOCUSATE SODIUM 100 MG PO CAPS
100.0000 mg | ORAL_CAPSULE | Freq: Two times a day (BID) | ORAL | Status: DC
  Administered 2018-09-07 – 2018-09-08 (×2): 100 mg via ORAL
  Filled 2018-09-07 (×2): qty 1

## 2018-09-07 MED ORDER — ALBUMIN HUMAN 5 % IV SOLN
INTRAVENOUS | Status: DC | PRN
  Administered 2018-09-07: 14:00:00 via INTRAVENOUS

## 2018-09-07 MED ORDER — MIDAZOLAM HCL 2 MG/2ML IJ SOLN
INTRAMUSCULAR | Status: AC
  Administered 2018-09-07: 1 mg via INTRAVENOUS
  Filled 2018-09-07: qty 2

## 2018-09-07 MED ORDER — TRANEXAMIC ACID-NACL 1000-0.7 MG/100ML-% IV SOLN
1000.0000 mg | INTRAVENOUS | Status: AC
  Administered 2018-09-07: 1000 mg via INTRAVENOUS
  Filled 2018-09-07: qty 100

## 2018-09-07 MED ORDER — LACTATED RINGERS IV SOLN
INTRAVENOUS | Status: DC
  Administered 2018-09-07: 11:00:00 via INTRAVENOUS

## 2018-09-07 MED ORDER — METHOCARBAMOL 1000 MG/10ML IJ SOLN
500.0000 mg | Freq: Four times a day (QID) | INTRAVENOUS | Status: DC | PRN
  Filled 2018-09-07: qty 5

## 2018-09-07 MED ORDER — SUGAMMADEX SODIUM 200 MG/2ML IV SOLN
INTRAVENOUS | Status: DC | PRN
  Administered 2018-09-07: 200 mg via INTRAVENOUS

## 2018-09-07 MED ORDER — ROCURONIUM BROMIDE 10 MG/ML (PF) SYRINGE
PREFILLED_SYRINGE | INTRAVENOUS | Status: AC
  Filled 2018-09-07: qty 10

## 2018-09-07 MED ORDER — ONDANSETRON HCL 4 MG PO TABS: 4.0000 mg | ORAL_TABLET | Freq: Four times a day (QID) | ORAL | Status: DC | PRN

## 2018-09-07 MED ORDER — GABAPENTIN 300 MG PO CAPS
300.0000 mg | ORAL_CAPSULE | Freq: Three times a day (TID) | ORAL | Status: DC
  Administered 2018-09-07 – 2018-09-08 (×2): 300 mg via ORAL
  Filled 2018-09-07 (×2): qty 1

## 2018-09-07 MED ORDER — LACTATED RINGERS IV SOLN
INTRAVENOUS | Status: AC
  Administered 2018-09-07: 19:00:00 via INTRAVENOUS

## 2018-09-07 MED ORDER — LACTATED RINGERS IV SOLN
INTRAVENOUS | Status: DC | PRN
  Administered 2018-09-07 (×2): via INTRAVENOUS

## 2018-09-07 MED ORDER — EPHEDRINE SULFATE-NACL 50-0.9 MG/10ML-% IV SOSY
PREFILLED_SYRINGE | INTRAVENOUS | Status: DC | PRN
  Administered 2018-09-07 (×4): 10 mg via INTRAVENOUS
  Administered 2018-09-07: 5 mg via INTRAVENOUS

## 2018-09-07 MED ORDER — VANCOMYCIN HCL 1000 MG IV SOLR
INTRAVENOUS | Status: DC | PRN
  Administered 2018-09-07: 1000 mg

## 2018-09-07 MED ORDER — SODIUM CHLORIDE 0.9 % IV SOLN
INTRAVENOUS | Status: DC | PRN
  Administered 2018-09-07: 60 ug/min via INTRAVENOUS
  Administered 2018-09-07: 16:00:00 via INTRAVENOUS

## 2018-09-07 MED ORDER — FENTANYL CITRATE (PF) 100 MCG/2ML IJ SOLN: 25.0000 ug | INTRAMUSCULAR | Status: DC | PRN

## 2018-09-07 MED ORDER — CEFAZOLIN SODIUM-DEXTROSE 2-4 GM/100ML-% IV SOLN
2.0000 g | Freq: Four times a day (QID) | INTRAVENOUS | Status: AC
  Administered 2018-09-07 – 2018-09-08 (×3): 2 g via INTRAVENOUS
  Filled 2018-09-07 (×3): qty 100

## 2018-09-07 MED ORDER — PHENOL 1.4 % MT LIQD: 1.0000 | OROMUCOSAL | Status: DC | PRN

## 2018-09-07 MED ORDER — LIDOCAINE 2% (20 MG/ML) 5 ML SYRINGE
INTRAMUSCULAR | Status: AC
  Filled 2018-09-07: qty 5

## 2018-09-07 MED ORDER — DEXAMETHASONE SODIUM PHOSPHATE 10 MG/ML IJ SOLN
INTRAMUSCULAR | Status: AC
  Filled 2018-09-07: qty 1

## 2018-09-07 MED ORDER — ACETAMINOPHEN 10 MG/ML IV SOLN: 1000.0000 mg | Freq: Once | INTRAVENOUS | Status: DC | PRN

## 2018-09-07 MED ORDER — 0.9 % SODIUM CHLORIDE (POUR BTL) OPTIME
TOPICAL | Status: DC | PRN
  Administered 2018-09-07 (×5): 1000 mL

## 2018-09-07 MED ORDER — ONDANSETRON HCL 4 MG/2ML IJ SOLN: 4.0000 mg | Freq: Four times a day (QID) | INTRAMUSCULAR | Status: DC | PRN

## 2018-09-07 MED ORDER — TRANEXAMIC ACID 1000 MG/10ML IV SOLN
2000.0000 mg | INTRAVENOUS | Status: DC
  Filled 2018-09-07: qty 20

## 2018-09-07 MED ORDER — FENTANYL CITRATE (PF) 250 MCG/5ML IJ SOLN
INTRAMUSCULAR | Status: AC
  Filled 2018-09-07: qty 5

## 2018-09-07 MED ORDER — HYDROMORPHONE HCL 1 MG/ML IJ SOLN: 0.5000 mg | INTRAMUSCULAR | Status: DC | PRN

## 2018-09-07 MED ORDER — METOCLOPRAMIDE HCL 5 MG/ML IJ SOLN: 5.0000 mg | Freq: Three times a day (TID) | INTRAMUSCULAR | Status: DC | PRN

## 2018-09-07 MED ORDER — BUPIVACAINE-EPINEPHRINE (PF) 0.5% -1:200000 IJ SOLN
INTRAMUSCULAR | Status: DC | PRN
  Administered 2018-09-07: 15 mL via PERINEURAL

## 2018-09-07 MED ORDER — PHENYLEPHRINE 40 MCG/ML (10ML) SYRINGE FOR IV PUSH (FOR BLOOD PRESSURE SUPPORT)
PREFILLED_SYRINGE | INTRAVENOUS | Status: DC | PRN
  Administered 2018-09-07: 120 ug via INTRAVENOUS
  Administered 2018-09-07: 80 ug via INTRAVENOUS
  Administered 2018-09-07: 120 ug via INTRAVENOUS
  Administered 2018-09-07 (×2): 80 ug via INTRAVENOUS

## 2018-09-07 MED ORDER — PROPOFOL 10 MG/ML IV BOLUS
INTRAVENOUS | Status: AC
  Filled 2018-09-07: qty 20

## 2018-09-07 MED ORDER — MENTHOL 3 MG MT LOZG: 1.0000 | LOZENGE | OROMUCOSAL | Status: DC | PRN

## 2018-09-07 MED ORDER — FENTANYL CITRATE (PF) 100 MCG/2ML IJ SOLN
INTRAMUSCULAR | Status: DC | PRN
  Administered 2018-09-07 (×2): 50 ug via INTRAVENOUS
  Administered 2018-09-07: 100 ug via INTRAVENOUS

## 2018-09-07 MED ORDER — DEXAMETHASONE SODIUM PHOSPHATE 10 MG/ML IJ SOLN
INTRAMUSCULAR | Status: DC | PRN
  Administered 2018-09-07: 6 mg via INTRAVENOUS

## 2018-09-07 MED ORDER — MIDAZOLAM HCL 2 MG/2ML IJ SOLN
1.0000 mg | Freq: Once | INTRAMUSCULAR | Status: AC
  Administered 2018-09-07: 1 mg via INTRAVENOUS

## 2018-09-07 MED ORDER — BUPIVACAINE LIPOSOME 1.3 % IJ SUSP
INTRAMUSCULAR | Status: DC | PRN
  Administered 2018-09-07: 10 mL via PERINEURAL

## 2018-09-07 MED ORDER — ONDANSETRON HCL 4 MG/2ML IJ SOLN
INTRAMUSCULAR | Status: AC
  Filled 2018-09-07: qty 2

## 2018-09-07 MED ORDER — OXYCODONE HCL 5 MG PO TABS
10.0000 mg | ORAL_TABLET | ORAL | Status: DC | PRN
  Administered 2018-09-08: 10 mg via ORAL
  Filled 2018-09-07: qty 2

## 2018-09-07 MED ORDER — FENTANYL CITRATE (PF) 100 MCG/2ML IJ SOLN
INTRAMUSCULAR | Status: AC
  Administered 2018-09-07: 12:00:00 50 ug via INTRAVENOUS
  Filled 2018-09-07: qty 2

## 2018-09-07 SURGICAL SUPPLY — 94 items
ALCOHOL 70% 16 OZ (MISCELLANEOUS) ×2 IMPLANT
APL PRP STRL LF DISP 70% ISPRP (MISCELLANEOUS) ×1
APL SKNCLS STERI-STRIP NONHPOA (GAUZE/BANDAGES/DRESSINGS) ×1
BEARING HUMERAL SHLDER 36M STD (Shoulder) ×1 IMPLANT
BENZOIN TINCTURE PRP APPL 2/3 (GAUZE/BANDAGES/DRESSINGS) ×2 IMPLANT
BIT DRILL 2.7 W/STOP DISP (BIT) ×2 IMPLANT
BIT DRILL F/CENTRAL SCRW 3.2 (BIT) ×1
BIT DRILL F/CENTRAL SCRW 3.2MM (BIT) ×1 IMPLANT
BIT DRILL TWIST 2.7 (BIT) ×2 IMPLANT
BLADE SAW SGTL 13X75X1.27 (BLADE) ×2 IMPLANT
BRNG HUM STD 36 RVRS SHLDR (Shoulder) ×1 IMPLANT
BSPLAT GLND LRG AUG TPR ADPR (Joint) ×1 IMPLANT
CHLORAPREP W/TINT 26 (MISCELLANEOUS) ×2 IMPLANT
CLSR STERI-STRIP ANTIMIC 1/2X4 (GAUZE/BANDAGES/DRESSINGS) ×2 IMPLANT
COMP REV AUG LG W/TAPER/GLENOI (Joint) ×2 IMPLANT
COMPONENT RV AUG LG W/TAPR/GLN (Joint) ×1 IMPLANT
COVER SURGICAL LIGHT HANDLE (MISCELLANEOUS) ×2 IMPLANT
COVER WAND RF STERILE (DRAPES) ×2 IMPLANT
DRAPE INCISE IOBAN 66X45 STRL (DRAPES) ×2 IMPLANT
DRAPE U-SHAPE 47X51 STRL (DRAPES) ×4 IMPLANT
DRILL BIT F/CENTRAL SCRW 3.2MM (BIT) ×2
DRSG AQUACEL AG ADV 3.5X10 (GAUZE/BANDAGES/DRESSINGS) ×2 IMPLANT
ELECT BLADE 4.0 EZ CLEAN MEGAD (MISCELLANEOUS) ×2
ELECT REM PT RETURN 9FT ADLT (ELECTROSURGICAL) ×2
ELECTRODE BLDE 4.0 EZ CLN MEGD (MISCELLANEOUS) ×1 IMPLANT
ELECTRODE REM PT RTRN 9FT ADLT (ELECTROSURGICAL) ×1 IMPLANT
GAUZE SPONGE 4X4 12PLY STRL LF (GAUZE/BANDAGES/DRESSINGS) ×2 IMPLANT
GLENOID SPHERE STD STRL 36MM (Orthopedic Implant) ×2 IMPLANT
GLOVE BIO SURGEON STRL SZ 6.5 (GLOVE) ×2 IMPLANT
GLOVE BIO SURGEON STRL SZ7 (GLOVE) ×4 IMPLANT
GLOVE BIOGEL PI IND STRL 7.0 (GLOVE) ×2 IMPLANT
GLOVE BIOGEL PI IND STRL 7.5 (GLOVE) ×1 IMPLANT
GLOVE BIOGEL PI IND STRL 8 (GLOVE) ×1 IMPLANT
GLOVE BIOGEL PI INDICATOR 7.0 (GLOVE) ×2
GLOVE BIOGEL PI INDICATOR 7.5 (GLOVE) ×1
GLOVE BIOGEL PI INDICATOR 8 (GLOVE) ×1
GLOVE ECLIPSE 7.0 STRL STRAW (GLOVE) ×2 IMPLANT
GLOVE INDICATOR 7.0 STRL GRN (GLOVE) ×4 IMPLANT
GLOVE SURG ORTHO 8.0 STRL STRW (GLOVE) ×2 IMPLANT
GOWN STRL REUS W/ TWL LRG LVL3 (GOWN DISPOSABLE) ×3 IMPLANT
GOWN STRL REUS W/ TWL XL LVL3 (GOWN DISPOSABLE) ×1 IMPLANT
GOWN STRL REUS W/TWL LRG LVL3 (GOWN DISPOSABLE) ×6
GOWN STRL REUS W/TWL XL LVL3 (GOWN DISPOSABLE) ×2
GUIDE MODEL REV SHLD RT (ORTHOPEDIC DISPOSABLE SUPPLIES) ×2 IMPLANT
HYDROGEN PEROXIDE 16OZ (MISCELLANEOUS) ×2 IMPLANT
JET LAVAGE IRRISEPT WOUND (IRRIGATION / IRRIGATOR) ×2
KIT BASIN OR (CUSTOM PROCEDURE TRAY) ×2 IMPLANT
KIT TURNOVER KIT B (KITS) ×2 IMPLANT
LAVAGE JET IRRISEPT WOUND (IRRIGATION / IRRIGATOR) ×1 IMPLANT
LOOP VESSEL MAXI BLUE (MISCELLANEOUS) ×2 IMPLANT
MANIFOLD NEPTUNE II (INSTRUMENTS) ×2 IMPLANT
MARKER SKIN DUAL TIP RULER LAB (MISCELLANEOUS) ×2 IMPLANT
NDL SUT 6 .5 CRC .975X.05 MAYO (NEEDLE) ×2 IMPLANT
NEEDLE 1/2 CIR CATGUT .05X1.09 (NEEDLE) ×2 IMPLANT
NEEDLE MAYO TAPER (NEEDLE) ×4
NS IRRIG 1000ML POUR BTL (IV SOLUTION) ×2 IMPLANT
PACK SHOULDER (CUSTOM PROCEDURE TRAY) ×2 IMPLANT
PAD ARMBOARD 7.5X6 YLW CONV (MISCELLANEOUS) ×4 IMPLANT
PIN HUMERAL STMN 3.2MMX9IN (INSTRUMENTS) ×2 IMPLANT
REAMER GUIDE BUSHING SURG DISP (MISCELLANEOUS) ×2 IMPLANT
REAMER GUIDE W/SCREW AUG (MISCELLANEOUS) ×2 IMPLANT
RETRIEVER SUT HEWSON (MISCELLANEOUS) ×2 IMPLANT
SCREW BONE LOCKING 4.75X30X3.5 (Screw) ×2 IMPLANT
SCREW BONE LOCKING 4.75X35X3.5 (Screw) ×2 IMPLANT
SCREW BONE STRL 6.5MMX25MM (Screw) ×2 IMPLANT
SCREW LOCKING 4.75MMX15MM (Screw) ×4 IMPLANT
SHOULDER HUMERAL BEAR 36M STD (Shoulder) ×2 IMPLANT
SLING ARM IMMOBILIZER LRG (SOFTGOODS) ×2 IMPLANT
SOL PREP POV-IOD 4OZ 10% (MISCELLANEOUS) ×2 IMPLANT
SPONGE LAP 18X18 RF (DISPOSABLE) ×2 IMPLANT
SPONGE LAP 18X18 X RAY DECT (DISPOSABLE) ×2 IMPLANT
SPONGE SURGIFOAM ABS GEL SZ50 (HEMOSTASIS) ×2 IMPLANT
STEM HUMERAL STRL 8MMX83MM (Stem) ×2 IMPLANT
STRIP CLOSURE SKIN 1/2X4 (GAUZE/BANDAGES/DRESSINGS) ×2 IMPLANT
SUCTION FRAZIER HANDLE 10FR (MISCELLANEOUS) ×1
SUCTION TUBE FRAZIER 10FR DISP (MISCELLANEOUS) ×1 IMPLANT
SUT BROADBAND TAPE 2PK 1.5 (SUTURE) ×6 IMPLANT
SUT FIBERWIRE #2 38 T-5 BLUE (SUTURE)
SUT MAXBRAID (SUTURE) IMPLANT
SUT MNCRL AB 3-0 PS2 18 (SUTURE) ×2 IMPLANT
SUT SILK 2 0 TIES 10X30 (SUTURE) ×2 IMPLANT
SUT VIC AB 0 CT1 27 (SUTURE) ×8
SUT VIC AB 0 CT1 27XBRD ANBCTR (SUTURE) ×4 IMPLANT
SUT VIC AB 1 CT1 27 (SUTURE) ×6
SUT VIC AB 1 CT1 27XBRD ANBCTR (SUTURE) ×3 IMPLANT
SUT VIC AB 2-0 CT1 27 (SUTURE) ×6
SUT VIC AB 2-0 CT1 TAPERPNT 27 (SUTURE) ×3 IMPLANT
SUTURE FIBERWR #2 38 T-5 BLUE (SUTURE) IMPLANT
TOWEL GREEN STERILE (TOWEL DISPOSABLE) ×2 IMPLANT
TRAY FOL W/BAG SLVR 16FR STRL (SET/KITS/TRAYS/PACK) IMPLANT
TRAY FOLEY W/BAG SLVR 16FR LF (SET/KITS/TRAYS/PACK)
TRAY HUM REV SHOULDER STD +6 (Shoulder) ×2 IMPLANT
TUBE CONNECTING 12X1/4 (SUCTIONS) ×2 IMPLANT
WATER STERILE IRR 1000ML POUR (IV SOLUTION) ×2 IMPLANT

## 2018-09-07 NOTE — Brief Op Note (Signed)
   09/07/2018  4:52 PM  PATIENT:  [name] [name]  68 y.o. female  PRE-OPERATIVE DIAGNOSIS:  RIGHT SHOULDER OSTEOARTHRITIS  POST-OPERATIVE DIAGNOSIS:  RIGHT SHOULDER OSTEOARTHRITIS  PROCEDURE:  Procedure(s): RIGHT REVERSE SHOULDER ARTHROPLASTY  SURGEON:  Surgeon(s): Marlou Sa, Tonna Corner, MD  ASSISTANT: Annie Main PA  ANESTHESIA:   general  EBL: 250 ml    Total I/O In: 1250 [I.V.:1000; IV Piggyback:250] Out: 250 [Blood:250]  BLOOD ADMINISTERED: none  DRAINS: none   LOCAL MEDICATIONS USED:  none  SPECIMEN:  No Specimen  COUNTS:  YES  TOURNIQUET:  * No tourniquets in log *  DICTATION: .Other Dictation: Dictation Number [id number]  PLAN OF CARE: Admit for overnight observation  PATIENT DISPOSITION:  PACU - hemodynamically stable

## 2018-09-07 NOTE — Transfer of Care (Signed)
Immediate Anesthesia Transfer of Care Note  Patient: [name] [name]  Procedure(s) Performed: RIGHT REVERSE SHOULDER ARTHROPLASTY (Right Shoulder)  Patient Location: PACU  Anesthesia Type:General and Regional  Level of Consciousness: awake and patient cooperative  Airway & Oxygen Therapy: Patient Spontanous Breathing and Patient connected to face mask oxygen  Post-op Assessment: Report given to RN and Post -op Vital signs reviewed and stable  Post vital signs: Reviewed and stable  Last Vitals:  Vitals Value Taken Time  BP 141/79 09/07/18 1650  Temp    Pulse 97 09/07/18 1650  Resp 18 09/07/18 1650  SpO2 85 % 09/07/18 1650  Vitals shown include unvalidated device data.  Last Pain:  Vitals:   09/07/18 1213  TempSrc:   PainSc: 0-No pain      Patients Stated Pain Goal: 2 (36/06/77 0340)  Complications: No apparent anesthesia complications

## 2018-09-07 NOTE — Progress Notes (Signed)
NEW ADMISSION NOTE New Admission Note:   Arrival Method: from PACU by bed Mental Orientation: axox4 Telemetry:none  Assessment: Completed Skin: see assessment  IV: left hand 20G Pain: denies  Tubes: Safety Measures: Safety Fall Prevention Plan has been discussed  Admission: pending  [facility] [place] Orientation: Patient has been orientated to the room, unit and staff.  Family: husband at the bedside   Orders have been reviewed and implemented. Will continue to monitor the patient. Call light has been placed within reach and bed alarm has been activated.   Paulla Fore, RN
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
## 2018-09-07 NOTE — Progress Notes (Signed)
Pt stable vss Dressing dry Post op xrays ok ot am dc late am Oxy and rob as dc meds

## 2018-09-07 NOTE — Op Note (Signed)
NAME: [name] [name], [name] [name] MEDICAL RECORD MO:[id number] ACCOUNT [id number] DATE OF BIRTH:12-13-1950 FACILITY: [facility] LOCATION: [facility] PHYSICIAN:GREGORY Randel Pigg, MD  OPERATIVE REPORT  DATE OF PROCEDURE:  09/07/2018  PREOPERATIVE DIAGNOSIS:  Right shoulder arthritis.  POSTOPERATIVE DIAGNOSIS:  Right shoulder arthritis.  PROCEDURE:  Right shoulder reverse shoulder replacement using [employer] implants including mini humeral stem size 8 with mini humeral tray standard thickness +6 mm taper offset 40 mm diameter with 36 mm standard bearing and large augmented baseplate  with 1 central screw and 4 peripheral fixed locking screws with glenosphere standard 36 mm size.  SURGEON:  Meredith Pel, MD  ASSISTANT:  Annie Main.  INDICATIONS:  This is a 68 year old patient with severe end-stage right shoulder arthritis, who presents for operative management after explanation of risks and benefits.  PROCEDURE IN DETAIL:  The patient was brought to the operating room where general endotracheal anesthesia was induced.  Preoperative antibiotics administered.  Timeout was called.  The patient was placed in the beach chair position with the head in  neutral position.  Right arm was then prescrubbed with hydrogen peroxide, then alcohol and Betadine, which was allowed to air dry, then ChloraPrep solution and draped in a sterile manner.  Ioban used to cover the operative field.  Timeout was called.   Deltopectoral approach to the shoulder was made.  Skin and subcutaneous tissue were sharply divided.  Cephalic vein mobilized medially.  Deltoid mobilized off of the subacromial space and subdeltoid space.  Deltoid was also elevated partially along its  anterior attachment for further mobilization.  The patient had fairly limited preoperative range of motion to about 25 degrees of external rotation and about 50 degrees of forward flexion and abduction.  The biceps tendon was identified and tenodesed to  the  pectoralis major tendon.  The rotator interval was opened.  The circumflex vessels were ligated.  The axillary nerve was then visualized and palpated and a vessel loop was placed around it.  He was under some tension natively.  The axillary nerve was  then protected at all times during the remaining portion of the case.  At this time, subscapularis was taken off the lesser tuberosity.  It was tagged.  The circumferential release of the subscapularis was performed and the coracohumeral ligament was  also released.  The rotator interval was opened up to the glenoid.  At this time, soft tissue dissection was performed around to the 5 o'clock position on the humeral head.  The capsule was also elevated off the inferior humeral neck for about 2 cm.   Osteophyte was removed.  At this time, with the head dislocated, the head was cut in about 30 degrees of retroversion.  Then, a cap was placed after reaming up to size 8.  At this time, attention was then directed towards the glenoid.  Circumferential  release of the capsule and labrum was performed.  Anterior and posterior retractors were placed.  Axillary nerve was protected.  The guide was placed in accordance with preoperative templating.  Reaming was then performed about 8-9 mm primarily  inferiorly.  Superior reaming was then performed and the glenoid baseplate was placed in good position with augment primarily superiorly.  One central screw and 4 peripheral locking screws were placed with excellent purchase obtained.  A standard  glenosphere was then placed.  Attention was then directed towards the humerus.  Broaching performed up to size 8.  Planing of the head was performed.  It should be noted that the  supraspinatus was intact, but in order to facilitate mobilization, it was  partially released and then repaired at the end of the case.  At this time, the trial reduction was performed.  The space was tight.  Trial reduction was performed with the 40 mm  standard thickness +6 taper offset humeral tray and 36 standard bearing.   This gave excellent stability and also good range of motion with forward flexion, abduction both above 90 degrees.  External rotation was to about 40 degrees with the subscap repaired in its anticipated repaired position.  Thorough irrigation was then  performed again with the IrriSept solution and normal saline.  The true stem was placed.  Sutures were placed around the lesser tuberosity and into the stem.  Subscapularis was then repaired with the arm in about 30 degrees of external rotation.  The  rotator interval was then also repaired after irrigating again and placing vancomycin powder within both the joint and the incision.  Rotator interval was then closed using 0 Vicryl suture.  At this time, thorough irrigation again performed and the  deltopectoral interval was closed after removing the vessel loop around the axillary nerve, which was again palpated and found to be intact and under mild tension comparable to its preoperative status.  The deltopectoral interval was then closed using 0  Vicryl suture, 2-0 Vicryl suture and 3-0 Monocryl.  It should be noted also TXA sponge was placed and left in there for about 3 minutes before placing the components.  One area of posterior bleeding was treated with cautery and Gelfoam.  Aquacel dressing  was placed along with a shoulder immobilizer.  The patient tolerated the procedure well without immediate complication.  Luke's assistance was required at all times during the case for retraction and mobilization of the tissue as well as opening and  closing.  His assistance was of medical necessity.  TN/NUANCE  D:09/07/2018 T:09/07/2018 JOB:[id number]

## 2018-09-07 NOTE — Anesthesia Procedure Notes (Signed)
Procedure Name: Intubation Date/Time: 09/07/2018 12:44 PM Performed by: Orlie Dakin, CRNA Pre-anesthesia Checklist: Patient identified, Emergency Drugs available, Suction available and Patient being monitored Patient Re-evaluated:Patient Re-evaluated prior to induction Oxygen Delivery Method: Circle system utilized Preoxygenation: Pre-oxygenation with 100% oxygen Induction Type: IV induction Laryngoscope Size: [name] and 3 Grade View: Grade I Tube type: Oral Tube size: 7.0 mm Number of attempts: 1 Airway Equipment and Method: Stylet Placement Confirmation: ETT inserted through vocal cords under direct vision,  positive ETCO2 and breath sounds checked- equal and bilateral Secured at: 23 cm Tube secured with: Tape Dental Injury: Teeth and Oropharynx as per pre-operative assessment

## 2018-09-07 NOTE — Anesthesia Procedure Notes (Signed)
Anesthesia Regional Block: Interscalene brachial plexus block   Pre-Anesthetic Checklist: ,, timeout performed, Correct Patient, Correct Site, Correct Laterality, Correct Procedure, Correct Position, site marked, Risks and benefits discussed, pre-op evaluation,  At surgeon's request and post-op pain management  Laterality: Right  Prep: Maximum Sterile Barrier Precautions used, chloraprep       Needles:  Injection technique: Single-shot  Needle Type: Echogenic Stimulator Needle     Needle Length: 9cm  Needle Gauge: 22     Additional Needles:   Procedures:,,,, ultrasound used (permanent image in chart),,,,  Narrative:  Start time: 09/07/2018 11:58 AM End time: 09/07/2018 12:01 PM Injection made incrementally with aspirations every 5 mL.  Performed by: Personally  Anesthesiologist: Brennan Bailey, MD  Additional Notes: Risks, benefits, and alternative discussed. Patient gave consent for procedure. Patient prepped and draped in sterile fashion. Sedation administered, patient remains easily responsive to voice. Relevant anatomy identified with ultrasound guidance. Local anesthetic given in 5cc increments with no signs or symptoms of intravascular injection. No pain or paraesthesias with injection. Patient monitored throughout procedure with signs of LAST or immediate complications. Tolerated well. Ultrasound image placed in chart.  Tawny Asal, MD

## 2018-09-08 ENCOUNTER — Telehealth: Payer: Self-pay | Admitting: Orthopedic Surgery

## 2018-09-08 ENCOUNTER — Encounter (HOSPITAL_COMMUNITY): Payer: Self-pay | Admitting: Orthopedic Surgery

## 2018-09-08 LAB — GLUCOSE, CAPILLARY
Glucose-Capillary: 201 mg/dL — ABNORMAL HIGH (ref 70–99)
Glucose-Capillary: 181 mg/dL — ABNORMAL HIGH (ref 70–99)

## 2018-09-08 MED ORDER — OXYCODONE HCL 5 MG PO TABS: 5.0000 mg | ORAL_TABLET | ORAL | 0 refills | Status: DC | PRN

## 2018-09-08 MED ORDER — METHOCARBAMOL 500 MG PO TABS: 500.0000 mg | ORAL_TABLET | Freq: Four times a day (QID) | ORAL | 0 refills | Status: DC | PRN

## 2018-09-08 NOTE — Plan of Care (Signed)

## 2018-09-08 NOTE — Progress Notes (Signed)
Dr. Forbes Cellar nurse notified at his office about patient not receiving Lantus since hospitalized. CBG this morning is 201. Requested that Dr. Marlou Sa notify me if Lantus to be given today.

## 2018-09-08 NOTE — Telephone Encounter (Signed)
Please advise. Thanks.  

## 2018-09-08 NOTE — Evaluation (Signed)
Physical Therapy Evaluation Patient Details Name: [name] [name] MRN: [id number] DOB: February 25, 1950 Today's Date: 09/08/2018   History of Present Illness  Pt is a 68 y.o. female s/p elective R reverse shoulder replacement 09/07/18. PMH includes diabetic neuropathy, gout, HTN, L breast CA, arthritis, bilateral TKA.    Clinical Impression  Patient evaluated by Physical Therapy with no further acute PT needs identified. PTA, pt indep with ambulation, husband assists with ADLs as needed. Today, pt ambulating with min guard for balance. Sling readjusted and reviewed RUE ROM/WB restrictions. Pt with c/o dizziness with mobility, pale and diaphoretic; BP down to 95/55 (RN notified). PT is signing off. Thank you for this referral. Acute OT to evaluate prior to pt's discharge today.    Follow Up Recommendations Follow surgeon's recommendation for DC plan and follow-up therapies;Supervision for mobility/OOB(see OT follow-up recommendations)    Equipment Recommendations  None recommended by PT    Recommendations for Other Services       Precautions / Restrictions Precautions Precautions: Fall Precaution Comments: Bilateral neuropathy Required Braces or Orthoses: Sling Restrictions Weight Bearing Restrictions: Yes RUE Weight Bearing: Non weight bearing Other Position/Activity Restrictions: Okay for AROM/PROM R shoulder flexion 0-90, abduction 0-60, external rotation 0-30; unlimited finger/wrist/elbow AROM/PROM      Mobility  Bed Mobility               General bed mobility comments: Received sitting in recliner  Transfers Overall transfer level: Needs assistance Equipment used: None Transfers: Sit to/from Stand Sit to Stand: Supervision         General transfer comment: Reliant on momentum and LUE support to power into standing; no physical assist required  Ambulation/Gait Ambulation/Gait assistance: Min guard Gait Distance (Feet): 40 Feet Assistive device: None;1 person hand held  assist Gait Pattern/deviations: Step-through pattern;Decreased stride length;Wide base of support Gait velocity: Decreased   General Gait Details: Slow, waddling-type gait with wide BOS, pt reports due to neuropathy in feet; initial HHA for balance, progressing to no UE support. Pt c/o dizziness and pallor noted; BP down to 95/55  Stairs            Wheelchair Mobility    Modified Rankin (Stroke Patients Only)       Balance Overall balance assessment: Needs assistance   Sitting balance-Leahy Scale: Good       Standing balance-Leahy Scale: Fair                               Pertinent Vitals/Pain Pain Assessment: Faces Faces Pain Scale: Hurts little more Pain Location: LUE (R shoulder still numb) Pain Descriptors / Indicators: Sore Pain Intervention(s): Monitored during session    Home Living Family/patient expects to be discharged to:: Private residence Living Arrangements: Spouse/significant other Available Help at Discharge: Family;Available 24 hours/day Type of Home: House Home Access: Ramped entrance;Stairs to enter Entrance Stairs-Rails: Right;Left Entrance Stairs-Number of Steps: 1 Home Layout: One level Home Equipment: Walker - 2 wheels;Cane - single point;Grab bars - toilet;Grab bars - tub/shower;Hand held shower head;Bedside commode Additional Comments: Husband has outfitted house with rails as needed. Only thing pt needs is a shower chair that will fit in small space    Prior Function Level of Independence: Needs assistance   Gait / Transfers Assistance Needed: Indep with ambulation; recently successfully treated by vestibular PT for vertigo  ADL's / Homemaking Assistance Needed: Husband assists with posterior pericare, assists with bathing while pt stands in shower; HHA  as needed for balance        Hand Dominance   Dominant Hand: Right    Extremity/Trunk Assessment   Upper Extremity Assessment Upper Extremity Assessment: RUE  deficits/detail;Defer to OT evaluation RUE Deficits / Details: s/p reverse TSA    Lower Extremity Assessment Lower Extremity Assessment: RLE deficits/detail;LLE deficits/detail RLE Deficits / Details: Functionally >3/5 throughout RLE Sensation: history of peripheral neuropathy LLE Deficits / Details: Functionally >3/5 throughout LLE Sensation: history of peripheral neuropathy       Communication   Communication: No difficulties  Cognition Arousal/Alertness: Awake/alert Behavior During Therapy: WFL for tasks assessed/performed Overall Cognitive Status: Within Functional Limits for tasks assessed                                        General Comments General comments (skin integrity, edema, etc.): Husband present and supportive    Exercises     Assessment/Plan    PT Assessment Patent does not need any further PT services  PT Problem List         PT Treatment Interventions      PT Goals (Current goals can be found in the Care Plan section)  Acute Rehab PT Goals PT Goal Formulation: All assessment and education complete, DC therapy Time For Goal Achievement: 09/22/18 Potential to Achieve Goals: Good    Frequency     Barriers to discharge        Co-evaluation               AM-PAC PT "6 Clicks" Mobility  Outcome Measure Help needed turning from your back to your side while in a flat bed without using bedrails?: A Little Help needed moving from lying on your back to sitting on the side of a flat bed without using bedrails?: A Little Help needed moving to and from a bed to a chair (including a wheelchair)?: A Little Help needed standing up from a chair using your arms (e.g., wheelchair or bedside chair)?: A Little Help needed to walk in hospital room?: A Little Help needed climbing 3-5 steps with a railing? : A Little 6 Click Score: 18    End of Session Equipment Utilized During Treatment: Other (comment)(RUE sling) Activity Tolerance:  Patient tolerated treatment well;Treatment limited secondary to medical complications (Comment) Patient left: in chair;with call bell/phone within reach Nurse Communication: Mobility status PT Visit Diagnosis: Other abnormalities of gait and mobility (R26.89);Pain    Time: 0940-1005 PT Time Calculation (min) (ACUTE ONLY): 25 min   Charges:   PT Evaluation $PT Eval Moderate Complexity: 1 Mod PT Treatments $Gait Training: 8-22 mins   Mabeline Caras, PT, DPT Acute Rehabilitation Services  Pager [phone] Office [place] 09/08/2018, 12:30 PM

## 2018-09-08 NOTE — Evaluation (Signed)
Occupational Therapy Evaluation and Discharge Patient Details Name: [name] [name] MRN: [id number] DOB: February 04, 1950 Today's Date: 09/08/2018    History of Present Illness Pt is a 68 y.o. female s/p elective R reverse shoulder replacement 09/07/18. PMH includes diabetic neuropathy, gout, HTN, L breast CA, arthritis, bilateral TKA, vertigo   Clinical Impression   This 68 yo female admitted and underwent above presents to acute OT with all education and hand outs provided to pt and husband. No further acute OT needs, we will D/C.    Follow Up Recommendations  Follow surgeon's recommendation for DC plan and follow-up therapies    Equipment Recommendations  None recommended by OT       Precautions / Restrictions Precautions Precautions: Fall;Shoulder Type of Shoulder Precautions: elbow, wrist, hand=YES, PROM/AROM FF 0-90, PROM/AROM ABD 0-60, PROM/AROM Ext rotation 0-30, Precaution Comments: Bilateral neuropathy Required Braces or Orthoses: Sling Restrictions Weight Bearing Restrictions: Yes RUE Weight Bearing: Non weight bearing Other Position/Activity Restrictions: Okay for AROM/PROM R shoulder flexion 0-90, abduction 0-60, external rotation 0-30; unlimited finger/wrist/elbow AROM/PROM      Mobility Bed Mobility Overal bed mobility: Needs Assistance Bed Mobility: Supine to Sit     Supine to sit: Min assist      Transfers Overall transfer level: Needs assistance Equipment used: None Transfers: Sit to/from Stand Sit to Stand: Supervision         General transfer comment: transfer from bed to recliner min guard A    Balance Overall balance assessment: Needs assistance   Sitting balance-Leahy Scale: Good       Standing balance-Leahy Scale: Fair                             ADL either performed or assessed with clinical judgement   ADL                                         General ADL Comments: Husband to A prn for all basic ADLs. Made  pt aware insurance does not pay for tub equipment and that it would be cheaper for her to get it on her own.     Vision Patient Visual Report: No change from baseline              Pertinent Vitals/Pain Pain Assessment: Faces Faces Pain Scale: Hurts little more Pain Location: LUE (R shoulder still numb) Pain Descriptors / Indicators: Sore Pain Intervention(s): Monitored during session;Repositioned     Hand Dominance Right   Extremity/Trunk Assessment Upper Extremity Assessment Upper Extremity Assessment: RUE deficits/detail;Defer to OT evaluation RUE Deficits / Details: s/p reverse TSA, elbow with minimal movement, can pronate--but not supinate, has full AROM for wrist and digits. Block has not totally worn off RUE Coordination: decreased gross motor   Lower Extremity Assessment Lower Extremity Assessment: RLE deficits/detail;LLE deficits/detail RLE Deficits / Details: Functionally >3/5 throughout RLE Sensation: history of peripheral neuropathy LLE Deficits / Details: Functionally >3/5 throughout LLE Sensation: history of peripheral neuropathy       Communication Communication Communication: No difficulties   Cognition Arousal/Alertness: Awake/alert Behavior During Therapy: WFL for tasks assessed/performed Overall Cognitive Status: Within Functional Limits for tasks assessed  General Comments  Husband present and supportive    Exercises Other Exercises Other Exercises: Pt and husband instructed in elbow, forearm, wrist, and hand exercises with pt return demonstrating. Also had pt do PROM in supine for now for FF, ABD, EXT rotation due to pt's block not being worn off. Did show pt how to do them in sitting as well.   Shoulder Instructions Shoulder Instructions Donning/doffing shirt without moving shoulder: Patient able to independently direct caregiver;Caregiver independent with task Method for sponge bathing under  operated UE: Patient able to independently direct caregiver;Caregiver independent with task Donning/doffing sling/immobilizer: Patient able to independently direct caregiver;Caregiver independent with task Correct positioning of sling/immobilizer: Patient able to independently direct caregiver;Caregiver independent with task Pendulum exercises (written home exercise program): (NA) ROM for elbow, wrist and digits of operated UE: Patient able to independently direct caregiver;Caregiver independent with task(until arm is fully awake then she can do on her own) Proper positioning of operated UE when showering: (verbalized understanding and has a mesh sling to shower in as well per her report) Dressing change: (NA) Positioning of UE while sleeping: Caregiver independent with task;Patient able to independently direct caregiver    Home Living Family/patient expects to be discharged to:: Private residence Living Arrangements: Spouse/significant other Available Help at Discharge: Family;Available 24 hours/day Type of Home: House Home Access: Ramped entrance;Stairs to enter Entrance Stairs-Number of Steps: 1 Entrance Stairs-Rails: Right;Left Home Layout: One level     Bathroom Shower/Tub: Occupational psychologist: Standard     Home Equipment: Environmental consultant - 2 wheels;Cane - single point;Grab bars - toilet;Grab bars - tub/shower;Hand held shower head;Bedside commode   Additional Comments: Husband has outfitted house with rails as needed. Only thing pt needs is a shower chair that will fit in small space      Prior Functioning/Environment Level of Independence: Needs assistance  Gait / Transfers Assistance Needed: Indep with ambulation; recently successfully treated by vestibular PT for vertigo ADL's / Homemaking Assistance Needed: Husband assists with posterior pericare, assists with bathing while pt stands in shower; HHA as needed for balance            OT Problem List: Decreased  strength;Decreased range of motion;Impaired balance (sitting and/or standing);Obesity;Impaired UE functional use;Pain         OT Goals(Current goals can be found in the care plan section) Acute Rehab OT Goals Patient Stated Goal: to go home today  OT Frequency:                AM-PAC OT "6 Clicks" Daily Activity     Outcome Measure Help from another person eating meals?: A Little Help from another person taking care of personal grooming?: A Lot Help from another person toileting, which includes using toliet, bedpan, or urinal?: A Lot Help from another person bathing (including washing, rinsing, drying)?: A Lot Help from another person to put on and taking off regular upper body clothing?: A Lot Help from another person to put on and taking off regular lower body clothing?: Total 6 Click Score: 12   End of Session    Activity Tolerance: Patient tolerated treatment well Patient left: in chair;with call bell/phone within reach  OT Visit Diagnosis: Unsteadiness on feet (R26.81);Pain;Muscle weakness (generalized) (M62.81) Pain - Right/Left: Left Pain - part of body: Shoulder                Time: 1638-4536 OT Time Calculation (min): 34 min Charges:  OT General Charges $OT Visit: 1 Visit  OT Evaluation $OT Eval Moderate Complexity: 1 Mod OT Treatments $Therapeutic Exercise: 8-22 mins  Golden Circle, OTR/L Acute [employer] Pager [phone] Office [phone]    Almon Register 09/08/2018, 1:03 PM

## 2018-09-08 NOTE — Anesthesia Postprocedure Evaluation (Signed)
Anesthesia Post Note  Patient: [name] [name]  Procedure(s) Performed: RIGHT REVERSE SHOULDER ARTHROPLASTY (Right Shoulder)     Patient location during evaluation: PACU Anesthesia Type: General Level of consciousness: awake and alert and oriented Pain management: pain level controlled Vital Signs Assessment: post-procedure vital signs reviewed and stable Respiratory status: spontaneous breathing, nonlabored ventilation and respiratory function stable Cardiovascular status: blood pressure returned to baseline Postop Assessment: no apparent nausea or vomiting Anesthetic complications: no                  Brennan Bailey

## 2018-09-08 NOTE — Progress Notes (Signed)
  Subjective: Patient stable.  Pain medicine is working   Objective: Vital signs in last 24 hours: Temp:  [97.5 F (36.4 C)-98.5 F (36.9 C)] 98.1 F (36.7 C) (08/07 0831) Pulse Rate:  [73-105] 73 (08/07 0831) Resp:  [16-28] 20 (08/07 0500) BP: (93-167)/(51-93) 93/51 (08/07 0831) SpO2:  [85 %-98 %] 90 % (08/07 0831)  Intake/Output from previous day: 08/06 0701 - 08/07 0700 In: 2223.4 [I.V.:1773.4; IV Piggyback:450] Out: 3838 [Urine:1300; Blood:250] Intake/Output this shift: Total I/O In: 240 [P.O.:240] Out: -   Exam:  Intact pulses distally No cellulitis present Compartment soft  Labs: No results for input(s): HGB in the last 72 hours. No results for input(s): WBC, RBC, HCT, PLT in the last 72 hours. No results for input(s): NA, K, CL, CO2, BUN, CREATININE, GLUCOSE, CALCIUM in the last 72 hours. No results for input(s): LABPT, INR in the last 72 hours.  Assessment/Plan: Plan at this time is to discharge to home.  Patient has a brace that she will use 1 hour 3 times a day.  Follow-up with me next week.   [name] [name] [name] 09/08/2018, 11:44 AM

## 2018-09-08 NOTE — Telephone Encounter (Signed)
Received call from [place] with [facility] stating patient is suppose to be discharged this afternoon. Rod Holler said patient has not taken anything for diabetes since she has been in the hospital. Rod Holler advised patient's blood sugar is 201 this morning. Rod Holler asked if Dr dean want to give an order for Lantus 28? The number to contact Rod Holler is [phone]

## 2018-09-10 DIAGNOSIS — I10 Essential (primary) hypertension: Secondary | ICD-10-CM | POA: Diagnosis not present

## 2018-09-10 DIAGNOSIS — F419 Anxiety disorder, unspecified: Secondary | ICD-10-CM | POA: Diagnosis not present

## 2018-09-10 DIAGNOSIS — Z794 Long term (current) use of insulin: Secondary | ICD-10-CM | POA: Diagnosis not present

## 2018-09-10 DIAGNOSIS — M109 Gout, unspecified: Secondary | ICD-10-CM | POA: Diagnosis not present

## 2018-09-10 DIAGNOSIS — Z471 Aftercare following joint replacement surgery: Secondary | ICD-10-CM | POA: Diagnosis not present

## 2018-09-10 DIAGNOSIS — G4730 Sleep apnea, unspecified: Secondary | ICD-10-CM | POA: Diagnosis not present

## 2018-09-10 DIAGNOSIS — E1140 Type 2 diabetes mellitus with diabetic neuropathy, unspecified: Secondary | ICD-10-CM | POA: Diagnosis not present

## 2018-09-10 DIAGNOSIS — Z96611 Presence of right artificial shoulder joint: Secondary | ICD-10-CM | POA: Diagnosis not present

## 2018-09-10 DIAGNOSIS — Z853 Personal history of malignant neoplasm of breast: Secondary | ICD-10-CM | POA: Diagnosis not present

## 2018-09-10 DIAGNOSIS — G43909 Migraine, unspecified, not intractable, without status migrainosus: Secondary | ICD-10-CM | POA: Diagnosis not present

## 2018-09-11 ENCOUNTER — Encounter (HOSPITAL_COMMUNITY): Payer: Self-pay | Admitting: Orthopedic Surgery

## 2018-09-13 DIAGNOSIS — Z471 Aftercare following joint replacement surgery: Secondary | ICD-10-CM | POA: Diagnosis not present

## 2018-09-13 DIAGNOSIS — G4730 Sleep apnea, unspecified: Secondary | ICD-10-CM | POA: Diagnosis not present

## 2018-09-13 DIAGNOSIS — M109 Gout, unspecified: Secondary | ICD-10-CM | POA: Diagnosis not present

## 2018-09-13 DIAGNOSIS — F419 Anxiety disorder, unspecified: Secondary | ICD-10-CM | POA: Diagnosis not present

## 2018-09-13 DIAGNOSIS — E1140 Type 2 diabetes mellitus with diabetic neuropathy, unspecified: Secondary | ICD-10-CM | POA: Diagnosis not present

## 2018-09-13 DIAGNOSIS — I10 Essential (primary) hypertension: Secondary | ICD-10-CM | POA: Diagnosis not present

## 2018-09-19 ENCOUNTER — Other Ambulatory Visit: Payer: Self-pay | Admitting: Orthopedic Surgery

## 2018-09-19 DIAGNOSIS — M109 Gout, unspecified: Secondary | ICD-10-CM | POA: Diagnosis not present

## 2018-09-19 DIAGNOSIS — F419 Anxiety disorder, unspecified: Secondary | ICD-10-CM | POA: Diagnosis not present

## 2018-09-19 DIAGNOSIS — Z471 Aftercare following joint replacement surgery: Secondary | ICD-10-CM | POA: Diagnosis not present

## 2018-09-19 DIAGNOSIS — I10 Essential (primary) hypertension: Secondary | ICD-10-CM | POA: Diagnosis not present

## 2018-09-19 DIAGNOSIS — G4730 Sleep apnea, unspecified: Secondary | ICD-10-CM | POA: Diagnosis not present

## 2018-09-19 DIAGNOSIS — E1140 Type 2 diabetes mellitus with diabetic neuropathy, unspecified: Secondary | ICD-10-CM | POA: Diagnosis not present

## 2018-09-19 NOTE — Telephone Encounter (Signed)
Please advise 

## 2018-09-19 NOTE — Telephone Encounter (Signed)
Sent to pharmacy 

## 2018-09-19 NOTE — Telephone Encounter (Signed)
Ok to rf 1 po q 8 prn # 30

## 2018-09-20 DIAGNOSIS — G4730 Sleep apnea, unspecified: Secondary | ICD-10-CM | POA: Diagnosis not present

## 2018-09-20 DIAGNOSIS — F419 Anxiety disorder, unspecified: Secondary | ICD-10-CM | POA: Diagnosis not present

## 2018-09-20 DIAGNOSIS — I10 Essential (primary) hypertension: Secondary | ICD-10-CM | POA: Diagnosis not present

## 2018-09-20 DIAGNOSIS — M109 Gout, unspecified: Secondary | ICD-10-CM | POA: Diagnosis not present

## 2018-09-20 DIAGNOSIS — Z471 Aftercare following joint replacement surgery: Secondary | ICD-10-CM | POA: Diagnosis not present

## 2018-09-20 DIAGNOSIS — E1140 Type 2 diabetes mellitus with diabetic neuropathy, unspecified: Secondary | ICD-10-CM | POA: Diagnosis not present

## 2018-09-20 NOTE — Discharge Summary (Signed)
Physician Discharge Summary      Patient ID: Aritza Brunet Stickler MRN: [id number] DOB/AGE: Jan 27, 1951 68 y.o.  Admit date: 09/07/2018 Discharge date: 09/20/2018  Admission Diagnoses:  Active Problems:   Arthritis of shoulder   Discharge Diagnoses:  Same  Surgeries: Procedure(s): RIGHT REVERSE SHOULDER ARTHROPLASTY on 09/07/2018   Consultants:   Discharged Condition: Stable  Hospital Course: Wilene Pharo Eskew is an 68 y.o. female who was admitted 09/07/2018 with a chief complaint of right shoulder pain, and found to have a diagnosis of right shoulder arthritis.  They were brought to the operating room on 09/07/2018 and underwent the above named procedures.  Patient awoke from anesthesia without complication.  Patient was seen on the floor on postop day 1 and doing well.  She was subsequently discharged home on postop day 1 with plans to follow-up with Dr. Marlou Sa in clinic in 1 to 2 weeks.  Antibiotics given:  Anti-infectives (From admission, onward)   Start     Dose/Rate Route Frequency Ordered Stop   09/07/18 1815  ceFAZolin (ANCEF) IVPB 2g/100 mL premix     2 g 200 mL/hr over 30 Minutes Intravenous Every 6 hours 09/07/18 1813 09/08/18 0617   09/07/18 1424  vancomycin (VANCOCIN) powder  Status:  Discontinued       As needed 09/07/18 1424 09/07/18 1645   09/07/18 0600  ceFAZolin (ANCEF) 3 g in dextrose 5 % 50 mL IVPB     3 g 100 mL/hr over 30 Minutes Intravenous On call to O.R. 09/06/18 0092 09/07/18 1304    .  Recent vital signs:  Vitals:   09/08/18 0831 09/08/18 1251  BP: (!) 93/51 108/62  Pulse: 73 79  Resp:    Temp: 98.1 F (36.7 C) 98.2 F (36.8 C)  SpO2: 90% 99%    Recent laboratory studies:  Results for orders placed or performed during the hospital encounter of 09/07/18  Glucose, capillary  Result Value Ref Range   Glucose-Capillary 173 (H) 70 - 99 mg/dL   Comment 1 Notify RN    Comment 2 Document in Chart   Glucose, capillary  Result Value Ref Range   Glucose-Capillary  138 (H) 70 - 99 mg/dL   Comment 1 Notify RN    Comment 2 Document in Chart   Glucose, capillary  Result Value Ref Range   Glucose-Capillary 214 (H) 70 - 99 mg/dL  Glucose, capillary  Result Value Ref Range   Glucose-Capillary 153 (H) 70 - 99 mg/dL  Glucose, capillary  Result Value Ref Range   Glucose-Capillary 201 (H) 70 - 99 mg/dL  Glucose, capillary  Result Value Ref Range   Glucose-Capillary 181 (H) 70 - 99 mg/dL    Discharge Medications:   Allergies as of 09/08/2018      Reactions   Codeine Swelling   Influenza A (h1n1) Monoval Pf Anaphylaxis   Jardiance [empagliflozin] Other (See Comments)   dehydration      Medication List    STOP taking these medications   acetaminophen-codeine 300-30 MG tablet Commonly known as: TYLENOL #3   furosemide 20 MG tablet Commonly known as: LASIX   Krill Oil 300 MG Caps     TAKE these medications   acetaminophen 325 MG tablet Commonly known as: TYLENOL Take 650 mg by mouth every 6 (six) hours as needed for mild pain.   anastrozole 1 MG tablet Commonly known as: ARIMIDEX TAKE 1 TABLET BY MOUTH EVERY DAY   citalopram 40 MG tablet Commonly known as: CELEXA Take 40  mg by mouth daily.   Farxiga 5 MG Tabs tablet Generic drug: dapagliflozin propanediol Take 5 mg by mouth daily.   fluticasone 50 MCG/ACT nasal spray Commonly known as: FLONASE USE 1 SPRAY IN EACH NOSTRIL TWICE A DAY FOR 7 DAYS   gabapentin 400 MG capsule Commonly known as: NEURONTIN Take 400-800 mg by mouth See admin instructions. Takes one capsule (400mg ) in the morning, AND afternoon. And two capsules (800mg ) at bedtime What changed: Another medication with the same name was removed. Continue taking this medication, and follow the directions you see here.   Lantus 100 UNIT/ML injection Generic drug: insulin glargine Inject 18 Units into the skin at bedtime.   levothyroxine 125 MCG tablet Commonly known as: SYNTHROID Take 1 tablet (125 mcg total) by mouth  daily.   losartan 25 MG tablet Commonly known as: COZAAR Take 1 tablet (25 mg total) by mouth 2 (two) times daily.   multivitamin with minerals tablet Take 1 tablet by mouth daily.   oxyCODONE 5 MG immediate release tablet Commonly known as: Oxy IR/ROXICODONE Take 1-2 tablets (5-10 mg total) by mouth every 4 (four) hours as needed for moderate pain (pain score 4-6).   Tart Cherry Advanced Caps Take 1 capsule by mouth daily.   Ubrogepant 50 MG Tabs Commonly known as: Ubrelvy Take 50 mg by mouth as needed ([place] repeat in 2 hours, for acute migraine headache.).   Victoza 18 MG/3ML Sopn Generic drug: liraglutide Inject 1.8 mg into the skin daily.       Diagnostic Studies: Dg Shoulder Right Port  Result Date: 09/07/2018 CLINICAL DATA:  Status post right shoulder replacement. EXAM: PORTABLE RIGHT SHOULDER COMPARISON:  None. FINDINGS: Patient is status post right shoulder replacement. Hardware is in good position. IMPRESSION: Right shoulder replacement as above. Electronically Signed   By: Dorise Bullion III M.D   On: 09/07/2018 18:28    Disposition:   Discharge Instructions    Call MD / Call 911   Complete by: As directed    If you experience chest pain or shortness of breath, CALL 911 and be transported to the hospital emergency room.  If you develope a fever above 101 F, pus (white drainage) or increased drainage or redness at the wound, or calf pain, call your surgeon's office.   Constipation Prevention   Complete by: As directed    Drink plenty of fluids.  Prune juice [place] be helpful.  You [place] use a stool softener, such as Colace (over the counter) 100 mg twice a day.  Use MiraLax (over the counter) for constipation as needed.   Diet - low sodium heart healthy   Complete by: As directed    Discharge instructions   Complete by: As directed    No lifting with right arm Use the motion machine 1 hour 3 times a day Okay to shower dressing waterproof Keep arm in sling when not  in machine   Increase activity slowly as tolerated   Complete by: As directed          Signed: Donella Stade 09/20/2018, 8:53 PM

## 2018-09-22 ENCOUNTER — Ambulatory Visit (INDEPENDENT_AMBULATORY_CARE_PROVIDER_SITE_OTHER): Payer: Medicare Other | Admitting: Orthopedic Surgery

## 2018-09-22 ENCOUNTER — Ambulatory Visit (INDEPENDENT_AMBULATORY_CARE_PROVIDER_SITE_OTHER): Payer: Medicare Other

## 2018-09-22 ENCOUNTER — Encounter: Payer: Self-pay | Admitting: Orthopedic Surgery

## 2018-09-22 DIAGNOSIS — Z96611 Presence of right artificial shoulder joint: Secondary | ICD-10-CM

## 2018-09-22 MED ORDER — OXYCODONE HCL 5 MG PO TABS: 5.0000 mg | ORAL_TABLET | Freq: Four times a day (QID) | ORAL | 0 refills | Status: DC | PRN

## 2018-09-22 MED ORDER — OXYCODONE HCL 5 MG PO TABS: ORAL_TABLET | ORAL | 0 refills | Status: DC

## 2018-09-24 ENCOUNTER — Encounter: Payer: Self-pay | Admitting: Orthopedic Surgery

## 2018-09-24 NOTE — Progress Notes (Signed)
Post-Op Visit Note   Patient: [name] [name]           Date of Birth: March 08, 1950           MRN: [id number] Visit Date: 09/22/2018 PCP: Leeroy Cha, MD   Assessment & Plan:  Chief Complaint:  Chief Complaint  Patient presents with  . Right Shoulder - Routine Post Op   Visit Diagnoses:  1. Status post reverse total replacement of right shoulder     Plan: Patient is a 68 year old female who presents s/p right shoulder reverse shoulder arthroplasty on 09/07/2018.  Patient is doing well and has been remaining in her sling.  She has been using the CPM machine as instructed and is up to 75 degrees.  She has run out of oxycodone and is taking Tylenol for pain.  Today in the clinic her incision is healing well and axillary nerve is intact on exam.  She has a good endpoint with external rotation and some stiffness of the right shoulder that is normal postoperatively.  X-rays of the right shoulder reveal a well-positioned and fixed prosthesis without any evidence of fracture, dislocation, or hardware loosening.  Patient Trevathan discontinue her sling with a 5 pound lifting restriction of the right arm.  Patient will begin outpatient physical therapy to work on active and passive range of motion.  She will follow-up in 4 weeks with the office.  Follow-Up Instructions: No follow-ups on file.   Orders:  Orders Placed This Encounter  Procedures  . XR Shoulder Right   Meds ordered this encounter  Medications  . oxyCODONE (OXY IR/ROXICODONE) 5 MG immediate release tablet    Sig: Take 1 tablet (5 mg total) by mouth every 6 (six) hours as needed for moderate pain (pain score 4-6).    Dispense:  30 tablet    Refill:  0  . oxyCODONE (OXY IR/ROXICODONE) 5 MG immediate release tablet    Sig: 1 po q 6-8hrs prn pain    Dispense:  30 tablet    Refill:  0    Imaging: No results found.  PMFS History: Patient Active Problem List   Diagnosis Date Noted  . Arthritis of shoulder 09/07/2018  .  Type 2 diabetes mellitus with diabetic polyneuropathy, with long-term current use of insulin ([place]) 11/16/2016  . Anxiety and depression 09/01/2016  . Bilateral lower extremity edema 09/01/2016  . Hypothyroidism 09/01/2016  . Diabetic polyneuropathy associated with type 2 diabetes mellitus ([place]) 06/03/2016  . Achilles tendon contracture, bilateral 06/03/2016  . Malignant neoplasm of upper-outer quadrant of left breast in female, estrogen receptor positive ([place]) 08/19/2015  . Discharge from left nipple 08/03/2015  . Abnormal mammogram of left breast 08/03/2015  . Osteoarthritis of right knee 02/28/2015  . Status post total right knee replacement 02/28/2015  . Low TSH level 08/02/2014  . Splenomegaly 07/30/2014  . Thrombocytopenia ([place]) 07/30/2014  . 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 Recurrent falls 01/12/2013  . Neuropathy, diabetic ([place]) 01/12/2013  . Morbid obesity ([place]) 01/12/2013  . Degenerative arthritis of spine 01/12/2013  . Knee pain 01/12/2013  . Cough 09/05/2012  . HBP (high blood pressure) 09/05/2012  . Chest wall contusion 12/09/2011  . Abdominal wall contusion 12/09/2011   Past Medical History:  Diagnosis Date  . Anxiety   . Arthritis   . Blood dyscrasia    low platelet count- sees [name] , [name] ( Note in [facility] from 07/2014) at [facility]  . Breast cancer [facility]) 2017  Left Breast  . Cancer ([place])    left breast  . Cirrhosis of liver ([place])   . Cough   . Diabetes mellitus without complication ([place])   . Diabetic neuropathy ([place])   . Gout   . Gout   . History of radiation therapy 10/22/15 - 12/08/15   left breast 50.4 Gy, boost to 10 Gy  . Hypertension   . Migraine   . 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 Neuromuscular disorder ([place])    diabetic neuropathy  . Neuropathy   . Personal history of radiation therapy 2017   Left Breast Cancer  . Sleep apnea    CPAP nightly  . Spleen enlarged   . Thyroid disease     Family  History  Problem Relation Age of Onset  . Hypothyroidism Mother   . Bone cancer Paternal Grandmother   . Heart failure Paternal Grandmother   . Cancer Maternal Grandmother   . Heart failure Maternal Grandfather   . Cancer Sister        GYN cancer  . Hypothyroidism Sister     Past Surgical History:  Procedure Laterality Date  . BREAST BIOPSY Left 08/04/2015   Procedure: LEFT BREAST BIOPSY WITH NEEDLE LOCALIZATION;  Surgeon: [name], MD;  Location: [place];  Service: General;  Laterality: Left;  . BREAST DUCTAL SYSTEM EXCISION Left 08/04/2015   Procedure: LEFT EXCISION DUCTAL SYSTEM BREAST;  Surgeon: [name], MD;  Location: [place];  Service: General;  Laterality: Left;  . BREAST LUMPECTOMY Left 08/2015  . BREAST LUMPECTOMY WITH AXILLARY LYMPH NODE BIOPSY Left 09/12/2015   Procedure: RE-EXCISION OF LEFT BREAST LUMPECTOMY WITH LEFT AXILLARY LYMPH NODE BIOPSY;  Surgeon: [name], MD;  Location: [place];  Service: General;  Laterality: Left;  . CHOLECYSTECTOMY    . JOINT REPLACEMENT     left knee  . JOINT REPLACEMENT  2017   right knee  . REVERSE SHOULDER ARTHROPLASTY Right 09/07/2018   Procedure: RIGHT REVERSE SHOULDER ARTHROPLASTY;  Surgeon: Meredith Pel, MD;  Location: [place];  Service: Orthopedics;  Laterality: Right;  . TOTAL KNEE ARTHROPLASTY Right 02/28/2015   Procedure: RIGHT TOTAL KNEE ARTHROPLASTY;  Surgeon: [name], MD;  Location: [facility];  Service: Orthopedics;  Laterality: Right;  . TUBAL LIGATION     Social History   Occupational History  . Occupation: Retired    Comment: [employer]  Tobacco Use  . Smoking status: Never Smoker  . Smokeless tobacco: Never Used  Substance and Sexual Activity  . Alcohol use: No  . Drug use: No  . Sexual activity: Not on file

## 2018-10-16 ENCOUNTER — Ambulatory Visit: Payer: Medicare Other | Admitting: Internal Medicine

## 2018-10-16 DIAGNOSIS — M25511 Pain in right shoulder: Secondary | ICD-10-CM | POA: Diagnosis not present

## 2018-10-16 NOTE — Progress Notes (Deleted)
Name: [name] [name]  Age/ Sex: 68 y.o., female   MRN/ DOB: [id number], 1950/02/03     PCP: Leeroy Cha, MD   Reason for Endocrinology Evaluation: Type 2 Diabetes Mellitus  Initial Endocrine Consultative Visit: 03/20/2018    PATIENT IDENTIFIER: [name] is a 68 y.o. female with a past medical history of T2DM, hypothyroidism, HTN and Hx of breast Ca . The patient has followed with Endocrinology clinic since 03/20/2018 for consultative assistance with management of her diabetes.  DIABETIC HISTORY:  [name] was diagnosed with T2DM many years ago, she is intolerant to Metformin. She was started on Jardiance January, 2020 but due to dizziness this was stopped in February, 2020.She was continued on Victoza and Lantus at the time.  Her hemoglobin A1c has ranged from 6.6% in 2017, peaking at 8.5%  In 2020  Farxiga started 08/2018  Hypothyroidism: She was diagnosed with hypothyroidism many years ago. She has been on LT-4 replacement , last TSH was within normal range 0.41 uIU/mL. She is on 135 mcg daily   SUBJECTIVE:   During the last visit (08/21/2018): We increased Lantus to 28 units and continued victoza at 1.8   Today (10/16/2018): [name] is here for a 3 months  follow up on diabetes management.  She checks her blood sugars 2 times daily, preprandial to breakfast and bedtime. The patient has not had hypoglycemic episodes since the last clinic visit. The patient admits to snacking at times and reduced ability to exercise due to joint aches and pain. Otherwise, the patient has not required any recent emergency interventions for hypoglycemia and has not had recent hospitalizations secondary to hyper or hypoglycemic episodes.  Has been diagnosed with Benign positional vertigo.     ROS: As per HPI and as detailed below: Review of Systems  Constitutional: Negative for fever and weight loss.  Eyes:  Negative for pain.  Respiratory: Negative for cough and shortness of breath.   Cardiovascular: Negative for chest pain and palpitations.  Gastrointestinal: Negative for diarrhea and nausea.  Genitourinary: Negative for frequency.  Skin: Negative.   Neurological: Negative for dizziness.  Endo/Heme/Allergies: Negative for polydipsia.      HOME DIABETES REGIMEN:   Victoza 1.8 mg daily  Lantus 28 units daily  Farxiga 5 mg daily     GLUCOSE LOG:  BG range >150 mg/dL      HISTORY:  Past Medical History:  Past Medical History:  Diagnosis Date  . Anxiety   . Arthritis   . Blood dyscrasia    low platelet count- sees [name] , [name] ( Note in [place] from 07/2014) at [facility]  . Breast cancer [facility]) 2017   Left Breast  . Cancer ([place])    left breast  . Cirrhosis of liver ([place])   . Cough   . Diabetes mellitus without complication ([place])   . Diabetic neuropathy ([place])   . Gout   . Gout   . History of radiation therapy 10/22/15 - 12/08/15   left breast 50.4 Gy, boost to 10 Gy  . Hypertension   . Migraine   . 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 Neuromuscular disorder ([place])    diabetic neuropathy  . Neuropathy   . Personal history of radiation therapy 2017   Left Breast Cancer  . Sleep apnea    CPAP nightly  . Spleen enlarged   . Thyroid disease    Past Surgical History:  Past Surgical History:  Procedure Laterality Date  .  BREAST BIOPSY Left 08/04/2015   Procedure: LEFT BREAST BIOPSY WITH NEEDLE LOCALIZATION;  Surgeon: [name], MD;  Location: [place];  Service: General;  Laterality: Left;  . BREAST DUCTAL SYSTEM EXCISION Left 08/04/2015   Procedure: LEFT EXCISION DUCTAL SYSTEM BREAST;  Surgeon: [name], MD;  Location: [place];  Service: General;  Laterality: Left;  . BREAST LUMPECTOMY Left 08/2015  . BREAST LUMPECTOMY WITH AXILLARY LYMPH NODE BIOPSY Left 09/12/2015   Procedure: RE-EXCISION OF LEFT  BREAST LUMPECTOMY WITH LEFT AXILLARY LYMPH NODE BIOPSY;  Surgeon: [name], MD;  Location: [place];  Service: General;  Laterality: Left;  . CHOLECYSTECTOMY    . JOINT REPLACEMENT     left knee  . JOINT REPLACEMENT  2017   right knee  . REVERSE SHOULDER ARTHROPLASTY Right 09/07/2018   Procedure: RIGHT REVERSE SHOULDER ARTHROPLASTY;  Surgeon: Meredith Pel, MD;  Location: [place];  Service: Orthopedics;  Laterality: Right;  . TOTAL KNEE ARTHROPLASTY Right 02/28/2015   Procedure: RIGHT TOTAL KNEE ARTHROPLASTY;  Surgeon: [name], MD;  [facility];  Service: Orthopedics;  Laterality: Right;  . TUBAL LIGATION      Social History:  reports that she has never smoked. She has never used smokeless tobacco. She reports that she does not drink alcohol or use drugs. Family History:  Family History  Problem Relation Age of Onset  . Hypothyroidism Mother   . Bone cancer Paternal Grandmother   . Heart failure Paternal Grandmother   . Cancer Maternal Grandmother   . Heart failure Maternal Grandfather   . Cancer Sister        GYN cancer  . Hypothyroidism Sister      HOME MEDICATIONS: Allergies as of 10/16/2018      Reactions   Codeine Swelling   Influenza A (h1n1) Monoval Pf Anaphylaxis   Jardiance [empagliflozin] Other (See Comments)   dehydration      Medication List       Accurate as of October 16, 2018 12:43 PM. If you have any questions, ask your nurse or doctor.        acetaminophen 325 MG tablet Commonly known as: TYLENOL Take 650 mg by mouth every 6 (six) hours as needed for mild pain.   anastrozole 1 MG tablet Commonly known as: ARIMIDEX TAKE 1 TABLET BY MOUTH EVERY DAY   citalopram 40 MG tablet Commonly known as: CELEXA Take 40 mg by mouth daily.   Farxiga 5 MG Tabs tablet Generic drug: dapagliflozin propanediol Take 5 mg by mouth daily.   fluticasone 50 MCG/ACT nasal spray Commonly known as: FLONASE USE 1 SPRAY IN EACH  NOSTRIL TWICE A DAY FOR 7 DAYS   gabapentin 400 MG capsule Commonly known as: NEURONTIN Take 400-800 mg by mouth See admin instructions. Takes one capsule (400mg ) in the morning, AND afternoon. And two capsules (800mg ) at bedtime   Lantus 100 UNIT/ML injection Generic drug: insulin glargine Inject 18 Units into the skin at bedtime.   levothyroxine 125 MCG tablet Commonly known as: SYNTHROID Take 1 tablet (125 mcg total) by mouth daily.   losartan 25 MG tablet Commonly known as: COZAAR Take 1 tablet (25 mg total) by mouth 2 (two) times daily.   methocarbamol 500 MG tablet Commonly known as: ROBAXIN Take 1 tablet (500 mg total) by mouth every 8 (eight) hours as needed for muscle spasms.   multivitamin with minerals tablet Take 1 tablet by mouth daily.   oxyCODONE 5  MG immediate release tablet Commonly known as: Oxy IR/ROXICODONE Take 1 tablet (5 mg total) by mouth every 6 (six) hours as needed for moderate pain (pain score 4-6).   oxyCODONE 5 MG immediate release tablet Commonly known as: Oxy IR/ROXICODONE 1 po q 6-8hrs prn pain   Tart Cherry Advanced Caps Take 1 capsule by mouth daily.   Ubrogepant 50 MG Tabs Commonly known as: Ubrelvy Take 50 mg by mouth as needed ([place] repeat in 2 hours, for acute migraine headache.).   Victoza 18 MG/3ML Sopn Generic drug: liraglutide Inject 1.8 mg into the skin daily.        OBJECTIVE:   Vital Signs: There were no vitals taken for this visit.  Wt Readings from Last 3 Encounters:  09/07/18 272 lb (123.4 kg)  08/30/18 273 lb 7 oz (124 kg)  08/21/18 276 lb 12.8 oz (125.6 kg)     Exam: General: Pt appears well and is in NAD  Lungs: Clear with good BS bilat with no rales, rhonchi, or wheezes  Heart: RRR with normal S1 and S2 and no gallops; no murmurs; no rub  Extremities: No pretibial edema. No tremor.   Skin: Normal texture and temperature to palpation. No rash noted. No Acanthosis nigricans/skin tags. No lipohypertrophy.   Neuro: MS is good with appropriate affect, pt is alert and Ox3    DM foot exam:04/03/2018 The skin of the feet is intact without sores or ulcerations. The pedal pulses are 2+ on right and 2+ on left. The sensation is absent to a screening 5.07, 10 gram monofilament bilaterally     DATA REVIEWED:  Lab Results  Component Value Date   HGBA1C 7.8 (A) 08/21/2018   HGBA1C 7.1 (A) 05/15/2018   HGBA1C 6.6 (H) 02/21/2015   Lab Results  Component Value Date   CREATININE 0.92 08/30/2018       03/03/2018 A1c 8.5 %  04/26/2018  TSH 0.630 uIU/mL    ASSESSMENT / PLAN / RECOMMENDATIONS:   1) Type 2 Diabetes Mellitus, Poorly controlled, With neuropathic complications - Most recent A1c of 7.8 %. Goal A1c < 7.0 %.    - Unfortunately her glucose control has trended up, this is due to lack of activity , diatary indiscretions and weight gain  - Encouraged to work on  lifestyle changes, we discussed the limited options for  - She was on Jardiance at some point but due to dizziness at the time we stopped it, she has been off of it for many months but her dizziness has persisted , pt with a new diagnosis of benign positional vertigo  - Will star he ron Farxiga , discussed increased risk of genital hydration.     MEDICATIONS:  Continue Lantus 28 units daily  Continue Victoza at 1.8 mg daily  Start Farxiga at 5 mg daily   EDUCATION / INSTRUCTIONS:  BG monitoring instructions: Patient is instructed to check her blood sugars 2 times a day, fasting and bedtime.  Call [facility] if: BG persistently < 70 or > 300. . I reviewed the Rule of 15 for the treatment of hypoglycemia in detail with the patient. Literature supplied.  2)Hypothyroidism :  -Patient is clinically euthyroid -She denies any local neck symptoms -Pt educated extensively on the correct way to take levothyroxine (first thing in the morning with water, 30 minutes before eating or taking other medications). -  Pt encouraged to double dose the following day if she were to miss a dose given long half-life of  levothyroxine. - TSh is high today, will increase LT-4 replacement therapy as below   Medications Increase   Levothyroxine to 125 mcg daily   F/U in 2 months    Signed electronically by: Mack Guise, MD  [facility]  [facility] [address] [place], [place] [address] Phone: [phone] FAX: [phone]   CC: Leeroy Cha, MD [address] [place] [address] Phone: [phone]  Fax: [phone]  Return to Endocrinology clinic as below: Future Appointments  Date Time Provider [place]  10/16/2018  1:20 PM Shamleffer, Melanie Crazier, MD [facility] None  10/20/2018  9:30 AM Meredith Pel, MD [facility] None  10/31/2018  3:00 PM Ward Givens, NP [facility] None  11/07/2018 11:00 AM [facility] 5 [facility] None  11/07/2018 11:30 AM Magrinat, Virgie Dad, MD [facility] None

## 2018-10-17 ENCOUNTER — Other Ambulatory Visit: Payer: Self-pay | Admitting: Oncology

## 2018-10-17 DIAGNOSIS — Z9889 Other specified postprocedural states: Secondary | ICD-10-CM

## 2018-10-20 ENCOUNTER — Ambulatory Visit (INDEPENDENT_AMBULATORY_CARE_PROVIDER_SITE_OTHER): Payer: Medicare Other | Admitting: Orthopedic Surgery

## 2018-10-20 ENCOUNTER — Other Ambulatory Visit: Payer: Self-pay

## 2018-10-20 ENCOUNTER — Encounter: Payer: Self-pay | Admitting: Orthopedic Surgery

## 2018-10-20 DIAGNOSIS — Z96611 Presence of right artificial shoulder joint: Secondary | ICD-10-CM

## 2018-10-20 MED ORDER — METHOCARBAMOL 500 MG PO TABS: 500.0000 mg | ORAL_TABLET | Freq: Three times a day (TID) | ORAL | 0 refills | Status: DC | PRN

## 2018-10-20 NOTE — Progress Notes (Signed)
Post-Op Visit Note   Patient: [name] [name]           Date of Birth: 12-16-1950           MRN: [id number] Visit Date: 10/20/2018 PCP: Leeroy Cha, MD   Assessment & Plan:  Chief Complaint:  Chief Complaint  Patient presents with  . Right Shoulder - Follow-up   Visit Diagnoses: No diagnosis found.  Plan: Patient is a 68 year old female who presents s/p right reverse shoulder arthroplasty on 09/07/2018.  Patient states she is doing well and she is going to physical therapy 2 times a week.  She has physical therapy scheduled through mid October.  She is also following up with exercises at home and continues to use a CPM machine 3 times a day.  She is up to 90 degrees on the CPM machine..  Her range of motion has improved significantly.  She is not taking oxycodone for pain anymore only taking the occasional Tylenol and Robaxin.  On exam she has good motion and strength particular with the subscap.  Her incision is healing well.  She denies any fevers, chills, malaise, discharge.  She will follow-up in 6 weeks for final recheck.  She also notes a left great toe ulcer that developed a couple days ago.  She is diabetic and has a history of neuropathy.  The ulcer is not infected at this time there is no surrounding cellulitis and no expressible drainage.  Recommended that she check it every single day and if there is no significant improvement or any worsening to follow-up with Dr. Sharol Given.  Patient understands and agrees.  Follow-Up Instructions: No follow-ups on file.   Orders:  No orders of the defined types were placed in this encounter.  No orders of the defined types were placed in this encounter.   Imaging: No results found.  PMFS History: Patient Active Problem List   Diagnosis Date Noted  . Arthritis of shoulder 09/07/2018  . Type 2 diabetes mellitus with diabetic polyneuropathy, with long-term current use of insulin ([place]) 11/16/2016  . Anxiety and depression 09/01/2016   . Bilateral lower extremity edema 09/01/2016  . Hypothyroidism 09/01/2016  . Diabetic polyneuropathy associated with type 2 diabetes mellitus ([place]) 06/03/2016  . Achilles tendon contracture, bilateral 06/03/2016  . Malignant neoplasm of upper-outer quadrant of left breast in female, estrogen receptor positive ([place]) 08/19/2015  . Discharge from left nipple 08/03/2015  . Abnormal mammogram of left breast 08/03/2015  . Osteoarthritis of right knee 02/28/2015  . Status post total right knee replacement 02/28/2015  . Low TSH level 08/02/2014  . Splenomegaly 07/30/2014  . Thrombocytopenia ([place]) 07/30/2014  . 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 Recurrent falls 01/12/2013  . Neuropathy, diabetic ([place]) 01/12/2013  . Morbid obesity ([place]) 01/12/2013  . Degenerative arthritis of spine 01/12/2013  . Knee pain 01/12/2013  . Cough 09/05/2012  . HBP (high blood pressure) 09/05/2012  . Chest wall contusion 12/09/2011  . Abdominal wall contusion 12/09/2011   Past Medical History:  Diagnosis Date  . Anxiety   . Arthritis   . Blood dyscrasia    low platelet count- sees [name] , [name] ( Note in [place] from 07/2014) at [facility]  . Breast cancer [facility]) 2017   Left Breast  . Cancer ([place])    left breast  . Cirrhosis of liver ([place])   . Cough   . Diabetes mellitus without complication ([place])   . Diabetic neuropathy ([place])   . Gout   .  Gout   . History of radiation therapy 10/22/15 - 12/08/15   left breast 50.4 Gy, boost to 10 Gy  . Hypertension   . Migraine   . 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 Neuromuscular disorder ([place])    diabetic neuropathy  . Neuropathy   . Personal history of radiation therapy 2017   Left Breast Cancer  . Sleep apnea    CPAP nightly  . Spleen enlarged   . Thyroid disease     Family History  Problem Relation Age of Onset  . Hypothyroidism Mother   . Bone cancer Paternal Grandmother   . Heart failure Paternal Grandmother    . Cancer Maternal Grandmother   . Heart failure Maternal Grandfather   . Cancer Sister        GYN cancer  . Hypothyroidism Sister     Past Surgical History:  Procedure Laterality Date  . BREAST BIOPSY Left 08/04/2015   Procedure: LEFT BREAST BIOPSY WITH NEEDLE LOCALIZATION;  Surgeon: [name], MD;  Location: [place];  Service: General;  Laterality: Left;  . BREAST DUCTAL SYSTEM EXCISION Left 08/04/2015   Procedure: LEFT EXCISION DUCTAL SYSTEM BREAST;  Surgeon: [name], MD;  Location: [place];  Service: General;  Laterality: Left;  . BREAST LUMPECTOMY Left 08/2015  . BREAST LUMPECTOMY WITH AXILLARY LYMPH NODE BIOPSY Left 09/12/2015   Procedure: RE-EXCISION OF LEFT BREAST LUMPECTOMY WITH LEFT AXILLARY LYMPH NODE BIOPSY;  Surgeon: [name], MD;  Location: [place];  Service: General;  Laterality: Left;  . CHOLECYSTECTOMY    . JOINT REPLACEMENT     left knee  . JOINT REPLACEMENT  2017   right knee  . REVERSE SHOULDER ARTHROPLASTY Right 09/07/2018   Procedure: RIGHT REVERSE SHOULDER ARTHROPLASTY;  Surgeon: Meredith Pel, MD;  Location: [place];  Service: Orthopedics;  Laterality: Right;  . TOTAL KNEE ARTHROPLASTY Right 02/28/2015   Procedure: RIGHT TOTAL KNEE ARTHROPLASTY;  Surgeon: [name], MD;  Location: [facility];  Service: Orthopedics;  Laterality: Right;  . TUBAL LIGATION     Social History   Occupational History  . Occupation: Retired    Comment: [employer]  Tobacco Use  . Smoking status: Never Smoker  . Smokeless tobacco: Never Used  Substance and Sexual Activity  . Alcohol use: No  . Drug use: No  . Sexual activity: Not on file

## 2018-10-24 DIAGNOSIS — M25511 Pain in right shoulder: Secondary | ICD-10-CM | POA: Diagnosis not present

## 2018-10-26 DIAGNOSIS — M25511 Pain in right shoulder: Secondary | ICD-10-CM | POA: Diagnosis not present

## 2018-10-27 ENCOUNTER — Ambulatory Visit (INDEPENDENT_AMBULATORY_CARE_PROVIDER_SITE_OTHER): Payer: Medicare Other | Admitting: Family

## 2018-10-27 ENCOUNTER — Encounter: Payer: Self-pay | Admitting: Family

## 2018-10-27 VITALS — Ht 61.0 in | Wt 272.0 lb

## 2018-10-27 DIAGNOSIS — L97521 Non-pressure chronic ulcer of other part of left foot limited to breakdown of skin: Secondary | ICD-10-CM

## 2018-10-27 MED ORDER — CEPHALEXIN 500 MG PO CAPS: 500.0000 mg | ORAL_CAPSULE | Freq: Three times a day (TID) | ORAL | 0 refills | Status: DC

## 2018-10-27 NOTE — Progress Notes (Signed)
Office Visit Note   Patient: [name] [name]           Date of Birth: 09-08-1950           MRN: [id number] Visit Date: 10/27/2018              Requested by: Leeroy Cha, MD [address] [place],  [place] [address] PCP: Leeroy Cha, MD  Chief Complaint  Patient presents with  . Left Foot - Pain      HPI: The patient is a 68 year old woman who is seen today for evaluation of a blister ulceration to the great toe of her left foot.  Has been having trouble getting this to heal its been reopening.  She has been applying Neosporin dressing and has been having issues with pain and bloody drainage changing her dressing every day.  Wonders if she needs an antibiotic.  Dr. Marlou Sa has referred to [place] for red streaking.  Assessment & Plan: Visit Diagnoses: No diagnosis found.  Plan: placed prisma dressing today.  Given the rest of the material to the patient.  She will change this daily.  After Dial soap cleansing.  She was given a postop shoe to offload her forefoot.  We will place her on a short course of Keflex.  Follow-Up Instructions: No follow-ups on file.   Ortho Exam  Patient is alert, oriented, no adenopathy, well-dressed, normal affect, normal respiratory effort. On examination of the left lower extremity there is no swelling to her foot no discoloration.  Does have a strong dorsalis pedis pulse.  She has a ulcer to the plantar aspect of the great toe this is 8 mm in length 2 mm open there is granulation tissue with 1 mm deep.  There is no surrounding maceration there is some slight erythema to the periwound area this extends over the dorsum of her great toe.  No warmth does have a palpable dorsalis pedis pulse.  Imaging: No results found. No images are attached to the encounter.  Labs: Lab Results  Component Value Date   HGBA1C 7.8 (A) 08/21/2018   HGBA1C 7.1 (A) 05/15/2018   HGBA1C 6.6 (H) 02/21/2015   REPTSTATUS 08/31/2018 FINAL 08/30/2018   CULT (A) 08/30/2018    <10,000 COLONIES/mL INSIGNIFICANT GROWTH Performed at [facility], [place] [address], [place], [place] [address]      Lab Results  Component Value Date   ALBUMIN 4.6 07/05/2018   ALBUMIN 4.1 03/18/2018   ALBUMIN 4.0 10/11/2017    No results found for: MG No results found for: VD25OH  No results found for: PREALBUMIN CBC EXTENDED Latest Ref Rng & Units 08/30/2018 03/18/2018 10/11/2017  WBC 4.0 - 10.5 K/uL 5.9 4.0 3.5(L)  RBC 3.87 - 5.11 MIL/uL 4.47 4.52 4.14  HGB 12.0 - 15.0 g/dL 13.2 12.7 11.7  HCT 36.0 - 46.0 % 41.0 40.6 36.4  PLT 150 - 400 K/uL 99(L) 86(L) 84(L)  NEUTROABS 1.7 - 7.7 K/uL - 2.5 2.4  LYMPHSABS 0.7 - 4.0 K/uL - 1.1 0.7(L)     Body mass index is 51.39 kg/m.  Orders:  No orders of the defined types were placed in this encounter.  No orders of the defined types were placed in this encounter.    Procedures: No procedures performed  Clinical Data: No additional findings.  ROS:  All other systems negative, except as noted in the HPI. Review of Systems  Constitutional: Negative for chills and fever.  Skin: Positive for color change  and wound.    Objective: Vital Signs: Ht 5\' 1"  (1.549 m)   Wt 272 lb (123.4 kg)   BMI 51.39 kg/m   Specialty Comments:  No specialty comments available.  PMFS History: Patient Active Problem List   Diagnosis Date Noted  . Arthritis of shoulder 09/07/2018  . Type 2 diabetes mellitus with diabetic polyneuropathy, with long-term current use of insulin ([place]) 11/16/2016  . Anxiety and depression 09/01/2016  . Bilateral lower extremity edema 09/01/2016  . Hypothyroidism 09/01/2016  . Diabetic polyneuropathy associated with type 2 diabetes mellitus ([place]) 06/03/2016  . Achilles tendon contracture, bilateral 06/03/2016  . Malignant neoplasm of upper-outer quadrant of left breast in female, estrogen receptor positive ([place]) 08/19/2015  . Discharge from left nipple 08/03/2015  . Abnormal  mammogram of left breast 08/03/2015  . Osteoarthritis of right knee 02/28/2015  . Status post total right knee replacement 02/28/2015  . Low TSH level 08/02/2014  . Splenomegaly 07/30/2014  . Thrombocytopenia ([place]) 07/30/2014  . 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 Recurrent falls 01/12/2013  . Neuropathy, diabetic ([place]) 01/12/2013  . Morbid obesity ([place]) 01/12/2013  . Degenerative arthritis of spine 01/12/2013  . Knee pain 01/12/2013  . Cough 09/05/2012  . HBP (high blood pressure) 09/05/2012  . Chest wall contusion 12/09/2011  . Abdominal wall contusion 12/09/2011   Past Medical History:  Diagnosis Date  . Anxiety   . Arthritis   . Blood dyscrasia    low platelet count- sees [name] , [name] ( Note in [place] from 07/2014) at [facility]  . Breast cancer [facility]) 2017   Left Breast  . Cancer ([place])    left breast  . Cirrhosis of liver ([place])   . Cough   . Diabetes mellitus without complication ([place])   . Diabetic neuropathy ([place])   . Gout   . Gout   . History of radiation therapy 10/22/15 - 12/08/15   left breast 50.4 Gy, boost to 10 Gy  . Hypertension   . Migraine   . 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 Neuromuscular disorder ([place])    diabetic neuropathy  . Neuropathy   . Personal history of radiation therapy 2017   Left Breast Cancer  . Sleep apnea    CPAP nightly  . Spleen enlarged   . Thyroid disease     Family History  Problem Relation Age of Onset  . Hypothyroidism Mother   . Bone cancer Paternal Grandmother   . Heart failure Paternal Grandmother   . Cancer Maternal Grandmother   . Heart failure Maternal Grandfather   . Cancer Sister        GYN cancer  . Hypothyroidism Sister     Past Surgical History:  Procedure Laterality Date  . BREAST BIOPSY Left 08/04/2015   Procedure: LEFT BREAST BIOPSY WITH NEEDLE LOCALIZATION;  Surgeon: [name], MD;  Location: [place];  Service: General;  Laterality: Left;  .  BREAST DUCTAL SYSTEM EXCISION Left 08/04/2015   Procedure: LEFT EXCISION DUCTAL SYSTEM BREAST;  Surgeon: [name], MD;  Location: [place];  Service: General;  Laterality: Left;  . BREAST LUMPECTOMY Left 08/2015  . BREAST LUMPECTOMY WITH AXILLARY LYMPH NODE BIOPSY Left 09/12/2015   Procedure: RE-EXCISION OF LEFT BREAST LUMPECTOMY WITH LEFT AXILLARY LYMPH NODE BIOPSY;  Surgeon: [name], MD;  Location: [place];  Service: General;  Laterality: Left;  . CHOLECYSTECTOMY    . JOINT REPLACEMENT     left knee  .  JOINT REPLACEMENT  2017   right knee  . REVERSE SHOULDER ARTHROPLASTY Right 09/07/2018   Procedure: RIGHT REVERSE SHOULDER ARTHROPLASTY;  Surgeon: Meredith Pel, MD;  Location: [place];  Service: Orthopedics;  Laterality: Right;  . TOTAL KNEE ARTHROPLASTY Right 02/28/2015   Procedure: RIGHT TOTAL KNEE ARTHROPLASTY;  Surgeon: [name], MD;  Location: [facility];  Service: Orthopedics;  Laterality: Right;  . TUBAL LIGATION     Social History   Occupational History  . Occupation: Retired    Comment: [employer]  Tobacco Use  . Smoking status: Never Smoker  . Smokeless tobacco: Never Used  Substance and Sexual Activity  . Alcohol use: No  . Drug use: No  . Sexual activity: Not on file

## 2018-10-30 DIAGNOSIS — M25511 Pain in right shoulder: Secondary | ICD-10-CM | POA: Diagnosis not present

## 2018-10-31 ENCOUNTER — Other Ambulatory Visit: Payer: Self-pay

## 2018-10-31 ENCOUNTER — Ambulatory Visit (INDEPENDENT_AMBULATORY_CARE_PROVIDER_SITE_OTHER): Payer: Medicare Other | Admitting: Adult Health

## 2018-10-31 ENCOUNTER — Encounter: Payer: Self-pay | Admitting: Adult Health

## 2018-10-31 VITALS — BP 154/78 | HR 94 | Temp 97.7°F | Ht 61.0 in | Wt 276.1 lb

## 2018-10-31 DIAGNOSIS — G43109 Migraine with aura, not intractable, without status migrainosus: Secondary | ICD-10-CM

## 2018-10-31 DIAGNOSIS — R2689 Other abnormalities of gait and mobility: Secondary | ICD-10-CM

## 2018-10-31 NOTE — Patient Instructions (Signed)
Your Plan:  Continue to monitor symptoms If your symptoms worsen or you develop new symptoms please let us know.   Thank you for coming to see us at [facility]. I hope we have been able to provide you high quality care today.  You [place] receive a patient satisfaction survey over the next few weeks. We would appreciate your feedback and comments so that we [place] continue to improve ourselves and the health of our patients.   

## 2018-10-31 NOTE — Progress Notes (Signed)
PATIENT: [name] [name] DOB: 08-Mar-1950  REASON FOR VISIT: follow up HISTORY FROM: patient  HISTORY OF PRESENT ILLNESS: Today 10/31/18:  [name] [name] is a 68 year old female with a history of multifactorial gait disorder, vertigo and migraine headaches.  She returns today for an evaluation.  She states that with vestibular rehab vertigo has resolved.  She states that she is not had any additional episodes.  Her migraines have also resolved.  She states that she is only had to taking probably 3 times and that is when she was still having vertigo.  She recently had surgery on her right shoulder.  She continues to have difficulty with her gait.  Fortunately she is not had any falls.  She returns today for evaluation.  HISTORY [name] is a 68 year old right-handed woman with an underlying complex medical history of diabetes with associated neuropathy, arthritis, status post knee replacement surgery, breast cancer with status post lumpectomy and XRT, diverticulitis, hypothyroidism, hypertension, gout, migraine headaches, sleep apnea, and morbid obesity with a BMI of over 50, who reports recurrent vertiginous symptoms since February 2020.  She says that she Fiebig have intermittent good days but she has almost daily symptoms of spinning sensation.  She has had intermittent headaches as well, headaches are primarily right-sided.  She has a history of migraines in the distant past when her children were little.  She has associated nausea, some vomiting.  She has been using Zofran as needed for her nausea.  She has tried meclizine for her vertigo which has not helped.  She has been trying exercises at home for her vertigo which are somewhat helpful.  She has a history of sleep apnea and reports using her CPAP regularly.  She has gained weight.  She reports joint pain particularly her right shoulder and reports that she needs a shoulder replacement.  She is scheduled for a CT of the shoulder as I understand.  She also  reports foot pain.  She has arthritis and gout.  She has had a headache about once or twice a week.  She reports intermittent drawing sensation in the right face, this is not a new problem, she has had this sensation since she had shingles affecting the right side of her face some years ago.  I reviewed your office records including office note from 05/02/2018.  Of note, she presented to the emergency room in February 2020 with right-sided headache.  I reviewed the emergency room records.  She was treated symptomatically with a migraine cocktail.  She had a CT angio head and neck and I reviewed the results from 03/18/2018: IMPRESSION: Negative study. Minimal atherosclerotic change. No stenosis, irregularity or dissection. No intracranial large or medium vessel occlusion.  I have previously seen her for gait disorder which I felt was multifactorial.  She had a brain MRI without contrast on 01/30/2013: IMPRESSION:  Equivocal MRI brain (without) demonstrating: 1. There are 3 punctate, left hemisphere foci of subcortical gliosis. These findings are non-specific and considerations include autoimmune, inflammatory, post-infectious, microvascular ischemic or migraine associated etiologies.  2. No acute findings.  REVIEW OF SYSTEMS: Out of a complete 14 system review of symptoms, the patient complains only of the following symptoms, and all other reviewed systems are negative.  See HPI  ALLERGIES: Allergies  Allergen Reactions   Codeine Swelling   Influenza A (H1n1) Monoval Pf Anaphylaxis   Jardiance [Empagliflozin] Other (See Comments)    dehydration    HOME MEDICATIONS: Outpatient Medications Prior to Visit  Medication Sig Dispense Refill   acetaminophen (TYLENOL) 325 MG tablet Take 650 mg by mouth every 6 (six) hours as needed for mild pain.      anastrozole (ARIMIDEX) 1 MG tablet TAKE 1 TABLET BY MOUTH EVERY DAY (Patient taking differently: Take 1 mg by mouth daily. ) 90 tablet 4    cephALEXin (KEFLEX) 500 MG capsule Take 1 capsule (500 mg total) by mouth 3 (three) times daily. 30 capsule 0   citalopram (CELEXA) 40 MG tablet Take 40 mg by mouth daily.     dapagliflozin propanediol (FARXIGA) 5 MG TABS tablet Take 5 mg by mouth daily. 30 tablet 2   fluticasone (FLONASE) 50 MCG/ACT nasal spray Place 1 spray into both nostrils as needed.      gabapentin (NEURONTIN) 400 MG capsule Take 400-800 mg by mouth See admin instructions. Takes one capsule (400mg ) in the morning, AND afternoon. And two capsules (800mg ) at bedtime     insulin glargine (LANTUS) 100 UNIT/ML injection Inject 18 Units into the skin at bedtime.      levothyroxine (SYNTHROID) 125 MCG tablet Take 1 tablet (125 mcg total) by mouth daily. 90 tablet 3   Liraglutide (VICTOZA) 18 MG/3ML SOPN Inject 1.8 mg into the skin daily.     losartan (COZAAR) 25 MG tablet Take 1 tablet (25 mg total) by mouth 2 (two) times daily. 180 tablet 2   methocarbamol (ROBAXIN) 500 MG tablet Take 1 tablet (500 mg total) by mouth every 8 (eight) hours as needed for muscle spasms. 30 tablet 0   Misc Natural Products (TART CHERRY ADVANCED) CAPS Take 1 capsule by mouth daily.     Multiple Vitamins-Minerals (MULTIVITAMIN WITH MINERALS) tablet Take 1 tablet by mouth daily.     oxyCODONE (OXY IR/ROXICODONE) 5 MG immediate release tablet Take 1 tablet (5 mg total) by mouth every 6 (six) hours as needed for moderate pain (pain score 4-6). 30 tablet 0   oxyCODONE (OXY IR/ROXICODONE) 5 MG immediate release tablet 1 po q 6-8hrs prn pain 30 tablet 0   Ubrogepant (UBRELVY) 50 MG TABS Take 50 mg by mouth as needed (Jeanbaptiste repeat in 2 hours, for acute migraine headache.). 20 tablet 3   No facility-administered medications prior to visit.     PAST MEDICAL HISTORY: Past Medical History:  Diagnosis Date   Anxiety    Arthritis    Blood dyscrasia    low platelet count- sees [name] , [name] ( Note in EPIC from 07/2014) at  [facility]) 2017   Left Breast   Cancer [facility])    left breast   Cirrhosis of liver ([place])    Cough    Diabetes mellitus without complication ([place])    Diabetic neuropathy ([place])    Gout    Gout    History of radiation therapy 10/22/15 - 12/08/15   left breast 50.4 Gy, boost to 10 Gy   Hypertension    Migraine    Multifactorial gait disorder 01/12/2013   Neuromuscular disorder ([place])    diabetic neuropathy   Neuropathy    Personal history of radiation therapy 2017   Left Breast Cancer   Sleep apnea    CPAP nightly   Spleen enlarged    Thyroid disease     PAST SURGICAL HISTORY: Past Surgical History:  Procedure Laterality Date   BREAST BIOPSY Left 08/04/2015   Procedure: LEFT BREAST BIOPSY WITH NEEDLE LOCALIZATION;  Surgeon: [name], MD;  Location: [facility];  Service: General;  Laterality: Left;   BREAST DUCTAL SYSTEM EXCISION Left 08/04/2015   Procedure: LEFT EXCISION DUCTAL SYSTEM BREAST;  Surgeon: [name], MD;  Location: [facility];  Service: General;  Laterality: Left;   BREAST LUMPECTOMY Left 08/2015   BREAST LUMPECTOMY WITH AXILLARY LYMPH NODE BIOPSY Left 09/12/2015   Procedure: RE-EXCISION OF LEFT BREAST LUMPECTOMY WITH LEFT AXILLARY LYMPH NODE BIOPSY;  Surgeon: [name], MD;  Location: [facility];  Service: General;  Laterality: Left;   CHOLECYSTECTOMY     JOINT REPLACEMENT     left knee   JOINT REPLACEMENT  2017   right knee   REVERSE SHOULDER ARTHROPLASTY Right 09/07/2018   Procedure: RIGHT REVERSE SHOULDER ARTHROPLASTY;  Surgeon: Meredith Pel, MD;  Location: [facility];  Service: Orthopedics;  Laterality: Right;   TOTAL KNEE ARTHROPLASTY Right 02/28/2015   Procedure: RIGHT TOTAL KNEE ARTHROPLASTY;  Surgeon: [name], MD;  Location: [facility];  Service: Orthopedics;  Laterality: Right;   TUBAL LIGATION      FAMILY HISTORY: Family History  Problem  Relation Age of Onset   Hypothyroidism Mother    Bone cancer Paternal Grandmother    Heart failure Paternal Grandmother    Cancer Maternal Grandmother    Heart failure Maternal Grandfather    Cancer Sister        GYN cancer   Hypothyroidism Sister     SOCIAL HISTORY: Social History   Socioeconomic History   Marital status: Married    Spouse name: Not on file   Number of children: 3   Years of education: Not on file   Highest education level: Not on file  Occupational History   Occupation: Retired    Comment: Microbiologist strain: Not on file   Food insecurity    Worry: Not on file    Inability: Not on Lexicographer needs    Medical: Not on file    Non-medical: Not on file  Tobacco Use   Smoking status: Never Smoker   Smokeless tobacco: Never Used  Substance and Sexual Activity   Alcohol use: No   Drug use: No   Sexual activity: Not on file  Lifestyle   Physical activity    Days per week: Not on file    Minutes per session: Not on file   Stress: Not on file  Relationships   Social connections    Talks on phone: Not on file    Gets together: Not on file    Attends religious service: Not on file    Active member of club or organization: Not on file    Attends meetings of clubs or organizations: Not on file    Relationship status: Not on file   Intimate partner violence    Fear of current or ex partner: Not on file    Emotionally abused: Not on file    Physically abused: Not on file    Forced sexual activity: Not on file  Other Topics Concern   Not on file  Social History Narrative   Not on file      PHYSICAL EXAM  Vitals:   10/31/18 1509  BP: (!) 154/78  Pulse: 94  Temp: 97.7 F (36.5 C)  TempSrc: Oral  Weight: 276 lb 1.6 oz (125.2 kg)  Height: 5\' 1"  (1.549 m)   Body mass index is 52.17 kg/m.  Generalized: Well developed, in no acute distress  Neurological  examination  Mentation: Alert oriented to time, place, history taking. Follows all commands speech and language fluent Cranial nerve II-XII: Pupils were equal round reactive to light. Extraocular movements were full, visual field were full on confrontational test. Head turning and shoulder shrug  were normal and symmetric. Motor: The motor testing reveals 5 over 5 strength of all 4 extremities. Good symmetric motor tone is noted throughout.  Sensory: Sensory testing is intact to soft touch on all 4 extremities. No evidence of extinction is noted.  Coordination: Cerebellar testing reveals good finger-nose-finger and heel-to-shin bilaterally.  Gait and station: Patient's gait is unsteady.  She uses her husband's arm when ambulating.  DIAGNOSTIC DATA (LABS, IMAGING, TESTING) - I reviewed patient records, labs, notes, testing and imaging myself where available.  Lab Results  Component Value Date   WBC 5.9 08/30/2018   HGB 13.2 08/30/2018   HCT 41.0 08/30/2018   MCV 91.7 08/30/2018   PLT 99 (L) 08/30/2018      Component Value Date/Time   NA 138 08/30/2018 1420   NA 138 07/05/2018 1635   NA 142 10/06/2016 1211   K 4.0 08/30/2018 1420   K 3.8 10/06/2016 1211   CL 105 08/30/2018 1420   CO2 24 08/30/2018 1420   CO2 25 10/06/2016 1211   GLUCOSE 146 (H) 08/30/2018 1420   GLUCOSE 179 (H) 10/06/2016 1211   BUN 16 08/30/2018 1420   BUN 19 07/05/2018 1635   BUN 16.9 10/06/2016 1211   CREATININE 0.92 08/30/2018 1420   CREATININE 1.09 (H) 09/28/2017 1516   CREATININE 1.1 10/06/2016 1211   CALCIUM 10.3 08/30/2018 1420   CALCIUM 10.4 10/06/2016 1211   PROT 7.5 07/05/2018 1635   PROT 7.6 10/06/2016 1211   ALBUMIN 4.6 07/05/2018 1635   ALBUMIN 3.9 10/06/2016 1211   AST 33 07/05/2018 1635   AST 33 10/06/2016 1211   ALT 17 07/05/2018 1635   ALT 20 10/06/2016 1211   ALKPHOS 83 07/05/2018 1635   ALKPHOS 86 10/06/2016 1211   BILITOT 0.6 07/05/2018 1635   BILITOT 0.52 10/06/2016 1211    GFRNONAA >60 08/30/2018 1420   GFRNONAA 52 (L) 09/28/2017 1516   GFRAA >60 08/30/2018 1420   GFRAA 61 09/28/2017 1516   No results found for: CHOL, HDL, LDLCALC, LDLDIRECT, TRIG, CHOLHDL Lab Results  Component Value Date   HGBA1C 7.8 (A) 08/21/2018   Lab Results  Component Value Date   VITAMINB12 250 08/01/2014   Lab Results  Component Value Date   TSH 5.87 (H) 08/21/2018      ASSESSMENT AND PLAN 68 y.o. year old female  has a past medical history of Anxiety, Arthritis, Blood dyscrasia, Breast cancer ([place]) (2017), Cancer ([place]), Cirrhosis of liver ([place]), Cough, Diabetes mellitus without complication ([place]), Diabetic neuropathy ([place]), Gout, Gout, History of radiation therapy (10/22/15 - 12/08/15), Hypertension, Migraine, Multifactorial gait disorder (01/12/2013), Neuromuscular disorder ([place]), Neuropathy, Personal history of radiation therapy (2017), Sleep apnea, Spleen enlarged, and Thyroid disease. here with:  1.  Migraine headaches 2.  Vertigo 3.  Multifactorial gait disorder  Overall the patient is doing well.  Vertigo has resolved.  Fortunately she is also not had any migraine headaches.  She is encouraged to be cautious with her gait using her cane when ambulating.  She still has ubrelvy if needed.  I have advised that since she has remained stable she can continue follow-up with her primary care provider.  She will follow-up with our office on an as-needed basis.  I spent 15 minutes with the patient. 50% of this time was spent    Ward Givens, MSN, NP-C 10/31/2018, 3:09 PM [facility] [address] [place], [place] [address] [phone]

## 2018-11-02 DIAGNOSIS — M25511 Pain in right shoulder: Secondary | ICD-10-CM | POA: Diagnosis not present

## 2018-11-06 ENCOUNTER — Other Ambulatory Visit: Payer: Self-pay | Admitting: *Deleted

## 2018-11-06 DIAGNOSIS — M25511 Pain in right shoulder: Secondary | ICD-10-CM | POA: Diagnosis not present

## 2018-11-06 DIAGNOSIS — C50412 Malignant neoplasm of upper-outer quadrant of left female breast: Secondary | ICD-10-CM

## 2018-11-06 DIAGNOSIS — Z170 Estrogen receptor positive status [ER+]: Secondary | ICD-10-CM

## 2018-11-07 ENCOUNTER — Other Ambulatory Visit: Payer: Self-pay

## 2018-11-07 ENCOUNTER — Inpatient Hospital Stay (HOSPITAL_BASED_OUTPATIENT_CLINIC_OR_DEPARTMENT_OTHER): Payer: Medicare Other | Admitting: Oncology

## 2018-11-07 ENCOUNTER — Inpatient Hospital Stay: Payer: Medicare Other | Attending: Oncology

## 2018-11-07 VITALS — BP 141/69 | HR 85 | Temp 98.7°F | Resp 18 | Ht 61.0 in | Wt 277.0 lb

## 2018-11-07 DIAGNOSIS — G4730 Sleep apnea, unspecified: Secondary | ICD-10-CM | POA: Diagnosis not present

## 2018-11-07 DIAGNOSIS — Z79899 Other long term (current) drug therapy: Secondary | ICD-10-CM | POA: Insufficient documentation

## 2018-11-07 DIAGNOSIS — Z79811 Long term (current) use of aromatase inhibitors: Secondary | ICD-10-CM | POA: Insufficient documentation

## 2018-11-07 DIAGNOSIS — C50412 Malignant neoplasm of upper-outer quadrant of left female breast: Secondary | ICD-10-CM | POA: Insufficient documentation

## 2018-11-07 DIAGNOSIS — Z9989 Dependence on other enabling machines and devices: Secondary | ICD-10-CM | POA: Insufficient documentation

## 2018-11-07 DIAGNOSIS — Z170 Estrogen receptor positive status [ER+]: Secondary | ICD-10-CM

## 2018-11-07 DIAGNOSIS — R161 Splenomegaly, not elsewhere classified: Secondary | ICD-10-CM | POA: Diagnosis not present

## 2018-11-07 DIAGNOSIS — I10 Essential (primary) hypertension: Secondary | ICD-10-CM | POA: Diagnosis not present

## 2018-11-07 DIAGNOSIS — K7460 Unspecified cirrhosis of liver: Secondary | ICD-10-CM | POA: Diagnosis not present

## 2018-11-07 DIAGNOSIS — M1990 Unspecified osteoarthritis, unspecified site: Secondary | ICD-10-CM | POA: Diagnosis not present

## 2018-11-07 DIAGNOSIS — K760 Fatty (change of) liver, not elsewhere classified: Secondary | ICD-10-CM | POA: Diagnosis not present

## 2018-11-07 DIAGNOSIS — D696 Thrombocytopenia, unspecified: Secondary | ICD-10-CM | POA: Diagnosis not present

## 2018-11-07 DIAGNOSIS — E1140 Type 2 diabetes mellitus with diabetic neuropathy, unspecified: Secondary | ICD-10-CM | POA: Insufficient documentation

## 2018-11-07 LAB — CMP (CANCER CENTER ONLY)
ALT: 16 U/L (ref 0–44)
AST: 28 U/L (ref 15–41)
Albumin: 3.9 g/dL (ref 3.5–5.0)
Alkaline Phosphatase: 90 U/L (ref 38–126)
Anion gap: 11 (ref 5–15)
BUN: 16 mg/dL (ref 8–23)
CO2: 23 mmol/L (ref 22–32)
Calcium: 10.2 mg/dL (ref 8.9–10.3)
Chloride: 105 mmol/L (ref 98–111)
Creatinine: 0.96 mg/dL (ref 0.44–1.00)
GFR, Est AFR Am: >60 mL/min (ref >60)
GFR, Estimated: >60 mL/min (ref >60)
Glucose, Bld: 208 mg/dL — ABNORMAL HIGH (ref 70–99)
Potassium: 4 mmol/L (ref 3.5–5.1)
Sodium: 139 mmol/L (ref 135–145)
Total Bilirubin: 0.6 mg/dL (ref 0.3–1.2)
Total Protein: 7.7 g/dL (ref 6.5–8.1)

## 2018-11-07 LAB — CBC WITH DIFFERENTIAL (CANCER CENTER ONLY)
Abs Immature Granulocytes: 0.02 10*3/uL (ref 0.00–0.07)
Basophils Absolute: 0 10*3/uL (ref 0.0–0.1)
Basophils Relative: 1 %
Eosinophils Absolute: 0.1 10*3/uL (ref 0.0–0.5)
Eosinophils Relative: 3 %
HCT: 38 % (ref 36.0–46.0)
Hemoglobin: 12 g/dL (ref 12.0–15.0)
Immature Granulocytes: 0 %
Lymphocytes Relative: 21 %
Lymphs Abs: 1 10*3/uL (ref 0.7–4.0)
MCH: 28.6 pg (ref 26.0–34.0)
MCHC: 31.6 g/dL (ref 30.0–36.0)
MCV: 90.7 fL (ref 80.0–100.0)
Monocytes Absolute: 0.3 10*3/uL (ref 0.1–1.0)
Monocytes Relative: 7 %
Neutro Abs: 3.1 10*3/uL (ref 1.7–7.7)
Neutrophils Relative %: 68 %
Platelet Count: 83 10*3/uL — ABNORMAL LOW (ref 150–400)
RBC: 4.19 MIL/uL (ref 3.87–5.11)
RDW: 14.7 % (ref 11.5–15.5)
WBC Count: 4.5 10*3/uL (ref 4.0–10.5)
nRBC: 0 % (ref 0.0–0.2)

## 2018-11-07 MED ORDER — ANASTROZOLE 1 MG PO TABS: 1.0000 mg | ORAL_TABLET | Freq: Every day | ORAL | 4 refills | Status: DC
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
## 2018-11-09 ENCOUNTER — Telehealth: Payer: Self-pay | Admitting: Oncology

## 2018-11-09 DIAGNOSIS — M25511 Pain in right shoulder: Secondary | ICD-10-CM | POA: Diagnosis not present

## 2018-11-09 NOTE — Telephone Encounter (Signed)
I could not reach patient regarding schedule  °

## 2018-11-13 DIAGNOSIS — M25511 Pain in right shoulder: Secondary | ICD-10-CM | POA: Diagnosis not present

## 2018-11-14 ENCOUNTER — Other Ambulatory Visit: Payer: Self-pay | Admitting: Internal Medicine

## 2018-11-16 DIAGNOSIS — M25511 Pain in right shoulder: Secondary | ICD-10-CM | POA: Diagnosis not present

## 2018-11-20 ENCOUNTER — Ambulatory Visit
Admission: RE | Admit: 2018-11-20 | Discharge: 2018-11-20 | Disposition: A | Discharge status: Home / Self Care | Payer: Medicare Other | Source: Ambulatory Visit | Attending: Oncology | Admitting: Oncology

## 2018-11-20 ENCOUNTER — Other Ambulatory Visit: Payer: Self-pay

## 2018-11-20 DIAGNOSIS — Z9889 Other specified postprocedural states: Secondary | ICD-10-CM

## 2018-11-20 DIAGNOSIS — Z853 Personal history of malignant neoplasm of breast: Secondary | ICD-10-CM | POA: Diagnosis not present

## 2018-11-20 DIAGNOSIS — R928 Other abnormal and inconclusive findings on diagnostic imaging of breast: Secondary | ICD-10-CM | POA: Diagnosis not present

## 2018-11-21 DIAGNOSIS — M25511 Pain in right shoulder: Secondary | ICD-10-CM | POA: Diagnosis not present

## 2018-11-21 DIAGNOSIS — M25552 Pain in left hip: Secondary | ICD-10-CM | POA: Diagnosis not present

## 2018-11-23 DIAGNOSIS — M25552 Pain in left hip: Secondary | ICD-10-CM | POA: Diagnosis not present

## 2018-11-23 DIAGNOSIS — M25511 Pain in right shoulder: Secondary | ICD-10-CM | POA: Diagnosis not present

## 2018-11-27 DIAGNOSIS — M25511 Pain in right shoulder: Secondary | ICD-10-CM | POA: Diagnosis not present

## 2018-11-27 DIAGNOSIS — M25552 Pain in left hip: Secondary | ICD-10-CM | POA: Diagnosis not present

## 2018-11-29 DIAGNOSIS — M25552 Pain in left hip: Secondary | ICD-10-CM | POA: Diagnosis not present

## 2018-11-29 DIAGNOSIS — M25511 Pain in right shoulder: Secondary | ICD-10-CM | POA: Diagnosis not present

## 2018-12-01 ENCOUNTER — Other Ambulatory Visit: Payer: Self-pay

## 2018-12-01 ENCOUNTER — Ambulatory Visit (INDEPENDENT_AMBULATORY_CARE_PROVIDER_SITE_OTHER): Payer: Medicare Other | Admitting: Orthopedic Surgery

## 2018-12-01 ENCOUNTER — Encounter: Payer: Self-pay | Admitting: Orthopedic Surgery

## 2018-12-01 ENCOUNTER — Ambulatory Visit (INDEPENDENT_AMBULATORY_CARE_PROVIDER_SITE_OTHER): Payer: Medicare Other

## 2018-12-01 DIAGNOSIS — M25562 Pain in left knee: Secondary | ICD-10-CM | POA: Diagnosis not present

## 2018-12-01 NOTE — Progress Notes (Signed)
Office Visit Note   Patient: [name] [name]           Date of Birth: 02/20/1950           MRN: [id number] Visit Date: 12/01/2018 Requested by: Leeroy Cha, MD [address] [place],  [place] [address] PCP: Leeroy Cha, MD  Subjective: Chief Complaint  Patient presents with  . Follow-up    HPI: [name] [name] is a patient is now about 2 and half months out right reverse shoulder replacement.  She has been doing well with that.  She had a fall this past weekend on her left knee.  This was a hyperflexion type injury.  She has a total knee replacement on that side.  Would like to get that checked out as well.  Overall she is doing well with her shoulder and has essentially completed a course of physical therapy.  Not really taking anything for pain.              ROS: All systems reviewed are negative as they relate to the chief complaint within the history of present illness.  Patient denies  fevers or chills.   Assessment & Plan: Visit Diagnoses:  1. Acute pain of left knee     Plan: Impression is left knee pain hyperflexion injury with good stability to varus valgus stress at 0 and 30 degrees on the left and it is comparable to the right.  She has good range of motion and no effusion in that left knee.  Extensor mechanism is intact.  Radiographs look good.  I think this is likely soft tissue strain injury which should resolve.  Right shoulder doing well.  Discussed lifetime 15 pound lifting limit.  Follow-up with me as needed  Follow-Up Instructions: Return if symptoms worsen or fail to improve.   Orders:  Orders Placed This Encounter  Procedures  . XR Knee 1-2 Views Left   No orders of the defined types were placed in this encounter.     Procedures: No procedures performed   Clinical Data: No additional findings.  Objective: Vital Signs: There were no vitals taken for this visit.  Physical Exam:   Constitutional: Patient appears well-developed  HEENT:  Head: Normocephalic Eyes:EOM are normal Neck: Normal range of motion Cardiovascular: Normal rate Pulmonary/chest: Effort normal Neurologic: Patient is alert Skin: Skin is warm Psychiatric: Patient has normal mood and affect    Ortho Exam: Ortho exam demonstrates forward flexion abduction both above 90 degrees on the right shoulder.  Strength is good external rotation internal rotation.  On that left knee she has no effusion.  Excellent range of motion full extension to about 115 of flexion.  She has a little bit more laxity to valgus stress on the left but it is symmetric to the right-hand side.  No real tenderness over the MCL on the left.  No bruising or ecchymosis in that left knee.  Specialty Comments:  No specialty comments available.  Imaging: Xr Knee 1-2 Views Left  Result Date: 12/01/2018 AP lateral left knee reviewed.  Cemented total knee prosthesis in good position alignment with no complicating features.  No acute fracture.  Patella height normal in relation to the distal femur    PMFS History: Patient Active Problem List   Diagnosis Date Noted  . Hepatic steatosis 11/07/2018  . Arthritis of shoulder 09/07/2018  . Type 2 diabetes mellitus with diabetic polyneuropathy, with long-term current use of insulin ([place]) 11/16/2016  . Anxiety and depression  09/01/2016  . Bilateral lower extremity edema 09/01/2016  . Hypothyroidism 09/01/2016  . Diabetic polyneuropathy associated with type 2 diabetes mellitus ([place]) 06/03/2016  . Achilles tendon contracture, bilateral 06/03/2016  . Malignant neoplasm of upper-outer quadrant of left breast in female, estrogen receptor positive ([place]) 08/19/2015  . Discharge from left nipple 08/03/2015  . Abnormal mammogram of left breast 08/03/2015  . Osteoarthritis of right knee 02/28/2015  . Status post total right knee replacement 02/28/2015  . Low TSH level 08/02/2014  . Splenomegaly 07/30/2014  . Thrombocytopenia ([place]) 07/30/2014   . 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 Recurrent falls 01/12/2013  . Neuropathy, diabetic ([place]) 01/12/2013  . Morbid obesity ([place]) 01/12/2013  . Degenerative arthritis of spine 01/12/2013  . Knee pain 01/12/2013  . Cough 09/05/2012  . HBP (high blood pressure) 09/05/2012  . Chest wall contusion 12/09/2011  . Abdominal wall contusion 12/09/2011   Past Medical History:  Diagnosis Date  . Anxiety   . Arthritis   . Blood dyscrasia    low platelet count- sees [name] , [name] ( Note in [place] from 07/2014) at [facility]  . Breast cancer [facility]) 2017   Left Breast  . Cancer ([place])    left breast  . Cirrhosis of liver ([place])   . Cough   . Diabetes mellitus without complication ([place])   . Diabetic neuropathy ([place])   . Gout   . Gout   . History of radiation therapy 10/22/15 - 12/08/15   left breast 50.4 Gy, boost to 10 Gy  . Hypertension   . Migraine   . 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 Neuromuscular disorder ([place])    diabetic neuropathy  . Neuropathy   . Personal history of radiation therapy 2017   Left Breast Cancer  . Sleep apnea    CPAP nightly  . Spleen enlarged   . Thyroid disease     Family History  Problem Relation Age of Onset  . Hypothyroidism Mother   . Bone cancer Paternal Grandmother   . Heart failure Paternal Grandmother   . Cancer Maternal Grandmother   . Heart failure Maternal Grandfather   . Cancer Sister        GYN cancer  . Hypothyroidism Sister     Past Surgical History:  Procedure Laterality Date  . BREAST BIOPSY Left 08/04/2015   Procedure: LEFT BREAST BIOPSY WITH NEEDLE LOCALIZATION;  Surgeon: [name], MD;  Location: [place];  Service: General;  Laterality: Left;  . BREAST DUCTAL SYSTEM EXCISION Left 08/04/2015   Procedure: LEFT EXCISION DUCTAL SYSTEM BREAST;  Surgeon: [name], MD;  Location: [place];  Service: General;  Laterality: Left;  . BREAST LUMPECTOMY Left 08/2015   . BREAST LUMPECTOMY WITH AXILLARY LYMPH NODE BIOPSY Left 09/12/2015   Procedure: RE-EXCISION OF LEFT BREAST LUMPECTOMY WITH LEFT AXILLARY LYMPH NODE BIOPSY;  Surgeon: [name], MD;  Location: [place];  Service: General;  Laterality: Left;  . CHOLECYSTECTOMY    . JOINT REPLACEMENT     left knee  . JOINT REPLACEMENT  2017   right knee  . REVERSE SHOULDER ARTHROPLASTY Right 09/07/2018   Procedure: RIGHT REVERSE SHOULDER ARTHROPLASTY;  Surgeon: Meredith Pel, MD;  Location: [place];  Service: Orthopedics;  Laterality: Right;  . TOTAL KNEE ARTHROPLASTY Right 02/28/2015   Procedure: RIGHT TOTAL KNEE ARTHROPLASTY;  Surgeon: [name], MD;  [facility];  Service: Orthopedics;  Laterality: Right;  . TUBAL LIGATION  Social History   Occupational History  . Occupation: Retired    Comment: [employer]  Tobacco Use  . Smoking status: Never Smoker  . Smokeless tobacco: Never Used  Substance and Sexual Activity  . Alcohol use: No  . Drug use: No  . Sexual activity: Not on file

## 2019-02-14 DIAGNOSIS — Z6841 Body Mass Index (BMI) 40.0 and over, adult: Secondary | ICD-10-CM | POA: Diagnosis not present

## 2019-02-14 DIAGNOSIS — F5101 Primary insomnia: Secondary | ICD-10-CM | POA: Diagnosis not present

## 2019-02-14 DIAGNOSIS — E1143 Type 2 diabetes mellitus with diabetic autonomic (poly)neuropathy: Secondary | ICD-10-CM | POA: Diagnosis not present

## 2019-02-14 DIAGNOSIS — Z794 Long term (current) use of insulin: Secondary | ICD-10-CM | POA: Diagnosis not present

## 2019-02-14 DIAGNOSIS — F330 Major depressive disorder, recurrent, mild: Secondary | ICD-10-CM | POA: Diagnosis not present

## 2019-02-23 ENCOUNTER — Other Ambulatory Visit: Payer: Self-pay | Admitting: Internal Medicine

## 2019-03-05 NOTE — Progress Notes (Signed)
Office Visit Note  Patient: [name] [name]             Date of Birth: July 22, 1950           MRN: [id number]             PCP: Leeroy Cha, MD Referring: Leeroy Cha,* Visit Date: 03/13/2019 Occupation: @GUAROCC @  Subjective:  Pain in multiple joints.   History of Present Illness: [name] [name] is a 69 y.o. female self-referred herself for evaluation of her arthritis.  Patient states she has had arthritis for many years.  She has had bilateral total knee replacements in the past.  She underwent right total shoulder replacement by Dr. Marlou Sa last year.  She states she has been experiencing discomfort in her left shoulder joint now.  Which pops and cracks at times.  She has been also having grinding sensation in her ankle joints.  The right ankle joint is worse.  She also has discomfort in her feet.  She notices swelling in her feet at the end of the day.  She has had history of gout for many years.  She takes colchicine on as needed basis.  She states she has not had a gout flare in the last 3 years.  She has been monitoring her diet.  Activities of Daily Living:  Patient reports morning stiffness for 5-6 minutes.   Patient Reports nocturnal pain.  Difficulty dressing/grooming: Reports Difficulty climbing stairs: Reports Difficulty getting out of chair: Reports Difficulty using hands for taps, buttons, cutlery, and/or writing: Reports  Review of Systems  Constitutional: Positive for fatigue. Negative for night sweats, weight gain and weight loss.  HENT: Positive for mouth dryness. Negative for mouth sores, trouble swallowing, trouble swallowing and nose dryness.   Eyes: Positive for itching and dryness. Negative for pain, redness and visual disturbance.  Respiratory: Negative for cough, shortness of breath, wheezing and difficulty breathing.   Cardiovascular: Negative for chest pain, palpitations, hypertension, irregular heartbeat and swelling in legs/feet.    Gastrointestinal: Negative for blood in stool, constipation and diarrhea.  Endocrine: Negative for increased urination.  Genitourinary: Negative for difficulty urinating, painful urination and vaginal dryness.  Musculoskeletal: Positive for arthralgias, joint pain and morning stiffness. Negative for joint swelling, myalgias, muscle weakness, muscle tenderness and myalgias.  Skin: Negative for color change, rash, hair loss, skin tightness, ulcers and sensitivity to sunlight.  Allergic/Immunologic: Negative for susceptible to infections.  Neurological: Negative for dizziness, numbness, headaches, memory loss, night sweats and weakness.  Hematological: Negative for bruising/bleeding tendency and swollen glands.  Psychiatric/Behavioral: Positive for depressed mood and sleep disturbance. Negative for confusion. The patient is not nervous/anxious.     PMFS History:  Patient Active Problem List   Diagnosis Date Noted  . Hepatic steatosis 11/07/2018  . Arthritis of shoulder 09/07/2018  . Type 2 diabetes mellitus with diabetic polyneuropathy, with long-term current use of insulin ([place]) 11/16/2016  . Anxiety and depression 09/01/2016  . Bilateral lower extremity edema 09/01/2016  . Hypothyroidism 09/01/2016  . Diabetic polyneuropathy associated with type 2 diabetes mellitus ([place]) 06/03/2016  . Achilles tendon contracture, bilateral 06/03/2016  . Malignant neoplasm of upper-outer quadrant of left breast in female, estrogen receptor positive ([place]) 08/19/2015  . Discharge from left nipple 08/03/2015  . Abnormal mammogram of left breast 08/03/2015  . Osteoarthritis of right knee 02/28/2015  . Status post total right knee replacement 02/28/2015  . Low TSH level 08/02/2014  . Splenomegaly 07/30/2014  . Thrombocytopenia ([place]) 07/30/2014  .  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 Recurrent falls 01/12/2013  . Neuropathy, diabetic ([place]) 01/12/2013  . Morbid obesity ([place]) 01/12/2013  .  Degenerative arthritis of spine 01/12/2013  . Knee pain 01/12/2013  . Cough 09/05/2012  . HBP (high blood pressure) 09/05/2012  . Chest wall contusion 12/09/2011  . Abdominal wall contusion 12/09/2011    Past Medical History:  Diagnosis Date  . Anxiety   . Arthritis   . Blood dyscrasia    low platelet count- sees [name] , [name] ( Note in [place] from 07/2014) at [facility]  . Breast cancer [facility]) 2017   Left Breast  . Cancer ([place])    left breast  . Cirrhosis of liver ([place])   . Cough   . Diabetes mellitus without complication ([place])   . Diabetic neuropathy ([place])   . Gout   . Gout   . History of radiation therapy 10/22/15 - 12/08/15   left breast 50.4 Gy, boost to 10 Gy  . Hypertension   . Migraine   . 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 Neuromuscular disorder ([place])    diabetic neuropathy  . Neuropathy   . Personal history of radiation therapy 2017   Left Breast Cancer  . Sleep apnea    CPAP nightly  . Spleen enlarged   . Thyroid disease     Family History  Problem Relation Age of Onset  . Hypothyroidism Mother   . Diabetes Father   . Bone cancer Paternal Grandmother   . Heart failure Paternal Grandmother   . Cancer Maternal Grandmother   . Heart failure Maternal Grandfather   . Cancer Sister        GYN cancer  . Hypothyroidism Sister    Past Surgical History:  Procedure Laterality Date  . BREAST BIOPSY Left 08/04/2015   Procedure: LEFT BREAST BIOPSY WITH NEEDLE LOCALIZATION;  Surgeon: [name], MD;  Location: [facility];  Service: General;  Laterality: Left;  . BREAST DUCTAL SYSTEM EXCISION Left 08/04/2015   Procedure: LEFT EXCISION DUCTAL SYSTEM BREAST;  Surgeon: [name], MD;  Location: [place];  Service: General;  Laterality: Left;  . BREAST LUMPECTOMY Left 08/2015  . BREAST LUMPECTOMY WITH AXILLARY LYMPH NODE BIOPSY Left 09/12/2015   Procedure: RE-EXCISION OF LEFT BREAST LUMPECTOMY WITH LEFT  AXILLARY LYMPH NODE BIOPSY;  Surgeon: [name], MD;  Location: [facility];  Service: General;  Laterality: Left;  . CHOLECYSTECTOMY    . JOINT REPLACEMENT     left knee  . JOINT REPLACEMENT  2017   right knee  . REVERSE SHOULDER ARTHROPLASTY Right 09/07/2018   Procedure: RIGHT REVERSE SHOULDER ARTHROPLASTY;  Surgeon: Meredith Pel, MD;  Location: [facility];  Service: Orthopedics;  Laterality: Right;  . TOTAL KNEE ARTHROPLASTY Right 02/28/2015   Procedure: RIGHT TOTAL KNEE ARTHROPLASTY;  Surgeon: [name], MD;  Location: [facility];  Service: Orthopedics;  Laterality: Right;  . TUBAL LIGATION     Social History   Social History Narrative  . Not on file    There is no immunization history on file for this patient.   Objective: Vital Signs: BP 126/69 (BP Location: Left Arm, Patient Position: Sitting, Cuff Size: Large)   Pulse 91   Resp 16   Ht 5' 1.5" (1.562 m)   Wt 275 lb (124.7 kg)   BMI 51.12 kg/m    Physical Exam Vitals and nursing note reviewed.  Constitutional:      Appearance: She  is well-developed.  HENT:     Head: Normocephalic and atraumatic.  Eyes:     Conjunctiva/sclera: Conjunctivae normal.  Cardiovascular:     Rate and Rhythm: Normal rate and regular rhythm.     Heart sounds: Normal heart sounds.  Pulmonary:     Effort: Pulmonary effort is normal.     Breath sounds: Normal breath sounds.  Abdominal:     General: Bowel sounds are normal.     Palpations: Abdomen is soft.  Musculoskeletal:     Cervical back: Normal range of motion.  Lymphadenopathy:     Cervical: No cervical adenopathy.  Skin:    General: Skin is warm and dry.     Capillary Refill: Capillary refill takes less than 2 seconds.  Neurological:     Mental Status: She is alert and oriented to person, place, and time.  Psychiatric:        Behavior: Behavior normal.      Musculoskeletal Exam: C-spine was in good range of motion.  History is difficult to assess the  thoracic and lumbar spine range of motion due to morbid obesity.  Right shoulder joint is replaced with some limitation with internal rotation.  Left shoulder joint was in good range of motion with discomfort.  Elbow joints were in good range of motion.  Wrist joints and MCPs with good range of motion with no synovitis.  She has bilateral PIP and DIP thickening.  Hip joints were difficult to assess in the sitting position.  Bilateral knee joints are replaced.  She had discomfort with range of motion of bilateral ankle joint without any warmth swelling or effusion.  PIP and DIP arthritis was noted with no synovitis.  CDAI Exam: CDAI Score: -- Patient Global: --; Provider Global: -- Swollen: --; Tender: -- Joint Exam 03/13/2019   No joint exam has been documented for this visit   There is currently no information documented on the homunculus. Go to the Rheumatology activity and complete the homunculus joint exam.  Investigation: No additional findings.  Imaging: XR Ankle 2 Views Left  Result Date: 03/13/2019 Mild tibiotalar joint space narrowing was noted.  Subtalar collapse was noted.  Dorsal spurring was noted.  Inferior and posterior calcaneal spurs were noted. Impression: These findings are consistent with arthritis of the ankle joint most likely Charcot joint.  XR Ankle 2 Views Right  Result Date: 03/13/2019 Tibiotalar joint space narrowing was noted.  Spurring around the tibia was noted.  Mild subtalar joint space narrowing was noted.  Inferior and posterior calcaneal spurs were noted. Impression: These findings are consistent with arthritis of the ankle joint.  Most likely Charcot joint.  XR Foot 2 Views Left  Result Date: 03/13/2019 First MTP, PIP and DIP narrowing was noted.  No erosive changes were noted.  No intertarsal narrowing was noted.  Dorsal spurring was noted.  Posterior calcification around talus was noted.  Inferior calcaneal spurs were noted. Impression: These findings are  consistent with osteoarthritis of the foot.  XR Foot 2 Views Right  Result Date: 03/13/2019 First MTP, PIP and DIP narrowing was noted.  No erosive changes were noted.  No intertarsal narrowing was noted.  Dorsal spurring was noted.  Posterior calcification around talus was noted.  Inferior calcaneal spurs were noted. Impression: These findings are consistent with osteoarthritis of the foot.  XR Shoulder Left  Result Date: 03/13/2019 No glenohumeral narrowing was noted.  No acromioclavicular narrowing was noted.  Inferior spurring was noted at the humeral head.  Acromial spurring was noted. Impression: Spurring was noted inferiorly at the humeral head and the acromial region.   Recent Labs: Lab Results  Component Value Date   WBC 4.5 11/07/2018   HGB 12.0 11/07/2018   PLT 83 (L) 11/07/2018   NA 139 11/07/2018   K 4.0 11/07/2018   CL 105 11/07/2018   CO2 23 11/07/2018   GLUCOSE 208 (H) 11/07/2018   BUN 16 11/07/2018   CREATININE 0.96 11/07/2018   BILITOT 0.6 11/07/2018   ALKPHOS 90 11/07/2018   AST 28 11/07/2018   ALT 16 11/07/2018   PROT 7.7 11/07/2018   ALBUMIN 3.9 11/07/2018   CALCIUM 10.2 11/07/2018   GFRAA >60 11/07/2018    Speciality Comments: No specialty comments available.  Procedures:  No procedures performed Allergies: Codeine, Influenza a (h1n1) monoval pf, and Jardiance [empagliflozin]   Assessment / Plan:     Visit Diagnoses: Bilateral ankle joint pain-patient complains of pain and popping in her bilateral ankles.  No warmth swelling or effusion was noted..  Bilateral ankle joint space narrowing was noted.  Left tibiotalar joint collapse was noted.  These findings are consistent with Charcot joint.  Pain in both feet-she complains of pain and discomfort in her bilateral feet.  No synovitis was noted over MCPs or PIPs..  The x-ray findings are consistent with osteoarthritis of the feet.  I do not see any component of inflammation.  As patient is complaining of  recurrent joint pain and swelling I will obtain following labs today.  Which will include rheumatoid factor, anti-CCP and uric acid.  Chronic left shoulder pain-she had painful range of motion of her left shoulder joint.  Spurring around the humerus and acromion was noted.  No significant glenohumeral joint space narrowing was noted.  Status post total replacement of right shoulder - September 09, 2018 by Dr. Marlou Sa  Status post total knee replacement, bilateral - Left total knee replacement 2013  History of gout-patient reports that she has had gout for many years.  The last episode was more than 3 years ago.  She takes colchicine on as needed basis.  Other medical problems are listed as follows:  Type 2 diabetes mellitus with diabetic polyneuropathy, with long-term current use of insulin ([place])  Diabetic polyneuropathy associated with type 2 diabetes mellitus ([facility])  Hepatic steatosis  History of cirrhosis of liver  Essential hypertension  Splenomegaly  Thrombocytopenia ([facility])-she has chronic thrombocytopenia and 80s.  Malignant neoplasm of upper-outer quadrant of left breast in female, estrogen receptor positive ([place])  Anxiety and depression  Multifactorial gait disorder  History of hypothyroidism  Orders: Orders Placed This Encounter  Procedures  . XR Shoulder Left  . XR Ankle 2 Views Right  . XR Ankle 2 Views Left  . XR Foot 2 Views Right  . XR Foot 2 Views Left  . Rheumatoid factor  . Cyclic citrul peptide antibody, IgG  . Uric acid   No orders of the defined types were placed in this encounter.   Face-to-face time spent with patient was 60 minutes. Greater than 50% of time was spent in counseling and coordination of care.  Follow-Up Instructions: Return for Osteoarthritis.   Bo Merino, MD  Note - This record has been created using Editor, commissioning.  Chart creation errors have been sought, but [facility] not always  have been located. Such creation errors do not  reflect on  the standard of medical care.

## 2019-03-13 ENCOUNTER — Ambulatory Visit: Payer: Self-pay

## 2019-03-13 ENCOUNTER — Other Ambulatory Visit: Payer: Self-pay

## 2019-03-13 ENCOUNTER — Ambulatory Visit (INDEPENDENT_AMBULATORY_CARE_PROVIDER_SITE_OTHER): Payer: Medicare Other | Admitting: Rheumatology

## 2019-03-13 ENCOUNTER — Ambulatory Visit (INDEPENDENT_AMBULATORY_CARE_PROVIDER_SITE_OTHER): Payer: Medicare Other

## 2019-03-13 ENCOUNTER — Other Ambulatory Visit: Payer: Self-pay | Admitting: Internal Medicine

## 2019-03-13 ENCOUNTER — Encounter: Payer: Self-pay | Admitting: Rheumatology

## 2019-03-13 VITALS — BP 126/69 | HR 91 | Resp 16 | Ht 61.5 in | Wt 275.0 lb

## 2019-03-13 DIAGNOSIS — M25512 Pain in left shoulder: Secondary | ICD-10-CM

## 2019-03-13 DIAGNOSIS — R2689 Other abnormalities of gait and mobility: Secondary | ICD-10-CM

## 2019-03-13 DIAGNOSIS — Z96653 Presence of artificial knee joint, bilateral: Secondary | ICD-10-CM

## 2019-03-13 DIAGNOSIS — E1142 Type 2 diabetes mellitus with diabetic polyneuropathy: Secondary | ICD-10-CM | POA: Diagnosis not present

## 2019-03-13 DIAGNOSIS — R161 Splenomegaly, not elsewhere classified: Secondary | ICD-10-CM | POA: Diagnosis not present

## 2019-03-13 DIAGNOSIS — F419 Anxiety disorder, unspecified: Secondary | ICD-10-CM

## 2019-03-13 DIAGNOSIS — Z8739 Personal history of other diseases of the musculoskeletal system and connective tissue: Secondary | ICD-10-CM | POA: Diagnosis not present

## 2019-03-13 DIAGNOSIS — I10 Essential (primary) hypertension: Secondary | ICD-10-CM

## 2019-03-13 DIAGNOSIS — Z8639 Personal history of other endocrine, nutritional and metabolic disease: Secondary | ICD-10-CM

## 2019-03-13 DIAGNOSIS — Z170 Estrogen receptor positive status [ER+]: Secondary | ICD-10-CM

## 2019-03-13 DIAGNOSIS — Z8719 Personal history of other diseases of the digestive system: Secondary | ICD-10-CM

## 2019-03-13 DIAGNOSIS — M79672 Pain in left foot: Secondary | ICD-10-CM

## 2019-03-13 DIAGNOSIS — M79671 Pain in right foot: Secondary | ICD-10-CM

## 2019-03-13 DIAGNOSIS — Z96611 Presence of right artificial shoulder joint: Secondary | ICD-10-CM

## 2019-03-13 DIAGNOSIS — F329 Major depressive disorder, single episode, unspecified: Secondary | ICD-10-CM

## 2019-03-13 DIAGNOSIS — M25571 Pain in right ankle and joints of right foot: Secondary | ICD-10-CM | POA: Diagnosis not present

## 2019-03-13 DIAGNOSIS — M25572 Pain in left ankle and joints of left foot: Secondary | ICD-10-CM

## 2019-03-13 DIAGNOSIS — G8929 Other chronic pain: Secondary | ICD-10-CM

## 2019-03-13 DIAGNOSIS — D696 Thrombocytopenia, unspecified: Secondary | ICD-10-CM

## 2019-03-13 DIAGNOSIS — K760 Fatty (change of) liver, not elsewhere classified: Secondary | ICD-10-CM

## 2019-03-13 DIAGNOSIS — Z794 Long term (current) use of insulin: Secondary | ICD-10-CM

## 2019-03-13 DIAGNOSIS — C50412 Malignant neoplasm of upper-outer quadrant of left female breast: Secondary | ICD-10-CM

## 2019-03-14 NOTE — Progress Notes (Signed)
Uric acid is elevated.  Rest the labs are pending.  We will discuss treatment plan at the follow-up visit.

## 2019-03-15 LAB — CYCLIC CITRUL PEPTIDE ANTIBODY, IGG: Cyclic Citrullin Peptide Ab: <16 UNITS

## 2019-03-15 LAB — URIC ACID: Uric Acid, Serum: 8 mg/dL — ABNORMAL HIGH (ref 2.5–7.0)

## 2019-03-15 LAB — RHEUMATOID FACTOR: Rheumatoid fact SerPl-aCnc: <14 IU/mL (ref <14)

## 2019-03-15 NOTE — Progress Notes (Signed)
Rheumatoid factor and anti-CCP are negative.

## 2019-03-27 NOTE — Progress Notes (Signed)
Office Visit Note  Patient: [name] [name]             Date of Birth: 1950-10-06           MRN: [id number]             PCP: Leeroy Cha, MD Referring: Leeroy Cha,* Visit Date: 03/30/2019 Occupation: @GUAROCC @  Subjective:  Other (sore on left leg)   History of Present Illness: [name] [name] is a 69 y.o. female forget with history of osteoarthritis, degenerative disc disease and gout.  She states that she has not had a gout flare in a long time.  But she continues to have pain and discomfort in her hands.  She states she has been having intermittent swelling in her hands.  She continues to have pain and discomfort in her bilateral feet.  She states recently she has been having pain and discomfort in her left knee joint.  She states she had a rash on her left lower extremity for which she tried Neosporin but it did not go away.  Activities of Daily Living:  Patient reports morning stiffness for 1-2 hours.   Patient Reports nocturnal pain.  Difficulty dressing/grooming: Reports Difficulty climbing stairs: Reports Difficulty getting out of chair: Reports Difficulty using hands for taps, buttons, cutlery, and/or writing: Reports  Review of Systems  Constitutional: Positive for fatigue. Negative for night sweats, weight gain and weight loss.  HENT: Positive for mouth dryness and nose dryness. Negative for mouth sores, trouble swallowing and trouble swallowing.   Eyes: Positive for dryness. Negative for pain, redness and visual disturbance.  Respiratory: Negative for cough, shortness of breath and difficulty breathing.   Cardiovascular: Negative for chest pain, palpitations, hypertension, irregular heartbeat and swelling in legs/feet.  Gastrointestinal: Positive for diarrhea. Negative for blood in stool and constipation.  Endocrine: Negative for increased urination.  Genitourinary: Negative for difficulty urinating, painful urination and vaginal dryness.    Musculoskeletal: Positive for arthralgias, gait problem, joint pain, joint swelling and morning stiffness. Negative for myalgias, muscle weakness, muscle tenderness and myalgias.  Skin: Positive for rash. Negative for color change, hair loss, skin tightness, ulcers and sensitivity to sunlight.  Allergic/Immunologic: Negative for susceptible to infections.  Neurological: Negative for dizziness, numbness, headaches, memory loss, night sweats and weakness.  Hematological: Positive for bruising/bleeding tendency. Negative for swollen glands.  Psychiatric/Behavioral: Positive for sleep disturbance. Negative for depressed mood and confusion. The patient is not nervous/anxious.     PMFS History:  Patient Active Problem List   Diagnosis Date Noted  . Hepatic steatosis 11/07/2018  . Arthritis of shoulder 09/07/2018  . Type 2 diabetes mellitus with diabetic polyneuropathy, with long-term current use of insulin ([place]) 11/16/2016  . Anxiety and depression 09/01/2016  . Bilateral lower extremity edema 09/01/2016  . Hypothyroidism 09/01/2016  . Diabetic polyneuropathy associated with type 2 diabetes mellitus ([place]) 06/03/2016  . Achilles tendon contracture, bilateral 06/03/2016  . Malignant neoplasm of upper-outer quadrant of left breast in female, estrogen receptor positive ([place]) 08/19/2015  . Discharge from left nipple 08/03/2015  . Abnormal mammogram of left breast 08/03/2015  . Osteoarthritis of right knee 02/28/2015  . Status post total right knee replacement 02/28/2015  . Low TSH level 08/02/2014  . Splenomegaly 07/30/2014  . Thrombocytopenia ([place]) 07/30/2014  . 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 Recurrent falls 01/12/2013  . Neuropathy, diabetic ([place]) 01/12/2013  . Morbid obesity ([place]) 01/12/2013  . Degenerative arthritis of spine 01/12/2013  . Knee pain 01/12/2013  .  Cough 09/05/2012  . HBP (high blood pressure) 09/05/2012  . Chest wall contusion 12/09/2011  . Abdominal wall  contusion 12/09/2011    Past Medical History:  Diagnosis Date  . Anxiety   . Arthritis   . Blood dyscrasia    low platelet count- sees [name] , [name] ( Note in [place] from 07/2014) at [facility]  . Breast cancer [facility]) 2017   Left Breast  . Cancer ([place])    left breast  . Cirrhosis of liver ([place])   . Cough   . Diabetes mellitus without complication ([place])   . Diabetic neuropathy ([place])   . Gout   . Gout   . History of radiation therapy 10/22/15 - 12/08/15   left breast 50.4 Gy, boost to 10 Gy  . Hypertension   . Migraine   . 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 Neuromuscular disorder ([place])    diabetic neuropathy  . Neuropathy   . Personal history of radiation therapy 2017   Left Breast Cancer  . Sleep apnea    CPAP nightly  . Spleen enlarged   . Thyroid disease     Family History  Problem Relation Age of Onset  . Hypothyroidism Mother   . Diabetes Father   . Bone cancer Paternal Grandmother   . Heart failure Paternal Grandmother   . Cancer Maternal Grandmother   . Heart failure Maternal Grandfather   . Cancer Sister        GYN cancer  . Hypothyroidism Sister    Past Surgical History:  Procedure Laterality Date  . BREAST BIOPSY Left 08/04/2015   Procedure: LEFT BREAST BIOPSY WITH NEEDLE LOCALIZATION;  Surgeon: [name], MD;  Location: [place];  Service: General;  Laterality: Left;  . BREAST DUCTAL SYSTEM EXCISION Left 08/04/2015   Procedure: LEFT EXCISION DUCTAL SYSTEM BREAST;  Surgeon: [name], MD;  Location: [place];  Service: General;  Laterality: Left;  . BREAST LUMPECTOMY Left 08/2015  . BREAST LUMPECTOMY WITH AXILLARY LYMPH NODE BIOPSY Left 09/12/2015   Procedure: RE-EXCISION OF LEFT BREAST LUMPECTOMY WITH LEFT AXILLARY LYMPH NODE BIOPSY;  Surgeon: [name], MD;  Location: [place];  Service: General;  Laterality: Left;  . CHOLECYSTECTOMY    . JOINT REPLACEMENT     left knee   . JOINT REPLACEMENT  2017   right knee  . REVERSE SHOULDER ARTHROPLASTY Right 09/07/2018   Procedure: RIGHT REVERSE SHOULDER ARTHROPLASTY;  Surgeon: Meredith Pel, MD;  Location: [place];  Service: Orthopedics;  Laterality: Right;  . TOTAL KNEE ARTHROPLASTY Right 02/28/2015   Procedure: RIGHT TOTAL KNEE ARTHROPLASTY;  Surgeon: [name], MD;  [facility];  Service: Orthopedics;  Laterality: Right;  . TUBAL LIGATION     Social History   Social History Narrative  . Not on file    There is no immunization history on file for this patient.   Objective: Vital Signs: BP (!) 150/84 (BP Location: Left Arm, Patient Position: Sitting, Cuff Size: Normal)   Pulse 84   Resp 17   Ht 5' 1.5" (1.562 m)   Wt 276 lb 9.6 oz (125.5 kg)   BMI 51.42 kg/m    Physical Exam Vitals and nursing note reviewed.  Constitutional:      Appearance: She is well-developed.  HENT:     Head: Normocephalic and atraumatic.  Eyes:     Conjunctiva/sclera: Conjunctivae normal.  Cardiovascular:     Rate and Rhythm: Normal rate  and regular rhythm.     Heart sounds: Normal heart sounds.  Pulmonary:     Effort: Pulmonary effort is normal.     Breath sounds: Normal breath sounds.  Abdominal:     General: Bowel sounds are normal.     Palpations: Abdomen is soft.  Musculoskeletal:     Cervical back: Normal range of motion.  Lymphadenopathy:     Cervical: No cervical adenopathy.  Skin:    General: Skin is warm and dry.     Capillary Refill: Capillary refill takes less than 2 seconds.     Comments: keloid noted on the left thigh.  Neurological:     Mental Status: She is alert and oriented to person, place, and time.  Psychiatric:        Behavior: Behavior normal.      Musculoskeletal Exam: C-spine was in good range of motion.  Right shoulder joint has limited range of motion, which is replaced.  Elbow joints with good range of motion.  She has bilateral PIP and DIP thickening consistent  with osteoarthritis.  No synovitis was noted.  Both knee joints are replaced.  She has limited range of motion and thickening of bilateral ankle joints but no synovitis was noted.  CDAI Exam: CDAI Score: -- Patient Global: --; Provider Global: -- Swollen: --; Tender: -- Joint Exam 03/30/2019   No joint exam has been documented for this visit   There is currently no information documented on the homunculus. Go to the Rheumatology activity and complete the homunculus joint exam.  Investigation: No additional findings.  Imaging: XR Ankle 2 Views Left  Result Date: 03/13/2019 Mild tibiotalar joint space narrowing was noted.  Subtalar collapse was noted.  Dorsal spurring was noted.  Inferior and posterior calcaneal spurs were noted. Impression: These findings are consistent with arthritis of the ankle joint most likely Charcot joint.  XR Ankle 2 Views Right  Result Date: 03/13/2019 Tibiotalar joint space narrowing was noted.  Spurring around the tibia was noted.  Mild subtalar joint space narrowing was noted.  Inferior and posterior calcaneal spurs were noted. Impression: These findings are consistent with arthritis of the ankle joint.  Most likely Charcot joint.  XR Foot 2 Views Left  Result Date: 03/13/2019 First MTP, PIP and DIP narrowing was noted.  No erosive changes were noted.  No intertarsal narrowing was noted.  Dorsal spurring was noted.  Posterior calcification around talus was noted.  Inferior calcaneal spurs were noted. Impression: These findings are consistent with osteoarthritis of the foot.  XR Foot 2 Views Right  Result Date: 03/13/2019 First MTP, PIP and DIP narrowing was noted.  No erosive changes were noted.  No intertarsal narrowing was noted.  Dorsal spurring was noted.  Posterior calcification around talus was noted.  Inferior calcaneal spurs were noted. Impression: These findings are consistent with osteoarthritis of the foot.  XR Shoulder Left  Result Date:  03/13/2019 No glenohumeral narrowing was noted.  No acromioclavicular narrowing was noted.  Inferior spurring was noted at the humeral head.  Acromial spurring was noted. Impression: Spurring was noted inferiorly at the humeral head and the acromial region.   Recent Labs: Lab Results  Component Value Date   WBC 4.5 11/07/2018   HGB 12.0 11/07/2018   PLT 83 (L) 11/07/2018   NA 139 11/07/2018   K 4.0 11/07/2018   CL 105 11/07/2018   CO2 23 11/07/2018   GLUCOSE 208 (H) 11/07/2018   BUN 16 11/07/2018   CREATININE 0.96  11/07/2018   BILITOT 0.6 11/07/2018   ALKPHOS 90 11/07/2018   AST 28 11/07/2018   ALT 16 11/07/2018   PROT 7.7 11/07/2018   ALBUMIN 3.9 11/07/2018   CALCIUM 10.2 11/07/2018   GFRAA >60 11/07/2018  March 13, 2027 RF negative, anti-CCP negative, uric acid 8.0  Speciality Comments: No specialty comments available.  Procedures:  No procedures performed Allergies: Codeine, Influenza a (h1n1) monoval pf, and Jardiance [empagliflozin]   Assessment / Plan:     Visit Diagnoses: Bilateral ankle joint pain - Ankle joint x-rays obtained at the last visit were consistent with Charcot joint.  She continues to have pain and discomfort in her bilateral ankles.  Use of Tylenol was discussed.  Ankle joint exercises were discussed.  All autoimmune work-up was negative which was discussed at length.  Primary osteoarthritis of both feet-proper fitting shoes were discussed.  Status post total replacement of right shoulder - September 09, 2018 by Dr. Marlou Sa.  She has some limitation with range of motion.  Chronic left shoulder pain - X-ray of the shoulder joint was unremarkable.  Primary osteoarthritis of both hands-she has bilateral DIP and PIP thickening consistent with osteoarthritis.  No synovitis was noted.  Joint protection muscle strengthening was discussed.  Status post total knee replacement, bilateral - Left total knee replacement 2013  History of gout -patient has not had a gout  flare in a long time.  Patient is taken colchicine and allopurinol in the past.  She does have a colchicine prescription at home which she has not used in a long time.  She has hyperuricemia.  Low purine diet was discussed.  I have advised her to contact me in case she develops any gout flare.  Folliculitis-patient most likely had folliculitis recently on her left thigh.  She applied Neosporin which is resolved.  She only has a scar tissue and some excoriation mark.  Other medical problems are listed as follows:  Diabetic polyneuropathy associated with type 2 diabetes mellitus ([place])  Type 2 diabetes mellitus with diabetic polyneuropathy, with long-term current use of insulin (H[facility])  Hepatic steatosis  History of cirrhosis of liver  Essential hypertension  Splenomegaly  Thrombocytopenia ([facility])  Malignant neoplasm of upper-outer quadrant of left breast in female, estrogen receptor positive ([facility])  Anxiety and depression  Multifactorial gait disorder  History of hypothyroidism  Orders: No orders of the defined types were placed in this encounter.  No orders of the defined types were placed in this encounter.     Follow-Up Instructions: Return if symptoms worsen or fail to improve, for Osteoarthritis, Gout.   Bo Merino, MD  Note - This record has been created using Editor, commissioning.  Chart creation errors have been sought, but [place] not always  have been located. Such creation errors do not reflect on  the standard of medical care.

## 2019-03-30 ENCOUNTER — Ambulatory Visit (INDEPENDENT_AMBULATORY_CARE_PROVIDER_SITE_OTHER): Payer: Medicare Other | Admitting: Rheumatology

## 2019-03-30 ENCOUNTER — Encounter: Payer: Self-pay | Admitting: Rheumatology

## 2019-03-30 ENCOUNTER — Other Ambulatory Visit: Payer: Self-pay

## 2019-03-30 VITALS — BP 150/84 | HR 84 | Resp 17 | Ht 61.5 in | Wt 276.6 lb

## 2019-03-30 DIAGNOSIS — Z170 Estrogen receptor positive status [ER+]: Secondary | ICD-10-CM

## 2019-03-30 DIAGNOSIS — R2689 Other abnormalities of gait and mobility: Secondary | ICD-10-CM

## 2019-03-30 DIAGNOSIS — Z96611 Presence of right artificial shoulder joint: Secondary | ICD-10-CM

## 2019-03-30 DIAGNOSIS — M25512 Pain in left shoulder: Secondary | ICD-10-CM

## 2019-03-30 DIAGNOSIS — M25572 Pain in left ankle and joints of left foot: Secondary | ICD-10-CM

## 2019-03-30 DIAGNOSIS — M19072 Primary osteoarthritis, left ankle and foot: Secondary | ICD-10-CM

## 2019-03-30 DIAGNOSIS — M19071 Primary osteoarthritis, right ankle and foot: Secondary | ICD-10-CM | POA: Diagnosis not present

## 2019-03-30 DIAGNOSIS — M19042 Primary osteoarthritis, left hand: Secondary | ICD-10-CM

## 2019-03-30 DIAGNOSIS — M25571 Pain in right ankle and joints of right foot: Secondary | ICD-10-CM

## 2019-03-30 DIAGNOSIS — F329 Major depressive disorder, single episode, unspecified: Secondary | ICD-10-CM

## 2019-03-30 DIAGNOSIS — Z8739 Personal history of other diseases of the musculoskeletal system and connective tissue: Secondary | ICD-10-CM | POA: Diagnosis not present

## 2019-03-30 DIAGNOSIS — E1142 Type 2 diabetes mellitus with diabetic polyneuropathy: Secondary | ICD-10-CM

## 2019-03-30 DIAGNOSIS — K760 Fatty (change of) liver, not elsewhere classified: Secondary | ICD-10-CM | POA: Diagnosis not present

## 2019-03-30 DIAGNOSIS — R161 Splenomegaly, not elsewhere classified: Secondary | ICD-10-CM

## 2019-03-30 DIAGNOSIS — Z96653 Presence of artificial knee joint, bilateral: Secondary | ICD-10-CM | POA: Diagnosis not present

## 2019-03-30 DIAGNOSIS — M19041 Primary osteoarthritis, right hand: Secondary | ICD-10-CM | POA: Diagnosis not present

## 2019-03-30 DIAGNOSIS — D696 Thrombocytopenia, unspecified: Secondary | ICD-10-CM

## 2019-03-30 DIAGNOSIS — C50412 Malignant neoplasm of upper-outer quadrant of left female breast: Secondary | ICD-10-CM

## 2019-03-30 DIAGNOSIS — I10 Essential (primary) hypertension: Secondary | ICD-10-CM

## 2019-03-30 DIAGNOSIS — Z8639 Personal history of other endocrine, nutritional and metabolic disease: Secondary | ICD-10-CM

## 2019-03-30 DIAGNOSIS — Z8719 Personal history of other diseases of the digestive system: Secondary | ICD-10-CM

## 2019-03-30 DIAGNOSIS — G8929 Other chronic pain: Secondary | ICD-10-CM

## 2019-03-30 DIAGNOSIS — Z794 Long term (current) use of insulin: Secondary | ICD-10-CM

## 2019-03-30 DIAGNOSIS — F419 Anxiety disorder, unspecified: Secondary | ICD-10-CM

## 2019-03-30 DIAGNOSIS — L739 Follicular disorder, unspecified: Secondary | ICD-10-CM

## 2019-04-03 ENCOUNTER — Ambulatory Visit: Payer: Medicare Other | Admitting: Rheumatology

## 2019-04-17 ENCOUNTER — Other Ambulatory Visit: Payer: Self-pay | Admitting: Internal Medicine

## 2019-04-24 ENCOUNTER — Other Ambulatory Visit: Payer: Self-pay | Admitting: Internal Medicine

## 2019-04-24 DIAGNOSIS — I10 Essential (primary) hypertension: Secondary | ICD-10-CM | POA: Diagnosis not present

## 2019-04-24 DIAGNOSIS — F3341 Major depressive disorder, recurrent, in partial remission: Secondary | ICD-10-CM | POA: Diagnosis not present

## 2019-04-24 DIAGNOSIS — Z96651 Presence of right artificial knee joint: Secondary | ICD-10-CM | POA: Diagnosis not present

## 2019-04-24 DIAGNOSIS — J301 Allergic rhinitis due to pollen: Secondary | ICD-10-CM | POA: Diagnosis not present

## 2019-04-24 DIAGNOSIS — E1140 Type 2 diabetes mellitus with diabetic neuropathy, unspecified: Secondary | ICD-10-CM | POA: Diagnosis not present

## 2019-04-24 DIAGNOSIS — Z96611 Presence of right artificial shoulder joint: Secondary | ICD-10-CM | POA: Diagnosis not present

## 2019-05-03 ENCOUNTER — Other Ambulatory Visit: Payer: Self-pay | Admitting: Internal Medicine

## 2019-05-07 ENCOUNTER — Other Ambulatory Visit: Payer: Self-pay | Admitting: Internal Medicine

## 2019-05-07 DIAGNOSIS — E785 Hyperlipidemia, unspecified: Secondary | ICD-10-CM | POA: Diagnosis not present

## 2019-05-07 DIAGNOSIS — F3341 Major depressive disorder, recurrent, in partial remission: Secondary | ICD-10-CM | POA: Diagnosis not present

## 2019-05-07 DIAGNOSIS — Z780 Asymptomatic menopausal state: Secondary | ICD-10-CM

## 2019-05-07 DIAGNOSIS — M150 Primary generalized (osteo)arthritis: Secondary | ICD-10-CM | POA: Diagnosis not present

## 2019-05-07 DIAGNOSIS — R2681 Unsteadiness on feet: Secondary | ICD-10-CM | POA: Diagnosis not present

## 2019-05-07 DIAGNOSIS — R946 Abnormal results of thyroid function studies: Secondary | ICD-10-CM | POA: Diagnosis not present

## 2019-05-07 DIAGNOSIS — Z0000 Encounter for general adult medical examination without abnormal findings: Secondary | ICD-10-CM | POA: Diagnosis not present

## 2019-05-07 DIAGNOSIS — I10 Essential (primary) hypertension: Secondary | ICD-10-CM | POA: Diagnosis not present

## 2019-05-07 DIAGNOSIS — E039 Hypothyroidism, unspecified: Secondary | ICD-10-CM | POA: Diagnosis not present

## 2019-05-07 DIAGNOSIS — Z23 Encounter for immunization: Secondary | ICD-10-CM | POA: Diagnosis not present

## 2019-05-07 DIAGNOSIS — K7581 Nonalcoholic steatohepatitis (NASH): Secondary | ICD-10-CM | POA: Diagnosis not present

## 2019-05-07 DIAGNOSIS — M109 Gout, unspecified: Secondary | ICD-10-CM | POA: Diagnosis not present

## 2019-05-07 DIAGNOSIS — E1143 Type 2 diabetes mellitus with diabetic autonomic (poly)neuropathy: Secondary | ICD-10-CM | POA: Diagnosis not present

## 2019-05-07 DIAGNOSIS — G4730 Sleep apnea, unspecified: Secondary | ICD-10-CM | POA: Diagnosis not present

## 2019-05-07 DIAGNOSIS — R7989 Other specified abnormal findings of blood chemistry: Secondary | ICD-10-CM | POA: Diagnosis not present

## 2019-05-21 ENCOUNTER — Other Ambulatory Visit: Payer: Self-pay

## 2019-05-21 ENCOUNTER — Encounter: Payer: Self-pay | Admitting: Internal Medicine

## 2019-05-21 ENCOUNTER — Ambulatory Visit (INDEPENDENT_AMBULATORY_CARE_PROVIDER_SITE_OTHER): Payer: Medicare Other | Admitting: Internal Medicine

## 2019-05-21 VITALS — BP 138/82 | HR 80 | Temp 98.3°F | Ht 61.0 in | Wt 278.0 lb

## 2019-05-21 DIAGNOSIS — E039 Hypothyroidism, unspecified: Secondary | ICD-10-CM | POA: Diagnosis not present

## 2019-05-21 DIAGNOSIS — E1165 Type 2 diabetes mellitus with hyperglycemia: Secondary | ICD-10-CM | POA: Diagnosis not present

## 2019-05-21 DIAGNOSIS — Z794 Long term (current) use of insulin: Secondary | ICD-10-CM

## 2019-05-21 DIAGNOSIS — E1142 Type 2 diabetes mellitus with diabetic polyneuropathy: Secondary | ICD-10-CM

## 2019-05-21 MED ORDER — LEVOTHYROXINE SODIUM 137 MCG PO TABS: 137.0000 ug | ORAL_TABLET | Freq: Every day | ORAL | 1 refills | Status: DC

## 2019-05-21 MED ORDER — FARXIGA 10 MG PO TABS: 10.0000 mg | ORAL_TABLET | Freq: Every day | ORAL | 6 refills | Status: DC

## 2019-05-21 NOTE — Patient Instructions (Addendum)
-   Continue Lantus at 50 units daily  - Continue Victoza 1.8 mg daily  - Increase Farxig 10 mg daily   - Increase Levothyroxine to 137 mcg daily ( In the meantime you can take 2 tablets of the 125 mcg on Sundays and 1 tablet the rest of the week to finish them )     HOW TO TREAT LOW BLOOD SUGARS (Blood sugar LESS THAN 70 MG/DL)  Please follow the RULE OF 15 for the treatment of hypoglycemia treatment (when your (blood sugars are less than 70 mg/dL)    STEP 1: Take 15 grams of carbohydrates when your blood sugar is low, which includes:   3-4 GLUCOSE TABS  OR  3-4 OZ OF JUICE OR REGULAR SODA OR  ONE TUBE OF GLUCOSE GEL     STEP 2: RECHECK blood sugar in 15 MINUTES STEP 3: If your blood sugar is still low at the 15 minute recheck --> then, go back to STEP 1 and treat AGAIN with another 15 grams of carbohydrates.

## 2019-05-21 NOTE — Progress Notes (Signed)
Name: [name] [name]  Age/ Sex: 69 y.o., female   MRN/ DOB: [id number], 1950-11-18     PCP: Leeroy Cha, MD   Reason for Endocrinology Evaluation: Type 2 Diabetes Mellitus  Initial Endocrine Consultative Visit: 03/20/2018    PATIENT IDENTIFIER: [name] [name] is a 69 y.o. female with a past medical history of T2DM, hypothyroidism, HTN and Hx of breast Ca . The patient has followed with Endocrinology clinic since 03/20/2018 for consultative assistance with management of her diabetes.  DIABETIC HISTORY:  [name] [name] was diagnosed with T2DM many years ago, she is intolerant to Metformin. She was started on Jardiance January, 2020 but due to dizziness this was stopped in February, 2020.She was continued on Victoza and Lantus at the time.  Her hemoglobin A1c has ranged from 6.6% in 2017, peaking at 8.5%  In 2020 Farxiga started 08/2018   Hypothyroidism: She was diagnosed with hypothyroidism many years ago. She has been on LT-4 replacement since her diagnosis. She takes it appropriately   SUBJECTIVE:   During the last visit (08/21/2018): A1c 7.8%. Started Farxiga, continued Lantus and Victoza   Today (05/21/2019): [name] [name] is here for a months  follow up on diabetes management.She has not been here for the past 9 months.   She checks her blood sugars 2 times daily, preprandial to breakfast and bedtime. The patient has not had hypoglycemic episodes since the last clinic visit. The patient admits to snacking at times and reduced ability to exercise due to joint aches and pain. Otherwise, the patient has not required any recent emergency interventions for hypoglycemia and has not had recent hospitalizations secondary to hyper or hypoglycemic episodes.   Has been having a lot of joint pains and aches  She was also noted to have a TSH 5.8 uIU/mL in April 2021  ROS: As per HPI and as detailed below: Review of Systems  Gastrointestinal: Negative for diarrhea and  nausea.  Genitourinary: Negative for frequency.  Musculoskeletal: Positive for joint pain.  Skin: Negative.   Neurological: Negative for dizziness.  Endo/Heme/Allergies: Negative for polydipsia.      HOME DIABETES REGIMEN:    Victoza 1.8 mg daily  Lantus 28 units daily- increased to 50 unit by [employer] 5 mg daily- ran out 1 week    GLUCOSE LOG:  BG range >180 mg/dL      HISTORY:  Past Medical History:  Past Medical History:  Diagnosis Date  . Anxiety   . Arthritis   . Blood dyscrasia    low platelet count- sees [name] , [name] ( Note in [place] from 07/2014) at [facility]  . Breast cancer [facility]) 2017   Left Breast  . Cancer ([place])    left breast  . Cirrhosis of liver ([place])   . Cough   . Diabetes mellitus without complication ([place])   . Diabetic neuropathy ([place])   . Gout   . Gout   . History of radiation therapy 10/22/15 - 12/08/15   left breast 50.4 Gy, boost to 10 Gy  . Hypertension   . Migraine   . 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 Neuromuscular disorder ([place])    diabetic neuropathy  . Neuropathy   . Personal history of radiation therapy 2017   Left Breast Cancer  . Sleep apnea    CPAP nightly  . Spleen enlarged   . Thyroid disease    Past Surgical History:  Past Surgical History:  Procedure Laterality Date  .  BREAST BIOPSY Left 08/04/2015   Procedure: LEFT BREAST BIOPSY WITH NEEDLE LOCALIZATION;  Surgeon: [name], MD;  Location: [place];  Service: General;  Laterality: Left;  . BREAST DUCTAL SYSTEM EXCISION Left 08/04/2015   Procedure: LEFT EXCISION DUCTAL SYSTEM BREAST;  Surgeon: [name], MD;  Location: [place];  Service: General;  Laterality: Left;  . BREAST LUMPECTOMY Left 08/2015  . BREAST LUMPECTOMY WITH AXILLARY LYMPH NODE BIOPSY Left 09/12/2015   Procedure: RE-EXCISION OF LEFT BREAST LUMPECTOMY WITH LEFT AXILLARY LYMPH NODE BIOPSY;  Surgeon: [name],  MD;  Location: [place];  Service: General;  Laterality: Left;  . CHOLECYSTECTOMY    . JOINT REPLACEMENT     left knee  . JOINT REPLACEMENT  2017   right knee  . REVERSE SHOULDER ARTHROPLASTY Right 09/07/2018   Procedure: RIGHT REVERSE SHOULDER ARTHROPLASTY;  Surgeon: Meredith Pel, MD;  Location: [place];  Service: Orthopedics;  Laterality: Right;  . TOTAL KNEE ARTHROPLASTY Right 02/28/2015   Procedure: RIGHT TOTAL KNEE ARTHROPLASTY;  Surgeon: [name], MD;  [facility];  Service: Orthopedics;  Laterality: Right;  . TUBAL LIGATION      Social History:  reports that she has never smoked. She has never used smokeless tobacco. She reports that she does not drink alcohol or use drugs. Family History:  Family History  Problem Relation Age of Onset  . Hypothyroidism Mother   . Diabetes Father   . Bone cancer Paternal Grandmother   . Heart failure Paternal Grandmother   . Cancer Maternal Grandmother   . Heart failure Maternal Grandfather   . Cancer Sister        GYN cancer  . Hypothyroidism Sister      HOME MEDICATIONS: Allergies as of 05/21/2019      Reactions   Codeine Swelling   Influenza A (h1n1) Monoval Pf Anaphylaxis   Jardiance [empagliflozin] Other (See Comments)   dehydration      Medication List       Accurate as of May 21, 2019 10:00 AM. If you have any questions, ask your nurse or doctor.        acetaminophen 325 MG tablet Commonly known as: TYLENOL Take 650 mg by mouth every 6 (six) hours as needed for mild pain.   anastrozole 1 MG tablet Commonly known as: ARIMIDEX Take 1 tablet (1 mg total) by mouth daily.   citalopram 40 MG tablet Commonly known as: CELEXA Take 40 mg by mouth daily.   Farxiga 5 MG Tabs tablet Generic drug: dapagliflozin propanediol TAKE 1 TABLET BY MOUTH EVERY DAY   fluticasone 50 MCG/ACT nasal spray Commonly known as: FLONASE Place 1 spray into both nostrils as needed.   gabapentin 400  MG capsule Commonly known as: NEURONTIN Take 400-800 mg by mouth See admin instructions. Takes one capsule (400mg ) in the morning, AND afternoon. And two capsules (800mg ) at bedtime What changed: Another medication with the same name was removed. Continue taking this medication, and follow the directions you see here. Changed by: Dorita Sciara, MD   Lantus 100 UNIT/ML injection Generic drug: insulin glargine Inject 50 Units into the skin at bedtime.   levothyroxine 125 MCG tablet Commonly known as: SYNTHROID Take 1 tablet (125 mcg total) by mouth daily.   losartan 25 MG tablet Commonly known as: COZAAR Take 1 tablet (25 mg total) by mouth 2 (two) times daily.   methocarbamol 500 MG tablet Commonly known as: ROBAXIN Take 1  tablet (500 mg total) by mouth every 8 (eight) hours as needed for muscle spasms.   multivitamin with minerals tablet Take 1 tablet by mouth daily.   Tart Cherry Advanced Caps Take 1 capsule by mouth daily.   TURMERIC PO Take by mouth daily.   Ubrogepant 50 MG Tabs Commonly known as: Ubrelvy Take 50 mg by mouth as needed ([place] repeat in 2 hours, for acute migraine headache.).   Victoza 18 MG/3ML Sopn Generic drug: liraglutide Inject 1.8 mg into the skin daily.   VITAMIN B-12 PO Take by mouth daily.   VITAMIN B-6 PO Take by mouth daily.   VITAMIN C PO Take by mouth daily.   VITAMIN D PO Take by mouth daily.        OBJECTIVE:   Vital Signs: BP 138/82 (BP Location: Left Arm, Patient Position: Sitting, Cuff Size: Large)   Pulse 80   Temp 98.3 F (36.8 C)   Ht 5\' 1"  (1.549 m)   Wt 278 lb (126.1 kg)   SpO2 98%   BMI 52.53 kg/m   Wt Readings from Last 3 Encounters:  05/21/19 278 lb (126.1 kg)  03/30/19 276 lb 9.6 oz (125.5 kg)  03/13/19 275 lb (124.7 kg)     Exam: General: Pt appears well and is in NAD  Lungs: Clear with good BS bilat with no rales, rhonchi, or wheezes  Heart: RRR with normal S1 and S2 and no gallops; no  murmurs; no rub  Extremities: No pretibial edema. No tremor.   Neuro: MS is good with appropriate affect, pt is alert and Ox3    DM foot exam:05/21/2019 The skin of the feet is intact without sores or ulcerations., Left big toe bruise The pedal pulses are 2+ on right and 2+ on left. The sensation is absent to a screening 5.07, 10 gram monofilament bilaterally     DATA REVIEWED:  Lab Results  Component Value Date   HGBA1C 7.8 (A) 08/21/2018   HGBA1C 7.1 (A) 05/15/2018   HGBA1C 6.6 (H) 02/21/2015   Lab Results  Component Value Date   CREATININE 0.96 11/07/2018        05/07/2019 A1c 8.4% BUN/Cr 23/0.910  MA/Cr ratio 17.9  ASSESSMENT / PLAN / RECOMMENDATIONS:   1) Type 2 Diabetes Mellitus, Poorly controlled, With neuropathic complications - Most recent A1c of 8.4 %. Goal A1c < 7.0 %.    - Unfortunately her glucose control has trended up - She was part of a research study through [facility] and they increased basal insulin to   -  Jardiance caused dizziness but she is tolerating the [place] without issues.    MEDICATIONS:  Continue Lantus 50 units daily  Continue Victoza at 1.8 mg daily  Increase Farxiga to 10 mg daily   EDUCATION / INSTRUCTIONS:  BG monitoring instructions: Patient is instructed to check her blood sugars 2 times a day, fasting and bedtime.  Call [facility] if: BG persistently < 70 or > 300. . I reviewed the Rule of 15 for the treatment of hypoglycemia in detail with the patient. Literature supplied.  2)Hypothyroidism :   - TSH minimally elevated, will adjust the dose as below  Medications Increase   Levothyroxine to 137 mcg daily   F/U in 3 months    Signed electronically by: Mack Guise, MD  [facility] End[facility]  [facility] [address] [place], [place] [address] Phone: [phone] FAX: [phone]   CC: Leeroy Cha, MD [address] [place] [address]  Phone: [phone]  Fax: [phone]  Return to Endocrinology clinic as below: Future Appointments  Date Time Provider [place]  06/11/2019  7:00 AM [facility] DX DEXA 1 [facility] GI-BREAST CE  12/18/2019 11:00 AM [facility] LAB 1 [facility] None  12/18/2019 11:30 AM Magrinat, Virgie Dad, MD [facility] None

## 2019-05-25 ENCOUNTER — Other Ambulatory Visit: Payer: Self-pay | Admitting: Gastroenterology

## 2019-05-25 DIAGNOSIS — K7460 Unspecified cirrhosis of liver: Secondary | ICD-10-CM

## 2019-05-30 ENCOUNTER — Other Ambulatory Visit: Payer: Medicare Other

## 2019-05-30 DIAGNOSIS — E1143 Type 2 diabetes mellitus with diabetic autonomic (poly)neuropathy: Secondary | ICD-10-CM | POA: Diagnosis not present

## 2019-05-30 DIAGNOSIS — G4733 Obstructive sleep apnea (adult) (pediatric): Secondary | ICD-10-CM | POA: Diagnosis not present

## 2019-06-11 ENCOUNTER — Ambulatory Visit
Admission: RE | Admit: 2019-06-11 | Discharge: 2019-06-11 | Disposition: A | Discharge status: Home / Self Care | Payer: Medicare Other | Source: Ambulatory Visit | Attending: Internal Medicine | Admitting: Internal Medicine

## 2019-06-11 ENCOUNTER — Other Ambulatory Visit: Payer: Self-pay

## 2019-06-11 DIAGNOSIS — Z780 Asymptomatic menopausal state: Secondary | ICD-10-CM

## 2019-06-12 ENCOUNTER — Ambulatory Visit
Admission: RE | Admit: 2019-06-12 | Discharge: 2019-06-12 | Disposition: A | Discharge status: Home / Self Care | Payer: Medicare Other | Source: Ambulatory Visit | Attending: Gastroenterology | Admitting: Gastroenterology

## 2019-06-12 DIAGNOSIS — R161 Splenomegaly, not elsewhere classified: Secondary | ICD-10-CM | POA: Diagnosis not present

## 2019-06-12 DIAGNOSIS — K7460 Unspecified cirrhosis of liver: Secondary | ICD-10-CM | POA: Diagnosis not present

## 2019-06-18 DIAGNOSIS — Z03818 Encounter for observation for suspected exposure to other biological agents ruled out: Secondary | ICD-10-CM | POA: Diagnosis not present

## 2019-06-18 DIAGNOSIS — Z20828 Contact with and (suspected) exposure to other viral communicable diseases: Secondary | ICD-10-CM | POA: Diagnosis not present

## 2019-07-09 ENCOUNTER — Other Ambulatory Visit: Payer: Self-pay

## 2019-07-09 ENCOUNTER — Ambulatory Visit
Admission: RE | Admit: 2019-07-09 | Discharge: 2019-07-09 | Disposition: A | Discharge status: Home / Self Care | Payer: Medicare Other | Source: Ambulatory Visit | Attending: Internal Medicine | Admitting: Internal Medicine

## 2019-07-09 DIAGNOSIS — K7581 Nonalcoholic steatohepatitis (NASH): Secondary | ICD-10-CM | POA: Diagnosis not present

## 2019-07-09 DIAGNOSIS — Z800 Family history of malignant neoplasm of digestive organs: Secondary | ICD-10-CM | POA: Diagnosis not present

## 2019-07-09 DIAGNOSIS — Z780 Asymptomatic menopausal state: Secondary | ICD-10-CM | POA: Diagnosis not present

## 2019-08-24 ENCOUNTER — Ambulatory Visit (INDEPENDENT_AMBULATORY_CARE_PROVIDER_SITE_OTHER): Payer: Medicare Other | Admitting: Internal Medicine

## 2019-08-24 ENCOUNTER — Other Ambulatory Visit: Payer: Self-pay

## 2019-08-24 ENCOUNTER — Encounter: Payer: Self-pay | Admitting: Internal Medicine

## 2019-08-24 VITALS — BP 122/62 | HR 91 | Ht 61.0 in | Wt 277.8 lb

## 2019-08-24 DIAGNOSIS — E039 Hypothyroidism, unspecified: Secondary | ICD-10-CM

## 2019-08-24 DIAGNOSIS — E1142 Type 2 diabetes mellitus with diabetic polyneuropathy: Secondary | ICD-10-CM

## 2019-08-24 DIAGNOSIS — Z794 Long term (current) use of insulin: Secondary | ICD-10-CM

## 2019-08-24 LAB — POCT GLYCOSYLATED HEMOGLOBIN (HGB A1C): Hemoglobin A1C: 7.2 % — AB (ref 4.0–5.6)

## 2019-08-24 LAB — T4, FREE: Free T4: 1.12 ng/dL (ref 0.60–1.60)

## 2019-08-24 LAB — BASIC METABOLIC PANEL
BUN: 17 mg/dL (ref 6–23)
CO2: 23 mEq/L (ref 19–32)
Calcium: 10.1 mg/dL (ref 8.4–10.5)
Chloride: 102 mEq/L (ref 96–112)
Creatinine, Ser: 1.08 mg/dL (ref 0.40–1.20)
GFR: 50.27 mL/min — ABNORMAL LOW (ref >60.00)
Glucose, Bld: 325 mg/dL — ABNORMAL HIGH (ref 70–99)
Potassium: 3.8 mEq/L (ref 3.5–5.1)
Sodium: 137 mEq/L (ref 135–145)

## 2019-08-24 LAB — TSH: TSH: 1.91 u[IU]/mL (ref 0.35–4.50)

## 2019-08-24 MED ORDER — LANTUS SOLOSTAR 100 UNIT/ML ~~LOC~~ SOPN: 55.0000 [IU] | PEN_INJECTOR | Freq: Every day | SUBCUTANEOUS | 3 refills | Status: DC

## 2019-08-24 MED ORDER — INSULIN PEN NEEDLE 32G X 4 MM MISC: 1.0000 | 3 refills | Status: DC | PRN

## 2019-08-24 NOTE — Patient Instructions (Addendum)
-   Increase Lantus to 55 units daily  - Continue Victoza 1.8 mg daily  - Continue  Farxig 10 mg daily   -  Levothyroxine to 137 mcg daily    HOW TO TREAT LOW BLOOD SUGARS (Blood sugar LESS THAN 70 MG/DL)  Please follow the RULE OF 15 for the treatment of hypoglycemia treatment (when your (blood sugars are less than 70 mg/dL)    STEP 1: Take 15 grams of carbohydrates when your blood sugar is low, which includes:   3-4 GLUCOSE TABS  OR  3-4 OZ OF JUICE OR REGULAR SODA OR  ONE TUBE OF GLUCOSE GEL     STEP 2: RECHECK blood sugar in 15 MINUTES STEP 3: If your blood sugar is still low at the 15 minute recheck --> then, go back to STEP 1 and treat AGAIN with another 15 grams of carbohydrates.

## 2019-08-24 NOTE — Progress Notes (Signed)
Name: [name] [name]  Age/ Sex: 69 y.o., female   MRN/ DOB: [id number], August 11, 1950     PCP: Leeroy Cha, MD   Reason for Endocrinology Evaluation: Type 2 Diabetes Mellitus  Initial Endocrine Consultative Visit: 03/20/2018    PATIENT IDENTIFIER: [name] [name] is a 69 y.o. female with a past medical history of T2DM, hypothyroidism, HTN and Hx of breast Ca . The patient has followed with Endocrinology clinic since 03/20/2018 for consultative assistance with management of her diabetes.  DIABETIC HISTORY:  [name] [name] was diagnosed with T2DM many years ago, she is intolerant to Metformin. She was started on Jardiance January, 2020 but due to dizziness this was stopped in February, 2020.She was continued on Victoza and Lantus at the time.  Her hemoglobin A1c has ranged from 6.6% in 2017, peaking at 8.5%  In 2020 Farxiga started 08/2018   Hypothyroidism: She was diagnosed with hypothyroidism many years ago. She has been on LT-4 replacement since her diagnosis. She takes it appropriately   SUBJECTIVE:   During the last visit (05/21/2019): A1c 8.4 %. Increased Farxiga, continued Lantus and Victoza   Today (08/24/2019): [name] [name] is here for a months  follow up on diabetes management.   She checks her blood sugars 2 times daily, preprandial to breakfast and bedtime. The patient has not had hypoglycemic episodes since the last clinic visit. The patient continues with ankle pains.     Takes levothyroxine as prescribe Noted slight improvement in energy level.  Denies constipation  Denies local neck swelling, but has occasional odynophagia    HOME DIABETES REGIMEN:   Victoza 1.8 mg daily  Lantus 50 units daily  Farxiga 10 mg daily   METER DOWNLOAD SUMMARY: 6/24-7/23/2021 Fingerstick Blood Glucose Tests = 47 Average Number Tests/Day = 1.6 Overall Mean FS Glucose = 193 Standard Deviation = 35.6  BG Ranges: Low = 133 High = 280    Hypoglycemic Events/30 Days: BG < 50 =  0 Episodes of symptomatic severe hypoglycemia = 0       HISTORY:  Past Medical History:  Past Medical History:  Diagnosis Date   Anxiety    Arthritis    Blood dyscrasia    low platelet count- sees [name] , [name] ( Note in EPIC from 07/2014) at [facility]) 2017   Left Breast   Cancer ([place])    left breast   Cirrhosis of liver ([place])    Cough    Diabetes mellitus without complication ([place])    Diabetic neuropathy ([place])    Gout    Gout    History of radiation therapy 10/22/15 - 12/08/15   left breast 50.4 Gy, boost to 10 Gy   Hypertension    Migraine    Multifactorial gait disorder 01/12/2013   Neuromuscular disorder ([place])    diabetic neuropathy   Neuropathy    Personal history of radiation therapy 2017   Left Breast Cancer   Sleep apnea    CPAP nightly   Spleen enlarged    Thyroid disease    Past Surgical History:  Past Surgical History:  Procedure Laterality Date   BREAST BIOPSY Left 08/04/2015   Procedure: LEFT BREAST BIOPSY WITH NEEDLE LOCALIZATION;  Surgeon: [name], MD;  Location: [place];  Service: General;  Laterality: Left;   BREAST DUCTAL SYSTEM EXCISION Left 08/04/2015   Procedure: LEFT EXCISION DUCTAL SYSTEM BREAST;  Surgeon: [name], MD;  Location: [place];  Service: General;  Laterality: Left;   BREAST LUMPECTOMY Left 08/2015   BREAST LUMPECTOMY WITH AXILLARY LYMPH NODE BIOPSY Left 09/12/2015   Procedure: RE-EXCISION OF LEFT BREAST LUMPECTOMY WITH LEFT AXILLARY LYMPH NODE BIOPSY;  Surgeon: [name], MD;  Location: [place];  Service: General;  Laterality: Left;   CHOLECYSTECTOMY     JOINT REPLACEMENT     left knee   JOINT REPLACEMENT  2017   right knee   REVERSE SHOULDER ARTHROPLASTY Right 09/07/2018   Procedure: RIGHT REVERSE SHOULDER ARTHROPLASTY;  Surgeon: Meredith Pel, MD;  Location: [place];  Service:  Orthopedics;  Laterality: Right;   TOTAL KNEE ARTHROPLASTY Right 02/28/2015   Procedure: RIGHT TOTAL KNEE ARTHROPLASTY;  Surgeon: [name], MD;  [facility];  Service: Orthopedics;  Laterality: Right;   TUBAL LIGATION      Social History:  reports that she has never smoked. She has never used smokeless tobacco. She reports that she does not drink alcohol and does not use drugs. Family History:  Family History  Problem Relation Age of Onset   Hypothyroidism Mother    Diabetes Father    Bone cancer Paternal Grandmother    Heart failure Paternal Grandmother    Cancer Maternal Grandmother    Heart failure Maternal Grandfather    Cancer Sister        GYN cancer   Hypothyroidism Sister      HOME MEDICATIONS: Allergies as of 08/24/2019      Reactions   Codeine Swelling   Influenza A (h1n1) Monoval Pf Anaphylaxis   Jardiance [empagliflozin] Other (See Comments)   dehydration      Medication List       Accurate as of August 24, 2019  8:39 AM. If you have any questions, ask your nurse or doctor.        acetaminophen 325 MG tablet Commonly known as: TYLENOL Take 650 mg by mouth every 6 (six) hours as needed for mild pain.   anastrozole 1 MG tablet Commonly known as: ARIMIDEX Take 1 tablet (1 mg total) by mouth daily.   citalopram 40 MG tablet Commonly known as: CELEXA Take 40 mg by mouth daily.   Farxiga 10 MG Tabs tablet Generic drug: dapagliflozin propanediol Take 10 mg by mouth daily before breakfast.   fluticasone 50 MCG/ACT nasal spray Commonly known as: FLONASE Place 1 spray into both nostrils as needed.   gabapentin 400 MG capsule Commonly known as: NEURONTIN Take 400-800 mg by mouth See admin instructions. Takes one capsule (400mg ) in the morning, AND afternoon. And two capsules (800mg ) at bedtime   Lantus 100 UNIT/ML injection Generic drug: insulin glargine Inject 50 Units into the skin at bedtime.   levothyroxine 137 MCG  tablet Commonly known as: SYNTHROID Take 1 tablet (137 mcg total) by mouth daily before breakfast.   losartan 25 MG tablet Commonly known as: COZAAR Take 1 tablet (25 mg total) by mouth 2 (two) times daily.   methocarbamol 500 MG tablet Commonly known as: ROBAXIN Take 1 tablet (500 mg total) by mouth every 8 (eight) hours as needed for muscle spasms.   multivitamin with minerals tablet Take 1 tablet by mouth daily.   Tart Cherry Advanced Caps Take 1 capsule by mouth daily.   TURMERIC PO Take by mouth daily.   Ubrogepant 50 MG Tabs Commonly known as: Ubrelvy Take 50 mg by mouth as needed (Greif repeat in 2 hours, for acute migraine headache.).   Victoza 18 MG/3ML Sopn  Generic drug: liraglutide Inject 1.8 mg into the skin daily.   VITAMIN B-12 PO Take by mouth daily.   VITAMIN B-6 PO Take by mouth daily.   VITAMIN C PO Take by mouth daily.   VITAMIN D PO Take by mouth daily.        OBJECTIVE:   Vital Signs: BP (!) 122/62 (BP Location: Left Arm, Patient Position: Sitting, Cuff Size: Large)    Pulse 91    Ht 5\' 1"  (1.549 m)    Wt (!) 277 lb 12.8 oz (126 kg)    SpO2 97%    BMI 52.49 kg/m   Wt Readings from Last 3 Encounters:  08/24/19 (!) 277 lb 12.8 oz (126 kg)  05/21/19 278 lb (126.1 kg)  03/30/19 276 lb 9.6 oz (125.5 kg)     Exam: General: Pt appears well and is in NAD  Lungs: Clear with good BS bilat with no rales, rhonchi, or wheezes  Heart: RRR , + systolic murmur  Extremities: No pretibial edema. No tremor.   Neuro: MS is good with appropriate affect, pt is alert and Ox3    DM foot exam:05/21/2019 The skin of the feet is intact without sores or ulcerations., Left big toe bruise The pedal pulses are 2+ on right and 2+ on left. The sensation is absent to a screening 5.07, 10 gram monofilament bilaterally     DATA REVIEWED:  Lab Results  Component Value Date   HGBA1C 7.2 (A) 08/24/2019   HGBA1C 7.8 (A) 08/21/2018   HGBA1C 7.1 (A) 05/15/2018    Lab Results  Component Value Date   CREATININE 0.96 11/07/2018        05/07/2019 A1c 8.4% BUN/Cr 23/0.910  MA/Cr ratio 17.9   Results for YUKTHA, KERCHNER (MRN [id number]) as of 08/27/2019 12:26  Ref. Range 08/24/2019 08:56  Sodium Latest Ref Range: 135 - 145 mEq/L 137  Potassium Latest Ref Range: 3.5 - 5.1 mEq/L 3.8  Chloride Latest Ref Range: 96 - 112 mEq/L 102  CO2 Latest Ref Range: 19 - 32 mEq/L 23  Glucose Latest Ref Range: 70 - 99 mg/dL 325 (H)  BUN Latest Ref Range: 6 - 23 mg/dL 17  Creatinine Latest Ref Range: 0.40 - 1.20 mg/dL 1.08  Calcium Latest Ref Range: 8.4 - 10.5 mg/dL 10.1  GFR Latest Ref Range: >60.00 mL/min 50.27 (L)  TSH Latest Ref Range: 0.35 - 4.50 uIU/mL 1.91  T4,Free(Direct) Latest Ref Range: 0.60 - 1.60 ng/dL 1.12    ASSESSMENT / PLAN / RECOMMENDATIONS:   1) Type 2 Diabetes Mellitus, Optimally controlled, With neuropathic complications - Most recent A1c of 7.2 %. Goal A1c < 7.0 %.     - Improved glycemic control  - Minor adjustments needed  MEDICATIONS:  Increase  Lantus 55 units daily  Continue Victoza 1.8 mg daily  Continue  Farxiga 10 mg daily   EDUCATION / INSTRUCTIONS:  BG monitoring instructions: Patient is instructed to check her blood sugars 2 times a day, fasting and bedtime.  Call [place] [facility] if: BG persistently < 70 or > 300.  I reviewed the Rule of 15 for the treatment of hypoglycemia in detail with the patient. Literature supplied.      2)Hypothyroidism :   - Pt is clinically and biochemically euthyroid  - Pt educated extensively on the correct way to take levothyroxine (first thing in the morning with water, 30 minutes before eating or taking other medications). - Pt encouraged to double dose the following day if  she were to miss a dose given long half-life of levothyroxine.  Medications Continue Levothyroxine to 137 mcg daily      F/U in 6 months    Signed electronically by: Mack Guise, MD  [facility] End[facility]  [facility] [address] [place], [place] [address] Phone: [phone] FAX: [phone]   CC: Leeroy Cha, [place] [address] [place] [address] Phone: [phone]  Fax: [phone]  Return to Endocrinology clinic as below: Future Appointments  Date Time Provider [place]  12/18/2019 11:00 AM [facility] 1 [facility] None  12/18/2019 11:30 AM Magrinat, Virgie Dad, MD [facility] None

## 2019-09-06 ENCOUNTER — Ambulatory Visit (INDEPENDENT_AMBULATORY_CARE_PROVIDER_SITE_OTHER): Payer: Medicare Other | Admitting: Physician Assistant

## 2019-09-06 ENCOUNTER — Ambulatory Visit (INDEPENDENT_AMBULATORY_CARE_PROVIDER_SITE_OTHER): Payer: Medicare Other

## 2019-09-06 ENCOUNTER — Encounter: Payer: Self-pay | Admitting: Physician Assistant

## 2019-09-06 DIAGNOSIS — M25571 Pain in right ankle and joints of right foot: Secondary | ICD-10-CM

## 2019-09-06 DIAGNOSIS — M19071 Primary osteoarthritis, right ankle and foot: Secondary | ICD-10-CM

## 2019-09-06 NOTE — Progress Notes (Signed)
Office Visit Note   Patient: [name] [name]           Date of Birth: December 09, 1950           MRN: [id number] Visit Date: 09/06/2019              Requested by: Leeroy Cha, MD [address] [place],  [place] [address] PCP: Leeroy Cha, MD   Assessment & Plan: Visit Diagnoses:  1. Pain in right ankle and joints of right foot   2. Primary osteoarthritis of right ankle     Plan: Recommend for the time being that she can use a hightop shoe or her ASO brace which she has at home.  She has tried a double upright brace but states that the metal rubs her leg.  Will refer her to foot and ankle specialist for consideration of right ankle fusion versus total ankle arthroplasty.  Questions were encouraged and answered at length with the patient and her husband were present today.  Follow-Up Instructions: Return if symptoms worsen or fail to improve.   Orders:  Orders Placed This Encounter  Procedures  . XR Ankle Complete Right   No orders of the defined types were placed in this encounter.     Procedures: No procedures performed   Clinical Data: No additional findings.   Subjective: Chief Complaint  Patient presents with  . Right Ankle - Pain    HPI [name] [name] comes in today with increased right ankle pain.  She unfortunately tripped and twisted her ankle yesterday Mr. Mechanical fall.  7 constant achy stabbing pain in the ankle.  She ambulates with a cane.  She is tried ASO brace and also a double upright brace in the past.  Today she is wearing his pair of flat loafers.  She has been able to bear weight on the ankle since fall yesterday. Review of Systems Please see HPI otherwise negative.  Objective: Vital Signs: There were no vitals taken for this visit.  Physical Exam Constitutional:      Appearance: She is not ill-appearing or diaphoretic.  Cardiovascular:     Pulses: Normal pulses.  Pulmonary:     Effort: Pulmonary effort is normal.    Neurological:     Mental Status: She is alert and oriented to person, place, and time.  Psychiatric:        Mood and Affect: Mood normal.     Ortho Exam Right ankle she has decreased dorsiflexion plantarflexion.  Positive crepitus with dorsiflexion plantarflexion.  Tenderness mostly globally.  No ecchymosis impending ulcers or lacerations.  Edema over the lateral malleolus.  Specialty Comments:  No specialty comments available.  Imaging: XR Ankle Complete Right  Result Date: 09/06/2019 Right ankle 3 views: No acute findings.  Severe degenerative changes of the right ankle with bone-on-bone arthritis.  Osteophytes about the medial lateral malleolus.  Os trigonum Haglund's deformity and plantar heel spur all present.  Degenerative changes of the tibiotalar joint definitely progressed since films were taken in February of this year.    PMFS History: Patient Active Problem List   Diagnosis Date Noted  . Acquired hypothyroidism 05/21/2019  . Hepatic steatosis 11/07/2018  . Arthritis of shoulder 09/07/2018  . Type 2 diabetes mellitus with diabetic polyneuropathy, with long-term current use of insulin ([place]) 11/16/2016  . Anxiety and depression 09/01/2016  . Bilateral lower extremity edema 09/01/2016  . Hypothyroidism 09/01/2016  . Diabetic polyneuropathy associated with type 2 diabetes mellitus ([place]) 06/03/2016  .  Achilles tendon contracture, bilateral 06/03/2016  . Malignant neoplasm of upper-outer quadrant of left breast in female, estrogen receptor positive ([place]) 08/19/2015  . Discharge from left nipple 08/03/2015  . Abnormal mammogram of left breast 08/03/2015  . Osteoarthritis of right knee 02/28/2015  . Status post total right knee replacement 02/28/2015  . Low TSH level 08/02/2014  . Splenomegaly 07/30/2014  . Thrombocytopenia ([place]) 07/30/2014  . 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 Recurrent falls 01/12/2013  . Neuropathy, diabetic ([place]) 01/12/2013  . Morbid obesity  ([place]) 01/12/2013  . Degenerative arthritis of spine 01/12/2013  . Knee pain 01/12/2013  . Cough 09/05/2012  . HBP (high blood pressure) 09/05/2012  . Chest wall contusion 12/09/2011  . Abdominal wall contusion 12/09/2011   Past Medical History:  Diagnosis Date  . Anxiety   . Arthritis   . Blood dyscrasia    low platelet count- sees [name] , [name] ( Note in [place] from 07/2014) at [facility]  . Breast cancer [facility]) 2017   Left Breast  . Cancer ([place])    left breast  . Cirrhosis of liver ([place])   . Cough   . Diabetes mellitus without complication ([place])   . Diabetic neuropathy ([place])   . Gout   . Gout   . History of radiation therapy 10/22/15 - 12/08/15   left breast 50.4 Gy, boost to 10 Gy  . Hypertension   . Migraine   . 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 Neuromuscular disorder ([place])    diabetic neuropathy  . Neuropathy   . Personal history of radiation therapy 2017   Left Breast Cancer  . Sleep apnea    CPAP nightly  . Spleen enlarged   . Thyroid disease     Family History  Problem Relation Age of Onset  . Hypothyroidism Mother   . Diabetes Father   . Bone cancer Paternal Grandmother   . Heart failure Paternal Grandmother   . Cancer Maternal Grandmother   . Heart failure Maternal Grandfather   . Cancer Sister        GYN cancer  . Hypothyroidism Sister     Past Surgical History:  Procedure Laterality Date  . BREAST BIOPSY Left 08/04/2015   Procedure: LEFT BREAST BIOPSY WITH NEEDLE LOCALIZATION;  Surgeon: [name], MD;  Location: [place];  Service: General;  Laterality: Left;  . BREAST DUCTAL SYSTEM EXCISION Left 08/04/2015   Procedure: LEFT EXCISION DUCTAL SYSTEM BREAST;  Surgeon: [name], MD;  Location: [place];  Service: General;  Laterality: Left;  . BREAST LUMPECTOMY Left 08/2015  . BREAST LUMPECTOMY WITH AXILLARY LYMPH NODE BIOPSY Left 09/12/2015   Procedure: RE-EXCISION OF LEFT BREAST  LUMPECTOMY WITH LEFT AXILLARY LYMPH NODE BIOPSY;  Surgeon: [name], MD;  Location: [place];  Service: General;  Laterality: Left;  . CHOLECYSTECTOMY    . JOINT REPLACEMENT     left knee  . JOINT REPLACEMENT  2017   right knee  . REVERSE SHOULDER ARTHROPLASTY Right 09/07/2018   Procedure: RIGHT REVERSE SHOULDER ARTHROPLASTY;  Surgeon: Meredith Pel, MD;  Location: [place];  Service: Orthopedics;  Laterality: Right;  . TOTAL KNEE ARTHROPLASTY Right 02/28/2015   Procedure: RIGHT TOTAL KNEE ARTHROPLASTY;  Surgeon: [name], MD;  [facility];  Service: Orthopedics;  Laterality: Right;  . TUBAL LIGATION     Social History   Occupational History  . Occupation: Retired    Comment: [employer]  Tobacco Use  . Smoking status: Never Smoker  . Smokeless tobacco: Never Used  Vaping Use  . Vaping Use: Never used  Substance and Sexual Activity  . Alcohol use: No  . Drug use: No  . Sexual activity: Not on file

## 2019-09-07 ENCOUNTER — Other Ambulatory Visit: Payer: Self-pay | Admitting: Radiology

## 2019-09-07 DIAGNOSIS — M19071 Primary osteoarthritis, right ankle and foot: Secondary | ICD-10-CM

## 2019-09-26 DIAGNOSIS — G4733 Obstructive sleep apnea (adult) (pediatric): Secondary | ICD-10-CM | POA: Diagnosis not present

## 2019-09-26 DIAGNOSIS — R040 Epistaxis: Secondary | ICD-10-CM | POA: Diagnosis not present

## 2019-10-01 ENCOUNTER — Other Ambulatory Visit: Payer: Self-pay

## 2019-10-01 MED ORDER — LEVOTHYROXINE SODIUM 137 MCG PO TABS: 137.0000 ug | ORAL_TABLET | Freq: Every day | ORAL | 1 refills | Status: DC

## 2019-10-03 ENCOUNTER — Other Ambulatory Visit: Payer: Self-pay | Admitting: *Deleted

## 2019-10-09 DIAGNOSIS — M19071 Primary osteoarthritis, right ankle and foot: Secondary | ICD-10-CM | POA: Insufficient documentation

## 2019-10-09 DIAGNOSIS — M2141 Flat foot [pes planus] (acquired), right foot: Secondary | ICD-10-CM | POA: Diagnosis not present

## 2019-10-09 DIAGNOSIS — M25571 Pain in right ankle and joints of right foot: Secondary | ICD-10-CM | POA: Diagnosis not present

## 2019-10-09 DIAGNOSIS — M21861 Other specified acquired deformities of right lower leg: Secondary | ICD-10-CM | POA: Diagnosis not present

## 2019-10-09 DIAGNOSIS — M76821 Posterior tibial tendinitis, right leg: Secondary | ICD-10-CM | POA: Insufficient documentation

## 2019-10-09 DIAGNOSIS — M7731 Calcaneal spur, right foot: Secondary | ICD-10-CM | POA: Diagnosis not present

## 2019-10-09 HISTORY — DX: Posterior tibial tendinitis, right leg: M76.821

## 2019-10-12 DIAGNOSIS — Z20822 Contact with and (suspected) exposure to covid-19: Secondary | ICD-10-CM | POA: Diagnosis not present

## 2019-10-12 DIAGNOSIS — D696 Thrombocytopenia, unspecified: Secondary | ICD-10-CM | POA: Diagnosis not present

## 2019-10-12 DIAGNOSIS — M19071 Primary osteoarthritis, right ankle and foot: Secondary | ICD-10-CM | POA: Diagnosis not present

## 2019-10-12 DIAGNOSIS — Z01812 Encounter for preprocedural laboratory examination: Secondary | ICD-10-CM | POA: Diagnosis not present

## 2019-10-17 DIAGNOSIS — D696 Thrombocytopenia, unspecified: Secondary | ICD-10-CM | POA: Diagnosis not present

## 2019-10-17 DIAGNOSIS — I10 Essential (primary) hypertension: Secondary | ICD-10-CM | POA: Diagnosis not present

## 2019-10-17 DIAGNOSIS — Z6841 Body Mass Index (BMI) 40.0 and over, adult: Secondary | ICD-10-CM | POA: Diagnosis not present

## 2019-10-17 DIAGNOSIS — M19071 Primary osteoarthritis, right ankle and foot: Secondary | ICD-10-CM | POA: Diagnosis not present

## 2019-10-17 DIAGNOSIS — Z9989 Dependence on other enabling machines and devices: Secondary | ICD-10-CM | POA: Diagnosis not present

## 2019-10-17 DIAGNOSIS — Z794 Long term (current) use of insulin: Secondary | ICD-10-CM | POA: Diagnosis not present

## 2019-10-17 DIAGNOSIS — M21861 Other specified acquired deformities of right lower leg: Secondary | ICD-10-CM | POA: Diagnosis not present

## 2019-10-17 DIAGNOSIS — M76821 Posterior tibial tendinitis, right leg: Secondary | ICD-10-CM | POA: Diagnosis not present

## 2019-10-17 DIAGNOSIS — G43909 Migraine, unspecified, not intractable, without status migrainosus: Secondary | ICD-10-CM | POA: Diagnosis not present

## 2019-10-17 DIAGNOSIS — G8918 Other acute postprocedural pain: Secondary | ICD-10-CM | POA: Diagnosis not present

## 2019-10-17 DIAGNOSIS — G8929 Other chronic pain: Secondary | ICD-10-CM | POA: Diagnosis not present

## 2019-10-17 DIAGNOSIS — Z79899 Other long term (current) drug therapy: Secondary | ICD-10-CM | POA: Diagnosis not present

## 2019-10-17 DIAGNOSIS — R42 Dizziness and giddiness: Secondary | ICD-10-CM | POA: Diagnosis not present

## 2019-10-17 DIAGNOSIS — G4733 Obstructive sleep apnea (adult) (pediatric): Secondary | ICD-10-CM | POA: Diagnosis not present

## 2019-10-17 DIAGNOSIS — E119 Type 2 diabetes mellitus without complications: Secondary | ICD-10-CM | POA: Diagnosis not present

## 2019-10-17 DIAGNOSIS — M25579 Pain in unspecified ankle and joints of unspecified foot: Secondary | ICD-10-CM | POA: Diagnosis not present

## 2019-10-17 DIAGNOSIS — M2141 Flat foot [pes planus] (acquired), right foot: Secondary | ICD-10-CM | POA: Diagnosis not present

## 2019-10-17 DIAGNOSIS — E039 Hypothyroidism, unspecified: Secondary | ICD-10-CM | POA: Diagnosis not present

## 2019-10-18 DIAGNOSIS — M2141 Flat foot [pes planus] (acquired), right foot: Secondary | ICD-10-CM | POA: Diagnosis not present

## 2019-10-18 DIAGNOSIS — I10 Essential (primary) hypertension: Secondary | ICD-10-CM | POA: Diagnosis not present

## 2019-10-18 DIAGNOSIS — Z4789 Encounter for other orthopedic aftercare: Secondary | ICD-10-CM | POA: Diagnosis not present

## 2019-10-18 DIAGNOSIS — Z79899 Other long term (current) drug therapy: Secondary | ICD-10-CM | POA: Diagnosis not present

## 2019-10-18 DIAGNOSIS — M21861 Other specified acquired deformities of right lower leg: Secondary | ICD-10-CM | POA: Diagnosis not present

## 2019-10-18 DIAGNOSIS — M19071 Primary osteoarthritis, right ankle and foot: Secondary | ICD-10-CM | POA: Diagnosis not present

## 2019-10-18 DIAGNOSIS — M76821 Posterior tibial tendinitis, right leg: Secondary | ICD-10-CM | POA: Diagnosis not present

## 2019-10-18 DIAGNOSIS — Z981 Arthrodesis status: Secondary | ICD-10-CM | POA: Diagnosis not present

## 2019-11-07 DIAGNOSIS — M19071 Primary osteoarthritis, right ankle and foot: Secondary | ICD-10-CM | POA: Diagnosis not present

## 2019-11-29 DIAGNOSIS — Z981 Arthrodesis status: Secondary | ICD-10-CM | POA: Diagnosis not present

## 2019-11-29 DIAGNOSIS — M7731 Calcaneal spur, right foot: Secondary | ICD-10-CM | POA: Diagnosis not present

## 2019-11-29 DIAGNOSIS — Z4789 Encounter for other orthopedic aftercare: Secondary | ICD-10-CM | POA: Diagnosis not present

## 2019-11-29 DIAGNOSIS — M19071 Primary osteoarthritis, right ankle and foot: Secondary | ICD-10-CM | POA: Diagnosis not present

## 2019-11-29 DIAGNOSIS — Z9889 Other specified postprocedural states: Secondary | ICD-10-CM | POA: Diagnosis not present

## 2019-11-29 DIAGNOSIS — Z7982 Long term (current) use of aspirin: Secondary | ICD-10-CM | POA: Diagnosis not present

## 2019-12-03 ENCOUNTER — Other Ambulatory Visit: Payer: Self-pay | Admitting: Oncology

## 2019-12-06 DIAGNOSIS — Z888 Allergy status to other drugs, medicaments and biological substances status: Secondary | ICD-10-CM | POA: Diagnosis not present

## 2019-12-06 DIAGNOSIS — Z887 Allergy status to serum and vaccine status: Secondary | ICD-10-CM | POA: Diagnosis not present

## 2019-12-06 DIAGNOSIS — Z4789 Encounter for other orthopedic aftercare: Secondary | ICD-10-CM | POA: Diagnosis not present

## 2019-12-06 DIAGNOSIS — Z885 Allergy status to narcotic agent status: Secondary | ICD-10-CM | POA: Diagnosis not present

## 2019-12-12 DIAGNOSIS — L03317 Cellulitis of buttock: Secondary | ICD-10-CM | POA: Diagnosis not present

## 2019-12-12 DIAGNOSIS — L0231 Cutaneous abscess of buttock: Secondary | ICD-10-CM | POA: Diagnosis not present

## 2019-12-17 ENCOUNTER — Telehealth: Payer: Self-pay

## 2019-12-17 ENCOUNTER — Other Ambulatory Visit: Payer: Self-pay | Admitting: Oncology

## 2019-12-17 DIAGNOSIS — Z9889 Other specified postprocedural states: Secondary | ICD-10-CM

## 2019-12-17 NOTE — Telephone Encounter (Signed)
Pt called stating she is in a cast and asked if this would be a problem for exam with Dr Jana Hakim. This LPN told her the cast would not create a problem. Pt also states she was not able to schedule MM with [facility] until 11/23 and asked if she would need to r/s her appt for 11/16 with Dr Jana Hakim. Pt's appt will be changed to 11/23 after her MM. Message sent to scheduling. Pt is in agreement with this plan and verbalizes thanks.

## 2019-12-18 ENCOUNTER — Inpatient Hospital Stay: Payer: Medicare Other

## 2019-12-18 ENCOUNTER — Inpatient Hospital Stay: Payer: Medicare Other | Admitting: Oncology
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
## 2019-12-25 ENCOUNTER — Inpatient Hospital Stay: Payer: Medicare Other | Attending: Oncology

## 2019-12-25 ENCOUNTER — Other Ambulatory Visit: Payer: Self-pay

## 2019-12-25 ENCOUNTER — Inpatient Hospital Stay (HOSPITAL_BASED_OUTPATIENT_CLINIC_OR_DEPARTMENT_OTHER): Payer: Medicare Other | Admitting: Oncology

## 2019-12-25 ENCOUNTER — Ambulatory Visit
Admission: RE | Admit: 2019-12-25 | Discharge: 2019-12-25 | Disposition: A | Discharge status: Home / Self Care | Payer: Medicare Other | Source: Ambulatory Visit | Attending: Oncology | Admitting: Oncology

## 2019-12-25 VITALS — BP 128/56 | HR 75 | Temp 98.2°F | Resp 18 | Ht 61.0 in

## 2019-12-25 DIAGNOSIS — E1142 Type 2 diabetes mellitus with diabetic polyneuropathy: Secondary | ICD-10-CM

## 2019-12-25 DIAGNOSIS — Z8049 Family history of malignant neoplasm of other genital organs: Secondary | ICD-10-CM | POA: Diagnosis not present

## 2019-12-25 DIAGNOSIS — D696 Thrombocytopenia, unspecified: Secondary | ICD-10-CM

## 2019-12-25 DIAGNOSIS — Z833 Family history of diabetes mellitus: Secondary | ICD-10-CM | POA: Insufficient documentation

## 2019-12-25 DIAGNOSIS — C50412 Malignant neoplasm of upper-outer quadrant of left female breast: Secondary | ICD-10-CM | POA: Insufficient documentation

## 2019-12-25 DIAGNOSIS — Z170 Estrogen receptor positive status [ER+]: Secondary | ICD-10-CM

## 2019-12-25 DIAGNOSIS — Z794 Long term (current) use of insulin: Secondary | ICD-10-CM | POA: Diagnosis not present

## 2019-12-25 DIAGNOSIS — Z79811 Long term (current) use of aromatase inhibitors: Secondary | ICD-10-CM | POA: Insufficient documentation

## 2019-12-25 DIAGNOSIS — F419 Anxiety disorder, unspecified: Secondary | ICD-10-CM | POA: Insufficient documentation

## 2019-12-25 DIAGNOSIS — Z9889 Other specified postprocedural states: Secondary | ICD-10-CM

## 2019-12-25 DIAGNOSIS — M109 Gout, unspecified: Secondary | ICD-10-CM | POA: Diagnosis not present

## 2019-12-25 DIAGNOSIS — E079 Disorder of thyroid, unspecified: Secondary | ICD-10-CM | POA: Insufficient documentation

## 2019-12-25 DIAGNOSIS — Z923 Personal history of irradiation: Secondary | ICD-10-CM | POA: Diagnosis not present

## 2019-12-25 DIAGNOSIS — I10 Essential (primary) hypertension: Secondary | ICD-10-CM | POA: Diagnosis not present

## 2019-12-25 DIAGNOSIS — E1140 Type 2 diabetes mellitus with diabetic neuropathy, unspecified: Secondary | ICD-10-CM | POA: Diagnosis not present

## 2019-12-25 DIAGNOSIS — G4730 Sleep apnea, unspecified: Secondary | ICD-10-CM | POA: Diagnosis not present

## 2019-12-25 DIAGNOSIS — Z8249 Family history of ischemic heart disease and other diseases of the circulatory system: Secondary | ICD-10-CM | POA: Insufficient documentation

## 2019-12-25 DIAGNOSIS — Z90710 Acquired absence of both cervix and uterus: Secondary | ICD-10-CM | POA: Insufficient documentation

## 2019-12-25 DIAGNOSIS — Z8349 Family history of other endocrine, nutritional and metabolic diseases: Secondary | ICD-10-CM | POA: Insufficient documentation

## 2019-12-25 DIAGNOSIS — Z808 Family history of malignant neoplasm of other organs or systems: Secondary | ICD-10-CM | POA: Insufficient documentation

## 2019-12-25 DIAGNOSIS — Z79899 Other long term (current) drug therapy: Secondary | ICD-10-CM | POA: Insufficient documentation

## 2019-12-25 LAB — CBC WITH DIFFERENTIAL (CANCER CENTER ONLY)
Abs Immature Granulocytes: 0.03 10*3/uL (ref 0.00–0.07)
Basophils Absolute: 0 10*3/uL (ref 0.0–0.1)
Basophils Relative: 1 %
Eosinophils Absolute: 0.2 10*3/uL (ref 0.0–0.5)
Eosinophils Relative: 5 %
HCT: 35.3 % — ABNORMAL LOW (ref 36.0–46.0)
Hemoglobin: 11.3 g/dL — ABNORMAL LOW (ref 12.0–15.0)
Immature Granulocytes: 1 %
Lymphocytes Relative: 24 %
Lymphs Abs: 0.8 10*3/uL (ref 0.7–4.0)
MCH: 28.9 pg (ref 26.0–34.0)
MCHC: 32 g/dL (ref 30.0–36.0)
MCV: 90.3 fL (ref 80.0–100.0)
Monocytes Absolute: 0.2 10*3/uL (ref 0.1–1.0)
Monocytes Relative: 6 %
Neutro Abs: 2.2 10*3/uL (ref 1.7–7.7)
Neutrophils Relative %: 63 %
Platelet Count: 62 10*3/uL — ABNORMAL LOW (ref 150–400)
RBC: 3.91 MIL/uL (ref 3.87–5.11)
RDW: 15.1 % (ref 11.5–15.5)
WBC Count: 3.4 10*3/uL — ABNORMAL LOW (ref 4.0–10.5)
nRBC: 0 % (ref 0.0–0.2)

## 2019-12-25 LAB — CMP (CANCER CENTER ONLY)
ALT: 18 U/L (ref 0–44)
AST: 33 U/L (ref 15–41)
Albumin: 3.5 g/dL (ref 3.5–5.0)
Alkaline Phosphatase: 118 U/L (ref 38–126)
Anion gap: 9 (ref 5–15)
BUN: 15 mg/dL (ref 8–23)
CO2: 25 mmol/L (ref 22–32)
Calcium: 10 mg/dL (ref 8.9–10.3)
Chloride: 103 mmol/L (ref 98–111)
Creatinine: 1.19 mg/dL — ABNORMAL HIGH (ref 0.44–1.00)
GFR, Estimated: 49 mL/min — ABNORMAL LOW (ref >60)
Glucose, Bld: 372 mg/dL — ABNORMAL HIGH (ref 70–99)
Potassium: 4.1 mmol/L (ref 3.5–5.1)
Sodium: 137 mmol/L (ref 135–145)
Total Bilirubin: 0.4 mg/dL (ref 0.3–1.2)
Total Protein: 7.5 g/dL (ref 6.5–8.1)

## 2019-12-25 MED ORDER — ANASTROZOLE 1 MG PO TABS: 1.0000 mg | ORAL_TABLET | Freq: Every day | ORAL | 4 refills | Status: DC

## 2019-12-26 ENCOUNTER — Telehealth: Payer: Self-pay | Admitting: Oncology

## 2019-12-31 IMAGING — MG DIGITAL DIAGNOSTIC BILAT W/ TOMO W/ CAD
6 of 11 series · 6 of 31 positions shown · non-contrast
Comparison: Previous exam(s).

CLINICAL DATA: History of left breast cancer status post lumpectomy
in 2664.Patient recently had right shoulder surgery and was unable
to lift her right arm for the images, therefore limited views of the
right breast were obtained.

EXAM:
DIGITAL DIAGNOSTIC BILATERAL MAMMOGRAM WITH CAD AND TOMO

[L CC]
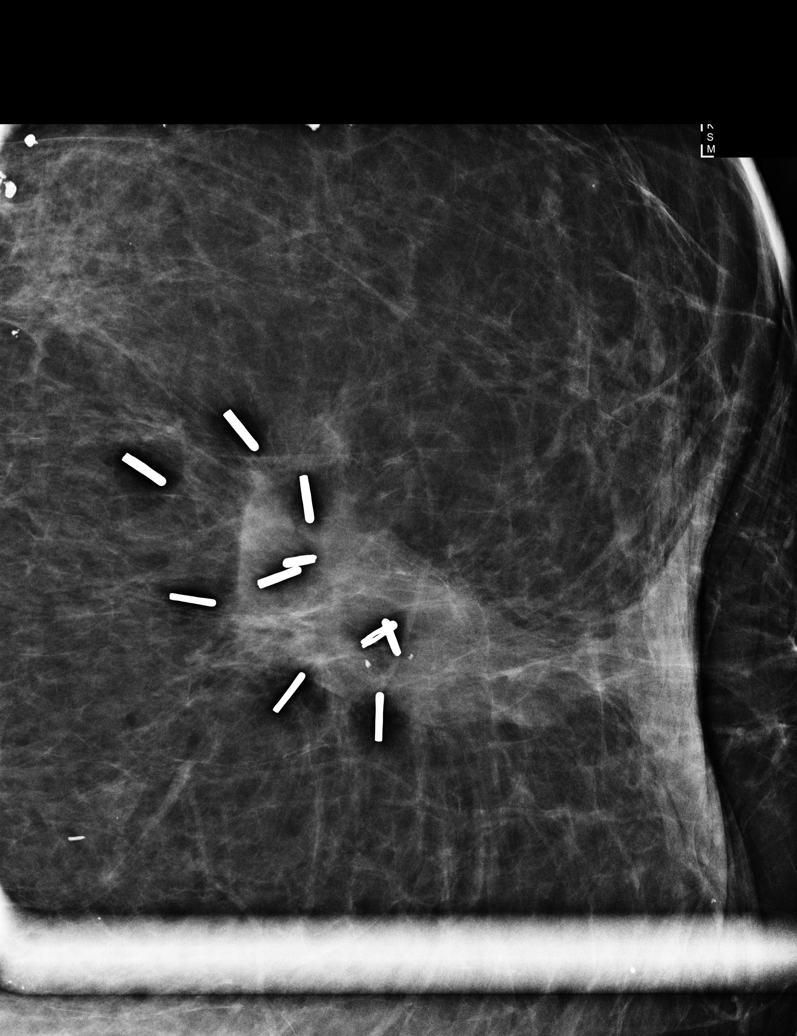

[R CV synth-2D]
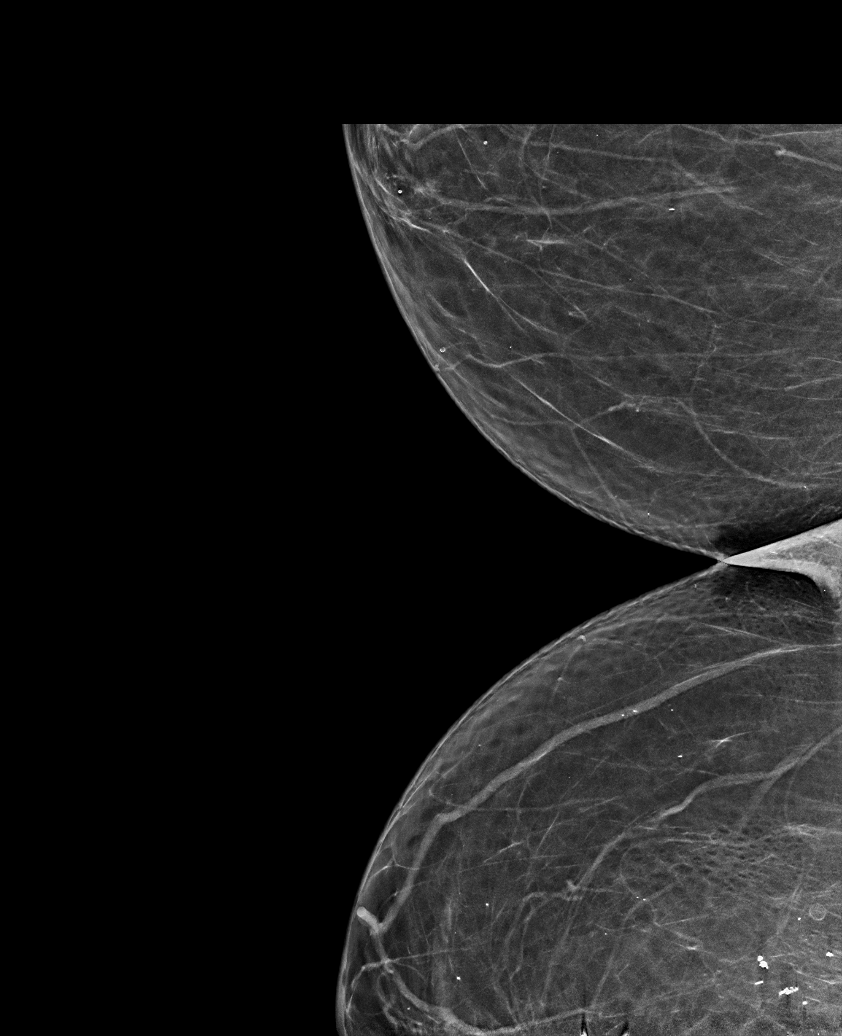

[L CC synth-2D]
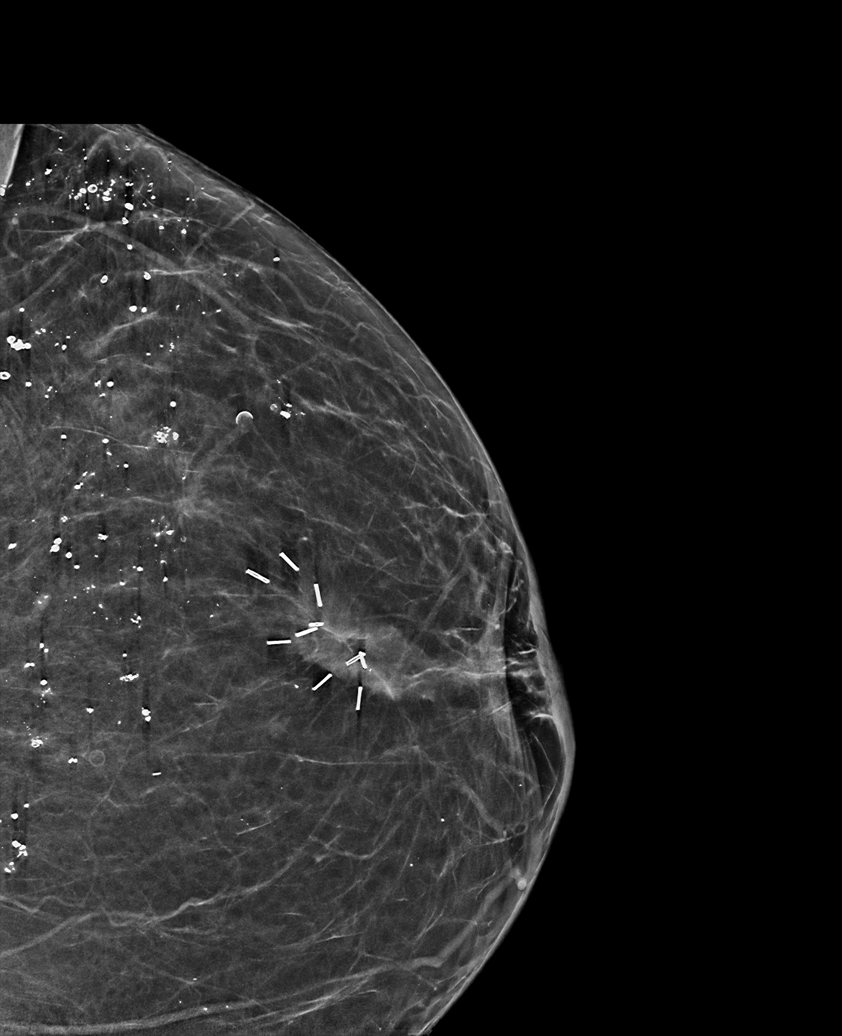

[R MLO synth-2D]
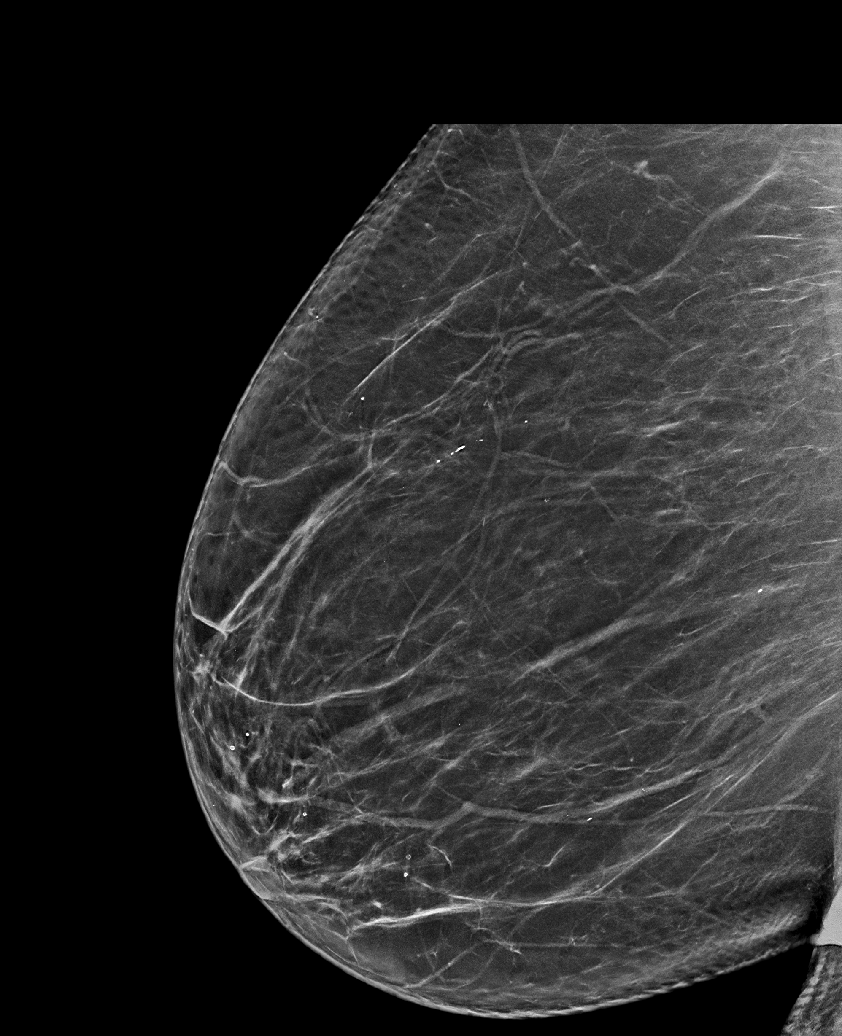

[L MLO synth-2D]
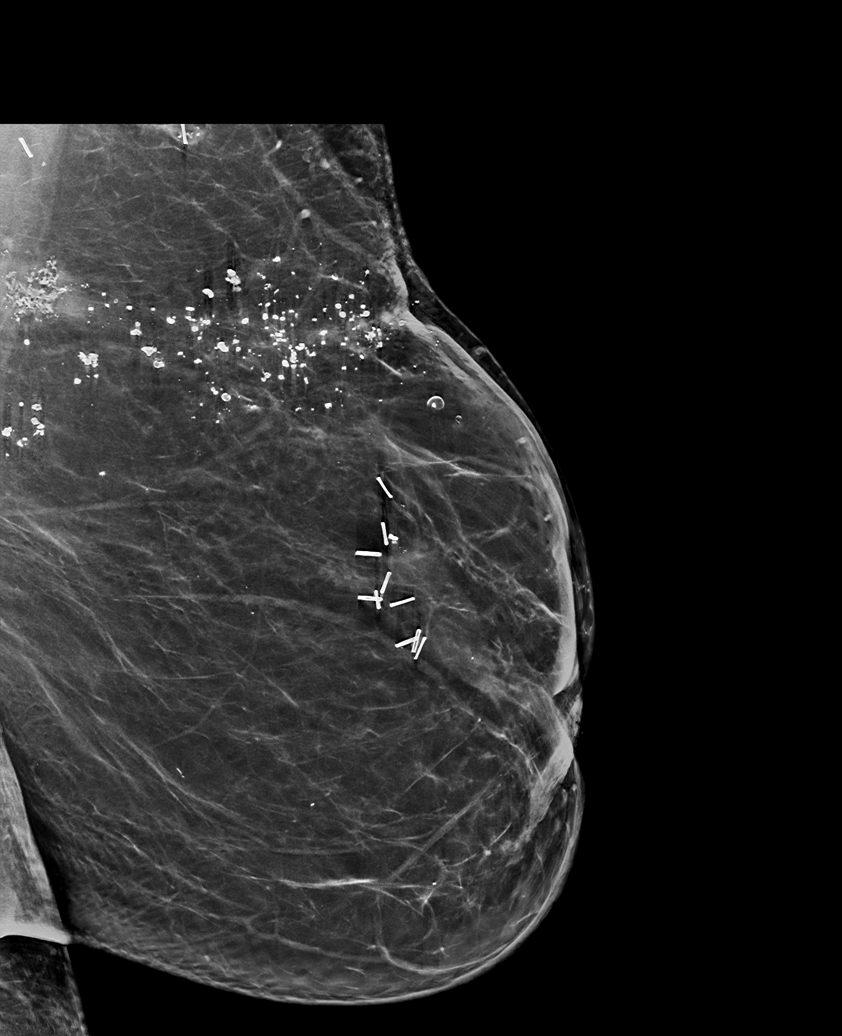

[R CC synth-2D]
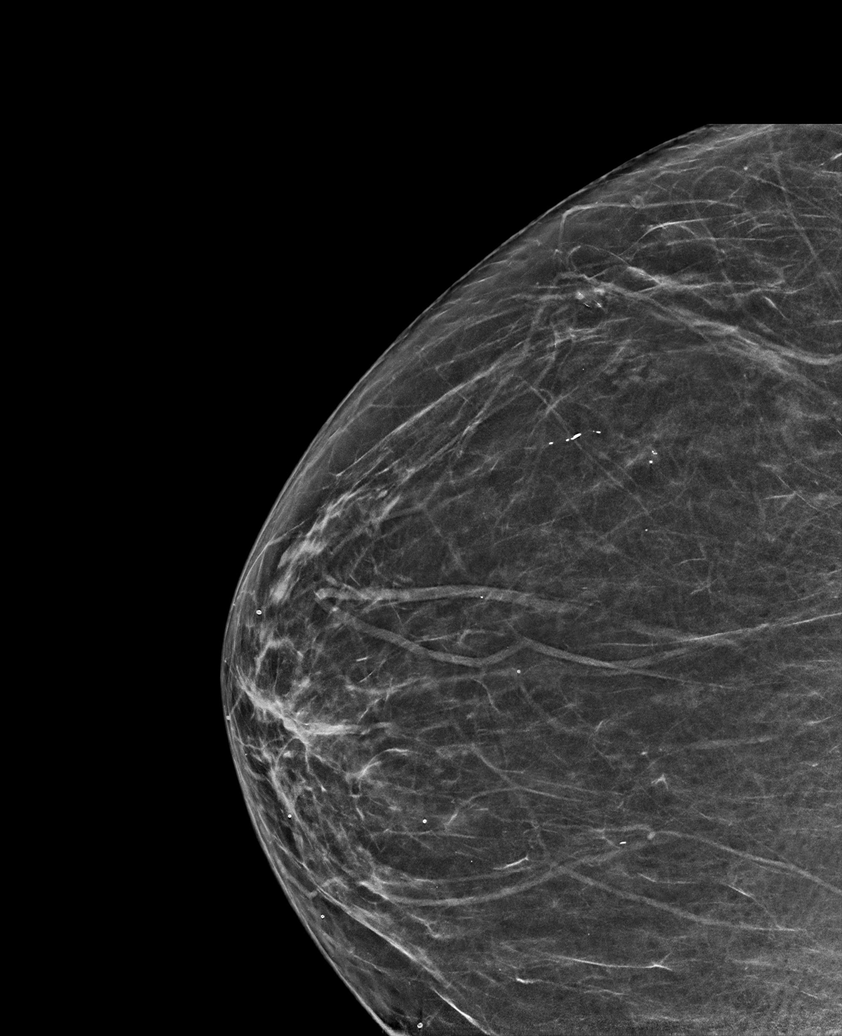

[6 of 31 positions shown; findings below may reference images not displayed]

ACR Breast Density Category b: There are scattered areas of
fibroglandular density.
FINDINGS: Lumpectomy changes are seen in the left breast. No suspicious mass
or malignant type microcalcifications identified in either breast.

Mammographic images were processed with CAD.
IMPRESSION: No evidence of malignancy in either breast.

RECOMMENDATION:
Bilateral diagnostic mammogram in 1 year is recommended.

I have discussed the findings and recommendations with the patient.
If applicable, a reminder letter will be sent to the patient
regarding the next appointment.

BI-RADS CATEGORY  2: Benign.

## 2020-01-10 DIAGNOSIS — Z4789 Encounter for other orthopedic aftercare: Secondary | ICD-10-CM | POA: Diagnosis not present

## 2020-01-10 DIAGNOSIS — M7731 Calcaneal spur, right foot: Secondary | ICD-10-CM | POA: Diagnosis not present

## 2020-01-10 DIAGNOSIS — M19071 Primary osteoarthritis, right ankle and foot: Secondary | ICD-10-CM | POA: Diagnosis not present

## 2020-01-10 DIAGNOSIS — I999 Unspecified disorder of circulatory system: Secondary | ICD-10-CM | POA: Diagnosis not present

## 2020-01-10 DIAGNOSIS — Z981 Arthrodesis status: Secondary | ICD-10-CM | POA: Diagnosis not present

## 2020-01-10 DIAGNOSIS — Z89441 Acquired absence of right ankle: Secondary | ICD-10-CM | POA: Diagnosis not present

## 2020-01-24 ENCOUNTER — Other Ambulatory Visit: Payer: Self-pay | Admitting: Gastroenterology

## 2020-01-24 ENCOUNTER — Other Ambulatory Visit (HOSPITAL_COMMUNITY): Payer: Self-pay | Admitting: Gastroenterology

## 2020-01-24 DIAGNOSIS — K7460 Unspecified cirrhosis of liver: Secondary | ICD-10-CM

## 2020-02-02 HISTORY — PX: EYE SURGERY: SHX253

## 2020-02-02 HISTORY — PX: ANKLE FUSION: SHX881

## 2020-02-04 ENCOUNTER — Other Ambulatory Visit: Payer: Self-pay

## 2020-02-04 ENCOUNTER — Ambulatory Visit (HOSPITAL_COMMUNITY)
Admission: RE | Admit: 2020-02-04 | Discharge: 2020-02-04 | Disposition: A | Discharge status: Home / Self Care | Payer: Medicare Other | Source: Ambulatory Visit | Attending: Gastroenterology | Admitting: Gastroenterology

## 2020-02-04 DIAGNOSIS — K7460 Unspecified cirrhosis of liver: Secondary | ICD-10-CM

## 2020-02-04 DIAGNOSIS — K7581 Nonalcoholic steatohepatitis (NASH): Secondary | ICD-10-CM | POA: Diagnosis not present

## 2020-02-04 DIAGNOSIS — K766 Portal hypertension: Secondary | ICD-10-CM | POA: Diagnosis not present

## 2020-02-04 DIAGNOSIS — R161 Splenomegaly, not elsewhere classified: Secondary | ICD-10-CM | POA: Diagnosis not present

## 2020-02-04 DIAGNOSIS — K760 Fatty (change of) liver, not elsewhere classified: Secondary | ICD-10-CM | POA: Diagnosis not present

## 2020-02-06 ENCOUNTER — Ambulatory Visit: Payer: Self-pay

## 2020-02-06 ENCOUNTER — Encounter: Payer: Self-pay | Admitting: Physician Assistant

## 2020-02-06 ENCOUNTER — Ambulatory Visit (INDEPENDENT_AMBULATORY_CARE_PROVIDER_SITE_OTHER): Payer: Medicare Other | Admitting: Physician Assistant

## 2020-02-06 DIAGNOSIS — M25572 Pain in left ankle and joints of left foot: Secondary | ICD-10-CM

## 2020-02-06 DIAGNOSIS — M25562 Pain in left knee: Secondary | ICD-10-CM | POA: Diagnosis not present

## 2020-02-06 NOTE — Progress Notes (Signed)
Office Visit Note   Patient: [name] [name]           Date of Birth: 1951-01-31           MRN: [id number] Visit Date: 02/06/2020              Requested by: Lorenda Ishihara, MD [address] [place],  [place] [address] PCP: Lorenda Ishihara, MD   Assessment & Plan: Visit Diagnoses:  1. Pain in left ankle and joints of left foot   2. Left knee pain, unspecified chronicity     Plan: Went over the radiographs with her and told her of the left knee could cause her pain due to contusion for up to 3 months.  In regards to the left ankle we will place her in an ASO brace.  She is to see Dr. Dr. Lorin Picket for follow-up of her right ankle in about a month.  She can discuss her left ankle if she still having pain in it at that point in time with him.  She will follow-up with [place] as needed.  Questions were encouraged and answered  Follow-Up Instructions: Return if symptoms worsen or fail to improve.   Orders:  Orders Placed This Encounter  Procedures  . XR Knee 1-2 Views Left  . XR Ankle Complete Left   No orders of the defined types were placed in this encounter.     Procedures: No procedures performed   Clinical Data: No additional findings.   Subjective: Chief Complaint  Patient presents with  . Left Knee - Pain  . Left Ankle - Pain    HPI Ms. Medero comes in today with left knee pain left ankle pain.  She fell on December 5 when she tried to step up a ramp and her knee gave out on her.  She fell on her left side.  She has a history of left total knee arthroplasty.  She continues to have pain about her knee and also in her left ankle.  She is recently underwent surgery by Dr. Lorin Picket, [facility] in [place] due to right subtalar joint arthritis pes planovalgus deformity.  She is still recovering from this and has an ASO brace on the right. Review of Systems Review of systems  see HPI  Objective: Vital Signs: There were no vitals taken for this  visit.  Physical Exam Constitutional:      Appearance: She is not ill-appearing or diaphoretic.  Neurological:     Mental Status: She is alert and oriented to person, place, and time.  Psychiatric:        Mood and Affect: Mood normal.     Ortho Exam Left knee full extension full flexion.  No instability valgus varus stressing.  She has tenderness over the anterior medial aspect of the knee.  There is no gross deformity effusion or erythema.  No abrasions to the knee. Left ankle she has good dorsiflexion plantarflexion.  Tenderness over the distal lateral malleolus only.  Pes planovalgus deformity. Specialty Comments:  No specialty comments available.  Imaging: XR Ankle Complete Left  Result Date: 02/06/2020 Left ankle 3 views: Talus well located within the ankle mortise.  There is narrowing of the ankle joint.  Complete loss of subtalar joint space due to arthritic changes.  Talonavicular changes.  No acute fractures.  Ossifications of the medial and lateral malleolus tips consistent with chronic changes.  Large calcaneal heel spur.  XR Knee 1-2 Views Left  Result Date: 02/06/2020 Left  knee 2 views: No acute fracture no acute findings.  Status post left total knee arthroplasty with well-seated components.    PMFS History: Patient Active Problem List   Diagnosis Date Noted  . Acquired hypothyroidism 05/21/2019  . Hepatic steatosis 11/07/2018  . Arthritis of shoulder 09/07/2018  . Type 2 diabetes mellitus with diabetic polyneuropathy, with long-term current use of insulin (HCC) 11/16/2016  . Anxiety and depression 09/01/2016  . Bilateral lower extremity edema 09/01/2016  . Hypothyroidism 09/01/2016  . Diabetic polyneuropathy associated with type 2 diabetes mellitus (HCC) 06/03/2016  . Achilles tendon contracture, bilateral 06/03/2016  . Malignant neoplasm of upper-outer quadrant of left breast in female, estrogen receptor positive (HCC) 08/19/2015  . Discharge from left nipple  08/03/2015  . Abnormal mammogram of left breast 08/03/2015  . Osteoarthritis of right knee 02/28/2015  . Status post total right knee replacement 02/28/2015  . Low TSH level 08/02/2014  . Splenomegaly 07/30/2014  . Thrombocytopenia (HCC) 07/30/2014  . 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 Recurrent falls 01/12/2013  . Neuropathy, diabetic (HCC) 01/12/2013  . Morbid obesity (HCC) 01/12/2013  . Degenerative arthritis of spine 01/12/2013  . Knee pain 01/12/2013  . Cough 09/05/2012  . HBP (high blood pressure) 09/05/2012  . Chest wall contusion 12/09/2011  . Abdominal wall contusion 12/09/2011   Past Medical History:  Diagnosis Date  . Anxiety   . Arthritis   . Blood dyscrasia    low platelet count- sees [name] , Dr. Nelva Nay ( Note in EPIC from 07/2014) at [facility]  . Breast cancer [facility]) 2017   Left Breast  . Cancer (HCC)    left breast  . Cirrhosis of liver (HCC)   . Cough   . Diabetes mellitus without complication (HCC)   . Diabetic neuropathy (HCC)   . Gout   . Gout   . History of radiation therapy 10/22/15 - 12/08/15   left breast 50.4 Gy, boost to 10 Gy  . Hypertension   . Migraine   . 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 Neuromuscular disorder (HCC)    diabetic neuropathy  . Neuropathy   . Personal history of radiation therapy 2017   Left Breast Cancer  . Sleep apnea    CPAP nightly  . Spleen enlarged   . Thyroid disease     Family History  Problem Relation Age of Onset  . Hypothyroidism Mother   . Diabetes Father   . Bone cancer Paternal Grandmother   . Heart failure Paternal Grandmother   . Cancer Maternal Grandmother   . Heart failure Maternal Grandfather   . Cancer Sister        GYN cancer  . Hypothyroidism Sister     Past Surgical History:  Procedure Laterality Date  . BREAST BIOPSY Left 08/04/2015   Procedure: LEFT BREAST BIOPSY WITH NEEDLE LOCALIZATION;  Surgeon: Darnell Level, MD;  Location: [facility];  Service: General;  Laterality: Left;  . BREAST DUCTAL SYSTEM EXCISION Left 08/04/2015   Procedure: LEFT EXCISION DUCTAL SYSTEM BREAST;  Surgeon: Darnell Level, MD;  Location: [facility];  Service: General;  Laterality: Left;  . BREAST LUMPECTOMY Left 08/2015  . BREAST LUMPECTOMY WITH AXILLARY LYMPH NODE BIOPSY Left 09/12/2015   Procedure: RE-EXCISION OF LEFT BREAST LUMPECTOMY WITH LEFT AXILLARY LYMPH NODE BIOPSY;  Surgeon: Darnell Level, MD;  Location: [facility];  Service: General;  Laterality: Left;  . CHOLECYSTECTOMY    . JOINT REPLACEMENT     left  knee  . JOINT REPLACEMENT  2017   right knee  . REVERSE SHOULDER ARTHROPLASTY Right 09/07/2018   Procedure: RIGHT REVERSE SHOULDER ARTHROPLASTY;  Surgeon: Cammy Copa, MD;  Location: [facility];  Service: Orthopedics;  Laterality: Right;  . TOTAL KNEE ARTHROPLASTY Right 02/28/2015   Procedure: RIGHT TOTAL KNEE ARTHROPLASTY;  Surgeon: Kathryne Hitch, MD;  [facility];  Service: Orthopedics;  Laterality: Right;  . TUBAL LIGATION     Social History   Occupational History  . Occupation: Retired    Comment: [employer]  Tobacco Use  . Smoking status: Never Smoker  . Smokeless tobacco: Never Used  Vaping Use  . Vaping Use: Never used  Substance and Sexual Activity  . Alcohol use: No  . Drug use: No  . Sexual activity: Not on file

## 2020-02-07 DIAGNOSIS — M25571 Pain in right ankle and joints of right foot: Secondary | ICD-10-CM | POA: Diagnosis not present

## 2020-02-07 DIAGNOSIS — Z4789 Encounter for other orthopedic aftercare: Secondary | ICD-10-CM | POA: Diagnosis not present

## 2020-02-15 DIAGNOSIS — J069 Acute upper respiratory infection, unspecified: Secondary | ICD-10-CM | POA: Diagnosis not present

## 2020-02-25 ENCOUNTER — Other Ambulatory Visit: Payer: Self-pay | Admitting: Internal Medicine

## 2020-02-25 ENCOUNTER — Ambulatory Visit
Admission: RE | Admit: 2020-02-25 | Discharge: 2020-02-25 | Disposition: A | Discharge status: Home / Self Care | Payer: Medicare Other | Source: Ambulatory Visit | Attending: Internal Medicine | Admitting: Internal Medicine

## 2020-02-25 DIAGNOSIS — J069 Acute upper respiratory infection, unspecified: Secondary | ICD-10-CM | POA: Diagnosis not present

## 2020-02-25 DIAGNOSIS — R059 Cough, unspecified: Secondary | ICD-10-CM | POA: Diagnosis not present

## 2020-02-27 ENCOUNTER — Other Ambulatory Visit: Payer: Self-pay

## 2020-02-29 ENCOUNTER — Ambulatory Visit (INDEPENDENT_AMBULATORY_CARE_PROVIDER_SITE_OTHER): Payer: Medicare Other | Admitting: Internal Medicine

## 2020-02-29 ENCOUNTER — Other Ambulatory Visit: Payer: Self-pay

## 2020-02-29 ENCOUNTER — Encounter: Payer: Self-pay | Admitting: Internal Medicine

## 2020-02-29 VITALS — BP 146/82 | HR 99 | Ht 61.0 in | Wt 280.5 lb

## 2020-02-29 DIAGNOSIS — E039 Hypothyroidism, unspecified: Secondary | ICD-10-CM | POA: Diagnosis not present

## 2020-02-29 DIAGNOSIS — E1142 Type 2 diabetes mellitus with diabetic polyneuropathy: Secondary | ICD-10-CM

## 2020-02-29 DIAGNOSIS — E1165 Type 2 diabetes mellitus with hyperglycemia: Secondary | ICD-10-CM

## 2020-02-29 DIAGNOSIS — Z794 Long term (current) use of insulin: Secondary | ICD-10-CM | POA: Diagnosis not present

## 2020-02-29 LAB — POCT GLUCOSE (DEVICE FOR HOME USE): POC Glucose: 316 mg/dl — AB (ref 70–99)

## 2020-02-29 LAB — POCT GLYCOSYLATED HEMOGLOBIN (HGB A1C): Hemoglobin A1C: 9.6 % — AB (ref 4.0–5.6)

## 2020-02-29 MED ORDER — BASAGLAR KWIKPEN 100 UNIT/ML ~~LOC~~ SOPN: 55.0000 [IU] | PEN_INJECTOR | Freq: Every day | SUBCUTANEOUS | 3 refills | Status: DC

## 2020-02-29 MED ORDER — DEXCOM G6 TRANSMITTER MISC: 1.0000 | 3 refills | Status: DC

## 2020-02-29 MED ORDER — DEXCOM G6 RECEIVER DEVI: 1.0000 | 0 refills | Status: DC

## 2020-02-29 MED ORDER — INSULIN PEN NEEDLE 30G X 5 MM MISC: 1.0000 | Freq: Four times a day (QID) | 3 refills | Status: DC

## 2020-02-29 MED ORDER — HUMALOG KWIKPEN 200 UNIT/ML ~~LOC~~ SOPN: 12.0000 [IU] | PEN_INJECTOR | Freq: Three times a day (TID) | SUBCUTANEOUS | 11 refills | Status: DC

## 2020-02-29 MED ORDER — DEXCOM G6 SENSOR MISC: 1.0000 | 3 refills | Status: DC

## 2020-02-29 NOTE — Patient Instructions (Addendum)
-   Continue Basaglar  55 units daily  - Continue Victoza 1.8 mg daily  - Continue  Farxig 10 mg daily  - Start Humalog 12 units with each meal   -  Continue Levothyroxine to 137 mcg daily    HOW TO TREAT LOW BLOOD SUGARS (Blood sugar LESS THAN 70 MG/DL)  Please follow the RULE OF 15 for the treatment of hypoglycemia treatment (when your (blood sugars are less than 70 mg/dL)    STEP 1: Take 15 grams of carbohydrates when your blood sugar is low, which includes:   3-4 GLUCOSE TABS  OR  3-4 OZ OF JUICE OR REGULAR SODA OR  ONE TUBE OF GLUCOSE GEL     STEP 2: RECHECK blood sugar in 15 MINUTES STEP 3: If your blood sugar is still low at the 15 minute recheck --> then, go back to STEP 1 and treat AGAIN with another 15 grams of carbohydrates.

## 2020-02-29 NOTE — Progress Notes (Signed)
Name: [name] [name]  Age/ Sex: 70 y.o., female   MRN/ DOB: [id number], July 11, 1950     PCP: Leeroy Cha, MD   Reason for Endocrinology Evaluation: Type 2 Diabetes Mellitus  Initial Endocrine Consultative Visit: 03/20/2018    PATIENT IDENTIFIER: [name] [name] [name] [name] is a 70 y.o. female with a past medical history of T2DM, hypothyroidism, HTN and Hx of breast Ca . The patient has followed with Endocrinology clinic since 03/20/2018 for consultative assistance with management of her diabetes.  DIABETIC HISTORY:  Ms. Popson was diagnosed with T2DM many years ago, she is intolerant to Metformin. She was started on Jardiance January, 2020 but due to dizziness this was stopped in February, 2020.She was continued on Victoza and Lantus at the time.  Her hemoglobin A1c has ranged from 6.6% in 2017, peaking at 8.5%  In 2020 Farxiga started 08/2018   Hypothyroidism: She was diagnosed with hypothyroidism many years ago. She has been on LT-4 replacement since her diagnosis. She takes it appropriately   SUBJECTIVE:   During the last visit (08/20/2019): A1c 7.2 %. Increased Lantus, and continued [place]  and Victoza   Today (02/29/2020): [name] [name] is here for a months  follow up on diabetes management.   She checks her blood sugars 3 times daily, preprandial to breakfast and bedtime. The patient has not had hypoglycemic episodes since the last clinic visit.    Had ankle surgery 11/2019  She is having cough and hoarseness but no recent fever, tested negative  For COVID   Takes levothyroxine as prescribe Denies constipation  Denies local neck swelling   HOME DIABETES REGIMEN:   Victoza 1.8 mg daily  Lantus 55  units daily  Farxiga 10 mg daily     METER DOWNLOAD SUMMARY Did not bring        HISTORY:  Past Medical History:  Past Medical History:  Diagnosis Date  . Anxiety   . Arthritis   . Blood dyscrasia    low platelet count- sees [name] , [name] ( Note in  [place] from 07/2014) at [facility]  . Breast cancer [facility]) 2017   Left Breast  . Cancer ([place])    left breast  . Cirrhosis of liver ([place])   . Cough   . Diabetes mellitus without complication ([place])   . Diabetic neuropathy ([place])   . Gout   . Gout   . History of radiation therapy 10/22/15 - 12/08/15   left breast 50.4 Gy, boost to 10 Gy  . Hypertension   . Migraine   . 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 Neuromuscular disorder ([place])    diabetic neuropathy  . Neuropathy   . Personal history of radiation therapy 2017   Left Breast Cancer  . Sleep apnea    CPAP nightly  . Spleen enlarged   . Thyroid disease    Past Surgical History:  Past Surgical History:  Procedure Laterality Date  . BREAST BIOPSY Left 08/04/2015   Procedure: LEFT BREAST BIOPSY WITH NEEDLE LOCALIZATION;  Surgeon: [name], MD;  Location: [place];  Service: General;  Laterality: Left;  . BREAST DUCTAL SYSTEM EXCISION Left 08/04/2015   Procedure: LEFT EXCISION DUCTAL SYSTEM BREAST;  Surgeon: [name], MD;  Location: [place];  Service: General;  Laterality: Left;  . BREAST LUMPECTOMY Left 08/2015  . BREAST LUMPECTOMY WITH AXILLARY LYMPH NODE BIOPSY Left 09/12/2015   Procedure: RE-EXCISION OF LEFT BREAST LUMPECTOMY WITH LEFT AXILLARY LYMPH NODE BIOPSY;  Surgeon: [name], MD;  Location: [place];  Service: General;  Laterality: Left;  . CHOLECYSTECTOMY    . JOINT REPLACEMENT     left knee  . JOINT REPLACEMENT  2017   right knee  . REVERSE SHOULDER ARTHROPLASTY Right 09/07/2018   Procedure: RIGHT REVERSE SHOULDER ARTHROPLASTY;  Surgeon: Meredith Pel, MD;  Location: [place];  Service: Orthopedics;  Laterality: Right;  . TOTAL KNEE ARTHROPLASTY Right 02/28/2015   Procedure: RIGHT TOTAL KNEE ARTHROPLASTY;  Surgeon: [name], MD;  [facility];  Service: Orthopedics;  Laterality: Right;  . TUBAL LIGATION      Social History:   reports that she has never smoked. She has never used smokeless tobacco. She reports that she does not drink alcohol and does not use drugs. Family History:  Family History  Problem Relation Age of Onset  . Hypothyroidism Mother   . Diabetes Father   . Bone cancer Paternal Grandmother   . Heart failure Paternal Grandmother   . Cancer Maternal Grandmother   . Heart failure Maternal Grandfather   . Cancer Sister        GYN cancer  . Hypothyroidism Sister      HOME MEDICATIONS: Allergies as of 02/29/2020      Reactions   Codeine Swelling   Influenza A (h1n1) Monoval Pf Anaphylaxis   Other Anaphylaxis   Does not know what this allergy agent could be; but denied she had any issues with Latex   Hydrocodone-acetaminophen Other (See Comments)   Lisinopril Other (See Comments)   Metformin Other (See Comments)   Pregabalin Other (See Comments)   Jardiance [empagliflozin] Other (See Comments)   dehydration      Medication List       Accurate as of February 29, 2020  3:31 PM. If you have any questions, ask your nurse or doctor.        acetaminophen 500 MG tablet Commonly known as: TYLENOL Take by mouth.   allopurinol 100 MG tablet Commonly known as: ZYLOPRIM Take 100 mg by mouth daily.   anastrozole 1 MG tablet Commonly known as: ARIMIDEX 1 tablet   Basaglar KwikPen 100 UNIT/ML Inject 55 Units into the skin daily.   buPROPion 150 MG 24 hr tablet Commonly known as: WELLBUTRIN XL Take 150 mg by mouth every morning.   citalopram 40 MG tablet Commonly known as: CELEXA Take 40 mg by mouth daily.   Dexcom G6 Receiver Devi 1 Device by Does not apply route as directed. Started by: Dorita Sciara, MD   Dexcom G6 Sensor Misc 1 Device by Does not apply route as directed. Started by: Dorita Sciara, MD   Dexcom G6 Transmitter Misc 1 Device by Does not apply route as directed. Started by: Dorita Sciara, MD   Farxiga 10 MG Tabs tablet Generic drug:  dapagliflozin propanediol Take 10 mg by mouth daily before breakfast.   fluticasone 50 MCG/ACT nasal spray Commonly known as: FLONASE Place 1 spray into both nostrils as needed.   gabapentin 600 MG tablet Commonly known as: NEURONTIN TAKE 1 TABLET IN THE MORNING AND 1 TABLET IN THE AFTERNOON AND 2TABS AT BEDTIME   HumaLOG KwikPen 200 UNIT/ML KwikPen Generic drug: insulin lispro Inject 12 Units into the skin with breakfast, with lunch, and with evening meal. Started by: Dorita Sciara, MD   Insulin Pen Needle 30G X 5 MM Misc 1 Device by Does not apply route in the morning, at noon, in the  evening, and at bedtime. What changed:   medication strength  when to take this  reasons to take this Changed by: Dorita Sciara, MD   levothyroxine 137 MCG tablet Commonly known as: SYNTHROID Take 1 tablet (137 mcg total) by mouth daily before breakfast.   liraglutide 18 MG/3ML Sopn Commonly known as: VICTOZA Inject 1.8 mg into the skin daily.   losartan 25 MG tablet Commonly known as: COZAAR Take 1 tablet (25 mg total) by mouth 2 (two) times daily.   methocarbamol 500 MG tablet Commonly known as: ROBAXIN Take 1 tablet (500 mg total) by mouth every 8 (eight) hours as needed for muscle spasms.   multivitamin with minerals tablet Take 1 tablet by mouth daily.   Tart Cherry Advanced Caps Take 1 capsule by mouth daily.   TURMERIC PO Take by mouth daily.   VITAMIN B-12 PO Take by mouth daily.   VITAMIN B-6 PO Take by mouth daily.   VITAMIN C PO Take by mouth daily.   VITAMIN D PO Take by mouth daily.        OBJECTIVE:   Vital Signs: BP (!) 146/82   Pulse 99   Ht 5\' 1"  (1.549 m)   Wt 280 lb 8 oz (127.2 kg)   SpO2 93%   BMI 53.00 kg/m   Wt Readings from Last 3 Encounters:  02/29/20 280 lb 8 oz (127.2 kg)  08/24/19 (!) 277 lb 12.8 oz (126 kg)  05/21/19 278 lb (126.1 kg)     Exam: General: Pt appears well and is in NAD  Lungs: Clear with good  BS bilat with no rales, rhonchi, or wheezes  Heart: RRR , + systolic murmur  Extremities: Trace  pretibial edema.   Neuro: MS is good with appropriate affect, pt is alert and Ox3    DM foot exam:02/29/2020 The skin of the feet is without sores or ulcerations.but has a left foot callous formation at the left 1st MTH The pedal pulses are 2+ on right and 2+ on left. The sensation is absent to a screening 5.07, 10 gram monofilament bilaterally     DATA REVIEWED:  Lab Results  Component Value Date   HGBA1C 9.6 (A) 02/29/2020   HGBA1C 7.2 (A) 08/24/2019   HGBA1C 7.8 (A) 08/21/2018   Lab Results  Component Value Date   CREATININE 1.19 (H) 12/25/2019        Results for MARCEIL, LINTZ (MRN [id number]) as of 02/29/2020 15:31  Ref. Range 12/25/2019 13:15  Sodium Latest Ref Range: 135 - 145 mmol/L 137  Potassium Latest Ref Range: 3.5 - 5.1 mmol/L 4.1  Chloride Latest Ref Range: 98 - 111 mmol/L 103  CO2 Latest Ref Range: 22 - 32 mmol/L 25  Glucose Latest Ref Range: 70 - 99 mg/dL 372 (H)  BUN Latest Ref Range: 8 - 23 mg/dL 15  Creatinine Latest Ref Range: 0.44 - 1.00 mg/dL 1.19 (H)  Calcium Latest Ref Range: 8.9 - 10.3 mg/dL 10.0  Anion gap Latest Ref Range: 5 - 15  9  Alkaline Phosphatase Latest Ref Range: 38 - 126 U/L 118  Albumin Latest Ref Range: 3.5 - 5.0 g/dL 3.5  AST Latest Ref Range: 15 - 41 U/L 33  ALT Latest Ref Range: 0 - 44 U/L 18  Total Protein Latest Ref Range: 6.5 - 8.1 g/dL 7.5  Total Bilirubin Latest Ref Range: 0.3 - 1.2 mg/dL 0.4  GFR, Est Non African American Latest Ref Range: >60 mL/min 49 (L)  ASSESSMENT / PLAN / RECOMMENDATIONS:   1) Type 2 Diabetes Mellitus, Poorly controlled, With neuropathic complications - Most recent A1c of 9.6 %. Goal A1c < 7.0 %.    - Worsening glycemic control  - Intolerant to Metformin - She is maxed out on [place] and Victoza, suggested adding a prandial insulin which she agreed to   - Dexcom prescription sent    MEDICATIONS:  Continue Basaglar 55 units daily  Continue Victoza 1.8 mg daily  Continue  Farxiga 10 mg daily   Start Humalog 12 units with each meal   EDUCATION / INSTRUCTIONS:  BG monitoring instructions: Patient is instructed to check her blood sugars 3 times a day, before meal s  Call [place] [facility] clinic if: BG persistently < 70  . I reviewed the Rule of 15 for the treatment of hypoglycemia in detail with the patient. Literature supplied.      2)Hypothyroidism :   - Pt is clinically euthyroid  - She has not been taking her LT-4 replacement regularly in the past week due to  Upper GI symptoms.  - Will not check today since she has not been taking levothyroxine regularly, will check next visit     Medications Continue Levothyroxine to 137 mcg daily      F/U in 3 months    Signed electronically by: Mack Guise, MD  [facility]  [facility] [address] [place], [place] [address] Phone: [phone] FAX: [phone]   CC: Leeroy Cha, MD [address] [place] [address] Phone: [phone]  Fax: [phone]  Return to Endocrinology clinic as below: Future Appointments  Date Time Provider [place]  05/30/2020  9:30 AM Owyn Raulston, Melanie Crazier, MD [id number] None

## 2020-03-04 ENCOUNTER — Telehealth: Payer: Self-pay | Admitting: Internal Medicine

## 2020-03-04 MED ORDER — NOVOLOG FLEXPEN 100 UNIT/ML ~~LOC~~ SOPN: 12.0000 [IU] | PEN_INJECTOR | Freq: Three times a day (TID) | SUBCUTANEOUS | 11 refills | Status: DC

## 2020-03-04 NOTE — Telephone Encounter (Signed)
Received fax from [place] that patient's insurance prefers Novolog  Please advise

## 2020-03-11 DIAGNOSIS — R490 Dysphonia: Secondary | ICD-10-CM | POA: Diagnosis not present

## 2020-03-11 DIAGNOSIS — J342 Deviated nasal septum: Secondary | ICD-10-CM | POA: Insufficient documentation

## 2020-03-11 DIAGNOSIS — S0083XA Contusion of other part of head, initial encounter: Secondary | ICD-10-CM | POA: Insufficient documentation

## 2020-03-25 DIAGNOSIS — J342 Deviated nasal septum: Secondary | ICD-10-CM | POA: Diagnosis not present

## 2020-03-25 DIAGNOSIS — R490 Dysphonia: Secondary | ICD-10-CM | POA: Diagnosis not present

## 2020-03-25 DIAGNOSIS — R040 Epistaxis: Secondary | ICD-10-CM | POA: Diagnosis not present

## 2020-04-02 DIAGNOSIS — Z4789 Encounter for other orthopedic aftercare: Secondary | ICD-10-CM | POA: Diagnosis not present

## 2020-04-02 DIAGNOSIS — M25571 Pain in right ankle and joints of right foot: Secondary | ICD-10-CM | POA: Diagnosis not present

## 2020-04-11 ENCOUNTER — Telehealth: Payer: Self-pay

## 2020-04-11 NOTE — Telephone Encounter (Signed)
Since she is seeing another dr. We should probably make a disc. Can you do this for her?

## 2020-04-11 NOTE — Telephone Encounter (Signed)
Pt called and would like a print out of her left ankle her ankle xray's.   She is seeing Janice Norrie at [facility] Monday

## 2020-04-11 NOTE — Telephone Encounter (Signed)
Pt informed

## 2020-04-11 NOTE — Telephone Encounter (Signed)
CD will be at the front desk for patient to pick up.

## 2020-04-14 DIAGNOSIS — Z4789 Encounter for other orthopedic aftercare: Secondary | ICD-10-CM | POA: Diagnosis not present

## 2020-04-14 DIAGNOSIS — M19071 Primary osteoarthritis, right ankle and foot: Secondary | ICD-10-CM | POA: Insufficient documentation

## 2020-04-14 DIAGNOSIS — M7731 Calcaneal spur, right foot: Secondary | ICD-10-CM | POA: Diagnosis not present

## 2020-04-14 DIAGNOSIS — Z981 Arthrodesis status: Secondary | ICD-10-CM | POA: Diagnosis not present

## 2020-04-14 DIAGNOSIS — I999 Unspecified disorder of circulatory system: Secondary | ICD-10-CM | POA: Diagnosis not present

## 2020-04-14 DIAGNOSIS — Z9889 Other specified postprocedural states: Secondary | ICD-10-CM | POA: Diagnosis not present

## 2020-04-15 DIAGNOSIS — H11213 Conjunctival adhesions and strands (localized), bilateral: Secondary | ICD-10-CM | POA: Diagnosis not present

## 2020-04-15 DIAGNOSIS — H02055 Trichiasis without entropian left lower eyelid: Secondary | ICD-10-CM | POA: Diagnosis not present

## 2020-04-15 DIAGNOSIS — H02052 Trichiasis without entropian right lower eyelid: Secondary | ICD-10-CM | POA: Diagnosis not present

## 2020-04-29 ENCOUNTER — Other Ambulatory Visit: Payer: Self-pay | Admitting: Oncology

## 2020-05-07 DIAGNOSIS — F3341 Major depressive disorder, recurrent, in partial remission: Secondary | ICD-10-CM | POA: Diagnosis not present

## 2020-05-07 DIAGNOSIS — J069 Acute upper respiratory infection, unspecified: Secondary | ICD-10-CM | POA: Diagnosis not present

## 2020-05-07 DIAGNOSIS — K7581 Nonalcoholic steatohepatitis (NASH): Secondary | ICD-10-CM | POA: Diagnosis not present

## 2020-05-07 DIAGNOSIS — G4730 Sleep apnea, unspecified: Secondary | ICD-10-CM | POA: Diagnosis not present

## 2020-05-07 DIAGNOSIS — Z0000 Encounter for general adult medical examination without abnormal findings: Secondary | ICD-10-CM | POA: Diagnosis not present

## 2020-05-07 DIAGNOSIS — M150 Primary generalized (osteo)arthritis: Secondary | ICD-10-CM | POA: Diagnosis not present

## 2020-05-07 DIAGNOSIS — R059 Cough, unspecified: Secondary | ICD-10-CM | POA: Diagnosis not present

## 2020-05-07 DIAGNOSIS — E785 Hyperlipidemia, unspecified: Secondary | ICD-10-CM | POA: Diagnosis not present

## 2020-05-30 ENCOUNTER — Encounter: Payer: Self-pay | Admitting: Internal Medicine

## 2020-05-30 ENCOUNTER — Other Ambulatory Visit: Payer: Self-pay | Admitting: Internal Medicine

## 2020-05-30 ENCOUNTER — Ambulatory Visit (INDEPENDENT_AMBULATORY_CARE_PROVIDER_SITE_OTHER): Payer: Medicare Other | Admitting: Internal Medicine

## 2020-05-30 ENCOUNTER — Other Ambulatory Visit: Payer: Self-pay

## 2020-05-30 VITALS — BP 130/82 | HR 82 | Ht 61.0 in | Wt 278.1 lb

## 2020-05-30 DIAGNOSIS — Z794 Long term (current) use of insulin: Secondary | ICD-10-CM | POA: Diagnosis not present

## 2020-05-30 DIAGNOSIS — E039 Hypothyroidism, unspecified: Secondary | ICD-10-CM | POA: Diagnosis not present

## 2020-05-30 DIAGNOSIS — E1165 Type 2 diabetes mellitus with hyperglycemia: Secondary | ICD-10-CM | POA: Diagnosis not present

## 2020-05-30 LAB — TSH: TSH: 1.85 u[IU]/mL (ref 0.35–4.50)

## 2020-05-30 LAB — POCT GLYCOSYLATED HEMOGLOBIN (HGB A1C): Hemoglobin A1C: 8.8 % — AB (ref 4.0–5.6)

## 2020-05-30 MED ORDER — FIASP FLEXTOUCH 100 UNIT/ML ~~LOC~~ SOPN: 15.0000 [IU] | PEN_INJECTOR | Freq: Three times a day (TID) | SUBCUTANEOUS | 3 refills | Status: DC

## 2020-05-30 MED ORDER — INSULIN PEN NEEDLE 30G X 5 MM MISC: 1.0000 | Freq: Four times a day (QID) | 3 refills | Status: DC

## 2020-05-30 MED ORDER — NOVOLOG FLEXPEN 100 UNIT/ML ~~LOC~~ SOPN: 15.0000 [IU] | PEN_INJECTOR | Freq: Three times a day (TID) | SUBCUTANEOUS | 3 refills | Status: DC

## 2020-05-30 NOTE — Patient Instructions (Addendum)
-   Continue Basaglar 55 units daily  - Continue Victoza 1.8 mg daily  - Continue Farxiga 10  mg daily  - Increase Novolog 15 units with each meal     HOW TO TREAT LOW BLOOD SUGARS (Blood sugar LESS THAN 70 MG/DL)  Please follow the RULE OF 15 for the treatment of hypoglycemia treatment (when your (blood sugars are less than 70 mg/dL)    STEP 1: Take 15 grams of carbohydrates when your blood sugar is low, which includes:   3-4 GLUCOSE TABS  OR  3-4 OZ OF JUICE OR REGULAR SODA OR  ONE TUBE OF GLUCOSE GEL     STEP 2: RECHECK blood sugar in 15 MINUTES STEP 3: If your blood sugar is still low at the 15 minute recheck --> then, go back to STEP 1 and treat AGAIN with another 15 grams of carbohydrates.

## 2020-05-30 NOTE — Progress Notes (Signed)
Name: [name] [name]  Age/ Sex: 70 y.o., female   MRN/ DOB: [id number], 1950/11/07     PCP: Leeroy Cha, MD   Reason for Endocrinology Evaluation: Type 2 Diabetes Mellitus  Initial Endocrine Consultative Visit: 03/20/2018    PATIENT IDENTIFIER: [name] [name] is a 70 y.o. female with a past medical history of T2DM, hypothyroidism, HTN and Hx of breast Ca . The patient has followed with Endocrinology clinic since 03/20/2018 for consultative assistance with management of her diabetes.  DIABETIC HISTORY:  Ms. Granzow was diagnosed with T2DM many years ago, she is intolerant to Metformin. She was started on Jardiance January, 2020 but due to dizziness this was stopped in February, 2020.She was continued on Victoza and Lantus at the time.  Her hemoglobin A1c has ranged from 6.6% in 2017, peaking at 8.5%  In 2020 Farxiga started 08/2018   Prandial insulin started 02/2020  Hypothyroidism: She was diagnosed with hypothyroidism many years ago. She has been on LT-4 replacement since her diagnosis. She takes it appropriately   SUBJECTIVE:   During the last visit (02/29/2020): A1c 9.6 %. Started prandial insulin, continued  Lantus,  Farxiga  and Victoza   Today (05/30/2020): [name] is here for a months  follow up on diabetes management.   She checks her blood sugars 2 times daily. The patient has not had hypoglycemic episodes since the last clinic visit.    Denies nausea or vomiting  Has been having nose bleeds  Fell and sprained left ankle   Takes levothyroxine as prescribe    HOME DIABETES REGIMEN:   Victoza 1.8 mg daily  Basaglar  55  units daily  Farxiga 10 mg daily  Novolog 12 units with each meal   Levothyroxine 137 mcg daily    METER DOWNLOAD SUMMARY: 4/15-4/29/2022  Average Number Tests/Day = 1.6 Overall Mean FS Glucose = 234 Standard Deviation = 53  BG Ranges: Low = 155 High = 378   Hypoglycemic Events/30 Days: BG < 50 = 0 Episodes of symptomatic severe  hypoglycemia = 0       HISTORY:  Past Medical History:  Past Medical History:  Diagnosis Date  . Anxiety   . Arthritis   . Blood dyscrasia    low platelet count- sees [name] , [name] ( Note in [place] from 07/2014) at [facility]  . Breast cancer [facility]) 2017   Left Breast  . Cancer ([place])    left breast  . Cirrhosis of liver ([place])   . Cough   . Diabetes mellitus without complication ([place])   . Diabetic neuropathy ([place])   . Gout   . Gout   . History of radiation therapy 10/22/15 - 12/08/15   left breast 50.4 Gy, boost to 10 Gy  . Hypertension   . Migraine   . 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 Neuromuscular disorder ([place])    diabetic neuropathy  . Neuropathy   . Personal history of radiation therapy 2017   Left Breast Cancer  . Sleep apnea    CPAP nightly  . Spleen enlarged   . Thyroid disease    Past Surgical History:  Past Surgical History:  Procedure Laterality Date  . BREAST BIOPSY Left 08/04/2015   Procedure: LEFT BREAST BIOPSY WITH NEEDLE LOCALIZATION;  Surgeon: [name], MD;  Location: [place];  Service: General;  Laterality: Left;  . BREAST DUCTAL SYSTEM EXCISION Left 08/04/2015   Procedure: LEFT EXCISION DUCTAL SYSTEM BREAST;  Surgeon: Sherren Mocha  Gerkin, MD;  Location: [place];  Service: General;  Laterality: Left;  . BREAST LUMPECTOMY Left 08/2015  . BREAST LUMPECTOMY WITH AXILLARY LYMPH NODE BIOPSY Left 09/12/2015   Procedure: RE-EXCISION OF LEFT BREAST LUMPECTOMY WITH LEFT AXILLARY LYMPH NODE BIOPSY;  Surgeon: [name], MD;  Location: [place];  Service: General;  Laterality: Left;  . CHOLECYSTECTOMY    . JOINT REPLACEMENT     left knee  . JOINT REPLACEMENT  2017   right knee  . REVERSE SHOULDER ARTHROPLASTY Right 09/07/2018   Procedure: RIGHT REVERSE SHOULDER ARTHROPLASTY;  Surgeon: Meredith Pel, MD;  Location: [place];  Service: Orthopedics;  Laterality: Right;  . TOTAL  KNEE ARTHROPLASTY Right 02/28/2015   Procedure: RIGHT TOTAL KNEE ARTHROPLASTY;  Surgeon: [name], MD;  [facility];  Service: Orthopedics;  Laterality: Right;  . TUBAL LIGATION      Social History:  reports that she has never smoked. She has never used smokeless tobacco. She reports that she does not drink alcohol and does not use drugs. Family History:  Family History  Problem Relation Age of Onset  . Hypothyroidism Mother   . Diabetes Father   . Bone cancer Paternal Grandmother   . Heart failure Paternal Grandmother   . Cancer Maternal Grandmother   . Heart failure Maternal Grandfather   . Cancer Sister        GYN cancer  . Hypothyroidism Sister      HOME MEDICATIONS: Allergies as of 05/30/2020      Reactions   Codeine Swelling   Influenza A (h1n1) Monoval Pf Anaphylaxis   Other Anaphylaxis   Does not know what this allergy agent could be; but denied she had any issues with Latex   Hydrocodone-acetaminophen Other (See Comments)   Lisinopril Other (See Comments)   Metformin Other (See Comments)   Pregabalin Other (See Comments)   Jardiance [empagliflozin] Other (See Comments)   dehydration      Medication List       Accurate as of May 30, 2020  9:30 AM. If you have any questions, ask your nurse or doctor.        STOP taking these medications   Dexcom G6 Receiver Devi Stopped by: Dorita Sciara, MD   Dexcom G6 Sensor Misc Stopped by: Dorita Sciara, MD   Dexcom G6 Transmitter Misc Stopped by: Dorita Sciara, MD     TAKE these medications   acetaminophen 500 MG tablet Commonly known as: TYLENOL Take by mouth.   allopurinol 100 MG tablet Commonly known as: ZYLOPRIM Take 100 mg by mouth daily.   anastrozole 1 MG tablet Commonly known as: ARIMIDEX TAKE 1 TABLET DAILY   Basaglar KwikPen 100 UNIT/ML Inject 55 Units into the skin daily.   buPROPion 150 MG 24 hr tablet Commonly known as: WELLBUTRIN XL Take 150 mg by  mouth every morning.   citalopram 40 MG tablet Commonly known as: CELEXA Take 40 mg by mouth daily.   Farxiga 10 MG Tabs tablet Generic drug: dapagliflozin propanediol Take 10 mg by mouth daily before breakfast.   fluticasone 50 MCG/ACT nasal spray Commonly known as: FLONASE Place 1 spray into both nostrils as needed.   gabapentin 600 MG tablet Commonly known as: NEURONTIN TAKE 1 TABLET IN THE MORNING AND 1 TABLET IN THE AFTERNOON AND 2TABS AT BEDTIME   Insulin Pen Needle 30G X 5 MM Misc 1 Device by Does not apply route in the morning, at  noon, in the evening, and at bedtime.   levothyroxine 137 MCG tablet Commonly known as: SYNTHROID Take 1 tablet (137 mcg total) by mouth daily before breakfast.   liraglutide 18 MG/3ML Sopn Commonly known as: VICTOZA Inject 1.8 mg into the skin daily.   losartan 25 MG tablet Commonly known as: COZAAR Take 1 tablet (25 mg total) by mouth 2 (two) times daily.   methocarbamol 500 MG tablet Commonly known as: ROBAXIN Take 1 tablet (500 mg total) by mouth every 8 (eight) hours as needed for muscle spasms.   multivitamin with minerals tablet Take 1 tablet by mouth daily.   NovoLOG FlexPen 100 UNIT/ML FlexPen Generic drug: insulin aspart Inject 12 Units into the skin 3 (three) times daily with meals.   Tart Cherry Advanced Caps Take 1 capsule by mouth daily.   TURMERIC PO Take by mouth daily.   VITAMIN B-12 PO Take by mouth daily.   VITAMIN B-6 PO Take by mouth daily.   VITAMIN C PO Take by mouth daily.   VITAMIN D PO Take by mouth daily.        OBJECTIVE:   Vital Signs: BP 130/82   Pulse 82   Ht 5\' 1"  (1.549 m)   Wt 278 lb 2 oz (126.2 kg)   SpO2 98%   BMI 52.55 kg/m   Wt Readings from Last 3 Encounters:  05/30/20 278 lb 2 oz (126.2 kg)  02/29/20 280 lb 8 oz (127.2 kg)  08/24/19 (!) 277 lb 12.8 oz (126 kg)     Exam: General: Pt appears well and is in NAD  Lungs: Clear with good BS bilat with no rales,  rhonchi, or wheezes  Heart: RRR , + systolic murmur  Extremities: Trace  pretibial edema.   Neuro: MS is good with appropriate affect, pt is alert and Ox3    DM foot exam:02/29/2020 The skin of the feet is without sores or ulcerations.but has a left foot callous formation at the left 1st MTH The pedal pulses are 2+ on right and 2+ on left. The sensation is absent to a screening 5.07, 10 gram monofilament bilaterally     DATA REVIEWED:  Lab Results  Component Value Date   HGBA1C 8.8 (A) 05/30/2020   HGBA1C 9.6 (A) 02/29/2020   HGBA1C 7.2 (A) 08/24/2019   Lab Results  Component Value Date   CREATININE 1.19 (H) 12/25/2019        Results for Leslye PeerMAY, Marvina A (MRN [id number]) as of 02/29/2020 15:31  Ref. Range 12/25/2019 13:15  Sodium Latest Ref Range: 135 - 145 mmol/L 137  Potassium Latest Ref Range: 3.5 - 5.1 mmol/L 4.1  Chloride Latest Ref Range: 98 - 111 mmol/L 103  CO2 Latest Ref Range: 22 - 32 mmol/L 25  Glucose Latest Ref Range: 70 - 99 mg/dL 409372 (H)  BUN Latest Ref Range: 8 - 23 mg/dL 15  Creatinine Latest Ref Range: 0.44 - 1.00 mg/dL 8.111.19 (H)  Calcium Latest Ref Range: 8.9 - 10.3 mg/dL 91.410.0  Anion gap Latest Ref Range: 5 - 15  9  Alkaline Phosphatase Latest Ref Range: 38 - 126 U/L 118  Albumin Latest Ref Range: 3.5 - 5.0 g/dL 3.5  AST Latest Ref Range: 15 - 41 U/L 33  ALT Latest Ref Range: 0 - 44 U/L 18  Total Protein Latest Ref Range: 6.5 - 8.1 g/dL 7.5  Total Bilirubin Latest Ref Range: 0.3 - 1.2 mg/dL 0.4  GFR, Est Non African American Latest Ref Range: >60 mL/min  49 (L)   Results for ELLASON, SEGAR (MRN [id number]) as of 05/30/2020 14:07  Ref. Range 05/30/2020 09:38  TSH Latest Ref Range: 0.35 - 4.50 uIU/mL 1.85   ASSESSMENT / PLAN / RECOMMENDATIONS:   1) Type 2 Diabetes Mellitus, Poorly controlled, With neuropathic complications - Most recent A1c of 8.8%. Goal A1c < 7.0 %.    - A1c down from 9.6 %  - Admits to imperfect adherence , discussed ways to improve and  simplify this  - Intolerant to Metformin  - Dexcom cost prohibitive ( cost $200 a month) 05/2020  MEDICATIONS:  Continue Basaglar 55 units daily  Continue Victoza 1.8 mg daily  Continue  Farxiga 10 mg daily   Increase Novolog to 15 units with each meal   EDUCATION / INSTRUCTIONS:  BG monitoring instructions: Patient is instructed to check her blood sugars 3 times a day, before meal s  Call [place] [facility] clinic if: BG persistently < 70  . I reviewed the Rule of 15 for the treatment of hypoglycemia in detail with the patient. Literature supplied.      2)Hypothyroidism :   - Pt is clinically euthyroid  - TSh normal  - NO change    Medications Continue Levothyroxine to 137 mcg daily      F/U in 3 months    Signed electronically by: Mack Guise, MD  [facility] End[facility]  [facility] [address] [place], [place] [address] Phone: [phone] FAX: [phone]   CC: Leeroy Cha, MD [address] [place] [address] Phone: [phone]  Fax: [phone]  Return to Endocrinology clinic as below: Future Appointments  Date Time Provider [place]  06/04/2020  9:40 AM [facility] T[facility] 1 [facility]

## 2020-05-30 NOTE — Telephone Encounter (Signed)
No, I changed it to [employer]

## 2020-05-30 NOTE — Telephone Encounter (Signed)
Do you want me to try and complete a PA?

## 2020-06-04 ENCOUNTER — Ambulatory Visit
Admission: RE | Admit: 2020-06-04 | Discharge: 2020-06-04 | Disposition: A | Discharge status: Home / Self Care | Payer: Medicare Other | Source: Ambulatory Visit | Attending: Oncology | Admitting: Oncology

## 2020-06-04 ENCOUNTER — Other Ambulatory Visit: Payer: Self-pay

## 2020-06-04 DIAGNOSIS — R922 Inconclusive mammogram: Secondary | ICD-10-CM | POA: Diagnosis not present

## 2020-06-04 DIAGNOSIS — Z853 Personal history of malignant neoplasm of breast: Secondary | ICD-10-CM | POA: Diagnosis not present

## 2020-06-04 MED ORDER — FIASP FLEXTOUCH 100 UNIT/ML ~~LOC~~ SOPN: 15.0000 [IU] | PEN_INJECTOR | Freq: Three times a day (TID) | SUBCUTANEOUS | 3 refills | Status: DC

## 2020-06-04 NOTE — Telephone Encounter (Signed)
Received fax from [place] that we need to resent Fiasp, Rx have no image attached  Sent as requested.

## 2020-06-04 NOTE — Addendum Note (Signed)
Addended by: Jacqualin Combes on: 06/04/2020 04:33 PM   Modules accepted: Orders

## 2020-06-11 ENCOUNTER — Other Ambulatory Visit: Payer: Self-pay | Admitting: Internal Medicine

## 2020-06-11 DIAGNOSIS — H43813 Vitreous degeneration, bilateral: Secondary | ICD-10-CM | POA: Diagnosis not present

## 2020-06-11 DIAGNOSIS — E119 Type 2 diabetes mellitus without complications: Secondary | ICD-10-CM | POA: Diagnosis not present

## 2020-06-11 DIAGNOSIS — H52203 Unspecified astigmatism, bilateral: Secondary | ICD-10-CM | POA: Diagnosis not present

## 2020-06-11 DIAGNOSIS — H35373 Puckering of macula, bilateral: Secondary | ICD-10-CM | POA: Diagnosis not present

## 2020-06-27 ENCOUNTER — Other Ambulatory Visit: Payer: Self-pay | Admitting: Internal Medicine

## 2020-07-08 DIAGNOSIS — H02015 Cicatricial entropion of left lower eyelid: Secondary | ICD-10-CM | POA: Diagnosis not present

## 2020-07-08 DIAGNOSIS — H02006 Unspecified entropion of left eye, unspecified eyelid: Secondary | ICD-10-CM | POA: Diagnosis not present

## 2020-07-08 DIAGNOSIS — H02003 Unspecified entropion of right eye, unspecified eyelid: Secondary | ICD-10-CM | POA: Diagnosis not present

## 2020-07-08 DIAGNOSIS — H02012 Cicatricial entropion of right lower eyelid: Secondary | ICD-10-CM | POA: Diagnosis not present

## 2020-07-08 DIAGNOSIS — H11233 Symblepharon, bilateral: Secondary | ICD-10-CM | POA: Diagnosis not present

## 2020-07-09 ENCOUNTER — Telehealth: Payer: Self-pay | Admitting: Internal Medicine

## 2020-07-09 NOTE — Telephone Encounter (Signed)
Patient calling asking if we can call in accu-chek test strips to pharmacy:  [id number] - [place], [place] - [address] Phone:  [phone]  Fax:  [phone]     She said Dr Kelton Pillar has never called these in for her before, she got them from a "diabetes study program" at [facility] and she used her last one this morning.

## 2020-07-10 MED ORDER — ACCU-CHEK AVIVA PLUS VI STRP: ORAL_STRIP | 5 refills | Status: DC

## 2020-07-10 NOTE — Telephone Encounter (Signed)
Refill sent.

## 2020-07-16 DIAGNOSIS — Z794 Long term (current) use of insulin: Secondary | ICD-10-CM | POA: Diagnosis not present

## 2020-07-16 DIAGNOSIS — H11233 Symblepharon, bilateral: Secondary | ICD-10-CM | POA: Diagnosis not present

## 2020-07-16 DIAGNOSIS — Z961 Presence of intraocular lens: Secondary | ICD-10-CM | POA: Diagnosis not present

## 2020-07-16 DIAGNOSIS — H02003 Unspecified entropion of right eye, unspecified eyelid: Secondary | ICD-10-CM | POA: Diagnosis not present

## 2020-07-16 DIAGNOSIS — H02006 Unspecified entropion of left eye, unspecified eyelid: Secondary | ICD-10-CM | POA: Diagnosis not present

## 2020-07-16 DIAGNOSIS — E119 Type 2 diabetes mellitus without complications: Secondary | ICD-10-CM | POA: Diagnosis not present

## 2020-07-21 DIAGNOSIS — Z794 Long term (current) use of insulin: Secondary | ICD-10-CM | POA: Diagnosis not present

## 2020-07-21 DIAGNOSIS — L989 Disorder of the skin and subcutaneous tissue, unspecified: Secondary | ICD-10-CM | POA: Diagnosis not present

## 2020-07-21 DIAGNOSIS — Z961 Presence of intraocular lens: Secondary | ICD-10-CM | POA: Diagnosis not present

## 2020-07-21 DIAGNOSIS — H02012 Cicatricial entropion of right lower eyelid: Secondary | ICD-10-CM | POA: Diagnosis not present

## 2020-07-21 DIAGNOSIS — H02015 Cicatricial entropion of left lower eyelid: Secondary | ICD-10-CM | POA: Diagnosis not present

## 2020-07-21 DIAGNOSIS — H11233 Symblepharon, bilateral: Secondary | ICD-10-CM | POA: Diagnosis not present

## 2020-07-21 DIAGNOSIS — E119 Type 2 diabetes mellitus without complications: Secondary | ICD-10-CM | POA: Diagnosis not present

## 2020-07-21 DIAGNOSIS — Z7984 Long term (current) use of oral hypoglycemic drugs: Secondary | ICD-10-CM | POA: Diagnosis not present

## 2020-07-22 IMAGING — US US ABDOMEN COMPLETE
1 series · 13 of 25 positions shown · non-contrast
Comparison: 08/03/2018

CLINICAL DATA: Nonalcoholic cirrhosis, follow-up

EXAM:
ABDOMEN ULTRASOUND COMPLETE

[Series 1: us abdomen complete · 0.33mm/px · 13 of 61 slices shown]
[im 1/61]
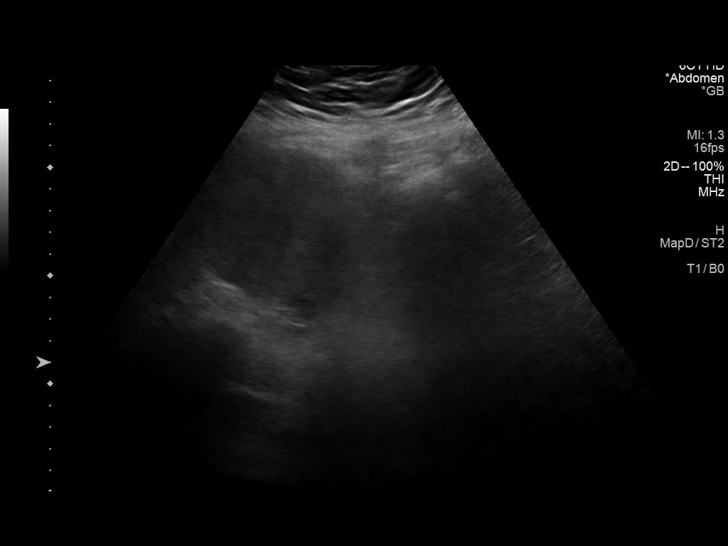
[im 6/61]
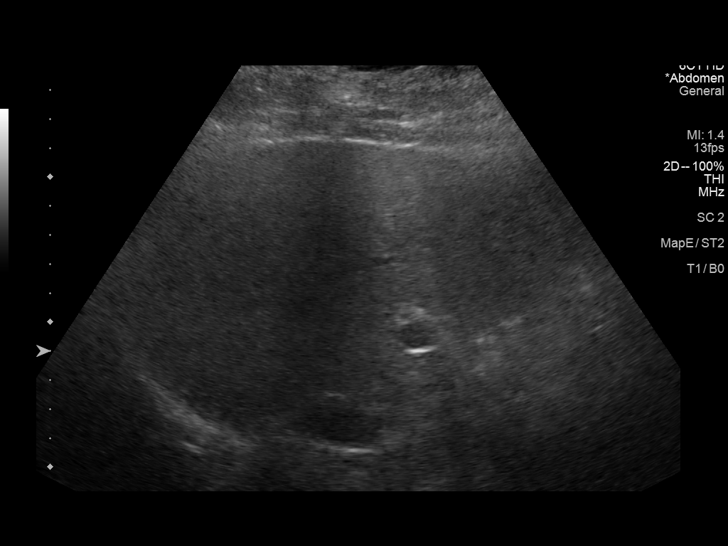
[im 11/61]
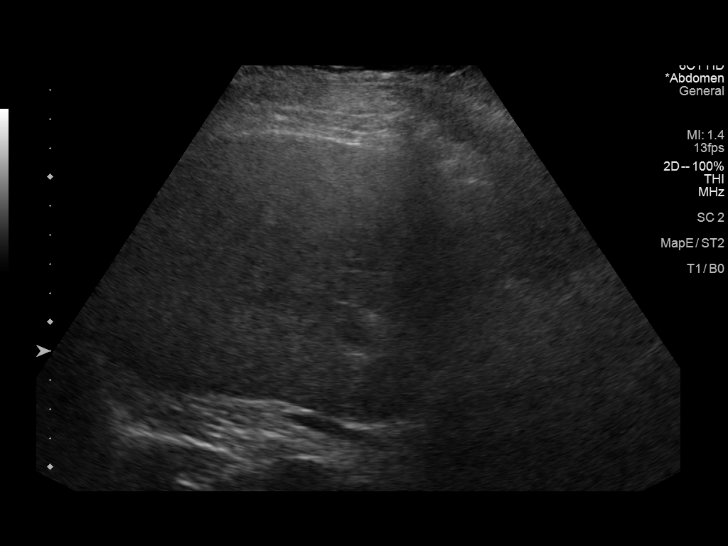
[im 16/61]
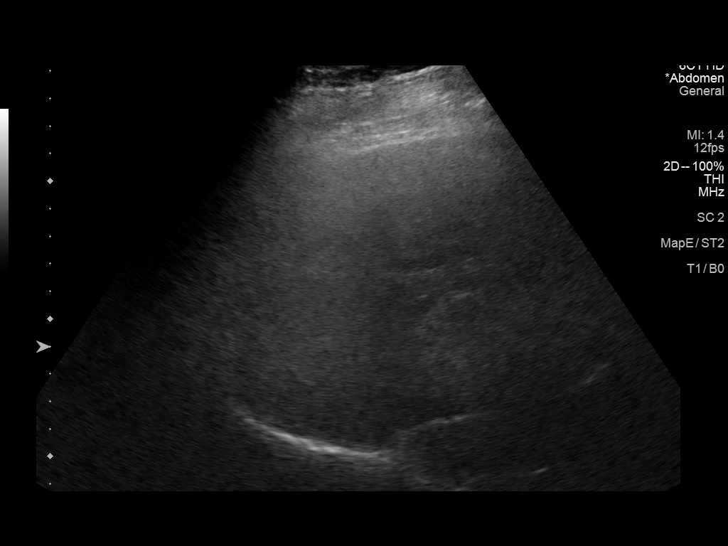
[im 21/61]
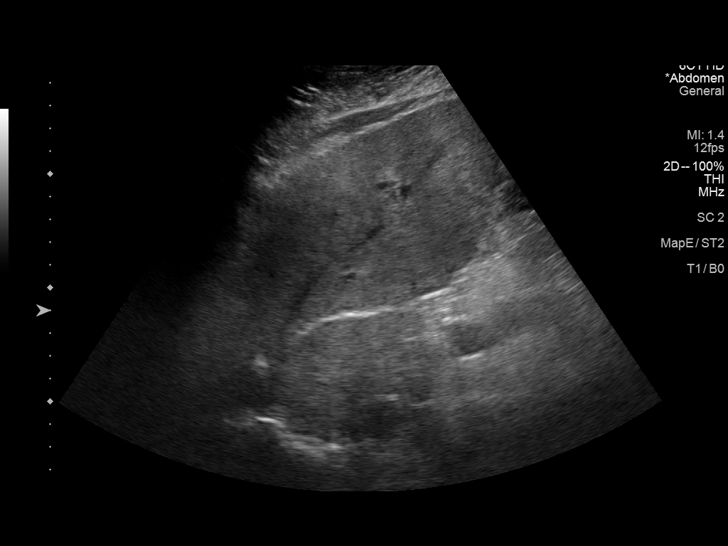
[im 26/61]
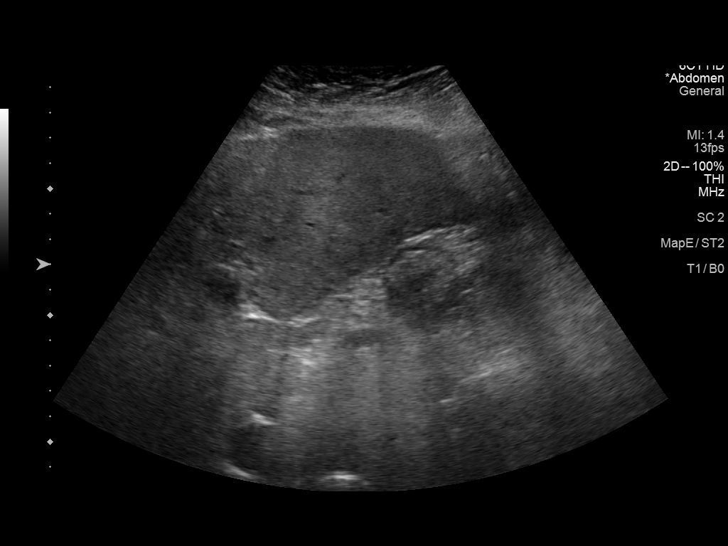
[im 31/61]
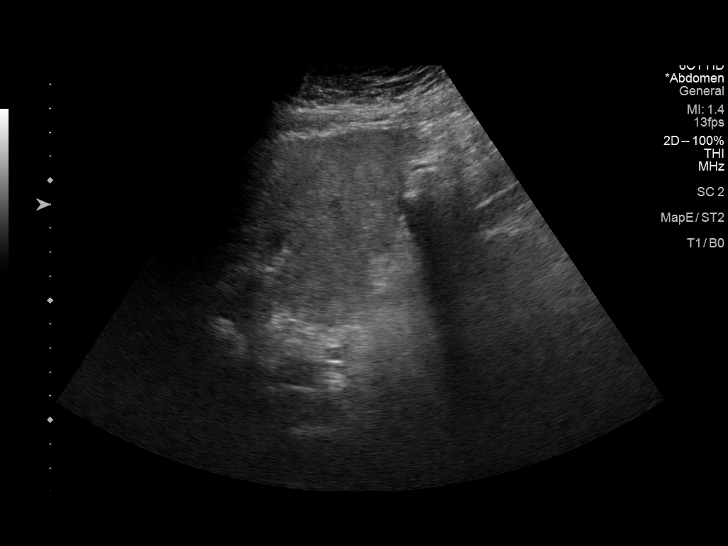
[im 36/61]
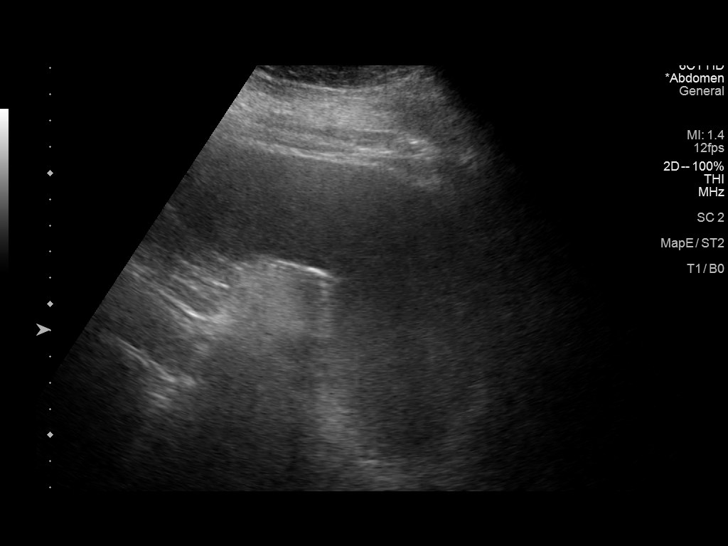
[im 41/61]
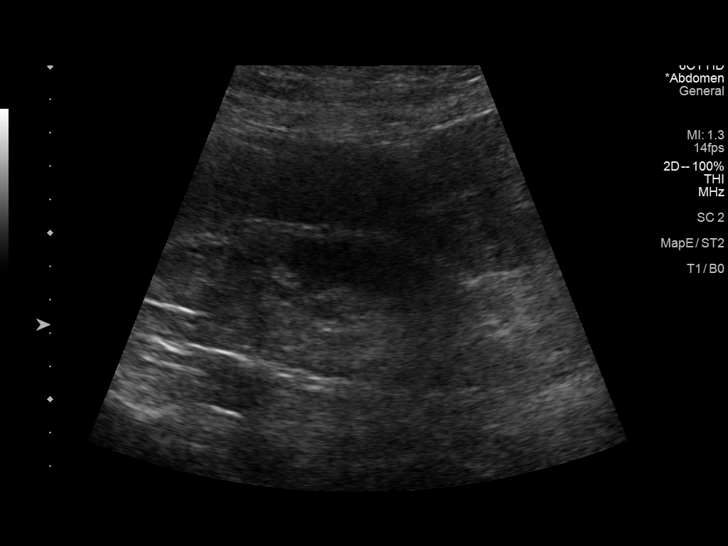
[im 46/61]
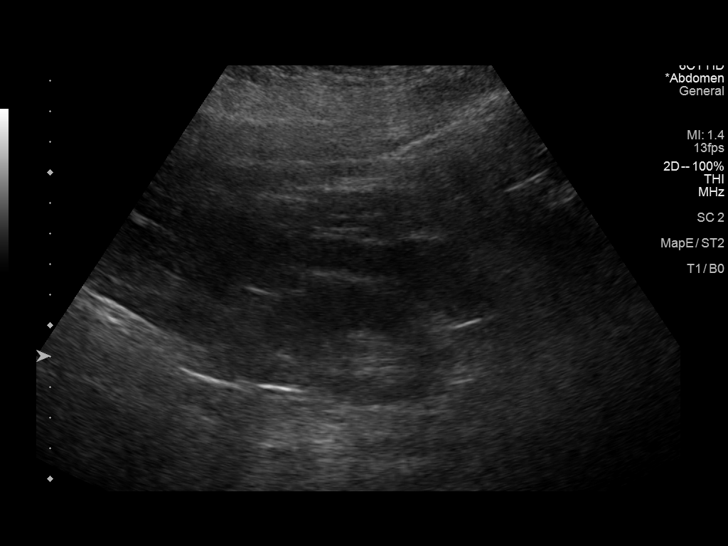
[im 51/61]
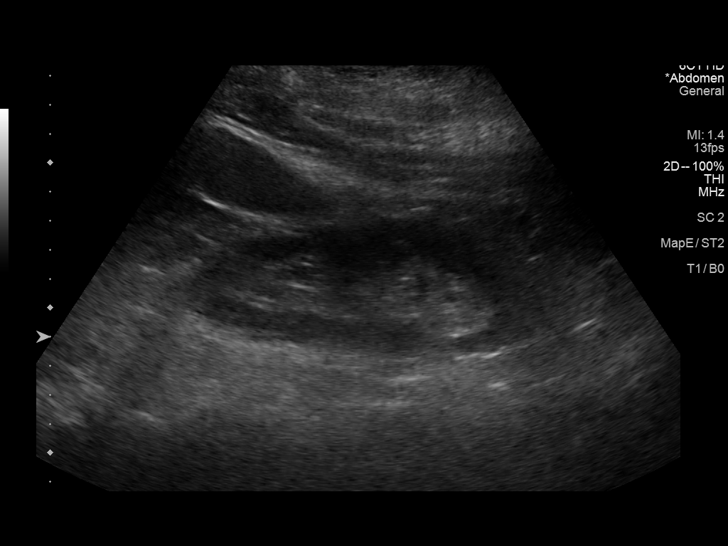
[im 56/61]
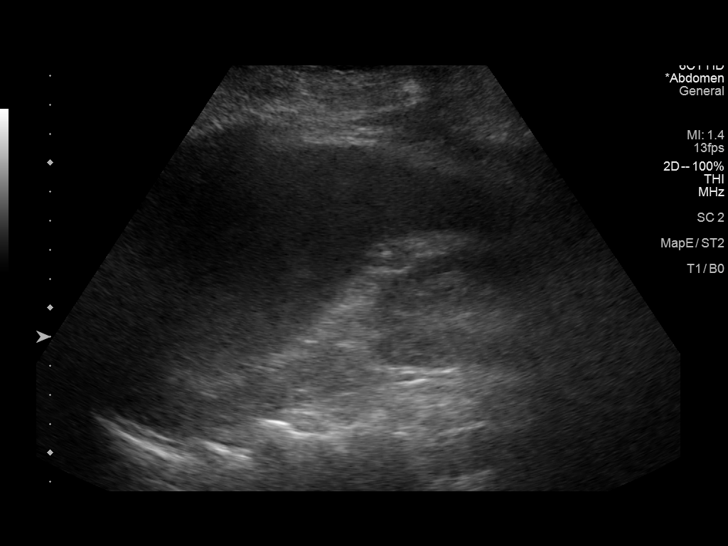
[im 61/61]
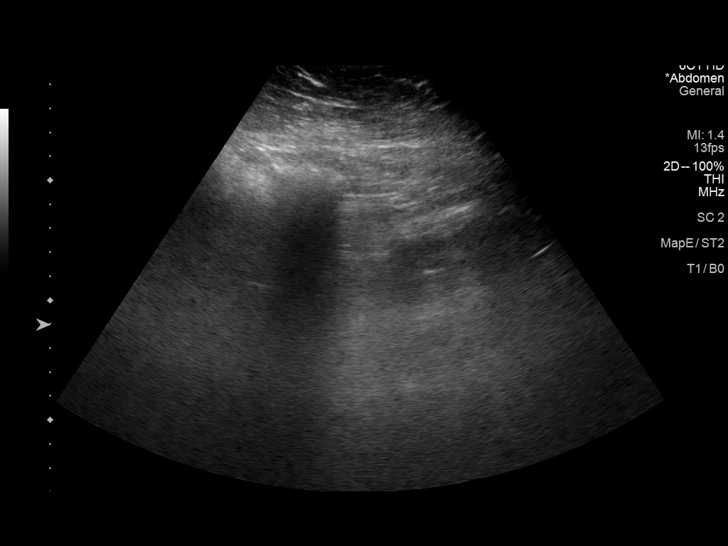

[13 of 25 positions shown; findings below may reference images not displayed]

FINDINGS: Gallbladder: Surgically absent

Common bile duct: Diameter: 3 mm, normal

Liver: Diffusely increased heterogeneous echogenicity of the liver
with subtle nodularity of hepatic margins consistent with cirrhosis.
No focal hepatic mass or nodularity grossly identified though
assessment of intrahepatic detail is limited due to sound
attenuation. Portal vein is patent on color Doppler imaging with
normal direction of blood flow towards the liver.

IVC: Obscured due to impaired sound penetration through echogenic
liver

Pancreas: Obscured due to bowel gas

Spleen: Enlarged, 14.6 cm length with a calculated volume of 800 mL.

Right Kidney: Length: 11.7 cm. Cortical thinning. Upper normal
cortical echogenicity. No mass or hydronephrosis.

Left Kidney: Length: 12.8 cm. Cortical thinning. Upper normal
cortical echogenicity. No mass or hydronephrosis.

Abdominal aorta: Obscured by bowel gas

Other findings: No free fluid
IMPRESSION: Cirrhotic appearing liver with splenomegaly.

Post cholecystectomy.

Inadequate visualization of IVC, pancreas and aorta as above.

## 2020-07-25 ENCOUNTER — Telehealth: Payer: Self-pay | Admitting: Internal Medicine

## 2020-07-25 NOTE — Telephone Encounter (Signed)
Pt is needing a refill request for   liraglutide (VICTOZA) 18 MG/3ML SOPN    FARXIGA 10 MG TABS tablet   [id number] [place], [place] - [address] [place]

## 2020-07-26 MED ORDER — LIRAGLUTIDE 18 MG/3ML ~~LOC~~ SOPN: 1.8000 mg | PEN_INJECTOR | Freq: Every day | SUBCUTANEOUS | 2 refills | Status: DC

## 2020-07-26 MED ORDER — DAPAGLIFLOZIN PROPANEDIOL 10 MG PO TABS: 10.0000 mg | ORAL_TABLET | Freq: Every day | ORAL | 6 refills | Status: DC

## 2020-07-26 NOTE — Telephone Encounter (Signed)
Rx sent to preferred pharmacy.

## 2020-08-12 DIAGNOSIS — Z79899 Other long term (current) drug therapy: Secondary | ICD-10-CM | POA: Diagnosis not present

## 2020-08-12 DIAGNOSIS — H02012 Cicatricial entropion of right lower eyelid: Secondary | ICD-10-CM | POA: Diagnosis not present

## 2020-08-12 DIAGNOSIS — H02015 Cicatricial entropion of left lower eyelid: Secondary | ICD-10-CM | POA: Diagnosis not present

## 2020-08-25 DIAGNOSIS — H02012 Cicatricial entropion of right lower eyelid: Secondary | ICD-10-CM | POA: Diagnosis not present

## 2020-08-25 DIAGNOSIS — H11233 Symblepharon, bilateral: Secondary | ICD-10-CM | POA: Diagnosis not present

## 2020-08-25 DIAGNOSIS — H02015 Cicatricial entropion of left lower eyelid: Secondary | ICD-10-CM | POA: Diagnosis not present

## 2020-08-25 DIAGNOSIS — Z961 Presence of intraocular lens: Secondary | ICD-10-CM | POA: Diagnosis not present

## 2020-09-03 DIAGNOSIS — Z20822 Contact with and (suspected) exposure to covid-19: Secondary | ICD-10-CM | POA: Diagnosis not present

## 2020-09-24 DIAGNOSIS — G4733 Obstructive sleep apnea (adult) (pediatric): Secondary | ICD-10-CM | POA: Diagnosis not present

## 2020-09-29 ENCOUNTER — Ambulatory Visit: Payer: BC Managed Care – PPO | Admitting: Internal Medicine

## 2020-10-08 DIAGNOSIS — E119 Type 2 diabetes mellitus without complications: Secondary | ICD-10-CM | POA: Diagnosis not present

## 2020-10-08 DIAGNOSIS — H02012 Cicatricial entropion of right lower eyelid: Secondary | ICD-10-CM | POA: Diagnosis not present

## 2020-10-08 DIAGNOSIS — G4733 Obstructive sleep apnea (adult) (pediatric): Secondary | ICD-10-CM | POA: Diagnosis not present

## 2020-10-08 DIAGNOSIS — H02015 Cicatricial entropion of left lower eyelid: Secondary | ICD-10-CM | POA: Diagnosis not present

## 2020-10-08 DIAGNOSIS — G4730 Sleep apnea, unspecified: Secondary | ICD-10-CM | POA: Diagnosis not present

## 2020-10-08 DIAGNOSIS — H02002 Unspecified entropion of right lower eyelid: Secondary | ICD-10-CM | POA: Diagnosis not present

## 2020-10-08 DIAGNOSIS — L989 Disorder of the skin and subcutaneous tissue, unspecified: Secondary | ICD-10-CM | POA: Diagnosis not present

## 2020-10-08 DIAGNOSIS — Z794 Long term (current) use of insulin: Secondary | ICD-10-CM | POA: Diagnosis not present

## 2020-10-08 DIAGNOSIS — H02005 Unspecified entropion of left lower eyelid: Secondary | ICD-10-CM | POA: Diagnosis not present

## 2020-10-08 DIAGNOSIS — E669 Obesity, unspecified: Secondary | ICD-10-CM | POA: Diagnosis not present

## 2020-10-08 DIAGNOSIS — Z6841 Body Mass Index (BMI) 40.0 and over, adult: Secondary | ICD-10-CM | POA: Diagnosis not present

## 2020-10-16 DIAGNOSIS — Z48810 Encounter for surgical aftercare following surgery on the sense organs: Secondary | ICD-10-CM | POA: Diagnosis not present

## 2020-10-16 DIAGNOSIS — Z4802 Encounter for removal of sutures: Secondary | ICD-10-CM | POA: Diagnosis not present

## 2020-10-16 DIAGNOSIS — Z760 Encounter for issue of repeat prescription: Secondary | ICD-10-CM | POA: Diagnosis not present

## 2020-10-18 ENCOUNTER — Other Ambulatory Visit: Payer: Self-pay | Admitting: Oncology

## 2020-11-05 DIAGNOSIS — E039 Hypothyroidism, unspecified: Secondary | ICD-10-CM | POA: Diagnosis not present

## 2020-11-05 DIAGNOSIS — I10 Essential (primary) hypertension: Secondary | ICD-10-CM | POA: Diagnosis not present

## 2020-11-05 DIAGNOSIS — E1143 Type 2 diabetes mellitus with diabetic autonomic (poly)neuropathy: Secondary | ICD-10-CM | POA: Diagnosis not present

## 2020-11-13 ENCOUNTER — Encounter: Payer: Self-pay | Admitting: Internal Medicine

## 2020-11-13 ENCOUNTER — Ambulatory Visit (INDEPENDENT_AMBULATORY_CARE_PROVIDER_SITE_OTHER): Payer: Medicare Other | Admitting: Internal Medicine

## 2020-11-13 ENCOUNTER — Other Ambulatory Visit: Payer: Self-pay

## 2020-11-13 VITALS — BP 124/70 | HR 77 | Ht 61.0 in | Wt 267.0 lb

## 2020-11-13 DIAGNOSIS — E1142 Type 2 diabetes mellitus with diabetic polyneuropathy: Secondary | ICD-10-CM

## 2020-11-13 DIAGNOSIS — E1165 Type 2 diabetes mellitus with hyperglycemia: Secondary | ICD-10-CM | POA: Diagnosis not present

## 2020-11-13 DIAGNOSIS — Z794 Long term (current) use of insulin: Secondary | ICD-10-CM | POA: Diagnosis not present

## 2020-11-13 DIAGNOSIS — E039 Hypothyroidism, unspecified: Secondary | ICD-10-CM

## 2020-11-13 LAB — POCT GLYCOSYLATED HEMOGLOBIN (HGB A1C): Hemoglobin A1C: 7.8 % — AB (ref 4.0–5.6)

## 2020-11-13 MED ORDER — DEXCOM G6 TRANSMITTER MISC: 1.0000 | 3 refills | Status: DC

## 2020-11-13 MED ORDER — DAPAGLIFLOZIN PROPANEDIOL 10 MG PO TABS: 10.0000 mg | ORAL_TABLET | Freq: Every day | ORAL | 3 refills | Status: DC

## 2020-11-13 MED ORDER — OMNIPOD DASH INTRO (GEN 4) KIT: 1.0000 | PACK | 0 refills | Status: DC

## 2020-11-13 MED ORDER — FIASP FLEXTOUCH 100 UNIT/ML ~~LOC~~ SOPN: 18.0000 [IU] | PEN_INJECTOR | Freq: Three times a day (TID) | SUBCUTANEOUS | 3 refills | Status: DC

## 2020-11-13 MED ORDER — LEVOTHYROXINE SODIUM 137 MCG PO TABS: 137.0000 ug | ORAL_TABLET | Freq: Every day | ORAL | 3 refills | Status: DC

## 2020-11-13 MED ORDER — DEXCOM G6 SENSOR MISC: 1.0000 | 3 refills | Status: DC

## 2020-11-13 MED ORDER — OMNIPOD DASH PODS (GEN 4) MISC: 1.0000 | 3 refills | Status: DC

## 2020-11-13 MED ORDER — LIRAGLUTIDE 18 MG/3ML ~~LOC~~ SOPN: 1.8000 mg | PEN_INJECTOR | Freq: Every day | SUBCUTANEOUS | 3 refills | Status: DC

## 2020-11-13 NOTE — Progress Notes (Signed)
Name: [name] [name]  Age/ Sex: 70 y.o., female   MRN/ DOB: [id number], 1950/02/22     PCP: Leeroy Cha, MD   Reason for Endocrinology Evaluation: Type 2 Diabetes Mellitus  Initial Endocrine Consultative Visit: 03/20/2018    PATIENT IDENTIFIER: [name] Carie is a 70 y.o. female with a past medical history of T2DM, hypothyroidism, HTN and Hx of breast Ca . The patient has followed with Endocrinology clinic since 03/20/2018 for consultative assistance with management of her diabetes.  DIABETIC HISTORY:  [name] [name] was diagnosed with T2DM many years ago, she is intolerant to Metformin. She was started on Jardiance January, 2020 but due to dizziness this was stopped in February, 2020.She was continued on Victoza and Lantus at the time.  Her hemoglobin A1c has ranged from 6.6% in 2017, peaking at 8.5%  In 2020 Farxiga started 08/2018   Prandial insulin started 02/2020  Hypothyroidism: She was diagnosed with hypothyroidism many years ago. She has been on LT-4 replacement since her diagnosis. She takes it appropriately   SUBJECTIVE:   During the last visit (05/30/2020): A1c 8.8 %. Adjusted MDI regimen,continued   [place]  and Victoza   Today (11/13/2020): Ms. [name] is here for a  follow up on diabetes management.   She checks her blood sugars 1 times daily. The patient has not had hypoglycemic episodes since the last clinic visit.      She had sx for entropion requiring B/L eye sx  Denies nausea or vomiting or diarrhea     Takes levothyroxine as prescribe    HOME DIABETES REGIMEN:  Victoza 1.8 mg daily Basaglar  55  units daily Farxiga 10 mg daily Fiasp 15 units with each meal  Levothyroxine 137 mcg daily    METER DOWNLOAD SUMMARY: 9/29-10/13/2022  Average Number Tests/Day = 1 Overall Mean FS Glucose = 214 Standard Deviation = 34  BG Ranges: Low = 177 High = 286   Hypoglycemic Events/30 Days: BG < 50 = 0 Episodes of symptomatic severe hypoglycemia =  0   DIABETIC COMPLICATIONS: Microvascular complications:  Neuropathy Denies: CKD, retinopathy Last eye exam: Completed 07/08/2020   Macrovascular complications:  Denies: CAD, PVD, CVA      HISTORY:  Past Medical History:  Past Medical History:  Diagnosis Date   Anxiety    Arthritis    Blood dyscrasia    low platelet count- sees [name] , [name] ( Note in EPIC from 07/2014) at [facility]) 2017   Left Breast   Cancer ([place])    left breast   Cirrhosis of liver ([place])    Cough    Diabetes mellitus without complication ([place])    Diabetic neuropathy ([place])    Gout    Gout    History of radiation therapy 10/22/15 - 12/08/15   left breast 50.4 Gy, boost to 10 Gy   Hypertension    Migraine    Multifactorial gait disorder 01/12/2013   Neuromuscular disorder ([place])    diabetic neuropathy   Neuropathy    Personal history of radiation therapy 2017   Left Breast Cancer   Sleep apnea    CPAP nightly   Spleen enlarged    Thyroid disease    Past Surgical History:  Past Surgical History:  Procedure Laterality Date   BREAST BIOPSY Left 08/04/2015   Procedure: LEFT BREAST BIOPSY WITH NEEDLE LOCALIZATION;  Surgeon: [name], MD;  Location: [facility];  Service: General;  Laterality:  Left;   BREAST DUCTAL SYSTEM EXCISION Left 08/04/2015   Procedure: LEFT EXCISION DUCTAL SYSTEM BREAST;  Surgeon: [name], MD;  Location: [facility];  Service: General;  Laterality: Left;   BREAST LUMPECTOMY Left 08/2015   BREAST LUMPECTOMY WITH AXILLARY LYMPH NODE BIOPSY Left 09/12/2015   Procedure: RE-EXCISION OF LEFT BREAST LUMPECTOMY WITH LEFT AXILLARY LYMPH NODE BIOPSY;  Surgeon: [name], MD;  Location: [facility];  Service: General;  Laterality: Left;   CHOLECYSTECTOMY     JOINT REPLACEMENT     left knee   JOINT REPLACEMENT  2017   right knee   REVERSE SHOULDER ARTHROPLASTY Right 09/07/2018   Procedure:  RIGHT REVERSE SHOULDER ARTHROPLASTY;  Surgeon: Meredith Pel, MD;  Location: [facility];  Service: Orthopedics;  Laterality: Right;   TOTAL KNEE ARTHROPLASTY Right 02/28/2015   Procedure: RIGHT TOTAL KNEE ARTHROPLASTY;  Surgeon: [name], MD;  Location: [facility];  Service: Orthopedics;  Laterality: Right;   TUBAL LIGATION     Social History:  reports that she has never smoked. She has never used smokeless tobacco. She reports that she does not drink alcohol and does not use drugs. Family History:  Family History  Problem Relation Age of Onset   Hypothyroidism Mother    Diabetes Father    Bone cancer Paternal Grandmother    Heart failure Paternal Grandmother    Cancer Maternal Grandmother    Heart failure Maternal Grandfather    Cancer Sister        GYN cancer   Hypothyroidism Sister      HOME MEDICATIONS: Allergies as of 11/13/2020       Reactions   Codeine Swelling   Influenza A (h1n1) Monoval Pf Anaphylaxis   Other Anaphylaxis   Does not know what this allergy agent could be; but denied she had any issues with Latex   Hydrocodone-acetaminophen Other (See Comments)   Lisinopril Other (See Comments)   Metformin Other (See Comments)   Pregabalin Other (See Comments)   Jardiance [empagliflozin] Other (See Comments)   dehydration        Medication List        Accurate as of November 13, 2020  8:43 AM. If you have any questions, ask your nurse or doctor.          Accu-Chek Aviva Plus test strip Generic drug: glucose blood Use as instructed to check blood sugar 2 times daily   acetaminophen 500 MG tablet Commonly known as: TYLENOL Take by mouth.   allopurinol 100 MG tablet Commonly known as: ZYLOPRIM Take 100 mg by mouth daily.   anastrozole 1 MG tablet Commonly known as: ARIMIDEX TAKE 1 TABLET DAILY   Basaglar KwikPen 100 UNIT/ML Inject 55 Units into the skin daily.   buPROPion 150 MG 24 hr tablet Commonly known as: WELLBUTRIN XL Take 150  mg by mouth every morning.   citalopram 40 MG tablet Commonly known as: CELEXA Take 40 mg by mouth daily.   dapagliflozin propanediol 10 MG Tabs tablet Commonly known as: Farxiga Take 1 tablet (10 mg total) by mouth daily before breakfast.   Fiasp FlexTouch 100 UNIT/ML FlexTouch Pen Generic drug: insulin aspart Inject 15 Units into the skin with breakfast, with lunch, and with evening meal.   fluticasone 50 MCG/ACT nasal spray Commonly known as: FLONASE Place 1 spray into both nostrils as needed.   gabapentin 600 MG tablet Commonly known as: NEURONTIN TAKE 1 TABLET IN THE MORNING AND 1 TABLET IN  THE AFTERNOON AND 2TABS AT BEDTIME   Insulin Pen Needle 30G X 5 MM Misc 1 Device by Does not apply route in the morning, at noon, in the evening, and at bedtime.   levothyroxine 137 MCG tablet Commonly known as: SYNTHROID TAKE 1 TABLET (137 MCG TOTAL) BY MOUTH DAILY BEFORE BREAKFAST.   liraglutide 18 MG/3ML Sopn Commonly known as: VICTOZA Inject 1.8 mg into the skin daily.   losartan 25 MG tablet Commonly known as: COZAAR Take 1 tablet (25 mg total) by mouth 2 (two) times daily.   methocarbamol 500 MG tablet Commonly known as: ROBAXIN Take 1 tablet (500 mg total) by mouth every 8 (eight) hours as needed for muscle spasms.   multivitamin with minerals tablet Take 1 tablet by mouth daily.   Tart Cherry Advanced Caps Take 1 capsule by mouth daily.   TURMERIC PO Take by mouth daily.   VITAMIN B-12 PO Take by mouth daily.   VITAMIN B-6 PO Take by mouth daily.   VITAMIN C PO Take by mouth daily.   VITAMIN D PO Take by mouth daily.         OBJECTIVE:   Vital Signs: BP 124/70 (BP Location: Right Arm, Patient Position: Sitting, Cuff Size: Small)   Pulse 77   Ht 5\' 1"  (1.549 m)   Wt 267 lb (121.1 kg)   SpO2 97%   BMI 50.45 kg/m   Wt Readings from Last 3 Encounters:  11/13/20 267 lb (121.1 kg)  05/30/20 278 lb 2 oz (126.2 kg)  02/29/20 280 lb 8 oz (127.2 kg)      Exam: General: Pt appears well and is in NAD  Lungs: Clear with good BS bilat with no rales, rhonchi, or wheezes  Heart: RRR , + systolic murmur  Extremities: Trace  pretibial edema.   Neuro: MS is good with appropriate affect, pt is alert and Ox3    DM foot exam:02/29/2020 The skin of the feet is without sores or ulcerations.but has a left foot callous formation at the left 1st MTH The pedal pulses are 2+ on right and 2+ on left. The sensation is absent to a screening 5.07, 10 gram monofilament bilaterally     DATA REVIEWED:  Lab Results  Component Value Date   HGBA1C 7.8 (A) 11/13/2020   HGBA1C 8.8 (A) 05/30/2020   HGBA1C 9.6 (A) 02/29/2020   Lab Results  Component Value Date   CREATININE 1.19 (H) 12/25/2019        Results for XAVIER, FOURNIER (MRN [id number]) as of 02/29/2020 15:31  Ref. Range 12/25/2019 13:15  Sodium Latest Ref Range: 135 - 145 mmol/L 137  Potassium Latest Ref Range: 3.5 - 5.1 mmol/L 4.1  Chloride Latest Ref Range: 98 - 111 mmol/L 103  CO2 Latest Ref Range: 22 - 32 mmol/L 25  Glucose Latest Ref Range: 70 - 99 mg/dL 372 (H)  BUN Latest Ref Range: 8 - 23 mg/dL 15  Creatinine Latest Ref Range: 0.44 - 1.00 mg/dL 1.19 (H)  Calcium Latest Ref Range: 8.9 - 10.3 mg/dL 10.0  Anion gap Latest Ref Range: 5 - 15  9  Alkaline Phosphatase Latest Ref Range: 38 - 126 U/L 118  Albumin Latest Ref Range: 3.5 - 5.0 g/dL 3.5  AST Latest Ref Range: 15 - 41 U/L 33  ALT Latest Ref Range: 0 - 44 U/L 18  Total Protein Latest Ref Range: 6.5 - 8.1 g/dL 7.5  Total Bilirubin Latest Ref Range: 0.3 - 1.2 mg/dL 0.4  GFR, Est Non African American Latest Ref Range: >60 mL/min 49 (L)   Results for DANNILYNN, GALLINA (MRN [id number]) as of 05/30/2020 14:07  Ref. Range 05/30/2020 09:38  TSH Latest Ref Range: 0.35 - 4.50 uIU/mL 1.85   ASSESSMENT / PLAN / RECOMMENDATIONS:   1) Type 2 Diabetes Mellitus, Sub-Optimally controlled, With neuropathic complications - Most recent A1c of 7.8 %.  Goal A1c < 7.0 %.    - A1c down from 8.8 %  - Praised pt on improved glycemic control  - Will increase Novolog  - Intolerant to Metformin  - Dexcom cost prohibitive ( cost $200 a month) 05/2020, she is interested in insulin pump, will prescribe Omnipod, referral placed for our CDE for training   MEDICATIONS: Continue Basaglar 55 units daily Continue Victoza 1.8 mg daily Continue  Farxiga 10 mg daily  Increase Fiasp to 18 units with each meal   EDUCATION / INSTRUCTIONS: BG monitoring instructions: Patient is instructed to check her blood sugars 3 times a day, before meal s Call [facility] if: BG persistently < 70  I reviewed the Rule of 15 for the treatment of hypoglycemia in detail with the patient. Literature supplied.      2)Hypothyroidism :    - Pt is clinically euthyroid  - TSh has been normal  - no change    Medications Continue Levothyroxine to 137 mcg daily      F/U in 6 months    Signed electronically by: Mack Guise, MD  [facility] Endocrinology  [address] [place], [place] [address] Phone: [phone] FAX: [phone]   CC: Leeroy Cha, MD [address] [place] [address] Phone: [phone]  Fax: [phone]  Return to Endocrinology clinic as below: No future appointments.

## 2020-11-13 NOTE — Patient Instructions (Addendum)
-   Continue Basaglar 55 units daily  - Continue Victoza 1.8 mg daily  - Continue Farxiga 10  mg daily  - Increase Novolog 18 units with each meal    HOW TO TREAT LOW BLOOD SUGARS (Blood sugar LESS THAN 70 MG/DL) Please follow the RULE OF 15 for the treatment of hypoglycemia treatment (when your (blood sugars are less than 70 mg/dL)   STEP 1: Take 15 grams of carbohydrates when your blood sugar is low, which includes:  3-4 GLUCOSE TABS  OR 3-4 OZ OF JUICE OR REGULAR SODA OR ONE TUBE OF GLUCOSE GEL    STEP 2: RECHECK blood sugar in 15 MINUTES STEP 3: If your blood sugar is still low at the 15 minute recheck --> then, go back to STEP 1 and treat AGAIN with another 15 grams of carbohydrates.

## 2020-11-17 ENCOUNTER — Telehealth: Payer: Self-pay | Admitting: Dietician

## 2020-11-17 NOTE — Telephone Encounter (Signed)
Returned patient call. She states that she wants an insulin pump. An Omnipod DASH was ordered by her MD along with a Dexcom G6 CGM on 11/13/2020. The order request was sent to [facility]. Discussed that getting the products takes time and the insurance company [employer] require additional information.  Discussed that she should call when she has the products and we can set up the appointment at that time.  Antonieta Iba, RD, LDN, CDCES

## 2020-11-21 ENCOUNTER — Telehealth: Payer: Self-pay | Admitting: Internal Medicine

## 2020-11-21 MED ORDER — OMNIPOD 5 DEXG7G6 INTRO GEN 5 KIT: 1.0000 | PACK | 0 refills | Status: DC

## 2020-11-21 MED ORDER — OMNIPOD 5 DEXG7G6 PODS GEN 5 MISC: 1.0000 | 3 refills | Status: DC

## 2020-11-21 NOTE — Telephone Encounter (Signed)
Omnipod 4 is too expensive. Pt can receive free Omnipod 5 kit, but Dexcom G6 needs to be changed to 5 in order for sensors to transmit and communicate with Omnipod 5.   [facility] (New Address) - [place], [place] AT Previously: [address], [place]  [address] [place] [place], [place] [address]   Pt contact [phone]

## 2020-11-21 NOTE — Telephone Encounter (Signed)
Spoke with patient and I will contact her Monday to see what [employer] is sending her as far as Dexcom.

## 2020-11-24 NOTE — Telephone Encounter (Signed)
Vm left for patient to find out which Omnipod she received.

## 2020-11-25 DIAGNOSIS — Z48810 Encounter for surgical aftercare following surgery on the sense organs: Secondary | ICD-10-CM | POA: Diagnosis not present

## 2020-12-10 DIAGNOSIS — H02003 Unspecified entropion of right eye, unspecified eyelid: Secondary | ICD-10-CM | POA: Diagnosis not present

## 2020-12-10 DIAGNOSIS — Z794 Long term (current) use of insulin: Secondary | ICD-10-CM | POA: Diagnosis not present

## 2020-12-10 DIAGNOSIS — H11233 Symblepharon, bilateral: Secondary | ICD-10-CM | POA: Diagnosis not present

## 2020-12-10 DIAGNOSIS — E119 Type 2 diabetes mellitus without complications: Secondary | ICD-10-CM | POA: Diagnosis not present

## 2020-12-10 DIAGNOSIS — Z961 Presence of intraocular lens: Secondary | ICD-10-CM | POA: Diagnosis not present

## 2020-12-10 DIAGNOSIS — H02006 Unspecified entropion of left eye, unspecified eyelid: Secondary | ICD-10-CM | POA: Diagnosis not present

## 2020-12-15 DIAGNOSIS — G4733 Obstructive sleep apnea (adult) (pediatric): Secondary | ICD-10-CM | POA: Diagnosis not present

## 2020-12-22 ENCOUNTER — Telehealth: Payer: Self-pay | Admitting: Internal Medicine

## 2020-12-22 NOTE — Telephone Encounter (Signed)
PT called regarding per pharmacy medicare requires the following [name] [name] MRN: [id number] Pharmacy Needs diagnoses codes on the following for insurance coverage to pay: Insulin Disposable Pump (OMNIPOD 5 G6 INTRO, GEN 5,) KIT Insulin Disposable Pump (OMNIPOD 5 G6 POD, GEN 5,) MISC and Continuous Blood Gluc Sensor (DEXCOM G6 SENSOR) MISC Continuous Blood Gluc Transmit (DEXCOM G6 TRANSMITTER) MISC  Be sent to:  [id number] [place], [place] - [address] AT [place] Phone:  [phone]  Fax:  [phone]

## 2020-12-23 MED ORDER — DEXCOM G6 SENSOR MISC: 1.0000 | 3 refills | Status: DC

## 2020-12-23 MED ORDER — DEXCOM G6 TRANSMITTER MISC: 1.0000 | 3 refills | Status: DC

## 2020-12-23 MED ORDER — OMNIPOD 5 DEXG7G6 PODS GEN 5 MISC: 1.0000 | 3 refills | Status: DC

## 2020-12-23 NOTE — Telephone Encounter (Signed)
Diagnosis code added to script and resent per message

## 2021-01-06 DIAGNOSIS — Z48810 Encounter for surgical aftercare following surgery on the sense organs: Secondary | ICD-10-CM | POA: Diagnosis not present

## 2021-01-06 DIAGNOSIS — Z7952 Long term (current) use of systemic steroids: Secondary | ICD-10-CM | POA: Diagnosis not present

## 2021-01-06 DIAGNOSIS — H11233 Symblepharon, bilateral: Secondary | ICD-10-CM | POA: Diagnosis not present

## 2021-01-07 DIAGNOSIS — H02056 Trichiasis without entropian left eye, unspecified eyelid: Secondary | ICD-10-CM | POA: Diagnosis not present

## 2021-01-07 DIAGNOSIS — Z961 Presence of intraocular lens: Secondary | ICD-10-CM | POA: Diagnosis not present

## 2021-01-07 DIAGNOSIS — H11233 Symblepharon, bilateral: Secondary | ICD-10-CM | POA: Diagnosis not present

## 2021-01-07 DIAGNOSIS — H02053 Trichiasis without entropian right eye, unspecified eyelid: Secondary | ICD-10-CM | POA: Diagnosis not present

## 2021-01-27 ENCOUNTER — Other Ambulatory Visit: Payer: Self-pay | Admitting: Oncology

## 2021-01-29 ENCOUNTER — Ambulatory Visit (INDEPENDENT_AMBULATORY_CARE_PROVIDER_SITE_OTHER): Payer: Medicare Other | Admitting: Internal Medicine

## 2021-01-29 ENCOUNTER — Encounter: Payer: Self-pay | Admitting: Internal Medicine

## 2021-01-29 ENCOUNTER — Other Ambulatory Visit: Payer: Self-pay

## 2021-01-29 VITALS — BP 134/62 | HR 87 | Resp 18 | Ht 63.0 in | Wt 261.0 lb

## 2021-01-29 DIAGNOSIS — K7460 Unspecified cirrhosis of liver: Secondary | ICD-10-CM | POA: Insufficient documentation

## 2021-01-29 DIAGNOSIS — Z794 Long term (current) use of insulin: Secondary | ICD-10-CM

## 2021-01-29 DIAGNOSIS — J342 Deviated nasal septum: Secondary | ICD-10-CM

## 2021-01-29 DIAGNOSIS — I10 Essential (primary) hypertension: Secondary | ICD-10-CM | POA: Diagnosis not present

## 2021-01-29 DIAGNOSIS — F3340 Major depressive disorder, recurrent, in remission, unspecified: Secondary | ICD-10-CM | POA: Diagnosis not present

## 2021-01-29 DIAGNOSIS — M1A9XX0 Chronic gout, unspecified, without tophus (tophi): Secondary | ICD-10-CM | POA: Diagnosis not present

## 2021-01-29 DIAGNOSIS — E039 Hypothyroidism, unspecified: Secondary | ICD-10-CM | POA: Diagnosis not present

## 2021-01-29 DIAGNOSIS — Z887 Allergy status to serum and vaccine status: Secondary | ICD-10-CM | POA: Insufficient documentation

## 2021-01-29 DIAGNOSIS — F5101 Primary insomnia: Secondary | ICD-10-CM | POA: Insufficient documentation

## 2021-01-29 DIAGNOSIS — J209 Acute bronchitis, unspecified: Secondary | ICD-10-CM | POA: Diagnosis not present

## 2021-01-29 DIAGNOSIS — J301 Allergic rhinitis due to pollen: Secondary | ICD-10-CM | POA: Insufficient documentation

## 2021-01-29 DIAGNOSIS — K521 Toxic gastroenteritis and colitis: Secondary | ICD-10-CM | POA: Insufficient documentation

## 2021-01-29 DIAGNOSIS — G4733 Obstructive sleep apnea (adult) (pediatric): Secondary | ICD-10-CM | POA: Diagnosis not present

## 2021-01-29 DIAGNOSIS — Z9989 Dependence on other enabling machines and devices: Secondary | ICD-10-CM | POA: Diagnosis not present

## 2021-01-29 DIAGNOSIS — Z853 Personal history of malignant neoplasm of breast: Secondary | ICD-10-CM | POA: Diagnosis not present

## 2021-01-29 DIAGNOSIS — E1142 Type 2 diabetes mellitus with diabetic polyneuropathy: Secondary | ICD-10-CM | POA: Diagnosis not present

## 2021-01-29 DIAGNOSIS — E785 Hyperlipidemia, unspecified: Secondary | ICD-10-CM | POA: Insufficient documentation

## 2021-01-29 DIAGNOSIS — K591 Functional diarrhea: Secondary | ICD-10-CM | POA: Insufficient documentation

## 2021-01-29 DIAGNOSIS — K7581 Nonalcoholic steatohepatitis (NASH): Secondary | ICD-10-CM | POA: Insufficient documentation

## 2021-01-29 DIAGNOSIS — M109 Gout, unspecified: Secondary | ICD-10-CM | POA: Insufficient documentation

## 2021-01-29 DIAGNOSIS — F331 Major depressive disorder, recurrent, moderate: Secondary | ICD-10-CM | POA: Insufficient documentation

## 2021-01-29 HISTORY — DX: Toxic gastroenteritis and colitis: K52.1

## 2021-01-29 MED ORDER — BUPROPION HCL ER (XL) 150 MG PO TB24: 150.0000 mg | ORAL_TABLET | Freq: Every morning | ORAL | 1 refills | Status: DC

## 2021-01-29 MED ORDER — AZITHROMYCIN 250 MG PO TABS: ORAL_TABLET | ORAL | 0 refills | Status: AC

## 2021-01-29 MED ORDER — BENZONATATE 100 MG PO CAPS: 100.0000 mg | ORAL_CAPSULE | Freq: Two times a day (BID) | ORAL | 0 refills | Status: DC | PRN

## 2021-01-29 MED ORDER — CITALOPRAM HYDROBROMIDE 40 MG PO TABS: 40.0000 mg | ORAL_TABLET | Freq: Every day | ORAL | 1 refills | Status: DC

## 2021-01-29 MED ORDER — LOSARTAN POTASSIUM 25 MG PO TABS: 25.0000 mg | ORAL_TABLET | Freq: Every day | ORAL | 1 refills | Status: DC

## 2021-01-29 NOTE — Assessment & Plan Note (Signed)
Uses CPAP regularly °

## 2021-01-29 NOTE — Assessment & Plan Note (Addendum)
On Gabapentin 600 mg in morning and afternoon, and 1200 mg at bedtime Used to follow Neurology, advised to contact for persistent symptoms 

## 2021-01-29 NOTE — Assessment & Plan Note (Signed)
On Levothyroxine 137 mcg QD Followed by Endocrinology 

## 2021-01-29 NOTE — Assessment & Plan Note (Addendum)
Well-controlled with Celexa and Wellbutrin

## 2021-01-29 NOTE — Patient Instructions (Addendum)
Please start taking Azithromycin and as needed Tessalon.  Please continue taking other medications as prescribed.  Please schedule appointment with Neurologist for your neuropathy.  Please consider getting Shingrix vaccine at your local pharmacy.

## 2021-01-29 NOTE — Progress Notes (Signed)
New Patient Office Visit  Subjective:  Patient ID: [name] [name], female    DOB: 22-Jul-1950  Age: 70 y.o. MRN: [id number]  CC:  Chief Complaint  Patient presents with   New Patient (Initial Visit)    New patient has been sob and had cough for 1.5 week did have fever coughing up yellow phlegm tried otc tylenol cold and flu flonase and tessalon pearls none help it     HPI [name] [name] is a 70 y.o. female with past medical history of HTN, type II DM with neuropathy, OSA, hypothyroidism, OA of multiple joints, gout, MDD, breast cancer s/p lumpectomy and morbid obesity who presents for establishing care.  HTN: Her BP was well controlled in the office today.  She takes Losartan.  She denies any headache, dizziness, chest pain, or palpitations currently.  Type II DM with HLD: Followed by endocrinology.  She is on Engineer, agricultural and ISS.  She also takes Victoza and [place].  Her last HbA1C was 7.8.  She is planned to get a insulin pump.  She denies any polyuria or polydipsia currently.  She takes gabapentin for neuropathy, but still has severe burning pain of bilateral feet.  She also has chronic numbness of bilateral feet.  She used to follow-up with neurology, but has not visited them for last few months.  She uses CPAP for OSA.  She complains of cough and mild dyspnea for about last 10 days.  She has tried taking Tylenol cold and flu, Flonase and Tessalon with no relief.  She denies any fever, chills or chest pain currently.  Her husband also has similar symptoms.  She has history of OA of multiple joints and has had multiple orthopedic surgeries in the past.  She has chronic pain from multiple joints, for which she takes Tylenol as needed.  She has history of DNS, for which she sees ENT specialist.  She recently had entropion repair surgery for her eyelid.  She sees ophthalmology and ENT specialist at [facility].  She has history of left breast cancer, s/p lumpectomy.  She is on anastrozole for  ER/PR positive breast cancer and follows up with Oncology.  She has remote history of gout, for which she takes allopurinol on as needed basis.  She denies any recent gout flareup. She agrees to stop taking allopurinol for now.  Past Medical History:  Diagnosis Date   Abdominal wall contusion 12/09/2011   Achilles tendon contracture, bilateral 06/03/2016   Anxiety    Arthritis    Blood dyscrasia    low platelet count- sees [name] , [name] ( Note in [place] from 07/2014) at [facility]) 2017   Left Breast   Cancer [facility])    left breast   Chest wall contusion 12/09/2011   Cirrhosis of liver ([place])    Cough    Diabetes mellitus without complication ([place])    Diabetic neuropathy ([place])    Diarrhea due to drug 01/29/2021   Gout    Gout    History of radiation therapy 10/22/15 - 12/08/15   left breast 50.4 Gy, boost to 10 Gy   Hypertension    Migraine    Multifactorial gait disorder 01/12/2013   Neuromuscular disorder ([place])    diabetic neuropathy   Neuropathy    Personal history of radiation therapy 2017   Left Breast Cancer   Posterior tibial tendon dysfunction (PTTD) of right lower extremity 10/09/2019   Sleep apnea  CPAP nightly   Spleen enlarged    Thyroid disease     Past Surgical History:  Procedure Laterality Date   BREAST BIOPSY Left 08/04/2015   Procedure: LEFT BREAST BIOPSY WITH NEEDLE LOCALIZATION;  Surgeon: [name], MD;  Location: [place];  Service: General;  Laterality: Left;   BREAST DUCTAL SYSTEM EXCISION Left 08/04/2015   Procedure: LEFT EXCISION DUCTAL SYSTEM BREAST;  Surgeon: [name], MD;  Location: [place];  Service: General;  Laterality: Left;   BREAST LUMPECTOMY Left 08/2015   BREAST LUMPECTOMY WITH AXILLARY LYMPH NODE BIOPSY Left 09/12/2015   Procedure: RE-EXCISION OF LEFT BREAST LUMPECTOMY WITH LEFT AXILLARY LYMPH NODE BIOPSY;  Surgeon: [name], MD;  Location: [place];  Service: General;  Laterality: Left;   CHOLECYSTECTOMY     JOINT REPLACEMENT     left knee   JOINT REPLACEMENT  2017   right knee   REVERSE SHOULDER ARTHROPLASTY Right 09/07/2018   Procedure: RIGHT REVERSE SHOULDER ARTHROPLASTY;  Surgeon: Meredith Pel, MD;  Location: [place];  Service: Orthopedics;  Laterality: Right;   TOTAL KNEE ARTHROPLASTY Right 02/28/2015   Procedure: RIGHT TOTAL KNEE ARTHROPLASTY;  Surgeon: [name], MD;  Location: [facility];  Service: Orthopedics;  Laterality: Right;   TUBAL LIGATION      Family History  Problem Relation Age of Onset   Hypothyroidism Mother    Diabetes Father    Bone cancer Paternal Grandmother    Heart failure Paternal Grandmother    Cancer Maternal Grandmother    Heart failure Maternal Grandfather    Cancer Sister        GYN cancer   Hypothyroidism Sister     Social History   Socioeconomic History   Marital status: Married    Spouse name: Not on file   Number of children: 3   Years of education: Not on file   Highest education level: Not on file  Occupational History   Occupation: Retired    Comment: Manufacturing systems engineer  Tobacco Use   Smoking status: Never   Smokeless tobacco: Never  Scientific laboratory technician Use: Never used  Substance and Sexual Activity   Alcohol use: No   Drug use: No   Sexual activity: Not on file  Other Topics Concern   Not on file  Social History Narrative   Not on file   Social Determinants of Health   Financial Resource Strain: Not on file  Food Insecurity: Not on file  Transportation Needs: Not on file  Physical Activity: Not on file  Stress: Not on file  Social Connections: Not on file  Intimate Partner Violence: Not on file    ROS Review of Systems  Constitutional:  Negative for chills and fever.  HENT:  Negative for congestion, sinus pressure and sinus pain.   Respiratory:  Positive for cough and shortness of breath.   Cardiovascular:  Negative for chest  pain and palpitations.  Gastrointestinal:  Negative for diarrhea, nausea and vomiting.  Genitourinary:  Negative for dysuria and hematuria.  Musculoskeletal:  Positive for arthralgias, back pain, gait problem and neck pain.  Skin:  Negative for rash.  Neurological:  Positive for weakness. Negative for dizziness.  Psychiatric/Behavioral:  Negative for agitation and behavioral problems.    Objective:   Today's Vitals: BP 134/62 (BP Location: Left Arm, Patient Position: Sitting, Cuff Size: Normal)    Pulse 87    Resp 18    Ht 5'  3" (1.6 m)    Wt 261 lb (118.4 kg)    SpO2 97%    BMI 46.23 kg/m   Physical Exam Vitals reviewed.  Constitutional:      General: She is not in acute distress.    Appearance: She is obese. She is not diaphoretic.  HENT:     Head: Normocephalic and atraumatic.     Nose: Nose normal.     Mouth/Throat:     Mouth: Mucous membranes are moist.  Eyes:     General: No scleral icterus.    Extraocular Movements: Extraocular movements intact.  Cardiovascular:     Rate and Rhythm: Normal rate and regular rhythm.     Pulses: Normal pulses.     Heart sounds: Normal heart sounds. No murmur heard. Pulmonary:     Breath sounds: Normal breath sounds. No wheezing or rales.  Musculoskeletal:     Cervical back: Neck supple. No tenderness.     Right lower leg: No edema.     Left lower leg: No edema.  Skin:    General: Skin is warm.     Findings: No rash.  Neurological:     General: No focal deficit present.     Mental Status: She is alert and oriented to person, place, and time.     Sensory: Sensory deficit present.     Motor: Weakness (4/5 in b/l LE) present.     Gait: Gait abnormal.  Psychiatric:        Mood and Affect: Mood normal.        Behavior: Behavior normal.    Assessment & Plan:   Problem List Items Addressed This Visit       Cardiovascular and Mediastinum   Essential hypertension    BP Readings from Last 1 Encounters:  01/29/21 134/62   Well-controlled with Losartan Counseled for compliance with the medications Advised DASH diet and moderate exercise/walking as tolerated      Relevant Medications   losartan (COZAAR) 25 MG tablet     Respiratory   Nasal septal deviation    Followed by ENT specialist at [facility]      OSA on CPAP    Uses CPAP regularly      Acute bronchitis    Cough and mild dyspnea for about the last 10 days Started empiric azithromycin Tessalon as needed for cough Would avoid steroids due to history of DM      Relevant Medications   azithromycin (ZITHROMAX) 250 MG tablet   benzonatate (TESSALON) 100 MG capsule     Endocrine   Diabetic polyneuropathy associated with type 2 diabetes mellitus (HCC)    On Gabapentin 600 mg in morning and afternoon, and 1200 mg at bedtime Used to follow Neurology, advised to contact for persistent symptoms      Relevant Medications   losartan (COZAAR) 25 MG tablet   buPROPion (WELLBUTRIN XL) 150 MG 24 hr tablet   citalopram (CELEXA) 40 MG tablet   Type 2 diabetes mellitus with diabetic polyneuropathy, with long-term current use of insulin ([place]) - Primary    Lab Results  Component Value Date   HGBA1C 7.8 (A) 11/13/2020   On Basaglar and ISS - followed by Endocrinology On Victoza and Farxiga Advised to follow diabetic diet On ARB Diabetic foot exam: Today Diabetic eye exam: Advised to follow up with Ophthalmology for diabetic eye exam      Relevant Medications   losartan (COZAAR) 25 MG tablet   buPROPion (WELLBUTRIN XL) 150 MG  24 hr tablet   citalopram (CELEXA) 40 MG tablet   Acquired hypothyroidism    On Levothyroxine 137 mcg QD Followed by Endocrinology        Other   Morbid obesity ([employer])    Diet modification and ambulation as tolerated On Victoza for type 2 DM      Gout    Has been taking Allopurinol PRN, although has not had gout flare in a long time, advised to stop Allopurinol for now - will restart as maintenance therapy if  she has recurrent flares      Recurrent major depression in remission ([place])    Well-controlled with Celexa and Wellbutrin      Relevant Medications   buPROPion (WELLBUTRIN XL) 150 MG 24 hr tablet   citalopram (CELEXA) 40 MG tablet   History of breast cancer    S/p lumpectomy, followed by Oncology On Anastrazole      Other Visit Diagnoses     Depression, recurrent ([place])       Relevant Medications   buPROPion (WELLBUTRIN XL) 150 MG 24 hr tablet   citalopram (CELEXA) 40 MG tablet       Outpatient Encounter Medications as of 01/29/2021  Medication Sig   acetaminophen (TYLENOL) 500 MG tablet Take by mouth.   albuterol (VENTOLIN HFA) 108 (90 Base) MCG/ACT inhaler 1 puff as needed   Ascorbic Acid (VITAMIN C PO) Take by mouth daily.   azithromycin (ZITHROMAX) 250 MG tablet Take 2 tablets on day 1, then 1 tablet daily on days 2 through 5   benzonatate (TESSALON) 100 MG capsule Take 1 capsule (100 mg total) by mouth 2 (two) times daily as needed for cough.   Continuous Blood Gluc Sensor (DEXCOM G6 SENSOR) MISC 1 Device by Does not apply route as directed.   Continuous Blood Gluc Transmit (DEXCOM G6 TRANSMITTER) MISC 1 Device by Does not apply route as directed.   Cyanocobalamin (VITAMIN B-12 PO) Take by mouth daily.   dapagliflozin propanediol (FARXIGA) 10 MG TABS tablet Take 1 tablet (10 mg total) by mouth daily before breakfast.   fluticasone (FLONASE) 50 MCG/ACT nasal spray Place 1 spray into both nostrils as needed.    gabapentin (NEURONTIN) 600 MG tablet TAKE 1 TABLET IN THE MORNING AND 1 TABLET IN THE AFTERNOON AND 2TABS AT BEDTIME   glucose blood (ACCU-CHEK AVIVA PLUS) test strip Use as instructed to check blood sugar 2 times daily   insulin aspart (FIASP FLEXTOUCH) 100 UNIT/ML FlexTouch Pen Inject 18 Units into the skin with breakfast, with lunch, and with evening meal.   Insulin Disposable Pump (OMNIPOD 5 G6 INTRO, GEN 5,) KIT 1 Device by Does not apply route every 3 (three)  days.   Insulin Disposable Pump (OMNIPOD 5 G6 POD, GEN 5,) MISC 1 Device by Does not apply route every 3 (three) days.   Insulin Glargine (BASAGLAR KWIKPEN) 100 UNIT/ML Inject 55 Units into the skin daily.   Insulin Pen Needle 30G X 5 MM MISC 1 Device by Does not apply route in the morning, at noon, in the evening, and at bedtime.   levothyroxine (SYNTHROID) 137 MCG tablet Take 1 tablet (137 mcg total) by mouth daily before breakfast.   liraglutide (VICTOZA) 18 MG/3ML SOPN Inject 1.8 mg into the skin daily.   methocarbamol (ROBAXIN) 500 MG tablet Take 1 tablet (500 mg total) by mouth every 8 (eight) hours as needed for muscle spasms.   Misc Natural Products (TART CHERRY ADVANCED) CAPS Take 1 capsule by mouth  daily.   Multiple Vitamins-Minerals (MULTIVITAMIN WITH MINERALS) tablet Take 1 tablet by mouth daily.   Pyridoxine HCl (VITAMIN B-6 PO) Take by mouth daily.   TURMERIC PO Take by mouth daily.   VITAMIN D PO Take by mouth daily.   [DISCONTINUED] allopurinol (ZYLOPRIM) 100 MG tablet Take 100 mg by mouth daily.   [DISCONTINUED] anastrozole (ARIMIDEX) 1 MG tablet TAKE 1 TABLET DAILY   [DISCONTINUED] buPROPion (WELLBUTRIN XL) 150 MG 24 hr tablet Take 150 mg by mouth every morning.   [DISCONTINUED] citalopram (CELEXA) 40 MG tablet Take 40 mg by mouth daily.   [DISCONTINUED] losartan (COZAAR) 25 MG tablet Take 1 tablet (25 mg total) by mouth 2 (two) times daily.   buPROPion (WELLBUTRIN XL) 150 MG 24 hr tablet Take 1 tablet (150 mg total) by mouth every morning.   citalopram (CELEXA) 40 MG tablet Take 1 tablet (40 mg total) by mouth daily.   losartan (COZAAR) 25 MG tablet Take 1 tablet (25 mg total) by mouth daily.   No facility-administered encounter medications on file as of 01/29/2021.    Follow-up: Return in about 4 months (around 05/30/2021) for DM and HTN.   [name], MD

## 2021-01-29 NOTE — Assessment & Plan Note (Signed)
Followed by ENT specialist at [place]

## 2021-01-29 NOTE — Assessment & Plan Note (Signed)
BP Readings from Last 1 Encounters:  01/29/21 134/62   Well-controlled with Losartan Counseled for compliance with the medications Advised DASH diet and moderate exercise/walking as tolerated

## 2021-01-30 DIAGNOSIS — Z853 Personal history of malignant neoplasm of breast: Secondary | ICD-10-CM | POA: Insufficient documentation

## 2021-01-30 DIAGNOSIS — J209 Acute bronchitis, unspecified: Secondary | ICD-10-CM | POA: Insufficient documentation

## 2021-01-30 NOTE — Assessment & Plan Note (Signed)
Diet modification and ambulation as tolerated On Victoza for type 2 DM

## 2021-01-30 NOTE — Assessment & Plan Note (Signed)
Has been taking Allopurinol PRN, although has not had gout flare in a long time, advised to stop Allopurinol for now - will restart as maintenance therapy if she has recurrent flares

## 2021-01-30 NOTE — Assessment & Plan Note (Signed)
Lab Results  Component Value Date   HGBA1C 7.8 (A) 11/13/2020    On Basaglar and ISS - followed by Endocrinology On Victoza and Farxiga Advised to follow diabetic diet On ARB Diabetic foot exam: Today Diabetic eye exam: Advised to follow up with Ophthalmology for diabetic eye exam

## 2021-01-30 NOTE — Assessment & Plan Note (Signed)
Cough and mild dyspnea for about the last 10 days Started empiric azithromycin Tessalon as needed for cough Would avoid steroids due to history of DM

## 2021-01-30 NOTE — Assessment & Plan Note (Signed)
S/p lumpectomy, followed by Oncology On Anastrazole

## 2021-02-11 ENCOUNTER — Other Ambulatory Visit: Payer: Self-pay

## 2021-02-11 ENCOUNTER — Telehealth: Payer: Self-pay | Admitting: Internal Medicine

## 2021-02-11 ENCOUNTER — Telehealth: Payer: Self-pay

## 2021-02-11 ENCOUNTER — Ambulatory Visit (INDEPENDENT_AMBULATORY_CARE_PROVIDER_SITE_OTHER): Payer: Medicare Other

## 2021-02-11 ENCOUNTER — Other Ambulatory Visit: Payer: Self-pay | Admitting: Internal Medicine

## 2021-02-11 DIAGNOSIS — Z0000 Encounter for general adult medical examination without abnormal findings: Secondary | ICD-10-CM | POA: Diagnosis not present

## 2021-02-11 DIAGNOSIS — Z789 Other specified health status: Secondary | ICD-10-CM

## 2021-02-11 DIAGNOSIS — J209 Acute bronchitis, unspecified: Secondary | ICD-10-CM

## 2021-02-11 MED ORDER — DEXCOM G6 RECEIVER DEVI
0 refills | Status: DC
Start: 2021-02-11 — End: 2021-02-26

## 2021-02-11 MED ORDER — PROMETHAZINE-DM 6.25-15 MG/5ML PO SYRP
5.0000 mL | ORAL_SOLUTION | Freq: Four times a day (QID) | ORAL | 0 refills | Status: DC | PRN
Start: 2021-02-11 — End: 2021-03-04

## 2021-02-11 NOTE — Telephone Encounter (Signed)
Pharmacy called needing for prescription for the [facility] Receiver on this patient. This is not listed under medications.

## 2021-02-11 NOTE — Patient Instructions (Addendum)
[name] [name] , Thank you for taking time to come for your Medicare Wellness Visit. I appreciate your ongoing commitment to your health goals. Please review the following plan we discussed and let me know if I can assist you in the future.   These are the goals we discussed:  Goals   None     This is a list of the screening recommended for you and due dates:  Health Maintenance  Topic Date Due   Pneumonia Vaccine (1 - PCV) Never done   Eye exam for diabetics  Never done   Hepatitis C Screening: USPSTF Recommendation to screen - Ages 36-79 yo.  Never done   Zoster (Shingles) Vaccine (1 of 2) Never done   Colon Cancer Screening  Never done   COVID-19 Vaccine (1) 02/14/2021*   Flu Shot  05/01/2021*   Hemoglobin A1C  05/14/2021   Complete foot exam   01/29/2022   Tetanus Vaccine  03/20/2022   Mammogram  06/05/2022   DEXA scan (bone density measurement)  Completed   HPV Vaccine  Aged Out  *Topic was postponed. The date shown is not the original due date.  [name] [name]  Health Maintenance, Female Adopting a healthy lifestyle and getting preventive care are important in promoting health and wellness. Ask your health care provider about: The right schedule for you to have regular tests and exams. Things you can do on your own to prevent diseases and keep yourself healthy. What should I know about diet, weight, and exercise? Eat a healthy diet  Eat a diet that includes plenty of vegetables, fruits, low-fat dairy products, and lean protein. Do not eat a lot of foods that are high in solid fats, added sugars, or sodium. Maintain a healthy weight Body mass index (BMI) is used to identify weight problems. It estimates body fat based on height and weight. Your health care provider can help determine your BMI and help you achieve or maintain a healthy weight. Get regular exercise Get regular exercise. This is one of the most important things you can do for your health. Most adults should: Exercise for at  least 150 minutes each week. The exercise should increase your heart rate and make you sweat (moderate-intensity exercise). Do strengthening exercises at least twice a week. This is in addition to the moderate-intensity exercise. Spend less time sitting. Even light physical activity can be beneficial. Watch cholesterol and blood lipids Have your blood tested for lipids and cholesterol at 71 years of age, then have this test every 5 years. Have your cholesterol levels checked more often if: Your lipid or cholesterol levels are high. You are older than 71 years of age. You are at high risk for heart disease. What should I know about cancer screening? Depending on your health history and family history, you [place] need to have cancer screening at various ages. This [place] include screening for: Breast cancer. Cervical cancer. Colorectal cancer. Skin cancer. Lung cancer. What should I know about heart disease, diabetes, and high blood pressure? Blood pressure and heart disease High blood pressure causes heart disease and increases the risk of stroke. This is more likely to develop in people who have high blood pressure readings or are overweight. Have your blood pressure checked: Every 3-5 years if you are 67-51 years of age. Every year if you are 55 years old or older. Diabetes Have regular diabetes screenings. This checks your fasting blood sugar level. Have the screening done: Once every three years after age 63 if  you are at a normal weight and have a low risk for diabetes. More often and at a younger age if you are overweight or have a high risk for diabetes. What should I know about preventing infection? Hepatitis B If you have a higher risk for hepatitis B, you should be screened for this virus. Talk with your health care provider to find out if you are at risk for hepatitis B infection. Hepatitis C Testing is recommended for: Everyone born from [birth year] through 1965. Anyone with known risk  factors for hepatitis C. Sexually transmitted infections (STIs) Get screened for STIs, including gonorrhea and chlamydia, if: You are sexually active and are younger than 71 years of age. You are older than 71 years of age and your health care provider tells you that you are at risk for this type of infection. Your sexual activity has changed since you were last screened, and you are at increased risk for chlamydia or gonorrhea. Ask your health care provider if you are at risk. Ask your health care provider about whether you are at high risk for HIV. Your health care provider [place] recommend a prescription medicine to help prevent HIV infection. If you choose to take medicine to prevent HIV, you should first get tested for HIV. You should then be tested every 3 months for as long as you are taking the medicine. Pregnancy If you are about to stop having your period (premenopausal) and you [place] become pregnant, seek counseling before you get pregnant. Take 400 to 800 micrograms (mcg) of folic acid every day if you become pregnant. Ask for birth control (contraception) if you want to prevent pregnancy. Osteoporosis and menopause Osteoporosis is a disease in which the bones lose minerals and strength with aging. This can result in bone fractures. If you are 46 years old or older, or if you are at risk for osteoporosis and fractures, ask your health care provider if you should: Be screened for bone loss. Take a calcium or vitamin D supplement to lower your risk of fractures. Be given hormone replacement therapy (HRT) to treat symptoms of menopause. Follow these instructions at home: Alcohol use Do not drink alcohol if: Your health care provider tells you not to drink. You are pregnant, [place] be pregnant, or are planning to become pregnant. If you drink alcohol: Limit how much you have to: 0-1 drink a day. Know how much alcohol is in your drink. In the [place] one drink equals one 12 oz bottle of beer  (355 mL), one 5 oz glass of wine (148 mL), or one 1 oz glass of hard liquor (44 mL). Lifestyle Do not use any products that contain nicotine or tobacco. These products include cigarettes, chewing tobacco, and vaping devices, such as e-cigarettes. If you need help quitting, ask your health care provider. Do not use street drugs. Do not share needles. Ask your health care provider for help if you need support or information about quitting drugs. General instructions Schedule regular health, dental, and eye exams. Stay current with your vaccines. Tell your health care provider if: You often feel depressed. You have ever been abused or do not feel safe at home. Summary Adopting a healthy lifestyle and getting preventive care are important in promoting health and wellness. Follow your health care provider's instructions about healthy diet, exercising, and getting tested or screened for diseases. Follow your health care provider's instructions on monitoring your cholesterol and blood pressure. This information is not intended to replace advice given to  you by your health care provider. Make sure you discuss any questions you have with your health care provider. Document Revised: 06/09/2020 Document Reviewed: 06/09/2020 Elsevier Patient Education  [place].

## 2021-02-11 NOTE — Telephone Encounter (Signed)
VM FULL to let pt know

## 2021-02-11 NOTE — Addendum Note (Signed)
Addended by: Argentina Ponder D on: 02/11/2021 02:07 PM   Modules accepted: Orders

## 2021-02-11 NOTE — Progress Notes (Signed)
Subjective:   [name] [name] is a 71 y.o. female who presents for Medicare Annual (Subsequent) preventive examination. I connected with  Debe A Capp on 02/11/21 by a audio enabled telemedicine application and verified that I am speaking with the correct person using two identifiers.  Patient Location: Home  Provider Location: Office/Clinic  I discussed the limitations of evaluation and management by telemedicine. The patient expressed understanding and agreed to proceed.  Review of Systems    Defer to PCP Cardiac Risk Factors include: advanced age (>74mn, >>41women);diabetes mellitus;hypertension     Objective:    Today's Vitals   02/11/21 0935  PainSc: 0-No pain   There is no height or weight on file to calculate BMI.  Advanced Directives 02/11/2021 08/30/2018 03/18/2018 09/12/2015 09/08/2015 08/28/2015 08/19/2015  Does Patient Have a Medical Advance Directive? _0  No No  Does patient want to make changes to medical advance directive? No - Patient declined - - - - - -  Would patient like information on creating a medical advance directive? No - Patient declined Yes (MAU/Ambulatory/Procedural Areas - Information given) No - Patient declined No - patient declined information - No - patient declined information -    Current Medications (verified) Outpatient Encounter Medications as of 02/11/2021  Medication Sig   acetaminophen (TYLENOL) 500 MG tablet Take by mouth.   albuterol (VENTOLIN HFA) 108 (90 Base) MCG/ACT inhaler 1 puff as needed   anastrozole (ARIMIDEX) 1 MG tablet TAKE 1 TABLET BY MOUTH EVERY DAY   Ascorbic Acid (VITAMIN C PO) Take by mouth daily.   benzonatate (TESSALON) 100 MG capsule Take 1 capsule (100 mg total) by mouth 2 (two) times daily as needed for cough.   buPROPion (WELLBUTRIN XL) 150 MG 24 hr tablet Take 1 tablet (150 mg total) by mouth every morning.   citalopram (CELEXA) 40 MG tablet Take 1 tablet (40 mg total) by mouth daily.   Continuous Blood Gluc  Sensor (DEXCOM G6 SENSOR) MISC 1 Device by Does not apply route as directed.   Continuous Blood Gluc Transmit (DEXCOM G6 TRANSMITTER) MISC 1 Device by Does not apply route as directed.   Cyanocobalamin (VITAMIN B-12 PO) Take by mouth daily.   dapagliflozin propanediol (FARXIGA) 10 MG TABS tablet Take 1 tablet (10 mg total) by mouth daily before breakfast.   fluticasone (FLONASE) 50 MCG/ACT nasal spray Place 1 spray into both nostrils as needed.    gabapentin (NEURONTIN) 600 MG tablet TAKE 1 TABLET IN THE MORNING AND 1 TABLET IN THE AFTERNOON AND 2TABS AT BEDTIME   glucose blood (ACCU-CHEK AVIVA PLUS) test strip Use as instructed to check blood sugar 2 times daily   insulin aspart (FIASP FLEXTOUCH) 100 UNIT/ML FlexTouch Pen Inject 18 Units into the skin with breakfast, with lunch, and with evening meal.   Insulin Disposable Pump (OMNIPOD 5 G6 INTRO, GEN 5,) KIT 1 Device by Does not apply route every 3 (three) days.   Insulin Disposable Pump (OMNIPOD 5 G6 POD, GEN 5,) MISC 1 Device by Does not apply route every 3 (three) days.   Insulin Glargine (BASAGLAR KWIKPEN) 100 UNIT/ML Inject 55 Units into the skin daily.   Insulin Pen Needle 30G X 5 MM MISC 1 Device by Does not apply route in the morning, at noon, in the evening, and at bedtime.   levothyroxine (SYNTHROID) 137 MCG tablet Take 1 tablet (137 mcg total) by mouth daily before breakfast.   liraglutide (VICTOZA) 18 MG/3ML SOPN Inject 1.8  mg into the skin daily.   losartan (COZAAR) 25 MG tablet Take 1 tablet (25 mg total) by mouth daily.   methocarbamol (ROBAXIN) 500 MG tablet Take 1 tablet (500 mg total) by mouth every 8 (eight) hours as needed for muscle spasms.   Misc Natural Products (TART CHERRY ADVANCED) CAPS Take 1 capsule by mouth daily.   Multiple Vitamins-Minerals (MULTIVITAMIN WITH MINERALS) tablet Take 1 tablet by mouth daily.   Pyridoxine HCl (VITAMIN B-6 PO) Take by mouth daily.   TURMERIC PO Take by mouth daily.   VITAMIN D PO Take  by mouth daily.   No facility-administered encounter medications on file as of 02/11/2021.    Allergies (verified) Codeine, Influenza a (h1n1) monoval pf, Other, Hydrocodone-acetaminophen, Lisinopril, Metformin, Pregabalin, and Jardiance [empagliflozin]   History: Past Medical History:  Diagnosis Date   Abdominal wall contusion 12/09/2011   Achilles tendon contracture, bilateral 06/03/2016   Anxiety    Arthritis    Blood dyscrasia    low platelet count- sees [name] , [name] ( Note in EPIC from 07/2014) at [facility]) 2017   Left Breast   Cancer [facility])    left breast   Chest wall contusion 12/09/2011   Cirrhosis of liver ([place])    Cough    Diabetes mellitus without complication ([place])    Diabetic neuropathy ([place])    Diarrhea due to drug 01/29/2021   Gout    Gout    History of radiation therapy 10/22/15 - 12/08/15   left breast 50.4 Gy, boost to 10 Gy   Hypertension    Migraine    Multifactorial gait disorder 01/12/2013   Neuromuscular disorder ([place])    diabetic neuropathy   Neuropathy    Personal history of radiation therapy 2017   Left Breast Cancer   Posterior tibial tendon dysfunction (PTTD) of right lower extremity 10/09/2019   Sleep apnea    CPAP nightly   Spleen enlarged    Thyroid disease    Past Surgical History:  Procedure Laterality Date   ANKLE FUSION Right 2022   BREAST BIOPSY Left 08/04/2015   Procedure: LEFT BREAST BIOPSY WITH NEEDLE LOCALIZATION;  Surgeon: [name], MD;  Location: [place];  Service: General;  Laterality: Left;   BREAST DUCTAL SYSTEM EXCISION Left 08/04/2015   Procedure: LEFT EXCISION DUCTAL SYSTEM BREAST;  Surgeon: [name], MD;  Location: [place];  Service: General;  Laterality: Left;   BREAST LUMPECTOMY Left 08/2015   BREAST LUMPECTOMY WITH AXILLARY LYMPH NODE BIOPSY Left 09/12/2015   Procedure: RE-EXCISION OF LEFT BREAST LUMPECTOMY WITH LEFT AXILLARY  LYMPH NODE BIOPSY;  Surgeon: [name], MD;  Location: [place];  Service: General;  Laterality: Left;   CHOLECYSTECTOMY     EYE SURGERY Bilateral 2022   Lashes growing backwards into the eye   JOINT REPLACEMENT     left knee   JOINT REPLACEMENT  2017   right knee   REVERSE SHOULDER ARTHROPLASTY Right 09/07/2018   Procedure: RIGHT REVERSE SHOULDER ARTHROPLASTY;  Surgeon: Meredith Pel, MD;  Location: [place];  Service: Orthopedics;  Laterality: Right;   TOTAL KNEE ARTHROPLASTY Right 02/28/2015   Procedure: RIGHT TOTAL KNEE ARTHROPLASTY;  Surgeon: [name], MD;  Location: [facility];  Service: Orthopedics;  Laterality: Right;   TUBAL LIGATION     Family History  Problem Relation Age of Onset   Hypothyroidism Mother    Alzheimer's disease Mother  Diabetes Father    Cancer Sister        GYN cancer   Hypothyroidism Sister    Heart disease Brother    Arthritis Son    Diabetes Son    Arthritis Son    Cancer Maternal Grandmother    Heart failure Maternal Grandfather    Bone cancer Paternal Grandmother    Heart failure Paternal Grandmother    Social History   Socioeconomic History   Marital status: Married    Spouse name: edward   Number of children: 3   Years of education: 12th   Highest education level: Not on file  Occupational History   Occupation: Retired    Comment: Manufacturing systems engineer  Tobacco Use   Smoking status: Never   Smokeless tobacco: Never  Scientific laboratory technician Use: Never used  Substance and Sexual Activity   Alcohol use: No   Drug use: No   Sexual activity: Yes  Other Topics Concern   Not on file  Social History Narrative   Not on file   Social Determinants of Health   Financial Resource Strain: Low Risk    Difficulty of Paying Living Expenses: Not very hard  Food Insecurity: No Food Insecurity   Worried About Charity fundraiser in the Last Year: Never true   [place] in the Last Year: Never true   Transportation Needs: No Transportation Needs   Lack of Transportation (Medical): No   Lack of Transportation (Non-Medical): No  Physical Activity: Insufficiently Active   Days of Exercise per Week: 3 days   Minutes of Exercise per Session: 30 min  Stress: No Stress Concern Present   Feeling of Stress : Only a little  Social Connections: Moderately Integrated   Frequency of Communication with Friends and Family: Three times a week   Frequency of Social Gatherings with Friends and Family: Never   Attends Religious Services: More than 4 times per year   Active Member of [employer] or Organizations: No   Attends Music therapist: Never   Marital Status: Married    Tobacco Counseling Counseling given: Not Answered   Clinical Intake:  Pre-visit preparation completed: Yes  Pain : No/denies pain Pain Score: 0-No pain     Nutritional Risks: None Diabetes: Yes CBG done?: No Did pt. bring in CBG monitor from home?: No  How often do you need to have someone help you when you read instructions, pamphlets, or other written materials from your doctor or pharmacy?: 1 - Never What is the last grade level you completed in school?: 12  Diabetic?Nutrition Risk Assessment:  Has the patient had any N/V/D within the last 2 months?  No  Does the patient have any non-healing wounds?  No  Has the patient had any unintentional weight loss or weight gain?  No   Diabetes:  Is the patient diabetic?  Yes  If diabetic, was a CBG obtained today?  No  Did the patient bring in their glucometer from home?  No  How often do you monitor your CBG's? Twice daily.   Financial Strains and Diabetes Management:  Are you having any financial strains with the device, your supplies or your medication? No .  Does the patient want to be seen by Chronic Care Management for management of their diabetes?  Yes  Would the patient like to be referred to a Nutritionist or for Diabetic Management?  No    Diabetic Exams:  Diabetic Eye Exam: Overdue  for diabetic eye exam. Pt has been advised about the importance in completing this exam. Patient advised to call and schedule an eye exam. Diabetic Foot Exam: Completed 01/29/2021    Interpreter Needed?: No  Information entered by :: [place] of Daily Living In your present state of health, do you have any difficulty performing the following activities: 02/11/2021 01/29/2021  Hearing? N N  Vision? Y N  Difficulty concentrating or making decisions? N N  Walking or climbing stairs? Y N  Dressing or bathing? N N  Doing errands, shopping? N N  Preparing Food and eating ? N -  Using the Toilet? N -  In the past six months, have you accidently leaked urine? Y -  Do you have problems with loss of bowel control? N -  Managing your Medications? N -  Managing your Finances? N -  Housekeeping or managing your Housekeeping? N -  Some recent data might be hidden    Patient Care Team: [name], MD as PCP - General (Internal Medicine)  Indicate any recent Medical Services you [place] have received from other than [facility] providers in the past year (date Barberi be approximate).     Assessment:   This is a routine wellness examination for Ceniya.  Hearing/Vision screen No results found.  Dietary issues and exercise activities discussed: Current Exercise Habits: Home exercise routine, Type of exercise: walking, Time (Minutes): 30, Frequency (Times/Week): 3, Weekly Exercise (Minutes/Week): 90, Intensity: Mild, Exercise limited by: neurologic condition(s);orthopedic condition(s)   Goals Addressed   None   Depression Screen PHQ 2/9 Scores 02/11/2021 01/29/2021 09/28/2017 03/07/2017 09/01/2016 05/20/2016 08/28/2015  PHQ - 2 Score 4 0 _0 0 0  PHQ- 9 Score 14 0 _1 - -    Fall Risk Fall Risk  02/11/2021 01/29/2021 09/28/2017 09/01/2016 05/20/2016  Falls in the past year? 1 0 No Yes No  Number falls in past yr: 0 0 - 1 -  Injury with  Fall? 1 0 - No -  Risk for fall due to : History of fall(s);Impaired mobility;Orthopedic patient No Fall Risks - - -  Follow up Falls prevention discussed;Falls evaluation completed Falls evaluation completed - - -    FALL RISK PREVENTION PERTAINING TO THE HOME:  Any stairs in or around the home? Yes  If so, are there any without handrails? Yes  Home free of loose throw rugs in walkways, pet beds, electrical cords, etc? Yes  Adequate lighting in your home to reduce risk of falls? Yes   ASSISTIVE DEVICES UTILIZED TO PREVENT FALLS:  Life alert? No  Use of a cane, walker or w/c? Yes  Grab bars in the bathroom? Yes  Shower chair or bench in shower? Yes  Elevated toilet seat or a handicapped toilet? Yes   Cognitive Function:     6CIT Screen 02/11/2021  What Year? 0 points  What month? 0 points  What time? 0 points  Count back from 20 0 points  Months in reverse 0 points  Repeat phrase 2 points  Total Score 2    Immunizations Immunization History  Administered Date(s) Administered   Hepatitis B, adult 03/01/2014, 04/01/2014, 08/30/2014   Tdap 03/20/2012    TDAP status: Up to date  Flu Vaccine status: Declined, Education has been provided regarding the importance of this vaccine but patient still declined. Advised [place] receive this vaccine at local pharmacy or Health Dept. Aware to provide a copy of the vaccination record if obtained  from local pharmacy or Health Dept. Verbalized acceptance and understanding. PT is allergic Pneumococcal vaccine status: Due, Education has been provided regarding the importance of this vaccine. Advised Callies receive this vaccine at local pharmacy or Health Dept. Aware to provide a copy of the vaccination record if obtained from local pharmacy or Health Dept. Verbalized acceptance and understanding.  Covid-19 vaccine status: Declined, Education has been provided regarding the importance of this vaccine but patient still declined. Advised [place] receive  this vaccine at local pharmacy or Health Dept.or vaccine clinic. Aware to provide a copy of the vaccination record if obtained from local pharmacy or Health Dept. Verbalized acceptance and understanding. Pt is allergic Qualifies for Shingles Vaccine? Yes   Zostavax completed No   Shingrix Completed?: No.    Education has been provided regarding the importance of this vaccine. Patient has been advised to call insurance company to determine out of pocket expense if they have not yet received this vaccine. Advised Hoen also receive vaccine at local pharmacy or Health Dept. Verbalized acceptance and understanding.  Screening Tests Health Maintenance  Topic Date Due   Pneumonia Vaccine 36+ Years old (1 - PCV) Never done   OPHTHALMOLOGY EXAM  Never done   Hepatitis C Screening  Never done   Zoster Vaccines- Shingrix (1 of 2) Never done   COLONOSCOPY (Pts 45-23yr Insurance coverage will need to be confirmed)  Never done   COVID-19 Vaccine (1) 02/14/2021 (Originally 11/27/1950)   INFLUENZA VACCINE  05/01/2021 (Originally 09/01/2020)   HEMOGLOBIN A1C  05/14/2021   FOOT EXAM  01/29/2022   TETANUS/TDAP  03/20/2022   MAMMOGRAM  06/05/2022   DEXA SCAN  Completed   HPV VACCINES  Aged Out    Health Maintenance  Health Maintenance Due  Topic Date Due   Pneumonia Vaccine 71 Years old (1 - PCV) Never done   OPHTHALMOLOGY EXAM  Never done   Hepatitis C Screening  Never done   Zoster Vaccines- Shingrix (1 of 2) Never done   COLONOSCOPY (Pts 45-432yrInsurance coverage will need to be confirmed)  Never done    Colorectal cancer screening: Referral to GI placed 02/10/2022. Pt aware the office will call re: appt.  Mammogram status: Completed 06/04/2020. Repeat every year  Bone Density status: Completed 07/09/2019. Results reflect: Bone density results: OSTEOPOROSIS. Repeat every 3 years.  Lung Cancer Screening: (Low Dose CT Chest recommended if Age 549-80ears, 30 pack-year currently smoking OR have  quit w/in 15years.) does not qualify.   Lung Cancer Screening Referral: n/a  Additional Screening:  Hepatitis C Screening: does not qualify; Completed not high rick   Vision Screening: Recommended annual ophthalmology exams for early detection of glaucoma and other disorders of the eye. Is the patient up to date with their annual eye exam?  Yes  Who is the provider or what is the name of the office in which the patient [facility]ends [facility] If pt is not established with a provider, would they like to be referred to a provider to establish care? No .   Dental Screening: Recommended annual dental exams for proper oral hygiene  Community Resource Referral / Chronic Care Management: CRR required this visit?  No   CCM required this visit?  No      Plan:     I have personally reviewed and noted the following in the patients chart:   Medical and social history Use of alcohol, tobacco or illicit drugs  Current medications and supplements including  opioid prescriptions.  Functional ability and status Nutritional status Physical activity Advanced directives List of other physicians Hospitalizations, surgeries, and ER visits in previous 12 months Vitals Screenings to include cognitive, depression, and falls Referrals and appointments  In addition, I have reviewed and discussed with patient certain preventive protocols, quality metrics, and best practice recommendations. A written personalized care plan for preventive services as well as general preventive health recommendations were provided to patient.     Earline May[place], [place]   02/11/2021   Nurse Notes:  Ms. Maroney , Thank you for taking time to come for your Medicare Wellness Visit. I appreciate your ongoing commitment to your health goals. Please review the following plan we discussed and let me know if I can assist you in the future.   These are the goals we discussed:  Goals   None     This  is a list of the screening recommended for you and due dates:  Health Maintenance  Topic Date Due   Pneumonia Vaccine (1 - PCV) Never done   Eye exam for diabetics  Never done   Hepatitis C Screening: USPSTF Recommendation to screen - Ages 62-79 yo.  Never done   Zoster (Shingles) Vaccine (1 of 2) Never done   Colon Cancer Screening  Never done   COVID-19 Vaccine (1) 02/14/2021*   Flu Shot  05/01/2021*   Hemoglobin A1C  05/14/2021   Complete foot exam   01/29/2022   Tetanus Vaccine  03/20/2022   Mammogram  06/05/2022   DEXA scan (bone density measurement)  Completed   HPV Vaccine  Aged Out  *Topic was postponed. The date shown is not the original due date.

## 2021-02-11 NOTE — Telephone Encounter (Signed)
Receiver sent

## 2021-02-11 NOTE — Telephone Encounter (Signed)
Patient called said the Tessalon not working.

## 2021-02-17 ENCOUNTER — Telehealth: Payer: Self-pay

## 2021-02-17 NOTE — Telephone Encounter (Signed)
° °  Telephone encounter was:  Unsuccessful.  02/17/2021 Name: Delle Andrzejewski Liao MRN: [id number] DOB: [birth year]  Unsuccessful outbound call made today to assist with:  Financial Difficulties related to    Outreach Attempt:  1st Attempt Could not leave a message at this time due to voicemail being full    [place], Care Management  [phone] [address], [place], [place] [address] Phone: [phone] Email: [email or web address]

## 2021-02-18 ENCOUNTER — Telehealth: Payer: Self-pay

## 2021-02-18 ENCOUNTER — Ambulatory Visit
Admission: EM | Admit: 2021-02-18 | Discharge: 2021-02-18 | Disposition: A | Discharge status: Home / Self Care | Payer: Medicare Other | Attending: Family Medicine | Admitting: Family Medicine

## 2021-02-18 ENCOUNTER — Other Ambulatory Visit: Payer: Self-pay

## 2021-02-18 DIAGNOSIS — R040 Epistaxis: Secondary | ICD-10-CM | POA: Diagnosis not present

## 2021-02-18 DIAGNOSIS — R531 Weakness: Secondary | ICD-10-CM

## 2021-02-18 MED ORDER — OXYMETAZOLINE HCL 0.05 % NA SOLN
2.0000 | Freq: Once | NASAL | Status: AC
  Administered 2021-02-18: 2 via NASAL

## 2021-02-18 NOTE — Discharge Instructions (Signed)
Go to the emergency department if your symptoms are worsening anytime.  Call the ear nose and throat specialist tomorrow morning when they open and see if someone can work you in to be seen

## 2021-02-18 NOTE — Telephone Encounter (Signed)
° °  Telephone encounter was:  Successful.  02/18/2021 Name: [name] [name] MRN: [id number] DOB: Jul 08, 1950  [name] [name] is a 71 y.o. year old female who is a primary care patient of [name], MD . The community resource team was consulted for assistance with Financial Difficulties related to medical. bills  Care guide performed the following interventions: Patient provided with information about care guide support team and interviewed to confirm resource needs.pt satated she was having financial stain paying for medical bills and medicine  Follow Up Plan:  No further follow up planned at this time. The patient has been provided with needed resources. By email.    [place], Care Management  [phone] [address], [place], [place] [address] Phone: [phone] Email: [email or web address]

## 2021-02-18 NOTE — ED Triage Notes (Signed)
Pt also reports dizziness

## 2021-02-18 NOTE — ED Triage Notes (Signed)
Pt presents with nose bleeds intermittently for a "while" began again this afternoon, bleeding currently controlled

## 2021-02-18 NOTE — ED Provider Notes (Signed)
[facility]    CSN: [id number] Arrival date & time: 02/18/21  1649      History   Chief Complaint Chief Complaint  Patient presents with   Epistaxis    HPI [name] [name] is a 71 y.o. female.   Presenting today with acute exacerbation of chronic recurrent epistaxis.  She states that she started late morning this morning with a significant nosebleed, at times large clots being passed and other times pouring bright red blood.  She has been using Afrin, 2 doses so far today since onset and has kept packing material pressed into nostril.  She states she had an episode to this severity last year and had to have a posterior vessel cauterized.  She has an appointment with ear nose and throat next week following up on this issue and was able to be seen today for it.  She feels weak, dizzy at this point.  Denies chest pain, shortness of breath, syncope.   Past Medical History:  Diagnosis Date   Abdominal wall contusion 12/09/2011   Achilles tendon contracture, bilateral 06/03/2016   Anxiety    Arthritis    Blood dyscrasia    low platelet count- sees [name] , [name] ( Note in EPIC from 07/2014) at [facility]) 2017   Left Breast   Cancer [facility])    left breast   Chest wall contusion 12/09/2011   Cirrhosis of liver ([place])    Cough    Diabetes mellitus without complication ([place])    Diabetic neuropathy ([place])    Diarrhea due to drug 01/29/2021   Gout    Gout    History of radiation therapy 10/22/15 - 12/08/15   left breast 50.4 Gy, boost to 10 Gy   Hypertension    Migraine    Multifactorial gait disorder 01/12/2013   Neuromuscular disorder ([place])    diabetic neuropathy   Neuropathy    Personal history of radiation therapy 2017   Left Breast Cancer   Posterior tibial tendon dysfunction (PTTD) of right lower extremity 10/09/2019   Sleep apnea    CPAP nightly   Spleen enlarged    Thyroid disease     Patient Active Problem List    Diagnosis Date Noted   History of breast cancer 01/30/2021   Acute bronchitis 01/30/2021   Allergic rhinitis due to pollen 01/29/2021   Allergy to influenza vaccine 01/29/2021   Functional diarrhea 01/29/2021   Gout 01/29/2021   Hyperlipidemia 47/10/6281   Nonalcoholic steatohepatitis ([facility]) 01/29/2021   Primary insomnia 01/29/2021   Recurrent major depression in remission ([place]) 01/29/2021   Unspecified cirrhosis of liver ([place]) 01/29/2021   Nasal septal deviation 03/11/2020   Arthritis of right subtalar joint 10/09/2019   Primary osteoarthritis of right ankle 10/09/2019   Epistaxis, recurrent 09/26/2019   OSA on CPAP 09/26/2019   Acquired hypothyroidism 05/21/2019   Arthritis of shoulder 09/07/2018   Bilateral hearing loss 08/01/2018   Vertigo 08/01/2018   Type 2 diabetes mellitus with diabetic polyneuropathy, with long-term current use of insulin ([place]) 11/16/2016   Bilateral lower extremity edema 09/01/2016   Diabetic polyneuropathy associated with type 2 diabetes mellitus ([place]) 06/03/2016   Malignant neoplasm of upper-outer quadrant of left breast in female, estrogen receptor positive ([place]) 08/19/2015   Osteoarthritis of right knee 02/28/2015   Status post total right knee replacement 02/28/2015   Splenomegaly 07/30/2014   Thrombocytopenia ([place]) 07/30/2014   Multifactorial gait disorder 01/12/2013   Recurrent falls  01/12/2013   Morbid obesity ([place]) 01/12/2013   Degenerative arthritis of spine 01/12/2013   Essential hypertension 09/05/2012    Past Surgical History:  Procedure Laterality Date   ANKLE FUSION Right 2022   BREAST BIOPSY Left 08/04/2015   Procedure: LEFT BREAST BIOPSY WITH NEEDLE LOCALIZATION;  Surgeon: [name], MD;  Location: [place];  Service: General;  Laterality: Left;   BREAST DUCTAL SYSTEM EXCISION Left 08/04/2015   Procedure: LEFT EXCISION DUCTAL SYSTEM BREAST;  Surgeon: [name], MD;  Location: [place];   Service: General;  Laterality: Left;   BREAST LUMPECTOMY Left 08/2015   BREAST LUMPECTOMY WITH AXILLARY LYMPH NODE BIOPSY Left 09/12/2015   Procedure: RE-EXCISION OF LEFT BREAST LUMPECTOMY WITH LEFT AXILLARY LYMPH NODE BIOPSY;  Surgeon: [name], MD;  Location: [place];  Service: General;  Laterality: Left;   CHOLECYSTECTOMY     EYE SURGERY Bilateral 2022   Lashes growing backwards into the eye   JOINT REPLACEMENT     left knee   JOINT REPLACEMENT  2017   right knee   REVERSE SHOULDER ARTHROPLASTY Right 09/07/2018   Procedure: RIGHT REVERSE SHOULDER ARTHROPLASTY;  Surgeon: Meredith Pel, MD;  Location: [place];  Service: Orthopedics;  Laterality: Right;   TOTAL KNEE ARTHROPLASTY Right 02/28/2015   Procedure: RIGHT TOTAL KNEE ARTHROPLASTY;  Surgeon: [name], MD;  Location[facility];  Service: Orthopedics;  Laterality: Right;   TUBAL LIGATION      OB History   No obstetric history on file.      Home Medications    Prior to Admission medications   Medication Sig Start Date End Date Taking? Authorizing Provider  acetaminophen (TYLENOL) 500 MG tablet Take by mouth. 10/18/19   [provider]  albuterol (VENTOLIN HFA) 108 (90 Base) MCG/ACT inhaler 1 puff as needed 02/26/20   [provider]  anastrozole (ARIMIDEX) 1 MG tablet TAKE 1 TABLET BY MOUTH EVERY DAY 01/29/21   Magrinat, Virgie Dad, MD  Ascorbic Acid (VITAMIN C PO) Take by mouth daily.    [provider]  benzonatate (TESSALON) 100 MG capsule Take 1 capsule (100 mg total) by mouth 2 (two) times daily as needed for cough. 01/29/21   [name], MD  buPROPion (WELLBUTRIN XL) 150 MG 24 hr tablet Take 1 tablet (150 mg total) by mouth every morning. 01/29/21   [name], MD  citalopram (CELEXA) 40 MG tablet Take 1 tablet (40 mg total) by mouth daily. 01/29/21   [name], MD  Continuous Blood Gluc Receiver (DEXCOM G6 RECEIVER) DEVI Use as directed 02/11/21    Shamleffer, Melanie Crazier, MD  Continuous Blood Gluc Sensor (DEXCOM G6 SENSOR) MISC 1 Device by Does not apply route as directed. 12/23/20   Shamleffer, Melanie Crazier, MD  Continuous Blood Gluc Transmit (DEXCOM G6 TRANSMITTER) MISC 1 Device by Does not apply route as directed. 12/23/20   Shamleffer, Melanie Crazier, MD  Cyanocobalamin (VITAMIN B-12 PO) Take by mouth daily.    [provider]  dapagliflozin propanediol (FARXIGA) 10 MG TABS tablet Take 1 tablet (10 mg total) by mouth daily before breakfast. 11/13/20   Shamleffer, Melanie Crazier, MD  fluticasone (FLONASE) 50 MCG/ACT nasal spray Place 1 spray into both nostrils as needed.  05/02/18   [provider]  gabapentin (NEURONTIN) 600 MG tablet TAKE 1 TABLET IN THE MORNING AND 1 TABLET IN THE AFTERNOON AND 2TABS AT BEDTIME    [provider]  glucose blood (ACCU-CHEK AVIVA PLUS) test strip Use as instructed to check blood sugar 2 times daily 07/10/20   Shamleffer, Melanie Crazier, MD  insulin aspart (FIASP FLEXTOUCH) 100 UNIT/ML FlexTouch Pen Inject 18 Units into the skin with breakfast, with lunch, and with evening meal. 11/13/20   Shamleffer, Melanie Crazier, MD  Insulin Disposable Pump (OMNIPOD 5 G6 INTRO, GEN 5,) KIT 1 Device by Does not apply route every 3 (three) days. 11/21/20   Shamleffer, Melanie Crazier, MD  Insulin Disposable Pump (OMNIPOD 5 G6 POD, GEN 5,) MISC 1 Device by Does not apply route every 3 (three) days. 12/23/20   Shamleffer, Melanie Crazier, MD  Insulin Glargine (BASAGLAR KWIKPEN) 100 UNIT/ML Inject 55 Units into the skin daily. 02/29/20   Shamleffer, Melanie Crazier, MD  Insulin Pen Needle 30G X 5 MM MISC 1 Device by Does not apply route in the morning, at noon, in the evening, and at bedtime. 05/30/20   Shamleffer, Melanie Crazier, MD  levothyroxine (SYNTHROID) 137 MCG tablet Take 1 tablet (137 mcg total) by mouth daily before breakfast. 11/13/20   Shamleffer, Melanie Crazier, MD  liraglutide  (VICTOZA) 18 MG/3ML SOPN Inject 1.8 mg into the skin daily. 11/13/20   Shamleffer, Melanie Crazier, MD  losartan (COZAAR) 25 MG tablet Take 1 tablet (25 mg total) by mouth daily. 01/29/21   [name], MD  methocarbamol (ROBAXIN) 500 MG tablet Take 1 tablet (500 mg total) by mouth every 8 (eight) hours as needed for muscle spasms. 10/20/18   Magnant, Gerrianne Scale, PA-C  Misc Natural Products (TART CHERRY ADVANCED) CAPS Take 1 capsule by mouth daily.    [provider]  Multiple Vitamins-Minerals (MULTIVITAMIN WITH MINERALS) tablet Take 1 tablet by mouth daily.    [provider]  promethazine-dextromethorphan (PROMETHAZINE-DM) 6.25-15 MG/5ML syrup Take 5 mLs by mouth 4 (four) times daily as needed for cough. 02/11/21   [name], MD  Pyridoxine HCl (VITAMIN B-6 PO) Take by mouth daily.    [provider]  TURMERIC PO Take by mouth daily.    [provider]  VITAMIN D PO Take by mouth daily.    [provider]    Family History Family History  Problem Relation Age of Onset   Hypothyroidism Mother    Alzheimer's disease Mother    Diabetes Father    Cancer Sister        GYN cancer   Hypothyroidism Sister    Heart disease Brother    Arthritis Son    Diabetes Son    Arthritis Son    Cancer Maternal Grandmother    Heart failure Maternal Grandfather    Bone cancer Paternal Grandmother    Heart failure Paternal Grandmother     Social History Social History   Tobacco Use   Smoking status: Never   Smokeless tobacco: Never  Vaping Use   Vaping Use: Never used  Substance Use Topics   Alcohol use: No   Drug use: No     Allergies   Codeine, Influenza a (h1n1) monoval pf, Other, Hydrocodone-acetaminophen, Lisinopril, Metformin, Pregabalin, and Jardiance [empagliflozin]   Review of Systems Review of Systems Per HPI  Physical Exam Triage Vital Signs ED Triage Vitals  Enc Vitals Group     BP 02/18/21 1748 (!) 157/85     Pulse  Rate 02/18/21 1748 99     Resp 02/18/21 1748 18     Temp 02/18/21 1748 98.7 F (37.1 C)     Temp src --  SpO2 02/18/21 1748 94 %     Weight --      Height --      Head Circumference --      Peak Flow --      Pain Score 02/18/21 1745 0     Pain Loc --      Pain Edu? --      Excl. in [place]? --    No data found.  Updated Vital Signs BP (!) 157/85    Pulse 99    Temp 98.7 F (37.1 C)    Resp 18    SpO2 94%   Visual Acuity Right Eye Distance:   Left Eye Distance:   Bilateral Distance:    Right Eye Near:   Left Eye Near:    Bilateral Near:     Physical Exam Vitals and nursing note reviewed.  Constitutional:      Appearance: Normal appearance. She is not ill-appearing.  HENT:     Head: Atraumatic.     Right Ear: Tympanic membrane normal.     Left Ear: Tympanic membrane normal.     Nose:     Comments: Right nare benign-appearing.  Left nare with slow drainage of bright red blood that appears to be coming from posterior portion of nasal passage.  No obvious obstructions, abrasions    Mouth/Throat:     Mouth: Mucous membranes are moist.     Pharynx: No posterior oropharyngeal erythema.  Eyes:     Extraocular Movements: Extraocular movements intact.     Conjunctiva/sclera: Conjunctivae normal.  Cardiovascular:     Rate and Rhythm: Normal rate and regular rhythm.     Heart sounds: Normal heart sounds.  Pulmonary:     Effort: Pulmonary effort is normal.     Breath sounds: Normal breath sounds.  Musculoskeletal:        General: Normal range of motion.     Cervical back: Normal range of motion and neck supple.  Skin:    General: Skin is warm and dry.  Neurological:     Mental Status: She is alert and oriented to person, place, and time.  Psychiatric:        Mood and Affect: Mood normal.        Thought Content: Thought content normal.        Judgment: Judgment normal.     UC Treatments / Results  Labs (all labs ordered are listed, but only abnormal results are  displayed) Labs Reviewed  CBC WITH DIFFERENTIAL/PLATELET    EKG   Radiology No results found.  Procedures Procedures (including critical care time)  Medications Ordered in UC Medications  oxymetazoline (AFRIN) 0.05 % nasal spray 2 spray (2 sprays Left Nare Given 02/18/21 1827)    Initial Impression / Assessment and Plan / UC Course  I have reviewed the triage vital signs and the nursing notes.  Pertinent labs & imaging results that were available during my care of the patient were reviewed by me and considered in my medical decision making (see chart for details).     Discussed given duration of symptoms, significance of bleeding and her now systemic symptoms of weakness, mild dizziness that she should go to the emergency department for further management as Afrin, pressure has not been effective and she has required cauterization in the past.  She declines going to the emergency department today so we will obtain blood counts, pack with rolled gauze and continue to monitor closely at home.  Call ENT in the  morning to see if can be worked in but if worsening anytime needs to go to the emergency department.  She expresses understanding and agreement with plan.  45-minute spent today with direct patient care, coordination of care and education  Final Clinical Impressions(s) / UC Diagnoses   Final diagnoses:  Epistaxis  Weakness     Discharge Instructions      Go to the emergency department if your symptoms are worsening anytime.  Call the ear nose and throat specialist tomorrow morning when they open and see if someone can work you in to be seen    ED Prescriptions   None    PDMP not reviewed this encounter.   Volney American, [place] 02/18/21 1848

## 2021-02-19 LAB — CBC WITH DIFFERENTIAL/PLATELET
Basophils Absolute: 0 10*3/uL (ref 0.0–0.2)
Basos: 1 %
EOS (ABSOLUTE): 0.1 10*3/uL (ref 0.0–0.4)
Eos: 2 %
Hematocrit: 36.6 % (ref 34.0–46.6)
Hemoglobin: 12 g/dL (ref 11.1–15.9)
Immature Grans (Abs): 0 10*3/uL (ref 0.0–0.1)
Immature Granulocytes: 1 %
Lymphocytes Absolute: 0.8 10*3/uL (ref 0.7–3.1)
Lymphs: 21 %
MCH: 28.8 pg (ref 26.6–33.0)
MCHC: 32.8 g/dL (ref 31.5–35.7)
MCV: 88 fL (ref 79–97)
Monocytes Absolute: 0.4 10*3/uL (ref 0.1–0.9)
Monocytes: 10 %
Neutrophils Absolute: 2.4 10*3/uL (ref 1.4–7.0)
Neutrophils: 65 %
Platelets: 98 10*3/uL — CL (ref 150–450)
RBC: 4.16 x10E6/uL (ref 3.77–5.28)
RDW: 15.4 % (ref 11.7–15.4)
WBC: 3.7 10*3/uL (ref 3.4–10.8)

## 2021-02-26 ENCOUNTER — Other Ambulatory Visit: Payer: Self-pay | Admitting: Internal Medicine

## 2021-02-26 DIAGNOSIS — R040 Epistaxis: Secondary | ICD-10-CM | POA: Diagnosis not present

## 2021-02-26 DIAGNOSIS — J3489 Other specified disorders of nose and nasal sinuses: Secondary | ICD-10-CM | POA: Insufficient documentation

## 2021-02-26 DIAGNOSIS — J342 Deviated nasal septum: Secondary | ICD-10-CM | POA: Diagnosis not present

## 2021-02-26 DIAGNOSIS — H11233 Symblepharon, bilateral: Secondary | ICD-10-CM | POA: Diagnosis not present

## 2021-02-26 DIAGNOSIS — J343 Hypertrophy of nasal turbinates: Secondary | ICD-10-CM | POA: Insufficient documentation

## 2021-02-26 DIAGNOSIS — L121 Cicatricial pemphigoid: Secondary | ICD-10-CM | POA: Diagnosis not present

## 2021-03-01 ENCOUNTER — Other Ambulatory Visit: Payer: Self-pay | Admitting: Internal Medicine

## 2021-03-02 ENCOUNTER — Other Ambulatory Visit: Payer: Self-pay

## 2021-03-02 MED ORDER — DEXCOM G6 RECEIVER DEVI: 0 refills | Status: DC

## 2021-03-04 ENCOUNTER — Other Ambulatory Visit: Payer: Self-pay | Admitting: Internal Medicine

## 2021-03-04 ENCOUNTER — Telehealth: Payer: Self-pay

## 2021-03-04 ENCOUNTER — Other Ambulatory Visit (HOSPITAL_COMMUNITY): Payer: Self-pay

## 2021-03-04 ENCOUNTER — Other Ambulatory Visit: Payer: Self-pay

## 2021-03-04 DIAGNOSIS — J209 Acute bronchitis, unspecified: Secondary | ICD-10-CM

## 2021-03-04 MED ORDER — DEXCOM G6 SENSOR MISC: 3 refills | Status: DC

## 2021-03-04 MED ORDER — DEXCOM G6 TRANSMITTER MISC: 3 refills | Status: DC

## 2021-03-04 MED ORDER — DEXCOM G6 RECEIVER DEVI: 0 refills | Status: DC

## 2021-03-04 NOTE — Telephone Encounter (Signed)
This medication has been sent to pharmacy

## 2021-03-04 NOTE — Telephone Encounter (Signed)
Attempted to contact patient to let her know that Dexcom has been sent to [facility] with diagnosis code and directions.  Vm was full.

## 2021-03-04 NOTE — Telephone Encounter (Signed)
Patient need med refill promethazine-dextromethorphan (PROMETHAZINE-DM) 6.25-15 MG/5ML syrup

## 2021-03-04 NOTE — Telephone Encounter (Signed)
No PA is needed for the [facility] G6 transmitter & sensors.  The copay for each is $141.00.

## 2021-03-10 DIAGNOSIS — H11233 Symblepharon, bilateral: Secondary | ICD-10-CM | POA: Diagnosis not present

## 2021-03-26 ENCOUNTER — Other Ambulatory Visit: Payer: Self-pay | Admitting: Internal Medicine

## 2021-03-28 ENCOUNTER — Other Ambulatory Visit: Payer: Self-pay | Admitting: Internal Medicine

## 2021-04-01 DIAGNOSIS — H02056 Trichiasis without entropian left eye, unspecified eyelid: Secondary | ICD-10-CM | POA: Diagnosis not present

## 2021-04-01 DIAGNOSIS — Z794 Long term (current) use of insulin: Secondary | ICD-10-CM | POA: Diagnosis not present

## 2021-04-01 DIAGNOSIS — H35373 Puckering of macula, bilateral: Secondary | ICD-10-CM | POA: Diagnosis not present

## 2021-04-01 DIAGNOSIS — Z961 Presence of intraocular lens: Secondary | ICD-10-CM | POA: Insufficient documentation

## 2021-04-01 DIAGNOSIS — H3581 Retinal edema: Secondary | ICD-10-CM | POA: Diagnosis not present

## 2021-04-01 DIAGNOSIS — H02053 Trichiasis without entropian right eye, unspecified eyelid: Secondary | ICD-10-CM | POA: Insufficient documentation

## 2021-04-01 DIAGNOSIS — L121 Cicatricial pemphigoid: Secondary | ICD-10-CM | POA: Diagnosis not present

## 2021-04-01 DIAGNOSIS — Z205 Contact with and (suspected) exposure to viral hepatitis: Secondary | ICD-10-CM | POA: Diagnosis not present

## 2021-04-01 DIAGNOSIS — E119 Type 2 diabetes mellitus without complications: Secondary | ICD-10-CM | POA: Diagnosis not present

## 2021-04-01 DIAGNOSIS — Z1159 Encounter for screening for other viral diseases: Secondary | ICD-10-CM | POA: Diagnosis not present

## 2021-04-08 DIAGNOSIS — L121 Cicatricial pemphigoid: Secondary | ICD-10-CM | POA: Diagnosis not present

## 2021-04-21 ENCOUNTER — Encounter: Payer: Self-pay | Admitting: Internal Medicine

## 2021-04-21 ENCOUNTER — Ambulatory Visit (INDEPENDENT_AMBULATORY_CARE_PROVIDER_SITE_OTHER): Payer: Medicare Other | Admitting: Internal Medicine

## 2021-04-21 ENCOUNTER — Other Ambulatory Visit: Payer: Self-pay

## 2021-04-21 DIAGNOSIS — J0110 Acute frontal sinusitis, unspecified: Secondary | ICD-10-CM

## 2021-04-21 MED ORDER — AMOXICILLIN-POT CLAVULANATE 875-125 MG PO TABS: 1.0000 | ORAL_TABLET | Freq: Two times a day (BID) | ORAL | 0 refills | Status: DC

## 2021-04-21 NOTE — Progress Notes (Signed)
?  ? ?Virtual Visit via Telephone Note  ? ?This visit type was conducted due to national recommendations for restrictions regarding the COVID-19 Pandemic (e.g. social distancing) in an effort to limit this patient's exposure and mitigate transmission in our community.  Due to her co-morbid illnesses, this patient is at least at moderate risk for complications without adequate follow up.  This format is felt to be most appropriate for this patient at this time.  The patient did not have access to video technology/had technical difficulties with video requiring transitioning to audio format only (telephone).  All issues noted in this document were discussed and addressed.  No physical exam could be performed with this format. ? ?Evaluation Performed:  Follow-up visit ? ?Date:  04/21/2021  ? ?ID:  [name] [name], DOB January 05, 1951, MRN [id number] ? ?Patient Location: Home ?Provider Location: Office/Clinic ? ?Participants: Patient ?Location of Patient: Home ?Location of Provider: Telehealth ?Consent was obtain for visit to be over via telehealth. ?I verified that I am speaking with the correct person using two identifiers. ? ?PCP:  [name], MD  ? ?Chief Complaint: Nasal congestion and fever ? ?History of Present Illness:   ? ?[name] [name] is a 71 y.o. female who has a telemetry visit for complaint of nasal congestion, fever, cough and sinus pressure related headache for the last 2 days.  She denies any recent worsening of dyspnea or wheezing.  She has tried taking Promethazine DM and Tessalon for cough with some relief.  Of note, she has recently started taking mycophenolate for her eyes. ? ?The patient does not have symptoms concerning for COVID-19 infection (fever, chills, cough, or new shortness of breath).  ? ?Past Medical, Surgical, Social History, Allergies, and Medications have been Reviewed. ? ?Past Medical History:  ?Diagnosis Date  ? Abdominal wall contusion 12/09/2011  ? Achilles tendon contracture, bilateral  06/03/2016  ? Anxiety   ? Arthritis   ? Blood dyscrasia   ? low platelet count- sees [name] , [name] ( Note in EPIC from 07/2014) at [facility]  ? Breast cancer [facility]) 2017  ? Left Breast  ? Cancer ([place])   ? left breast  ? Chest wall contusion 12/09/2011  ? Cirrhosis of liver ([place])   ? Cough   ? Diabetes mellitus without complication ([place])   ? Diabetic neuropathy ([place])   ? Diarrhea due to drug 01/29/2021  ? Gout   ? Gout   ? History of radiation therapy 10/22/15 - 12/08/15  ? left breast 50.4 Gy, boost to 10 Gy  ? Hypertension   ? Migraine   ? Multifactorial gait disorder 01/12/2013  ? Neuromuscular disorder ([place])   ? diabetic neuropathy  ? Neuropathy   ? Personal history of radiation therapy 2017  ? Left Breast Cancer  ? Posterior tibial tendon dysfunction (PTTD) of right lower extremity 10/09/2019  ? Sleep apnea   ? CPAP nightly  ? Spleen enlarged   ? Thyroid disease   ? ?Past Surgical History:  ?Procedure Laterality Date  ? ANKLE FUSION Right 2022  ? BREAST BIOPSY Left 08/04/2015  ? Procedure: LEFT BREAST BIOPSY WITH NEEDLE LOCALIZATION;  Surgeon: [name], MD;  Location: [place];  Service: General;  Laterality: Left;  ? BREAST DUCTAL SYSTEM EXCISION Left 08/04/2015  ? Procedure: LEFT EXCISION DUCTAL SYSTEM BREAST;  Surgeon: [name], MD;  Location: [place];  Service: General;  Laterality: Left;  ? BREAST LUMPECTOMY Left 08/2015  ?  BREAST LUMPECTOMY WITH AXILLARY LYMPH NODE BIOPSY Left 09/12/2015  ? Procedure: RE-EXCISION OF LEFT BREAST LUMPECTOMY WITH LEFT AXILLARY LYMPH NODE BIOPSY;  Surgeon: [name], MD;  Location: [place];  Service: General;  Laterality: Left;  ? CHOLECYSTECTOMY    ? EYE SURGERY Bilateral 2022  ? Lashes growing backwards into the eye  ? JOINT REPLACEMENT    ? left knee  ? JOINT REPLACEMENT  2017  ? right knee  ? REVERSE SHOULDER ARTHROPLASTY Right 09/07/2018  ? Procedure: RIGHT REVERSE SHOULDER ARTHROPLASTY;   Surgeon: Meredith Pel, MD;  Location: [place];  Service: Orthopedics;  Laterality: Right;  ? TOTAL KNEE ARTHROPLASTY Right 02/28/2015  ? Procedure: RIGHT TOTAL KNEE ARTHROPLASTY;  Surgeon: [name], MD;  [facility];  Service: Orthopedics;  Laterality: Right;  ? TUBAL LIGATION    ?  ? ?Current Meds  ?Medication Sig  ? acetaminophen (TYLENOL) 500 MG tablet Take by mouth.  ? albuterol (VENTOLIN HFA) 108 (90 Base) MCG/ACT inhaler 1 puff as needed  ? anastrozole (ARIMIDEX) 1 MG tablet TAKE 1 TABLET BY MOUTH EVERY DAY  ? Ascorbic Acid (VITAMIN C PO) Take by mouth daily.  ? benzonatate (TESSALON) 100 MG capsule Take 1 capsule (100 mg total) by mouth 2 (two) times daily as needed for cough.  ? buPROPion (WELLBUTRIN XL) 150 MG 24 hr tablet Take 1 tablet (150 mg total) by mouth every morning.  ? citalopram (CELEXA) 40 MG tablet Take 1 tablet (40 mg total) by mouth daily.  ? Continuous Blood Gluc Receiver (DEXCOM G6 RECEIVER) DEVI Use to check blood sugar daily  ? Continuous Blood Gluc Sensor (DEXCOM G6 SENSOR) MISC Change every 10 days  ? Continuous Blood Gluc Transmit (DEXCOM G6 TRANSMITTER) MISC Change every 3 months  ? Cyanocobalamin (VITAMIN B-12 PO) Take by mouth daily.  ? dapagliflozin propanediol (FARXIGA) 10 MG TABS tablet Take 1 tablet (10 mg total) by mouth daily before breakfast.  ? fluticasone (FLONASE) 50 MCG/ACT nasal spray Place 1 spray into both nostrils as needed.   ? gabapentin (NEURONTIN) 600 MG tablet TAKE 1 TABLET IN THE MORNING AND 1 TABLET IN THE AFTERNOON AND 2TABS AT BEDTIME  ? glucose blood (ACCU-CHEK AVIVA PLUS) test strip Use as instructed to check blood sugar 2 times daily  ? insulin aspart (FIASP FLEXTOUCH) 100 UNIT/ML FlexTouch Pen Inject 18 Units into the skin with breakfast, with lunch, and with evening meal.  ? Insulin Disposable Pump (OMNIPOD 5 G6 INTRO, GEN 5,) KIT 1 Device by Does not apply route every 3 (three) days.  ? Insulin Disposable Pump (OMNIPOD 5 G6 POD,  GEN 5,) MISC 1 Device by Does not apply route every 3 (three) days.  ? Insulin Glargine (BASAGLAR KWIKPEN) 100 UNIT/ML INJECT 55 UNITS INTO THE SKIN DAILY  ? Insulin Pen Needle 30G X 5 MM MISC 1 Device by Does not apply route in the morning, at noon, in the evening, and at bedtime.  ? levothyroxine (SYNTHROID) 137 MCG tablet Take 1 tablet (137 mcg total) by mouth daily before breakfast.  ? liraglutide (VICTOZA) 18 MG/3ML SOPN Inject 1.8 mg into the skin daily.  ? losartan (COZAAR) 25 MG tablet Take 1 tablet (25 mg total) by mouth daily.  ? methocarbamol (ROBAXIN) 500 MG tablet Take 1 tablet (500 mg total) by mouth every 8 (eight) hours as needed for muscle spasms.  ? Misc Natural Products (TART CHERRY ADVANCED) CAPS Take 1 capsule by mouth daily.  ? Multiple  Vitamins-Minerals (MULTIVITAMIN WITH MINERALS) tablet Take 1 tablet by mouth daily.  ? mycophenolate (CELLCEPT) 500 MG tablet Take 1,000 mg by mouth 2 (two) times daily.  ? promethazine-dextromethorphan (PROMETHAZINE-DM) 6.25-15 MG/5ML syrup TAKE 5 MLS BY MOUTH 4 TIMES A DAY AS NEEDED FOR COUGH  ? Pyridoxine HCl (VITAMIN B-6 PO) Take by mouth daily.  ? TURMERIC PO Take by mouth daily.  ? VITAMIN D PO Take by mouth daily.  ?  ? ?Allergies:   Codeine, Influenza a (h1n1) monoval pf, Other, Hydrocodone-acetaminophen, Lisinopril, Metformin, Pregabalin, and Jardiance [empagliflozin]  ? ?ROS:   ?Please see the history of present illness.    ? ?All other systems reviewed and are negative. ? ? ?Labs/Other Tests and Data Reviewed:   ? ?Recent Labs: ?05/30/2020: TSH 1.85 ?02/18/2021: Hemoglobin 12.0; Platelets 98  ? ?Recent Lipid Panel ?No results found for: CHOL, TRIG, HDL, CHOLHDL, LDLCALC, LDLDIRECT ? ?Wt Readings from Last 3 Encounters:  ?01/29/21 261 lb (118.4 kg)  ?11/13/20 267 lb (121.1 kg)  ?05/30/20 278 lb 2 oz (126.2 kg)  ?  ? ?ASSESSMENT & PLAN:   ? ?Acute sinusitis ?Started empiric Augmentin as she is on immunosuppressives now ?Advised to use nasal saline spray  as needed ?Flonase for allergies ?Advised to use vaporizer as needed for sinusitis ?Promethazine DM syrup for cough ? ? ?Time:   ?Today, I have spent 9 minutes reviewing the chart, including problem list

## 2021-04-22 ENCOUNTER — Ambulatory Visit: Payer: BC Managed Care – PPO | Admitting: Internal Medicine

## 2021-04-22 ENCOUNTER — Telehealth: Payer: Self-pay

## 2021-04-22 NOTE — Telephone Encounter (Signed)
Patient called said COVID test was negative. ?

## 2021-04-29 DIAGNOSIS — Z20822 Contact with and (suspected) exposure to covid-19: Secondary | ICD-10-CM | POA: Diagnosis not present

## 2021-04-30 DIAGNOSIS — Z9889 Other specified postprocedural states: Secondary | ICD-10-CM | POA: Diagnosis not present

## 2021-04-30 DIAGNOSIS — J3489 Other specified disorders of nose and nasal sinuses: Secondary | ICD-10-CM | POA: Diagnosis not present

## 2021-04-30 DIAGNOSIS — R040 Epistaxis: Secondary | ICD-10-CM | POA: Diagnosis not present

## 2021-04-30 DIAGNOSIS — L539 Erythematous condition, unspecified: Secondary | ICD-10-CM | POA: Diagnosis not present

## 2021-04-30 DIAGNOSIS — J328 Other chronic sinusitis: Secondary | ICD-10-CM | POA: Diagnosis not present

## 2021-04-30 DIAGNOSIS — H1189 Other specified disorders of conjunctiva: Secondary | ICD-10-CM | POA: Diagnosis not present

## 2021-04-30 DIAGNOSIS — J342 Deviated nasal septum: Secondary | ICD-10-CM | POA: Diagnosis not present

## 2021-04-30 DIAGNOSIS — J343 Hypertrophy of nasal turbinates: Secondary | ICD-10-CM | POA: Diagnosis not present

## 2021-04-30 DIAGNOSIS — L129 Pemphigoid, unspecified: Secondary | ICD-10-CM | POA: Diagnosis not present

## 2021-05-06 ENCOUNTER — Other Ambulatory Visit: Payer: Self-pay | Admitting: Internal Medicine

## 2021-05-06 DIAGNOSIS — Z1231 Encounter for screening mammogram for malignant neoplasm of breast: Secondary | ICD-10-CM

## 2021-05-14 ENCOUNTER — Ambulatory Visit: Payer: BC Managed Care – PPO | Admitting: Internal Medicine

## 2021-05-26 DIAGNOSIS — Z79899 Other long term (current) drug therapy: Secondary | ICD-10-CM | POA: Insufficient documentation

## 2021-05-26 DIAGNOSIS — E119 Type 2 diabetes mellitus without complications: Secondary | ICD-10-CM | POA: Diagnosis not present

## 2021-05-26 DIAGNOSIS — H35373 Puckering of macula, bilateral: Secondary | ICD-10-CM | POA: Diagnosis not present

## 2021-05-26 DIAGNOSIS — Z961 Presence of intraocular lens: Secondary | ICD-10-CM | POA: Diagnosis not present

## 2021-05-26 DIAGNOSIS — L121 Cicatricial pemphigoid: Secondary | ICD-10-CM | POA: Diagnosis not present

## 2021-05-26 DIAGNOSIS — H02056 Trichiasis without entropian left eye, unspecified eyelid: Secondary | ICD-10-CM | POA: Diagnosis not present

## 2021-05-26 DIAGNOSIS — Z794 Long term (current) use of insulin: Secondary | ICD-10-CM | POA: Diagnosis not present

## 2021-05-26 DIAGNOSIS — H02053 Trichiasis without entropian right eye, unspecified eyelid: Secondary | ICD-10-CM | POA: Diagnosis not present

## 2021-05-28 DIAGNOSIS — Z79899 Other long term (current) drug therapy: Secondary | ICD-10-CM | POA: Diagnosis not present

## 2021-05-28 DIAGNOSIS — L121 Cicatricial pemphigoid: Secondary | ICD-10-CM | POA: Diagnosis not present

## 2021-05-31 DIAGNOSIS — Z20822 Contact with and (suspected) exposure to covid-19: Secondary | ICD-10-CM | POA: Diagnosis not present

## 2021-06-01 ENCOUNTER — Ambulatory Visit (INDEPENDENT_AMBULATORY_CARE_PROVIDER_SITE_OTHER): Payer: Medicare Other | Admitting: Internal Medicine

## 2021-06-01 ENCOUNTER — Encounter: Payer: Self-pay | Admitting: Internal Medicine

## 2021-06-01 VITALS — BP 138/78 | HR 84 | Resp 18 | Ht 61.0 in | Wt 265.8 lb

## 2021-06-01 DIAGNOSIS — Z20822 Contact with and (suspected) exposure to covid-19: Secondary | ICD-10-CM | POA: Diagnosis not present

## 2021-06-01 DIAGNOSIS — E039 Hypothyroidism, unspecified: Secondary | ICD-10-CM

## 2021-06-01 DIAGNOSIS — R042 Hemoptysis: Secondary | ICD-10-CM

## 2021-06-01 DIAGNOSIS — E1142 Type 2 diabetes mellitus with diabetic polyneuropathy: Secondary | ICD-10-CM | POA: Diagnosis not present

## 2021-06-01 DIAGNOSIS — Z794 Long term (current) use of insulin: Secondary | ICD-10-CM | POA: Diagnosis not present

## 2021-06-01 DIAGNOSIS — L121 Cicatricial pemphigoid: Secondary | ICD-10-CM

## 2021-06-01 DIAGNOSIS — F3340 Major depressive disorder, recurrent, in remission, unspecified: Secondary | ICD-10-CM

## 2021-06-01 DIAGNOSIS — I10 Essential (primary) hypertension: Secondary | ICD-10-CM | POA: Diagnosis not present

## 2021-06-01 NOTE — Assessment & Plan Note (Signed)
On Levothyroxine 137 mcg QD Followed by Endocrinology 

## 2021-06-01 NOTE — Assessment & Plan Note (Signed)
Lab Results  ?Component Value Date  ? HGBA1C 7.8 (A) 11/13/2020  ? ? ?On Basaglar and ISS - followed by Endocrinology, [facility] need to adjust Basaglar due to use of oral steroids currently ?On Victoza and Farxiga ?Advised to follow diabetic diet ?On ARB ?Diabetic eye exam: Advised to follow up with Ophthalmology for diabetic eye exam ?

## 2021-06-01 NOTE — Assessment & Plan Note (Signed)
On mycophenolate and oral steroids Followed by ophthalmology at [facility] 

## 2021-06-01 NOTE — Progress Notes (Signed)
? ?Established Patient Office Visit ? ?Subjective:  ?Patient ID: [name] [name], female    DOB: 21-Oct-1950  Age: 71 y.o. MRN: [id number] ? ?CC:  ?Chief Complaint  ?Patient presents with  ? Follow-up  ?  4 month follow up pt still has cough mainly at night is coughing up blood also diagnosed with pseudophakia in both eyes was put on medication   ? ? ?HPI ?[name] [name] is a 71 y.o. female with past medical history of HTN, type II DM with neuropathy, OSA, hypothyroidism, OA of multiple joints, gout, MDD, breast cancer s/p lumpectomy and morbid obesity who presents for f/u of her chronic medical conditions. ? ?She complains of chronic cough, worse at nighttime and has noticed blood in sputum recently as well.  Denies any smoking history currently.  Denies any dyspnea or wheezing currently.  Denies any fever, chills, weight loss or night sweats. ? ?HTN: Her BP was well controlled in the office today.  She takes Losartan.  She denies any headache, dizziness, chest pain, or palpitations currently. ?  ?Type II DM with HLD: Followed by endocrinology.  She is on Engineer, agricultural and ISS.  She also takes Victoza and [place].  Her last HbA1C was 7.8.  She was planned to get a insulin pump, but has not been able to get it due to insurance coverage concern.  She denies any polyuria or polydipsia currently.  She takes gabapentin for neuropathy, but still has severe burning pain of bilateral feet.  She also has chronic numbness of bilateral feet.  She used to follow-up with neurology, but has not visited them for last few months.  She is on oral steroids for ocular cicatricial pemphigoid and her blood glucose has been running in 200s now.  She has not appointment with her endocrinologist in the next week. ? ?She has history of DNS, for which she sees ENT specialist.  She recently had entropion repair surgery for her eyelid.  She sees ophthalmology and ENT specialist at [facility]. ? ?She has history of left breast cancer, s/p lumpectomy.   She is on anastrozole for ER/PR positive breast cancer and follows up with Oncology. ? ? ? ? ? ? ?Past Medical History:  ?Diagnosis Date  ? Abdominal wall contusion 12/09/2011  ? Achilles tendon contracture, bilateral 06/03/2016  ? Anxiety   ? Arthritis   ? Blood dyscrasia   ? low platelet count- sees [name] , [name] ( Note in EPIC from 07/2014) at [facility]  ? Breast cancer [facility]) 2017  ? Left Breast  ? Cancer ([place])   ? left breast  ? Chest wall contusion 12/09/2011  ? Cirrhosis of liver ([place])   ? Cough   ? Diabetes mellitus without complication ([place])   ? Diabetic neuropathy ([place])   ? Diarrhea due to drug 01/29/2021  ? Gout   ? Gout   ? History of radiation therapy 10/22/15 - 12/08/15  ? left breast 50.4 Gy, boost to 10 Gy  ? Hypertension   ? Migraine   ? Multifactorial gait disorder 01/12/2013  ? Neuromuscular disorder ([place])   ? diabetic neuropathy  ? Neuropathy   ? Personal history of radiation therapy 2017  ? Left Breast Cancer  ? Posterior tibial tendon dysfunction (PTTD) of right lower extremity 10/09/2019  ? Sleep apnea   ? CPAP nightly  ? Spleen enlarged   ? Thyroid disease   ? ? ?Past Surgical History:  ?Procedure Laterality Date  ? ANKLE FUSION Right  2022  ? BREAST BIOPSY Left 08/04/2015  ? Procedure: LEFT BREAST BIOPSY WITH NEEDLE LOCALIZATION;  Surgeon: [name], MD;  Location: [place];  Service: General;  Laterality: Left;  ? BREAST DUCTAL SYSTEM EXCISION Left 08/04/2015  ? Procedure: LEFT EXCISION DUCTAL SYSTEM BREAST;  Surgeon: [name], MD;  Location: [place];  Service: General;  Laterality: Left;  ? BREAST LUMPECTOMY Left 08/2015  ? BREAST LUMPECTOMY WITH AXILLARY LYMPH NODE BIOPSY Left 09/12/2015  ? Procedure: RE-EXCISION OF LEFT BREAST LUMPECTOMY WITH LEFT AXILLARY LYMPH NODE BIOPSY;  Surgeon: [name], MD;  Location: [place];  Service: General;  Laterality: Left;  ? CHOLECYSTECTOMY    ? EYE SURGERY Bilateral 2022  ?  Lashes growing backwards into the eye  ? JOINT REPLACEMENT    ? left knee  ? JOINT REPLACEMENT  2017  ? right knee  ? REVERSE SHOULDER ARTHROPLASTY Right 09/07/2018  ? Procedure: RIGHT REVERSE SHOULDER ARTHROPLASTY;  Surgeon: Meredith Pel, MD;  Location: [place];  Service: Orthopedics;  Laterality: Right;  ? TOTAL KNEE ARTHROPLASTY Right 02/28/2015  ? Procedure: RIGHT TOTAL KNEE ARTHROPLASTY;  Surgeon: [name], MD;  Location: [facility];  Service: Orthopedics;  Laterality: Right;  ? TUBAL LIGATION    ? ? ?Family History  ?Problem Relation Age of Onset  ? Hypothyroidism Mother   ? Alzheimer's disease Mother   ? Diabetes Father   ? Cancer Sister   ?     GYN cancer  ? Hypothyroidism Sister   ? Heart disease Brother   ? Arthritis Son   ? Diabetes Son   ? Arthritis Son   ? Cancer Maternal Grandmother   ? Heart failure Maternal Grandfather   ? Bone cancer Paternal Grandmother   ? Heart failure Paternal Grandmother   ? ? ?Social History  ? ?Socioeconomic History  ? Marital status: Married  ?  Spouse name: edward  ? Number of children: 3  ? Years of education: 12th  ? Highest education level: Not on file  ?Occupational History  ? Occupation: Retired  ?  Comment: [employer]  ?Tobacco Use  ? Smoking status: Never  ? Smokeless tobacco: Never  ?Vaping Use  ? Vaping Use: Never used  ?Substance and Sexual Activity  ? Alcohol use: No  ? Drug use: No  ? Sexual activity: Yes  ?Other Topics Concern  ? Not on file  ?Social History Narrative  ? Not on file  ? ?Social Determinants of Health  ? ?Financial Resource Strain: Low Risk   ? Difficulty of Paying Living Expenses: Not very hard  ?Food Insecurity: No Food Insecurity  ? Worried About Charity fundraiser in the Last Year: Never true  ? Ran Out of Food in the Last Year: Never true  ?Transportation Needs: No Transportation Needs  ? Lack of Transportation (Medical): No  ? Lack of Transportation (Non-Medical): No  ?Physical Activity: Insufficiently Active  ?  Days of Exercise per Week: 3 days  ? Minutes of Exercise per Session: 30 min  ?Stress: No Stress Concern Present  ? Feeling of Stress : Only a little  ?Social Connections: Moderately Integrated  ? Frequency of Communication with Friends and Family: Three times a week  ? Frequency of Social Gatherings with Friends and Family: Never  ? Attends Religious Services: More than 4 times per year  ? Active Member of Clubs or Organizations: No  ? Attends Archivist Meetings: Never  ?  Marital Status: Married  ?Intimate Partner Violence: Not At Risk  ? Fear of Current or Ex-Partner: No  ? Emotionally Abused: No  ? Physically Abused: No  ? Sexually Abused: No  ? ? ?Outpatient Medications Prior to Visit  ?Medication Sig Dispense Refill  ? acetaminophen (TYLENOL) 500 MG tablet Take by mouth.    ? albuterol (VENTOLIN HFA) 108 (90 Base) MCG/ACT inhaler 1 puff as needed    ? anastrozole (ARIMIDEX) 1 MG tablet TAKE 1 TABLET BY MOUTH EVERY DAY 90 tablet 4  ? Ascorbic Acid (VITAMIN C PO) Take by mouth daily.    ? benzonatate (TESSALON) 100 MG capsule Take 1 capsule (100 mg total) by mouth 2 (two) times daily as needed for cough. 20 capsule 0  ? buPROPion (WELLBUTRIN XL) 150 MG 24 hr tablet Take 1 tablet (150 mg total) by mouth every morning. 90 tablet 1  ? citalopram (CELEXA) 40 MG tablet Take 1 tablet (40 mg total) by mouth daily. 90 tablet 1  ? Continuous Blood Gluc Receiver (DEXCOM G6 RECEIVER) DEVI Use to check blood sugar daily 1 each 0  ? Continuous Blood Gluc Sensor (DEXCOM G6 SENSOR) MISC Change every 10 days 9 each 3  ? Continuous Blood Gluc Transmit (DEXCOM G6 TRANSMITTER) MISC Change every 3 months 1 each 3  ? Cyanocobalamin (VITAMIN B-12 PO) Take by mouth daily.    ? dapagliflozin propanediol (FARXIGA) 10 MG TABS tablet Take 1 tablet (10 mg total) by mouth daily before breakfast. 90 tablet 3  ? fluticasone (FLONASE) 50 MCG/ACT nasal spray Place 1 spray into both nostrils as needed.     ? gabapentin (NEURONTIN) 600  MG tablet TAKE 1 TABLET IN THE MORNING AND 1 TABLET IN THE AFTERNOON AND 2TABS AT BEDTIME    ? glucose blood (ACCU-CHEK AVIVA PLUS) test strip Use as instructed to check blood sugar 2 times daily 100 eac

## 2021-06-01 NOTE — Patient Instructions (Signed)
Please continue taking medications as prescribed. ? ?Please continue to follow low carb diet and ambulate as tolerated. ? ?You are being scheduled to get CT chest. ?

## 2021-06-01 NOTE — Assessment & Plan Note (Signed)
Well-controlled with Celexa and Wellbutrin ?

## 2021-06-01 NOTE — Assessment & Plan Note (Signed)
BP Readings from Last 1 Encounters:  ?06/01/21 138/78  ? ?Well-controlled with Losartan ?Counseled for compliance with the medications ?Advised DASH diet and moderate exercise/walking as tolerated ?

## 2021-06-01 NOTE — Assessment & Plan Note (Signed)
Unclear etiology ?Could be due to dry air exposure, but since she has chronic cough and hemoptysis, will get CT chest ?Advised to use humidifier at nighttime for now ?Tessalon as needed for cough ?

## 2021-06-01 NOTE — Assessment & Plan Note (Signed)
Diet modification and ambulation as tolerated ?On Victoza for type 2 DM ?

## 2021-06-02 ENCOUNTER — Telehealth: Payer: Self-pay | Admitting: Internal Medicine

## 2021-06-02 NOTE — Telephone Encounter (Signed)
Patient called in for test results  ?

## 2021-06-02 NOTE — Telephone Encounter (Signed)
Pt labs have not resulted will call when provider results  ?

## 2021-06-03 LAB — MICROALBUMIN / CREATININE URINE RATIO
Creatinine, Urine: 60.2 mg/dL
Microalb/Creat Ratio: 251 mg/g creat — ABNORMAL HIGH (ref 0–29)
Microalbumin, Urine: 150.9 ug/mL

## 2021-06-09 ENCOUNTER — Encounter: Payer: Self-pay | Admitting: Internal Medicine

## 2021-06-09 ENCOUNTER — Ambulatory Visit (INDEPENDENT_AMBULATORY_CARE_PROVIDER_SITE_OTHER): Payer: Medicare Other | Admitting: Internal Medicine

## 2021-06-09 ENCOUNTER — Telehealth: Payer: Self-pay | Admitting: Internal Medicine

## 2021-06-09 ENCOUNTER — Other Ambulatory Visit (HOSPITAL_COMMUNITY): Payer: Self-pay

## 2021-06-09 VITALS — BP 130/80 | HR 68 | Ht 61.0 in | Wt 262.0 lb

## 2021-06-09 DIAGNOSIS — R739 Hyperglycemia, unspecified: Secondary | ICD-10-CM

## 2021-06-09 DIAGNOSIS — Z794 Long term (current) use of insulin: Secondary | ICD-10-CM

## 2021-06-09 DIAGNOSIS — E1142 Type 2 diabetes mellitus with diabetic polyneuropathy: Secondary | ICD-10-CM | POA: Diagnosis not present

## 2021-06-09 DIAGNOSIS — E1165 Type 2 diabetes mellitus with hyperglycemia: Secondary | ICD-10-CM | POA: Diagnosis not present

## 2021-06-09 DIAGNOSIS — E039 Hypothyroidism, unspecified: Secondary | ICD-10-CM

## 2021-06-09 LAB — LIPID PANEL
Cholesterol: 171 mg/dL (ref 0–200)
HDL: 63.3 mg/dL (ref >39.00)
LDL Cholesterol: 80 mg/dL (ref 0–99)
NonHDL: 107.33
Total CHOL/HDL Ratio: 3
Triglycerides: 137 mg/dL (ref 0.0–149.0)
VLDL: 27.4 mg/dL (ref 0.0–40.0)

## 2021-06-09 LAB — POCT GLUCOSE (DEVICE FOR HOME USE): Glucose Fasting, POC: 142 mg/dL — AB (ref 70–99)

## 2021-06-09 LAB — POCT GLYCOSYLATED HEMOGLOBIN (HGB A1C): Hemoglobin A1C: 8.2 % — AB (ref 4.0–5.6)

## 2021-06-09 LAB — TSH: TSH: 17.7 u[IU]/mL — ABNORMAL HIGH (ref 0.35–5.50)

## 2021-06-09 MED ORDER — LEVOTHYROXINE SODIUM 150 MCG PO TABS: 150.0000 ug | ORAL_TABLET | Freq: Every day | ORAL | 3 refills | Status: DC

## 2021-06-09 MED ORDER — BASAGLAR KWIKPEN 100 UNIT/ML ~~LOC~~ SOPN: 60.0000 [IU] | PEN_INJECTOR | Freq: Every day | SUBCUTANEOUS | 3 refills | Status: DC

## 2021-06-09 NOTE — Telephone Encounter (Signed)
Can you please proceed with PA on this patients's OMNIPOD prescription. I sent a prescription last October but she did not get it yet ? ? ? ?Thanks  ?

## 2021-06-09 NOTE — Progress Notes (Signed)
? ?Name: [name] [name]  ?Age/ Sex: 71 y.o., female   ?MRN/ DOB: [id number], 03/02/1950    ? ?PCP: [name], MD   ?Reason for Endocrinology Evaluation: Type 2 Diabetes Mellitus  ?Initial Endocrine Consultative Visit: 03/20/2018  ? ? ?PATIENT IDENTIFIER: [name] [name] is a 71 y.o. female with a past medical history of T2DM, hypothyroidism, HTN and Hx of breast Ca . The patient has followed with Endocrinology clinic since 03/20/2018 for consultative assistance with management of her diabetes. ? ?DIABETIC HISTORY:  ?[name] [name] was diagnosed with T2DM many years ago, she is intolerant to Metformin. She was started on Jardiance January, 2020 but due to dizziness this was stopped in February, 2020.She was continued on Victoza and Lantus at the time.  Her hemoglobin A1c has ranged from 6.6% in 2017, peaking at 8.5%  In 2020 ?Wilder Glade started 08/2018 ? ? ?Prandial insulin started 02/2020 ? ?Hypothyroidism: ?She was diagnosed with hypothyroidism many years ago. ?She has been on LT-4 replacement since her diagnosis. She takes it appropriately  ? ?SUBJECTIVE:  ? ?During the last visit (11/13/2020): A1c 7.8 %. Adjusted MDI regimen,continued   [place]  and Victoza  ? ? ? ? ?Today (06/09/2021): [name] [name] is here for a  follow up on diabetes management.   She checks her blood sugars occasionally. The patient has not had hypoglycemic episodes since the last clinic visit.  ?  ? ? ?She has been following up with ophthalmology for ocular cicatricial pemphigoid , on cellcept  ?She is also S/P BLL entropion repair 10/2020 ?She has also been on steroids  ?Has an ED visit for epistaxis , pending CT scan  ?Has chronic cough with clear mucous ? ?Denies nausea or vomiting or diarrhea  ? ?She has not been able to get the Omnipod  ? ?Takes levothyroxine as prescribe ? ?HOME DIABETES REGIMEN:  ?Victoza 1.8 mg daily ?Basaglar  55  units daily ?Farxiga 10 mg daily ?Fiasp 18 units with each meal  ?Levothyroxine 137 mcg daily  ? ? ?METER DOWNLOAD  SUMMARY: n/a ? ? ?DIABETIC COMPLICATIONS: ?Microvascular complications:  ?Neuropathy ?Denies: CKD, retinopathy ?Last eye exam: Completed 05/26/2021 ?  ?Macrovascular complications:  ?Denies: CAD, PVD, CVA ?  ? ? ? ?HISTORY:  ?Past Medical History:  ?Past Medical History:  ?Diagnosis Date  ? Abdominal wall contusion 12/09/2011  ? Achilles tendon contracture, bilateral 06/03/2016  ? Anxiety   ? Arthritis   ? Blood dyscrasia   ? low platelet count- sees [name] , [name] ( Note in EPIC from 07/2014) at [facility]  ? Breast cancer [facility]) 2017  ? Left Breast  ? Cancer ([place])   ? left breast  ? Chest wall contusion 12/09/2011  ? Cirrhosis of liver ([place])   ? Cough   ? Diabetes mellitus without complication ([place])   ? Diabetic neuropathy ([place])   ? Diarrhea due to drug 01/29/2021  ? Gout   ? Gout   ? History of radiation therapy 10/22/15 - 12/08/15  ? left breast 50.4 Gy, boost to 10 Gy  ? Hypertension   ? Migraine   ? Multifactorial gait disorder 01/12/2013  ? Neuromuscular disorder ([place])   ? diabetic neuropathy  ? Neuropathy   ? Personal history of radiation therapy 2017  ? Left Breast Cancer  ? Posterior tibial tendon dysfunction (PTTD) of right lower extremity 10/09/2019  ? Sleep apnea   ? CPAP nightly  ? Spleen enlarged   ? Thyroid disease   ? ?  Past Surgical History:  ?Past Surgical History:  ?Procedure Laterality Date  ? ANKLE FUSION Right 2022  ? BREAST BIOPSY Left 08/04/2015  ? Procedure: LEFT BREAST BIOPSY WITH NEEDLE LOCALIZATION;  Surgeon: [name], MD;  Location: [place];  Service: General;  Laterality: Left;  ? BREAST DUCTAL SYSTEM EXCISION Left 08/04/2015  ? Procedure: LEFT EXCISION DUCTAL SYSTEM BREAST;  Surgeon: [name], MD;  Location: [place];  Service: General;  Laterality: Left;  ? BREAST LUMPECTOMY Left 08/2015  ? BREAST LUMPECTOMY WITH AXILLARY LYMPH NODE BIOPSY Left 09/12/2015  ? Procedure: RE-EXCISION OF LEFT BREAST LUMPECTOMY WITH LEFT AXILLARY  LYMPH NODE BIOPSY;  Surgeon: [name], MD;  Location: [place];  Service: General;  Laterality: Left;  ? CHOLECYSTECTOMY    ? EYE SURGERY Bilateral 2022  ? Lashes growing backwards into the eye  ? JOINT REPLACEMENT    ? left knee  ? JOINT REPLACEMENT  2017  ? right knee  ? REVERSE SHOULDER ARTHROPLASTY Right 09/07/2018  ? Procedure: RIGHT REVERSE SHOULDER ARTHROPLASTY;  Surgeon: Meredith Pel, MD;  Location: [place];  Service: Orthopedics;  Laterality: Right;  ? TOTAL KNEE ARTHROPLASTY Right 02/28/2015  ? Procedure: RIGHT TOTAL KNEE ARTHROPLASTY;  Surgeon: [name], MD;  Location[facility];  Service: Orthopedics;  Laterality: Right;  ? TUBAL LIGATION    ? ?Social History:  reports that she has never smoked. She has never used smokeless tobacco. She reports that she does not drink alcohol and does not use drugs. ?Family History:  ?Family History  ?Problem Relation Age of Onset  ? Hypothyroidism Mother   ? Alzheimer's disease Mother   ? Diabetes Father   ? Cancer Sister   ?     GYN cancer  ? Hypothyroidism Sister   ? Heart disease Brother   ? Arthritis Son   ? Diabetes Son   ? Arthritis Son   ? Cancer Maternal Grandmother   ? Heart failure Maternal Grandfather   ? Bone cancer Paternal Grandmother   ? Heart failure Paternal Grandmother   ? ? ? ?HOME MEDICATIONS: ?Allergies as of 06/09/2021   ? ?   Reactions  ? Codeine Swelling  ? Influenza A (h1n1) [place] Pf Anaphylaxis  ? Other Anaphylaxis  ? Does not know what this allergy agent could be; but denied she had any issues with Latex  ? Hydrocodone-acetaminophen Other (See Comments)  ? Lisinopril Other (See Comments)  ? Metformin Other (See Comments)  ? Pregabalin Other (See Comments)  ? Jardiance [empagliflozin] Other (See Comments)  ? dehydration  ? ?  ? ?  ?Medication List  ?  ? ?  ? Accurate as of [place] 9, 2023  4:36 PM. If you have any questions, ask your nurse or doctor.  ?  ?  ? ?  ? ?Accu-Chek Aviva Plus test strip ?Generic drug:  glucose blood ?Use as instructed to check blood sugar 2 times daily ?  ?acetaminophen 500 MG tablet ?Commonly known as: TYLENOL ?Take by mouth. ?  ?albuterol 108 (90 Base) MCG/ACT inhaler ?Commonly known as: VENTOLIN HFA ?1 puff as needed ?  ?anastrozole 1 MG tablet ?Commonly known as: ARIMIDEX ?TAKE 1 TABLET BY MOUTH EVERY DAY ?  ?Basaglar KwikPen 100 UNIT/ML ?Inject 60 Units into the skin daily. ?What changed: how much to take ?Changed by: Dorita Sciara, MD ?  ?benzonatate 100 MG capsule ?Commonly known as: TESSALON ?Take 1 capsule (100 mg total) by mouth 2 (  two) times daily as needed for cough. ?  ?buPROPion 150 MG 24 hr tablet ?Commonly known as: WELLBUTRIN XL ?Take 1 tablet (150 mg total) by mouth every morning. ?  ?citalopram 40 MG tablet ?Commonly known as: CELEXA ?Take 1 tablet (40 mg total) by mouth daily. ?  ?dapagliflozin propanediol 10 MG Tabs tablet ?Commonly known as: [place] ?Take 1 tablet (10 mg total) by mouth daily before breakfast. ?  ?Dexcom G6 Receiver Kerrin Mo ?Use to check blood sugar daily ?  ?Dexcom G6 Sensor Misc ?Change every 10 days ?  ?Dexcom G6 Transmitter Misc ?Change every 3 months ?  ?Fiasp FlexTouch 100 UNIT/ML FlexTouch Pen ?Generic drug: insulin aspart ?Inject 18 Units into the skin with breakfast, with lunch, and with evening meal. ?  ?fluticasone 50 MCG/ACT nasal spray ?Commonly known as: FLONASE ?Place 1 spray into both nostrils as needed. ?  ?gabapentin 600 MG tablet ?Commonly known as: NEURONTIN ?TAKE 1 TABLET IN THE MORNING AND 1 TABLET IN THE AFTERNOON AND 2TABS AT BEDTIME ?  ?Insulin Pen Needle 30G X 5 MM Misc ?1 Device by Does not apply route in the morning, at noon, in the evening, and at bedtime. ?  ?levothyroxine 150 MCG tablet ?Commonly known as: SYNTHROID ?Take 1 tablet (150 mcg total) by mouth daily. ?What changed:  ?medication strength ?how much to take ?when to take this ?Changed by: Dorita Sciara, MD ?  ?liraglutide 18 MG/3ML Sopn ?Commonly known as:  VICTOZA ?Inject 1.8 mg into the skin daily. ?  ?losartan 25 MG tablet ?Commonly known as: COZAAR ?Take 1 tablet (25 mg total) by mouth daily. ?  ?methocarbamol 500 MG tablet ?Commonly known as: ROBAXIN ?Take 1 t

## 2021-06-09 NOTE — Patient Instructions (Signed)
-   Increase Basaglar 60 units daily  ?- Continue Victoza 1.8 mg daily  ?- Continue Farxiga 10  mg daily  ?- Continue  Fiasp 18 units with each meal  ? ? ?HOW TO TREAT LOW BLOOD SUGARS (Blood sugar LESS THAN 70 MG/DL) ?Please follow the RULE OF 15 for the treatment of hypoglycemia treatment (when your (blood sugars are less than 70 mg/dL)  ? ?STEP 1: Take 15 grams of carbohydrates when your blood sugar is low, which includes:  ?3-4 GLUCOSE TABS  OR ?3-4 OZ OF JUICE OR REGULAR SODA OR ?ONE TUBE OF GLUCOSE GEL   ? ?STEP 2: RECHECK blood sugar in 15 MINUTES ?STEP 3: If your blood sugar is still low at the 15 minute recheck --> then, go back to STEP 1 and treat AGAIN with another 15 grams of carbohydrates. ? ?

## 2021-06-09 NOTE — Telephone Encounter (Signed)
PA is not needed. Test billing results come back the Omnipod Kit is $141, and the Omnipod Pods are #10 for $47.

## 2021-06-10 ENCOUNTER — Ambulatory Visit
Admission: RE | Admit: 2021-06-10 | Discharge: 2021-06-10 | Disposition: A | Discharge status: Home / Self Care | Payer: Medicare Other | Source: Ambulatory Visit | Attending: Internal Medicine | Admitting: Internal Medicine

## 2021-06-10 DIAGNOSIS — Z1231 Encounter for screening mammogram for malignant neoplasm of breast: Secondary | ICD-10-CM

## 2021-06-11 ENCOUNTER — Other Ambulatory Visit: Payer: Self-pay | Admitting: Internal Medicine

## 2021-06-11 DIAGNOSIS — R928 Other abnormal and inconclusive findings on diagnostic imaging of breast: Secondary | ICD-10-CM

## 2021-06-17 ENCOUNTER — Other Ambulatory Visit: Payer: Self-pay | Admitting: Internal Medicine

## 2021-06-19 ENCOUNTER — Other Ambulatory Visit: Payer: Self-pay | Admitting: Internal Medicine

## 2021-06-20 ENCOUNTER — Ambulatory Visit
Admission: RE | Admit: 2021-06-20 | Discharge: 2021-06-20 | Disposition: A | Discharge status: Home / Self Care | Payer: Medicare Other | Source: Ambulatory Visit | Attending: Internal Medicine | Admitting: Internal Medicine

## 2021-06-20 ENCOUNTER — Other Ambulatory Visit: Payer: Self-pay | Admitting: Internal Medicine

## 2021-06-20 DIAGNOSIS — N6311 Unspecified lump in the right breast, upper outer quadrant: Secondary | ICD-10-CM | POA: Diagnosis not present

## 2021-06-20 DIAGNOSIS — N6310 Unspecified lump in the right breast, unspecified quadrant: Secondary | ICD-10-CM

## 2021-06-20 DIAGNOSIS — R928 Other abnormal and inconclusive findings on diagnostic imaging of breast: Secondary | ICD-10-CM

## 2021-06-22 ENCOUNTER — Encounter: Payer: Self-pay | Admitting: Hematology and Oncology

## 2021-06-22 ENCOUNTER — Inpatient Hospital Stay: Payer: Medicare Other | Attending: Hematology and Oncology | Admitting: Hematology and Oncology

## 2021-06-22 ENCOUNTER — Other Ambulatory Visit: Payer: Self-pay

## 2021-06-22 ENCOUNTER — Ambulatory Visit (HOSPITAL_COMMUNITY)
Admission: RE | Admit: 2021-06-22 | Discharge: 2021-06-22 | Disposition: A | Discharge status: Home / Self Care | Payer: Medicare Other | Source: Ambulatory Visit | Attending: Internal Medicine | Admitting: Internal Medicine

## 2021-06-22 VITALS — BP 146/69 | HR 80 | Temp 97.3°F | Resp 16 | Wt 266.9 lb

## 2021-06-22 DIAGNOSIS — Z807 Family history of other malignant neoplasms of lymphoid, hematopoietic and related tissues: Secondary | ICD-10-CM | POA: Diagnosis not present

## 2021-06-22 DIAGNOSIS — I10 Essential (primary) hypertension: Secondary | ICD-10-CM | POA: Diagnosis not present

## 2021-06-22 DIAGNOSIS — M8588 Other specified disorders of bone density and structure, other site: Secondary | ICD-10-CM

## 2021-06-22 DIAGNOSIS — R042 Hemoptysis: Secondary | ICD-10-CM | POA: Insufficient documentation

## 2021-06-22 DIAGNOSIS — Z853 Personal history of malignant neoplasm of breast: Secondary | ICD-10-CM | POA: Insufficient documentation

## 2021-06-22 DIAGNOSIS — Z170 Estrogen receptor positive status [ER+]: Secondary | ICD-10-CM

## 2021-06-22 DIAGNOSIS — Z808 Family history of malignant neoplasm of other organs or systems: Secondary | ICD-10-CM | POA: Insufficient documentation

## 2021-06-22 DIAGNOSIS — Z90710 Acquired absence of both cervix and uterus: Secondary | ICD-10-CM | POA: Insufficient documentation

## 2021-06-22 DIAGNOSIS — Z13820 Encounter for screening for osteoporosis: Secondary | ICD-10-CM

## 2021-06-22 DIAGNOSIS — J9811 Atelectasis: Secondary | ICD-10-CM | POA: Diagnosis not present

## 2021-06-22 DIAGNOSIS — C50412 Malignant neoplasm of upper-outer quadrant of left female breast: Secondary | ICD-10-CM

## 2021-06-22 DIAGNOSIS — E1140 Type 2 diabetes mellitus with diabetic neuropathy, unspecified: Secondary | ICD-10-CM | POA: Insufficient documentation
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
## 2021-06-23 ENCOUNTER — Ambulatory Visit
Admission: RE | Admit: 2021-06-23 | Discharge: 2021-06-23 | Disposition: A | Discharge status: Home / Self Care | Payer: Medicare Other | Source: Ambulatory Visit | Attending: Internal Medicine | Admitting: Internal Medicine

## 2021-06-23 DIAGNOSIS — N6311 Unspecified lump in the right breast, upper outer quadrant: Secondary | ICD-10-CM | POA: Diagnosis not present

## 2021-06-23 DIAGNOSIS — N6310 Unspecified lump in the right breast, unspecified quadrant: Secondary | ICD-10-CM

## 2021-06-23 DIAGNOSIS — C50411 Malignant neoplasm of upper-outer quadrant of right female breast: Secondary | ICD-10-CM | POA: Diagnosis not present

## 2021-06-23 HISTORY — PX: BREAST BIOPSY: SHX20

## 2021-06-24 ENCOUNTER — Ambulatory Visit (INDEPENDENT_AMBULATORY_CARE_PROVIDER_SITE_OTHER): Payer: Medicare Other

## 2021-06-24 ENCOUNTER — Encounter: Payer: Self-pay | Admitting: Physician Assistant

## 2021-06-24 ENCOUNTER — Ambulatory Visit (INDEPENDENT_AMBULATORY_CARE_PROVIDER_SITE_OTHER): Payer: Medicare Other | Admitting: Physician Assistant

## 2021-06-24 ENCOUNTER — Ambulatory Visit: Payer: Self-pay

## 2021-06-24 ENCOUNTER — Telehealth: Payer: Self-pay | Admitting: Hematology and Oncology

## 2021-06-24 DIAGNOSIS — S92511A Displaced fracture of proximal phalanx of right lesser toe(s), initial encounter for closed fracture: Secondary | ICD-10-CM | POA: Diagnosis not present

## 2021-06-24 DIAGNOSIS — M79671 Pain in right foot: Secondary | ICD-10-CM | POA: Diagnosis not present

## 2021-06-24 DIAGNOSIS — S92411A Displaced fracture of proximal phalanx of right great toe, initial encounter for closed fracture: Secondary | ICD-10-CM | POA: Diagnosis not present

## 2021-06-24 DIAGNOSIS — M25511 Pain in right shoulder: Secondary | ICD-10-CM

## 2021-06-24 DIAGNOSIS — M25572 Pain in left ankle and joints of left foot: Secondary | ICD-10-CM | POA: Diagnosis not present

## 2021-06-24 DIAGNOSIS — M79672 Pain in left foot: Secondary | ICD-10-CM | POA: Diagnosis not present

## 2021-06-24 MED ORDER — ACETAMINOPHEN-CODEINE 300-30 MG PO TABS: 1.0000 | ORAL_TABLET | Freq: Four times a day (QID) | ORAL | 0 refills | Status: DC | PRN

## 2021-06-24 NOTE — Telephone Encounter (Signed)
Scheduled appointment per 05/23 los. Patient aware.

## 2021-06-24 NOTE — Progress Notes (Signed)
HPI: [name] [name] comes in today due to the fact that she fell going up some stairs yesterday and injured both feet and fell onto an outstretched right arm.  She has bruising in both feet.  Has pain with range of motion.  She has a history of right shoulder replacement done by Dr. Marlou Sa.  She had no loss of consciousness.  Unfortunately she is found out recently that she does have breast cancer and is going to be treated for this in the near future.  Review of systems see HPI otherwise negative.  Physical exam: General well-developed well-nourished female in no acute distress seated in wheelchair.  Right shoulder she has good range of motion of the shoulder with some discomfort.  Shoulder moves fluidly with passive range of motion.  She has tenderness in the biceps muscle belly on the right but no ecchymosis and no evidence of distalization of the right biceps.  Bilateral feet: No rashes skin lesions.  Significant ecchymosis involving dorsal aspect over the great and second toe both feet.  She has a blister over the right great toe.  No gross deformities of either foot.  Radiographs:Right foot 3 views: Status post ankle fusion appears well-healed.  First proximal phalanx fracture involving the proximal portion of the phalanx with minimal displacement but intra-articular involvement.  Comminuted fracture second proximal phalanx base with intra-articular involvement.  No other fractures identified no other acute findings  Left foot 3 views: None this placed fracture involving the first proximal phalanx.  No obvious intra-articular involvement.  No other fractures  identified throughout the foot.  Right shoulder multiple views.  Images are slightly underpenetrated.  Status post right shoulder arthroplasty.  Shoulder appears overall well located.  No hardware failure or loosening.  Impression Left foot base of first phalanx fracture Right first phalanx base fracture and second phalanx fracture Right  shoulder pain  Plan: We will place her in postop shoes both feet to wear when ambulating weightbearing as tolerated try to place most of her weight through her heel but cam boot full weightbearing for balance.  Regards to the shoulder conservative treatment with rest and ice.  Recommend elevation bilateral feet.  She is given Tylenol 3 for pain.  We will see her back in 3 to 4 weeks with repeat radiographs of both feet.  Questions encouraged and answered.

## 2021-06-25 ENCOUNTER — Other Ambulatory Visit: Payer: Self-pay | Admitting: Internal Medicine

## 2021-06-26 ENCOUNTER — Telehealth: Payer: Self-pay

## 2021-06-26 DIAGNOSIS — E1165 Type 2 diabetes mellitus with hyperglycemia: Secondary | ICD-10-CM

## 2021-06-26 MED ORDER — BD PEN NEEDLE MINI U/F 31G X 5 MM MISC: 3 refills | Status: DC

## 2021-06-26 NOTE — Telephone Encounter (Signed)
Patient left 2 voicemails needing pen needles. Pen needles were sent back on 5/17 but transaction failed. Resent today. Tried calling patient but no answer and vm full.

## 2021-06-30 DIAGNOSIS — L129 Pemphigoid, unspecified: Secondary | ICD-10-CM | POA: Diagnosis not present

## 2021-06-30 DIAGNOSIS — J342 Deviated nasal septum: Secondary | ICD-10-CM | POA: Diagnosis not present

## 2021-06-30 DIAGNOSIS — J328 Other chronic sinusitis: Secondary | ICD-10-CM | POA: Diagnosis not present

## 2021-06-30 DIAGNOSIS — L121 Cicatricial pemphigoid: Secondary | ICD-10-CM | POA: Diagnosis not present

## 2021-06-30 DIAGNOSIS — J343 Hypertrophy of nasal turbinates: Secondary | ICD-10-CM | POA: Diagnosis not present

## 2021-06-30 DIAGNOSIS — J3489 Other specified disorders of nose and nasal sinuses: Secondary | ICD-10-CM | POA: Diagnosis not present

## 2021-06-30 DIAGNOSIS — R040 Epistaxis: Secondary | ICD-10-CM | POA: Diagnosis not present

## 2021-07-07 ENCOUNTER — Other Ambulatory Visit: Payer: Self-pay | Admitting: Surgery

## 2021-07-07 DIAGNOSIS — C50911 Malignant neoplasm of unspecified site of right female breast: Secondary | ICD-10-CM | POA: Diagnosis not present

## 2021-07-07 DIAGNOSIS — Z853 Personal history of malignant neoplasm of breast: Secondary | ICD-10-CM

## 2021-07-08 ENCOUNTER — Inpatient Hospital Stay: Payer: Medicare Other | Attending: Hematology and Oncology | Admitting: Hematology and Oncology

## 2021-07-08 ENCOUNTER — Encounter: Payer: Self-pay | Admitting: Hematology and Oncology

## 2021-07-08 ENCOUNTER — Other Ambulatory Visit: Payer: Self-pay

## 2021-07-08 ENCOUNTER — Inpatient Hospital Stay: Payer: Medicare Other

## 2021-07-08 VITALS — BP 148/66 | HR 72 | Temp 97.9°F | Resp 18 | Ht 61.0 in

## 2021-07-08 DIAGNOSIS — C50412 Malignant neoplasm of upper-outer quadrant of left female breast: Secondary | ICD-10-CM | POA: Insufficient documentation

## 2021-07-08 DIAGNOSIS — Z170 Estrogen receptor positive status [ER+]: Secondary | ICD-10-CM

## 2021-07-08 DIAGNOSIS — Z5111 Encounter for antineoplastic chemotherapy: Secondary | ICD-10-CM | POA: Diagnosis not present

## 2021-07-08 DIAGNOSIS — C50911 Malignant neoplasm of unspecified site of right female breast: Secondary | ICD-10-CM

## 2021-07-08 MED ORDER — FULVESTRANT 250 MG/5ML IM SOSY
500.0000 mg | PREFILLED_SYRINGE | Freq: Once | INTRAMUSCULAR | Status: AC
  Administered 2021-07-08: 500 mg via INTRAMUSCULAR
  Filled 2021-07-08: qty 10

## 2021-07-08 NOTE — Patient Instructions (Signed)
Fulvestrant injection What is this medication? FULVESTRANT (ful VES trant) blocks the effects of estrogen. It is used to treat breast cancer. This medicine Sauseda be used for other purposes; ask your health care provider or pharmacist if you have questions. COMMON BRAND NAME(S): FASLODEX What should I tell my care team before I take this medication? They need to know if you have any of these conditions: bleeding disorders liver disease low blood counts, like low white cell, platelet, or red cell counts an unusual or allergic reaction to fulvestrant, other medicines, foods, dyes, or preservatives pregnant or trying to get pregnant breast-feeding How should I use this medication? This medicine is for injection into a muscle. It is usually given by a health care professional in a hospital or clinic setting. Talk to your pediatrician regarding the use of this medicine in children. Special care Fazzino be needed. Overdosage: If you think you have taken too much of this medicine contact a poison control center or emergency room at once. NOTE: This medicine is only for you. Do not share this medicine with others. What if I miss a dose? It is important not to miss your dose. Call your doctor or health care professional if you are unable to keep an appointment. What Aikens interact with this medication? medicines that treat or prevent blood clots like warfarin, enoxaparin, dalteparin, apixaban, dabigatran, and rivaroxaban This list Wal[place] not describe all possible interactions. Give your health care provider a list of all the medicines, herbs, non-prescription drugs, or dietary supplements you use. Also tell them if you smoke, drink alcohol, or use illegal drugs. Some items Lux interact with your medicine. What should I watch for while using this medication? Your condition will be monitored carefully while you are receiving this medicine. You will need important blood work done while you are taking this  medicine. Do not become pregnant while taking this medicine or for at least 1 year after stopping it. Women of child-bearing potential will need to have a negative pregnancy test before starting this medicine. Women should inform their doctor if they wish to become pregnant or think they might be pregnant. There is a potential for serious side effects to an unborn child. Men should inform their doctors if they wish to father a child. This medicine Hussein lower sperm counts. Talk to your health care professional or pharmacist for more information. Do not breast-feed an infant while taking this medicine or for 1 year after the last dose. What side effects Schley I notice from receiving this medication? Side effects that you should report to your doctor or health care professional as soon as possible: allergic reactions like skin rash, itching or hives, swelling of the face, lips, or tongue feeling faint or lightheaded, falls pain, tingling, numbness, or weakness in the legs signs and symptoms of infection like fever or chills; cough; flu-like symptoms; sore throat vaginal bleeding Side effects that usually do not require medical attention (report to your doctor or health care professional if they continue or are bothersome): aches, pains constipation diarrhea headache hot flashes nausea, vomiting pain at site where injected stomach pain This list Cordy not describe all possible side effects. Call your doctor for medical advice about side effects. You Cage report side effects to FDA at 1-800-FDA-1088. Where should I keep my medication? This drug is given in a hospital or clinic and will not be stored at home. NOTE: This sheet is a summary. It [place] not cover all possible information. If you have   questions about this medicine, talk to your doctor, pharmacist, or health care provider.  2023 [employer]/Gold Standard (2017-05-03 00:00:00)  

## 2021-07-08 NOTE — Progress Notes (Signed)
[facility]  [phone] Fax:([phone]     ID: [name] [name] DOB: [birth year]  MR#: [id number]  ZDG#:[id number]  Patient Care Team: [name] Spar, MD as PCP - General (Internal Medicine) OTHER MD:  CHIEF COMPLAINT: Estrogen receptor positive invasive breast cancer  CURRENT TREATMENT: anastrozole   COVID 19 VACCINATION STATUS: Not vaccinated as of 12/22/2019   BREAST CANCER HISTORY:  Oncology History  Malignant neoplasm of upper-outer quadrant of left breast in female, estrogen receptor positive ([name] [name])  08/19/2015 Initial Diagnosis   Malignant neoplasm of upper-outer quadrant of left breast in female, estrogen receptor positive ([name] [name])    Mammogram   71 y.o. [name] [name] woman status post left breast upper outer quadrant lumpectomy 08/04/2015 for a pT1c pNX, stage I A invasive ductal carcinoma, estrogen and progesterone receptor positive, HER-2 not amplified, with no HER-2 amplification, anterior margin was focally positive, additional Left breast surgery 09/12/2015 cleared the margins and found all 5 axillary lymph nodes sampled clear for a final stage pT1c pN0, stage IA  Oncotype DX score of 12 predicts a 10 year risk of recurrence outside the breast of 8% if the patient's only systemic therapy is tamoxifen for 5 years. It also predicts no benefit from therapy. She had adjuvant radiation 10/22/15 - 12/08/15              1) Left breast: 50.4 Gy in 28 fractions              2) Left breast boost: 10 Gy in 5 fractions Started adjuvant anastrozole 02/02/2015    06/10/2021 Mammogram   Mammogram showed suspicious mass in 10:30 o'clock location of the RIGHT breast 9 centimeters from the nipple correlating with distortion seen mammographically. [place] right axilla negative. Pathology showed IDC, ER 90% positive, strong staining, PR 60% positive, moderate staining. Her 2 negative. Ki 2%. She was taking anastrazole when this abnormality was detected.     Interval  History  She is here after the biopsy. She is still doing the MMF for the eyes. She is continuing to struggle with hyperglycemia and poorly controlled DM. She saw Dr [name] [name] yesterday, wants to proceed with breast conserving surgery.  PAST MEDICAL HISTORY: Past Medical History:  Diagnosis Date   Abdominal wall contusion 12/09/2011   Achilles tendon contracture, bilateral 06/03/2016   Anxiety    Arthritis    Blood dyscrasia    low platelet count- sees [name] , [name] ( Note in [name] [name] from 07/2014) at [name]) 2017   Left Breast   [facility]cer [facility])    left breast   Chest wall contusion 12/09/2011   Cirrhosis of liver ([name] [name])    Cough    Diabetes mellitus without complication ([name] [name])    Diabetic neuropathy ([name] [name])    Diarrhea due to drug 01/29/2021   Gout    Gout    History of radiation therapy 10/22/15 - 12/08/15   left breast 50.4 Gy, boost to 10 Gy   Hypertension    Migraine    Multifactorial gait disorder 01/12/2013   Neuromuscular disorder ([name] [name])    diabetic neuropathy   Neuropathy    Personal history of radiation therapy 2017   Left Breast Cancer   Posterior tibial tendon dysfunction (PTTD) of right lower extremity 10/09/2019   Sleep apnea    CPAP nightly   Spleen enlarged    Thyroid disease     PAST SURGICAL HISTORY: Past Surgical History:  Procedure Laterality Date  ANKLE FUSION Right 2022   BREAST BIOPSY Left 08/04/2015   Procedure: LEFT BREAST BIOPSY WITH NEEDLE LOCALIZATION;  Surgeon: [name], MD;  Location: [name];  Service: General;  Laterality: Left;   BREAST BIOPSY Right 06/23/2021   BREAST DUCTAL SYSTEM EXCISION Left 08/04/2015   Procedure: LEFT EXCISION DUCTAL SYSTEM BREAST;  Surgeon: [name], MD;  Location: [name];  Service: General;  Laterality: Left;   BREAST LUMPECTOMY Left 08/2015   BREAST LUMPECTOMY WITH AXILLARY LYMPH NODE BIOPSY Left 09/12/2015   Procedure:  RE-EXCISION OF LEFT BREAST LUMPECTOMY WITH LEFT AXILLARY LYMPH NODE BIOPSY;  Surgeon: [name], MD;  Location: [name] [name];  Service: General;  Laterality: Left;   CHOLECYSTECTOMY     EYE SURGERY Bilateral 2022   Lashes growing backwards into the eye   JOINT REPLACEMENT     left knee   JOINT REPLACEMENT  2017   right knee   REVERSE SHOULDER ARTHROPLASTY Right 09/07/2018   Procedure: RIGHT REVERSE SHOULDER ARTHROPLASTY;  Surgeon: Meredith Pel, MD;  Location: [name];  Service: Orthopedics;  Laterality: Right;   TOTAL KNEE ARTHROPLASTY Right 02/28/2015   Procedure: RIGHT TOTAL KNEE ARTHROPLASTY;  Surgeon: [name], MD;  Location: [name];  Service: Orthopedics;  Laterality: Right;   TUBAL LIGATION      FAMILY HISTORY Family History  Problem Relation Age of Onset   Hypothyroidism Mother    Alzheimer's disease Mother    Diabetes Father    Cancer Sister        GYN cancer   Hypothyroidism Sister    Heart disease Brother    Arthritis Son    Diabetes Son    Arthritis Son    Cancer Maternal Grandmother    Heart failure Maternal Grandfather    Bone cancer Paternal Grandmother    Heart failure Paternal Grandmother   The patient's father died from an encephalitis at the age of 88. The patient's mother is living at age 3. The patient had one brother, 3 sisters. A paternal grandf mother had "bone cancer". A paternal aunt, present today also had "bone cancer", but on further questioning this was myeloma. The patient's sister [name] [name] was diagnosed with ovarian and uterine cancer and has been genetically tested but the test was negative   GYNECOLOGIC HISTORY:  No LMP recorded. Patient has had a hysterectomy. Menarche age 36 and first live birth age 76, menopause in her 5s. The patient never took hormone replacement or oral contraceptives.   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 Her  husband [name] [name] is a Games developer. At home is just the 2 of them, the patient's aunt who is under hospice care for her myeloma, and the patient's dog. Son [name] [name] lives in [place] and works for an Scientist, research (medical).Pandora Leiter Jeneen Rinks to be also lives in [place] and is a Tax inspector. Daughter Alinda Money lives in [place] and works for the [employer] school system as F Careers information officer. The patient has 5 grandchildren. She attends a [employer].    ADVANCED DIRECTIVES: In the absence of any documentation to the contrary, the patient's spouse is their HCPOA.    HEALTH MAINTENANCE: Social History   Tobacco Use   Smoking status: Never   Smokeless tobacco: Never  Vaping Use   Vaping Use: Never used  Substance Use Topics   Alcohol use: No   Drug use:  No     Colonoscopy: 2015/Buccini  PAP:  Bone density:   Allergies  Allergen Reactions   Codeine Swelling   Influenza A (H1n1) Monoval Pf Anaphylaxis   Other Anaphylaxis    Does not know what this allergy agent could be; but denied she had any issues with Latex   Hydrocodone-Acetaminophen Other (See Comments)   Lisinopril Other (See Comments)   Metformin Other (See Comments)   Pregabalin Other (See Comments)   Jardiance [Empagliflozin] Other (See Comments)    dehydration    Current Outpatient Medications  Medication Sig Dispense Refill   acetaminophen (TYLENOL) 500 MG tablet Take by mouth.     acetaminophen-codeine (TYLENOL #3) 300-30 MG tablet Take 1 tablet by mouth every 6 (six) hours as needed for moderate pain. 30 tablet 0   albuterol (VENTOLIN HFA) 108 (90 Base) MCG/ACT inhaler 1 puff as needed     anastrozole (ARIMIDEX) 1 MG tablet TAKE 1 TABLET BY MOUTH EVERY DAY 90 tablet 4   Ascorbic Acid (VITAMIN C PO) Take by mouth daily.     benzonatate (TESSALON) 100 MG capsule Take 1 capsule (100 mg total) by mouth 2 (two) times daily as needed for cough. 20 capsule 0   buPROPion (WELLBUTRIN XL) 150  MG 24 hr tablet Take 1 tablet (150 mg total) by mouth every morning. 90 tablet 1   citalopram (CELEXA) 40 MG tablet Take 1 tablet (40 mg total) by mouth daily. 90 tablet 1   Continuous Blood Gluc Receiver (DEXCOM G6 RECEIVER) DEVI Use to check blood sugar daily (Patient not taking: Reported on 06/09/2021) 1 each 0   Continuous Blood Gluc Sensor (DEXCOM G6 SENSOR) MISC Change every 10 days (Patient not taking: Reported on 06/09/2021) 9 each 3   Continuous Blood Gluc Transmit (DEXCOM G6 TRANSMITTER) MISC Change every 3 months (Patient not taking: Reported on 06/09/2021) 1 each 3   Cyanocobalamin (VITAMIN B-12 PO) Take by mouth daily.     dapagliflozin propanediol (FARXIGA) 10 MG TABS tablet Take 1 tablet (10 mg total) by mouth daily before breakfast. 90 tablet 3   fluticasone (FLONASE) 50 MCG/ACT nasal spray Place 1 spray into both nostrils as needed.      gabapentin (NEURONTIN) 600 MG tablet TAKE 1 TABLET IN THE MORNING AND 1 TABLET IN THE AFTERNOON AND 2TABS AT BEDTIME     glucose blood (ACCU-CHEK AVIVA PLUS) test strip Use as instructed to check blood sugar 2 times daily 100 each 5   insulin aspart (FIASP FLEXTOUCH) 100 UNIT/ML FlexTouch Pen Inject 18 Units into the skin with breakfast, with lunch, and with evening meal. 45 mL 3   Insulin Disposable Pump (OMNIPOD 5 G6 INTRO, GEN 5,) KIT 1 Device by Does not apply route every 3 (three) days. (Patient not taking: Reported on 06/09/2021) 1 kit 0   Insulin Disposable Pump (OMNIPOD 5 G6 POD, GEN 5,) MISC 1 Device by Does not apply route every 3 (three) days. (Patient not taking: Reported on 06/09/2021) 6 each 3   Insulin Glargine (BASAGLAR KWIKPEN) 100 UNIT/ML Inject 60 Units into the skin daily. 60 mL 3   Insulin Pen Needle (B-D UF III MINI PEN NEEDLES) 31G X 5 MM MISC USE AS DIRECTED WITH INSULIN EVERY MORNING, NOON, EVENING AND AT BEDTIME 400 each 3   Insulin Pen Needle 30G X 5 MM MISC 1 Device by Does not apply route in the morning, at noon, in the evening, and  at bedtime. 400 each 3   levothyroxine (SYNTHROID)  150 MCG tablet Take 1 tablet (150 mcg total) by mouth daily. 90 tablet 3   liraglutide (VICTOZA) 18 MG/3ML SOPN Inject 1.8 mg into the skin daily. 27 mL 3   losartan (COZAAR) 25 MG tablet Take 1 tablet (25 mg total) by mouth daily. 90 tablet 1   methocarbamol (ROBAXIN) 500 MG tablet Take 1 tablet (500 mg total) by mouth every 8 (eight) hours as needed for muscle spasms. 30 tablet 0   Misc Natural Products (TART CHERRY ADVANCED) CAPS Take 1 capsule by mouth daily.     Multiple Vitamins-Minerals (MULTIVITAMIN WITH MINERALS) tablet Take 1 tablet by mouth daily.     mycophenolate (CELLCEPT) 500 MG tablet Take 1,000 mg by mouth 2 (two) times daily.     Pyridoxine HCl (VITAMIN B-6 PO) Take by mouth daily.     TURMERIC PO Take by mouth daily.     VITAMIN D PO Take by mouth daily.     No current facility-administered medications for this visit.    OBJECTIVE: White woman examined in a wheelchair  There were no vitals filed for this visit.  Wt Readings from Last 3 Encounters:  06/22/21 266 lb 14.4 oz (121.1 kg)  06/09/21 262 lb (118.8 kg)  06/01/21 265 lb 12.8 oz (120.6 kg)   There is no height or weight on file to calculate BMI.    ECOG FS:2 - Symptomatic, <50% confined to bed  Physical Exam Constitutional:      Appearance: Normal appearance.     Comments: In a wheel chair   Neurological:     Mental Status: She is alert.     LAB RESULTS:  CMP     Component Value Date/Time   NA 137 12/25/2019 1315   NA 138 07/05/2018 1635   NA 142 10/06/2016 1211   K 4.1 12/25/2019 1315   K 3.8 10/06/2016 1211   CL 103 12/25/2019 1315   CO2 25 12/25/2019 1315   CO2 25 10/06/2016 1211   GLUCOSE 372 (H) 12/25/2019 1315   GLUCOSE 179 (H) 10/06/2016 1211   BUN 15 12/25/2019 1315   BUN 19 07/05/2018 1635   BUN 16.9 10/06/2016 1211   CREATININE 1.19 (H) 12/25/2019 1315   CREATININE 1.09 (H) 09/28/2017 1516   CREATININE 1.1 10/06/2016 1211    CALCIUM 10.0 12/25/2019 1315   CALCIUM 10.4 10/06/2016 1211   PROT 7.5 12/25/2019 1315   PROT 7.5 07/05/2018 1635   PROT 7.6 10/06/2016 1211   ALBUMIN 3.5 12/25/2019 1315   ALBUMIN 4.6 07/05/2018 1635   ALBUMIN 3.9 10/06/2016 1211   AST 33 12/25/2019 1315   AST 33 10/06/2016 1211   ALT 18 12/25/2019 1315   ALT 20 10/06/2016 1211   ALKPHOS 118 12/25/2019 1315   ALKPHOS 86 10/06/2016 1211   BILITOT 0.4 12/25/2019 1315   BILITOT 0.52 10/06/2016 1211   GFRNONAA 49 (L) 12/25/2019 1315   GFRNONAA 52 (L) 09/28/2017 1516   GFRAA >60 11/07/2018 1133   GFRAA 61 09/28/2017 1516    INo results found for: SPEP, UPEP  Lab Results  Component Value Date   WBC 3.7 02/18/2021   NEUTROABS 2.4 02/18/2021   HGB 12.0 02/18/2021   HCT 36.6 02/18/2021   MCV 88 02/18/2021   PLT 98 (LL) 02/18/2021      Chemistry      Component Value Date/Time   NA 137 12/25/2019 1315   NA 138 07/05/2018 1635   NA 142 10/06/2016 1211   K 4.1 12/25/2019 1315  K 3.8 10/06/2016 1211   CL 103 12/25/2019 1315   CO2 25 12/25/2019 1315   CO2 25 10/06/2016 1211   BUN 15 12/25/2019 1315   BUN 19 07/05/2018 1635   BUN 16.9 10/06/2016 1211   CREATININE 1.19 (H) 12/25/2019 1315   CREATININE 1.09 (H) 09/28/2017 1516   CREATININE 1.1 10/06/2016 1211      Component Value Date/Time   CALCIUM 10.0 12/25/2019 1315   CALCIUM 10.4 10/06/2016 1211   ALKPHOS 118 12/25/2019 1315   ALKPHOS 86 10/06/2016 1211   AST 33 12/25/2019 1315   AST 33 10/06/2016 1211   ALT 18 12/25/2019 1315   ALT 20 10/06/2016 1211   BILITOT 0.4 12/25/2019 1315   BILITOT 0.52 10/06/2016 1211       No results found for: LABCA2  No components found for: LABCA125  No results for input(s): INR in the last 168 hours.  Urinalysis    Component Value Date/Time   COLORURINE YELLOW 08/30/2018 1357   APPEARANCEUR CLEAR 08/30/2018 1357   LABSPEC 1.024 08/30/2018 1357   PHURINE 5.0 08/30/2018 1357   GLUCOSEU >=500 (A) 08/30/2018 1357    HGBUR NEGATIVE 08/30/2018 1357   BILIRUBINUR NEGATIVE 08/30/2018 1357   KETONESUR NEGATIVE 08/30/2018 1357   PROTEINUR NEGATIVE 08/30/2018 1357   UROBILINOGEN 0.2 12/09/2011 1907   NITRITE NEGATIVE 08/30/2018 1357   LEUKOCYTESUR NEGATIVE 08/30/2018 1357    STUDIES: CT Chest Wo Contrast  Result Date: 06/23/2021 CLINICAL DATA:  Hemoptysis.  Productive cough for 4 months. EXAM: CT CHEST WITHOUT CONTRAST TECHNIQUE: Multidetector CT imaging of the chest was performed following the standard protocol without IV contrast. RADIATION DOSE REDUCTION: This exam was performed according to the departmental dose-optimization program which includes automated exposure control, adjustment of the mA and/or kV according to patient size and/or use of iterative reconstruction technique. COMPARISON:  Chest radiograph 02/25/2020. Remote chest CT 12/09/2011 reviewed FINDINGS: Cardiovascular: Minimal atherosclerosis of the thoracic aorta. No aortic aneurysm. The heart is normal in size. There are coronary artery calcifications. No pericardial effusion. Mediastinum/Nodes: Small hiatal hernia. No mediastinal adenopathy. Limited hilar assessment on this unenhanced exam. No thyroid nodule. No axillary adenopathy. Lungs/Pleura: No airspace consolidation. No focal airspace opacity, pulmonary nodule, or mass. Occasional subsegmental atelectasis in the lower lobes. No pleural fluid. No endobronchial lesion. Upper Abdomen: Nodular hepatic contours consistent with cirrhosis. The liver is partially included but appears enlarged. Spleen also partially included but enlarged spanning 15 cm AP. There is a calcified splenic artery aneurysm at the hilum measuring 8 mm. No acute upper abdominal findings. Musculoskeletal: Diffuse thoracic spondylosis with anterior as well as posterior spurring. STIR spurring the lower thoracic spine causes mild narrowing of the spinal canal. No focal bone lesion or acute osseous findings. Reverse right shoulder  arthroplasty. Advanced left glenohumeral osteoarthritis. Surgical clips within the left breast and axilla. IMPRESSION: 1. No acute intrathoracic abnormality. No explanation for cough or hemoptysis. 2. Aortic atherosclerosis.  Coronary artery calcifications. 3. Incidental findings in the upper abdomen of hepatic cirrhosis and splenomegaly. Small hiatal hernia. Aortic Atherosclerosis (ICD10-I70.0). Electronically Signed   By: Keith Rake M.D.   On: 06/23/2021 16:55   US BREAST LTD UNI RIGHT INC AXILLA  Result Date: 06/20/2021 CLINICAL DATA:  Patient returns after screening study for evaluation of possible RIGHT breast asymmetry. Patient has history of LEFT lumpectomy in July 2017. EXAM: DIGITAL DIAGNOSTIC UNILATERAL RIGHT MAMMOGRAM WITH TOMOSYNTHESIS AND CAD; ULTRASOUND RIGHT BREAST LIMITED TECHNIQUE: Right digital diagnostic mammography and breast tomosynthesis was performed.  The images were evaluated with computer-aided detection.; Targeted ultrasound examination of the right breast was performed COMPARISON:  Previous exam(s). ACR Breast Density Category b: There are scattered areas of fibroglandular density. FINDINGS: Additional 2-D and 3-D images are performed. These views confirm presence of focal area of distortion in the LATERAL portion of the RIGHT breast. On physical exam, there is focal palpable thickening corresponding to a 6 centimeter area of ecchymosis in the 10 o'clock location of the RIGHT breast. Otherwise, I palpate no abnormality in the UPPER-OUTER QUADRANT. Patient reports taking a medicine that makes her bruise easily. Targeted ultrasound is performed, showing hypoechoic oval mass with indistinct margins in the 10:30 o'clock location of the RIGHT breast 9 centimeters from nipple measuring 0.5 x 0.4 x 0.4 centimeters. This area is approximately 1.8 centimeters from the palpable bruise. In the area of bruising, focal fat necrosis is identified. This area of fat necrosis is discrete from the  suspicious mass. Evaluation of the RIGHT axilla is negative for adenopathy. IMPRESSION: Suspicious mass in the 10:30 o'clock location of the RIGHT breast 9 centimeters from the nipple correlating with distortion seen mammographically. Discrete area of bruising in the UPPER-OUTER QUADRANT of the RIGHT breast. No axillary adenopathy. RECOMMENDATION: Ultrasound-guided core biopsy of RIGHT breast mass. I have discussed the findings and recommendations with the patient. If applicable, a reminder letter will be sent to the patient regarding the next appointment. BI-RADS CATEGORY  4: Suspicious. Electronically Signed   By: Nolon Nations M.D.   On: 06/20/2021 10:09  MM DIAG BREAST TOMO UNI RIGHT  Result Date: 06/20/2021 CLINICAL DATA:  Patient returns after screening study for evaluation of possible RIGHT breast asymmetry. Patient has history of LEFT lumpectomy in July 2017. EXAM: DIGITAL DIAGNOSTIC UNILATERAL RIGHT MAMMOGRAM WITH TOMOSYNTHESIS AND CAD; ULTRASOUND RIGHT BREAST LIMITED TECHNIQUE: Right digital diagnostic mammography and breast tomosynthesis was performed. The images were evaluated with computer-aided detection.; Targeted ultrasound examination of the right breast was performed COMPARISON:  Previous exam(s). ACR Breast Density Category b: There are scattered areas of fibroglandular density. FINDINGS: Additional 2-D and 3-D images are performed. These views confirm presence of focal area of distortion in the LATERAL portion of the RIGHT breast. On physical exam, there is focal palpable thickening corresponding to a 6 centimeter area of ecchymosis in the 10 o'clock location of the RIGHT breast. Otherwise, I palpate no abnormality in the UPPER-OUTER QUADRANT. Patient reports taking a medicine that makes her bruise easily. Targeted ultrasound is performed, showing hypoechoic oval mass with indistinct margins in the 10:30 o'clock location of the RIGHT breast 9 centimeters from nipple measuring 0.5 x 0.4 x  0.4 centimeters. This area is approximately 1.8 centimeters from the palpable bruise. In the area of bruising, focal fat necrosis is identified. This area of fat necrosis is discrete from the suspicious mass. Evaluation of the RIGHT axilla is negative for adenopathy. IMPRESSION: Suspicious mass in the 10:30 o'clock location of the RIGHT breast 9 centimeters from the nipple correlating with distortion seen mammographically. Discrete area of bruising in the UPPER-OUTER QUADRANT of the RIGHT breast. No axillary adenopathy. RECOMMENDATION: Ultrasound-guided core biopsy of RIGHT breast mass. I have discussed the findings and recommendations with the patient. If applicable, a reminder letter will be sent to the patient regarding the next appointment. BI-RADS CATEGORY  4: Suspicious. Electronically Signed   By: Nolon Nations M.D.   On: 06/20/2021 10:09  MM 3D SCREEN BREAST BILATERAL  Result Date: 06/10/2021 CLINICAL DATA:  Screening. History of LEFT  breast cancer and lumpectomy in 2017. EXAM: DIGITAL SCREENING BILATERAL MAMMOGRAM WITH TOMOSYNTHESIS AND CAD TECHNIQUE: Bilateral screening digital craniocaudal and mediolateral oblique mammograms were obtained. Bilateral screening digital breast tomosynthesis was performed. The images were evaluated with computer-aided detection. COMPARISON:  Previous exam(s). ACR Breast Density Category b: There are scattered areas of fibroglandular density. FINDINGS: In the right breast, a possible asymmetry within the OUTER RIGHT breast on CC views warrants further evaluation. In the left breast, no findings suspicious for malignancy. LEFT lumpectomy changes are again noted. IMPRESSION: Further evaluation is suggested for possible asymmetry in the right breast. RECOMMENDATION: Diagnostic mammogram and possibly ultrasound of the right breast. (Code:[id number]) The patient will be contacted regarding the findings, and additional imaging will be scheduled. BI-RADS CATEGORY  0: Incomplete.  Need additional imaging evaluation and/or prior mammograms for comparison. Electronically Signed   By: Margarette Canada M.D.   On: 06/10/2021 14:34   MM CLIP PLACEMENT RIGHT  Result Date: 06/23/2021 CLINICAL DATA:  Post biopsy mammogram of the right breast for clip placement. EXAM: 3D DIAGNOSTIC RIGHT MAMMOGRAM POST ULTRASOUND BIOPSY COMPARISON:  Previous exam(s). FINDINGS: 3D Mammographic images were obtained following ultrasound guided biopsy of a right breast mass at 10:30. The biopsy marking clip is in expected position at the site of biopsy. IMPRESSION: Appropriate positioning of the ribbon shaped biopsy marking clip at the site of biopsy in the upper-outer right breast. Final Assessment: Post Procedure Mammograms for Marker Placement Electronically Signed   By: Ammie Ferrier M.D.   On: 06/23/2021 14:52  XR Foot Complete Left  Result Date: 06/24/2021 Left foot 3 views: None this placed fracture involving the first proximal phalanx.  No obvious intra-articular involvement.  No other fractures identified throughout the foot.  XR Foot Complete Right  Result Date: 06/24/2021 Right foot 3 views: Status post ankle fusion appears well-healed.  First proximal phalanx fracture involving the proximal portion of the phalanx with minimal displacement but intra-articular involvement.  Comminuted fracture second proximal phalanx base with intra-articular involvement.  No other fractures identified no other acute findings.  XR Shoulder Right  Result Date: 06/24/2021 Right shoulder multiple views.  Images are slightly underpenetrated.  Status post right shoulder arthroplasty.  Shoulder appears overall well located.  No hardware failure or loosening.  [place] RT BREAST BX W LOC DEV 1ST LESION IMG BX SPEC US GUIDE  Addendum Date: 06/25/2021   ADDENDUM REPORT: 06/25/2021 08:29 ADDENDUM: Pathology revealed GRADE I INVASIVE DUCTAL CARCINOMA of the RIGHT breast, 10:30 o'clock, 9 cmfn (ribbon clip). This was found to be  concordant by Dr. Ammie Ferrier. Pathology results were discussed with the patient by telephone. The patient reported doing well after the biopsy with tenderness and bruising at the site. Post biopsy instructions and care were reviewed and questions were answered. The patient was encouraged to call [facility] for any additional concerns. Per patient request, surgical consultation has been arranged with Dr. Nedra Hai at [facility] on July 07, 2021. Recommendation: A bracketed RSL in order to include the calcifications adjacent to the mass as they [place] represent DCIS. Pathology results reported by Stacie Acres RN on 06/24/2021. Electronically Signed   By: Ammie Ferrier M.D.   On: 06/25/2021 08:29   Result Date: 06/25/2021 CLINICAL DATA:  71 year old female presenting for ultrasound-guided biopsy of a right breast mass. EXAM: ULTRASOUND GUIDED RIGHT BREAST CORE NEEDLE BIOPSY COMPARISON:  None Available. PROCEDURE: I met with the patient and we discussed the procedure of ultrasound-guided  biopsy, including benefits and alternatives. We discussed the high likelihood of a successful procedure. We discussed the risks of the procedure, including infection, bleeding, tissue injury, clip migration, and inadequate sampling. Informed written consent was given. The usual time-out protocol was performed immediately prior to the procedure. Lesion quadrant: Upper outer quadrant Using sterile technique and 1% Lidocaine as local anesthetic, under direct ultrasound visualization, a 12 gauge spring-loaded device was used to perform biopsy of a mass in the right breast at 10:30, 9 cm from the nipple using a lateral approach. At the conclusion of the procedure a ribbon shaped shaped tissue marker clip was deployed into the biopsy cavity. Follow up 2 view mammogram was performed and dictated separately. IMPRESSION: Ultrasound guided biopsy of a right breast mass at 10:30. No apparent  complications. Electronically Signed: By: Ammie Ferrier M.D. On: 06/23/2021 14:31    ELIGIBLE FOR AVAILABLE RESEARCH PROTOCOL: no  ASSESSMENT: 71 y.o. [name] [name] woman status post left breast upper outer quadrant lumpectomy 08/04/2015 for a pT1c pNX, stage I A invasive ductal carcinoma, estrogen and progesterone receptor positive, HER-2 not amplified, with no HER-2 amplification.  (a) anterior margin was focally positive  (1) additional Left breast surgery 09/12/2015 cleared the margins and found all 5 axillary lymph nodes sampled clear for a final stage pT1c pN0, stage IA  (2) Oncotype DX score of 12 predicts a 10 year risk of recurrence outside the breast of 8% if the patient's only systemic therapy is tamoxifen for 5 years. It also predicts no benefit from therapy.   (3) Adjuvant radiation 10/22/15 - 12/08/15   1) Left breast: 50.4 Gy in 28 fractions              2) Left breast boost: 10 Gy in 5 fractions  (4) started  anastrozole 02/02/2015, completed 6 yrs.  (5) She has mammogram recently 06/20/2021, possible asymmetry within the OUTER RIGHT breast on CC views warrants further evaluation. Targeted ultrasound is performed, showing hypoechoic oval mass with indistinct margins in the 10:30 o'clock location of the RIGHT breast 9 centimeters from nipple measuring 0.5 x 0.4 x 0.4 centimeters. This area is approximately 1.8 centimeters from the palpable bruise, bruise was thought to be fat necrosis.  PLAN:  She saw Dr [name] [name], would like to proceed with radioactive seed guided right breast lumpectomy. Anticipate in about 2/3 weeks. We will switch anti estrogen therapy to faslodex since she was on anastrazole when she had this new findings She will return to clinic after surgery to review final pathology and discuss adjuvant recommendations. She still struggles with hyperglycemia and eye issues. We will try to get her on started on faslodex DC anastrazole.  RTC in 4 weeks.  Total  time spent: 30 minutes, she is a new patient to me, transitioning from Dr. Jana Hakim upon his retirement. We have reviewed the history as mentioned above.   *Total Encounter Time as defined by the Centers for Medicare and Medicaid Services includes, in addition to the face-to-face time of a patient visit (documented in the note above) non-face-to-face time: obtaining and reviewing outside history, ordering and reviewing medications, tests or procedures, care coordination (communications with other health care professionals or caregivers) and documentation in the medical record.

## 2021-07-12 ENCOUNTER — Encounter: Payer: Self-pay | Admitting: *Deleted

## 2021-07-12 DIAGNOSIS — C50911 Malignant neoplasm of unspecified site of right female breast: Secondary | ICD-10-CM

## 2021-07-14 ENCOUNTER — Other Ambulatory Visit: Payer: Self-pay | Admitting: Surgery

## 2021-07-14 ENCOUNTER — Telehealth: Payer: Self-pay | Admitting: Hematology and Oncology

## 2021-07-14 DIAGNOSIS — Z853 Personal history of malignant neoplasm of breast: Secondary | ICD-10-CM

## 2021-07-14 NOTE — Telephone Encounter (Signed)
.  Called patient to schedule appointment per 6/11 inbasket, patient is aware of date and time.

## 2021-07-15 ENCOUNTER — Ambulatory Visit (INDEPENDENT_AMBULATORY_CARE_PROVIDER_SITE_OTHER): Payer: Medicare Other

## 2021-07-15 ENCOUNTER — Encounter: Payer: Self-pay | Admitting: Orthopaedic Surgery

## 2021-07-15 ENCOUNTER — Ambulatory Visit (INDEPENDENT_AMBULATORY_CARE_PROVIDER_SITE_OTHER): Payer: Medicare Other | Admitting: Orthopaedic Surgery

## 2021-07-15 DIAGNOSIS — M79671 Pain in right foot: Secondary | ICD-10-CM | POA: Diagnosis not present

## 2021-07-15 DIAGNOSIS — M79672 Pain in left foot: Secondary | ICD-10-CM | POA: Diagnosis not present

## 2021-07-15 NOTE — Progress Notes (Signed)
Location of Breast Cancer: Right breast   Histology per Pathology Report: right breast invasive ductal carcinoma  Receptor Status: ER(90%), PR (60%), Her2-neu (negative), Ki-(2%)  Did patient present with symptoms (if so, please note symptoms) or was this found on screening mammography?: screening mammogram  Past/Anticipated interventions by surgeon, if any: Right breast lumpectomy planned for 08/12/2021 with Dr. Ninfa Linden  Past/Anticipated interventions by medical oncology, if any: Anastrazole discontinued.  Started Faslodex 2 weeks ago.  Lymphedema issues, if any:  no    Pain issues, if any:  no   SAFETY ISSUES: Prior radiation? yes, 10/22/15 - 12/08/15 Left breast 50.4 Gy in 28 fractions and boost 10 Gy in 5 fractions Pacemaker/ICD? no Possible current pregnancy?no Is the patient on methotrexate? no  Current Complaints / other details:  Has some discomfort under her left arm and trouble with ROM on that side.  BP (!) 150/67 (BP Location: Right Arm, Patient Position: Sitting)   Pulse 87   Temp (!) 97.3 F (36.3 C) (Temporal)   Resp 18   Ht _0  (1.549 m)   Wt 268 lb 6 oz (121.7 kg)   SpO2 96%   BMI 50.71 kg/m    [name] Liner, RN 07/15/2021,12:50 PM

## 2021-07-15 NOTE — Progress Notes (Signed)
[name] [name] is about 3 weeks into a mechanical fall down some stairs where she injured both her feet.  She does have diabetic neuropathy.  She has been in postoperative shoes due to fractures of the great toe on both feet and the second toe on the right foot.  She reports that she is doing well overall.  She has been wearing her postop shoes but does have crocs with her today that she also wears.  She ambulates with a rolling walker.  Her last hemoglobin A1c a month ago was over 8.  She is to continue to struggle with blood glucose control.  She ambulates slowly.  She denies any significant pain but a lot of this is due to her bilateral neuropathy.  Both feet have normal contours.  The swelling is gone down significantly of both feet I see no complicating features of her fractures.  X-rays of both feet show a fracture of the proximal phalanx on both great toes and the proximal phalanx of the right second toe.  These were both in acceptable alignment in terms of all the fractures.  She can transition back to regular shoewear as she is comfortable.  I have counseled her about good blood glucose control.  We do not need to x-ray her feet again since she does not perform any high impact aerobic activities and ambulates and mobilizes very slowly.  All questions and concerns were answered and addressed.

## 2021-07-17 NOTE — Progress Notes (Signed)
Radiation Oncology         [phone] ________________________________  Initial Outpatient Consultation  Name: [name] [name] MRN: [id number]  Date: 07/20/2021  DOB: [birth year]  XB:[id number], Colin Broach, MD  [name], MD   REFERRING PHYSICIAN: [name], MD  DIAGNOSIS: There were no encounter diagnoses.  Stage *** (***) Right Breast UOQ, Invasive Ductal Carcinoma, ER+ / PR+ / Her2-, Grade 1  History of Stage IA (cT1c, cN0, cM0) Left breast Invasive Ductal Carcinoma diagnosed in 2017; s/p lumpectomy, radiation, and antiestrogens (anastrozle)   HISTORY OF PRESENT ILLNESS::[name] [name] is a 71 y.o. female who is accompanied by ***. she is seen as a courtesy of Dr. Chryl Heck for an opinion concerning radiation therapy as part of management for her recently diagnosed right breast cancer. The patient has a history notable for left breast cancer (grade 2 IDC) diagnosed in 2017, s/p lumpectomy performed on 08/04/2015 (anterior margin was focally positive but later reexcised with clear final margins) and XRT. She also recently completed 6 years of anastrozole (Dr. Chryl Heck) which was changed to faslodex given her new findings.   The patient presented for a routine screening mammogram on 06/10/21 which showed a possible asymmetry in the right breast. Diagnostic right breast mammogram and right breast ultrasound performed at [facility] on 06/20/21 further revealed a suspicious mass in the 10:30 o'clock location of the right breast, 9 cmfn. A discrete area of bruising was also seen in the upper outer quadrant of the right breast. No right axillary adenopathy was appreciated.   Biopsy of the 10:30 o'clock right breast on 06/23/21 revealed grade 1 invasive ductal carcinoma measuring 1 cm in the greatest linear extent. Prognostic indicators significant for: estrogen receptor 90% positive with strong staining intensity; progesterone receptor 60% positive with moderate staining intensity; Proliferation  marker Ki67 at 2%; Her2 status negative; Grade 1.   Accordingly, the patient was referred to Dr. Ninfa Linden on 07/07/21 to discuss surgical treatment options. Following discussion of the risks and benefits, the patient opted to proceed with breast conserving surgery. She is scheduled for surgery on 08/12/21.   The patient also followed up with Dr. Chryl Heck on 07/08/21. During which time, Dr. Chryl Heck started the patient on faslodex given that she developed another breast cancer while on anastrozole. Following her breat conserving surgery, the patient will return to Dr. Chryl Heck to discuss adjuvant treatment options.  Of note: The patient has a chest CT performed on 06/22/21 for evaluation of hemoptysis / productive cough x 4 months. Imaging showed no acute intrathoracic abnormalities / explanation for her cough or hemoptysis. She also had a CT of the face/sinuses on 06/30/21 (due to a history of recurrent sinusitis) which showed  no substantial paranasal sinus mucosal disease.   PREVIOUS RADIATION THERAPY: Yes,   Adjuvant radiation 10/22/15 - 12/08/15              1) Left breast: 50.4 Gy in 28 fractions              2) Left breast boost: 10 Gy in 5 fractions  PAST MEDICAL HISTORY:  Past Medical History:  Diagnosis Date   Abdominal wall contusion 12/09/2011   Achilles tendon contracture, bilateral 06/03/2016   Anxiety    Arthritis    Blood dyscrasia    low platelet count- sees [name] , [name] ( Note in EPIC from 07/2014) at [facility]) 2017   Left Breast   Cancer ([place])  left breast   Chest wall contusion 12/09/2011   Cirrhosis of liver ([facility])    Cough    Diabetes mellitus without complication ([place])    Diabetic neuropathy ([place])    Diarrhea due to drug 01/29/2021   Gout    Gout    History of radiation therapy 10/22/15 - 12/08/15   left breast 50.4 Gy, boost to 10 Gy   Hypertension    Migraine    Multifactorial gait disorder 01/12/2013   Neuromuscular  disorder ([place])    diabetic neuropathy   Neuropathy    Personal history of radiation therapy 2017   Left Breast Cancer   Posterior tibial tendon dysfunction (PTTD) of right lower extremity 10/09/2019   Sleep apnea    CPAP nightly   Spleen enlarged    Thyroid disease     PAST SURGICAL HISTORY: Past Surgical History:  Procedure Laterality Date   ANKLE FUSION Right 2022   BREAST BIOPSY Left 08/04/2015   Procedure: LEFT BREAST BIOPSY WITH NEEDLE LOCALIZATION;  Surgeon: [name], MD;  Location: [place];  Service: General;  Laterality: Left;   BREAST BIOPSY Right 06/23/2021   BREAST DUCTAL SYSTEM EXCISION Left 08/04/2015   Procedure: LEFT EXCISION DUCTAL SYSTEM BREAST;  Surgeon: [name], MD;  Location: [place];  Service: General;  Laterality: Left;   BREAST LUMPECTOMY Left 08/2015   BREAST LUMPECTOMY WITH AXILLARY LYMPH NODE BIOPSY Left 09/12/2015   Procedure: RE-EXCISION OF LEFT BREAST LUMPECTOMY WITH LEFT AXILLARY LYMPH NODE BIOPSY;  Surgeon: [name], MD;  Location: [place];  Service: General;  Laterality: Left;   CHOLECYSTECTOMY     EYE SURGERY Bilateral 2022   Lashes growing backwards into the eye   JOINT REPLACEMENT     left knee   JOINT REPLACEMENT  2017   right knee   REVERSE SHOULDER ARTHROPLASTY Right 09/07/2018   Procedure: RIGHT REVERSE SHOULDER ARTHROPLASTY;  Surgeon: Meredith Pel, MD;  Location: [place];  Service: Orthopedics;  Laterality: Right;   TOTAL KNEE ARTHROPLASTY Right 02/28/2015   Procedure: RIGHT TOTAL KNEE ARTHROPLASTY;  Surgeon: [name], MD;  Location: [facility];  Service: Orthopedics;  Laterality: Right;   TUBAL LIGATION      FAMILY HISTORY:  Family History  Problem Relation Age of Onset   Hypothyroidism Mother    Alzheimer's disease Mother    Diabetes Father    Cancer Sister        GYN cancer   Hypothyroidism Sister    Heart disease Brother    Arthritis Son    Diabetes  Son    Arthritis Son    Cancer Maternal Grandmother    Heart failure Maternal Grandfather    Bone cancer Paternal Grandmother    Heart failure Paternal Grandmother     SOCIAL HISTORY:  Social History   Tobacco Use   Smoking status: Never   Smokeless tobacco: Never  Vaping Use   Vaping Use: Never used  Substance Use Topics   Alcohol use: No   Drug use: No    ALLERGIES:  Allergies  Allergen Reactions   Codeine Swelling   Influenza A (H1n1) Monoval Pf Anaphylaxis   Other Anaphylaxis    Does not know what this allergy agent could be; but denied she had any issues with Latex   Hydrocodone-Acetaminophen Other (See Comments)   Lisinopril Other (See Comments)   Metformin Other (See Comments)   Pregabalin Other (See Comments)   Jardiance [Empagliflozin] Other (See  Comments)    dehydration    MEDICATIONS:  Current Outpatient Medications  Medication Sig Dispense Refill   acetaminophen (TYLENOL) 500 MG tablet Take by mouth.     acetaminophen-codeine (TYLENOL #3) 300-30 MG tablet Take 1 tablet by mouth every 6 (six) hours as needed for moderate pain. 30 tablet 0   albuterol (VENTOLIN HFA) 108 (90 Base) MCG/ACT inhaler 1 puff as needed     anastrozole (ARIMIDEX) 1 MG tablet TAKE 1 TABLET BY MOUTH EVERY DAY 90 tablet 4   Ascorbic Acid (VITAMIN C PO) Take by mouth daily.     benzonatate (TESSALON) 100 MG capsule Take 1 capsule (100 mg total) by mouth 2 (two) times daily as needed for cough. 20 capsule 0   buPROPion (WELLBUTRIN XL) 150 MG 24 hr tablet Take 1 tablet (150 mg total) by mouth every morning. 90 tablet 1   citalopram (CELEXA) 40 MG tablet Take 1 tablet (40 mg total) by mouth daily. 90 tablet 1   Cyanocobalamin (VITAMIN B-12 PO) Take by mouth daily.     dapagliflozin propanediol (FARXIGA) 10 MG TABS tablet Take 1 tablet (10 mg total) by mouth daily before breakfast. 90 tablet 3   fluticasone (FLONASE) 50 MCG/ACT nasal spray Place 1 spray into both nostrils as needed.       gabapentin (NEURONTIN) 600 MG tablet TAKE 1 TABLET IN THE MORNING AND 1 TABLET IN THE AFTERNOON AND 2TABS AT BEDTIME     glucose blood (ACCU-CHEK AVIVA PLUS) test strip Use as instructed to check blood sugar 2 times daily 100 each 5   insulin aspart (FIASP FLEXTOUCH) 100 UNIT/ML FlexTouch Pen Inject 18 Units into the skin with breakfast, with lunch, and with evening meal. 45 mL 3   Insulin Disposable Pump (OMNIPOD 5 G6 INTRO, GEN 5,) KIT 1 Device by Does not apply route every 3 (three) days. (Patient not taking: Reported on 06/09/2021) 1 kit 0   Insulin Disposable Pump (OMNIPOD 5 G6 POD, GEN 5,) MISC 1 Device by Does not apply route every 3 (three) days. (Patient not taking: Reported on 06/09/2021) 6 each 3   Insulin Glargine (BASAGLAR KWIKPEN) 100 UNIT/ML Inject 60 Units into the skin daily. 60 mL 3   Insulin Pen Needle (B-D UF III MINI PEN NEEDLES) 31G X 5 MM MISC USE AS DIRECTED WITH INSULIN EVERY MORNING, NOON, EVENING AND AT BEDTIME 400 each 3   Insulin Pen Needle 30G X 5 MM MISC 1 Device by Does not apply route in the morning, at noon, in the evening, and at bedtime. 400 each 3   levothyroxine (SYNTHROID) 150 MCG tablet Take 1 tablet (150 mcg total) by mouth daily. 90 tablet 3   liraglutide (VICTOZA) 18 MG/3ML SOPN Inject 1.8 mg into the skin daily. 27 mL 3   losartan (COZAAR) 25 MG tablet Take 1 tablet (25 mg total) by mouth daily. 90 tablet 1   methocarbamol (ROBAXIN) 500 MG tablet Take 1 tablet (500 mg total) by mouth every 8 (eight) hours as needed for muscle spasms. 30 tablet 0   Misc Natural Products (TART CHERRY ADVANCED) CAPS Take 1 capsule by mouth daily.     Multiple Vitamins-Minerals (MULTIVITAMIN WITH MINERALS) tablet Take 1 tablet by mouth daily.     mycophenolate (CELLCEPT) 500 MG tablet Take 1,000 mg by mouth 2 (two) times daily.     Pyridoxine HCl (VITAMIN B-6 PO) Take by mouth daily.     TURMERIC PO Take by mouth daily.  VITAMIN D PO Take by mouth daily.     No current  facility-administered medications for this encounter.    REVIEW OF SYSTEMS:  A 10+ POINT REVIEW OF SYSTEMS WAS OBTAINED including neurology, dermatology, psychiatry, cardiac, respiratory, lymph, extremities, GI, GU, musculoskeletal, constitutional, reproductive, HEENT. ***   PHYSICAL EXAM:  vitals were not taken for this visit.   General: Alert and oriented, in no acute distress HEENT: Head is normocephalic. Extraocular movements are intact. Oropharynx is clear. Neck: Neck is supple, no palpable cervical or supraclavicular lymphadenopathy. Heart: Regular in rate and rhythm with no murmurs, rubs, or gallops. Chest: Clear to auscultation bilaterally, with no rhonchi, wheezes, or rales. Abdomen: Soft, nontender, nondistended, with no rigidity or guarding. Extremities: No cyanosis or edema. Lymphatics: see Neck Exam Skin: No concerning lesions. Musculoskeletal: symmetric strength and muscle tone throughout. Neurologic: Cranial nerves II through XII are grossly intact. No obvious focalities. Speech is fluent. Coordination is intact. Psychiatric: Judgment and insight are intact. Affect is appropriate.  Right Breast: *** Left Breast: No palpable mass, nipple discharge or bleeding. S/p  lumpectomy.   ECOG = ***  0 - Asymptomatic (Fully active, able to carry on all predisease activities without restriction)  1 - Symptomatic but completely ambulatory (Restricted in physically strenuous activity but ambulatory and able to carry out work of a light or sedentary nature. For example, light housework, office work)  2 - Symptomatic, <50% in bed during the day (Ambulatory and capable of all self care but unable to carry out any work activities. Up and about more than 50% of waking hours)  3 - Symptomatic, >50% in bed, but not bedbound (Capable of only limited self-care, confined to bed or chair 50% or more of waking hours)  4 - Bedbound (Completely disabled. Cannot carry on any self-care. Totally  confined to bed or chair)  5 - Death   Eustace Pen MM, Creech RH, Tormey DC, et al. [phone]). "Toxicity and response criteria of the [facility]". [place] Oncol. 5 (6): 649-55  LABORATORY DATA:  Lab Results  Component Value Date   WBC 3.7 02/18/2021   HGB 12.0 02/18/2021   HCT 36.6 02/18/2021   MCV 88 02/18/2021   PLT 98 (LL) 02/18/2021   NEUTROABS 2.4 02/18/2021   Lab Results  Component Value Date   NA 137 12/25/2019   K 4.1 12/25/2019   CL 103 12/25/2019   CO2 25 12/25/2019   GLUCOSE 372 (H) 12/25/2019   BUN 15 12/25/2019   CREATININE 1.19 (H) 12/25/2019   CALCIUM 10.0 12/25/2019      RADIOGRAPHY: XR Foot Complete Right  Result Date: 07/15/2021 3 views of the right foot show fractures of the great toe proximal phalanx and the second toe proximal phalanx that are well aligned.  XR Foot Complete Left  Result Date: 07/15/2021 3 views left foot show a fracture of the proximal phalanx of the great toe that is slightly displaced.  The MTP joint is well located.  There is arthritic changes throughout the foot and calcification of vessels.  [place] RT BREAST BX W LOC DEV 1ST LESION IMG BX SPEC US GUIDE  Addendum Date: 06/25/2021   ADDENDUM REPORT: 06/25/2021 08:29 ADDENDUM: Pathology revealed GRADE I INVASIVE DUCTAL CARCINOMA of the RIGHT breast, 10:30 o'clock, 9 cmfn (ribbon clip). This was found to be concordant by Dr. Ammie Ferrier. Pathology results were discussed with the patient by telephone. The patient reported doing well after the biopsy with tenderness and bruising at  the site. Post biopsy instructions and care were reviewed and questions were answered. The patient was encouraged to call [facility] for any additional concerns. Per patient request, surgical consultation has been arranged with Dr. Nedra Hai at [facility] on July 07, 2021. Recommendation: A bracketed RSL in order to include the calcifications  adjacent to the mass as they Bello represent DCIS. Pathology results reported by Stacie Acres RN on 06/24/2021. Electronically Signed   By: Ammie Ferrier M.D.   On: 06/25/2021 08:29   Result Date: 06/25/2021 CLINICAL DATA:  71 year old female presenting for ultrasound-guided biopsy of a right breast mass. EXAM: ULTRASOUND GUIDED RIGHT BREAST CORE NEEDLE BIOPSY COMPARISON:  None Available. PROCEDURE: I met with the patient and we discussed the procedure of ultrasound-guided biopsy, including benefits and alternatives. We discussed the high likelihood of a successful procedure. We discussed the risks of the procedure, including infection, bleeding, tissue injury, clip migration, and inadequate sampling. Informed written consent was given. The usual time-out protocol was performed immediately prior to the procedure. Lesion quadrant: Upper outer quadrant Using sterile technique and 1% Lidocaine as local anesthetic, under direct ultrasound visualization, a 12 gauge spring-loaded device was used to perform biopsy of a mass in the right breast at 10:30, 9 cm from the nipple using a lateral approach. At the conclusion of the procedure a ribbon shaped shaped tissue marker clip was deployed into the biopsy cavity. Follow up 2 view mammogram was performed and dictated separately. IMPRESSION: Ultrasound guided biopsy of a right breast mass at 10:30. No apparent complications. Electronically Signed: By: Ammie Ferrier M.D. On: 06/23/2021 14:31  XR Shoulder Right  Result Date: 06/24/2021 Right shoulder multiple views.  Images are slightly underpenetrated.  Status post right shoulder arthroplasty.  Shoulder appears overall well located.  No hardware failure or loosening.  XR Foot Complete Left  Result Date: 06/24/2021 Left foot 3 views: None this placed fracture involving the first proximal phalanx.  No obvious intra-articular involvement.  No other fractures identified throughout the foot.  XR Foot Complete  Right  Result Date: 06/24/2021 Right foot 3 views: Status post ankle fusion appears well-healed.  First proximal phalanx fracture involving the proximal portion of the phalanx with minimal displacement but intra-articular involvement.  Comminuted fracture second proximal phalanx base with intra-articular involvement.  No other fractures identified no other acute findings.  CT Chest Wo Contrast  Result Date: 06/23/2021 CLINICAL DATA:  Hemoptysis.  Productive cough for 4 months. EXAM: CT CHEST WITHOUT CONTRAST TECHNIQUE: Multidetector CT imaging of the chest was performed following the standard protocol without IV contrast. RADIATION DOSE REDUCTION: This exam was performed according to the departmental dose-optimization program which includes automated exposure control, adjustment of the mA and/or kV according to patient size and/or use of iterative reconstruction technique. COMPARISON:  Chest radiograph 02/25/2020. Remote chest CT 12/09/2011 reviewed FINDINGS: Cardiovascular: Minimal atherosclerosis of the thoracic aorta. No aortic aneurysm. The heart is normal in size. There are coronary artery calcifications. No pericardial effusion. Mediastinum/Nodes: Small hiatal hernia. No mediastinal adenopathy. Limited hilar assessment on this unenhanced exam. No thyroid nodule. No axillary adenopathy. Lungs/Pleura: No airspace consolidation. No focal airspace opacity, pulmonary nodule, or mass. Occasional subsegmental atelectasis in the lower lobes. No pleural fluid. No endobronchial lesion. Upper Abdomen: Nodular hepatic contours consistent with cirrhosis. The liver is partially included but appears enlarged. Spleen also partially included but enlarged spanning 15 cm AP. 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 No acute upper abdominal findings. Musculoskeletal: Diffuse thoracic spondylosis with anterior as well as posterior spurring. STIR spurring the lower thoracic spine causes mild  narrowing of the spinal canal. No focal bone lesion or acute osseous findings. Reverse right shoulder arthroplasty. Advanced left glenohumeral osteoarthritis. Surgical clips within the left breast and axilla. IMPRESSION: 1. No acute intrathoracic abnormality. No explanation for cough or hemoptysis. 2. Aortic atherosclerosis.  Coronary artery calcifications. 3. Incidental findings in the upper abdomen of hepatic cirrhosis and splenomegaly. Small hiatal hernia. Aortic Atherosclerosis (ICD10-I70.0). Electronically Signed   By: Keith Rake M.D.   On: 06/23/2021 16:55   MM CLIP PLACEMENT RIGHT  Result Date: 06/23/2021 CLINICAL DATA:  Post biopsy mammogram of the right breast for clip placement. EXAM: 3D DIAGNOSTIC RIGHT MAMMOGRAM POST ULTRASOUND BIOPSY COMPARISON:  Previous exam(s). FINDINGS: 3D Mammographic images were obtained following ultrasound guided biopsy of a right breast mass at 10:30. The biopsy marking clip is in expected position at the site of biopsy. IMPRESSION: Appropriate positioning of the ribbon shaped biopsy marking clip at the site of biopsy in the upper-outer right breast. Final Assessment: Post Procedure Mammograms for Marker Placement Electronically Signed   By: Ammie Ferrier M.D.   On: 06/23/2021 14:52  MM DIAG BREAST TOMO UNI RIGHT  Result Date: 06/20/2021 CLINICAL DATA:  Patient returns after screening study for evaluation of possible RIGHT breast asymmetry. Patient has history of LEFT lumpectomy in July 2017. EXAM: DIGITAL DIAGNOSTIC UNILATERAL RIGHT MAMMOGRAM WITH TOMOSYNTHESIS AND CAD; ULTRASOUND RIGHT BREAST LIMITED TECHNIQUE: Right digital diagnostic mammography and breast tomosynthesis was performed. The images were evaluated with computer-aided detection.; Targeted ultrasound examination of the right breast was performed COMPARISON:  Previous exam(s). ACR Breast Density Category b: There are scattered areas of fibroglandular density. FINDINGS: Additional 2-D and 3-D  images are performed. These views confirm presence of focal area of distortion in the LATERAL portion of the RIGHT breast. On physical exam, there is focal palpable thickening corresponding to a 6 centimeter area of ecchymosis in the 10 o'clock location of the RIGHT breast. Otherwise, I palpate no abnormality in the UPPER-OUTER QUADRANT. Patient reports taking a medicine that makes her bruise easily. Targeted ultrasound is performed, showing hypoechoic oval mass with indistinct margins in the 10:30 o'clock location of the RIGHT breast 9 centimeters from nipple measuring 0.5 x 0.4 x 0.4 centimeters. This area is approximately 1.8 centimeters from the palpable bruise. In the area of bruising, focal fat necrosis is identified. This area of fat necrosis is discrete from the suspicious mass. Evaluation of the RIGHT axilla is negative for adenopathy. IMPRESSION: Suspicious mass in the 10:30 o'clock location of the RIGHT breast 9 centimeters from the nipple correlating with distortion seen mammographically. Discrete area of bruising in the UPPER-OUTER QUADRANT of the RIGHT breast. No axillary adenopathy. RECOMMENDATION: Ultrasound-guided core biopsy of RIGHT breast mass. I have discussed the findings and recommendations with the patient. If applicable, a reminder letter will be sent to the patient regarding the next appointment. BI-RADS CATEGORY  4: Suspicious. Electronically Signed   By: Nolon Nations M.D.   On: 06/20/2021 10:09  US BREAST LTD UNI RIGHT INC AXILLA  Result Date: 06/20/2021 CLINICAL DATA:  Patient returns after screening study for evaluation of possible RIGHT breast asymmetry. Patient has history of LEFT lumpectomy in July 2017. EXAM: DIGITAL DIAGNOSTIC UNILATERAL RIGHT MAMMOGRAM WITH TOMOSYNTHESIS AND CAD; ULTRASOUND RIGHT BREAST LIMITED TECHNIQUE: Right digital diagnostic mammography and breast tomosynthesis was performed. The images were evaluated with  computer-aided detection.; Targeted  ultrasound examination of the right breast was performed COMPARISON:  Previous exam(s). ACR Breast Density Category b: There are scattered areas of fibroglandular density. FINDINGS: Additional 2-D and 3-D images are performed. These views confirm presence of focal area of distortion in the LATERAL portion of the RIGHT breast. On physical exam, there is focal palpable thickening corresponding to a 6 centimeter area of ecchymosis in the 10 o'clock location of the RIGHT breast. Otherwise, I palpate no abnormality in the UPPER-OUTER QUADRANT. Patient reports taking a medicine that makes her bruise easily. Targeted ultrasound is performed, showing hypoechoic oval mass with indistinct margins in the 10:30 o'clock location of the RIGHT breast 9 centimeters from nipple measuring 0.5 x 0.4 x 0.4 centimeters. This area is approximately 1.8 centimeters from the palpable bruise. In the area of bruising, focal fat necrosis is identified. This area of fat necrosis is discrete from the suspicious mass. Evaluation of the RIGHT axilla is negative for adenopathy. IMPRESSION: Suspicious mass in the 10:30 o'clock location of the RIGHT breast 9 centimeters from the nipple correlating with distortion seen mammographically. Discrete area of bruising in the UPPER-OUTER QUADRANT of the RIGHT breast. No axillary adenopathy. RECOMMENDATION: Ultrasound-guided core biopsy of RIGHT breast mass. I have discussed the findings and recommendations with the patient. If applicable, a reminder letter will be sent to the patient regarding the next appointment. BI-RADS CATEGORY  4: Suspicious. Electronically Signed   By: Nolon Nations M.D.   On: 06/20/2021 10:09     IMPRESSION: Stage *** (***) Right Breast UOQ, Invasive Ductal Carcinoma, ER+ / PR+ / Her2-, Grade 1  ***  Today, I talked to the patient and family about the findings and work-up thus far.  We discussed the natural history of *** and general treatment, highlighting the role of  radiotherapy in the management.  We discussed the available radiation techniques, and focused on the details of logistics and delivery.  We reviewed the anticipated acute and late sequelae associated with radiation in this setting.  The patient was encouraged to ask questions that I answered to the best of my ability. *** A patient consent form was discussed and signed.  We retained a copy for our records.  The patient would like to proceed with radiation and will be scheduled for CT simulation.  PLAN: ***    *** minutes of total time was spent for this patient encounter, including preparation, face-to-face counseling with the patient and coordination of care, physical exam, and documentation of the encounter.   ------------------------------------------------  Blair Promise, PhD, MD  This document serves as a record of services personally performed by Gery Pray, MD. It was created on his behalf by Roney Mans, a trained medical scribe. The creation of this record is based on the scribe's personal observations and the provider's statements to them. This document has been checked and approved by the attending provider.

## 2021-07-20 ENCOUNTER — Ambulatory Visit
Admission: RE | Admit: 2021-07-20 | Discharge: 2021-07-20 | Disposition: A | Discharge status: Home / Self Care | Payer: Medicare Other | Source: Ambulatory Visit | Attending: Radiation Oncology | Admitting: Radiation Oncology

## 2021-07-20 ENCOUNTER — Other Ambulatory Visit: Payer: Self-pay

## 2021-07-20 ENCOUNTER — Inpatient Hospital Stay: Payer: Medicare Other

## 2021-07-20 VITALS — BP 150/67 | HR 87 | Temp 97.3°F | Resp 18 | Ht 61.0 in | Wt 268.4 lb

## 2021-07-20 DIAGNOSIS — I2510 Atherosclerotic heart disease of native coronary artery without angina pectoris: Secondary | ICD-10-CM | POA: Diagnosis not present

## 2021-07-20 DIAGNOSIS — Z5111 Encounter for antineoplastic chemotherapy: Secondary | ICD-10-CM | POA: Diagnosis not present

## 2021-07-20 DIAGNOSIS — K449 Diaphragmatic hernia without obstruction or gangrene: Secondary | ICD-10-CM | POA: Insufficient documentation

## 2021-07-20 DIAGNOSIS — E1140 Type 2 diabetes mellitus with diabetic neuropathy, unspecified: Secondary | ICD-10-CM | POA: Insufficient documentation

## 2021-07-20 DIAGNOSIS — Z794 Long term (current) use of insulin: Secondary | ICD-10-CM | POA: Insufficient documentation

## 2021-07-20 DIAGNOSIS — I10 Essential (primary) hypertension: Secondary | ICD-10-CM | POA: Insufficient documentation

## 2021-07-20 DIAGNOSIS — R042 Hemoptysis: Secondary | ICD-10-CM | POA: Diagnosis not present

## 2021-07-20 DIAGNOSIS — Z853 Personal history of malignant neoplasm of breast: Secondary | ICD-10-CM | POA: Diagnosis not present

## 2021-07-20 DIAGNOSIS — Z79899 Other long term (current) drug therapy: Secondary | ICD-10-CM | POA: Diagnosis not present

## 2021-07-20 DIAGNOSIS — K7460 Unspecified cirrhosis of liver: Secondary | ICD-10-CM | POA: Insufficient documentation

## 2021-07-20 DIAGNOSIS — Z923 Personal history of irradiation: Secondary | ICD-10-CM | POA: Diagnosis not present

## 2021-07-20 DIAGNOSIS — Z170 Estrogen receptor positive status [ER+]: Secondary | ICD-10-CM | POA: Diagnosis not present

## 2021-07-20 DIAGNOSIS — I700 Atherosclerosis of aorta: Secondary | ICD-10-CM | POA: Insufficient documentation

## 2021-07-20 DIAGNOSIS — Z809 Family history of malignant neoplasm, unspecified: Secondary | ICD-10-CM | POA: Diagnosis not present

## 2021-07-20 DIAGNOSIS — C50411 Malignant neoplasm of upper-outer quadrant of right female breast: Secondary | ICD-10-CM | POA: Insufficient documentation

## 2021-07-20 DIAGNOSIS — G4730 Sleep apnea, unspecified: Secondary | ICD-10-CM | POA: Insufficient documentation

## 2021-07-20 DIAGNOSIS — C50911 Malignant neoplasm of unspecified site of right female breast: Secondary | ICD-10-CM

## 2021-07-20 DIAGNOSIS — Z7969 Long term (current) use of other immunomodulators and immunosuppressants: Secondary | ICD-10-CM | POA: Insufficient documentation

## 2021-07-20 DIAGNOSIS — C50412 Malignant neoplasm of upper-outer quadrant of left female breast: Secondary | ICD-10-CM | POA: Diagnosis not present

## 2021-07-20 MED ORDER — FULVESTRANT 250 MG/5ML IM SOSY
500.0000 mg | PREFILLED_SYRINGE | Freq: Once | INTRAMUSCULAR | Status: AC
  Administered 2021-07-20: 500 mg via INTRAMUSCULAR
  Filled 2021-07-20: qty 10

## 2021-07-28 ENCOUNTER — Other Ambulatory Visit: Payer: Self-pay

## 2021-07-28 ENCOUNTER — Encounter (HOSPITAL_BASED_OUTPATIENT_CLINIC_OR_DEPARTMENT_OTHER): Payer: Self-pay | Admitting: Surgery

## 2021-07-29 ENCOUNTER — Telehealth: Payer: Self-pay | Admitting: Internal Medicine

## 2021-07-29 ENCOUNTER — Telehealth: Payer: Self-pay

## 2021-07-29 NOTE — Telephone Encounter (Signed)
[name] [name] with [facility], called stating they received the prior authorization for the blood pressure cuff but didn't receive the office notes. Wants to know if you can please resend the office notes from her last office visit?   [name] Fax# [phone]

## 2021-07-29 NOTE — Telephone Encounter (Signed)
Sophia Called from [employer] faxed over a 2 pageDME for prior authorization medical Free style Comstock monitor. [phone].

## 2021-07-29 NOTE — Telephone Encounter (Signed)
Will await fax.

## 2021-07-30 NOTE — Telephone Encounter (Signed)
Notes faxed to [employer]

## 2021-08-01 HISTORY — PX: BREAST LUMPECTOMY: SHX2

## 2021-08-10 ENCOUNTER — Encounter (HOSPITAL_BASED_OUTPATIENT_CLINIC_OR_DEPARTMENT_OTHER)
Admission: RE | Admit: 2021-08-10 | Discharge: 2021-08-10 | Disposition: A | Discharge status: Home / Self Care | Payer: Medicare Other | Source: Ambulatory Visit | Attending: Surgery | Admitting: Surgery

## 2021-08-10 DIAGNOSIS — Z79899 Other long term (current) drug therapy: Secondary | ICD-10-CM | POA: Diagnosis not present

## 2021-08-10 DIAGNOSIS — J449 Chronic obstructive pulmonary disease, unspecified: Secondary | ICD-10-CM | POA: Diagnosis not present

## 2021-08-10 DIAGNOSIS — F32A Depression, unspecified: Secondary | ICD-10-CM | POA: Diagnosis not present

## 2021-08-10 DIAGNOSIS — I10 Essential (primary) hypertension: Secondary | ICD-10-CM | POA: Diagnosis not present

## 2021-08-10 DIAGNOSIS — E039 Hypothyroidism, unspecified: Secondary | ICD-10-CM | POA: Diagnosis not present

## 2021-08-10 DIAGNOSIS — Z01812 Encounter for preprocedural laboratory examination: Secondary | ICD-10-CM | POA: Diagnosis not present

## 2021-08-10 DIAGNOSIS — C50411 Malignant neoplasm of upper-outer quadrant of right female breast: Secondary | ICD-10-CM | POA: Diagnosis not present

## 2021-08-10 DIAGNOSIS — Z794 Long term (current) use of insulin: Secondary | ICD-10-CM | POA: Diagnosis not present

## 2021-08-10 DIAGNOSIS — F419 Anxiety disorder, unspecified: Secondary | ICD-10-CM | POA: Diagnosis not present

## 2021-08-10 DIAGNOSIS — G4730 Sleep apnea, unspecified: Secondary | ICD-10-CM | POA: Diagnosis not present

## 2021-08-10 DIAGNOSIS — Z7984 Long term (current) use of oral hypoglycemic drugs: Secondary | ICD-10-CM | POA: Diagnosis not present

## 2021-08-10 DIAGNOSIS — E119 Type 2 diabetes mellitus without complications: Secondary | ICD-10-CM | POA: Diagnosis not present

## 2021-08-10 DIAGNOSIS — Z6841 Body Mass Index (BMI) 40.0 and over, adult: Secondary | ICD-10-CM | POA: Diagnosis not present

## 2021-08-10 DIAGNOSIS — I4510 Unspecified right bundle-branch block: Secondary | ICD-10-CM | POA: Diagnosis not present

## 2021-08-10 LAB — BASIC METABOLIC PANEL
Anion gap: 14 (ref 5–15)
BUN: 15 mg/dL (ref 8–23)
CO2: 23 mmol/L (ref 22–32)
Calcium: 10.4 mg/dL — ABNORMAL HIGH (ref 8.9–10.3)
Chloride: 99 mmol/L (ref 98–111)
Creatinine, Ser: 0.91 mg/dL (ref 0.44–1.00)
GFR, Estimated: >60 mL/min (ref >60)
Glucose, Bld: 146 mg/dL — ABNORMAL HIGH (ref 70–99)
Potassium: 4 mmol/L (ref 3.5–5.1)
Sodium: 136 mmol/L (ref 135–145)

## 2021-08-10 MED ORDER — ENSURE PRE-SURGERY PO LIQD: 296.0000 mL | Freq: Once | ORAL | Status: DC

## 2021-08-10 NOTE — Progress Notes (Signed)
Anesthesia consult completed with Dr. Glennon Mac. Pt is ok to proceed with her procedure as scheduled at [facility]

## 2021-08-10 NOTE — Anesthesia Preprocedure Evaluation (Addendum)
Anesthesia Evaluation  Patient identified by MRN, date of birth, ID band Patient awake    Reviewed: Allergy & Precautions, NPO status , Patient's Chart, lab work & pertinent test results  Airway Mallampati: I  TM Distance: >3 FB Neck ROM: Full    Dental  (+) Edentulous Upper, Edentulous Lower, Upper Dentures, Lower Dentures   Pulmonary sleep apnea and Continuous Positive Airway Pressure Ventilation , COPD,  COPD inhaler,           Cardiovascular hypertension,      Neuro/Psych  Headaches, Anxiety Depression    GI/Hepatic (+) Cirrhosis       ,   Endo/Other  diabetes, Insulin Dependent, Oral Hypoglycemic AgentsHypothyroidism Morbid obesity  Renal/GU      Musculoskeletal  (+) Arthritis ,   Abdominal (+) + obese,   Peds  Hematology H/o thrombocytopenia: freq nosebleeds   Anesthesia Other Findings H/o breast cancer  Reproductive/Obstetrics                            Anesthesia Physical Anesthesia Plan  ASA: 3  Anesthesia Plan:    Post-op Pain Management:    Induction:   PONV Risk Score and Plan:   Airway Management Planned:   Additional Equipment:   Intra-op Plan:   Post-operative Plan:   Informed Consent:   Plan Discussed with:   Anesthesia Plan Comments:         Anesthesia Quick Evaluation

## 2021-08-10 NOTE — Progress Notes (Signed)
Surgical soap given with instructions, pt verbalized understanding. Enhanced Recovery after Surgery  ?Enhanced Recovery after Surgery is a protocol used to improve the stress on your body and your recovery after surgery. ? ?Patient Instructions ? ?The night before surgery:  ?No food after midnight. ONLY clear liquids after midnight ? ?The day of surgery (if you do NOT have diabetes):  ?Drink ONE (1) Pre-Surgery Clear Ensure as directed.   ?This drink was given to you during your hospital  ?pre-op appointment visit. ?The pre-op nurse will instruct you on the time to drink the  ?Pre-Surgery Ensure depending on your surgery time. ?Finish the drink at the designated time by the pre-op nurse.  ?Nothing else to drink after completing the  ?Pre-Surgery Clear Ensure. ? ?The day of surgery (if you have diabetes): ?Drink ONE (1) Gatorade 2 (G2) as directed. ?This drink was given to you during your hospital  ?pre-op appointment visit.  ?The pre-op nurse will instruct you on the time to drink the  ? Gatorade 2 (G2) depending on your surgery time. ?Color of the Gatorade Buren vary. Red is not allowed. ?Nothing else to drink after completing the  ?Gatorade 2 (G2). ? ?       If office.you have questions, please contact your surgeon?s office  ?

## 2021-08-11 ENCOUNTER — Ambulatory Visit
Admission: RE | Admit: 2021-08-11 | Discharge: 2021-08-11 | Disposition: A | Discharge status: Home / Self Care | Payer: Medicare Other | Source: Ambulatory Visit | Attending: Surgery | Admitting: Surgery

## 2021-08-11 ENCOUNTER — Encounter: Payer: Self-pay | Admitting: Hematology and Oncology

## 2021-08-11 DIAGNOSIS — Z853 Personal history of malignant neoplasm of breast: Secondary | ICD-10-CM

## 2021-08-11 DIAGNOSIS — C50911 Malignant neoplasm of unspecified site of right female breast: Secondary | ICD-10-CM | POA: Diagnosis not present

## 2021-08-11 NOTE — H&P (Signed)
 REFERRING PHYSICIAN: Patel, Rutwik, MD  PROVIDER: DOUGLAS ALLEN BLACKMAN, MD  MRN: [id number] DOB: 10/14/1950  Subjective   Chief Complaint: Newly diagnosed right breast cancer   History of Present Illness: [name] [name] is a 71 y.o. female who is seen as an office consultation for evaluation of a newly diagnosed right breast cancer. She has a previous history of left breast cancer and had a lumpectomy, sentinel node biopsy, and radiation therapy in 2017. She was recently found to have a small distortion in her right breast on screening mammography. She underwent an ultrasound showing a 0.5 cm mass at the 10 o'clock position approximately 9 cm from the nipple. Ultrasound the axilla was unremarkable. She underwent a biopsy of the mass showing an invasive ductal carcinoma which was ER/PR positive, HER2 negative, and had a Ki-67 of 2%. She denies nipple discharge. She is otherwise without complaints. .    Review of Systems: A complete review of systems was obtained from the patient. I have reviewed this information and discussed as appropriate with the patient. See HPI as well for other ROS.  ROS   Medical History: Past Medical History:  Diagnosis Date  Anxiety  Arthritis  Diabetes mellitus without complication (CMS-HCC)  History of cancer  Sleep apnea   There is no problem list on file for this patient.  Past Surgical History:  Procedure Laterality Date  JOINT REPLACEMENT    Allergies  Allergen Reactions  Other Anaphylaxis  Does not know what this allergy agent could be; but denied she had any issues with Latex  Codeine Swelling  Hydrocodone-Acetaminophen Other (See Comments)  Lisinopril Other (See Comments)  Metformin Other (See Comments)  Pregabalin Other (See Comments)   Current Outpatient Medications on File Prior to Visit  Medication Sig Dispense Refill  acetaminophen (TYLENOL) 500 MG tablet Take by mouth every 6 (six) hours  acetaminophen-codeine (TYLENOL #3)  300-30 mg tablet Take by mouth  albuterol 90 mcg/actuation inhaler 1 puff as needed  anastrozole (ARIMIDEX) 1 mg tablet 1 tablet  BASAGLAR KWIKPEN U-100 INSULIN pen injector (concentration 100 units/mL) as directed  benzonatate (TESSALON) 100 MG capsule Take by mouth  buPROPion (WELLBUTRIN XL) 150 MG XL tablet Take 1 tablet by mouth every morning  citalopram (CELEXA) 40 MG tablet TAKE 1 TABLET ONCE DAILY FOR ANXIETY  FIASP FLEXTOUCH U-100 INSULIN 100 unit/mL (3 mL) InPn Inject subcutaneously  gabapentin (NEURONTIN) 600 MG tablet TAKE 1 TABLET IN THE MORNING AND 1 TABLET IN THE AFTERNOON AND 2 TABS AT BEDTIME  levothyroxine (SYNTHROID) 150 MCG tablet Take by mouth  losartan (COZAAR) 25 MG tablet Take 1 tablet by mouth once daily  mycophenolate (CELLCEPT) 500 mg tablet Take by mouth  OMNIPOD DASH PDM KIT, GEN 4, Misc 1 DEVICE BY DOES NOT APPLY ROUTE EVERY 3 (THREE) DAYS.  VICTOZA 2-PAK 0.6 mg/0.1 mL (18 mg/3 mL) pen injector 1.8 ml  ascorbic acid, vitamin C, (VITAMIN C) 250 MG tablet Take by mouth  cyanocobalamin (VITAMIN B12) 250 MCG tablet Take by mouth  FARXIGA 10 mg tablet TAKE 1 TABLET BY MOUTH DAILY BEFORE BREAKFAST.  methocarbamoL (ROBAXIN) 500 MG tablet 1 tablet  multivitamin with minerals tablet Take 1 tablet by mouth once daily  pyridoxine, vitamin B6, (B-6) 100 MG tablet Take 100 mg by mouth once daily  sour cherry extract (TART CHERRY EXTRACT ORAL) 1 capsule  TURMERIC ORAL Take by mouth once daily   No current facility-administered medications on file prior to visit.   Family History  Problem   Relation Age of Onset  High blood pressure (Hypertension) Mother  Obesity Father  Diabetes Father  Obesity Sister  Obesity Brother    Social History   Tobacco Use  Smoking Status Never  Smokeless Tobacco Never    Social History   Socioeconomic History  Marital status: Married  Tobacco Use  Smoking status: Never  Smokeless tobacco: Never  Vaping Use  Vaping Use: Never  used  Substance and Sexual Activity  Alcohol use: Defer  Drug use: Defer   Objective:   Vitals:   BP: (!) 152/96  Pulse: 101  Temp: 36.7 C (98.1 F)  SpO2: 96%  Weight: (!) 122.5 kg (270 lb)  Height: 154.9 cm (5' 1")   Body mass index is 51.02 kg/m.  Physical Exam   She appears well on exam  There are no palpable breast masses and no axillary adenopathy on either side. The nipple areolar complexes are normal.  Labs, Imaging and Diagnostic Testing: I reviewed her mammograms, ultrasound, and pathology results as well as her previous history  Assessment and Plan:   Diagnoses and all orders for this visit:  Invasive ductal carcinoma of breast, female, right (CMS-HCC)    I discussed the diagnosis with the patient and her husband. We discussed breast conservation versus mastectomy. She has had a prior lumpectomy on her other breast and so wants to proceed with breast conservation on the right side. I next discussed proceeding with a radioactive seed guided right breast lumpectomy. I explained the surgical procedure in detail. We discussed the risks which includes but is not limited to bleeding, infection, need for further surgery if margins are positive, injury to surrounding structures, cardiopulmonary issues, postoperative recovery, etc. She will also be seeing medical and radiation oncology. They understand and agree with the plans.

## 2021-08-12 ENCOUNTER — Ambulatory Visit
Admission: RE | Admit: 2021-08-12 | Discharge: 2021-08-12 | Disposition: A | Discharge status: Home / Self Care | Payer: Medicare Other | Source: Ambulatory Visit | Attending: Surgery | Admitting: Surgery

## 2021-08-12 ENCOUNTER — Encounter (HOSPITAL_BASED_OUTPATIENT_CLINIC_OR_DEPARTMENT_OTHER): Payer: Self-pay | Admitting: Surgery

## 2021-08-12 ENCOUNTER — Other Ambulatory Visit: Payer: Self-pay

## 2021-08-12 ENCOUNTER — Ambulatory Visit (HOSPITAL_BASED_OUTPATIENT_CLINIC_OR_DEPARTMENT_OTHER): Payer: Medicare Other | Admitting: Anesthesiology

## 2021-08-12 ENCOUNTER — Encounter (HOSPITAL_BASED_OUTPATIENT_CLINIC_OR_DEPARTMENT_OTHER)
Admission: RE | Disposition: A | Discharge status: Home / Self Care | Payer: Self-pay | Source: Home / Self Care | Attending: Surgery

## 2021-08-12 ENCOUNTER — Ambulatory Visit (HOSPITAL_BASED_OUTPATIENT_CLINIC_OR_DEPARTMENT_OTHER)
Admission: RE | Admit: 2021-08-12 | Discharge: 2021-08-12 | Disposition: A | Discharge status: Home / Self Care | Payer: Medicare Other | Attending: Surgery | Admitting: Surgery

## 2021-08-12 DIAGNOSIS — E1142 Type 2 diabetes mellitus with diabetic polyneuropathy: Secondary | ICD-10-CM

## 2021-08-12 DIAGNOSIS — G4730 Sleep apnea, unspecified: Secondary | ICD-10-CM | POA: Insufficient documentation

## 2021-08-12 DIAGNOSIS — Z853 Personal history of malignant neoplasm of breast: Secondary | ICD-10-CM

## 2021-08-12 DIAGNOSIS — C50411 Malignant neoplasm of upper-outer quadrant of right female breast: Secondary | ICD-10-CM | POA: Insufficient documentation

## 2021-08-12 DIAGNOSIS — I4510 Unspecified right bundle-branch block: Secondary | ICD-10-CM | POA: Insufficient documentation

## 2021-08-12 DIAGNOSIS — Z9989 Dependence on other enabling machines and devices: Secondary | ICD-10-CM | POA: Diagnosis not present

## 2021-08-12 DIAGNOSIS — G4733 Obstructive sleep apnea (adult) (pediatric): Secondary | ICD-10-CM

## 2021-08-12 DIAGNOSIS — F32A Depression, unspecified: Secondary | ICD-10-CM | POA: Insufficient documentation

## 2021-08-12 DIAGNOSIS — Z6841 Body Mass Index (BMI) 40.0 and over, adult: Secondary | ICD-10-CM | POA: Insufficient documentation

## 2021-08-12 DIAGNOSIS — C50911 Malignant neoplasm of unspecified site of right female breast: Secondary | ICD-10-CM

## 2021-08-12 DIAGNOSIS — J449 Chronic obstructive pulmonary disease, unspecified: Secondary | ICD-10-CM | POA: Diagnosis not present

## 2021-08-12 DIAGNOSIS — F419 Anxiety disorder, unspecified: Secondary | ICD-10-CM | POA: Diagnosis not present

## 2021-08-12 DIAGNOSIS — F5101 Primary insomnia: Secondary | ICD-10-CM

## 2021-08-12 DIAGNOSIS — I10 Essential (primary) hypertension: Secondary | ICD-10-CM | POA: Insufficient documentation

## 2021-08-12 DIAGNOSIS — R928 Other abnormal and inconclusive findings on diagnostic imaging of breast: Secondary | ICD-10-CM | POA: Diagnosis not present

## 2021-08-12 DIAGNOSIS — E039 Hypothyroidism, unspecified: Secondary | ICD-10-CM | POA: Insufficient documentation

## 2021-08-12 DIAGNOSIS — Z79899 Other long term (current) drug therapy: Secondary | ICD-10-CM | POA: Diagnosis not present

## 2021-08-12 DIAGNOSIS — Z794 Long term (current) use of insulin: Secondary | ICD-10-CM | POA: Insufficient documentation

## 2021-08-12 DIAGNOSIS — R040 Epistaxis: Secondary | ICD-10-CM

## 2021-08-12 DIAGNOSIS — Z170 Estrogen receptor positive status [ER+]: Secondary | ICD-10-CM | POA: Diagnosis not present

## 2021-08-12 DIAGNOSIS — E119 Type 2 diabetes mellitus without complications: Secondary | ICD-10-CM | POA: Insufficient documentation

## 2021-08-12 DIAGNOSIS — Z7984 Long term (current) use of oral hypoglycemic drugs: Secondary | ICD-10-CM | POA: Insufficient documentation

## 2021-08-12 HISTORY — PX: BREAST LUMPECTOMY WITH RADIOACTIVE SEED LOCALIZATION: SHX6424

## 2021-08-12 HISTORY — DX: Dyspnea, unspecified: R06.00

## 2021-08-12 HISTORY — DX: Hypothyroidism, unspecified: E03.9

## 2021-08-12 LAB — GLUCOSE, CAPILLARY
Glucose-Capillary: 141 mg/dL — ABNORMAL HIGH (ref 70–99)
Glucose-Capillary: 141 mg/dL — ABNORMAL HIGH (ref 70–99)

## 2021-08-12 LAB — CBC
HCT: 39.6 % (ref 36.0–46.0)
Hemoglobin: 12.7 g/dL (ref 12.0–15.0)
MCH: 28.5 pg (ref 26.0–34.0)
MCHC: 32.1 g/dL (ref 30.0–36.0)
MCV: 88.8 fL (ref 80.0–100.0)
Platelets: 80 10*3/uL — ABNORMAL LOW (ref 150–400)
RBC: 4.46 MIL/uL (ref 3.87–5.11)
RDW: 14.8 % (ref 11.5–15.5)
WBC: 3.7 10*3/uL — ABNORMAL LOW (ref 4.0–10.5)
nRBC: 0 % (ref 0.0–0.2)

## 2021-08-12 SURGERY — BREAST LUMPECTOMY WITH RADIOACTIVE SEED LOCALIZATION
Anesthesia: General | Site: Breast | Laterality: Right

## 2021-08-12 MED ORDER — LACTATED RINGERS IV SOLN: INTRAVENOUS | Status: DC

## 2021-08-12 MED ORDER — DEXAMETHASONE SODIUM PHOSPHATE 10 MG/ML IJ SOLN
INTRAMUSCULAR | Status: AC
  Filled 2021-08-12: qty 1

## 2021-08-12 MED ORDER — PHENYLEPHRINE HCL (PRESSORS) 10 MG/ML IV SOLN
INTRAVENOUS | Status: DC | PRN
  Administered 2021-08-12 (×6): 80 ug via INTRAVENOUS

## 2021-08-12 MED ORDER — FENTANYL CITRATE (PF) 100 MCG/2ML IJ SOLN
INTRAMUSCULAR | Status: AC
  Filled 2021-08-12: qty 2

## 2021-08-12 MED ORDER — LIDOCAINE 2% (20 MG/ML) 5 ML SYRINGE
INTRAMUSCULAR | Status: AC
Start: 2021-08-12 — End: ?
  Filled 2021-08-12: qty 5

## 2021-08-12 MED ORDER — PROPOFOL 10 MG/ML IV BOLUS
INTRAVENOUS | Status: DC | PRN
  Administered 2021-08-12: 120 mg via INTRAVENOUS

## 2021-08-12 MED ORDER — BUPIVACAINE-EPINEPHRINE 0.5% -1:200000 IJ SOLN
INTRAMUSCULAR | Status: DC | PRN
  Administered 2021-08-12: 20 mL

## 2021-08-12 MED ORDER — FENTANYL CITRATE (PF) 100 MCG/2ML IJ SOLN
INTRAMUSCULAR | Status: DC | PRN
Start: 2021-08-12 — End: 2021-08-12
  Administered 2021-08-12 (×2): 25 ug via INTRAVENOUS

## 2021-08-12 MED ORDER — ACETAMINOPHEN 500 MG PO TABS
ORAL_TABLET | ORAL | Status: AC
  Filled 2021-08-12: qty 2

## 2021-08-12 MED ORDER — EPHEDRINE 5 MG/ML INJ
INTRAVENOUS | Status: AC
  Filled 2021-08-12: qty 5

## 2021-08-12 MED ORDER — BUPIVACAINE-EPINEPHRINE (PF) 0.5% -1:200000 IJ SOLN
INTRAMUSCULAR | Status: AC
  Filled 2021-08-12: qty 30

## 2021-08-12 MED ORDER — CEFAZOLIN SODIUM-DEXTROSE 2-4 GM/100ML-% IV SOLN
INTRAVENOUS | Status: AC
  Filled 2021-08-12: qty 100

## 2021-08-12 MED ORDER — MIDAZOLAM HCL 2 MG/2ML IJ SOLN
INTRAMUSCULAR | Status: AC
  Filled 2021-08-12: qty 2

## 2021-08-12 MED ORDER — CHLORHEXIDINE GLUCONATE CLOTH 2 % EX PADS: 6.0000 | MEDICATED_PAD | Freq: Once | CUTANEOUS | Status: DC

## 2021-08-12 MED ORDER — PROPOFOL 10 MG/ML IV BOLUS
INTRAVENOUS | Status: AC
  Filled 2021-08-12: qty 20

## 2021-08-12 MED ORDER — AMISULPRIDE (ANTIEMETIC) 5 MG/2ML IV SOLN: 10.0000 mg | Freq: Once | INTRAVENOUS | Status: DC | PRN

## 2021-08-12 MED ORDER — DEXAMETHASONE SODIUM PHOSPHATE 4 MG/ML IJ SOLN
INTRAMUSCULAR | Status: DC | PRN
  Administered 2021-08-12: 5 mg via INTRAVENOUS

## 2021-08-12 MED ORDER — TRAMADOL HCL 50 MG PO TABS: 50.0000 mg | ORAL_TABLET | Freq: Four times a day (QID) | ORAL | 0 refills | Status: DC | PRN

## 2021-08-12 MED ORDER — LIDOCAINE HCL (CARDIAC) PF 100 MG/5ML IV SOSY
PREFILLED_SYRINGE | INTRAVENOUS | Status: DC | PRN
  Administered 2021-08-12: 60 mg via INTRAVENOUS

## 2021-08-12 MED ORDER — EPHEDRINE SULFATE (PRESSORS) 50 MG/ML IJ SOLN
INTRAMUSCULAR | Status: DC | PRN
  Administered 2021-08-12: 15 mg via INTRAVENOUS
  Administered 2021-08-12: 10 mg via INTRAVENOUS

## 2021-08-12 MED ORDER — ONDANSETRON HCL 4 MG/2ML IJ SOLN
INTRAMUSCULAR | Status: DC | PRN
  Administered 2021-08-12: 4 mg via INTRAVENOUS

## 2021-08-12 MED ORDER — ONDANSETRON HCL 4 MG/2ML IJ SOLN: 4.0000 mg | Freq: Once | INTRAMUSCULAR | Status: DC | PRN

## 2021-08-12 MED ORDER — CEFAZOLIN SODIUM-DEXTROSE 2-4 GM/100ML-% IV SOLN
2.0000 g | INTRAVENOUS | Status: AC
  Administered 2021-08-12: 2 g via INTRAVENOUS

## 2021-08-12 MED ORDER — ACETAMINOPHEN 500 MG PO TABS
1000.0000 mg | ORAL_TABLET | ORAL | Status: AC
  Administered 2021-08-12: 1000 mg via ORAL

## 2021-08-12 MED ORDER — FENTANYL CITRATE (PF) 100 MCG/2ML IJ SOLN: 25.0000 ug | INTRAMUSCULAR | Status: DC | PRN

## 2021-08-12 MED ORDER — ONDANSETRON HCL 4 MG/2ML IJ SOLN
INTRAMUSCULAR | Status: AC
Start: 2021-08-12 — End: ?
  Filled 2021-08-12: qty 2

## 2021-08-12 SURGICAL SUPPLY — 48 items
ADH SKN CLS APL DERMABOND .7 (GAUZE/BANDAGES/DRESSINGS) ×1
APL PRP STRL LF DISP 70% ISPRP (MISCELLANEOUS) ×1
APPLIER CLIP 9.375 MED OPEN (MISCELLANEOUS)
APR CLP MED 9.3 20 MLT OPN (MISCELLANEOUS)
BINDER BREAST 3XL (GAUZE/BANDAGES/DRESSINGS) IMPLANT
BINDER BREAST LRG (GAUZE/BANDAGES/DRESSINGS) IMPLANT
BINDER BREAST MEDIUM (GAUZE/BANDAGES/DRESSINGS) IMPLANT
BINDER BREAST XLRG (GAUZE/BANDAGES/DRESSINGS) IMPLANT
BINDER BREAST XXLRG (GAUZE/BANDAGES/DRESSINGS) IMPLANT
BLADE SURG 15 STRL LF DISP TIS (BLADE) ×1 IMPLANT
BLADE SURG 15 STRL SS (BLADE) ×2
CANISTER SUC SOCK COL 7IN (MISCELLANEOUS) IMPLANT
CANISTER SUCT 1200ML W/VALVE (MISCELLANEOUS) IMPLANT
CHLORAPREP W/TINT 26 (MISCELLANEOUS) ×2 IMPLANT
CLIP APPLIE 9.375 MED OPEN (MISCELLANEOUS) IMPLANT
COVER BACK TABLE 60X90IN (DRAPES) ×2 IMPLANT
COVER MAYO STAND STRL (DRAPES) ×2 IMPLANT
COVER PROBE W GEL 5X96 (DRAPES) ×2 IMPLANT
DERMABOND ADVANCED (GAUZE/BANDAGES/DRESSINGS) ×1
DERMABOND ADVANCED .7 DNX12 (GAUZE/BANDAGES/DRESSINGS) ×1 IMPLANT
DRAPE LAPAROSCOPIC ABDOMINAL (DRAPES) ×2 IMPLANT
DRAPE UTILITY XL STRL (DRAPES) ×2 IMPLANT
ELECT REM PT RETURN 9FT ADLT (ELECTROSURGICAL) ×2
ELECTRODE REM PT RTRN 9FT ADLT (ELECTROSURGICAL) ×1 IMPLANT
GAUZE SPONGE 4X4 12PLY STRL LF (GAUZE/BANDAGES/DRESSINGS) IMPLANT
GLOVE SURG SIGNA 7.5 PF LTX (GLOVE) ×2 IMPLANT
GOWN STRL REUS W/ TWL LRG LVL3 (GOWN DISPOSABLE) ×1 IMPLANT
GOWN STRL REUS W/ TWL XL LVL3 (GOWN DISPOSABLE) ×1 IMPLANT
GOWN STRL REUS W/TWL LRG LVL3 (GOWN DISPOSABLE) ×2
GOWN STRL REUS W/TWL XL LVL3 (GOWN DISPOSABLE) ×2
KIT MARKER MARGIN INK (KITS) ×2 IMPLANT
NDL HYPO 25X1 1.5 SAFETY (NEEDLE) ×1 IMPLANT
NEEDLE HYPO 25X1 1.5 SAFETY (NEEDLE) ×2 IMPLANT
NS IRRIG 1000ML POUR BTL (IV SOLUTION) IMPLANT
PACK BASIN DAY SURGERY FS (CUSTOM PROCEDURE TRAY) ×2 IMPLANT
PENCIL SMOKE EVACUATOR (MISCELLANEOUS) ×2 IMPLANT
SLEEVE SCD COMPRESS KNEE MED (STOCKING) ×2 IMPLANT
SPIKE FLUID TRANSFER (MISCELLANEOUS) IMPLANT
SPONGE T-LAP 4X18 ~~LOC~~+RFID (SPONGE) ×2 IMPLANT
SUT MNCRL AB 4-0 PS2 18 (SUTURE) ×2 IMPLANT
SUT SILK 2 0 SH (SUTURE) IMPLANT
SUT VIC AB 3-0 SH 27 (SUTURE) ×2
SUT VIC AB 3-0 SH 27X BRD (SUTURE) ×1 IMPLANT
SYR CONTROL 10ML LL (SYRINGE) ×2 IMPLANT
TOWEL GREEN STERILE FF (TOWEL DISPOSABLE) ×2 IMPLANT
TRAY FAXITRON CT DISP (TRAY / TRAY PROCEDURE) ×2 IMPLANT
TUBE CONNECTING 20X1/4 (TUBING) IMPLANT
YANKAUER SUCT BULB TIP NO VENT (SUCTIONS) IMPLANT

## 2021-08-12 NOTE — Discharge Instructions (Addendum)
[place] Office Phone Number [phone]  BREAST BIOPSY/ PARTIAL MASTECTOMY: POST OP INSTRUCTIONS  Always review your discharge instruction sheet given to you by the facility where your surgery was performed.  IF YOU HAVE DISABILITY OR FAMILY LEAVE FORMS, YOU MUST BRING THEM TO THE OFFICE FOR PROCESSING.  DO NOT GIVE THEM TO YOUR DOCTOR.  A prescription for pain medication [name] be given to you upon discharge.  Take your pain medication as prescribed, if needed.  If narcotic pain medicine is not needed, then you Trickett take acetaminophen (Tylenol) or ibuprofen (Advil) as needed. Take your usually prescribed medications unless otherwise directed If you need a refill on your pain medication, please contact your pharmacy.  They will contact our office to request authorization.  Prescriptions will not be filled after 5pm or on week-ends. You should eat very light the first 24 hours after surgery, such as soup, crackers, pudding, etc.  Resume your normal diet the day after surgery. Most patients will experience some swelling and bruising in the breast.  Ice packs and a good support bra will help.  Swelling and bruising can take several days to resolve.  It is common to experience some constipation if taking pain medication after surgery.  Increasing fluid intake and taking a stool softener will usually help or prevent this problem from occurring.  A mild laxative (Milk of Magnesia or Miralax) should be taken according to package directions if there are no bowel movements after 48 hours. Unless discharge instructions indicate otherwise, you Masso remove your bandages 24-48 hours after surgery, and you Eke shower at that time.  You Kovack have steri-strips (small skin tapes) in place directly over the incision.  These strips should be left on the skin for 7-10 days.  If your surgeon used skin glue on the incision, you Welge shower in 24 hours.  The glue will flake off over the next 2-3 weeks.  Any  sutures or staples will be removed at the office during your follow-up visit. ACTIVITIES:  You Bennie resume regular daily activities (gradually increasing) beginning the next day.  Wearing a good support bra or sports bra minimizes pain and swelling.  You Claassen have sexual intercourse when it is comfortable. You Lacivita drive when you no longer are taking prescription pain medication, you can comfortably wear a seatbelt, and you can safely maneuver your car and apply brakes. RETURN TO WORK:  ______________________________________________________________________________________ Dennis Bast should see your doctor in the office for a follow-up appointment approximately two weeks after your surgery.  Your doctor's nurse will typically make your follow-up appointment when she calls you with your pathology report.  Expect your pathology report 2-3 business days after your surgery.  You Buckles call to check if you do not hear from [place] after three days. OTHER INSTRUCTIONS: YOU Reine SHOWER STARTING TOMORROW ICE PACK, TYLENOL, AND IBUPROFEN ALSO FOR PAIN NO VIGOROUS ACTIVITY FOR ONE WEEK _______________________________________________________________________________________________ _____________________________________________________________________________________________________________________________________ _____________________________________________________________________________________________________________________________________ _____________________________________________________________________________________________________________________________________  WHEN TO CALL YOUR DOCTOR: Fever over 101.0 Nausea and/or vomiting. Extreme swelling or bruising. Continued bleeding from incision. Increased pain, redness, or drainage from the incision.  The clinic staff is available to answer your questions during regular business hours.  Please don't hesitate to call and ask to speak to one of the nurses for clinical  concerns.  If you have a medical emergency, go to the nearest emergency room or call 911.  A surgeon from [facility] is always on call at the hospital.  For further questions, please visit  [email or web address]     Post Anesthesia Home Care Instructions  Activity: Get plenty of rest for the remainder of the day. A responsible individual must stay with you for 24 hours following the procedure.  For the next 24 hours, DO NOT: -Drive a car -Paediatric nurse -Drink alcoholic beverages -Take any medication unless instructed by your physician -Make any legal decisions or sign important papers.  Meals: Start with liquid foods such as gelatin or soup. Progress to regular foods as tolerated. Avoid greasy, spicy, heavy foods. If nausea and/or vomiting occur, drink only clear liquids until the nausea and/or vomiting subsides. Call your physician if vomiting continues.  Special Instructions/Symptoms: Your throat Klute feel dry or sore from the anesthesia or the breathing tube placed in your throat during surgery. If this causes discomfort, gargle with warm salt water. The discomfort should disappear within 24 hours.  *Bellard have Tylenol today 1:30pm today 08/12/21

## 2021-08-12 NOTE — Anesthesia Procedure Notes (Signed)
Procedure Name: LMA Insertion Date/Time: 08/12/2021 8:15 AM  Performed by: Bufford Spikes, CRNAPre-anesthesia Checklist: Patient identified, Emergency Drugs available, Suction available and Patient being monitored Patient Re-evaluated:Patient Re-evaluated prior to induction Oxygen Delivery Method: Circle system utilized Preoxygenation: Pre-oxygenation with 100% oxygen Induction Type: IV induction Ventilation: Mask ventilation without difficulty LMA: LMA with gastric port inserted LMA Size: 4.0 Number of attempts: 1 Placement Confirmation: positive ETCO2 Tube secured with: Tape Dental Injury: Teeth and Oropharynx as per pre-operative assessment

## 2021-08-12 NOTE — Transfer of Care (Signed)
Immediate Anesthesia Transfer of Care Note  Patient: [name] [name]  Procedure(s) Performed: RIGHT BREAST LUMPECTOMY WITH RADIOACTIVE SEED LOCALIZATION (Right: Breast)  Patient Location: PACU  Anesthesia Type:General  Level of Consciousness: awake, alert  and oriented  Airway & Oxygen Therapy: Patient Spontanous Breathing and Patient connected to nasal cannula oxygen  Post-op Assessment: Report given to RN and Post -op Vital signs reviewed and stable  Post vital signs: Reviewed and stable  Last Vitals:  Vitals Value Taken Time  BP 116/55 08/12/21 0852  Temp    Pulse 93 08/12/21 0854  Resp 16 08/12/21 0854  SpO2 96 % 08/12/21 0854  Vitals shown include unvalidated device data.  Last Pain:  Vitals:   08/12/21 0719  TempSrc: Oral  PainSc: 0-No pain         Complications: No notable events documented.

## 2021-08-12 NOTE — Op Note (Signed)
RIGHT BREAST LUMPECTOMY WITH RADIOACTIVE SEED LOCALIZATION  Procedure Note  Meghna A Caesar 08/12/2021   Pre-op Diagnosis: RIGHT BREAST CANCER     Post-op Diagnosis: same  Procedure(s): RIGHT BREAST LUMPECTOMY WITH RADIOACTIVE SEED LOCALIZATION  Surgeon(s): Coralie Keens, MD  Anesthesia: General  Staff:  Circulator: Maurene Capes, RN Scrub Person: Romero Liner, CST  Estimated Blood Loss: Minimal               Specimens: sent to path  Indications: This is a 71 year old female who was found to have a small mass in the upper outer quadrant of her right breast.  A biopsy showed an invasive ductal carcinoma.  The decision was made to proceed with a radioactive seed guided right breast lumpectomy  Procedure: The patient was brought to the operating room and identifies the correct patient.  She was placed upon the operating table and general anesthesia was induced.  Her right breast was prepped and draped in usual sterile fashion.  Using the neoprobe the radioactive seed was located in the deep breast tissue of the upper outer quadrant at least 9 cm from the nipple.  I anesthetized the skin in the upper outer quadrant with Marcaine.  I then made a transverse incision with a scalpel.  I then dissected down circumferentially to the breast tissue with electrocautery.  With the aid of the neoprobe I stayed around the radioactive seed.  I went all the way down to the chest wall circumferentially around the signal from the seed and the neoprobe.  I then dissected the lumpectomy specimen off the chest wall with the cautery.  I marked all margins of the lumpectomy specimen with paint.  An x-ray was performed on the specimen confirming that the radioactive seed and previous biopsy clip were in the specimen.  The lumpectomy specimen was then sent to pathology for evaluation.  I achieved hemostasis with the cautery.  I anesthetized the wound further with Marcaine.  I placed surgical clips  around the biopsy cavity for marking purposes.  I then closed some of the deep tissue with a single interrupted 3-0 Vicryl suture.  I then closed the subcutaneous tissue with interrupted 3-0 Vicryl sutures and closed the skin with a running 4-0 Monocryl.  Dermabond was then applied.  The patient tolerated the procedure well.  All the counts were correct at the end of the procedure.  The patient was then extubated in the operating room and taken in a stable condition to the recovery room.          Coralie Keens   Date: 08/12/2021  Time: 8:48 AM

## 2021-08-12 NOTE — Anesthesia Postprocedure Evaluation (Signed)
Anesthesia Post Note  Patient: [name] [name]  Procedure(s) Performed: RIGHT BREAST LUMPECTOMY WITH RADIOACTIVE SEED LOCALIZATION (Right: Breast)     Patient location during evaluation: PACU Anesthesia Type: General Level of consciousness: awake Pain management: pain level controlled Vital Signs Assessment: post-procedure vital signs reviewed and stable Respiratory status: spontaneous breathing, nonlabored ventilation, respiratory function stable and patient connected to nasal cannula oxygen Cardiovascular status: blood pressure returned to baseline and stable Postop Assessment: no apparent nausea or vomiting Anesthetic complications: no   No notable events documented.  Last Vitals:  Vitals:   08/12/21 0924 08/12/21 0943  BP:  123/62  Pulse: 91 92  Resp: (!) 23 (!) 22  Temp:  37.4 C  SpO2: 91% 96%    Last Pain:  Vitals:   08/12/21 0943  TempSrc: Oral  PainSc: 0-No pain                 Kadija Cruzen P Emmakate Hypes

## 2021-08-12 NOTE — Interval H&P Note (Signed)
  History and Physical Interval Note:no change in H and P  08/12/2021 8:00 AM  [name] [name]  has presented today for surgery, with the diagnosis of RIGHT BREAST CANCER.  The various methods of treatment have been discussed with the patient and family. After consideration of risks, benefits and other options for treatment, the patient has consented to  Procedure(s) with comments: RIGHT BREAST LUMPECTOMY WITH RADIOACTIVE SEED LOCALIZATION (Right) - LMA as a surgical intervention.  The patient's history has been reviewed, patient examined, no change in status, stable for surgery.  I have reviewed the patient's chart and labs.  Questions were answered to the patient's satisfaction.     Coralie Keens

## 2021-08-13 ENCOUNTER — Encounter (HOSPITAL_BASED_OUTPATIENT_CLINIC_OR_DEPARTMENT_OTHER): Payer: Self-pay | Admitting: Surgery

## 2021-08-13 ENCOUNTER — Other Ambulatory Visit: Payer: Self-pay | Admitting: Internal Medicine

## 2021-08-17 ENCOUNTER — Telehealth: Payer: Self-pay | Admitting: *Deleted

## 2021-08-17 ENCOUNTER — Encounter: Payer: Self-pay | Admitting: *Deleted

## 2021-08-17 NOTE — Telephone Encounter (Signed)
Ordered oncotype per Dr. Chryl Heck. Sent requisition to [facility] sciences and pathology

## 2021-08-18 ENCOUNTER — Encounter: Payer: Self-pay | Admitting: Hematology and Oncology

## 2021-08-18 ENCOUNTER — Encounter (HOSPITAL_COMMUNITY): Payer: Self-pay

## 2021-08-19 ENCOUNTER — Telehealth: Payer: Self-pay | Admitting: Internal Medicine

## 2021-08-19 NOTE — Telephone Encounter (Signed)
Pt question was answered before message needed to be sent.

## 2021-08-21 DIAGNOSIS — E119 Type 2 diabetes mellitus without complications: Secondary | ICD-10-CM | POA: Diagnosis not present

## 2021-08-21 DIAGNOSIS — Z79899 Other long term (current) drug therapy: Secondary | ICD-10-CM | POA: Diagnosis not present

## 2021-08-21 DIAGNOSIS — L121 Cicatricial pemphigoid: Secondary | ICD-10-CM | POA: Diagnosis not present

## 2021-08-21 DIAGNOSIS — Z961 Presence of intraocular lens: Secondary | ICD-10-CM | POA: Diagnosis not present

## 2021-08-21 DIAGNOSIS — H02056 Trichiasis without entropian left eye, unspecified eyelid: Secondary | ICD-10-CM | POA: Diagnosis not present

## 2021-08-21 DIAGNOSIS — H02053 Trichiasis without entropian right eye, unspecified eyelid: Secondary | ICD-10-CM | POA: Diagnosis not present

## 2021-08-21 DIAGNOSIS — Z794 Long term (current) use of insulin: Secondary | ICD-10-CM | POA: Diagnosis not present

## 2021-08-21 DIAGNOSIS — H35373 Puckering of macula, bilateral: Secondary | ICD-10-CM | POA: Diagnosis not present

## 2021-08-25 ENCOUNTER — Inpatient Hospital Stay: Payer: Medicare Other

## 2021-08-25 ENCOUNTER — Other Ambulatory Visit: Payer: Self-pay

## 2021-08-25 ENCOUNTER — Inpatient Hospital Stay: Payer: Medicare Other | Attending: Hematology and Oncology | Admitting: Hematology and Oncology

## 2021-08-25 VITALS — BP 123/55 | HR 100 | Temp 98.1°F | Resp 18 | Ht 61.0 in | Wt 263.1 lb

## 2021-08-25 DIAGNOSIS — I10 Essential (primary) hypertension: Secondary | ICD-10-CM | POA: Insufficient documentation

## 2021-08-25 DIAGNOSIS — Z79899 Other long term (current) drug therapy: Secondary | ICD-10-CM | POA: Diagnosis not present

## 2021-08-25 DIAGNOSIS — Z13820 Encounter for screening for osteoporosis: Secondary | ICD-10-CM

## 2021-08-25 DIAGNOSIS — C50412 Malignant neoplasm of upper-outer quadrant of left female breast: Secondary | ICD-10-CM | POA: Diagnosis not present

## 2021-08-25 DIAGNOSIS — L121 Cicatricial pemphigoid: Secondary | ICD-10-CM | POA: Diagnosis not present

## 2021-08-25 DIAGNOSIS — E1140 Type 2 diabetes mellitus with diabetic neuropathy, unspecified: Secondary | ICD-10-CM | POA: Insufficient documentation

## 2021-08-25 DIAGNOSIS — Z5111 Encounter for antineoplastic chemotherapy: Secondary | ICD-10-CM | POA: Diagnosis not present

## 2021-08-25 DIAGNOSIS — Z170 Estrogen receptor positive status [ER+]: Secondary | ICD-10-CM | POA: Diagnosis not present

## 2021-08-25 MED ORDER — FULVESTRANT 250 MG/5ML IM SOSY
500.0000 mg | PREFILLED_SYRINGE | Freq: Once | INTRAMUSCULAR | Status: AC
  Administered 2021-08-25: 500 mg via INTRAMUSCULAR
  Filled 2021-08-25: qty 10

## 2021-08-25 MED ORDER — DOXYCYCLINE HYCLATE 100 MG PO TABS: 100.0000 mg | ORAL_TABLET | Freq: Two times a day (BID) | ORAL | 0 refills | Status: DC

## 2021-08-25 NOTE — Patient Instructions (Signed)
Fulvestrant injection What is this medication? FULVESTRANT (ful VES trant) blocks the effects of estrogen. It is used to treat breast cancer. This medicine Ensz be used for other purposes; ask your health care provider or pharmacist if you have questions. COMMON BRAND NAME(S): FASLODEX What should I tell my care team before I take this medication? They need to know if you have any of these conditions: bleeding disorders liver disease low blood counts, like low white cell, platelet, or red cell counts an unusual or allergic reaction to fulvestrant, other medicines, foods, dyes, or preservatives pregnant or trying to get pregnant breast-feeding How should I use this medication? This medicine is for injection into a muscle. It is usually given by a health care professional in a hospital or clinic setting. Talk to your pediatrician regarding the use of this medicine in children. Special care Pusey be needed. Overdosage: If you think you have taken too much of this medicine contact a poison control center or emergency room at once. NOTE: This medicine is only for you. Do not share this medicine with others. What if I miss a dose? It is important not to miss your dose. Call your doctor or health care professional if you are unable to keep an appointment. What Pleitez interact with this medication? medicines that treat or prevent blood clots like warfarin, enoxaparin, dalteparin, apixaban, dabigatran, and rivaroxaban This list Polimeni not describe all possible interactions. Give your health care provider a list of all the medicines, herbs, non-prescription drugs, or dietary supplements you use. Also tell them if you smoke, drink alcohol, or use illegal drugs. Some items Bolser interact with your medicine. What should I watch for while using this medication? Your condition will be monitored carefully while you are receiving this medicine. You will need important blood work done while you are taking this  medicine. Do not become pregnant while taking this medicine or for at least 1 year after stopping it. Women of child-bearing potential will need to have a negative pregnancy test before starting this medicine. Women should inform their doctor if they wish to become pregnant or think they might be pregnant. There is a potential for serious side effects to an unborn child. Men should inform their doctors if they wish to father a child. This medicine Balboa lower sperm counts. Talk to your health care professional or pharmacist for more information. Do not breast-feed an infant while taking this medicine or for 1 year after the last dose. What side effects Goody I notice from receiving this medication? Side effects that you should report to your doctor or health care professional as soon as possible: allergic reactions like skin rash, itching or hives, swelling of the face, lips, or tongue feeling faint or lightheaded, falls pain, tingling, numbness, or weakness in the legs signs and symptoms of infection like fever or chills; cough; flu-like symptoms; sore throat vaginal bleeding Side effects that usually do not require medical attention (report to your doctor or health care professional if they continue or are bothersome): aches, pains constipation diarrhea headache hot flashes nausea, vomiting pain at site where injected stomach pain This list Schleifer not describe all possible side effects. Call your doctor for medical advice about side effects. You Rosevear report side effects to FDA at 1-800-FDA-1088. Where should I keep my medication? This drug is given in a hospital or clinic and will not be stored at home. NOTE: This sheet is a summary. It Laskin not cover all possible information. If you have   questions about this medicine, talk to your doctor, pharmacist, or health care provider.  2023 Elsevier/Gold Standard (2017-05-03 00:00:00)  

## 2021-08-25 NOTE — Progress Notes (Signed)
[name]  [phone] [phone]     ID: [name] [name] DOB: 1950-09-01  MR#: [id number]  AXE#:[id number]  Patient Care Team: [name], MD as PCP - General (Internal Medicine) [name], RN as Oncology Nurse Navigator [name], RN as Oncology Nurse Navigator [name], MD as Consulting Physician (Hematology and Oncology) OTHER MD:  CHIEF COMPLAINT: Estrogen receptor positive invasive breast cancer  CURRENT TREATMENT: anastrozole   COVID 19 VACCINATION STATUS: Not vaccinated as of 12/22/2019   BREAST CANCER HISTORY:  Oncology History  Malignant neoplasm of upper-outer quadrant of left breast in female, estrogen receptor positive ([place])  08/19/2015 Initial Diagnosis   Malignant neoplasm of upper-outer quadrant of left breast in female, estrogen receptor positive ([place])    Mammogram   71 y.o. [name] status post left breast upper outer quadrant lumpectomy 08/04/2015 for a pT1c pNX, stage I A invasive ductal carcinoma, estrogen and progesterone receptor positive, HER-2 not amplified, with no HER-2 amplification, anterior margin was focally positive, additional Left breast surgery 09/12/2015 cleared the margins and found all 5 axillary lymph nodes sampled clear for a final stage pT1c pN0, stage IA  Oncotype DX score of 12 predicts a 10 year risk of recurrence outside the breast of 8% if the patient's only systemic therapy is tamoxifen for 5 years. It also predicts no benefit from therapy. She had adjuvant radiation 10/22/15 - 12/08/15              1) Left breast: 50.4 Gy in 28 fractions              2) Left breast boost: 10 Gy in 5 fractions Started adjuvant anastrozole 02/02/2015    06/10/2021 Mammogram   Mammogram showed suspicious mass in 10:30 o'clock location of the RIGHT breast 9 centimeters from the nipple correlating with distortion seen mammographically. [place] right axilla negative. Pathology showed IDC, ER 90%  positive, strong staining, PR 60% positive, moderate staining. Her 2 negative. Ki 2%. She was taking anastrazole when this abnormality was detected.     Interval History She is here since lumpectomy.  She has noticed some redness around the surgical scar.  She otherwise denies any fevers or chills or purulent drainage.  She had Oncotype testing which showed a score of 12 , nodal status unknown.   Rest of the pertinent 10 point ROS reviewed and negative  PAST MEDICAL HISTORY: Past Medical History:  Diagnosis Date   Abdominal wall contusion 12/09/2011   Achilles tendon contracture, bilateral 06/03/2016   Anxiety    Arthritis    Blood dyscrasia    low platelet count- sees [name] , [name] ( Note in [place] from 07/2014) at [facility]) 2017   Left Breast   Breast cancer ([place]) 07/2021   right breast IDC   Cancer ([place])    left breast   Chest wall contusion 12/09/2011   Cirrhosis of liver ([place])    Cough    Diabetes mellitus without complication ([place])    Diabetic neuropathy ([place])    Diarrhea due to drug 01/29/2021   Dyspnea    Gout    Gout    History of radiation therapy 10/22/15 - 12/08/15   left breast 50.4 Gy, boost to 10 Gy   Hypertension    Hypothyroidism    Migraine    Multifactorial gait disorder 01/12/2013   Neuromuscular disorder (HCC)    diabetic neuropathy   Neuropathy  Personal history of radiation therapy 2017   Left Breast Cancer   Posterior tibial tendon dysfunction (PTTD) of right lower extremity 10/09/2019   Sleep apnea    CPAP nightly   Spleen enlarged    Thyroid disease     PAST SURGICAL HISTORY: Past Surgical History:  Procedure Laterality Date   ANKLE FUSION Right 2022   BREAST BIOPSY Left 08/04/2015   Procedure: LEFT BREAST BIOPSY WITH NEEDLE LOCALIZATION;  Surgeon: [name], MD;  Location: [facility];  Service: General;  Laterality: Left;   BREAST BIOPSY Right 06/23/2021   BREAST  DUCTAL SYSTEM EXCISION Left 08/04/2015   Procedure: LEFT EXCISION DUCTAL SYSTEM BREAST;  Surgeon: [name], MD;  Location: [facility];  Service: General;  Laterality: Left;   BREAST LUMPECTOMY Left 08/2015   BREAST LUMPECTOMY WITH AXILLARY LYMPH NODE BIOPSY Left 09/12/2015   Procedure: RE-EXCISION OF LEFT BREAST LUMPECTOMY WITH LEFT AXILLARY LYMPH NODE BIOPSY;  Surgeon: [name], MD;  Location: [facility];  Service: General;  Laterality: Left;   BREAST LUMPECTOMY WITH RADIOACTIVE SEED LOCALIZATION Right 08/12/2021   Procedure: RIGHT BREAST LUMPECTOMY WITH RADIOACTIVE SEED LOCALIZATION;  Surgeon: Coralie Keens, MD;  Location: [facility];  Service: General;  Laterality: Right;  LMA   CHOLECYSTECTOMY     EYE SURGERY Bilateral 2022   Lashes growing backwards into the eye   JOINT REPLACEMENT     left knee   JOINT REPLACEMENT  2017   right knee   REVERSE SHOULDER ARTHROPLASTY Right 09/07/2018   Procedure: RIGHT REVERSE SHOULDER ARTHROPLASTY;  Surgeon: Meredith Pel, MD;  Location: [facility];  Service: Orthopedics;  Laterality: Right;   TOTAL KNEE ARTHROPLASTY Right 02/28/2015   Procedure: RIGHT TOTAL KNEE ARTHROPLASTY;  Surgeon: [name], MD;  Location: [facility];  Service: Orthopedics;  Laterality: Right;   TUBAL LIGATION      FAMILY HISTORY Family History  Problem Relation Age of Onset   Hypothyroidism Mother    Alzheimer's disease Mother    Diabetes Father    Cancer Sister        GYN cancer   Hypothyroidism Sister    Heart disease Brother    Arthritis Son    Diabetes Son    Arthritis Son    Cancer Maternal Grandmother    Heart failure Maternal Grandfather    Bone cancer Paternal Grandmother    Heart failure Paternal Grandmother   The patient's father died from an encephalitis at the age of 40. The patient's mother is living at age 23. The patient had one brother, 3 sisters. A paternal grandf mother had "bone  cancer". A paternal aunt, present today also had "bone cancer", but on further questioning this was myeloma. The patient's sister [name] was diagnosed with ovarian and uterine cancer and has been genetically tested but the test was negative   GYNECOLOGIC HISTORY:  No LMP recorded. Patient is postmenopausal. Menarche age 46 and first live birth age 2, menopause in her 33s. The patient never took hormone replacement or oral contraceptives.   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 Her husband [name] is a Games developer. At home is just the 2 of them, the patient's aunt who is under hospice care for her myeloma, and the patient's dog. Son [name] lives in [place] and works for an Scientist, research (medical).Pandora Leiter Jeneen Rinks to be also lives in [place] and is a state  roadss Freight forwarder. Daughter Alinda Money lives in [place] and works for the [employer] school system as F Careers information officer. The patient has 5 grandchildren. She attends a [employer].    ADVANCED DIRECTIVES: In the absence of any documentation to the contrary, the patient's spouse is their HCPOA.    HEALTH MAINTENANCE: Social History   Tobacco Use   Smoking status: Never   Smokeless tobacco: Never  Vaping Use   Vaping Use: Never used  Substance Use Topics   Alcohol use: No   Drug use: No     Colonoscopy: 2015/[facility]  PAP:  Bone density:   Allergies  Allergen Reactions   Codeine Swelling   Influenza A (H1n1) Monoval Pf Anaphylaxis   Hydrocodone-Acetaminophen Other (See Comments)   Lisinopril Other (See Comments)   Metformin Other (See Comments)   Pregabalin Other (See Comments)   Jardiance [Empagliflozin] Other (See Comments)    dehydration    Current Outpatient Medications  Medication Sig Dispense Refill   ACCU-CHEK AVIVA PLUS test strip USE AS INSTRUCTED TO CHECK BLOOD SUGAR 2 TIMES DAILY 100 strip 5   acetaminophen  (TYLENOL) 500 MG tablet Take by mouth.     albuterol (VENTOLIN HFA) 108 (90 Base) MCG/ACT inhaler 1 puff as needed     Ascorbic Acid (VITAMIN C PO) Take by mouth daily.     benzonatate (TESSALON) 100 MG capsule Take 1 capsule (100 mg total) by mouth 2 (two) times daily as needed for cough. 20 capsule 0   buPROPion (WELLBUTRIN XL) 150 MG 24 hr tablet Take 1 tablet (150 mg total) by mouth every morning. 90 tablet 1   citalopram (CELEXA) 40 MG tablet Take 1 tablet (40 mg total) by mouth daily. 90 tablet 1   Cyanocobalamin (VITAMIN B-12 PO) Take by mouth daily.     dapagliflozin propanediol (FARXIGA) 10 MG TABS tablet Take 1 tablet (10 mg total) by mouth daily before breakfast. 90 tablet 3   fluticasone (FLONASE) 50 MCG/ACT nasal spray Place 1 spray into both nostrils as needed.      gabapentin (NEURONTIN) 600 MG tablet TAKE 1 TABLET IN THE MORNING AND 1 TABLET IN THE AFTERNOON AND 2TABS AT BEDTIME     insulin aspart (FIASP FLEXTOUCH) 100 UNIT/ML FlexTouch Pen Inject 18 Units into the skin with breakfast, with lunch, and with evening meal. 45 mL 3   Insulin Disposable Pump (OMNIPOD 5 G6 INTRO, GEN 5,) KIT 1 Device by Does not apply route every 3 (three) days. (Patient not taking: Reported on 06/09/2021) 1 kit 0   Insulin Disposable Pump (OMNIPOD 5 G6 POD, GEN 5,) MISC 1 Device by Does not apply route every 3 (three) days. (Patient not taking: Reported on 06/09/2021) 6 each 3   Insulin Glargine (BASAGLAR KWIKPEN) 100 UNIT/ML Inject 60 Units into the skin daily. 60 mL 3   Insulin Pen Needle (B-D UF III MINI PEN NEEDLES) 31G X 5 MM MISC USE AS DIRECTED WITH INSULIN EVERY MORNING, NOON, EVENING AND AT BEDTIME 400 each 3   Insulin Pen Needle 30G X 5 MM MISC 1 Device by Does not apply route in the morning, at noon, in the evening, and at bedtime. 400 each 3   levothyroxine (SYNTHROID) 150 MCG tablet Take 1 tablet (150 mcg total) by mouth daily. (Patient taking differently: Take 137 mcg by mouth daily.) 90 tablet 3    liraglutide (VICTOZA) 18 MG/3ML SOPN Inject 1.8 mg into the skin daily. 27 mL 3   losartan (COZAAR)  25 MG tablet Take 1 tablet (25 mg total) by mouth daily. 90 tablet 1   methocarbamol (ROBAXIN) 500 MG tablet Take 1 tablet (500 mg total) by mouth every 8 (eight) hours as needed for muscle spasms. 30 tablet 0   Misc Natural Products (TART CHERRY ADVANCED) CAPS Take 1 capsule by mouth daily.     Multiple Vitamins-Minerals (MULTIVITAMIN WITH MINERALS) tablet Take 1 tablet by mouth daily.     mycophenolate (CELLCEPT) 500 MG tablet Take 1,500 mg by mouth 2 (two) times daily.     Pyridoxine HCl (VITAMIN B-6 PO) Take by mouth daily.     traMADol (ULTRAM) 50 MG tablet Take 1 tablet (50 mg total) by mouth every 6 (six) hours as needed for moderate pain or severe pain. 20 tablet 0   VITAMIN D PO Take by mouth daily.     No current facility-administered medications for this visit.    OBJECTIVE: White woman examined in a wheelchair  Vitals:   08/25/21 1107  BP: (!) 123/55  Pulse: 100  Resp: 18  Temp: 98.1 F (36.7 C)  SpO2: 96%    Wt Readings from Last 3 Encounters:  08/25/21 263 lb 1.6 oz (119.3 kg)  08/12/21 262 lb 2 oz (118.9 kg)  07/20/21 268 lb 6 oz (121.7 kg)   Body mass index is 49.71 kg/m.    ECOG FS:2 - Symptomatic, <50% confined to bed  Physical Exam Constitutional:      Appearance: Normal appearance.     Comments:    Cardiovascular:     Rate and Rhythm: Normal rate and regular rhythm.     Pulses: Normal pulses.     Heart sounds: Normal heart sounds.  Chest:     Comments: There is some erythema around the right breast surgical scar no evidence of purulence Musculoskeletal:     Cervical back: Normal range of motion and neck supple. No rigidity.  Lymphadenopathy:     Cervical: No cervical adenopathy.  Neurological:     Mental Status: She is alert.      LAB RESULTS:  CMP     Component Value Date/Time   NA 136 08/10/2021 0956   NA 138 07/05/2018 1635   NA  142 10/06/2016 1211   K 4.0 08/10/2021 0956   K 3.8 10/06/2016 1211   CL 99 08/10/2021 0956   CO2 23 08/10/2021 0956   CO2 25 10/06/2016 1211   GLUCOSE 146 (H) 08/10/2021 0956   GLUCOSE 179 (H) 10/06/2016 1211   BUN 15 08/10/2021 0956   BUN 19 07/05/2018 1635   BUN 16.9 10/06/2016 1211   CREATININE 0.91 08/10/2021 0956   CREATININE 1.19 (H) 12/25/2019 1315   CREATININE 1.09 (H) 09/28/2017 1516   CREATININE 1.1 10/06/2016 1211   CALCIUM 10.4 (H) 08/10/2021 0956   CALCIUM 10.4 10/06/2016 1211   PROT 7.5 12/25/2019 1315   PROT 7.5 07/05/2018 1635   PROT 7.6 10/06/2016 1211   ALBUMIN 3.5 12/25/2019 1315   ALBUMIN 4.6 07/05/2018 1635   ALBUMIN 3.9 10/06/2016 1211   AST 33 12/25/2019 1315   AST 33 10/06/2016 1211   ALT 18 12/25/2019 1315   ALT 20 10/06/2016 1211   ALKPHOS 118 12/25/2019 1315   ALKPHOS 86 10/06/2016 1211   BILITOT 0.4 12/25/2019 1315   BILITOT 0.52 10/06/2016 1211   GFRNONAA >60 08/10/2021 0956   GFRNONAA 49 (L) 12/25/2019 1315   GFRNONAA 52 (L) 09/28/2017 1516   GFRAA >60 11/07/2018 1133   GFRAA 61  09/28/2017 1516    INo results found for: "SPEP", "UPEP"  Lab Results  Component Value Date   WBC 3.7 (L) 08/12/2021   NEUTROABS 2.4 02/18/2021   HGB 12.7 08/12/2021   HCT 39.6 08/12/2021   MCV 88.8 08/12/2021   PLT 80 (L) 08/12/2021      Chemistry      Component Value Date/Time   NA 136 08/10/2021 0956   NA 138 07/05/2018 1635   NA 142 10/06/2016 1211   K 4.0 08/10/2021 0956   K 3.8 10/06/2016 1211   CL 99 08/10/2021 0956   CO2 23 08/10/2021 0956   CO2 25 10/06/2016 1211   BUN 15 08/10/2021 0956   BUN 19 07/05/2018 1635   BUN 16.9 10/06/2016 1211   CREATININE 0.91 08/10/2021 0956   CREATININE 1.19 (H) 12/25/2019 1315   CREATININE 1.09 (H) 09/28/2017 1516   CREATININE 1.1 10/06/2016 1211      Component Value Date/Time   CALCIUM 10.4 (H) 08/10/2021 0956   CALCIUM 10.4 10/06/2016 1211   ALKPHOS 118 12/25/2019 1315   ALKPHOS 86 10/06/2016  1211   AST 33 12/25/2019 1315   AST 33 10/06/2016 1211   ALT 18 12/25/2019 1315   ALT 20 10/06/2016 1211   BILITOT 0.4 12/25/2019 1315   BILITOT 0.52 10/06/2016 1211       No results found for: "LABCA2"  No components found for: "LABCA125"  No results for input(s): "INR" in the last 168 hours.  Urinalysis    Component Value Date/Time   COLORURINE YELLOW 08/30/2018 1357   APPEARANCEUR CLEAR 08/30/2018 1357   LABSPEC 1.024 08/30/2018 1357   PHURINE 5.0 08/30/2018 1357   GLUCOSEU >=500 (A) 08/30/2018 1357   HGBUR NEGATIVE 08/30/2018 1357   BILIRUBINUR NEGATIVE 08/30/2018 1357   KETONESUR NEGATIVE 08/30/2018 1357   PROTEINUR NEGATIVE 08/30/2018 1357   UROBILINOGEN 0.2 12/09/2011 1907   NITRITE NEGATIVE 08/30/2018 1357   LEUKOCYTESUR NEGATIVE 08/30/2018 1357    STUDIES: MM Breast Surgical Specimen  Result Date: 08/12/2021 CLINICAL DATA:  Specimen radiograph status post right breast lumpectomy. EXAM: SPECIMEN RADIOGRAPH OF THE RIGHT BREAST COMPARISON:  Previous exam(s). FINDINGS: Status post excision of the right breast. The radioactive seed and biopsy marker clip are present and completely intact within the specimen. These findings were communicated with the OR at 8:39 a.m. IMPRESSION: Specimen radiograph of the right breast. Electronically Signed   By: Ammie Ferrier M.D.   On: 08/12/2021 08:39  MM RT RADIOACTIVE SEED LOC MAMMO GUIDE  Result Date: 08/11/2021 CLINICAL DATA:  Localization of right breast cancer prior to surgery. EXAM: MAMMOGRAPHIC GUIDED RADIOACTIVE SEED LOCALIZATION OF THE RIGHT BREAST COMPARISON:  Previous exam(s). FINDINGS: Patient presents for radioactive seed localization prior to surgery. I met with the patient and we discussed the procedure of seed localization including benefits and alternatives. We discussed the high likelihood of a successful procedure. We discussed the risks of the procedure including infection, bleeding, tissue injury and further  surgery. We discussed the low dose of radioactivity involved in the procedure. Informed, written consent was given. The usual time-out protocol was performed immediately prior to the procedure. Using mammographic guidance, sterile technique, 1% lidocaine and an I-125 radioactive seed, the known breast cancer was localized using a lateral approach. The follow-up mammogram images confirm the seed in the expected location and were marked for the surgeon. Follow-up survey of the patient confirms presence of the radioactive seed. Order number of I-125 seed:  [id number]. Total activity:  4.259 millicuries  reference Date: [place] 10, 2023 The patient tolerated the procedure well and was released from the [facility]. She was given instructions regarding seed removal. IMPRESSION: Radioactive seed localization right breast. No apparent complications. Electronically Signed   By: Dorise Bullion III M.D.   On: 08/11/2021 11:06    ELIGIBLE FOR AVAILABLE RESEARCH PROTOCOL: no  ASSESSMENT: 71 y.o. [name] status post left breast upper outer quadrant lumpectomy 08/04/2015 for a pT1c pNX, stage I A invasive ductal carcinoma, estrogen and progesterone receptor positive, HER-2 not amplified, with no HER-2 amplification.  (a) anterior margin was focally positive  (1) additional Left breast surgery 09/12/2015 cleared the margins and found all 5 axillary lymph nodes sampled clear for a final stage pT1c pN0, stage IA  (2) Oncotype DX score of 12 predicts a 10 year risk of recurrence outside the breast of 8% if the patient's only systemic therapy is tamoxifen for 5 years. It also predicts no benefit from therapy.   (3) Adjuvant radiation 10/22/15 - 12/08/15   1) Left breast: 50.4 Gy in 28 fractions              2) Left breast boost: 10 Gy in 5 fractions  (4) started  anastrozole 02/02/2015, completed 6 yrs.  (5) She has mammogram recently 06/20/2021, possible asymmetry within the OUTER RIGHT breast on CC views  warrants further evaluation. Targeted ultrasound is performed, showing hypoechoic oval mass with indistinct margins in the 10:30 o'clock location of the RIGHT breast 9 centimeters from nipple measuring 0.5 x 0.4 x 0.4 centimeters. This area is approximately 1.8 centimeters from the palpable bruise, bruise was thought to be fat necrosis.  Right breast lumpectomy showed invasive ductal carcinoma with clear margins of resection, final pathology showed tumor measuring 1.1 cm x 0.7 x 8.7 grade 3 previous biopsy showed ER 90% positive, PR 60% positive, Ki-67 was 2% HER2 2+ by IHC and negative by FISH  PLAN:  Patient is status post right breast lumpectomy which showed invasive ductal carcinoma, grade 3, ER/PR positive, low proliferation index and she is here to review Oncotype results.  Oncotype score resulted at 12 hence there Zafar not be significant benefit with addition of chemotherapy despite the nodal status. We have discussed about continuing Faslodex for adjuvant endocrine therapy.  Have sent an in basket message to Dr. Randa Ngo about role of adjuvant radiation.  I would like to consider abemaciclib in addition to Faslodex since she is this is her second cancer and she is at high risk for recurrence.  She is agreeable to CDK 4 6 inhibitors she will return to clinic in about 4 weeks.  We will plan to add them after she completes adjuvant radiation. RTC in 4 weeks. I gave her prescription for doxycycline for some cellulitis over the skin next to the surgical incision.  She also has a follow-up with surgery team this Friday,  Total time spent: 30 minutes, she is a new patient to me, transitioning from Dr. Jana Hakim upon his retirement. We have reviewed the history as mentioned above.   *Total Encounter Time as defined by the Centers for Medicare and Medicaid Services includes, in addition to the face-to-face time of a patient visit (documented in the note above) non-face-to-face time: obtaining and reviewing  outside history, ordering and reviewing medications, tests or procedures, care coordination (communications with other health care professionals or caregivers) and documentation in the medical record.

## 2021-08-26 DIAGNOSIS — L121 Cicatricial pemphigoid: Secondary | ICD-10-CM | POA: Diagnosis not present

## 2021-08-26 DIAGNOSIS — H11233 Symblepharon, bilateral: Secondary | ICD-10-CM | POA: Diagnosis not present

## 2021-08-26 DIAGNOSIS — H02012 Cicatricial entropion of right lower eyelid: Secondary | ICD-10-CM | POA: Diagnosis not present

## 2021-08-26 DIAGNOSIS — H16213 Exposure keratoconjunctivitis, bilateral: Secondary | ICD-10-CM | POA: Diagnosis not present

## 2021-08-26 DIAGNOSIS — H02015 Cicatricial entropion of left lower eyelid: Secondary | ICD-10-CM | POA: Diagnosis not present

## 2021-08-27 ENCOUNTER — Telehealth: Payer: Self-pay

## 2021-08-27 ENCOUNTER — Ambulatory Visit: Payer: Self-pay

## 2021-08-27 NOTE — Patient Outreach (Signed)
  Care Management   Outreach Note  08/27/2021 Name: [name] [name] MRN: [id number] DOB: [birth year]  An unsuccessful telephone outreach was attempted today. The patient was referred to the case management team for assistance with care management and care coordination.   Follow Up Plan:  A HIPAA compliant voice message was left today requesting a return call.  [facility] [phone]

## 2021-08-28 DIAGNOSIS — F419 Anxiety disorder, unspecified: Secondary | ICD-10-CM | POA: Insufficient documentation

## 2021-08-28 DIAGNOSIS — F411 Generalized anxiety disorder: Secondary | ICD-10-CM | POA: Insufficient documentation

## 2021-08-28 DIAGNOSIS — M5090 Cervical disc disorder, unspecified, unspecified cervical region: Secondary | ICD-10-CM | POA: Insufficient documentation

## 2021-08-28 DIAGNOSIS — Z800 Family history of malignant neoplasm of digestive organs: Secondary | ICD-10-CM | POA: Insufficient documentation

## 2021-08-28 DIAGNOSIS — Z170 Estrogen receptor positive status [ER+]: Secondary | ICD-10-CM | POA: Diagnosis not present

## 2021-08-28 DIAGNOSIS — E7800 Pure hypercholesterolemia, unspecified: Secondary | ICD-10-CM | POA: Insufficient documentation

## 2021-08-28 DIAGNOSIS — C50911 Malignant neoplasm of unspecified site of right female breast: Secondary | ICD-10-CM | POA: Diagnosis not present

## 2021-08-31 ENCOUNTER — Encounter (HOSPITAL_COMMUNITY): Payer: Self-pay

## 2021-09-01 LAB — SURGICAL PATHOLOGY

## 2021-09-04 NOTE — Progress Notes (Signed)
Location of Breast Cancer: outer right breast  Histology per Pathology Report:   A. BREAST, RIGHT, LUMPECTOMY:  Invasive ductal carcinoma with clear margins of resection.   Receptor Status: ER(90%), PR (60%), Her2-neu (negative), Ki-(2%)  Did patient present with symptoms (if so, please note symptoms) or was this found on screening mammography?: mammogram  Past/Anticipated interventions by surgeon, if VOH:KGOVP BREAST LUMPECTOMY WITH RADIOACTIVE SEED LOCALIZATION 08/12/2021 by Dr. Ninfa Linden  Past/Anticipated interventions by medical oncology, if any: abemaciclib in addition to Faslodex after radiation  Lymphedema issues, if any:  no    Pain issues, if any:  no   SAFETY ISSUES: Prior radiation? 10/22/15 - 12/08/15 Left breast: 50.4 Gy in 28 fractions and Left breast boost: 10 Gy in 5 fractions Pacemaker/ICD? no Possible current pregnancy?no Is the patient on methotrexate? no  Current Complaints / other details:  None noted.  BP 134/61 (BP Location: Right Arm, Patient Position: Sitting, Cuff Size: Large)   Pulse 70   Temp 97.9 F (36.6 C)   Resp 20   Ht _0  (1.549 m)   Wt 263 lb 3.2 oz (119.4 kg)   SpO2 96%   BMI 49.73 kg/m      Jacqulyn Liner, RN 09/04/2021,8:08 AM

## 2021-09-06 NOTE — Progress Notes (Signed)
Radiation Oncology         [phone]) [phone] ________________________________  Name: [name] [name] MRN: [id number]  Date: 09/07/2021  DOB: May 02, 1950  Re-Evaluation Note  CC: [name], MD  [name], MD    ICD-10-CM   1. Malignant neoplasm of upper-outer quadrant of left breast in female, estrogen receptor positive ([place])  C50.412    Z17.0     2. Malignant neoplasm of right breast in female, estrogen receptor positive, unspecified site of breast ([place])  C50.911    Z17.0       Diagnosis:  S/p lumpectomy: Stage IA Right Breast UOQ, Invasive Ductal Carcinoma, ER+ / PR+ / Her2-, Grade 2  History of Stage IA (cT1c, cN0, cM0) Left breast Invasive Ductal Carcinoma diagnosed in 2017; s/p lumpectomy, radiation, and antiestrogens (anastrozle)    Narrative:  The patient returns today to discuss radiation treatment options. She was seen in consultation on 07/20/21.   Since consultation, she opted to proceed with right breast lumpectomy without nodal biopsies on 08/12/21 under the care of Dr. Ninfa Linden. Pathology from the procedure revealed: grade 2 invasive ductal carcinoma measuring 1.1 cm. X 0.7 cm. X 0.7 cm; all margins negative for invasive carcinoma. Prognostic indicators significant for: estrogen receptor 90% positive; progesterone receptor 60% positive; Proliferation marker Ki67 at 2%; Her2 status negative; Grade 2.    Oncotype DX was obtained on the final surgical sample and the recurrence score of 19 predicts a risk of recurrence outside the breast over the next 9 years of 6%, if the patient's only systemic therapy is an antiestrogen for 5 years.  It also predicts no significant benefit from chemotherapy.  The patient most recently followed up with Dr. Chryl Heck on 08/25/21. During which time, the role of abemaciclib in addition to Faslodex was discussed with the patient given that this is her second cancer, making her risk of recurrence higher (to review, the patient has been on Faslodex  for her previously diagnosed left breast cancer).  Following discussion, the patient voiced agreement in proceeding with CDK 4 6 inhibitors following XRT. She will return to Dr. Chryl Heck in about 4 weeks for further discussion. The patient was also given a prescription for doxycycline for some cellulitis noted over the skin next to the surgical site.     On review of systems, the patient reports doing well from her surgery. She denies pain in the right breast or swelling in her right arm and any other symptoms.  She does discuss a lot of other medical issues that are affecting her quality of life.   Allergies:  is allergic to codeine, influenza a (h1n1) monoval pf, hydrocodone-acetaminophen, lisinopril, metformin, pregabalin, and jardiance [empagliflozin].  Meds: Current Outpatient Medications  Medication Sig Dispense Refill   ACCU-CHEK AVIVA PLUS test strip USE AS INSTRUCTED TO CHECK BLOOD SUGAR 2 TIMES DAILY 100 strip 5   acetaminophen (TYLENOL) 500 MG tablet Take by mouth.     albuterol (VENTOLIN HFA) 108 (90 Base) MCG/ACT inhaler 1 puff as needed     Ascorbic Acid (VITAMIN C PO) Take by mouth daily.     benzonatate (TESSALON) 100 MG capsule Take 1 capsule (100 mg total) by mouth 2 (two) times daily as needed for cough. 20 capsule 0   buPROPion (WELLBUTRIN XL) 150 MG 24 hr tablet Take 1 tablet (150 mg total) by mouth every morning. 90 tablet 1   citalopram (CELEXA) 40 MG tablet Take 1 tablet (40 mg total) by mouth daily. 90 tablet 1  Cyanocobalamin (VITAMIN B-12 PO) Take by mouth daily.     dapagliflozin propanediol (FARXIGA) 10 MG TABS tablet Take 1 tablet (10 mg total) by mouth daily before breakfast. 90 tablet 3   doxycycline (VIBRA-TABS) 100 MG tablet Take 1 tablet (100 mg total) by mouth 2 (two) times daily. 14 tablet 0   fluticasone (FLONASE) 50 MCG/ACT nasal spray Place 1 spray into both nostrils as needed.      gabapentin (NEURONTIN) 600 MG tablet TAKE 1 TABLET IN THE MORNING AND 1  TABLET IN THE AFTERNOON AND 2TABS AT BEDTIME     insulin aspart (FIASP FLEXTOUCH) 100 UNIT/ML FlexTouch Pen Inject 18 Units into the skin with breakfast, with lunch, and with evening meal. 45 mL 3   Insulin Pen Needle (B-D UF III MINI PEN NEEDLES) 31G X 5 MM MISC USE AS DIRECTED WITH INSULIN EVERY MORNING, NOON, EVENING AND AT BEDTIME 400 each 3   Insulin Pen Needle 30G X 5 MM MISC 1 Device by Does not apply route in the morning, at noon, in the evening, and at bedtime. 400 each 3   levothyroxine (SYNTHROID) 150 MCG tablet Take 1 tablet (150 mcg total) by mouth daily. (Patient taking differently: Take 137 mcg by mouth daily.) 90 tablet 3   liraglutide (VICTOZA) 18 MG/3ML SOPN Inject 1.8 mg into the skin daily. 27 mL 3   losartan (COZAAR) 25 MG tablet Take 1 tablet (25 mg total) by mouth daily. 90 tablet 1   methocarbamol (ROBAXIN) 500 MG tablet Take 1 tablet (500 mg total) by mouth every 8 (eight) hours as needed for muscle spasms. 30 tablet 0   Misc Natural Products (TART CHERRY ADVANCED) CAPS Take 1 capsule by mouth daily.     Multiple Vitamins-Minerals (MULTIVITAMIN WITH MINERALS) tablet Take 1 tablet by mouth daily.     mycophenolate (CELLCEPT) 500 MG tablet Take 1,500 mg by mouth 2 (two) times daily.     Pyridoxine HCl (VITAMIN B-6 PO) Take by mouth daily.     traMADol (ULTRAM) 50 MG tablet Take 1 tablet (50 mg total) by mouth every 6 (six) hours as needed for moderate pain or severe pain. 20 tablet 0   VITAMIN D PO Take by mouth daily.     Insulin Disposable Pump (OMNIPOD 5 G6 INTRO, GEN 5,) KIT 1 Device by Does not apply route every 3 (three) days. (Patient not taking: Reported on 09/07/2021) 1 kit 0   Insulin Disposable Pump (OMNIPOD 5 G6 POD, GEN 5,) MISC 1 Device by Does not apply route every 3 (three) days. (Patient not taking: Reported on 09/07/2021) 6 each 3   Insulin Glargine (BASAGLAR KWIKPEN) 100 UNIT/ML Inject 60 Units into the skin daily. 60 mL 3   No current facility-administered  medications for this encounter.    Physical Findings: The patient is in no acute distress. Patient is alert and oriented.  height is _0  (1.549 m) and weight is 263 lb 3.2 oz (119.4 kg). Her temperature is 97.9 F (36.6 C). Her blood pressure is 134/61 and her pulse is 70. Her respiration is 20 and oxygen saturation is 96%.  No significant changes. Lungs are clear to auscultation bilaterally. Heart has regular rate and rhythm. No palpable cervical, supraclavicular, or axillary adenopathy. Abdomen soft, non-tender, normal bowel sounds. Left Breast: no palpable mass, nipple discharge or bleeding.  Minimal radiation changes noted Right Breast: Well-healed scar in the upper outer quadrant from her most recent surgery.  No signs of drainage or infection  within the breast.  No dominant mass appreciated in the breast.  Lab Findings: Lab Results  Component Value Date   WBC 3.7 (L) 08/12/2021   HGB 12.7 08/12/2021   HCT 39.6 08/12/2021   MCV 88.8 08/12/2021   PLT 80 (L) 08/12/2021    Radiographic Findings: MM Breast Surgical Specimen  Result Date: 08/12/2021 CLINICAL DATA:  Specimen radiograph status post right breast lumpectomy. EXAM: SPECIMEN RADIOGRAPH OF THE RIGHT BREAST COMPARISON:  Previous exam(s). FINDINGS: Status post excision of the right breast. The radioactive seed and biopsy marker clip are present and completely intact within the specimen. These findings were communicated with the OR at 8:39 a.m. IMPRESSION: Specimen radiograph of the right breast. Electronically Signed   By: Ammie Ferrier M.D.   On: 08/12/2021 08:39  MM RT RADIOACTIVE SEED LOC MAMMO GUIDE  Result Date: 08/11/2021 CLINICAL DATA:  Localization of right breast cancer prior to surgery. EXAM: MAMMOGRAPHIC GUIDED RADIOACTIVE SEED LOCALIZATION OF THE RIGHT BREAST COMPARISON:  Previous exam(s). FINDINGS: Patient presents for radioactive seed localization prior to surgery. I met with the patient and we discussed the  procedure of seed localization including benefits and alternatives. We discussed the high likelihood of a successful procedure. We discussed the risks of the procedure including infection, bleeding, tissue injury and further surgery. We discussed the low dose of radioactivity involved in the procedure. Informed, written consent was given. The usual time-out protocol was performed immediately prior to the procedure. Using mammographic guidance, sterile technique, 1% lidocaine and an I-125 radioactive seed, the known breast cancer was localized using a lateral approach. The follow-up mammogram images confirm the seed in the expected location and were marked for the surgeon. Follow-up survey of the patient confirms presence of the radioactive seed. Order number of I-125 seed:  [id number]. Total activity:  3.893 millicuries reference Date: Pentecost 10, 2023 The patient tolerated the procedure well and was released from the [employer]. She was given instructions regarding seed removal. IMPRESSION: Radioactive seed localization right breast. No apparent complications. Electronically Signed   By: Dorise Bullion III M.D.   On: 08/11/2021 11:06   Impression:  S/p lumpectomy: Stage IA Right Breast UOQ, Invasive Ductal Carcinoma, ER+ / PR+ / Her2-, Grade 2  The discussed options for management given her recently diagnosed right breast cancer.  We discussed that given her good prognosis she [place] potentially omit radiation therapy with the understanding that she would have a high risk for local recurrence.  This recurrence rate would however not affect her overall survival concerning her breast cancer.  We discussed the chances for recurrence with and without radiation therapy assuming she proceeds with adjuvant hormonal therapy as above.  We discussed the course of radiation therapy with anticipated side effects and potential long-term toxicities.  She is well aware of these issues as she has previously received left breast  radiation therapy.  I also discussed that she [place] be a potential candidate for hypofractionated accelerated radiation therapy over approximately 3 weeks.  After careful consideration the patient would like to proceed with adjuvant radiation therapy in addition to her adjuvant hormonal therapy and CDK inhibitor.  Plan:  She is scheduled for CT simulation August 9 with treatments to begin approximately a week later.  Anticipate 3 weeks of hypofractionated accelerated radiation therapy.  Do not anticipate potential overlap over the sternum given the location of her lumpectomy cavity and her previous radiation treatment set up.  -----------------------------------  Blair Promise, PhD, MD  This document serves  as a record of services personally performed by Gery Pray, MD. It was created on his behalf by Roney Mans, a trained medical scribe. The creation of this record is based on the scribe's personal observations and the provider's statements to them. This document has been checked and approved by the attending provider.

## 2021-09-07 ENCOUNTER — Other Ambulatory Visit: Payer: Self-pay

## 2021-09-07 ENCOUNTER — Ambulatory Visit
Admission: RE | Admit: 2021-09-07 | Discharge: 2021-09-07 | Disposition: A | Discharge status: Home / Self Care | Payer: Medicare Other | Source: Ambulatory Visit | Attending: Radiation Oncology | Admitting: Radiation Oncology

## 2021-09-07 ENCOUNTER — Encounter: Payer: Self-pay | Admitting: *Deleted

## 2021-09-07 ENCOUNTER — Encounter: Payer: Self-pay | Admitting: Radiation Oncology

## 2021-09-07 VITALS — BP 134/61 | HR 70 | Temp 97.9°F | Resp 20 | Ht 61.0 in | Wt 263.2 lb

## 2021-09-07 DIAGNOSIS — Z79811 Long term (current) use of aromatase inhibitors: Secondary | ICD-10-CM | POA: Diagnosis not present

## 2021-09-07 DIAGNOSIS — Z7984 Long term (current) use of oral hypoglycemic drugs: Secondary | ICD-10-CM | POA: Insufficient documentation

## 2021-09-07 DIAGNOSIS — Z79890 Hormone replacement therapy (postmenopausal): Secondary | ICD-10-CM | POA: Diagnosis not present

## 2021-09-07 DIAGNOSIS — Z79624 Long term (current) use of inhibitors of nucleotide synthesis: Secondary | ICD-10-CM | POA: Insufficient documentation

## 2021-09-07 DIAGNOSIS — C50412 Malignant neoplasm of upper-outer quadrant of left female breast: Secondary | ICD-10-CM | POA: Diagnosis not present

## 2021-09-07 DIAGNOSIS — Z170 Estrogen receptor positive status [ER+]: Secondary | ICD-10-CM

## 2021-09-07 DIAGNOSIS — Z79899 Other long term (current) drug therapy: Secondary | ICD-10-CM | POA: Insufficient documentation

## 2021-09-07 DIAGNOSIS — C50411 Malignant neoplasm of upper-outer quadrant of right female breast: Secondary | ICD-10-CM | POA: Insufficient documentation

## 2021-09-07 DIAGNOSIS — C50911 Malignant neoplasm of unspecified site of right female breast: Secondary | ICD-10-CM

## 2021-09-09 ENCOUNTER — Ambulatory Visit
Admission: RE | Admit: 2021-09-09 | Discharge: 2021-09-09 | Disposition: A | Discharge status: Home / Self Care | Payer: Medicare Other | Source: Ambulatory Visit | Attending: Radiation Oncology | Admitting: Radiation Oncology

## 2021-09-09 ENCOUNTER — Other Ambulatory Visit: Payer: Self-pay

## 2021-09-09 DIAGNOSIS — Z170 Estrogen receptor positive status [ER+]: Secondary | ICD-10-CM | POA: Insufficient documentation

## 2021-09-09 DIAGNOSIS — Z510 Encounter for antineoplastic radiation therapy: Secondary | ICD-10-CM | POA: Insufficient documentation

## 2021-09-09 DIAGNOSIS — C50412 Malignant neoplasm of upper-outer quadrant of left female breast: Secondary | ICD-10-CM | POA: Diagnosis not present

## 2021-09-09 DIAGNOSIS — C50911 Malignant neoplasm of unspecified site of right female breast: Secondary | ICD-10-CM | POA: Diagnosis not present

## 2021-09-09 DIAGNOSIS — Z5111 Encounter for antineoplastic chemotherapy: Secondary | ICD-10-CM | POA: Insufficient documentation

## 2021-09-09 DIAGNOSIS — C50411 Malignant neoplasm of upper-outer quadrant of right female breast: Secondary | ICD-10-CM | POA: Diagnosis not present

## 2021-09-14 ENCOUNTER — Encounter: Payer: Self-pay | Admitting: *Deleted

## 2021-09-15 ENCOUNTER — Other Ambulatory Visit: Payer: Self-pay | Admitting: Internal Medicine

## 2021-09-15 DIAGNOSIS — F3340 Major depressive disorder, recurrent, in remission, unspecified: Secondary | ICD-10-CM

## 2021-09-17 ENCOUNTER — Ambulatory Visit: Payer: Medicare Other | Admitting: Radiation Oncology

## 2021-09-17 DIAGNOSIS — C50412 Malignant neoplasm of upper-outer quadrant of left female breast: Secondary | ICD-10-CM | POA: Diagnosis not present

## 2021-09-17 DIAGNOSIS — C50911 Malignant neoplasm of unspecified site of right female breast: Secondary | ICD-10-CM | POA: Diagnosis not present

## 2021-09-17 DIAGNOSIS — C50411 Malignant neoplasm of upper-outer quadrant of right female breast: Secondary | ICD-10-CM | POA: Diagnosis not present

## 2021-09-17 DIAGNOSIS — Z170 Estrogen receptor positive status [ER+]: Secondary | ICD-10-CM | POA: Diagnosis not present

## 2021-09-17 DIAGNOSIS — Z5111 Encounter for antineoplastic chemotherapy: Secondary | ICD-10-CM | POA: Diagnosis not present

## 2021-09-17 DIAGNOSIS — Z510 Encounter for antineoplastic radiation therapy: Secondary | ICD-10-CM | POA: Diagnosis not present

## 2021-09-18 ENCOUNTER — Ambulatory Visit: Payer: Medicare Other

## 2021-09-21 ENCOUNTER — Ambulatory Visit: Payer: Self-pay

## 2021-09-21 ENCOUNTER — Ambulatory Visit: Payer: Medicare Other | Admitting: Radiation Oncology

## 2021-09-21 NOTE — Patient Outreach (Signed)
  Care Coordination   09/21/2021 Name: [name] [name] MRN: [id number] DOB: [birth year]   Care Coordination Outreach Attempts:  An unsuccessful telephone outreach was attempted today to offer the patient information about available care coordination services as a benefit of their health plan.   Follow Up Plan:  Additional outreach attempts will be made to offer the patient care coordination information and services.   Encounter Outcome:  No Answer  Care Coordination Interventions Activated:  No   Care Coordination Interventions:  No, not indicated    [facility] [phone]

## 2021-09-22 ENCOUNTER — Other Ambulatory Visit: Payer: Self-pay

## 2021-09-22 ENCOUNTER — Ambulatory Visit
Admission: RE | Admit: 2021-09-22 | Discharge: 2021-09-22 | Disposition: A | Discharge status: Home / Self Care | Payer: Medicare Other | Source: Ambulatory Visit | Attending: Radiation Oncology | Admitting: Radiation Oncology

## 2021-09-22 ENCOUNTER — Ambulatory Visit: Payer: Medicare Other

## 2021-09-22 DIAGNOSIS — Z5111 Encounter for antineoplastic chemotherapy: Secondary | ICD-10-CM | POA: Diagnosis not present

## 2021-09-22 DIAGNOSIS — C50411 Malignant neoplasm of upper-outer quadrant of right female breast: Secondary | ICD-10-CM | POA: Diagnosis not present

## 2021-09-22 DIAGNOSIS — C50412 Malignant neoplasm of upper-outer quadrant of left female breast: Secondary | ICD-10-CM | POA: Diagnosis not present

## 2021-09-22 DIAGNOSIS — C50911 Malignant neoplasm of unspecified site of right female breast: Secondary | ICD-10-CM

## 2021-09-22 DIAGNOSIS — Z170 Estrogen receptor positive status [ER+]: Secondary | ICD-10-CM | POA: Diagnosis not present

## 2021-09-22 DIAGNOSIS — Z510 Encounter for antineoplastic radiation therapy: Secondary | ICD-10-CM | POA: Diagnosis not present

## 2021-09-22 LAB — RAD ONC ARIA SESSION SUMMARY
Course Elapsed Days: 0
Plan Fractions Treated to Date: 1
Plan Prescribed Dose Per Fraction: 1.8 Gy
Plan Total Fractions Prescribed: 25
Plan Total Prescribed Dose: 45 Gy
Reference Point Dosage Given to Date: 1.8 Gy
Reference Point Session Dosage Given: 1.8 Gy
Session Number: 1

## 2021-09-22 MED ORDER — ALRA NON-METALLIC DEODORANT (RAD-ONC)
1.0000 | Freq: Once | TOPICAL | Status: AC
  Administered 2021-09-22: 1 via TOPICAL

## 2021-09-22 MED ORDER — RADIAPLEXRX EX GEL: Freq: Once | CUTANEOUS | Status: AC

## 2021-09-22 NOTE — Progress Notes (Signed)
Pt here for patient teaching.    Pt given Radiation and You booklet, skin care instructions, Alra deodorant, and Radiaplex gel.    Reviewed areas of pertinence such as fatigue, hair loss in treatment field, skin changes, breast tenderness, and breast swelling .   Pt able to give teach back of to pat skin, use unscented/gentle soap, and sitz bath,apply Radiaplex bid, avoid applying anything to skin within 4 hours of treatment, avoid wearing an under wire bra, and to use an electric razor if they must shave.   Pt verbalizes understanding of information given and will contact nursing with any questions or concerns.

## 2021-09-23 ENCOUNTER — Inpatient Hospital Stay: Payer: Medicare Other

## 2021-09-23 ENCOUNTER — Other Ambulatory Visit: Payer: Self-pay

## 2021-09-23 ENCOUNTER — Encounter: Payer: Self-pay | Admitting: Hematology and Oncology

## 2021-09-23 ENCOUNTER — Ambulatory Visit
Admission: RE | Admit: 2021-09-23 | Discharge: 2021-09-23 | Disposition: A | Discharge status: Home / Self Care | Payer: Medicare Other | Source: Ambulatory Visit | Attending: Radiation Oncology | Admitting: Radiation Oncology

## 2021-09-23 ENCOUNTER — Inpatient Hospital Stay (HOSPITAL_BASED_OUTPATIENT_CLINIC_OR_DEPARTMENT_OTHER): Payer: Medicare Other | Admitting: Hematology and Oncology

## 2021-09-23 VITALS — BP 125/48 | HR 84 | Temp 97.7°F | Resp 16 | Ht 61.0 in | Wt 264.1 lb

## 2021-09-23 DIAGNOSIS — C50911 Malignant neoplasm of unspecified site of right female breast: Secondary | ICD-10-CM | POA: Insufficient documentation

## 2021-09-23 DIAGNOSIS — Z170 Estrogen receptor positive status [ER+]: Secondary | ICD-10-CM | POA: Insufficient documentation

## 2021-09-23 DIAGNOSIS — C50411 Malignant neoplasm of upper-outer quadrant of right female breast: Secondary | ICD-10-CM | POA: Diagnosis not present

## 2021-09-23 DIAGNOSIS — Z5111 Encounter for antineoplastic chemotherapy: Secondary | ICD-10-CM | POA: Insufficient documentation

## 2021-09-23 DIAGNOSIS — C50412 Malignant neoplasm of upper-outer quadrant of left female breast: Secondary | ICD-10-CM | POA: Diagnosis not present

## 2021-09-23 DIAGNOSIS — Z510 Encounter for antineoplastic radiation therapy: Secondary | ICD-10-CM | POA: Diagnosis not present

## 2021-09-23 LAB — RAD ONC ARIA SESSION SUMMARY
Course Elapsed Days: 1
Plan Fractions Treated to Date: 2
Plan Prescribed Dose Per Fraction: 1.8 Gy
Plan Total Fractions Prescribed: 25
Plan Total Prescribed Dose: 45 Gy
Reference Point Dosage Given to Date: 3.6 Gy
Reference Point Session Dosage Given: 1.8 Gy
Session Number: 2

## 2021-09-23 MED ORDER — FULVESTRANT 250 MG/5ML IM SOSY
500.0000 mg | PREFILLED_SYRINGE | Freq: Once | INTRAMUSCULAR | Status: AC
  Administered 2021-09-23: 500 mg via INTRAMUSCULAR
  Filled 2021-09-23: qty 10

## 2021-09-23 NOTE — Progress Notes (Signed)
[place]  [phone] [phone]) [phone]     ID: [name] [name] DOB: October 07, 1950  MR#: [id number]  FXJ#:[id number]  Patient Care Team: [name], MD as PCP - General (Internal Medicine) [name], RN as Oncology Nurse Navigator [name], RN as Oncology Nurse Navigator [name], MD as Consulting Physician (Hematology and Oncology) Neldon Labella, RN as Case Manager OTHER MD:  CHIEF COMPLAINT: Estrogen receptor positive invasive breast cancer  CURRENT TREATMENT: anastrozole  BREAST CANCER HISTORY:  Oncology History  Malignant neoplasm of upper-outer quadrant of left breast in female, estrogen receptor positive ([place])  08/19/2015 Initial Diagnosis   Malignant neoplasm of upper-outer quadrant of left breast in female, estrogen receptor positive ([place])    Mammogram   71 y.o. [name] status post left breast upper outer quadrant lumpectomy 08/04/2015 for a pT1c pNX, stage I A invasive ductal carcinoma, estrogen and progesterone receptor positive, HER-2 not amplified, with no HER-2 amplification, anterior margin was focally positive, additional Left breast surgery 09/12/2015 cleared the margins and found all 5 axillary lymph nodes sampled clear for a final stage pT1c pN0, stage IA  Oncotype DX score of 12 predicts a 10 year risk of recurrence outside the breast of 8% if the patient's only systemic therapy is tamoxifen for 5 years. It also predicts no benefit from therapy. She had adjuvant radiation 10/22/15 - 12/08/15              1) Left breast: 50.4 Gy in 28 fractions              2) Left breast boost: 10 Gy in 5 fractions Started adjuvant anastrozole 02/02/2015    06/10/2021 Mammogram   Mammogram showed suspicious mass in 10:30 o'clock location of the RIGHT breast 9 centimeters from the nipple correlating with distortion seen mammographically. [place] right axilla negative. Pathology showed IDC, ER 90% positive, strong staining, PR  60% positive, moderate staining. Her 2 negative. Ki 2%. She was taking anastrazole when this abnormality was detected.    08/12/2021 Pathology Results   She had right breast lumpectomy on July 12 which showed invasive ductal carcinoma with clear margins of resection, overall grade 2, tumor size 1.1 x 0.7 x 0.7 ER +90% PR +60% KI 2% and HER2 2+ by IHC negative by [facility]    Interval History  Patient is here for follow-up.  She tells me that she is tired.  She has 1 thing happening after another.  She is now dealing with radiation.  She also has a lot of ongoing eye issues with ingrown eyelashes which needed removal.  She tells me that she has a possible infection in the left eye, follows up with ophthalmology.  She is otherwise tolerating the Faslodex well.   Rest of the pertinent 10 point ROS reviewed and negative  PAST MEDICAL HISTORY: Past Medical History:  Diagnosis Date   Abdominal wall contusion 12/09/2011   Achilles tendon contracture, bilateral 06/03/2016   Anxiety    Arthritis    Blood dyscrasia    low platelet count- sees [name] , [name] ( Note in [place] from 07/2014) at [facility]) 2017   Left Breast   Breast cancer ([place]) 07/2021   right breast IDC   Cancer ([place])    left breast   Chest wall contusion 12/09/2011   Cirrhosis of liver (H[facility])    Cough    Diabetes mellitus without complication ([place])    Diabetic  neuropathy ([place])    Diarrhea due to drug 01/29/2021   Dyspnea    Gout    Gout    History of radiation therapy 10/22/15 - 12/08/15   left breast 50.4 Gy, boost to 10 Gy   Hypertension    Hypothyroidism    Migraine    Multifactorial gait disorder 01/12/2013   Neuromuscular disorder ([place])    diabetic neuropathy   Neuropathy    Personal history of radiation therapy 2017   Left Breast Cancer   Posterior tibial tendon dysfunction (PTTD) of right lower extremity 10/09/2019   Sleep apnea    CPAP nightly   Spleen enlarged     Thyroid disease     PAST SURGICAL HISTORY: Past Surgical History:  Procedure Laterality Date   ANKLE FUSION Right 2022   BREAST BIOPSY Left 08/04/2015   Procedure: LEFT BREAST BIOPSY WITH NEEDLE LOCALIZATION;  Surgeon: [name], MD;  Location: [place];  Service: General;  Laterality: Left;   BREAST BIOPSY Right 06/23/2021   BREAST DUCTAL SYSTEM EXCISION Left 08/04/2015   Procedure: LEFT EXCISION DUCTAL SYSTEM BREAST;  Surgeon: [name], MD;  Location: [place];  Service: General;  Laterality: Left;   BREAST LUMPECTOMY Left 08/2015   BREAST LUMPECTOMY WITH AXILLARY LYMPH NODE BIOPSY Left 09/12/2015   Procedure: RE-EXCISION OF LEFT BREAST LUMPECTOMY WITH LEFT AXILLARY LYMPH NODE BIOPSY;  Surgeon: [name], MD;  Location: [place];  Service: General;  Laterality: Left;   BREAST LUMPECTOMY WITH RADIOACTIVE SEED LOCALIZATION Right 08/12/2021   Procedure: RIGHT BREAST LUMPECTOMY WITH RADIOACTIVE SEED LOCALIZATION;  Surgeon: Coralie Keens, MD;  Location: [place];  Service: General;  Laterality: Right;  LMA   CHOLECYSTECTOMY     EYE SURGERY Bilateral 2022   Lashes growing backwards into the eye   JOINT REPLACEMENT     left knee   JOINT REPLACEMENT  2017   right knee   REVERSE SHOULDER ARTHROPLASTY Right 09/07/2018   Procedure: RIGHT REVERSE SHOULDER ARTHROPLASTY;  Surgeon: Meredith Pel, MD;  Location: [place];  Service: Orthopedics;  Laterality: Right;   TOTAL KNEE ARTHROPLASTY Right 02/28/2015   Procedure: RIGHT TOTAL KNEE ARTHROPLASTY;  Surgeon: [name], MD;  Location: [facility];  Service: Orthopedics;  Laterality: Right;   TUBAL LIGATION      FAMILY HISTORY Family History  Problem Relation Age of Onset   Hypothyroidism Mother    Alzheimer's disease Mother    Diabetes Father    Cancer Sister        GYN cancer   Hypothyroidism Sister    Heart disease Brother    Arthritis Son     Diabetes Son    Arthritis Son    Cancer Maternal Grandmother    Heart failure Maternal Grandfather    Bone cancer Paternal Grandmother    Heart failure Paternal Grandmother   The patient's father died from an encephalitis at the age of 43. The patient's mother is living at age 23. The patient had one brother, 3 sisters. A paternal grandf mother had "bone cancer". A paternal aunt, present today also had "bone cancer", but on further questioning this was myeloma. The patient's sister [name] was diagnosed with ovarian and uterine cancer and has been genetically tested but the test was negative   GYNECOLOGIC HISTORY:  No LMP recorded. Patient is postmenopausal. Menarche age 25 and first live birth age 64, menopause in her 69s. The patient never took hormone replacement or oral  contraceptives.   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 Her husband [name] is a Games developer. At home is just the 2 of them, the patient's aunt who is under hospice care for her myeloma, and the patient's dog. Son [name] lives in [place] and works for an Scientist, research (medical).Pandora Leiter Jeneen Rinks to be also lives in [place] and is a Tax inspector. Daughter Alinda Money lives in [place] and works for the [employer] school system as F Careers information officer. The patient has 5 grandchildren. She attends a [employer].    ADVANCED DIRECTIVES: In the absence of any documentation to the contrary, the patient's spouse is their HCPOA.    HEALTH MAINTENANCE: Social History   Tobacco Use   Smoking status: Never   Smokeless tobacco: Never  Vaping Use   Vaping Use: Never used  Substance Use Topics   Alcohol use: No   Drug use: No     Colonoscopy: 2015/[facility]  PAP:  Bone density:   Allergies  Allergen Reactions   Codeine Swelling   Influenza A (H1n1) Monoval Pf Anaphylaxis   Hydrocodone-Acetaminophen Other (See Comments)    Lisinopril Other (See Comments)   Metformin Other (See Comments)   Pregabalin Other (See Comments)   Jardiance [Empagliflozin] Other (See Comments)    dehydration    Current Outpatient Medications  Medication Sig Dispense Refill   ACCU-CHEK AVIVA PLUS test strip USE AS INSTRUCTED TO CHECK BLOOD SUGAR 2 TIMES DAILY 100 strip 5   acetaminophen (TYLENOL) 500 MG tablet Take by mouth.     albuterol (VENTOLIN HFA) 108 (90 Base) MCG/ACT inhaler 1 puff as needed     Ascorbic Acid (VITAMIN C PO) Take by mouth daily.     benzonatate (TESSALON) 100 MG capsule Take 1 capsule (100 mg total) by mouth 2 (two) times daily as needed for cough. 20 capsule 0   buPROPion (WELLBUTRIN XL) 150 MG 24 hr tablet Take 1 tablet (150 mg total) by mouth every morning. 90 tablet 1   citalopram (CELEXA) 40 MG tablet TAKE 1 TABLET BY MOUTH EVERY DAY 90 tablet 1   Cyanocobalamin (VITAMIN B-12 PO) Take by mouth daily.     dapagliflozin propanediol (FARXIGA) 10 MG TABS tablet Take 1 tablet (10 mg total) by mouth daily before breakfast. 90 tablet 3   doxycycline (VIBRA-TABS) 100 MG tablet Take 1 tablet (100 mg total) by mouth 2 (two) times daily. 14 tablet 0   fluticasone (FLONASE) 50 MCG/ACT nasal spray Place 1 spray into both nostrils as needed.      gabapentin (NEURONTIN) 600 MG tablet TAKE 1 TABLET IN THE MORNING AND 1 TABLET IN THE AFTERNOON AND 2TABS AT BEDTIME     insulin aspart (FIASP FLEXTOUCH) 100 UNIT/ML FlexTouch Pen Inject 18 Units into the skin with breakfast, with lunch, and with evening meal. 45 mL 3   Insulin Disposable Pump (OMNIPOD 5 G6 INTRO, GEN 5,) KIT 1 Device by Does not apply route every 3 (three) days. (Patient not taking: Reported on 09/07/2021) 1 kit 0   Insulin Disposable Pump (OMNIPOD 5 G6 POD, GEN 5,) MISC 1 Device by Does not apply route every 3 (three) days. (Patient not taking: Reported on 09/07/2021) 6 each 3   Insulin Glargine (BASAGLAR KWIKPEN) 100 UNIT/ML Inject 60 Units into the skin daily.  60 mL 3   Insulin Pen Needle (B-D UF III MINI PEN NEEDLES) 31G X 5 MM MISC  USE AS DIRECTED WITH INSULIN EVERY MORNING, NOON, EVENING AND AT BEDTIME 400 each 3   Insulin Pen Needle 30G X 5 MM MISC 1 Device by Does not apply route in the morning, at noon, in the evening, and at bedtime. 400 each 3   levothyroxine (SYNTHROID) 150 MCG tablet Take 1 tablet (150 mcg total) by mouth daily. (Patient taking differently: Take 137 mcg by mouth daily.) 90 tablet 3   liraglutide (VICTOZA) 18 MG/3ML SOPN Inject 1.8 mg into the skin daily. 27 mL 3   losartan (COZAAR) 25 MG tablet Take 1 tablet (25 mg total) by mouth daily. 90 tablet 1   methocarbamol (ROBAXIN) 500 MG tablet Take 1 tablet (500 mg total) by mouth every 8 (eight) hours as needed for muscle spasms. 30 tablet 0   Misc Natural Products (TART CHERRY ADVANCED) CAPS Take 1 capsule by mouth daily.     Multiple Vitamins-Minerals (MULTIVITAMIN WITH MINERALS) tablet Take 1 tablet by mouth daily.     mycophenolate (CELLCEPT) 500 MG tablet Take 1,500 mg by mouth 2 (two) times daily.     Pyridoxine HCl (VITAMIN B-6 PO) Take by mouth daily.     traMADol (ULTRAM) 50 MG tablet Take 1 tablet (50 mg total) by mouth every 6 (six) hours as needed for moderate pain or severe pain. 20 tablet 0   VITAMIN D PO Take by mouth daily.     No current facility-administered medications for this visit.    OBJECTIVE: White woman examined in a wheelchair  Vitals:   09/23/21 1111  BP: (!) 125/48  Pulse: 84  Resp: 16  Temp: 97.7 F (36.5 C)  SpO2: 95%     Wt Readings from Last 3 Encounters:  09/23/21 264 lb 1.6 oz (119.8 kg)  09/07/21 263 lb 3.2 oz (119.4 kg)  08/25/21 263 lb 1.6 oz (119.3 kg)   Body mass index is 49.9 kg/m.    ECOG FS:2 - Symptomatic, <50% confined to bed  Physical Exam Constitutional:      Appearance: Normal appearance.     Comments:    Cardiovascular:     Rate and Rhythm: Normal rate and regular rhythm.     Pulses: Normal pulses.      Heart sounds: Normal heart sounds.  Chest:     Comments: Breast exam deferred today Musculoskeletal:     Cervical back: Normal range of motion and neck supple. No rigidity.     Comments: She is in a wheelchair today  Lymphadenopathy:     Cervical: No cervical adenopathy.  Neurological:     Mental Status: She is alert.      LAB RESULTS:  CMP     Component Value Date/Time   NA 136 08/10/2021 0956   NA 138 07/05/2018 1635   NA 142 10/06/2016 1211   K 4.0 08/10/2021 0956   K 3.8 10/06/2016 1211   CL 99 08/10/2021 0956   CO2 23 08/10/2021 0956   CO2 25 10/06/2016 1211   GLUCOSE 146 (H) 08/10/2021 0956   GLUCOSE 179 (H) 10/06/2016 1211   BUN 15 08/10/2021 0956   BUN 19 07/05/2018 1635   BUN 16.9 10/06/2016 1211   CREATININE 0.91 08/10/2021 0956   CREATININE 1.19 (H) 12/25/2019 1315   CREATININE 1.09 (H) 09/28/2017 1516   CREATININE 1.1 10/06/2016 1211   CALCIUM 10.4 (H) 08/10/2021 0956   CALCIUM 10.4 10/06/2016 1211   PROT 7.5 12/25/2019 1315   PROT 7.5 07/05/2018 1635   PROT 7.6 10/06/2016  1211   ALBUMIN 3.5 12/25/2019 1315   ALBUMIN 4.6 07/05/2018 1635   ALBUMIN 3.9 10/06/2016 1211   AST 33 12/25/2019 1315   AST 33 10/06/2016 1211   ALT 18 12/25/2019 1315   ALT 20 10/06/2016 1211   ALKPHOS 118 12/25/2019 1315   ALKPHOS 86 10/06/2016 1211   BILITOT 0.4 12/25/2019 1315   BILITOT 0.52 10/06/2016 1211   GFRNONAA >60 08/10/2021 0956   GFRNONAA 49 (L) 12/25/2019 1315   GFRNONAA 52 (L) 09/28/2017 1516   GFRAA >60 11/07/2018 1133   GFRAA 61 09/28/2017 1516    INo results found for: "SPEP", "UPEP"  Lab Results  Component Value Date   WBC 3.7 (L) 08/12/2021   NEUTROABS 2.4 02/18/2021   HGB 12.7 08/12/2021   HCT 39.6 08/12/2021   MCV 88.8 08/12/2021   PLT 80 (L) 08/12/2021      Chemistry      Component Value Date/Time   NA 136 08/10/2021 0956   NA 138 07/05/2018 1635   NA 142 10/06/2016 1211   K 4.0 08/10/2021 0956   K 3.8 10/06/2016 1211   CL 99  08/10/2021 0956   CO2 23 08/10/2021 0956   CO2 25 10/06/2016 1211   BUN 15 08/10/2021 0956   BUN 19 07/05/2018 1635   BUN 16.9 10/06/2016 1211   CREATININE 0.91 08/10/2021 0956   CREATININE 1.19 (H) 12/25/2019 1315   CREATININE 1.09 (H) 09/28/2017 1516   CREATININE 1.1 10/06/2016 1211      Component Value Date/Time   CALCIUM 10.4 (H) 08/10/2021 0956   CALCIUM 10.4 10/06/2016 1211   ALKPHOS 118 12/25/2019 1315   ALKPHOS 86 10/06/2016 1211   AST 33 12/25/2019 1315   AST 33 10/06/2016 1211   ALT 18 12/25/2019 1315   ALT 20 10/06/2016 1211   BILITOT 0.4 12/25/2019 1315   BILITOT 0.52 10/06/2016 1211       No results found for: "LABCA2"  No components found for: "LABCA125"  No results for input(s): "INR" in the last 168 hours.  Urinalysis    Component Value Date/Time   COLORURINE YELLOW 08/30/2018 1357   APPEARANCEUR CLEAR 08/30/2018 1357   LABSPEC 1.024 08/30/2018 1357   PHURINE 5.0 08/30/2018 1357   GLUCOSEU >=500 (A) 08/30/2018 1357   HGBUR NEGATIVE 08/30/2018 1357   BILIRUBINUR NEGATIVE 08/30/2018 1357   KETONESUR NEGATIVE 08/30/2018 1357   PROTEINUR NEGATIVE 08/30/2018 1357   UROBILINOGEN 0.2 12/09/2011 1907   NITRITE NEGATIVE 08/30/2018 1357   LEUKOCYTESUR NEGATIVE 08/30/2018 1357    STUDIES: No results found.   ELIGIBLE FOR AVAILABLE RESEARCH PROTOCOL: no  ASSESSMENT: 71 y.o. [name] status post left breast upper outer quadrant lumpectomy 08/04/2015 for a pT1c pNX, stage I A invasive ductal carcinoma, estrogen and progesterone receptor positive, HER-2 not amplified, with no HER-2 amplification.  (a) anterior margin was focally positive  (1) additional Left breast surgery 09/12/2015 cleared the margins and found all 5 axillary lymph nodes sampled clear for a final stage pT1c pN0, stage IA  (2) Oncotype DX score of 12 predicts a 10 year risk of recurrence outside the breast of 8% if the patient's only systemic therapy is tamoxifen for 5 years. It  also predicts no benefit from therapy.   (3) Adjuvant radiation 10/22/15 - 12/08/15   1) Left breast: 50.4 Gy in 28 fractions              2) Left breast boost: 10 Gy in 5 fractions  (4) started  anastrozole 02/02/2015, completed 6 yrs.  (5) She has mammogram recently 06/20/2021, possible asymmetry within the OUTER RIGHT breast on CC views warrants further evaluation. Targeted ultrasound is performed, showing hypoechoic oval mass with indistinct margins in the 10:30 o'clock location of the RIGHT breast 9 centimeters from nipple measuring 0.5 x 0.4 x 0.4 centimeters. This area is approximately 1.8 centimeters from the palpable bruise, bruise was thought to be fat necrosis.  Right breast lumpectomy showed invasive ductal carcinoma with clear margins of resection, final pathology showed tumor measuring 1.1 cm x 0.7 x 8.7 grade 3 previous biopsy showed ER 90% positive, PR 60% positive, Ki-67 was 2% HER2 2+ by IHC and negative by FISH  PLAN:  Patient is status post right breast lumpectomy which showed invasive ductal carcinoma, grade 3, ER/PR positive, low proliferation index and she is here to review Oncotype results.  Oncotype score resulted at 12 hence there Chong not be significant benefit with addition of chemotherapy despite the nodal status. After her last visit, we have discussed about considering adding abemaciclib to Faslodex since this is her second cancer.  She will start abemaciclib after completing adjuvant radiation. She will complete radiation in approximately 4 weeks. She will return to clinic in 4 weeks to initiate abemaciclib.   We will consider dose modification given her performance status. She will continue to follow-up with her ophthalmologist for her health issues.   All her questions were answered to the best of my knowledge.  Total time spent: 30 minutes *Total Encounter Time as defined by the Centers for Medicare and Medicaid Services includes, in addition to the  face-to-face time of a patient visit (documented in the note above) non-face-to-face time: obtaining and reviewing outside history, ordering and reviewing medications, tests or procedures, care coordination (communications with other health care professionals or caregivers) and documentation in the medical record.

## 2021-09-24 ENCOUNTER — Other Ambulatory Visit: Payer: Self-pay

## 2021-09-24 ENCOUNTER — Other Ambulatory Visit: Payer: Self-pay | Admitting: Physician Assistant

## 2021-09-24 ENCOUNTER — Ambulatory Visit
Admission: RE | Admit: 2021-09-24 | Discharge: 2021-09-24 | Disposition: A | Discharge status: Home / Self Care | Payer: Medicare Other | Source: Ambulatory Visit | Attending: Radiation Oncology | Admitting: Radiation Oncology

## 2021-09-24 ENCOUNTER — Telehealth: Payer: Self-pay | Admitting: Orthopaedic Surgery

## 2021-09-24 DIAGNOSIS — C50911 Malignant neoplasm of unspecified site of right female breast: Secondary | ICD-10-CM | POA: Diagnosis not present

## 2021-09-24 DIAGNOSIS — Z170 Estrogen receptor positive status [ER+]: Secondary | ICD-10-CM | POA: Diagnosis not present

## 2021-09-24 DIAGNOSIS — C50411 Malignant neoplasm of upper-outer quadrant of right female breast: Secondary | ICD-10-CM | POA: Diagnosis not present

## 2021-09-24 DIAGNOSIS — C50412 Malignant neoplasm of upper-outer quadrant of left female breast: Secondary | ICD-10-CM | POA: Diagnosis not present

## 2021-09-24 DIAGNOSIS — Z5111 Encounter for antineoplastic chemotherapy: Secondary | ICD-10-CM | POA: Diagnosis not present

## 2021-09-24 DIAGNOSIS — Z510 Encounter for antineoplastic radiation therapy: Secondary | ICD-10-CM | POA: Diagnosis not present

## 2021-09-24 LAB — RAD ONC ARIA SESSION SUMMARY
Course Elapsed Days: 2
Plan Fractions Treated to Date: 3
Plan Prescribed Dose Per Fraction: 1.8 Gy
Plan Total Fractions Prescribed: 25
Plan Total Prescribed Dose: 45 Gy
Reference Point Dosage Given to Date: 5.4 Gy
Reference Point Session Dosage Given: 1.8 Gy
Session Number: 3

## 2021-09-24 MED ORDER — METHOCARBAMOL 500 MG PO TABS: 500.0000 mg | ORAL_TABLET | Freq: Three times a day (TID) | ORAL | 0 refills | Status: AC | PRN

## 2021-09-24 NOTE — Telephone Encounter (Signed)
Pt called and is wondering if she can get some muscle relaxers   [phone]

## 2021-09-25 ENCOUNTER — Ambulatory Visit
Admission: RE | Admit: 2021-09-25 | Discharge: 2021-09-25 | Disposition: A | Discharge status: Home / Self Care | Payer: Medicare Other | Source: Ambulatory Visit | Attending: Radiation Oncology | Admitting: Radiation Oncology

## 2021-09-25 ENCOUNTER — Other Ambulatory Visit: Payer: Self-pay

## 2021-09-25 DIAGNOSIS — Z5111 Encounter for antineoplastic chemotherapy: Secondary | ICD-10-CM | POA: Diagnosis not present

## 2021-09-25 DIAGNOSIS — Z510 Encounter for antineoplastic radiation therapy: Secondary | ICD-10-CM | POA: Diagnosis not present

## 2021-09-25 DIAGNOSIS — C50911 Malignant neoplasm of unspecified site of right female breast: Secondary | ICD-10-CM | POA: Diagnosis not present

## 2021-09-25 DIAGNOSIS — C50412 Malignant neoplasm of upper-outer quadrant of left female breast: Secondary | ICD-10-CM | POA: Diagnosis not present

## 2021-09-25 DIAGNOSIS — Z170 Estrogen receptor positive status [ER+]: Secondary | ICD-10-CM | POA: Diagnosis not present

## 2021-09-25 DIAGNOSIS — C50411 Malignant neoplasm of upper-outer quadrant of right female breast: Secondary | ICD-10-CM | POA: Diagnosis not present

## 2021-09-25 LAB — RAD ONC ARIA SESSION SUMMARY
Course Elapsed Days: 3
Plan Fractions Treated to Date: 4
Plan Prescribed Dose Per Fraction: 1.8 Gy
Plan Total Fractions Prescribed: 25
Plan Total Prescribed Dose: 45 Gy
Reference Point Dosage Given to Date: 7.2 Gy
Reference Point Session Dosage Given: 1.8 Gy
Session Number: 4

## 2021-09-26 ENCOUNTER — Encounter (HOSPITAL_BASED_OUTPATIENT_CLINIC_OR_DEPARTMENT_OTHER): Payer: Self-pay | Admitting: Emergency Medicine

## 2021-09-26 ENCOUNTER — Emergency Department (HOSPITAL_BASED_OUTPATIENT_CLINIC_OR_DEPARTMENT_OTHER): Payer: Medicare Other

## 2021-09-26 ENCOUNTER — Emergency Department (HOSPITAL_BASED_OUTPATIENT_CLINIC_OR_DEPARTMENT_OTHER): Payer: Medicare Other | Admitting: Radiology

## 2021-09-26 ENCOUNTER — Other Ambulatory Visit: Payer: Self-pay

## 2021-09-26 ENCOUNTER — Emergency Department (HOSPITAL_BASED_OUTPATIENT_CLINIC_OR_DEPARTMENT_OTHER)
Admission: EM | Admit: 2021-09-26 | Discharge: 2021-09-26 | Disposition: A | Discharge status: Home / Self Care | Payer: Medicare Other | Attending: Emergency Medicine | Admitting: Emergency Medicine

## 2021-09-26 DIAGNOSIS — Z471 Aftercare following joint replacement surgery: Secondary | ICD-10-CM | POA: Diagnosis not present

## 2021-09-26 DIAGNOSIS — M25552 Pain in left hip: Secondary | ICD-10-CM | POA: Diagnosis not present

## 2021-09-26 DIAGNOSIS — Z853 Personal history of malignant neoplasm of breast: Secondary | ICD-10-CM | POA: Diagnosis not present

## 2021-09-26 DIAGNOSIS — K5730 Diverticulosis of large intestine without perforation or abscess without bleeding: Secondary | ICD-10-CM | POA: Diagnosis not present

## 2021-09-26 DIAGNOSIS — M5136 Other intervertebral disc degeneration, lumbar region: Secondary | ICD-10-CM | POA: Diagnosis not present

## 2021-09-26 DIAGNOSIS — Z96652 Presence of left artificial knee joint: Secondary | ICD-10-CM | POA: Diagnosis not present

## 2021-09-26 DIAGNOSIS — M47816 Spondylosis without myelopathy or radiculopathy, lumbar region: Secondary | ICD-10-CM | POA: Diagnosis not present

## 2021-09-26 MED ORDER — DROPERIDOL 2.5 MG/ML IJ SOLN
2.5000 mg | Freq: Once | INTRAMUSCULAR | Status: AC
  Administered 2021-09-26: 2.5 mg via INTRAMUSCULAR
  Filled 2021-09-26: qty 2

## 2021-09-26 MED ORDER — ACETAMINOPHEN 500 MG PO TABS
1000.0000 mg | ORAL_TABLET | Freq: Once | ORAL | Status: DC
  Filled 2021-09-26: qty 2

## 2021-09-26 MED ORDER — LIDOCAINE 5 % EX PTCH
1.0000 | MEDICATED_PATCH | CUTANEOUS | Status: DC
Start: 2021-09-26 — End: 2021-09-26
  Administered 2021-09-26: 1 via TRANSDERMAL
  Filled 2021-09-26: qty 1

## 2021-09-26 MED ORDER — OXYCODONE HCL 5 MG PO TABS
5.0000 mg | ORAL_TABLET | ORAL | Status: AC
  Administered 2021-09-26: 5 mg via ORAL
  Filled 2021-09-26: qty 1

## 2021-09-26 MED ORDER — LIDOCAINE 4 % EX PTCH: 1.0000 | MEDICATED_PATCH | CUTANEOUS | 0 refills | Status: AC

## 2021-09-26 NOTE — Discharge Instructions (Signed)
Today you were seen in the emergency department for your hip pain.    In the emergency department you had CT scans and an x-ray that were reassuring.    At home, please use the medication that you were prescribed along with lidocaine patches and heat for your pain.    Follow-up with your primary doctor in 2-3 days regarding your visit.    Return immediately to the emergency department if you experience any of the following: worsening pain, weakness or numbness of your legs, difficulty urinating, bowel incontinence, or any other concerning symptoms.    Thank you for visiting our Emergency Department. It was a pleasure taking care of you today.

## 2021-09-26 NOTE — ED Provider Notes (Signed)
[facility] Provider Note   CSN: [id number] Arrival date & time: 09/26/21  9628     History  Chief Complaint  Patient presents with   Hip Pain    [name] [name] is a 71 y.o. female.  71 yo F with hx of stage 1 breast cancer on radiation and chemo, cirrhosis, and gout who presents with back pain.  - Started 1 week ago, no injuries noted - Was improving then worsened over past few days - Starts in hip and radiates down leg - No numbness or weakness that is new (does have chronic numbness of the feet from neuropathy) - No bowel or bladder incontinence or changes in sensation after wiping while using the restroom - Having difficulty walking 2/2 the pain - Denies fevers in the past few days - No hx of IVDU - No surgeries on the hip   Hip Pain       Home Medications Prior to Admission medications   Medication Sig Start Date End Date Taking? Authorizing Provider  lidocaine (LIDO KING) 4 % Place 1 patch onto the skin daily for 7 days. 09/26/21 10/03/21 Yes Fransico Meadow, MD  ACCU-CHEK AVIVA PLUS test strip USE AS INSTRUCTED TO CHECK BLOOD SUGAR 2 TIMES DAILY 08/13/21   Shamleffer, Melanie Crazier, MD  acetaminophen (TYLENOL) 500 MG tablet Take by mouth. 10/18/19   [provider]  albuterol (VENTOLIN HFA) 108 (90 Base) MCG/ACT inhaler 1 puff as needed 02/26/20   [provider]  Ascorbic Acid (VITAMIN C PO) Take by mouth daily.    [provider]  benzonatate (TESSALON) 100 MG capsule Take 1 capsule (100 mg total) by mouth 2 (two) times daily as needed for cough. 01/29/21   [name], MD  buPROPion (WELLBUTRIN XL) 150 MG 24 hr tablet Take 1 tablet (150 mg total) by mouth every morning. 01/29/21   [name], MD  citalopram (CELEXA) 40 MG tablet TAKE 1 TABLET BY MOUTH EVERY DAY 09/15/21   [name], MD  Cyanocobalamin (VITAMIN B-12 PO) Take by mouth daily.    [provider]  dapagliflozin propanediol  (FARXIGA) 10 MG TABS tablet Take 1 tablet (10 mg total) by mouth daily before breakfast. 11/13/20   Shamleffer, Melanie Crazier, MD  doxycycline (VIBRA-TABS) 100 MG tablet Take 1 tablet (100 mg total) by mouth 2 (two) times daily. 08/25/21   [name], MD  fluticasone (FLONASE) 50 MCG/ACT nasal spray Place 1 spray into both nostrils as needed.  05/02/18   [provider]  gabapentin (NEURONTIN) 600 MG tablet TAKE 1 TABLET IN THE MORNING AND 1 TABLET IN THE AFTERNOON AND 2TABS AT BEDTIME    [provider]  insulin aspart (FIASP FLEXTOUCH) 100 UNIT/ML FlexTouch Pen Inject 18 Units into the skin with breakfast, with lunch, and with evening meal. 11/13/20   Shamleffer, Melanie Crazier, MD  Insulin Disposable Pump (OMNIPOD 5 G6 INTRO, GEN 5,) KIT 1 Device by Does not apply route every 3 (three) days. Patient not taking: Reported on 09/07/2021 11/21/20   Shamleffer, Melanie Crazier, MD  Insulin Disposable Pump (OMNIPOD 5 G6 POD, GEN 5,) MISC 1 Device by Does not apply route every 3 (three) days. Patient not taking: Reported on 09/07/2021 12/23/20   Shamleffer, Melanie Crazier, MD  Insulin Glargine [facility]) 100 UNIT/ML Inject 60 Units into the skin daily. 06/09/21   Shamleffer, Melanie Crazier, MD  Insulin Pen Needle (B-D UF III MINI PEN NEEDLES) 31G X 5  MM MISC USE AS DIRECTED WITH INSULIN EVERY MORNING, NOON, EVENING AND AT BEDTIME 06/26/21   Shamleffer, Melanie Crazier, MD  Insulin Pen Needle 30G X 5 MM MISC 1 Device by Does not apply route in the morning, at noon, in the evening, and at bedtime. 05/30/20   Shamleffer, Melanie Crazier, MD  levothyroxine (SYNTHROID) 150 MCG tablet Take 1 tablet (150 mcg total) by mouth daily. Patient taking differently: Take 137 mcg by mouth daily. 06/09/21   Shamleffer, Melanie Crazier, MD  liraglutide (VICTOZA) 18 MG/3ML SOPN Inject 1.8 mg into the skin daily. 11/13/20   Shamleffer, Melanie Crazier, MD  losartan (COZAAR) 25 MG tablet Take 1 tablet  (25 mg total) by mouth daily. 01/29/21   [name], MD  methocarbamol (ROBAXIN) 500 MG tablet Take 1 tablet (500 mg total) by mouth every 8 (eight) hours as needed for muscle spasms. 09/24/21   Pete Pelt, PA-C  Misc Natural Products [facility] ADVANCED) CAPS Take 1 capsule by mouth daily.    [provider]  Multiple Vitamins-Minerals (MULTIVITAMIN WITH MINERALS) tablet Take 1 tablet by mouth daily.    [provider]  mycophenolate (CELLCEPT) 500 MG tablet Take 1,500 mg by mouth 2 (two) times daily. 04/02/21   [provider]  Pyridoxine HCl (VITAMIN B-6 PO) Take by mouth daily.    [provider]  traMADol (ULTRAM) 50 MG tablet Take 1 tablet (50 mg total) by mouth every 6 (six) hours as needed for moderate pain or severe pain. 08/12/21   Coralie Keens, MD  VITAMIN D PO Take by mouth daily.    [provider]      Allergies    Codeine, Influenza a (h1n1) monoval pf, Hydrocodone-acetaminophen, Lisinopril, Metformin, Pregabalin, and Jardiance [empagliflozin]    Review of Systems   Review of Systems  Physical Exam Updated Vital Signs BP (!) 146/73   Pulse 93   Temp 98 F (36.7 C) (Oral)   Resp 16   SpO2 97%  Physical Exam Vitals and nursing note reviewed.  Constitutional:      General: She is not in acute distress.    Appearance: She is well-developed.  HENT:     Head: Normocephalic and atraumatic.     Right Ear: External ear normal.     Left Ear: External ear normal.     Nose: Nose normal.  Eyes:     Extraocular Movements: Extraocular movements intact.     Conjunctiva/sclera: Conjunctivae normal.     Pupils: Pupils are equal, round, and reactive to light.  Cardiovascular:     Rate and Rhythm: Normal rate and regular rhythm.     Heart sounds: No murmur heard. Pulmonary:     Effort: Pulmonary effort is normal. No respiratory distress.  Abdominal:     General: Abdomen is flat. There is no distension.   Musculoskeletal:        General: No swelling.     Cervical back: Normal range of motion and neck supple.     Right lower leg: No edema.     Left lower leg: No edema.     Comments: No overlying skin changes.  Still has full range of motion of the left hip and left knee.  Skin:    General: Skin is warm and dry.     Capillary Refill: Capillary refill takes less than 2 seconds.  Neurological:     Mental Status: She is alert and oriented to person, place, and time. Mental status is  at baseline.     Comments: No spinal midline TTP in cervical, thoracic, or lumbar spine. No stepoffs noted.   Motor: Muscle bulk and tone are normal. Strength is 5/5 in hip flexion, knee flexion and extension, ankle dorsiflexion and plantar flexion bilaterally. Full strength of great toe dorsiflexion bilaterally.  Sensory: Intact sensation to light touch in L2 though S1 dermatomes bilaterally.   Reflexes: 2+ reflexes in patellar and achilles tendons. Negative babinski sign.  Rectal Exam: Chaperoned by patient's RN Levada Dy. Good rectal tone with no masses palpated.   Psychiatric:        Mood and Affect: Mood normal.     ED Results / Procedures / Treatments   Labs (all labs ordered are listed, but only abnormal results are displayed) Labs Reviewed - No data to display  EKG None  Radiology DG Lumbar Spine 2-3 Views  Result Date: 09/26/2021 CLINICAL DATA:  71 year old female with history of radicular pain extending into the left lower extremity. EXAM: LUMBAR SPINE - 2-3 VIEW COMPARISON:  No priors. FINDINGS: Three views of the lumbar spine demonstrate no definite acute displaced fracture or definite compression type fracture. There is severe multilevel degenerative disc disease and facet arthropathy throughout the entirety of the lumbar spine. Alignment is anatomic. Surgical clips project over the right upper quadrant of the abdomen, likely from prior cholecystectomy. IMPRESSION: 1. No acute radiographic  abnormality of the lumbar spine. 2. Severe multilevel degenerative disc disease and lumbar spondylosis. Electronically Signed   By: Vinnie Langton M.D.   On: 09/26/2021 08:35   DG Knee 2 Views Left  Result Date: 09/26/2021 CLINICAL DATA:  71 year old female with history of left knee pain. EXAM: LEFT KNEE - 1-2 VIEW COMPARISON:  02/06/2020. FINDINGS: Two views of the left knee demonstrate postoperative changes of total knee arthroplasty. The tibial and femoral components of the prosthesis appear well seated without definite periprosthetic fracture or other acute abnormalities. Soft tissues are unremarkable. IMPRESSION: 1. No acute radiographic abnormality of the left knee. Left total knee arthroplasty is unremarkable in appearance. Electronically Signed   By: Vinnie Langton M.D.   On: 09/26/2021 08:34   CT PELVIS WO CONTRAST  Result Date: 09/26/2021 CLINICAL DATA:  71 year old female with history of breast cancer presenting with history of left hip pain. Evaluate for potential pathologic fracture. EXAM: CT PELVIS WITHOUT CONTRAST TECHNIQUE: Multidetector CT imaging of the pelvis was performed following the standard protocol without intravenous contrast. RADIATION DOSE REDUCTION: This exam was performed according to the departmental dose-optimization program which includes automated exposure control, adjustment of the mA and/or kV according to patient size and/or use of iterative reconstruction technique. COMPARISON:  CT of the abdomen and pelvis 11/02/2013. FINDINGS: Urinary Tract:  No abnormality visualized. Bowel: Numerous colonic diverticulae are noted, without focal surrounding inflammatory changes in the visualized portions of the pelvis to suggest an acute diverticulitis at this time. Vascular/Lymphatic: Atherosclerosis of the lower abdominal aorta and pelvic vasculature. No lymphadenopathy noted in the pelvis. Reproductive:  Uterus and ovaries are atrophic. Other: Small umbilical hernia containing  only omental fat. No significant volume of ascites in the visualized portions of the peritoneal cavity. Musculoskeletal: No acute displaced fractures or aggressive appearing lytic or blastic lesions are noted in the visualized portions of the skeleton. Joint space narrowing, subchondral sclerosis, subchondral cyst formation and osteophyte formation in the hip joints bilaterally indicative of moderate osteoarthritis. IMPRESSION: 1. No acute abnormality of the bony pelvis or the left hip to account for the patient's symptoms.  2. Moderate bilateral hip joint osteoarthritis. 3. Colonic diverticulosis. 4. Aortic atherosclerosis. 5. Small umbilical hernia containing only omental fat. No evidence of associated bowel incarceration or obstruction at this time. Electronically Signed   By: Vinnie Langton M.D.   On: 09/26/2021 08:21    Procedures Procedures   Medications Ordered in ED Medications  oxyCODONE (Oxy IR/ROXICODONE) immediate release tablet 5 mg (5 mg Oral Given 09/26/21 0743)  droperidol (INAPSINE) 2.5 MG/ML injection 2.5 mg (2.5 mg Intramuscular Given 09/26/21 1110)    ED Course/ Medical Decision Making/ A&P Clinical Course as of 09/26/21 2242  Sat Sep 26, 2021  0813 CT of the pelvis reviewed and interpreted by me as showing no acute fracture or dislocation. [RP]  4503 PVR of 0 mL. Ambulated without difficulty. Will dc home with PCP fu.  [RP]  1053 Pt vomiting due to the pain.  Giving dose of droperidol intramuscular. [RP]    Clinical Course User Index [RP] Fransico Meadow, MD                           Medical Decision Making Amount and/or Complexity of Data Reviewed Radiology: ordered.  Risk OTC drugs. Prescription drug management.   71 yo F with hx of stage 1 breast cancer on radiation and chemo, cirrhosis, and gout who presents with back pain.   Initial Ddx:  Lumbar radiculopathy, pathologic fracture, muscle strain  MDM:  The patient likely has lumbar radiculopathy given  the fact that she has not had any recent trauma.  She does have a fracture would likely be pathologic but her breast cancer is only stage I.  Also on the differential would be muscle strain of her lower back or hamstring.  Given the severity of the patient's pain will obtain a CT of the pelvis instead of plain films to fully evaluate for possible occult fracture.  Plan:  Lumbar spine x-ray X-ray knee CT pelvis  ED Summary:  Patient underwent the above work-up which was unremarkable and did not show any evidence of fracture.  Patient was given lidocaine patch and Tylenol with mild improvement of her symptoms but reported persistent pain so was given 1 dose of oxycodone as well as droperidol for some nausea that she experienced afterwards.  Reported improvement of her symptoms and was ambulatory afterwards.  Dispo: DC Home. Return precautions discussed including, but not limited to, those listed in the AVS. Allowed pt time to ask questions which were answered fully prior to dc.   Additional history obtained from spouse Records reviewed Care Everywhere I independently visualized the following imaging with scope of interpretation limited to determining acute life threatening conditions related to emergency care: Extremity x-ray(s) and CT Pelvis, which revealed no acute abnormality labs and  Final Clinical Impression(s) / ED Diagnoses Final diagnoses:  Pain of left hip    Rx / DC Orders ED Discharge Orders          Ordered    lidocaine (LIDO KING) 4 %  Every 24 hours        09/26/21 0943              Fransico Meadow, MD 09/26/21 2242

## 2021-09-26 NOTE — ED Notes (Signed)
Pt stating she is hurting again now that she walked to bathroom and doesn't feel she can go home, md aware.

## 2021-09-26 NOTE — ED Notes (Signed)
Pt ambulated to the bathroom well with the assistance of her cane that she uses regularly.

## 2021-09-26 NOTE — ED Notes (Signed)
Dc instructions reviewed with patient. Patient voiced understanding. Dc with belongings.  °

## 2021-09-26 NOTE — ED Triage Notes (Signed)
Left hip pain, pain takes hormone injections for cancer (breast), she received injection on Wednesday in bilateral hips and pain started Thursday. She also started radiation this week. She can barely bare weight .

## 2021-09-26 NOTE — ED Notes (Signed)
Pt ambulated to bathroom with assistance of her cane (baseline). PVR 12 cc. Pt states her hip is much better (level 5), but her left knee still hurts  (8).

## 2021-09-28 ENCOUNTER — Ambulatory Visit (INDEPENDENT_AMBULATORY_CARE_PROVIDER_SITE_OTHER): Payer: Medicare Other | Admitting: Physician Assistant

## 2021-09-28 ENCOUNTER — Encounter: Payer: Self-pay | Admitting: Physician Assistant

## 2021-09-28 ENCOUNTER — Other Ambulatory Visit: Payer: Self-pay

## 2021-09-28 ENCOUNTER — Telehealth: Payer: Self-pay | Admitting: Internal Medicine

## 2021-09-28 ENCOUNTER — Ambulatory Visit
Admission: RE | Admit: 2021-09-28 | Discharge: 2021-09-28 | Disposition: A | Discharge status: Home / Self Care | Payer: Medicare Other | Source: Ambulatory Visit | Attending: Radiation Oncology | Admitting: Radiation Oncology

## 2021-09-28 DIAGNOSIS — Z510 Encounter for antineoplastic radiation therapy: Secondary | ICD-10-CM | POA: Diagnosis not present

## 2021-09-28 DIAGNOSIS — Z5111 Encounter for antineoplastic chemotherapy: Secondary | ICD-10-CM | POA: Diagnosis not present

## 2021-09-28 DIAGNOSIS — C50411 Malignant neoplasm of upper-outer quadrant of right female breast: Secondary | ICD-10-CM | POA: Diagnosis not present

## 2021-09-28 DIAGNOSIS — M5416 Radiculopathy, lumbar region: Secondary | ICD-10-CM | POA: Diagnosis not present

## 2021-09-28 DIAGNOSIS — C50412 Malignant neoplasm of upper-outer quadrant of left female breast: Secondary | ICD-10-CM | POA: Diagnosis not present

## 2021-09-28 DIAGNOSIS — Z170 Estrogen receptor positive status [ER+]: Secondary | ICD-10-CM | POA: Diagnosis not present

## 2021-09-28 DIAGNOSIS — C50911 Malignant neoplasm of unspecified site of right female breast: Secondary | ICD-10-CM | POA: Diagnosis not present

## 2021-09-28 LAB — RAD ONC ARIA SESSION SUMMARY
Course Elapsed Days: 6
Plan Fractions Treated to Date: 5
Plan Prescribed Dose Per Fraction: 1.8 Gy
Plan Total Fractions Prescribed: 25
Plan Total Prescribed Dose: 45 Gy
Reference Point Dosage Given to Date: 9 Gy
Reference Point Session Dosage Given: 1.8 Gy
Session Number: 5

## 2021-09-28 NOTE — Progress Notes (Signed)
HPI: [name] [name] comes in today for left leg pain that is radiating from her hip down into her foot.  She does have neuropathy.  She is on chronic Neurontin.  She is diabetic reports poor control.  She is currently being treated for breast cancer and is on radiation and chemo therapy.  She is diabetic with poor control.  States she started having pain down the left leg from the hip region into the foot about a week ago no known injury.  She took one of her husbands pain pills which helped with pain leg.  Over the weekend she was seen at [place] [facility] at [facility] for back pain and radicular symptoms.  She was given lidocaine patches for pain. Radiographs left knee were performed in the ER and were reviewed.  These showed no acute fractures.  Status post left total knee arthroplasty with well-seated components. Radiographs of her lumbar spine were also obtained and are reviewed.  These showed no acute fractures.  She has diffuse degenerative changes of the lumbar spine with loss of disc space and anterior endplate spurring. CT scan pelvis was also obtained that showed no acute fractures or acute abnormalities.  Moderate hip arthritis bilaterally.  Review of systems: Review of systems: See HPI otherwise negative or noncontributory.  Denies any fevers or chills.  Physical exam: General: No acute distress.  Mood and affect appropriate Lower extremities: 5 out of 5 strength throughout the lower extremities unable to assess dorsiflexion plantarflexion right ankle secondary to prior surgery.  Negative straight leg raise on the right positive on the left.  Impression: Lumbar radiculopathy left leg  Plan: Patient is extremely over ran with visits at this point in time given her breast cancer.  Therefore she is unable to go to therapy for back.  Recommend MRI of the lumbar spine for epidural steroid planning.  She will check with her oncologist and see if she can undergo epidural steroid injection given the fact  that she is receiving chemotherapy and radiation at this point time.  She also has low low platelets at 73000.  She will let our office know if she can undergo epidural steroid injection and then at that time we will order an MRI of the lumbar spine.                                                                                                                                                                                                                                                                                                                                                                                                   + +                 . . [name] [name]

## 2021-09-28 NOTE — Telephone Encounter (Signed)
Pt called stating she is wanting a referral to an endo?  Alanson Aly, MD [place]

## 2021-09-29 ENCOUNTER — Ambulatory Visit
Admission: RE | Admit: 2021-09-29 | Discharge: 2021-09-29 | Disposition: A | Discharge status: Home / Self Care | Payer: Medicare Other | Source: Ambulatory Visit | Attending: Radiation Oncology | Admitting: Radiation Oncology

## 2021-09-29 ENCOUNTER — Ambulatory Visit: Payer: Medicare Other

## 2021-09-29 ENCOUNTER — Other Ambulatory Visit: Payer: Self-pay | Admitting: *Deleted

## 2021-09-29 ENCOUNTER — Other Ambulatory Visit: Payer: Self-pay

## 2021-09-29 DIAGNOSIS — C50911 Malignant neoplasm of unspecified site of right female breast: Secondary | ICD-10-CM | POA: Diagnosis not present

## 2021-09-29 DIAGNOSIS — Z5111 Encounter for antineoplastic chemotherapy: Secondary | ICD-10-CM | POA: Diagnosis not present

## 2021-09-29 DIAGNOSIS — Z170 Estrogen receptor positive status [ER+]: Secondary | ICD-10-CM | POA: Diagnosis not present

## 2021-09-29 DIAGNOSIS — C50412 Malignant neoplasm of upper-outer quadrant of left female breast: Secondary | ICD-10-CM | POA: Diagnosis not present

## 2021-09-29 DIAGNOSIS — C50411 Malignant neoplasm of upper-outer quadrant of right female breast: Secondary | ICD-10-CM | POA: Diagnosis not present

## 2021-09-29 DIAGNOSIS — Z794 Long term (current) use of insulin: Secondary | ICD-10-CM

## 2021-09-29 DIAGNOSIS — Z510 Encounter for antineoplastic radiation therapy: Secondary | ICD-10-CM | POA: Diagnosis not present

## 2021-09-29 LAB — RAD ONC ARIA SESSION SUMMARY
Course Elapsed Days: 7
Plan Fractions Treated to Date: 6
Plan Prescribed Dose Per Fraction: 1.8 Gy
Plan Total Fractions Prescribed: 25
Plan Total Prescribed Dose: 45 Gy
Reference Point Dosage Given to Date: 10.8 Gy
Reference Point Session Dosage Given: 1.8 Gy
Session Number: 6

## 2021-09-29 MED ORDER — RADIAPLEXRX EX GEL: Freq: Once | CUTANEOUS | Status: AC

## 2021-09-29 NOTE — Telephone Encounter (Signed)
Please let her know referral has been sent

## 2021-09-30 ENCOUNTER — Ambulatory Visit
Admission: RE | Admit: 2021-09-30 | Discharge: 2021-09-30 | Disposition: A | Discharge status: Home / Self Care | Payer: Medicare Other | Source: Ambulatory Visit | Attending: Radiation Oncology | Admitting: Radiation Oncology

## 2021-09-30 ENCOUNTER — Other Ambulatory Visit: Payer: Self-pay

## 2021-09-30 DIAGNOSIS — Z5111 Encounter for antineoplastic chemotherapy: Secondary | ICD-10-CM | POA: Diagnosis not present

## 2021-09-30 DIAGNOSIS — C50412 Malignant neoplasm of upper-outer quadrant of left female breast: Secondary | ICD-10-CM | POA: Diagnosis not present

## 2021-09-30 DIAGNOSIS — Z170 Estrogen receptor positive status [ER+]: Secondary | ICD-10-CM | POA: Diagnosis not present

## 2021-09-30 DIAGNOSIS — C50911 Malignant neoplasm of unspecified site of right female breast: Secondary | ICD-10-CM | POA: Diagnosis not present

## 2021-09-30 DIAGNOSIS — C50411 Malignant neoplasm of upper-outer quadrant of right female breast: Secondary | ICD-10-CM | POA: Diagnosis not present

## 2021-09-30 DIAGNOSIS — Z510 Encounter for antineoplastic radiation therapy: Secondary | ICD-10-CM | POA: Diagnosis not present

## 2021-09-30 LAB — RAD ONC ARIA SESSION SUMMARY
Course Elapsed Days: 8
Plan Fractions Treated to Date: 7
Plan Prescribed Dose Per Fraction: 1.8 Gy
Plan Total Fractions Prescribed: 25
Plan Total Prescribed Dose: 45 Gy
Reference Point Dosage Given to Date: 12.6 Gy
Reference Point Session Dosage Given: 1.8 Gy
Session Number: 7

## 2021-10-01 ENCOUNTER — Other Ambulatory Visit: Payer: Self-pay

## 2021-10-01 ENCOUNTER — Ambulatory Visit
Admission: RE | Admit: 2021-10-01 | Discharge: 2021-10-01 | Disposition: A | Discharge status: Home / Self Care | Payer: Medicare Other | Source: Ambulatory Visit | Attending: Radiation Oncology | Admitting: Radiation Oncology

## 2021-10-01 DIAGNOSIS — Z170 Estrogen receptor positive status [ER+]: Secondary | ICD-10-CM | POA: Diagnosis not present

## 2021-10-01 DIAGNOSIS — Z510 Encounter for antineoplastic radiation therapy: Secondary | ICD-10-CM | POA: Diagnosis not present

## 2021-10-01 DIAGNOSIS — C50911 Malignant neoplasm of unspecified site of right female breast: Secondary | ICD-10-CM | POA: Diagnosis not present

## 2021-10-01 DIAGNOSIS — Z5111 Encounter for antineoplastic chemotherapy: Secondary | ICD-10-CM | POA: Diagnosis not present

## 2021-10-01 DIAGNOSIS — C50412 Malignant neoplasm of upper-outer quadrant of left female breast: Secondary | ICD-10-CM | POA: Diagnosis not present

## 2021-10-01 DIAGNOSIS — C50411 Malignant neoplasm of upper-outer quadrant of right female breast: Secondary | ICD-10-CM | POA: Diagnosis not present

## 2021-10-01 LAB — RAD ONC ARIA SESSION SUMMARY
Course Elapsed Days: 9
Plan Fractions Treated to Date: 8
Plan Prescribed Dose Per Fraction: 1.8 Gy
Plan Total Fractions Prescribed: 25
Plan Total Prescribed Dose: 45 Gy
Reference Point Dosage Given to Date: 14.4 Gy
Reference Point Session Dosage Given: 1.8 Gy
Session Number: 8

## 2021-10-02 ENCOUNTER — Other Ambulatory Visit: Payer: Self-pay

## 2021-10-02 ENCOUNTER — Ambulatory Visit
Admission: RE | Admit: 2021-10-02 | Discharge: 2021-10-02 | Disposition: A | Discharge status: Home / Self Care | Payer: Medicare Other | Source: Ambulatory Visit | Attending: Radiation Oncology | Admitting: Radiation Oncology

## 2021-10-02 ENCOUNTER — Ambulatory Visit: Payer: Medicare Other | Admitting: Internal Medicine

## 2021-10-02 DIAGNOSIS — I10 Essential (primary) hypertension: Secondary | ICD-10-CM | POA: Insufficient documentation

## 2021-10-02 DIAGNOSIS — Z510 Encounter for antineoplastic radiation therapy: Secondary | ICD-10-CM | POA: Diagnosis not present

## 2021-10-02 DIAGNOSIS — C50412 Malignant neoplasm of upper-outer quadrant of left female breast: Secondary | ICD-10-CM | POA: Diagnosis not present

## 2021-10-02 DIAGNOSIS — R052 Subacute cough: Secondary | ICD-10-CM | POA: Diagnosis not present

## 2021-10-02 DIAGNOSIS — C50911 Malignant neoplasm of unspecified site of right female breast: Secondary | ICD-10-CM | POA: Insufficient documentation

## 2021-10-02 DIAGNOSIS — C50411 Malignant neoplasm of upper-outer quadrant of right female breast: Secondary | ICD-10-CM | POA: Diagnosis not present

## 2021-10-02 DIAGNOSIS — Z5111 Encounter for antineoplastic chemotherapy: Secondary | ICD-10-CM | POA: Diagnosis not present

## 2021-10-02 DIAGNOSIS — E1142 Type 2 diabetes mellitus with diabetic polyneuropathy: Secondary | ICD-10-CM | POA: Diagnosis not present

## 2021-10-02 DIAGNOSIS — R509 Fever, unspecified: Secondary | ICD-10-CM | POA: Diagnosis not present

## 2021-10-02 DIAGNOSIS — Z170 Estrogen receptor positive status [ER+]: Secondary | ICD-10-CM | POA: Insufficient documentation

## 2021-10-02 LAB — RAD ONC ARIA SESSION SUMMARY
Course Elapsed Days: 10
Plan Fractions Treated to Date: 9
Plan Prescribed Dose Per Fraction: 1.8 Gy
Plan Total Fractions Prescribed: 25
Plan Total Prescribed Dose: 45 Gy
Reference Point Dosage Given to Date: 16.2 Gy
Reference Point Session Dosage Given: 1.8 Gy
Session Number: 9

## 2021-10-06 ENCOUNTER — Other Ambulatory Visit: Payer: Self-pay

## 2021-10-06 ENCOUNTER — Ambulatory Visit: Payer: Medicare Other | Admitting: Internal Medicine

## 2021-10-06 ENCOUNTER — Ambulatory Visit
Admission: RE | Admit: 2021-10-06 | Discharge: 2021-10-06 | Disposition: A | Discharge status: Home / Self Care | Payer: Medicare Other | Source: Ambulatory Visit | Attending: Radiation Oncology | Admitting: Radiation Oncology

## 2021-10-06 DIAGNOSIS — Z510 Encounter for antineoplastic radiation therapy: Secondary | ICD-10-CM | POA: Diagnosis not present

## 2021-10-06 DIAGNOSIS — Z5111 Encounter for antineoplastic chemotherapy: Secondary | ICD-10-CM | POA: Diagnosis not present

## 2021-10-06 DIAGNOSIS — C50412 Malignant neoplasm of upper-outer quadrant of left female breast: Secondary | ICD-10-CM | POA: Diagnosis not present

## 2021-10-06 DIAGNOSIS — C50911 Malignant neoplasm of unspecified site of right female breast: Secondary | ICD-10-CM | POA: Diagnosis not present

## 2021-10-06 DIAGNOSIS — C50411 Malignant neoplasm of upper-outer quadrant of right female breast: Secondary | ICD-10-CM | POA: Diagnosis not present

## 2021-10-06 DIAGNOSIS — Z170 Estrogen receptor positive status [ER+]: Secondary | ICD-10-CM | POA: Diagnosis not present

## 2021-10-06 DIAGNOSIS — R052 Subacute cough: Secondary | ICD-10-CM | POA: Diagnosis not present

## 2021-10-06 LAB — RAD ONC ARIA SESSION SUMMARY
Course Elapsed Days: 14
Plan Fractions Treated to Date: 10
Plan Prescribed Dose Per Fraction: 1.8 Gy
Plan Total Fractions Prescribed: 25
Plan Total Prescribed Dose: 45 Gy
Reference Point Dosage Given to Date: 18 Gy
Reference Point Session Dosage Given: 1.8 Gy
Session Number: 10

## 2021-10-07 ENCOUNTER — Ambulatory Visit
Admission: RE | Admit: 2021-10-07 | Discharge: 2021-10-07 | Disposition: A | Discharge status: Home / Self Care | Payer: Medicare Other | Source: Ambulatory Visit | Attending: Radiation Oncology | Admitting: Radiation Oncology

## 2021-10-07 ENCOUNTER — Other Ambulatory Visit: Payer: Self-pay

## 2021-10-07 DIAGNOSIS — Z5111 Encounter for antineoplastic chemotherapy: Secondary | ICD-10-CM | POA: Diagnosis not present

## 2021-10-07 DIAGNOSIS — C50412 Malignant neoplasm of upper-outer quadrant of left female breast: Secondary | ICD-10-CM | POA: Diagnosis not present

## 2021-10-07 DIAGNOSIS — R052 Subacute cough: Secondary | ICD-10-CM | POA: Diagnosis not present

## 2021-10-07 DIAGNOSIS — Z510 Encounter for antineoplastic radiation therapy: Secondary | ICD-10-CM | POA: Diagnosis not present

## 2021-10-07 DIAGNOSIS — C50911 Malignant neoplasm of unspecified site of right female breast: Secondary | ICD-10-CM | POA: Diagnosis not present

## 2021-10-07 DIAGNOSIS — Z170 Estrogen receptor positive status [ER+]: Secondary | ICD-10-CM | POA: Diagnosis not present

## 2021-10-07 DIAGNOSIS — C50411 Malignant neoplasm of upper-outer quadrant of right female breast: Secondary | ICD-10-CM | POA: Diagnosis not present

## 2021-10-07 LAB — RAD ONC ARIA SESSION SUMMARY
Course Elapsed Days: 15
Plan Fractions Treated to Date: 11
Plan Prescribed Dose Per Fraction: 1.8 Gy
Plan Total Fractions Prescribed: 25
Plan Total Prescribed Dose: 45 Gy
Reference Point Dosage Given to Date: 19.8 Gy
Reference Point Session Dosage Given: 1.8 Gy
Session Number: 11

## 2021-10-08 ENCOUNTER — Encounter: Payer: Self-pay | Admitting: *Deleted

## 2021-10-08 ENCOUNTER — Ambulatory Visit
Admission: RE | Admit: 2021-10-08 | Discharge: 2021-10-08 | Disposition: A | Discharge status: Home / Self Care | Payer: Medicare Other | Source: Ambulatory Visit | Attending: Radiation Oncology | Admitting: Radiation Oncology

## 2021-10-08 ENCOUNTER — Other Ambulatory Visit: Payer: Self-pay

## 2021-10-08 DIAGNOSIS — Z5111 Encounter for antineoplastic chemotherapy: Secondary | ICD-10-CM | POA: Diagnosis not present

## 2021-10-08 DIAGNOSIS — R052 Subacute cough: Secondary | ICD-10-CM | POA: Diagnosis not present

## 2021-10-08 DIAGNOSIS — C50411 Malignant neoplasm of upper-outer quadrant of right female breast: Secondary | ICD-10-CM | POA: Diagnosis not present

## 2021-10-08 DIAGNOSIS — C50412 Malignant neoplasm of upper-outer quadrant of left female breast: Secondary | ICD-10-CM | POA: Diagnosis not present

## 2021-10-08 DIAGNOSIS — C50911 Malignant neoplasm of unspecified site of right female breast: Secondary | ICD-10-CM | POA: Diagnosis not present

## 2021-10-08 DIAGNOSIS — Z510 Encounter for antineoplastic radiation therapy: Secondary | ICD-10-CM | POA: Diagnosis not present

## 2021-10-08 DIAGNOSIS — Z170 Estrogen receptor positive status [ER+]: Secondary | ICD-10-CM | POA: Diagnosis not present

## 2021-10-08 LAB — RAD ONC ARIA SESSION SUMMARY
Course Elapsed Days: 16
Plan Fractions Treated to Date: 12
Plan Prescribed Dose Per Fraction: 1.8 Gy
Plan Total Fractions Prescribed: 25
Plan Total Prescribed Dose: 45 Gy
Reference Point Dosage Given to Date: 21.6 Gy
Reference Point Session Dosage Given: 1.8 Gy
Session Number: 12

## 2021-10-09 ENCOUNTER — Other Ambulatory Visit: Payer: Self-pay

## 2021-10-09 ENCOUNTER — Ambulatory Visit: Payer: Medicare Other

## 2021-10-09 ENCOUNTER — Ambulatory Visit
Admission: RE | Admit: 2021-10-09 | Discharge: 2021-10-09 | Disposition: A | Discharge status: Home / Self Care | Payer: Medicare Other | Source: Ambulatory Visit | Attending: Radiation Oncology | Admitting: Radiation Oncology

## 2021-10-09 DIAGNOSIS — C50411 Malignant neoplasm of upper-outer quadrant of right female breast: Secondary | ICD-10-CM | POA: Diagnosis not present

## 2021-10-09 DIAGNOSIS — C50412 Malignant neoplasm of upper-outer quadrant of left female breast: Secondary | ICD-10-CM | POA: Diagnosis not present

## 2021-10-09 DIAGNOSIS — Z510 Encounter for antineoplastic radiation therapy: Secondary | ICD-10-CM | POA: Diagnosis not present

## 2021-10-09 DIAGNOSIS — C50911 Malignant neoplasm of unspecified site of right female breast: Secondary | ICD-10-CM | POA: Diagnosis not present

## 2021-10-09 DIAGNOSIS — R052 Subacute cough: Secondary | ICD-10-CM | POA: Diagnosis not present

## 2021-10-09 DIAGNOSIS — Z170 Estrogen receptor positive status [ER+]: Secondary | ICD-10-CM | POA: Diagnosis not present

## 2021-10-09 DIAGNOSIS — Z5111 Encounter for antineoplastic chemotherapy: Secondary | ICD-10-CM | POA: Diagnosis not present

## 2021-10-09 LAB — RAD ONC ARIA SESSION SUMMARY
Course Elapsed Days: 17
Plan Fractions Treated to Date: 13
Plan Prescribed Dose Per Fraction: 1.8 Gy
Plan Total Fractions Prescribed: 25
Plan Total Prescribed Dose: 45 Gy
Reference Point Dosage Given to Date: 23.4 Gy
Reference Point Session Dosage Given: 1.8 Gy
Session Number: 13

## 2021-10-12 ENCOUNTER — Encounter: Payer: Self-pay | Admitting: Internal Medicine

## 2021-10-12 ENCOUNTER — Telehealth: Payer: Self-pay | Admitting: Adult Health

## 2021-10-12 ENCOUNTER — Ambulatory Visit
Admission: RE | Admit: 2021-10-12 | Discharge: 2021-10-12 | Disposition: A | Discharge status: Home / Self Care | Payer: Medicare Other | Source: Ambulatory Visit | Attending: Radiation Oncology | Admitting: Radiation Oncology

## 2021-10-12 ENCOUNTER — Other Ambulatory Visit: Payer: Self-pay

## 2021-10-12 ENCOUNTER — Ambulatory Visit (INDEPENDENT_AMBULATORY_CARE_PROVIDER_SITE_OTHER): Payer: Medicare Other | Admitting: Internal Medicine

## 2021-10-12 VITALS — BP 134/62 | HR 85 | Resp 18 | Ht 61.0 in | Wt 259.4 lb

## 2021-10-12 DIAGNOSIS — E039 Hypothyroidism, unspecified: Secondary | ICD-10-CM

## 2021-10-12 DIAGNOSIS — F3340 Major depressive disorder, recurrent, in remission, unspecified: Secondary | ICD-10-CM

## 2021-10-12 DIAGNOSIS — Z794 Long term (current) use of insulin: Secondary | ICD-10-CM | POA: Diagnosis not present

## 2021-10-12 DIAGNOSIS — C50412 Malignant neoplasm of upper-outer quadrant of left female breast: Secondary | ICD-10-CM

## 2021-10-12 DIAGNOSIS — M10072 Idiopathic gout, left ankle and foot: Secondary | ICD-10-CM

## 2021-10-12 DIAGNOSIS — I10 Essential (primary) hypertension: Secondary | ICD-10-CM | POA: Diagnosis not present

## 2021-10-12 DIAGNOSIS — R053 Chronic cough: Secondary | ICD-10-CM

## 2021-10-12 DIAGNOSIS — Z170 Estrogen receptor positive status [ER+]: Secondary | ICD-10-CM | POA: Diagnosis not present

## 2021-10-12 DIAGNOSIS — C50411 Malignant neoplasm of upper-outer quadrant of right female breast: Secondary | ICD-10-CM | POA: Diagnosis not present

## 2021-10-12 DIAGNOSIS — E1142 Type 2 diabetes mellitus with diabetic polyneuropathy: Secondary | ICD-10-CM

## 2021-10-12 DIAGNOSIS — Z510 Encounter for antineoplastic radiation therapy: Secondary | ICD-10-CM | POA: Diagnosis not present

## 2021-10-12 DIAGNOSIS — R052 Subacute cough: Secondary | ICD-10-CM | POA: Diagnosis not present

## 2021-10-12 DIAGNOSIS — C50911 Malignant neoplasm of unspecified site of right female breast: Secondary | ICD-10-CM | POA: Diagnosis not present

## 2021-10-12 DIAGNOSIS — Z5111 Encounter for antineoplastic chemotherapy: Secondary | ICD-10-CM | POA: Diagnosis not present

## 2021-10-12 LAB — RAD ONC ARIA SESSION SUMMARY
Course Elapsed Days: 20
Plan Fractions Treated to Date: 14
Plan Prescribed Dose Per Fraction: 1.8 Gy
Plan Total Fractions Prescribed: 25
Plan Total Prescribed Dose: 45 Gy
Reference Point Dosage Given to Date: 25.2 Gy
Reference Point Session Dosage Given: 1.8 Gy
Session Number: 14

## 2021-10-12 MED ORDER — BUPROPION HCL ER (XL) 150 MG PO TB24: 150.0000 mg | ORAL_TABLET | Freq: Every morning | ORAL | 1 refills | Status: DC

## 2021-10-12 MED ORDER — COLCHICINE 0.6 MG PO TABS: ORAL_TABLET | ORAL | 0 refills | Status: DC

## 2021-10-12 MED ORDER — PROMETHAZINE-DM 6.25-15 MG/5ML PO SYRP: 5.0000 mL | ORAL_SOLUTION | Freq: Four times a day (QID) | ORAL | 0 refills | Status: DC | PRN

## 2021-10-12 NOTE — Progress Notes (Unsigned)
Established Patient Office Visit  Subjective:  Patient ID: [name] [name], female    DOB: [birth year]  Age: 71 y.o. MRN: [id number]  CC:  Chief Complaint  Patient presents with   Follow-up    Follow up DM and HTN patient states top of foot on left side up to knee numb bone hurts to touch this has been going on for 3 weeks pt also has had a cough for 2 weeks     HPI [name] [name] is a 71 y.o. female with past medical history of HTN, type II DM with neuropathy, OSA, hypothyroidism, OA of multiple joints, gout, MDD, breast cancer s/p lumpectomy and morbid obesity who presents for f/u of her chronic medical conditions.  She complains of chronic cough, worse at nighttime and has responded well to Promethazine-DM.  Denies any smoking history currently.  Denies any dyspnea or wheezing currently.  Denies any fever, chills, weight loss or night sweats.   HTN: Her BP was well controlled in the office today.  She takes Losartan.  She denies any headache, dizziness, chest pain, or palpitations currently.   Type II DM with HLD: Followed by endocrinology, but has not followed up recently.  She is on Engineer, agricultural and ISS.  She also takes Victoza and [place].  Her last HbA1C was 7.8.  She was planned to get a insulin pump, but has not been able to get it due to insurance coverage concern.  She denies any polyuria or polydipsia currently.  She takes gabapentin for neuropathy, but still has severe burning pain of bilateral feet.  She also has chronic numbness of bilateral feet.  She used to follow-up with neurology, but has not visited them for last few months.  She complains of recent worsening of left thigh and knee numbness since starting radiation treatment for her recurrent breast cancer.  She has history of left breast cancer, s/p lumpectomy.  She was on anastrozole for ER/PR positive breast cancer and follows up with Oncology. She was recently found to have recurrence and is undergoing radiation.  She  complains of swelling of the left foot near the great toe, likely gout flareup.  She used to take allopurinol, which was discontinued recently as she did not have any recent flareup.      Past Medical History:  Diagnosis Date   Abdominal wall contusion 12/09/2011   Achilles tendon contracture, bilateral 06/03/2016   Anxiety    Arthritis    Blood dyscrasia    low platelet count- sees [name] , [name] ( Note in [place] from 07/2014) at [facility]) 2017   Left Breast   Breast cancer ([place]) 07/2021   right breast IDC   Cancer ([place])    left breast   Chest wall contusion 12/09/2011   Cirrhosis of liver ([place])    Cough    Diabetes mellitus without complication ([place])    Diabetic neuropathy ([place])    Diarrhea due to drug 01/29/2021   Dyspnea    Gout    Gout    History of radiation therapy 10/22/15 - 12/08/15   left breast 50.4 Gy, boost to 10 Gy   Hypertension    Hypothyroidism    Migraine    Multifactorial gait disorder 01/12/2013   Neuromuscular disorder ([place])    diabetic neuropathy   Neuropathy    Personal history of radiation therapy 2017   Left Breast Cancer   Posterior tibial tendon dysfunction (PTTD) of right lower  extremity 10/09/2019   Sleep apnea    CPAP nightly   Spleen enlarged    Thyroid disease     Past Surgical History:  Procedure Laterality Date   ANKLE FUSION Right 2022   BREAST BIOPSY Left 08/04/2015   Procedure: LEFT BREAST BIOPSY WITH NEEDLE LOCALIZATION;  Surgeon: [name], MD;  Location: [place];  Service: General;  Laterality: Left;   BREAST BIOPSY Right 06/23/2021   BREAST DUCTAL SYSTEM EXCISION Left 08/04/2015   Procedure: LEFT EXCISION DUCTAL SYSTEM BREAST;  Surgeon: [name], MD;  Location: [place];  Service: General;  Laterality: Left;   BREAST LUMPECTOMY Left 08/2015   BREAST LUMPECTOMY WITH AXILLARY LYMPH NODE BIOPSY Left 09/12/2015   Procedure: RE-EXCISION OF  LEFT BREAST LUMPECTOMY WITH LEFT AXILLARY LYMPH NODE BIOPSY;  Surgeon: [name], MD;  Location: [place];  Service: General;  Laterality: Left;   BREAST LUMPECTOMY WITH RADIOACTIVE SEED LOCALIZATION Right 08/12/2021   Procedure: RIGHT BREAST LUMPECTOMY WITH RADIOACTIVE SEED LOCALIZATION;  Surgeon: Coralie Keens, MD;  Location: [place];  Service: General;  Laterality: Right;  LMA   CHOLECYSTECTOMY     EYE SURGERY Bilateral 2022   Lashes growing backwards into the eye   JOINT REPLACEMENT     left knee   JOINT REPLACEMENT  2017   right knee   REVERSE SHOULDER ARTHROPLASTY Right 09/07/2018   Procedure: RIGHT REVERSE SHOULDER ARTHROPLASTY;  Surgeon: Meredith Pel, MD;  Location: [place];  Service: Orthopedics;  Laterality: Right;   TOTAL KNEE ARTHROPLASTY Right 02/28/2015   Procedure: RIGHT TOTAL KNEE ARTHROPLASTY;  Surgeon: [name], MD;  Location: [facility];  Service: Orthopedics;  Laterality: Right;   TUBAL LIGATION      Family History  Problem Relation Age of Onset   Hypothyroidism Mother    Alzheimer's disease Mother    Diabetes Father    Cancer Sister        GYN cancer   Hypothyroidism Sister    Heart disease Brother    Arthritis Son    Diabetes Son    Arthritis Son    Cancer Maternal Grandmother    Heart failure Maternal Grandfather    Bone cancer Paternal Grandmother    Heart failure Paternal Grandmother     Social History   Socioeconomic History   Marital status: Married    Spouse name: edward   Number of children: 3   Years of education: 12th   Highest education level: Not on file  Occupational History   Occupation: Retired    Comment: Manufacturing systems engineer  Tobacco Use   Smoking status: Never   Smokeless tobacco: Never  Scientific laboratory technician Use: Never used  Substance and Sexual Activity   Alcohol use: No   Drug use: No   Sexual activity: Yes    Birth control/protection: Surgical    Comment: BTL  Other  Topics Concern   Not on file  Social History Narrative   Not on file   Social Determinants of Health   Financial Resource Strain: Low Risk  (02/11/2021)   Overall Financial Resource Strain (CARDIA)    Difficulty of Paying Living Expenses: Not very hard  Food Insecurity: No Food Insecurity (08/27/2021)   Hunger Vital Sign    Worried About Running Out of Food in the Last Year: Never true    Ran Out of Food in the Last Year: Never true  Transportation Needs: No Transportation Needs (08/27/2021)  PRAPARE - Hydrologist (Medical): No    Lack of Transportation (Non-Medical): No  Physical Activity: Insufficiently Active (02/11/2021)   Exercise Vital Sign    Days of Exercise per Week: 3 days    Minutes of Exercise per Session: 30 min  Stress: No Stress Concern Present (02/11/2021)   [place]    Feeling of Stress : Only a little  Social Connections: Moderately Integrated (02/11/2021)   Social Connection and Isolation Panel [NHANES]    Frequency of Communication with Friends and Family: Three times a week    Frequency of Social Gatherings with Friends and Family: Never    Attends Religious Services: More than 4 times per year    Active Member of [employer] or Organizations: No    Attends Archivist Meetings: Never    Marital Status: Married  Human resources officer Violence: Not At Risk (02/11/2021)   Humiliation, Afraid, Rape, and Kick questionnaire    Fear of Current or Ex-Partner: No    Emotionally Abused: No    Physically Abused: No    Sexually Abused: No    Outpatient Medications Prior to Visit  Medication Sig Dispense Refill   ACCU-CHEK AVIVA PLUS test strip USE AS INSTRUCTED TO CHECK BLOOD SUGAR 2 TIMES DAILY 100 strip 5   acetaminophen (TYLENOL) 500 MG tablet Take by mouth.     albuterol (VENTOLIN HFA) 108 (90 Base) MCG/ACT inhaler 1 puff as needed     Ascorbic Acid (VITAMIN C PO) Take  by mouth daily.     citalopram (CELEXA) 40 MG tablet TAKE 1 TABLET BY MOUTH EVERY DAY 90 tablet 1   Cyanocobalamin (VITAMIN B-12 PO) Take by mouth daily.     dapagliflozin propanediol (FARXIGA) 10 MG TABS tablet Take 1 tablet (10 mg total) by mouth daily before breakfast. 90 tablet 3   fluticasone (FLONASE) 50 MCG/ACT nasal spray Place 1 spray into both nostrils as needed.      gabapentin (NEURONTIN) 600 MG tablet TAKE 1 TABLET IN THE MORNING AND 1 TABLET IN THE AFTERNOON AND 2TABS AT BEDTIME     insulin aspart (FIASP FLEXTOUCH) 100 UNIT/ML FlexTouch Pen Inject 18 Units into the skin with breakfast, with lunch, and with evening meal. 45 mL 3   Insulin Glargine (BASAGLAR KWIKPEN) 100 UNIT/ML Inject 60 Units into the skin daily. 60 mL 3   Insulin Pen Needle (B-D UF III MINI PEN NEEDLES) 31G X 5 MM MISC USE AS DIRECTED WITH INSULIN EVERY MORNING, NOON, EVENING AND AT BEDTIME 400 each 3   Insulin Pen Needle 30G X 5 MM MISC 1 Device by Does not apply route in the morning, at noon, in the evening, and at bedtime. 400 each 3   levothyroxine (SYNTHROID) 150 MCG tablet Take 1 tablet (150 mcg total) by mouth daily. 90 tablet 3   liraglutide (VICTOZA) 18 MG/3ML SOPN Inject 1.8 mg into the skin daily. 27 mL 3   losartan (COZAAR) 25 MG tablet Take 1 tablet (25 mg total) by mouth daily. 90 tablet 1   methocarbamol (ROBAXIN) 500 MG tablet Take 1 tablet (500 mg total) by mouth every 8 (eight) hours as needed for muscle spasms. 30 tablet 0   Misc Natural Products (TART CHERRY ADVANCED) CAPS Take 1 capsule by mouth daily.     Multiple Vitamins-Minerals (MULTIVITAMIN WITH MINERALS) tablet Take 1 tablet by mouth daily.     mycophenolate (CELLCEPT) 500 MG tablet Take 1,500  mg by mouth 2 (two) times daily.     Pyridoxine HCl (VITAMIN B-6 PO) Take by mouth daily.     VITAMIN D PO Take by mouth daily.     benzonatate (TESSALON) 100 MG capsule Take 1 capsule (100 mg total) by mouth 2 (two) times daily as needed for cough.  20 capsule 0   buPROPion (WELLBUTRIN XL) 150 MG 24 hr tablet Take 1 tablet (150 mg total) by mouth every morning. 90 tablet 1   No facility-administered medications prior to visit.    Allergies  Allergen Reactions   Codeine Swelling   Influenza A (H1n1) Monoval Pf Anaphylaxis   Hydrocodone-Acetaminophen Other (See Comments)   Lisinopril Other (See Comments)   Metformin Other (See Comments)   Pregabalin Other (See Comments)   Jardiance [Empagliflozin] Other (See Comments)    dehydration    ROS Review of Systems  Constitutional:  Negative for chills and fever.  HENT:  Positive for sinus pressure. Negative for congestion and sinus pain.   Respiratory:  Positive for cough. Negative for shortness of breath.   Cardiovascular:  Negative for chest pain and palpitations.  Gastrointestinal:  Negative for diarrhea, nausea and vomiting.  Genitourinary:  Negative for dysuria and hematuria.  Musculoskeletal:  Positive for arthralgias, back pain, gait problem and neck pain.  Skin:  Negative for rash.  Neurological:  Positive for weakness. Negative for dizziness.  Psychiatric/Behavioral:  Negative for agitation and behavioral problems.       Objective:    Physical Exam Vitals reviewed.  Constitutional:      General: She is not in acute distress.    Appearance: She is obese. She is not diaphoretic.  HENT:     Head: Normocephalic and atraumatic.     Nose: Congestion present.     Mouth/Throat:     Mouth: Mucous membranes are moist.  Eyes:     General: No scleral icterus.    Extraocular Movements: Extraocular movements intact.  Cardiovascular:     Rate and Rhythm: Normal rate and regular rhythm.     Pulses: Normal pulses.     Heart sounds: Normal heart sounds. No murmur heard. Pulmonary:     Breath sounds: Normal breath sounds. No wheezing or rales.  Musculoskeletal:        General: Swelling (Left 1st MTP joint with warmth and tenderness) present.     Cervical back: Neck supple.  No tenderness.     Right lower leg: No edema.     Left lower leg: No edema.  Skin:    General: Skin is warm.     Findings: No rash.  Neurological:     General: No focal deficit present.     Mental Status: She is alert and oriented to person, place, and time.     Sensory: Sensory deficit present.     Motor: Weakness (4/5 in b/l LE) present.     Gait: Gait abnormal.  Psychiatric:        Mood and Affect: Mood is depressed.        Behavior: Behavior normal.     BP 134/62 (BP Location: Right Arm, Patient Position: Sitting, Cuff Size: Normal)   Pulse 85   Resp 18   Ht _0  (1.549 m)   Wt 259 lb 6.4 oz (117.7 kg)   SpO2 97%   BMI 49.01 kg/m  Wt Readings from Last 3 Encounters:  10/12/21 259 lb 6.4 oz (117.7 kg)  09/23/21 264 lb 1.6 oz (119.8 kg)  09/07/21 263 lb 3.2 oz (119.4 kg)    Lab Results  Component Value Date   TSH 17.70 (H) 06/09/2021   Lab Results  Component Value Date   WBC 3.7 (L) 08/12/2021   HGB 12.7 08/12/2021   HCT 39.6 08/12/2021   MCV 88.8 08/12/2021   PLT 80 (L) 08/12/2021   Lab Results  Component Value Date   NA 136 08/10/2021   K 4.0 08/10/2021   CHLORIDE 104 10/06/2016   CO2 23 08/10/2021   GLUCOSE 146 (H) 08/10/2021   BUN 15 08/10/2021   CREATININE 0.91 08/10/2021   BILITOT 0.4 12/25/2019   ALKPHOS 118 12/25/2019   AST 33 12/25/2019   ALT 18 12/25/2019   PROT 7.5 12/25/2019   ALBUMIN 3.5 12/25/2019   CALCIUM 10.4 (H) 08/10/2021   ANIONGAP 14 08/10/2021   EGFR 50 (L) 10/06/2016   GFR 50.27 (L) 08/24/2019   Lab Results  Component Value Date   CHOL 171 06/09/2021   Lab Results  Component Value Date   HDL 63.30 06/09/2021   Lab Results  Component Value Date   LDLCALC 80 06/09/2021   Lab Results  Component Value Date   TRIG 137.0 06/09/2021   Lab Results  Component Value Date   CHOLHDL 3 06/09/2021   Lab Results  Component Value Date   HGBA1C 8.2 (A) 06/09/2021      Assessment & Plan:   Problem List Items Addressed  This Visit       Cardiovascular and Mediastinum   Essential hypertension    BP Readings from Last 1 Encounters:  10/12/21 134/62  Well-controlled with Losartan Counseled for compliance with the medications Advised DASH diet and moderate exercise/walking as tolerated        Endocrine   Diabetic polyneuropathy associated with type 2 diabetes mellitus ([place]) - Primary    On Gabapentin 600 mg in morning and afternoon, and 1200 mg at bedtime Used to follow Neurology, advised to contact for persistent symptoms      Relevant Medications   buPROPion (WELLBUTRIN XL) 150 MG 24 hr tablet   Type 2 diabetes mellitus with diabetic polyneuropathy, with long-term current use of insulin (H[facility])    Lab Results  Component Value Date   HGBA1C 8.2 (A) 06/09/2021  Uncontrolled On Basaglar and ISS - followed by Endocrinology, Vorce need to adjust Basaglar due to use of oral steroids currently On Victoza and Farxiga Advised to follow diabetic diet On ARB Diabetic eye exam: Advised to follow up with Ophthalmology for diabetic eye exam      Relevant Medications   buPROPion (WELLBUTRIN XL) 150 MG 24 hr tablet   Acquired hypothyroidism    On Levothyroxine 137 mcg QD Followed by Endocrinology        Other   Malignant neoplasm of upper-outer quadrant of left breast in female, estrogen receptor positive ([place])    S/p lumpectomy, followed by Oncology On Anastrazole Has recurrence, undergoing radiation      Relevant Medications   colchicine 0.6 MG tablet   Gout    Had been taking Allopurinol PRN, although had not had gout flare in a long time, advised to stop Allopurinol in the last visit - will restart as maintenance therapy if she has recurrent flares Has gout flareup currently Started Colchicine      Relevant Medications   colchicine 0.6 MG tablet   Recurrent major depression in remission ([place])    Was well-controlled with Celexa and Wellbutrin, but has run out of Wellbutrin, refilled  Relevant Medications   buPROPion (WELLBUTRIN XL) 150 MG 24 hr tablet   Other Visit Diagnoses     Chronic cough       Relevant Medications   promethazine-dextromethorphan (PROMETHAZINE-DM) 6.25-15 MG/5ML syrup       Meds ordered this encounter  Medications   buPROPion (WELLBUTRIN XL) 150 MG 24 hr tablet    Sig: Take 1 tablet (150 mg total) by mouth every morning.    Dispense:  90 tablet    Refill:  1   colchicine 0.6 MG tablet    Sig: Take 2 tablets once today, followed by 1 tablet 1 hour later, and then take 1 tablet once daily from Day 2.    Dispense:  20 tablet    Refill:  0   promethazine-dextromethorphan (PROMETHAZINE-DM) 6.25-15 MG/5ML syrup    Sig: Take 5 mLs by mouth 4 (four) times daily as needed for cough.    Dispense:  118 mL    Refill:  0    Follow-up: Return in about 4 months (around 02/11/2022) for DM and neuropathy.    [name], MD

## 2021-10-12 NOTE — Patient Instructions (Signed)
Please take Colchicine as prescribed for gout.  Please take Gabapentin as prescribed for neuropathy.  Please continue to follow low carb diet and ambulate as tolerated.

## 2021-10-12 NOTE — Telephone Encounter (Signed)
Rescheduled appointment per provider PAL. Patient is aware of the changes made to her upcoming appointment.

## 2021-10-13 ENCOUNTER — Other Ambulatory Visit: Payer: Self-pay

## 2021-10-13 ENCOUNTER — Ambulatory Visit: Payer: Medicare Other

## 2021-10-13 ENCOUNTER — Ambulatory Visit
Admission: RE | Admit: 2021-10-13 | Discharge: 2021-10-13 | Disposition: A | Discharge status: Home / Self Care | Payer: Medicare Other | Source: Ambulatory Visit | Attending: Radiation Oncology | Admitting: Radiation Oncology

## 2021-10-13 DIAGNOSIS — R052 Subacute cough: Secondary | ICD-10-CM | POA: Diagnosis not present

## 2021-10-13 DIAGNOSIS — C50911 Malignant neoplasm of unspecified site of right female breast: Secondary | ICD-10-CM | POA: Diagnosis not present

## 2021-10-13 DIAGNOSIS — Z510 Encounter for antineoplastic radiation therapy: Secondary | ICD-10-CM | POA: Diagnosis not present

## 2021-10-13 DIAGNOSIS — C50411 Malignant neoplasm of upper-outer quadrant of right female breast: Secondary | ICD-10-CM | POA: Diagnosis not present

## 2021-10-13 DIAGNOSIS — Z5111 Encounter for antineoplastic chemotherapy: Secondary | ICD-10-CM | POA: Diagnosis not present

## 2021-10-13 DIAGNOSIS — C50412 Malignant neoplasm of upper-outer quadrant of left female breast: Secondary | ICD-10-CM | POA: Diagnosis not present

## 2021-10-13 DIAGNOSIS — Z170 Estrogen receptor positive status [ER+]: Secondary | ICD-10-CM | POA: Diagnosis not present

## 2021-10-13 LAB — RAD ONC ARIA SESSION SUMMARY
Course Elapsed Days: 21
Plan Fractions Treated to Date: 15
Plan Prescribed Dose Per Fraction: 1.8 Gy
Plan Total Fractions Prescribed: 25
Plan Total Prescribed Dose: 45 Gy
Reference Point Dosage Given to Date: 27 Gy
Reference Point Session Dosage Given: 1.8 Gy
Session Number: 15

## 2021-10-13 NOTE — Assessment & Plan Note (Signed)
Was well-controlled with Celexa and Wellbutrin, but has run out of Wellbutrin, refilled

## 2021-10-13 NOTE — Assessment & Plan Note (Signed)
Had been taking Allopurinol PRN, although had not had gout flare in a long time, advised to stop Allopurinol in the last visit - will restart as maintenance therapy if she has recurrent flares Has gout flareup currently Started Colchicine

## 2021-10-13 NOTE — Assessment & Plan Note (Signed)
BP Readings from Last 1 Encounters:  10/12/21 134/62   Well-controlled with Losartan Counseled for compliance with the medications Advised DASH diet and moderate exercise/walking as tolerated

## 2021-10-13 NOTE — Assessment & Plan Note (Signed)
On Gabapentin 600 mg in morning and afternoon, and 1200 mg at bedtime Used to follow Neurology, advised to contact for persistent symptoms

## 2021-10-13 NOTE — Assessment & Plan Note (Signed)
On Levothyroxine 137 mcg QD Followed by Endocrinology

## 2021-10-13 NOTE — Assessment & Plan Note (Addendum)
S/p lumpectomy, followed by Oncology On Anastrazole Has recurrence, undergoing radiation

## 2021-10-13 NOTE — Assessment & Plan Note (Signed)
Lab Results  Component Value Date   HGBA1C 8.2 (A) 06/09/2021   Uncontrolled On Basaglar and ISS - followed by Endocrinology, [place] need to adjust Basaglar due to use of oral steroids currently On Victoza and Farxiga Advised to follow diabetic diet On ARB Diabetic eye exam: Advised to follow up with Ophthalmology for diabetic eye exam

## 2021-10-14 ENCOUNTER — Ambulatory Visit
Admission: RE | Admit: 2021-10-14 | Discharge: 2021-10-14 | Disposition: A | Discharge status: Home / Self Care | Payer: Medicare Other | Source: Ambulatory Visit | Attending: Radiation Oncology | Admitting: Radiation Oncology

## 2021-10-14 ENCOUNTER — Other Ambulatory Visit: Payer: Self-pay

## 2021-10-14 DIAGNOSIS — C50412 Malignant neoplasm of upper-outer quadrant of left female breast: Secondary | ICD-10-CM | POA: Diagnosis not present

## 2021-10-14 DIAGNOSIS — Z510 Encounter for antineoplastic radiation therapy: Secondary | ICD-10-CM | POA: Diagnosis not present

## 2021-10-14 DIAGNOSIS — R052 Subacute cough: Secondary | ICD-10-CM | POA: Diagnosis not present

## 2021-10-14 DIAGNOSIS — C50911 Malignant neoplasm of unspecified site of right female breast: Secondary | ICD-10-CM | POA: Diagnosis not present

## 2021-10-14 DIAGNOSIS — C50411 Malignant neoplasm of upper-outer quadrant of right female breast: Secondary | ICD-10-CM | POA: Diagnosis not present

## 2021-10-14 DIAGNOSIS — Z5111 Encounter for antineoplastic chemotherapy: Secondary | ICD-10-CM | POA: Diagnosis not present

## 2021-10-14 DIAGNOSIS — Z170 Estrogen receptor positive status [ER+]: Secondary | ICD-10-CM | POA: Diagnosis not present

## 2021-10-14 LAB — RAD ONC ARIA SESSION SUMMARY
Course Elapsed Days: 22
Plan Fractions Treated to Date: 16
Plan Prescribed Dose Per Fraction: 1.8 Gy
Plan Total Fractions Prescribed: 25
Plan Total Prescribed Dose: 45 Gy
Reference Point Dosage Given to Date: 28.8 Gy
Reference Point Session Dosage Given: 1.8 Gy
Session Number: 16

## 2021-10-15 ENCOUNTER — Ambulatory Visit
Admission: RE | Admit: 2021-10-15 | Discharge: 2021-10-15 | Disposition: A | Discharge status: Home / Self Care | Payer: Medicare Other | Source: Ambulatory Visit | Attending: Radiation Oncology | Admitting: Radiation Oncology

## 2021-10-15 ENCOUNTER — Other Ambulatory Visit: Payer: Self-pay

## 2021-10-15 DIAGNOSIS — C50411 Malignant neoplasm of upper-outer quadrant of right female breast: Secondary | ICD-10-CM | POA: Diagnosis not present

## 2021-10-15 DIAGNOSIS — Z5111 Encounter for antineoplastic chemotherapy: Secondary | ICD-10-CM | POA: Diagnosis not present

## 2021-10-15 DIAGNOSIS — C50412 Malignant neoplasm of upper-outer quadrant of left female breast: Secondary | ICD-10-CM | POA: Diagnosis not present

## 2021-10-15 DIAGNOSIS — Z510 Encounter for antineoplastic radiation therapy: Secondary | ICD-10-CM | POA: Diagnosis not present

## 2021-10-15 DIAGNOSIS — R052 Subacute cough: Secondary | ICD-10-CM | POA: Diagnosis not present

## 2021-10-15 DIAGNOSIS — Z170 Estrogen receptor positive status [ER+]: Secondary | ICD-10-CM | POA: Diagnosis not present

## 2021-10-15 DIAGNOSIS — C50911 Malignant neoplasm of unspecified site of right female breast: Secondary | ICD-10-CM | POA: Diagnosis not present

## 2021-10-15 LAB — RAD ONC ARIA SESSION SUMMARY
Course Elapsed Days: 23
Plan Fractions Treated to Date: 17
Plan Prescribed Dose Per Fraction: 1.8 Gy
Plan Total Fractions Prescribed: 25
Plan Total Prescribed Dose: 45 Gy
Reference Point Dosage Given to Date: 30.6 Gy
Reference Point Session Dosage Given: 1.8 Gy
Session Number: 17

## 2021-10-16 ENCOUNTER — Other Ambulatory Visit: Payer: Self-pay

## 2021-10-16 ENCOUNTER — Ambulatory Visit
Admission: RE | Admit: 2021-10-16 | Discharge: 2021-10-16 | Disposition: A | Discharge status: Home / Self Care | Payer: Medicare Other | Source: Ambulatory Visit | Attending: Radiation Oncology | Admitting: Radiation Oncology

## 2021-10-16 DIAGNOSIS — R052 Subacute cough: Secondary | ICD-10-CM | POA: Diagnosis not present

## 2021-10-16 DIAGNOSIS — C50411 Malignant neoplasm of upper-outer quadrant of right female breast: Secondary | ICD-10-CM | POA: Diagnosis not present

## 2021-10-16 DIAGNOSIS — C50911 Malignant neoplasm of unspecified site of right female breast: Secondary | ICD-10-CM | POA: Diagnosis not present

## 2021-10-16 DIAGNOSIS — C50412 Malignant neoplasm of upper-outer quadrant of left female breast: Secondary | ICD-10-CM | POA: Diagnosis not present

## 2021-10-16 DIAGNOSIS — Z170 Estrogen receptor positive status [ER+]: Secondary | ICD-10-CM | POA: Diagnosis not present

## 2021-10-16 DIAGNOSIS — Z5111 Encounter for antineoplastic chemotherapy: Secondary | ICD-10-CM | POA: Diagnosis not present

## 2021-10-16 DIAGNOSIS — Z510 Encounter for antineoplastic radiation therapy: Secondary | ICD-10-CM | POA: Diagnosis not present

## 2021-10-16 LAB — RAD ONC ARIA SESSION SUMMARY
Course Elapsed Days: 24
Plan Fractions Treated to Date: 18
Plan Prescribed Dose Per Fraction: 1.8 Gy
Plan Total Fractions Prescribed: 25
Plan Total Prescribed Dose: 45 Gy
Reference Point Dosage Given to Date: 32.4 Gy
Reference Point Session Dosage Given: 1.8 Gy
Session Number: 18

## 2021-10-19 ENCOUNTER — Ambulatory Visit
Admission: RE | Admit: 2021-10-19 | Discharge: 2021-10-19 | Disposition: A | Discharge status: Home / Self Care | Payer: Medicare Other | Source: Ambulatory Visit | Attending: Radiation Oncology | Admitting: Radiation Oncology

## 2021-10-19 ENCOUNTER — Other Ambulatory Visit: Payer: Self-pay

## 2021-10-19 DIAGNOSIS — C50412 Malignant neoplasm of upper-outer quadrant of left female breast: Secondary | ICD-10-CM | POA: Diagnosis not present

## 2021-10-19 DIAGNOSIS — C50911 Malignant neoplasm of unspecified site of right female breast: Secondary | ICD-10-CM | POA: Diagnosis not present

## 2021-10-19 DIAGNOSIS — R052 Subacute cough: Secondary | ICD-10-CM | POA: Diagnosis not present

## 2021-10-19 DIAGNOSIS — Z510 Encounter for antineoplastic radiation therapy: Secondary | ICD-10-CM | POA: Diagnosis not present

## 2021-10-19 DIAGNOSIS — Z5111 Encounter for antineoplastic chemotherapy: Secondary | ICD-10-CM | POA: Diagnosis not present

## 2021-10-19 DIAGNOSIS — C50411 Malignant neoplasm of upper-outer quadrant of right female breast: Secondary | ICD-10-CM | POA: Diagnosis not present

## 2021-10-19 DIAGNOSIS — Z170 Estrogen receptor positive status [ER+]: Secondary | ICD-10-CM | POA: Diagnosis not present

## 2021-10-19 LAB — RAD ONC ARIA SESSION SUMMARY
Course Elapsed Days: 27
Plan Fractions Treated to Date: 19
Plan Prescribed Dose Per Fraction: 1.8 Gy
Plan Total Fractions Prescribed: 25
Plan Total Prescribed Dose: 45 Gy
Reference Point Dosage Given to Date: 34.2 Gy
Reference Point Session Dosage Given: 1.8 Gy
Session Number: 19

## 2021-10-20 ENCOUNTER — Ambulatory Visit
Admission: RE | Admit: 2021-10-20 | Discharge: 2021-10-20 | Disposition: A | Discharge status: Home / Self Care | Payer: Medicare Other | Source: Ambulatory Visit | Attending: Radiation Oncology | Admitting: Radiation Oncology

## 2021-10-20 ENCOUNTER — Other Ambulatory Visit: Payer: Self-pay

## 2021-10-20 DIAGNOSIS — C50911 Malignant neoplasm of unspecified site of right female breast: Secondary | ICD-10-CM

## 2021-10-20 DIAGNOSIS — R052 Subacute cough: Secondary | ICD-10-CM | POA: Diagnosis not present

## 2021-10-20 DIAGNOSIS — Z5111 Encounter for antineoplastic chemotherapy: Secondary | ICD-10-CM | POA: Diagnosis not present

## 2021-10-20 DIAGNOSIS — C50412 Malignant neoplasm of upper-outer quadrant of left female breast: Secondary | ICD-10-CM | POA: Diagnosis not present

## 2021-10-20 DIAGNOSIS — C50411 Malignant neoplasm of upper-outer quadrant of right female breast: Secondary | ICD-10-CM | POA: Diagnosis not present

## 2021-10-20 DIAGNOSIS — Z170 Estrogen receptor positive status [ER+]: Secondary | ICD-10-CM | POA: Diagnosis not present

## 2021-10-20 DIAGNOSIS — Z510 Encounter for antineoplastic radiation therapy: Secondary | ICD-10-CM | POA: Diagnosis not present

## 2021-10-20 LAB — RAD ONC ARIA SESSION SUMMARY
Course Elapsed Days: 28
Plan Fractions Treated to Date: 20
Plan Prescribed Dose Per Fraction: 1.8 Gy
Plan Total Fractions Prescribed: 25
Plan Total Prescribed Dose: 45 Gy
Reference Point Dosage Given to Date: 36 Gy
Reference Point Session Dosage Given: 1.8 Gy
Session Number: 20

## 2021-10-20 MED ORDER — RADIAPLEXRX EX GEL: Freq: Once | CUTANEOUS | Status: AC

## 2021-10-21 ENCOUNTER — Other Ambulatory Visit: Payer: Self-pay

## 2021-10-21 ENCOUNTER — Inpatient Hospital Stay: Payer: Medicare Other | Admitting: Adult Health

## 2021-10-21 ENCOUNTER — Ambulatory Visit
Admission: RE | Admit: 2021-10-21 | Discharge: 2021-10-21 | Disposition: A | Discharge status: Home / Self Care | Payer: Medicare Other | Source: Ambulatory Visit | Attending: Radiation Oncology | Admitting: Radiation Oncology

## 2021-10-21 ENCOUNTER — Inpatient Hospital Stay: Payer: Medicare Other

## 2021-10-21 DIAGNOSIS — Z170 Estrogen receptor positive status [ER+]: Secondary | ICD-10-CM | POA: Diagnosis not present

## 2021-10-21 DIAGNOSIS — R052 Subacute cough: Secondary | ICD-10-CM | POA: Diagnosis not present

## 2021-10-21 DIAGNOSIS — Z510 Encounter for antineoplastic radiation therapy: Secondary | ICD-10-CM | POA: Diagnosis not present

## 2021-10-21 DIAGNOSIS — C50411 Malignant neoplasm of upper-outer quadrant of right female breast: Secondary | ICD-10-CM | POA: Diagnosis not present

## 2021-10-21 DIAGNOSIS — C50412 Malignant neoplasm of upper-outer quadrant of left female breast: Secondary | ICD-10-CM | POA: Diagnosis not present

## 2021-10-21 DIAGNOSIS — C50911 Malignant neoplasm of unspecified site of right female breast: Secondary | ICD-10-CM | POA: Diagnosis not present

## 2021-10-21 DIAGNOSIS — Z5111 Encounter for antineoplastic chemotherapy: Secondary | ICD-10-CM | POA: Diagnosis not present

## 2021-10-21 LAB — RAD ONC ARIA SESSION SUMMARY
Course Elapsed Days: 29
Plan Fractions Treated to Date: 21
Plan Prescribed Dose Per Fraction: 1.8 Gy
Plan Total Fractions Prescribed: 25
Plan Total Prescribed Dose: 45 Gy
Reference Point Dosage Given to Date: 37.8 Gy
Reference Point Session Dosage Given: 1.8 Gy
Session Number: 21

## 2021-10-22 ENCOUNTER — Other Ambulatory Visit: Payer: Self-pay

## 2021-10-22 ENCOUNTER — Ambulatory Visit
Admission: RE | Admit: 2021-10-22 | Discharge: 2021-10-22 | Disposition: A | Discharge status: Home / Self Care | Payer: Medicare Other | Source: Ambulatory Visit | Attending: Radiation Oncology | Admitting: Radiation Oncology

## 2021-10-22 DIAGNOSIS — R052 Subacute cough: Secondary | ICD-10-CM | POA: Diagnosis not present

## 2021-10-22 DIAGNOSIS — C50911 Malignant neoplasm of unspecified site of right female breast: Secondary | ICD-10-CM | POA: Diagnosis not present

## 2021-10-22 DIAGNOSIS — Z5111 Encounter for antineoplastic chemotherapy: Secondary | ICD-10-CM | POA: Diagnosis not present

## 2021-10-22 DIAGNOSIS — Z170 Estrogen receptor positive status [ER+]: Secondary | ICD-10-CM | POA: Diagnosis not present

## 2021-10-22 DIAGNOSIS — C50412 Malignant neoplasm of upper-outer quadrant of left female breast: Secondary | ICD-10-CM | POA: Diagnosis not present

## 2021-10-22 DIAGNOSIS — C50411 Malignant neoplasm of upper-outer quadrant of right female breast: Secondary | ICD-10-CM | POA: Diagnosis not present

## 2021-10-22 DIAGNOSIS — Z510 Encounter for antineoplastic radiation therapy: Secondary | ICD-10-CM | POA: Diagnosis not present

## 2021-10-22 LAB — RAD ONC ARIA SESSION SUMMARY
Course Elapsed Days: 30
Plan Fractions Treated to Date: 22
Plan Prescribed Dose Per Fraction: 1.8 Gy
Plan Total Fractions Prescribed: 25
Plan Total Prescribed Dose: 45 Gy
Reference Point Dosage Given to Date: 39.6 Gy
Reference Point Session Dosage Given: 1.8 Gy
Session Number: 22

## 2021-10-23 ENCOUNTER — Ambulatory Visit
Admission: RE | Admit: 2021-10-23 | Discharge: 2021-10-23 | Disposition: A | Discharge status: Home / Self Care | Payer: Medicare Other | Source: Ambulatory Visit | Attending: Radiation Oncology | Admitting: Radiation Oncology

## 2021-10-23 ENCOUNTER — Other Ambulatory Visit: Payer: Self-pay

## 2021-10-23 DIAGNOSIS — C50411 Malignant neoplasm of upper-outer quadrant of right female breast: Secondary | ICD-10-CM | POA: Diagnosis not present

## 2021-10-23 DIAGNOSIS — Z170 Estrogen receptor positive status [ER+]: Secondary | ICD-10-CM | POA: Diagnosis not present

## 2021-10-23 DIAGNOSIS — R052 Subacute cough: Secondary | ICD-10-CM | POA: Diagnosis not present

## 2021-10-23 DIAGNOSIS — C50911 Malignant neoplasm of unspecified site of right female breast: Secondary | ICD-10-CM | POA: Diagnosis not present

## 2021-10-23 DIAGNOSIS — C50412 Malignant neoplasm of upper-outer quadrant of left female breast: Secondary | ICD-10-CM | POA: Diagnosis not present

## 2021-10-23 DIAGNOSIS — Z5111 Encounter for antineoplastic chemotherapy: Secondary | ICD-10-CM | POA: Diagnosis not present

## 2021-10-23 DIAGNOSIS — Z510 Encounter for antineoplastic radiation therapy: Secondary | ICD-10-CM | POA: Diagnosis not present

## 2021-10-23 LAB — RAD ONC ARIA SESSION SUMMARY
Course Elapsed Days: 31
Plan Fractions Treated to Date: 23
Plan Prescribed Dose Per Fraction: 1.8 Gy
Plan Total Fractions Prescribed: 25
Plan Total Prescribed Dose: 45 Gy
Reference Point Dosage Given to Date: 41.4 Gy
Reference Point Session Dosage Given: 1.8 Gy
Session Number: 23

## 2021-10-26 ENCOUNTER — Inpatient Hospital Stay: Payer: Medicare Other

## 2021-10-26 ENCOUNTER — Telehealth: Payer: Self-pay

## 2021-10-26 ENCOUNTER — Other Ambulatory Visit: Payer: Self-pay

## 2021-10-26 ENCOUNTER — Ambulatory Visit (HOSPITAL_COMMUNITY)
Admission: RE | Admit: 2021-10-26 | Discharge: 2021-10-26 | Disposition: A | Discharge status: Home / Self Care | Payer: Medicare Other | Source: Ambulatory Visit | Attending: Adult Health | Admitting: Adult Health

## 2021-10-26 ENCOUNTER — Inpatient Hospital Stay (HOSPITAL_BASED_OUTPATIENT_CLINIC_OR_DEPARTMENT_OTHER): Payer: Medicare Other | Admitting: Adult Health

## 2021-10-26 ENCOUNTER — Ambulatory Visit
Admission: RE | Admit: 2021-10-26 | Discharge: 2021-10-26 | Disposition: A | Discharge status: Home / Self Care | Payer: Medicare Other | Source: Ambulatory Visit | Attending: Radiation Oncology | Admitting: Radiation Oncology

## 2021-10-26 ENCOUNTER — Encounter: Payer: Self-pay | Admitting: Adult Health

## 2021-10-26 VITALS — BP 138/60 | HR 88 | Temp 99.3°F | Resp 18 | Ht 61.0 in | Wt 261.2 lb

## 2021-10-26 DIAGNOSIS — C50411 Malignant neoplasm of upper-outer quadrant of right female breast: Secondary | ICD-10-CM | POA: Diagnosis not present

## 2021-10-26 DIAGNOSIS — Z510 Encounter for antineoplastic radiation therapy: Secondary | ICD-10-CM | POA: Diagnosis not present

## 2021-10-26 DIAGNOSIS — Z170 Estrogen receptor positive status [ER+]: Secondary | ICD-10-CM | POA: Diagnosis not present

## 2021-10-26 DIAGNOSIS — R059 Cough, unspecified: Secondary | ICD-10-CM | POA: Diagnosis not present

## 2021-10-26 DIAGNOSIS — C50412 Malignant neoplasm of upper-outer quadrant of left female breast: Secondary | ICD-10-CM

## 2021-10-26 DIAGNOSIS — R052 Subacute cough: Secondary | ICD-10-CM | POA: Insufficient documentation

## 2021-10-26 DIAGNOSIS — C50911 Malignant neoplasm of unspecified site of right female breast: Secondary | ICD-10-CM | POA: Diagnosis not present

## 2021-10-26 DIAGNOSIS — R509 Fever, unspecified: Secondary | ICD-10-CM | POA: Diagnosis not present

## 2021-10-26 DIAGNOSIS — Z5111 Encounter for antineoplastic chemotherapy: Secondary | ICD-10-CM | POA: Diagnosis not present

## 2021-10-26 LAB — RAD ONC ARIA SESSION SUMMARY
Course Elapsed Days: 34
Plan Fractions Treated to Date: 24
Plan Prescribed Dose Per Fraction: 1.8 Gy
Plan Total Fractions Prescribed: 25
Plan Total Prescribed Dose: 45 Gy
Reference Point Dosage Given to Date: 43.2 Gy
Reference Point Session Dosage Given: 1.8 Gy
Session Number: 24

## 2021-10-26 MED ORDER — AZITHROMYCIN 250 MG PO TABS: ORAL_TABLET | ORAL | 0 refills | Status: DC

## 2021-10-26 MED ORDER — FULVESTRANT 250 MG/5ML IM SOSY
500.0000 mg | PREFILLED_SYRINGE | Freq: Once | INTRAMUSCULAR | Status: AC
  Administered 2021-10-26: 500 mg via INTRAMUSCULAR
  Filled 2021-10-26: qty 10

## 2021-10-26 NOTE — Telephone Encounter (Addendum)
TCT patient to advise of message below. Verified that prescription had been sent [facility]. Pt verbalized that if she has any breathing difficulties she will seek emergency medical help.  ----- Message from Gardenia Phlegm, NP sent at 10/26/2021  3:31 PM EDT ----- Patient has early pneumonia.  I talked to Dr. Sondra Come who is over her radiation and he is ok with her taking antibiotics.  I sent in a Zpak for her to take and he will see her tomorrow.  If she has any trouble breathing, she knows to go to er ----- Message ----- From: Interface, Rad Results In Sent: 10/26/2021   1:32 PM EDT To: Gardenia Phlegm, NP

## 2021-10-26 NOTE — Progress Notes (Unsigned)
[facility] Follow up:    [name] Spar, MD [address] [place] [address]   DIAGNOSIS: Cancer Staging  Malignant neoplasm of upper-outer quadrant of left breast in female, estrogen receptor positive ([place]) Staging form: Breast, AJCC 7th Edition - Clinical: Stage IA (T1c, N0, M0) - Signed by Chauncey Cruel, MD on 08/19/2015   SUMMARY OF ONCOLOGIC HISTORY: Oncology History  Malignant neoplasm of upper-outer quadrant of left breast in female, estrogen receptor positive ([place])  08/19/2015 Initial Diagnosis   Malignant neoplasm of upper-outer quadrant of left breast in female, estrogen receptor positive ([place])    Mammogram   71 y.o. [name] status post left breast upper outer quadrant lumpectomy 08/04/2015 for a pT1c pNX, stage I A invasive ductal carcinoma, estrogen and progesterone receptor positive, HER-2 not amplified, with no HER-2 amplification, anterior margin was focally positive, additional Left breast surgery 09/12/2015 cleared the margins and found all 5 axillary lymph nodes sampled clear for a final stage pT1c pN0, stage IA  Oncotype DX score of 12 predicts a 10 year risk of recurrence outside the breast of 8% if the patient's only systemic therapy is tamoxifen for 5 years. It also predicts no benefit from therapy. She had adjuvant radiation 10/22/15 - 12/08/15              1) Left breast: 50.4 Gy in 28 fractions              2) Left breast boost: 10 Gy in 5 fractions Started adjuvant anastrozole 02/02/2015    06/10/2021 Mammogram   Mammogram showed suspicious mass in 10:30 o'clock location of the RIGHT breast 9 centimeters from the nipple correlating with distortion seen mammographically. [place] right axilla negative. Pathology showed IDC, ER 90% positive, strong staining, PR 60% positive, moderate staining. Her 2 negative. Ki 2%. She was taking anastrazole when this abnormality was detected.    08/12/2021 Pathology Results   She had right  breast lumpectomy on July 12 which showed invasive ductal carcinoma with clear margins of resection, overall grade 2, tumor size 1.1 x 0.7 x 0.7 ER +90% PR +60% KI 2% and HER2 2+ by IHC negative by FISH     CURRENT THERAPY: [facility]  INTERVAL HISTORY: [name] [name] 71 y.o. 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   Pseudophakia of both eyes 04/01/2021   Retinal edema 04/01/2021   Trichiasis of eyelid of both eyes 04/01/2021   Hypertrophy of inferior nasal turbinate 02/26/2021   Acute bronchitis 01/30/2021   Allergic rhinitis due to pollen 01/29/2021   Allergy to influenza vaccine 01/29/2021   Functional diarrhea 01/29/2021   Gout 01/29/2021   Hyperlipidemia 97/67/3419   Nonalcoholic steatohepatitis ([facility]) 01/29/2021   Primary insomnia 01/29/2021   Recurrent major depression in remission ([place]) 01/29/2021   Unspecified cirrhosis of liver ([place]) 01/29/2021   Arthritis of right ankle 04/14/2020   Nasal septal deviation 03/11/2020   Arthritis of right subtalar joint 10/09/2019   Primary osteoarthritis of right ankle 10/09/2019   Acquired pes planovalgus of right foot 10/09/2019   Acquired posterior equinus of right lower extremity 10/09/2019   Epistaxis, recurrent 09/26/2019   OSA on CPAP 09/26/2019  Acquired hypothyroidism 05/21/2019   Arthritis of shoulder 09/07/2018   Bilateral hearing loss 08/01/2018   Vertigo 08/01/2018   Chronic nonintractable headache 08/01/2018   Type 2 diabetes mellitus with diabetic polyneuropathy, with long-term current use of insulin ([place]) 11/16/2016   Bilateral lower extremity edema 09/01/2016   Diabetic polyneuropathy associated with type 2 diabetes  mellitus ([place]) 06/03/2016   Malignant neoplasm of upper-outer quadrant of left breast in female, estrogen receptor positive ([place]) 08/19/2015   Osteoarthritis of right knee 02/28/2015   Status post total right knee replacement 02/28/2015   Splenomegaly 07/30/2014   Thrombocytopenia ([place]) 07/30/2014   Multifactorial gait disorder 01/12/2013   Recurrent falls 01/12/2013   Morbid obesity ([place]) 01/12/2013   Degenerative arthritis of spine 01/12/2013   Essential hypertension 09/05/2012    is allergic to codeine, influenza a (h1n1) monoval pf, hydrocodone-acetaminophen, lisinopril, metformin, pregabalin, and jardiance [empagliflozin].  MEDICAL HISTORY: Past Medical History:  Diagnosis Date   Abdominal wall contusion 12/09/2011   Achilles tendon contracture, bilateral 06/03/2016   Anxiety    Arthritis    Blood dyscrasia    low platelet count- sees [name] , [name] ( Note in [place] from 07/2014) at [facility]) 2017   Left Breast   Breast cancer ([place]) 07/2021   right breast IDC   Cancer ([place])    left breast   Chest wall contusion 12/09/2011   Cirrhosis of liver ([place])    Cough    Diabetes mellitus without complication ([place])    Diabetic neuropathy ([place])    Diarrhea due to drug 01/29/2021   Dyspnea    Gout    Gout    History of radiation therapy 10/22/15 - 12/08/15   left breast 50.4 Gy, boost to 10 Gy   Hypertension    Hypothyroidism    Migraine    Multifactorial gait disorder 01/12/2013   Neuromuscular disorder ([place])    diabetic neuropathy   Neuropathy    Personal history of radiation therapy 2017   Left Breast Cancer   Posterior tibial tendon dysfunction (PTTD) of right lower extremity 10/09/2019   Sleep apnea    CPAP nightly   Spleen enlarged    Thyroid disease     SURGICAL HISTORY: Past Surgical History:  Procedure Laterality Date   ANKLE FUSION Right 2022   BREAST BIOPSY Left 08/04/2015   Procedure: LEFT BREAST BIOPSY  WITH NEEDLE LOCALIZATION;  Surgeon: [name], MD;  Location: [facility];  Service: General;  Laterality: Left;   BREAST BIOPSY Right 06/23/2021   BREAST DUCTAL SYSTEM EXCISION Left 08/04/2015   Procedure: LEFT EXCISION DUCTAL SYSTEM BREAST;  Surgeon: [name], MD;  Location: [facility];  Service: General;  Laterality: Left;   BREAST LUMPECTOMY Left 08/2015   BREAST LUMPECTOMY WITH AXILLARY LYMPH NODE BIOPSY Left 09/12/2015   Procedure: RE-EXCISION OF LEFT BREAST LUMPECTOMY WITH LEFT AXILLARY LYMPH NODE BIOPSY;  Surgeon: [name], MD;  Location: [facility];  Service: General;  Laterality: Left;   BREAST LUMPECTOMY WITH RADIOACTIVE SEED LOCALIZATION Right 08/12/2021   Procedure: RIGHT BREAST LUMPECTOMY WITH RADIOACTIVE SEED LOCALIZATION;  Surgeon: Coralie Keens, MD;  Location: [facility];  Service: General;  Laterality: Right;  LMA   CHOLECYSTECTOMY     EYE SURGERY Bilateral 2022   Lashes growing backwards into the eye   JOINT REPLACEMENT     left knee   JOINT REPLACEMENT  2017   right knee  REVERSE SHOULDER ARTHROPLASTY Right 09/07/2018   Procedure: RIGHT REVERSE SHOULDER ARTHROPLASTY;  Surgeon: Meredith Pel, MD;  Location: [facility];  Service: Orthopedics;  Laterality: Right;   TOTAL KNEE ARTHROPLASTY Right 02/28/2015   Procedure: RIGHT TOTAL KNEE ARTHROPLASTY;  Surgeon: [name], MD;  Location: [facility];  Service: Orthopedics;  Laterality: Right;   TUBAL LIGATION      SOCIAL HISTORY: Social History   Socioeconomic History   Marital status: Married    Spouse name: edward   Number of children: 3   Years of education: 12th   Highest education level: Not on file  Occupational History   Occupation: Retired    Comment: Manufacturing systems engineer  Tobacco Use   Smoking status: Never   Smokeless tobacco: Never  Scientific laboratory technician Use: Never used  Substance and Sexual Activity   Alcohol use: No   Drug  use: No   Sexual activity: Yes    Birth control/protection: Surgical    Comment: BTL  Other Topics Concern   Not on file  Social History Narrative   Not on file   Social Determinants of Health   Financial Resource Strain: Low Risk  (02/11/2021)   Overall Financial Resource Strain (CARDIA)    Difficulty of Paying Living Expenses: Not very hard  Food Insecurity: No Food Insecurity (08/27/2021)   Hunger Vital Sign    Worried About Running Out of Food in the Last Year: Never true    Ran Out of Food in the Last Year: Never true  Transportation Needs: No Transportation Needs (08/27/2021)   PRAPARE - Hydrologist (Medical): No    Lack of Transportation (Non-Medical): No  Physical Activity: Insufficiently Active (02/11/2021)   Exercise Vital Sign    Days of Exercise per Week: 3 days    Minutes of Exercise per Session: 30 min  Stress: No Stress Concern Present (02/11/2021)   [place]    Feeling of Stress : Only a little  Social Connections: Moderately Integrated (02/11/2021)   Social Connection and Isolation Panel [NHANES]    Frequency of Communication with Friends and Family: Three times a week    Frequency of Social Gatherings with Friends and Family: Never    Attends Religious Services: More than 4 times per year    Active Member of [employer] or Organizations: No    Attends Archivist Meetings: Never    Marital Status: Married  Human resources officer Violence: Not At Risk (02/11/2021)   Humiliation, Afraid, Rape, and Kick questionnaire    Fear of Current or Ex-Partner: No    Emotionally Abused: No    Physically Abused: No    Sexually Abused: No    FAMILY HISTORY: Family History  Problem Relation Age of Onset   Hypothyroidism Mother    Alzheimer's disease Mother    Diabetes Father    Cancer Sister        GYN cancer   Hypothyroidism Sister    Heart disease Brother    Arthritis Son     Diabetes Son    Arthritis Son    Cancer Maternal Grandmother    Heart failure Maternal Grandfather    Bone cancer Paternal Grandmother    Heart failure Paternal Grandmother     Review of Systems - Oncology    PHYSICAL EXAMINATION  ECOG PERFORMANCE STATUS: {CHL ONC ECOG [id number]}  Vitals:   10/26/21 1123  BP: 138/60  Pulse:  88  Resp: 18  Temp: 99.3 F (37.4 C)  SpO2: 92%    Physical Exam  LABORATORY DATA:  CBC    Component Value Date/Time   WBC 3.7 (L) 08/12/2021 0720   RBC 4.46 08/12/2021 0720   HGB 12.7 08/12/2021 0720   HGB 12.0 02/18/2021 1816   HGB 11.6 10/06/2016 1211   HCT 39.6 08/12/2021 0720   HCT 36.6 02/18/2021 1816   HCT 36.1 10/06/2016 1211   PLT 80 (L) 08/12/2021 0720   PLT 98 (LL) 02/18/2021 1816   MCV 88.8 08/12/2021 0720   MCV 88 02/18/2021 1816   MCV 90.7 10/06/2016 1211   MCH 28.5 08/12/2021 0720   MCHC 32.1 08/12/2021 0720   RDW 14.8 08/12/2021 0720   RDW 15.4 02/18/2021 1816   RDW 15.2 (H) 10/06/2016 1211   LYMPHSABS 0.8 02/18/2021 1816   LYMPHSABS 0.8 (L) 10/06/2016 1211   MONOABS 0.2 12/25/2019 1315   MONOABS 0.2 10/06/2016 1211   EOSABS 0.1 02/18/2021 1816   BASOSABS 0.0 02/18/2021 1816   BASOSABS 0.0 10/06/2016 1211    CMP     Component Value Date/Time   NA 136 08/10/2021 0956   NA 138 07/05/2018 1635   NA 142 10/06/2016 1211   K 4.0 08/10/2021 0956   K 3.8 10/06/2016 1211   CL 99 08/10/2021 0956   CO2 23 08/10/2021 0956   CO2 25 10/06/2016 1211   GLUCOSE 146 (H) 08/10/2021 0956   GLUCOSE 179 (H) 10/06/2016 1211   BUN 15 08/10/2021 0956   BUN 19 07/05/2018 1635   BUN 16.9 10/06/2016 1211   CREATININE 0.91 08/10/2021 0956   CREATININE 1.19 (H) 12/25/2019 1315   CREATININE 1.09 (H) 09/28/2017 1516   CREATININE 1.1 10/06/2016 1211   CALCIUM 10.4 (H) 08/10/2021 0956   CALCIUM 10.4 10/06/2016 1211   PROT 7.5 12/25/2019 1315   PROT 7.5 07/05/2018 1635   PROT 7.6 10/06/2016 1211   ALBUMIN 3.5 12/25/2019 1315    ALBUMIN 4.6 07/05/2018 1635   ALBUMIN 3.9 10/06/2016 1211   AST 33 12/25/2019 1315   AST 33 10/06/2016 1211   ALT 18 12/25/2019 1315   ALT 20 10/06/2016 1211   ALKPHOS 118 12/25/2019 1315   ALKPHOS 86 10/06/2016 1211   BILITOT 0.4 12/25/2019 1315   BILITOT 0.52 10/06/2016 1211   GFRNONAA >60 08/10/2021 0956   GFRNONAA 49 (L) 12/25/2019 1315   GFRNONAA 52 (L) 09/28/2017 1516   GFRAA >60 11/07/2018 1133   GFRAA 61 09/28/2017 1516       PENDING LABS:   RADIOGRAPHIC STUDIES:  No results found.   PATHOLOGY:     ASSESSMENT and THERAPY PLAN:   No problem-specific Assessment & Plan notes found for this encounter.   No orders of the defined types were placed in this encounter.   All questions were answered. The patient knows to call the clinic with any problems, questions or concerns. We can certainly see the patient much sooner if necessary. This note was electronically signed. Scot Dock, NP 10/26/2021

## 2021-10-27 ENCOUNTER — Telehealth: Payer: Self-pay | Admitting: Hematology and Oncology

## 2021-10-27 ENCOUNTER — Ambulatory Visit
Admission: RE | Admit: 2021-10-27 | Discharge: 2021-10-27 | Disposition: A | Discharge status: Home / Self Care | Payer: Medicare Other | Source: Ambulatory Visit | Attending: Radiation Oncology | Admitting: Radiation Oncology

## 2021-10-27 ENCOUNTER — Other Ambulatory Visit: Payer: Self-pay

## 2021-10-27 ENCOUNTER — Ambulatory Visit: Payer: Medicare Other

## 2021-10-27 ENCOUNTER — Ambulatory Visit: Payer: Medicare Other | Attending: Radiation Oncology

## 2021-10-27 DIAGNOSIS — C50412 Malignant neoplasm of upper-outer quadrant of left female breast: Secondary | ICD-10-CM | POA: Diagnosis not present

## 2021-10-27 DIAGNOSIS — Z510 Encounter for antineoplastic radiation therapy: Secondary | ICD-10-CM | POA: Diagnosis not present

## 2021-10-27 DIAGNOSIS — Z170 Estrogen receptor positive status [ER+]: Secondary | ICD-10-CM | POA: Diagnosis not present

## 2021-10-27 DIAGNOSIS — C50411 Malignant neoplasm of upper-outer quadrant of right female breast: Secondary | ICD-10-CM | POA: Diagnosis not present

## 2021-10-27 DIAGNOSIS — R052 Subacute cough: Secondary | ICD-10-CM | POA: Diagnosis not present

## 2021-10-27 DIAGNOSIS — Z5111 Encounter for antineoplastic chemotherapy: Secondary | ICD-10-CM | POA: Diagnosis not present

## 2021-10-27 DIAGNOSIS — C50911 Malignant neoplasm of unspecified site of right female breast: Secondary | ICD-10-CM | POA: Diagnosis not present

## 2021-10-27 LAB — RAD ONC ARIA SESSION SUMMARY
Course Elapsed Days: 35
Plan Fractions Treated to Date: 25
Plan Prescribed Dose Per Fraction: 1.8 Gy
Plan Total Fractions Prescribed: 25
Plan Total Prescribed Dose: 45 Gy
Reference Point Dosage Given to Date: 45 Gy
Reference Point Session Dosage Given: 1.8 Gy
Session Number: 25

## 2021-10-27 NOTE — Telephone Encounter (Signed)
Scheduled appointment per 9/25 los. Patient is aware.

## 2021-10-28 ENCOUNTER — Other Ambulatory Visit: Payer: Self-pay

## 2021-10-28 ENCOUNTER — Ambulatory Visit
Admission: RE | Admit: 2021-10-28 | Discharge: 2021-10-28 | Disposition: A | Discharge status: Home / Self Care | Payer: Medicare Other | Source: Ambulatory Visit | Attending: Radiation Oncology | Admitting: Radiation Oncology

## 2021-10-28 ENCOUNTER — Encounter: Payer: Self-pay | Admitting: Hematology and Oncology

## 2021-10-28 ENCOUNTER — Ambulatory Visit: Payer: Medicare Other

## 2021-10-28 DIAGNOSIS — C50411 Malignant neoplasm of upper-outer quadrant of right female breast: Secondary | ICD-10-CM | POA: Diagnosis not present

## 2021-10-28 DIAGNOSIS — Z170 Estrogen receptor positive status [ER+]: Secondary | ICD-10-CM | POA: Diagnosis not present

## 2021-10-28 DIAGNOSIS — C50911 Malignant neoplasm of unspecified site of right female breast: Secondary | ICD-10-CM | POA: Diagnosis not present

## 2021-10-28 DIAGNOSIS — R052 Subacute cough: Secondary | ICD-10-CM | POA: Diagnosis not present

## 2021-10-28 DIAGNOSIS — Z5111 Encounter for antineoplastic chemotherapy: Secondary | ICD-10-CM | POA: Diagnosis not present

## 2021-10-28 DIAGNOSIS — C50412 Malignant neoplasm of upper-outer quadrant of left female breast: Secondary | ICD-10-CM | POA: Diagnosis not present

## 2021-10-28 DIAGNOSIS — Z510 Encounter for antineoplastic radiation therapy: Secondary | ICD-10-CM | POA: Diagnosis not present

## 2021-10-28 LAB — RAD ONC ARIA SESSION SUMMARY
Course Elapsed Days: 36
Plan Fractions Treated to Date: 1
Plan Prescribed Dose Per Fraction: 2 Gy
Plan Total Fractions Prescribed: 3
Plan Total Prescribed Dose: 6 Gy
Reference Point Dosage Given to Date: 2 Gy
Reference Point Session Dosage Given: 2 Gy
Session Number: 26

## 2021-10-28 NOTE — Assessment & Plan Note (Signed)
71 year old woman with history of stage Ia left breast invasive ductal carcinoma estrogen and progesterone receptor positive, status postlumpectomy, adjuvant radiation and antiestrogen therapy with anastrozole.    She then was diagnosed with right-sided invasive breast cancer in [place] 2023 that was stage Ia estrogen, progesterone positive and HER2 negative status postlumpectomy, currently undergoing adjuvant radiation and Faslodex injections.  Almyra Free seems to be tolerating the Faslodex injections very well.  She will continue these.  She does have some fatigue and we discussed energy conservation.  She is also undergoing adjuvant radiation which she will continue and will finish out next week.  I am concerned about her fevers and persistent cough.  We will get a chest x-ray and will prescribe antibiotics if needed.  I did touch base with Dr. Sondra Come about this patient and if he recommended we avoid any antibiotics in particular since she is undergoing radiation.  The only antibiotic he recommended avoiding was doxycycline.  Ting will return in a few weeks for follow-up with Dr. Chryl Heck.  She knows to call for any questions or concerns in between now and her next visit.

## 2021-10-29 ENCOUNTER — Ambulatory Visit: Payer: Medicare Other

## 2021-10-29 ENCOUNTER — Other Ambulatory Visit: Payer: Self-pay

## 2021-10-29 ENCOUNTER — Ambulatory Visit
Admission: RE | Admit: 2021-10-29 | Discharge: 2021-10-29 | Disposition: A | Discharge status: Home / Self Care | Payer: Medicare Other | Source: Ambulatory Visit | Attending: Radiation Oncology | Admitting: Radiation Oncology

## 2021-10-29 DIAGNOSIS — Z5111 Encounter for antineoplastic chemotherapy: Secondary | ICD-10-CM | POA: Diagnosis not present

## 2021-10-29 DIAGNOSIS — Z170 Estrogen receptor positive status [ER+]: Secondary | ICD-10-CM | POA: Diagnosis not present

## 2021-10-29 DIAGNOSIS — C50412 Malignant neoplasm of upper-outer quadrant of left female breast: Secondary | ICD-10-CM | POA: Diagnosis not present

## 2021-10-29 DIAGNOSIS — Z510 Encounter for antineoplastic radiation therapy: Secondary | ICD-10-CM | POA: Diagnosis not present

## 2021-10-29 DIAGNOSIS — C50911 Malignant neoplasm of unspecified site of right female breast: Secondary | ICD-10-CM | POA: Diagnosis not present

## 2021-10-29 DIAGNOSIS — R052 Subacute cough: Secondary | ICD-10-CM | POA: Diagnosis not present

## 2021-10-29 LAB — RAD ONC ARIA SESSION SUMMARY
Course Elapsed Days: 37
Plan Fractions Treated to Date: 2
Plan Prescribed Dose Per Fraction: 2 Gy
Plan Total Fractions Prescribed: 3
Plan Total Prescribed Dose: 6 Gy
Reference Point Dosage Given to Date: 4 Gy
Reference Point Session Dosage Given: 2 Gy
Session Number: 27

## 2021-10-30 ENCOUNTER — Encounter: Payer: Self-pay | Admitting: *Deleted

## 2021-10-30 ENCOUNTER — Encounter: Payer: Self-pay | Admitting: Radiation Oncology

## 2021-10-30 ENCOUNTER — Other Ambulatory Visit: Payer: Self-pay

## 2021-10-30 ENCOUNTER — Ambulatory Visit
Admission: RE | Admit: 2021-10-30 | Discharge: 2021-10-30 | Disposition: A | Discharge status: Home / Self Care | Payer: Medicare Other | Source: Ambulatory Visit | Attending: Radiation Oncology | Admitting: Radiation Oncology

## 2021-10-30 DIAGNOSIS — Z170 Estrogen receptor positive status [ER+]: Secondary | ICD-10-CM

## 2021-10-30 DIAGNOSIS — C50911 Malignant neoplasm of unspecified site of right female breast: Secondary | ICD-10-CM | POA: Diagnosis not present

## 2021-10-30 DIAGNOSIS — R052 Subacute cough: Secondary | ICD-10-CM | POA: Diagnosis not present

## 2021-10-30 DIAGNOSIS — Z5111 Encounter for antineoplastic chemotherapy: Secondary | ICD-10-CM | POA: Diagnosis not present

## 2021-10-30 DIAGNOSIS — C50412 Malignant neoplasm of upper-outer quadrant of left female breast: Secondary | ICD-10-CM | POA: Diagnosis not present

## 2021-10-30 DIAGNOSIS — Z510 Encounter for antineoplastic radiation therapy: Secondary | ICD-10-CM | POA: Diagnosis not present

## 2021-10-30 DIAGNOSIS — C50411 Malignant neoplasm of upper-outer quadrant of right female breast: Secondary | ICD-10-CM | POA: Diagnosis not present

## 2021-10-30 LAB — RAD ONC ARIA SESSION SUMMARY
Course Elapsed Days: 38
Plan Fractions Treated to Date: 3
Plan Prescribed Dose Per Fraction: 2 Gy
Plan Total Fractions Prescribed: 3
Plan Total Prescribed Dose: 6 Gy
Reference Point Dosage Given to Date: 6 Gy
Reference Point Session Dosage Given: 2 Gy
Session Number: 28

## 2021-11-03 ENCOUNTER — Ambulatory Visit (INDEPENDENT_AMBULATORY_CARE_PROVIDER_SITE_OTHER): Payer: Medicare Other | Admitting: Internal Medicine

## 2021-11-03 ENCOUNTER — Encounter: Payer: Self-pay | Admitting: Internal Medicine

## 2021-11-03 VITALS — BP 118/68 | HR 100 | Resp 20

## 2021-11-03 DIAGNOSIS — J301 Allergic rhinitis due to pollen: Secondary | ICD-10-CM

## 2021-11-03 DIAGNOSIS — R053 Chronic cough: Secondary | ICD-10-CM

## 2021-11-03 DIAGNOSIS — L304 Erythema intertrigo: Secondary | ICD-10-CM

## 2021-11-03 DIAGNOSIS — J209 Acute bronchitis, unspecified: Secondary | ICD-10-CM | POA: Diagnosis not present

## 2021-11-03 MED ORDER — AMOXICILLIN-POT CLAVULANATE 875-125 MG PO TABS: 1.0000 | ORAL_TABLET | Freq: Two times a day (BID) | ORAL | 0 refills | Status: DC

## 2021-11-03 MED ORDER — ALBUTEROL SULFATE HFA 108 (90 BASE) MCG/ACT IN AERS: 1.0000 | INHALATION_SPRAY | RESPIRATORY_TRACT | 2 refills | Status: AC | PRN

## 2021-11-03 MED ORDER — KETOCONAZOLE 2 % EX CREA: 1.0000 | TOPICAL_CREAM | Freq: Every day | CUTANEOUS | 0 refills | Status: AC

## 2021-11-03 MED ORDER — PROMETHAZINE-DM 6.25-15 MG/5ML PO SYRP: 5.0000 mL | ORAL_SOLUTION | Freq: Four times a day (QID) | ORAL | 0 refills | Status: DC | PRN

## 2021-11-03 NOTE — Assessment & Plan Note (Signed)
Cough and mild dyspnea for about the last 10 days Started empiric Augmentin Promethazine DM as needed for cough Would avoid steroids due to history of DM Repeat CXR in 4 weeks at Cancer center

## 2021-11-03 NOTE — Assessment & Plan Note (Signed)
Followed by ENT specialist at [facility] to use Flonase regularly Nasal saline spray PRN

## 2021-11-03 NOTE — Patient Instructions (Addendum)
Please folic acid 893 mcg once daily.  Please take Augmentin as prescribed.  Please use Albuterol inhaler as needed for shortness of breath or wheezing.  Continue using Flonase for allergies.

## 2021-11-03 NOTE — Progress Notes (Signed)
Established Patient Office Visit  Subjective:  Patient ID: [name] [name], female    DOB: 1950-02-04  Age: 71 y.o. MRN: [id number]  CC:  Chief Complaint  Patient presents with   Cough    Patient has cough congestion ct showed pneumonia she is wheezing really bad she is also burnt on her skin from radiation wants to know if there is a thicker cream for this     HPI [name] [name] is a 71 y.o. female with past medical history of HTN, type II DM with neuropathy, OSA, hypothyroidism, OA of multiple joints, gout, MDD, breast cancer s/p lumpectomy and morbid obesity who presents for f/u of persistent cough and c/o rash underneath her breasts and axilla.  She recently had worsening of her cough and dyspnea.  She had x-ray of chest done, which showed bronchitis/developing pneumonia.  Her oncologist prescribed azithromycin, which she has completed now.  She still complains of persistent cough with clear sputum and dyspnea.  She has been using albuterol inhaler as needed for dyspnea with mild relief.  She denies any fever or chills.  She denies hemoptysis.  She has been taking Promethazine DM syrup as needed for cough.  Of note, she has history of chronic sinusitis due to DNS, has had ENT evaluation.  She uses Flonase infrequently for nasal congestion.  She also reports having rash underneath her breasts since taking antibiotic.  She also has redness and itching underneath her axilla and in the abdominal folds.  She has tried nystatin powder and Neosporin cream without much relief.  Past Medical History:  Diagnosis Date   Abdominal wall contusion 12/09/2011   Achilles tendon contracture, bilateral 06/03/2016   Anxiety    Arthritis    Blood dyscrasia    low platelet count- sees [name] , [name] ( Note in [place] from 07/2014) at [facility]) 2017   Left Breast   Breast cancer ([place]) 07/2021   right breast IDC   Cancer ([place])    left breast   Chest wall  contusion 12/09/2011   Cirrhosis of liver ([place])    Cough    Diabetes mellitus without complication ([place])    Diabetic neuropathy ([place])    Diarrhea due to drug 01/29/2021   Dyspnea    Gout    Gout    History of radiation therapy 10/22/15 - 12/08/15   left breast 50.4 Gy, boost to 10 Gy   Hypertension    Hypothyroidism    Migraine    Multifactorial gait disorder 01/12/2013   Neuromuscular disorder ([place])    diabetic neuropathy   Neuropathy    Personal history of radiation therapy 2017   Left Breast Cancer   Posterior tibial tendon dysfunction (PTTD) of right lower extremity 10/09/2019   Sleep apnea    CPAP nightly   Spleen enlarged    Thyroid disease     Past Surgical History:  Procedure Laterality Date   ANKLE FUSION Right 2022   BREAST BIOPSY Left 08/04/2015   Procedure: LEFT BREAST BIOPSY WITH NEEDLE LOCALIZATION;  Surgeon: [name], MD;  Location: [place];  Service: General;  Laterality: Left;   BREAST BIOPSY Right 06/23/2021   BREAST DUCTAL SYSTEM EXCISION Left 08/04/2015   Procedure: LEFT EXCISION DUCTAL SYSTEM BREAST;  Surgeon: [name], MD;  Location: [place];  Service: General;  Laterality: Left;   BREAST LUMPECTOMY Left 08/2015   BREAST LUMPECTOMY WITH AXILLARY LYMPH NODE BIOPSY  Left 09/12/2015   Procedure: RE-EXCISION OF LEFT BREAST LUMPECTOMY WITH LEFT AXILLARY LYMPH NODE BIOPSY;  Surgeon: [name], MD;  Location: [place];  Service: General;  Laterality: Left;   BREAST LUMPECTOMY WITH RADIOACTIVE SEED LOCALIZATION Right 08/12/2021   Procedure: RIGHT BREAST LUMPECTOMY WITH RADIOACTIVE SEED LOCALIZATION;  Surgeon: Coralie Keens, MD;  Location: [place];  Service: General;  Laterality: Right;  LMA   CHOLECYSTECTOMY     EYE SURGERY Bilateral 2022   Lashes growing backwards into the eye   JOINT REPLACEMENT     left knee   JOINT REPLACEMENT  2017   right knee   REVERSE SHOULDER  ARTHROPLASTY Right 09/07/2018   Procedure: RIGHT REVERSE SHOULDER ARTHROPLASTY;  Surgeon: Meredith Pel, MD;  Location: [place];  Service: Orthopedics;  Laterality: Right;   TOTAL KNEE ARTHROPLASTY Right 02/28/2015   Procedure: RIGHT TOTAL KNEE ARTHROPLASTY;  Surgeon: [name], MD;  Location: [facility];  Service: Orthopedics;  Laterality: Right;   TUBAL LIGATION      Family History  Problem Relation Age of Onset   Hypothyroidism Mother    Alzheimer's disease Mother    Diabetes Father    Cancer Sister        GYN cancer   Hypothyroidism Sister    Heart disease Brother    Arthritis Son    Diabetes Son    Arthritis Son    Cancer Maternal Grandmother    Heart failure Maternal Grandfather    Bone cancer Paternal Grandmother    Heart failure Paternal Grandmother     Social History   Socioeconomic History   Marital status: Married    Spouse name: edward   Number of children: 3   Years of education: 12th   Highest education level: Not on file  Occupational History   Occupation: Retired    Comment: Manufacturing systems engineer  Tobacco Use   Smoking status: Never   Smokeless tobacco: Never  Scientific laboratory technician Use: Never used  Substance and Sexual Activity   Alcohol use: No   Drug use: No   Sexual activity: Yes    Birth control/protection: Surgical    Comment: BTL  Other Topics Concern   Not on file  Social History Narrative   Not on file   Social Determinants of Health   Financial Resource Strain: Low Risk  (02/11/2021)   Overall Financial Resource Strain (CARDIA)    Difficulty of Paying Living Expenses: Not very hard  Food Insecurity: No Food Insecurity (08/27/2021)   Hunger Vital Sign    Worried About Running Out of Food in the Last Year: Never true    Ran Out of Food in the Last Year: Never true  Transportation Needs: No Transportation Needs (08/27/2021)   PRAPARE - Hydrologist (Medical): No    Lack of Transportation  (Non-Medical): No  Physical Activity: Insufficiently Active (02/11/2021)   Exercise Vital Sign    Days of Exercise per Week: 3 days    Minutes of Exercise per Session: 30 min  Stress: No Stress Concern Present (02/11/2021)   [place]    Feeling of Stress : Only a little  Social Connections: Moderately Integrated (02/11/2021)   Social Connection and Isolation Panel [NHANES]    Frequency of Communication with Friends and Family: Three times a week    Frequency of Social Gatherings with Friends and Family: Never  Attends Religious Services: More than 4 times per year    Active Member of Clubs or Organizations: No    Attends Archivist Meetings: Never    Marital Status: Married  Human resources officer Violence: Not At Risk (02/11/2021)   Humiliation, Afraid, Rape, and Kick questionnaire    Fear of Current or Ex-Partner: No    Emotionally Abused: No    Physically Abused: No    Sexually Abused: No    Outpatient Medications Prior to Visit  Medication Sig Dispense Refill   ACCU-CHEK AVIVA PLUS test strip USE AS INSTRUCTED TO CHECK BLOOD SUGAR 2 TIMES DAILY 100 strip 5   acetaminophen (TYLENOL) 500 MG tablet Take by mouth.     Ascorbic Acid (VITAMIN C PO) Take by mouth daily.     buPROPion (WELLBUTRIN XL) 150 MG 24 hr tablet Take 1 tablet (150 mg total) by mouth every morning. 90 tablet 1   citalopram (CELEXA) 40 MG tablet TAKE 1 TABLET BY MOUTH EVERY DAY 90 tablet 1   colchicine 0.6 MG tablet Take 2 tablets once today, followed by 1 tablet 1 hour later, and then take 1 tablet once daily from Day 2. 20 tablet 0   Cyanocobalamin (VITAMIN B-12 PO) Take by mouth daily.     dapagliflozin propanediol (FARXIGA) 10 MG TABS tablet Take 1 tablet (10 mg total) by mouth daily before breakfast. 90 tablet 3   fluticasone (FLONASE) 50 MCG/ACT nasal spray Place 1 spray into both nostrils as needed.      gabapentin (NEURONTIN) 600 MG  tablet TAKE 1 TABLET IN THE MORNING AND 1 TABLET IN THE AFTERNOON AND 2TABS AT BEDTIME     insulin aspart (FIASP FLEXTOUCH) 100 UNIT/ML FlexTouch Pen Inject 18 Units into the skin with breakfast, with lunch, and with evening meal. 45 mL 3   Insulin Glargine (BASAGLAR KWIKPEN) 100 UNIT/ML Inject 60 Units into the skin daily. 60 mL 3   Insulin Pen Needle (B-D UF III MINI PEN NEEDLES) 31G X 5 MM MISC USE AS DIRECTED WITH INSULIN EVERY MORNING, NOON, EVENING AND AT BEDTIME 400 each 3   Insulin Pen Needle 30G X 5 MM MISC 1 Device by Does not apply route in the morning, at noon, in the evening, and at bedtime. 400 each 3   levothyroxine (SYNTHROID) 150 MCG tablet Take 1 tablet (150 mcg total) by mouth daily. 90 tablet 3   liraglutide (VICTOZA) 18 MG/3ML SOPN Inject 1.8 mg into the skin daily. 27 mL 3   losartan (COZAAR) 25 MG tablet Take 1 tablet (25 mg total) by mouth daily. 90 tablet 1   methocarbamol (ROBAXIN) 500 MG tablet Take 1 tablet (500 mg total) by mouth every 8 (eight) hours as needed for muscle spasms. 30 tablet 0   Misc Natural Products (TART CHERRY ADVANCED) CAPS Take 1 capsule by mouth daily.     Multiple Vitamins-Minerals (MULTIVITAMIN WITH MINERALS) tablet Take 1 tablet by mouth daily.     mycophenolate (CELLCEPT) 500 MG tablet Take 1,500 mg by mouth 2 (two) times daily.     Pyridoxine HCl (VITAMIN B-6 PO) Take by mouth daily.     VITAMIN D PO Take by mouth daily.     albuterol (VENTOLIN HFA) 108 (90 Base) MCG/ACT inhaler 1 puff as needed     promethazine-dextromethorphan (PROMETHAZINE-DM) 6.25-15 MG/5ML syrup Take 5 mLs by mouth 4 (four) times daily as needed for cough. 118 mL 0   azithromycin (ZITHROMAX Z-PAK) 250 MG tablet 2 tab on  day 1 then 1 tab daily until complete (Patient not taking: Reported on 11/03/2021) 6 each 0   No facility-administered medications prior to visit.    Allergies  Allergen Reactions   Codeine Swelling   Influenza A (H1n1) Monoval Pf Anaphylaxis    Hydrocodone-Acetaminophen Other (See Comments)   Lisinopril Other (See Comments)   Metformin Other (See Comments)   Pregabalin Other (See Comments)   Jardiance [Empagliflozin] Other (See Comments)    dehydration    ROS Review of Systems  Constitutional:  Negative for chills and fever.  HENT:  Positive for congestion and sinus pressure. Negative for sinus pain.   Respiratory:  Positive for cough, shortness of breath and wheezing.   Cardiovascular:  Negative for chest pain and palpitations.  Gastrointestinal:  Negative for diarrhea, nausea and vomiting.  Genitourinary:  Negative for dysuria and hematuria.  Musculoskeletal:  Positive for arthralgias, back pain, gait problem and neck pain.  Skin:  Positive for rash.  Neurological:  Positive for weakness. Negative for dizziness.  Psychiatric/Behavioral:  Negative for agitation and behavioral problems.       Objective:    Physical Exam Vitals reviewed.  Constitutional:      General: She is not in acute distress.    Appearance: She is obese. She is not diaphoretic.     Comments: In wheelchair  HENT:     Head: Normocephalic and atraumatic.     Nose: Congestion present.     Mouth/Throat:     Mouth: Mucous membranes are moist.  Eyes:     General: No scleral icterus.    Extraocular Movements: Extraocular movements intact.  Cardiovascular:     Rate and Rhythm: Normal rate and regular rhythm.     Pulses: Normal pulses.     Heart sounds: Normal heart sounds. No murmur heard. Pulmonary:     Breath sounds: Rhonchi (B/l) present. No wheezing or rales.  Musculoskeletal:     Cervical back: Neck supple. No tenderness.     Right lower leg: No edema.     Left lower leg: No edema.  Skin:    General: Skin is warm.     Findings: Rash (Erythematous rash underneath breasts and axilla) present.  Neurological:     General: No focal deficit present.     Mental Status: She is alert and oriented to person, place, and time.     Sensory: Sensory  deficit present.     Motor: Weakness (4/5 in b/l LE) present.     Gait: Gait abnormal.  Psychiatric:        Mood and Affect: Mood is depressed.        Behavior: Behavior normal.     BP 118/68 (BP Location: Left Arm, Patient Position: Sitting, Cuff Size: Normal)   Pulse 100   Resp 20   SpO2 94%  Wt Readings from Last 3 Encounters:  10/26/21 261 lb 3.2 oz (118.5 kg)  10/12/21 259 lb 6.4 oz (117.7 kg)  09/23/21 264 lb 1.6 oz (119.8 kg)    Lab Results  Component Value Date   TSH 17.70 (H) 06/09/2021   Lab Results  Component Value Date   WBC 3.7 (L) 08/12/2021   HGB 12.7 08/12/2021   HCT 39.6 08/12/2021   MCV 88.8 08/12/2021   PLT 80 (L) 08/12/2021   Lab Results  Component Value Date   NA 136 08/10/2021   K 4.0 08/10/2021   CHLORIDE 104 10/06/2016   CO2 23 08/10/2021   GLUCOSE 146 (H) 08/10/2021  BUN 15 08/10/2021   CREATININE 0.91 08/10/2021   BILITOT 0.4 12/25/2019   ALKPHOS 118 12/25/2019   AST 33 12/25/2019   ALT 18 12/25/2019   PROT 7.5 12/25/2019   ALBUMIN 3.5 12/25/2019   CALCIUM 10.4 (H) 08/10/2021   ANIONGAP 14 08/10/2021   EGFR 50 (L) 10/06/2016   GFR 50.27 (L) 08/24/2019   Lab Results  Component Value Date   CHOL 171 06/09/2021   Lab Results  Component Value Date   HDL 63.30 06/09/2021   Lab Results  Component Value Date   LDLCALC 80 06/09/2021   Lab Results  Component Value Date   TRIG 137.0 06/09/2021   Lab Results  Component Value Date   CHOLHDL 3 06/09/2021   Lab Results  Component Value Date   HGBA1C 8.2 (A) 06/09/2021      Assessment & Plan:   Problem List Items Addressed This Visit       Respiratory   Allergic rhinitis due to pollen    Followed by ENT specialist at [facility] to use Flonase regularly Nasal saline spray PRN      Acute bronchitis - Primary    Cough and mild dyspnea for about the last 10 days Started empiric Augmentin Promethazine DM as needed for cough Would avoid steroids due to history  of DM [facility]      Relevant Medications   albuterol (VENTOLIN HFA) 108 (90 Base) MCG/ACT inhaler   amoxicillin-clavulanate (AUGMENTIN) 875-125 MG tablet   Other Visit Diagnoses     Intertrigo     Rash underneath her breasts and axilla likely intertrigo Has tried nystatin powder and Neosporin cream Started Ketoconazole cream   Relevant Medications   ketoconazole (NIZORAL) 2 % cream   Chronic cough       Relevant Medications   promethazine-dextromethorphan (PROMETHAZINE-DM) 6.25-15 MG/5ML syrup       Meds ordered this encounter  Medications   ketoconazole (NIZORAL) 2 % cream    Sig: Apply 1 Application topically daily.    Dispense:  60 g    Refill:  0   promethazine-dextromethorphan (PROMETHAZINE-DM) 6.25-15 MG/5ML syrup    Sig: Take 5 mLs by mouth 4 (four) times daily as needed for cough.    Dispense:  473 mL    Refill:  0   albuterol (VENTOLIN HFA) 108 (90 Base) MCG/ACT inhaler    Sig: Inhale 1-2 puffs into the lungs every 4 (four) hours as needed for wheezing or shortness of breath.    Dispense:  18 g    Refill:  2   amoxicillin-clavulanate (AUGMENTIN) 875-125 MG tablet    Sig: Take 1 tablet by mouth 2 (two) times daily.    Dispense:  14 tablet    Refill:  0    Follow-up: Return if symptoms worsen or fail to improve.    [name], MD

## 2021-11-18 ENCOUNTER — Inpatient Hospital Stay: Payer: Medicare Other

## 2021-11-18 ENCOUNTER — Other Ambulatory Visit: Payer: Self-pay | Admitting: *Deleted

## 2021-11-18 ENCOUNTER — Inpatient Hospital Stay: Payer: Medicare Other | Admitting: Hematology and Oncology

## 2021-11-18 DIAGNOSIS — C50412 Malignant neoplasm of upper-outer quadrant of left female breast: Secondary | ICD-10-CM

## 2021-11-19 ENCOUNTER — Inpatient Hospital Stay: Payer: Medicare Other

## 2021-11-19 ENCOUNTER — Other Ambulatory Visit: Payer: Self-pay

## 2021-11-19 ENCOUNTER — Ambulatory Visit (HOSPITAL_COMMUNITY)
Admission: RE | Admit: 2021-11-19 | Discharge: 2021-11-19 | Disposition: A | Discharge status: Home / Self Care | Payer: Medicare Other | Source: Ambulatory Visit | Attending: Hematology and Oncology | Admitting: Hematology and Oncology

## 2021-11-19 ENCOUNTER — Encounter: Payer: Self-pay | Admitting: Hematology and Oncology

## 2021-11-19 ENCOUNTER — Inpatient Hospital Stay: Payer: Medicare Other | Attending: Hematology and Oncology | Admitting: Hematology and Oncology

## 2021-11-19 VITALS — BP 144/68 | HR 82 | Temp 97.0°F | Resp 19 | Wt 262.1 lb

## 2021-11-19 DIAGNOSIS — C50412 Malignant neoplasm of upper-outer quadrant of left female breast: Secondary | ICD-10-CM | POA: Diagnosis not present

## 2021-11-19 DIAGNOSIS — Z5111 Encounter for antineoplastic chemotherapy: Secondary | ICD-10-CM | POA: Diagnosis not present

## 2021-11-19 DIAGNOSIS — Z170 Estrogen receptor positive status [ER+]: Secondary | ICD-10-CM

## 2021-11-19 DIAGNOSIS — C50912 Malignant neoplasm of unspecified site of left female breast: Secondary | ICD-10-CM | POA: Diagnosis not present

## 2021-11-19 DIAGNOSIS — C50411 Malignant neoplasm of upper-outer quadrant of right female breast: Secondary | ICD-10-CM | POA: Diagnosis not present

## 2021-11-19 DIAGNOSIS — J189 Pneumonia, unspecified organism: Secondary | ICD-10-CM | POA: Diagnosis not present

## 2021-11-19 LAB — CBC WITH DIFFERENTIAL (CANCER CENTER ONLY)
Abs Immature Granulocytes: 0.01 10*3/uL (ref 0.00–0.07)
Basophils Absolute: 0 10*3/uL (ref 0.0–0.1)
Basophils Relative: 1 %
Eosinophils Absolute: 0.1 10*3/uL (ref 0.0–0.5)
Eosinophils Relative: 4 %
HCT: 41.5 % (ref 36.0–46.0)
Hemoglobin: 12.8 g/dL (ref 12.0–15.0)
Immature Granulocytes: 0 %
Lymphocytes Relative: 21 %
Lymphs Abs: 0.6 10*3/uL — ABNORMAL LOW (ref 0.7–4.0)
MCH: 28.3 pg (ref 26.0–34.0)
MCHC: 30.8 g/dL (ref 30.0–36.0)
MCV: 91.6 fL (ref 80.0–100.0)
Monocytes Absolute: 0.3 10*3/uL (ref 0.1–1.0)
Monocytes Relative: 10 %
Neutro Abs: 1.7 10*3/uL (ref 1.7–7.7)
Neutrophils Relative %: 64 %
Platelet Count: 69 10*3/uL — ABNORMAL LOW (ref 150–400)
RBC: 4.53 MIL/uL (ref 3.87–5.11)
RDW: 14.5 % (ref 11.5–15.5)
WBC Count: 2.6 10*3/uL — ABNORMAL LOW (ref 4.0–10.5)
nRBC: 0 % (ref 0.0–0.2)

## 2021-11-19 LAB — CMP (CANCER CENTER ONLY)
ALT: 13 U/L (ref 0–44)
AST: 28 U/L (ref 15–41)
Albumin: 3.8 g/dL (ref 3.5–5.0)
Alkaline Phosphatase: 102 U/L (ref 38–126)
Anion gap: 9 (ref 5–15)
BUN: 16 mg/dL (ref 8–23)
CO2: 26 mmol/L (ref 22–32)
Calcium: 10 mg/dL (ref 8.9–10.3)
Chloride: 106 mmol/L (ref 98–111)
Creatinine: 0.83 mg/dL (ref 0.44–1.00)
GFR, Estimated: >60 mL/min (ref >60)
Glucose, Bld: 221 mg/dL — ABNORMAL HIGH (ref 70–99)
Potassium: 3.7 mmol/L (ref 3.5–5.1)
Sodium: 141 mmol/L (ref 135–145)
Total Bilirubin: 0.7 mg/dL (ref 0.3–1.2)
Total Protein: 6.9 g/dL (ref 6.5–8.1)

## 2021-11-19 MED ORDER — FULVESTRANT 250 MG/5ML IM SOSY
500.0000 mg | PREFILLED_SYRINGE | Freq: Once | INTRAMUSCULAR | Status: AC
  Administered 2021-11-19: 500 mg via INTRAMUSCULAR
  Filled 2021-11-19: qty 10

## 2021-11-19 NOTE — Progress Notes (Signed)
[place]  [phone] [phone]     ID: [name] [name] DOB: [birth year]  MR#: [id number]  KCL#:[id number]  Patient Care Team: [name], MD as PCP - General (Internal Medicine) [name], RN as Oncology Nurse Navigator [name], RN as Oncology Nurse Navigator [name], MD as Consulting Physician (Hematology and Oncology) Neldon Labella, RN as Case Manager OTHER MD:  CHIEF COMPLAINT: Estrogen receptor positive invasive breast cancer  CURRENT TREATMENT: anastrozole  BREAST CANCER HISTORY:  Oncology History  Malignant neoplasm of upper-outer quadrant of left breast in female, estrogen receptor positive ([place])  02/02/2015 - 02/2021 Anti-estrogen oral therapy   Anastrozole daily, completed 6 years   08/04/2015 Surgery   71 y.o. [name] woman status post left breast upper outer quadrant lumpectomy 08/04/2015 for a pT1c pNX, stage I A invasive ductal carcinoma, estrogen and progesterone receptor positive, HER-2 not amplified, with no HER-2 amplification, anterior margin was focally positive   08/04/2015 Oncotype testing   Oncotype DX score of 12 predicts a 10 year risk of recurrence outside the breast of 8% if the patient's only systemic therapy is tamoxifen for 5 years. It also predicts no benefit from therapy.      09/12/2015 Surgery   additional Left breast surgery 09/12/2015 cleared the margins and found all 5 axillary lymph nodes sampled clear for a final stage pT1c pN0, stage IA   10/22/2015 - 12/08/2015 Radiation Therapy   Adjuvant radiation 10/22/15 - 12/08/15              1) Left breast: 50.4 Gy in 28 fractions              2) Left breast boost: 10 Gy in 5 fractions   10/26/2021 Cancer Staging   Staging form: Breast, AJCC 7th Edition - Pathologic: Stage IA (T1c, N0, cM0) - Signed by Gardenia Phlegm, NP on 10/26/2021   Malignant neoplasm of right breast ([place])  06/10/2021 Mammogram   asymmetry within the OUTER  RIGHT breast on CC views warrants further evaluation.   Targeted ultrasound : showing hypoechoic oval mass with indistinct margins in the 10:30 o'clock location of the RIGHT breast 9 centimeters from nipple measuring 0.5 x 0.4 x 0.4 centimeters. This area is approximately 1.8 centimeters from the palpable bruise, bruise was thought to be fat necrosis.   06/23/2021 Initial Diagnosis   Right breast needle core biopsy IDC, grade one, ER 90% positive, PR 60% positive, Ki67 2%, HER-2 IHC 2+, FISH negative   07/08/2021 Treatment Plan Change   Faslodex every 4 weeks, Abemaciclib to be added    08/12/2021 Surgery   Right lumpectomy: invasive ductal carcinoma with clear margins of resection, final pathology showed tumor measuring 1.1 cm x 0.7 x 8.7,  grade 3   previous biopsy showed ER 90% positive, PR 60% positive, Ki-67 was 2% HER2 2+ by IHC and negative by [facility]   08/12/2021 Oncotype testing   12    Interval History  Patient is here for follow-up.   Since last visit, she was treated for pneumonia twice.  She took antibiotics, Z-Pak as well as amoxicillin.  She just finished these antibiotics last week.  She continues to have severe cough, does not feel well overall.  No fevers or chills reported.  She is here for her Faslodex injection. Rest of the pertinent 10 point ROS reviewed and negative  PAST MEDICAL HISTORY: Past Medical History:  Diagnosis Date   Abdominal wall contusion 12/09/2011  Achilles tendon contracture, bilateral 06/03/2016   Anxiety    Arthritis    Blood dyscrasia    low platelet count- sees [name] , [name] ( Note in EPIC from 07/2014) at [place]) 2017   Left Breast   Breast cancer ([place]) 07/2021   right breast IDC   Cancer ([place])    left breast   Chest wall contusion 12/09/2011   Cirrhosis of liver ([place])    Cough    Diabetes mellitus without complication ([place])    Diabetic neuropathy ([place])    Diarrhea due to drug  01/29/2021   Dyspnea    Gout    Gout    History of radiation therapy 10/22/15 - 12/08/15   left breast 50.4 Gy, boost to 10 Gy   Hypertension    Hypothyroidism    Migraine    Multifactorial gait disorder 01/12/2013   Neuromuscular disorder ([place])    diabetic neuropathy   Neuropathy    Personal history of radiation therapy 2017   Left Breast Cancer   Posterior tibial tendon dysfunction (PTTD) of right lower extremity 10/09/2019   Sleep apnea    CPAP nightly   Spleen enlarged    Thyroid disease     PAST SURGICAL HISTORY: Past Surgical History:  Procedure Laterality Date   ANKLE FUSION Right 2022   BREAST BIOPSY Left 08/04/2015   Procedure: LEFT BREAST BIOPSY WITH NEEDLE LOCALIZATION;  Surgeon: [name], MD;  Location: [place];  Service: General;  Laterality: Left;   BREAST BIOPSY Right 06/23/2021   BREAST DUCTAL SYSTEM EXCISION Left 08/04/2015   Procedure: LEFT EXCISION DUCTAL SYSTEM BREAST;  Surgeon: [name], MD;  Location: [place];  Service: General;  Laterality: Left;   BREAST LUMPECTOMY Left 08/2015   BREAST LUMPECTOMY WITH AXILLARY LYMPH NODE BIOPSY Left 09/12/2015   Procedure: RE-EXCISION OF LEFT BREAST LUMPECTOMY WITH LEFT AXILLARY LYMPH NODE BIOPSY;  Surgeon: [name], MD;  Location: [place];  Service: General;  Laterality: Left;   BREAST LUMPECTOMY WITH RADIOACTIVE SEED LOCALIZATION Right 08/12/2021   Procedure: RIGHT BREAST LUMPECTOMY WITH RADIOACTIVE SEED LOCALIZATION;  Surgeon: Coralie Keens, MD;  Location: [place];  Service: General;  Laterality: Right;  LMA   CHOLECYSTECTOMY     EYE SURGERY Bilateral 2022   Lashes growing backwards into the eye   JOINT REPLACEMENT     left knee   JOINT REPLACEMENT  2017   right knee   REVERSE SHOULDER ARTHROPLASTY Right 09/07/2018   Procedure: RIGHT REVERSE SHOULDER ARTHROPLASTY;  Surgeon: Meredith Pel, MD;  Location: [place];  Service:  Orthopedics;  Laterality: Right;   TOTAL KNEE ARTHROPLASTY Right 02/28/2015   Procedure: RIGHT TOTAL KNEE ARTHROPLASTY;  Surgeon: [name], MD;  Location: [facility];  Service: Orthopedics;  Laterality: Right;   TUBAL LIGATION      FAMILY HISTORY Family History  Problem Relation Age of Onset   Hypothyroidism Mother    Alzheimer's disease Mother    Diabetes Father    Cancer Sister        GYN cancer   Hypothyroidism Sister    Heart disease Brother    Arthritis Son    Diabetes Son    Arthritis Son    Cancer Maternal Grandmother    Heart failure Maternal Grandfather    Bone cancer Paternal Grandmother    Heart failure Paternal Grandmother   The patient's father died from an encephalitis  at the age of 27. The patient's mother is living at age 82. The patient had one brother, 3 sisters. A paternal grandf mother had "bone cancer". A paternal aunt, present today also had "bone cancer", but on further questioning this was myeloma. The patient's sister [name] was diagnosed with ovarian and uterine cancer and has been genetically tested but the test was negative   GYNECOLOGIC HISTORY:  No LMP recorded. Patient is postmenopausal. Menarche age 49 and first live birth age 57, menopause in her 18s. The patient never took hormone replacement or oral contraceptives.   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 Her husband [name] is a Games developer. At home is just the 2 of them, the patient's aunt who is under hospice care for her myeloma, and the patient's dog. Son [name] lives in [place] and works for an Scientist, research (medical).Pandora Leiter Jeneen Rinks to be also lives in [place] and is a Tax inspector. Daughter Alinda Money lives in [place] and works for the [employer] school system as F Careers information officer. The patient has 5 grandchildren. She attends a [employer].    ADVANCED DIRECTIVES: In the  absence of any documentation to the contrary, the patient's spouse is their HCPOA.    HEALTH MAINTENANCE: Social History   Tobacco Use   Smoking status: Never   Smokeless tobacco: Never  Vaping Use   Vaping Use: Never used  Substance Use Topics   Alcohol use: No   Drug use: No     Colonoscopy: 2015/Buccini  PAP:  Bone density:   Allergies  Allergen Reactions   Codeine Swelling   Influenza A (H1n1) Monoval Pf Anaphylaxis   Hydrocodone-Acetaminophen Other (See Comments)   Lisinopril Other (See Comments)   Metformin Other (See Comments)   Pregabalin Other (See Comments)   Jardiance [Empagliflozin] Other (See Comments)    dehydration    Current Outpatient Medications  Medication Sig Dispense Refill   ACCU-CHEK AVIVA PLUS test strip USE AS INSTRUCTED TO CHECK BLOOD SUGAR 2 TIMES DAILY 100 strip 5   acetaminophen (TYLENOL) 500 MG tablet Take by mouth.     albuterol (VENTOLIN HFA) 108 (90 Base) MCG/ACT inhaler Inhale 1-2 puffs into the lungs every 4 (four) hours as needed for wheezing or shortness of breath. 18 g 2   amoxicillin-clavulanate (AUGMENTIN) 875-125 MG tablet Take 1 tablet by mouth 2 (two) times daily. 14 tablet 0   Ascorbic Acid (VITAMIN C PO) Take by mouth daily.     buPROPion (WELLBUTRIN XL) 150 MG 24 hr tablet Take 1 tablet (150 mg total) by mouth every morning. 90 tablet 1   citalopram (CELEXA) 40 MG tablet TAKE 1 TABLET BY MOUTH EVERY DAY 90 tablet 1   colchicine 0.6 MG tablet Take 2 tablets once today, followed by 1 tablet 1 hour later, and then take 1 tablet once daily from Day 2. 20 tablet 0   Cyanocobalamin (VITAMIN B-12 PO) Take by mouth daily.     dapagliflozin propanediol (FARXIGA) 10 MG TABS tablet Take 1 tablet (10 mg total) by mouth daily before breakfast. 90 tablet 3   fluticasone (FLONASE) 50 MCG/ACT nasal spray Place 1 spray into both nostrils as needed.      gabapentin (NEURONTIN) 600 MG tablet TAKE 1 TABLET IN THE MORNING AND 1 TABLET IN THE  AFTERNOON AND 2TABS AT BEDTIME     insulin aspart (FIASP FLEXTOUCH) 100 UNIT/ML FlexTouch Pen Inject 18  Units into the skin with breakfast, with lunch, and with evening meal. 45 mL 3   Insulin Glargine (BASAGLAR KWIKPEN) 100 UNIT/ML Inject 60 Units into the skin daily. 60 mL 3   Insulin Pen Needle (B-D UF III MINI PEN NEEDLES) 31G X 5 MM MISC USE AS DIRECTED WITH INSULIN EVERY MORNING, NOON, EVENING AND AT BEDTIME 400 each 3   Insulin Pen Needle 30G X 5 MM MISC 1 Device by Does not apply route in the morning, at noon, in the evening, and at bedtime. 400 each 3   ketoconazole (NIZORAL) 2 % cream Apply 1 Application topically daily. 60 g 0   levothyroxine (SYNTHROID) 150 MCG tablet Take 1 tablet (150 mcg total) by mouth daily. 90 tablet 3   liraglutide (VICTOZA) 18 MG/3ML SOPN Inject 1.8 mg into the skin daily. 27 mL 3   losartan (COZAAR) 25 MG tablet Take 1 tablet (25 mg total) by mouth daily. 90 tablet 1   methocarbamol (ROBAXIN) 500 MG tablet Take 1 tablet (500 mg total) by mouth every 8 (eight) hours as needed for muscle spasms. 30 tablet 0   Misc Natural Products (TART CHERRY ADVANCED) CAPS Take 1 capsule by mouth daily.     Multiple Vitamins-Minerals (MULTIVITAMIN WITH MINERALS) tablet Take 1 tablet by mouth daily.     mycophenolate (CELLCEPT) 500 MG tablet Take 1,500 mg by mouth 2 (two) times daily.     promethazine-dextromethorphan (PROMETHAZINE-DM) 6.25-15 MG/5ML syrup Take 5 mLs by mouth 4 (four) times daily as needed for cough. 473 mL 0   Pyridoxine HCl (VITAMIN B-6 PO) Take by mouth daily.     VITAMIN D PO Take by mouth daily.     No current facility-administered medications for this visit.    OBJECTIVE: White woman examined in a wheelchair  Vitals:   11/19/21 0816  BP: (!) 144/68  Pulse: 82  Resp: 19  Temp: (!) 97 F (36.1 C)  SpO2: 95%     Wt Readings from Last 3 Encounters:  11/19/21 262 lb 1 oz (118.9 kg)  10/26/21 261 lb 3.2 oz (118.5 kg)  10/12/21 259 lb 6.4 oz  (117.7 kg)   Body mass index is 49.52 kg/m.    ECOG FS:2 - Symptomatic, <50% confined to bed  Physical Exam Constitutional:      Appearance: Normal appearance.     Comments:    Cardiovascular:     Rate and Rhythm: Normal rate and regular rhythm.     Pulses: Normal pulses.     Heart sounds: Normal heart sounds.  Pulmonary:     Comments: No overt adventitious sounds.  Was very hard to appreciate her breathing sounds and given her body habitus and inability to take deep breaths. Chest:     Comments: Breast exam deferred today Musculoskeletal:     Cervical back: Normal range of motion and neck supple. No rigidity.     Comments: She is in a wheelchair today  Lymphadenopathy:     Cervical: No cervical adenopathy.  Neurological:     Mental Status: She is alert.      LAB RESULTS:  CMP     Component Value Date/Time   NA 136 08/10/2021 0956   NA 138 07/05/2018 1635   NA 142 10/06/2016 1211   K 4.0 08/10/2021 0956   K 3.8 10/06/2016 1211   CL 99 08/10/2021 0956   CO2 23 08/10/2021 0956   CO2 25 10/06/2016 1211   GLUCOSE 146 (H) 08/10/2021 4765  GLUCOSE 179 (H) 10/06/2016 1211   BUN 15 08/10/2021 0956   BUN 19 07/05/2018 1635   BUN 16.9 10/06/2016 1211   CREATININE 0.91 08/10/2021 0956   CREATININE 1.19 (H) 12/25/2019 1315   CREATININE 1.09 (H) 09/28/2017 1516   CREATININE 1.1 10/06/2016 1211   CALCIUM 10.4 (H) 08/10/2021 0956   CALCIUM 10.4 10/06/2016 1211   PROT 7.5 12/25/2019 1315   PROT 7.5 07/05/2018 1635   PROT 7.6 10/06/2016 1211   ALBUMIN 3.5 12/25/2019 1315   ALBUMIN 4.6 07/05/2018 1635   ALBUMIN 3.9 10/06/2016 1211   AST 33 12/25/2019 1315   AST 33 10/06/2016 1211   ALT 18 12/25/2019 1315   ALT 20 10/06/2016 1211   ALKPHOS 118 12/25/2019 1315   ALKPHOS 86 10/06/2016 1211   BILITOT 0.4 12/25/2019 1315   BILITOT 0.52 10/06/2016 1211   GFRNONAA >60 08/10/2021 0956   GFRNONAA 49 (L) 12/25/2019 1315   GFRNONAA 52 (L) 09/28/2017 1516   GFRAA >60  11/07/2018 1133   GFRAA 61 09/28/2017 1516    INo results found for: "SPEP", "UPEP"  Lab Results  Component Value Date   WBC 2.6 (L) 11/19/2021   NEUTROABS 1.7 11/19/2021   HGB 12.8 11/19/2021   HCT 41.5 11/19/2021   MCV 91.6 11/19/2021   PLT 69 (L) 11/19/2021      Chemistry      Component Value Date/Time   NA 136 08/10/2021 0956   NA 138 07/05/2018 1635   NA 142 10/06/2016 1211   K 4.0 08/10/2021 0956   K 3.8 10/06/2016 1211   CL 99 08/10/2021 0956   CO2 23 08/10/2021 0956   CO2 25 10/06/2016 1211   BUN 15 08/10/2021 0956   BUN 19 07/05/2018 1635   BUN 16.9 10/06/2016 1211   CREATININE 0.91 08/10/2021 0956   CREATININE 1.19 (H) 12/25/2019 1315   CREATININE 1.09 (H) 09/28/2017 1516   CREATININE 1.1 10/06/2016 1211      Component Value Date/Time   CALCIUM 10.4 (H) 08/10/2021 0956   CALCIUM 10.4 10/06/2016 1211   ALKPHOS 118 12/25/2019 1315   ALKPHOS 86 10/06/2016 1211   AST 33 12/25/2019 1315   AST 33 10/06/2016 1211   ALT 18 12/25/2019 1315   ALT 20 10/06/2016 1211   BILITOT 0.4 12/25/2019 1315   BILITOT 0.52 10/06/2016 1211       No results found for: "LABCA2"  No components found for: "LABCA125"  No results for input(s): "INR" in the last 168 hours.  Urinalysis    Component Value Date/Time   COLORURINE YELLOW 08/30/2018 1357   APPEARANCEUR CLEAR 08/30/2018 1357   LABSPEC 1.024 08/30/2018 1357   PHURINE 5.0 08/30/2018 1357   GLUCOSEU >=500 (A) 08/30/2018 1357   HGBUR NEGATIVE 08/30/2018 1357   BILIRUBINUR NEGATIVE 08/30/2018 1357   KETONESUR NEGATIVE 08/30/2018 1357   PROTEINUR NEGATIVE 08/30/2018 1357   UROBILINOGEN 0.2 12/09/2011 1907   NITRITE NEGATIVE 08/30/2018 1357   LEUKOCYTESUR NEGATIVE 08/30/2018 1357    STUDIES: DG Chest 2 View  Result Date: 10/26/2021 CLINICAL DATA:  cough, fever, eval for pneumonia EXAM: CHEST - 2 VIEW COMPARISON:  None Available. FINDINGS: Unchanged cardiomediastinal silhouette. There are patchy lower lung  airspace opacities. Bronchial wall thickening. Prior right shoulder arthroplasty. Left shoulder degenerative change. Thoracic spondylosis. No pleural effusion. No pneumothorax. IMPRESSION: Findings suggestive of bronchitis with faint lower lung airspace opacities which could represent developing pneumonia. Recommend follow-up PA and lateral chest radiograph in 6-12 weeks after treatment. Electronically Signed  By: Maurine Simmering M.D.   On: 10/26/2021 13:30     ELIGIBLE FOR AVAILABLE RESEARCH PROTOCOL: no  ASSESSMENT: 71 y.o. [name] status post left breast upper outer quadrant lumpectomy 08/04/2015 for a pT1c pNX, stage I A invasive ductal carcinoma, estrogen and progesterone receptor positive, HER-2 not amplified, with no HER-2 amplification.  (a) anterior margin was focally positive  (1) additional Left breast surgery 09/12/2015 cleared the margins and found all 5 axillary lymph nodes sampled clear for a final stage pT1c pN0, stage IA  (2) Oncotype DX score of 12 predicts a 10 year risk of recurrence outside the breast of 8% if the patient's only systemic therapy is tamoxifen for 5 years. It also predicts no benefit from therapy.   (3) Adjuvant radiation 10/22/15 - 12/08/15   1) Left breast: 50.4 Gy in 28 fractions              2) Left breast boost: 10 Gy in 5 fractions  (4) started  anastrozole 02/02/2015, completed 6 yrs.  (5) She has mammogram recently 06/20/2021, possible asymmetry within the OUTER RIGHT breast on CC views warrants further evaluation. Targeted ultrasound is performed, showing hypoechoic oval mass with indistinct margins in the 10:30 o'clock location of the RIGHT breast 9 centimeters from nipple measuring 0.5 x 0.4 x 0.4 centimeters. This area is approximately 1.8 centimeters from the palpable bruise, bruise was thought to be fat necrosis.  Right breast lumpectomy showed invasive ductal carcinoma with clear margins of resection, final pathology showed tumor  measuring 1.1 cm x 0.7 x 8.7 grade 3 previous biopsy showed ER 90% positive, PR 60% positive, Ki-67 was 2% HER2 2+ by IHC and negative by FISH  PLAN:  She is here for follow-up.  She is currently on adjuvant Faslodex.  She is doing quite well with the injections except for soreness at the site of injection.  In the past we have discussed about considering abemaciclib but given her multiple comorbidities, borderline performance status, we will defer this to a later date.  We have also discussed about alternate of antiestrogen therapy with tamoxifen but once again she is very sedentary hence she [place] have high risk of blood clots.  She is comfortable with the injections and will continue Faslodex as recommended for 5 years.  She finished radiation about 2 or 3 weeks ago. With regards to the pneumonia, since I could not appreciate her chest exam very well, I have ordered a repeat x-ray.  She will get this done after her Faslodex injection.  She can continue taking the [facility] for cough as needed.  If she continues to have respiratory symptoms and if her chest x-ray is unremarkable, we can consider CT chest without contrast. She will return to clinic in about 3 months for follow-up or sooner as needed  Faslodex every 28 days. Total time spent 30 minutes   *Total Encounter Time as defined by the Centers for Medicare and Medicaid Services includes, in addition to the face-to-face time of a patient visit (documented in the note above) non-face-to-face time: obtaining and reviewing outside history, ordering and reviewing medications, tests or procedures, care coordination (communications with other health care professionals or caregivers) and documentation in the medical record.

## 2021-11-25 ENCOUNTER — Encounter: Payer: Self-pay | Admitting: Radiation Oncology

## 2021-11-27 ENCOUNTER — Other Ambulatory Visit: Payer: Self-pay | Admitting: *Deleted

## 2021-11-27 DIAGNOSIS — R053 Chronic cough: Secondary | ICD-10-CM

## 2021-11-27 MED ORDER — PROMETHAZINE-DM 6.25-15 MG/5ML PO SYRP: 5.0000 mL | ORAL_SOLUTION | Freq: Four times a day (QID) | ORAL | 0 refills | Status: DC | PRN

## 2021-11-29 NOTE — Progress Notes (Incomplete)
Radiation Oncology         [phone]) [phone] ________________________________  Name: [name] [name] MRN: [id number]  Date: 11/30/2021  DOB: 1950/11/20  Follow-Up Visit Note  CC: [name], MD  [name], MD  No diagnosis found.  Diagnosis:  S/p lumpectomy: Stage IA Right Breast UOQ, Invasive Ductal Carcinoma, ER+ / PR+ / Her2-, Grade 2   History of Stage IA (cT1c, cN0, cM0) Left breast Invasive Ductal Carcinoma diagnosed in 2017; s/p lumpectomy, radiation, and antiestrogens (anastrozle)    Interval Since Last Radiation: 1 month and 1 day  Intent: Curative  Radiation Treatment Dates: 09/22/2021 through 10/30/2021 Site Technique Total Dose (Gy) Dose per Fx (Gy) Completed Fx Beam Energies  Breast, Right: Breast_R 3D 45/45 1.8 25/25 15X, 10XFFF  Breast, Right: Breast_R_Bst 3D 6/6 2 3/3 10X, 15X   Adjuvant radiation 10/22/15 - 12/08/15              1) Left breast: 50.4 Gy in 28 fractions              2) Left breast boost: 10 Gy in 5 fractions  Narrative:  The patient returns today for routine follow-up. The patient tolerated radiation therapy relatively well. During her final weekly treatment check on 10/29/21, the patient reported right axillary pain, fatigue, skin irritation, and mild swelling and tenderness. Physical exam performed on that same date revealed: an erythematous rash consistent with a yeast infection to the lateral left breast inframammary fold, hyperpigmentation changes and diffuse erythema to the right breast area, and minimal skin breakdown in the inferior aspect of the right breast area. She stated that her husband helped her place antibiotic ointment in the areas of skin breakdown. She was also given antifungal powder to place along the inframammary fold of the left breast.   Since completing XRT, the patient followed up with Dr. Chryl Heck on 11/19/21. Following discussion, the patient agreed to continue Faslodex as recommended for 5 years. She is tolerating faslodex  well without significant side effects. The patient also reported being treated for pneumonia recently, and endorsed a persistent severe cough despite finishing her abx the prior week. Dr. Chryl Heck ordered a repeat chest x-ray which showed no active cardiopulmonary disease, and advised her to continue taking Tessalon Perles for cough as needed.        Other imaging performed in the interval includes a CT of the pelvis without contrast on 09/2621 (for evaluation of left hip pain in the ED) which showed no acute abnormality of the bony pelvis or the left hip to account for the patient's symptoms.                         ***  Allergies:  is allergic to codeine, influenza a (h1n1) monoval pf, hydrocodone-acetaminophen, lisinopril, metformin, pregabalin, and jardiance [empagliflozin].  Meds: Current Outpatient Medications  Medication Sig Dispense Refill   ACCU-CHEK AVIVA PLUS test strip USE AS INSTRUCTED TO CHECK BLOOD SUGAR 2 TIMES DAILY 100 strip 5   acetaminophen (TYLENOL) 500 MG tablet Take by mouth.     albuterol (VENTOLIN HFA) 108 (90 Base) MCG/ACT inhaler Inhale 1-2 puffs into the lungs every 4 (four) hours as needed for wheezing or shortness of breath. 18 g 2   amoxicillin-clavulanate (AUGMENTIN) 875-125 MG tablet Take 1 tablet by mouth 2 (two) times daily. 14 tablet 0   Ascorbic Acid (VITAMIN C PO) Take by mouth daily.     buPROPion (WELLBUTRIN XL) 150 MG  24 hr tablet Take 1 tablet (150 mg total) by mouth every morning. 90 tablet 1   citalopram (CELEXA) 40 MG tablet TAKE 1 TABLET BY MOUTH EVERY DAY 90 tablet 1   colchicine 0.6 MG tablet Take 2 tablets once today, followed by 1 tablet 1 hour later, and then take 1 tablet once daily from Day 2. 20 tablet 0   Cyanocobalamin (VITAMIN B-12 PO) Take by mouth daily.     dapagliflozin propanediol (FARXIGA) 10 MG TABS tablet Take 1 tablet (10 mg total) by mouth daily before breakfast. 90 tablet 3   fluticasone (FLONASE) 50 MCG/ACT nasal spray Place 1  spray into both nostrils as needed.      gabapentin (NEURONTIN) 600 MG tablet TAKE 1 TABLET IN THE MORNING AND 1 TABLET IN THE AFTERNOON AND 2TABS AT BEDTIME     insulin aspart (FIASP FLEXTOUCH) 100 UNIT/ML FlexTouch Pen Inject 18 Units into the skin with breakfast, with lunch, and with evening meal. 45 mL 3   Insulin Glargine (BASAGLAR KWIKPEN) 100 UNIT/ML Inject 60 Units into the skin daily. 60 mL 3   Insulin Pen Needle (B-D UF III MINI PEN NEEDLES) 31G X 5 MM MISC USE AS DIRECTED WITH INSULIN EVERY MORNING, NOON, EVENING AND AT BEDTIME 400 each 3   Insulin Pen Needle 30G X 5 MM MISC 1 Device by Does not apply route in the morning, at noon, in the evening, and at bedtime. 400 each 3   ketoconazole (NIZORAL) 2 % cream Apply 1 Application topically daily. 60 g 0   levothyroxine (SYNTHROID) 150 MCG tablet Take 1 tablet (150 mcg total) by mouth daily. 90 tablet 3   liraglutide (VICTOZA) 18 MG/3ML SOPN Inject 1.8 mg into the skin daily. 27 mL 3   losartan (COZAAR) 25 MG tablet Take 1 tablet (25 mg total) by mouth daily. 90 tablet 1   methocarbamol (ROBAXIN) 500 MG tablet Take 1 tablet (500 mg total) by mouth every 8 (eight) hours as needed for muscle spasms. 30 tablet 0   Misc Natural Products (TART CHERRY ADVANCED) CAPS Take 1 capsule by mouth daily.     Multiple Vitamins-Minerals (MULTIVITAMIN WITH MINERALS) tablet Take 1 tablet by mouth daily.     mycophenolate (CELLCEPT) 500 MG tablet Take 1,500 mg by mouth 2 (two) times daily.     promethazine-dextromethorphan (PROMETHAZINE-DM) 6.25-15 MG/5ML syrup Take 5 mLs by mouth 4 (four) times daily as needed for cough. 473 mL 0   Pyridoxine HCl (VITAMIN B-6 PO) Take by mouth daily.     VITAMIN D PO Take by mouth daily.     No current facility-administered medications for this encounter.    Physical Findings: The patient is in no acute distress. Patient is alert and oriented.  vitals were not taken for this visit. .  No significant changes. Lungs are  clear to auscultation bilaterally. Heart has regular rate and rhythm. No palpable cervical, supraclavicular, or axillary adenopathy. Abdomen soft, non-tender, normal bowel sounds.  Left Breast: no palpable mass, nipple discharge or bleeding. Right breast: ***  Lab Findings: Lab Results  Component Value Date   WBC 2.6 (L) 11/19/2021   HGB 12.8 11/19/2021   HCT 41.5 11/19/2021   MCV 91.6 11/19/2021   PLT 69 (L) 11/19/2021    Radiographic Findings: DG Chest 2 View  Result Date: 11/19/2021 CLINICAL DATA:  Left breast cancer.  Follow-up pneumonia. EXAM: CHEST - 2 VIEW COMPARISON:  10/26/2021 chest radiograph. FINDINGS: Partially visualized right total shoulder arthroplasty. Stable  cardiomediastinal silhouette with normal heart size. No pneumothorax. No pleural effusion. Lungs appear clear, with no acute consolidative airspace disease and no pulmonary edema. Surgical clips overlie the outer left breast. IMPRESSION: No active cardiopulmonary disease. Electronically Signed   By: Ilona Sorrel M.D.   On: 11/19/2021 10:23    Impression:  S/p lumpectomy: Stage IA Right Breast UOQ, Invasive Ductal Carcinoma, ER+ / PR+ / Her2-, Grade 2   History of Stage IA (cT1c, cN0, cM0) Left breast Invasive Ductal Carcinoma diagnosed in 2017; s/p lumpectomy, radiation, and antiestrogens (anastrozle)    The patient is recovering from the effects of radiation.  ***  Plan:  ***   *** minutes of total time was spent for this patient encounter, including preparation, face-to-face counseling with the patient and coordination of care, physical exam, and documentation of the encounter. ____________________________________  Blair Promise, PhD, MD  This document serves as a record of services personally performed by Gery Pray, MD. It was created on his behalf by Roney Mans, a trained medical scribe. The creation of this record is based on the scribe's personal observations and the provider's statements to  them. This document has been checked and approved by the attending provider.

## 2021-11-29 NOTE — Progress Notes (Incomplete)
  Radiation Oncology         [phone] ________________________________  Patient Name: [name] [name] MRN: [id number] DOB: 20-Dec-1950 Referring Physician: [name] Date of Service: 10/30/2021 [facility], [place]                                                        End Of Treatment Note  Diagnoses: C50.411-Malignant neoplasm of upper-outer quadrant of right female breast Z17.0-Estrogen receptor positive status [ER+]  Cancer Staging: S/p lumpectomy: Stage IA Right Breast UOQ, Invasive Ductal Carcinoma, ER+ / PR+ / Her2-, Grade 2   History of Stage IA (cT1c, cN0, cM0) Left breast Invasive Ductal Carcinoma diagnosed in 2017; s/p lumpectomy, radiation, and antiestrogens (anastrozle)   Intent: Curative  Radiation Treatment Dates: 09/22/2021 through 10/30/2021 Site Technique Total Dose (Gy) Dose per Fx (Gy) Completed Fx Beam Energies  Breast, Right: Breast_R 3D 45/45 1.8 25/25 15X, 10XFFF  Breast, Right: Breast_R_Bst 3D 6/6 2 3/3 10X, 15X   Narrative: The patient tolerated radiation therapy relatively well. During her final weekly treatment check on 10/29/21, the patient reported right axillary pain, fatigue, skin irritation, and mild swelling and tenderness. Physical exam performed on that same date revealed: an erythematous rash consistent with a yeast infection to the lateral left breast inframammary fold, hyperpigmentation changes and diffuse erythema to the right breast area, and minimal skin breakdown in the inferior aspect of the right breast area. She stated that her husband helped her place antibiotic ointment in the areas of skin breakdown. She was also given antifungal powder to place along the inframammary fold of the left breast.  Plan: The patient will follow-up with radiation oncology in one month or sooner if necessary due to skin reaction.  ________________________________________________ -----------------------------------  Blair Promise, PhD,  MD  This document serves as a record of services personally performed by Gery Pray, MD. It was created on his behalf by Roney Mans, a trained medical scribe. The creation of this record is based on the scribe's personal observations and the provider's statements to them. This document has been checked and approved by the attending provider.

## 2021-11-30 ENCOUNTER — Telehealth: Payer: Self-pay | Admitting: *Deleted

## 2021-11-30 ENCOUNTER — Ambulatory Visit
Admission: RE | Admit: 2021-11-30 | Discharge: 2021-11-30 | Disposition: A | Discharge status: Home / Self Care | Payer: Medicare Other | Source: Ambulatory Visit | Attending: Radiation Oncology | Admitting: Radiation Oncology

## 2021-11-30 NOTE — Telephone Encounter (Signed)
RETURNED PATIENT'S PHONE CALL, SPOKE WITH PATIENT. ?

## 2021-12-07 ENCOUNTER — Other Ambulatory Visit: Payer: Self-pay | Admitting: Internal Medicine

## 2021-12-11 ENCOUNTER — Inpatient Hospital Stay: Payer: Medicare Other | Attending: Hematology and Oncology | Admitting: Adult Health

## 2021-12-11 ENCOUNTER — Telehealth: Payer: Self-pay | Admitting: *Deleted

## 2021-12-11 DIAGNOSIS — Z5111 Encounter for antineoplastic chemotherapy: Secondary | ICD-10-CM | POA: Insufficient documentation

## 2021-12-11 DIAGNOSIS — Z170 Estrogen receptor positive status [ER+]: Secondary | ICD-10-CM | POA: Insufficient documentation

## 2021-12-11 DIAGNOSIS — C50412 Malignant neoplasm of upper-outer quadrant of left female breast: Secondary | ICD-10-CM | POA: Insufficient documentation

## 2021-12-11 NOTE — Progress Notes (Incomplete)
[facility] Cancer Follow up:    Patel, Rutwik K, MD [address] [place] [address]   DIAGNOSIS: Cancer Staging  Malignant neoplasm of upper-outer quadrant of left breast in female, estrogen receptor positive (HCC) Staging form: Breast, AJCC 7th Edition - Clinical: Stage IA (T1c, N0, M0) - Signed by Magrinat, Gustav C, MD on 08/19/2015 - Pathologic: Stage IA (T1c, N0, cM0) - Signed by Causey, Lindsey Cornetto, NP on 10/26/2021   SUMMARY OF ONCOLOGIC HISTORY: Oncology History  Malignant neoplasm of upper-outer quadrant of left breast in female, estrogen receptor positive (HCC)  02/02/2015 - 02/2021 Anti-estrogen oral therapy   Anastrozole daily, completed 6 years   08/04/2015 Surgery   71 y.o. [name] status post left breast upper outer quadrant lumpectomy 08/04/2015 for a pT1c pNX, stage I A invasive ductal carcinoma, estrogen and progesterone receptor positive, HER-2 not amplified, with no HER-2 amplification, anterior margin was focally positive   08/04/2015 Oncotype testing   Oncotype DX score of 12 predicts a 10 year risk of recurrence outside the breast of 8% if the patient's only systemic therapy is tamoxifen for 5 years. It also predicts no benefit from therapy.      09/12/2015 Surgery   additional Left breast surgery 09/12/2015 cleared the margins and found all 5 axillary lymph nodes sampled clear for a final stage pT1c pN0, stage IA   10/22/2015 - 12/08/2015 Radiation Therapy   Adjuvant radiation 10/22/15 - 12/08/15              1) Left breast: 50.4 Gy in 28 fractions              2) Left breast boost: 10 Gy in 5 fractions   10/26/2021 Cancer Staging   Staging form: Breast, AJCC 7th Edition - Pathologic: Stage IA (T1c, N0, cM0) - Signed by Causey, Lindsey Cornetto, NP on 10/26/2021   Malignant neoplasm of right breast (HCC)  06/10/2021 Mammogram   asymmetry within the OUTER RIGHT breast on CC views warrants further evaluation.   Targeted ultrasound  : showing hypoechoic oval mass with indistinct margins in the 10:30 o'clock location of the RIGHT breast 9 centimeters from nipple measuring 0.5 x 0.4 x 0.4 centimeters. This area is approximately 1.8 centimeters from the palpable bruise, bruise was thought to be fat necrosis.   06/23/2021 Initial Diagnosis   Right breast needle core biopsy IDC, grade one, ER 90% positive, PR 60% positive, Ki67 2%, HER-2 IHC 2+, FISH negative   07/08/2021 Treatment Plan Change   Faslodex every 4 weeks, Abemaciclib to be added    08/12/2021 Surgery   Right lumpectomy: invasive ductal carcinoma with clear margins of resection, final pathology showed tumor measuring 1.1 cm x 0.7 x 8.7,  grade 3   previous biopsy showed ER 90% positive, PR 60% positive, Ki-67 was 2% HER2 2+ by IHC and negative by FISH   08/12/2021 Oncotype testing   12   09/22/2021 - 10/30/2021 Radiation Therapy   Site Technique Total Dose (Gy) Dose per Fx (Gy) Completed Fx Beam Energies  Breast, Right: Breast_R 3D 45/45 1.8 25/25 15X, 10XFFF  Breast, Right: Breast_R_Bst 3D 6/6 2 3/3 10X, 15X       CURRENT THERAPY:  INTERVAL HISTORY: Deja A Bellizzi 71 y.o. female returns for    Patient Active Problem List   Diagnosis Date Noted  . Anxiety disorder 08/28/2021  . Disorder of intervertebral disc of cervical spine 08/28/2021  . Family history of malignant neoplasm of   digestive organs 08/28/2021  . Malignant neoplasm of right breast ([place]) 07/08/2021  . 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 Pseudophakia of both eyes 04/01/2021  . Retinal edema 04/01/2021  . Trichiasis of eyelid of both eyes 04/01/2021  . Hypertrophy of inferior nasal turbinate 02/26/2021  . Acute bronchitis 01/30/2021  . Allergic rhinitis due to pollen 01/29/2021  . Allergy to influenza vaccine 01/29/2021  . Functional diarrhea 01/29/2021  . Gout 01/29/2021  . Hyperlipidemia 01/29/2021  . Nonalcoholic steatohepatitis (NASH) 01/29/2021  . Primary insomnia  01/29/2021  . Recurrent major depression in remission ([place]) 01/29/2021  . Unspecified cirrhosis of liver ([place]) 01/29/2021  . Arthritis of right ankle 04/14/2020  . Nasal septal deviation 03/11/2020  . Arthritis of right subtalar joint 10/09/2019  . Primary osteoarthritis of right ankle 10/09/2019  . Acquired pes planovalgus of right foot 10/09/2019  . Acquired posterior equinus of right lower extremity 10/09/2019  . Epistaxis, recurrent 09/26/2019  . OSA on CPAP 09/26/2019  . Acquired hypothyroidism 05/21/2019  . Arthritis of shoulder 09/07/2018  . Bilateral hearing loss 08/01/2018  . Vertigo 08/01/2018  . Chronic nonintractable headache 08/01/2018  . Type 2 diabetes mellitus with diabetic polyneuropathy, with long-term current use of insulin ([place]) 11/16/2016  . Bilateral lower extremity edema 09/01/2016  . Diabetic polyneuropathy associated with type 2 diabetes mellitus ([place]) 06/03/2016  . Malignant neoplasm of upper-outer quadrant of left breast in female, estrogen receptor positive ([place]) 08/19/2015  . Osteoarthritis of right knee 02/28/2015  . Status post total right knee replacement 02/28/2015  . Splenomegaly 07/30/2014  . Thrombocytopenia ([place]) 07/30/2014  . 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 Recurrent falls 01/12/2013  . Morbid obesity ([place]) 01/12/2013  . Degenerative arthritis of spine 01/12/2013  . Essential hypertension 09/05/2012    is allergic to codeine, influenza a (h1n1) monoval pf, hydrocodone-acetaminophen, lisinopril, metformin, pregabalin, and jardiance [empagliflozin].  MEDICAL HISTORY: Past Medical History:  Diagnosis Date  . Abdominal wall contusion 12/09/2011  . Achilles tendon contracture, bilateral 06/03/2016  . Anxiety   . Arthritis   . Blood dyscrasia    low platelet count- sees [name] , [name] ( Note in [place] from 07/2014) at [facility]  . Breast cancer [facility]) 2017   Left Breast  . Breast cancer ([place]) 07/2021    right breast IDC  . Cancer ([place])    left breast  . Chest wall contusion 12/09/2011  . Cirrhosis of liver ([place])   . Cough   . Diabetes mellitus without complication ([place])   . Diabetic neuropathy ([place])   . Diarrhea due to drug 01/29/2021  . Dyspnea   . Gout   . Gout   . History of radiation therapy 10/22/15 - 12/08/15   left breast 50.4 Gy, boost to 10 Gy  . History of radiation therapy    Right breast- 09/22/21-10/30/21- Dr. Gery Pray  . Hypertension   . Hypothyroidism   . Migraine   . 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 Neuromuscular disorder ([place])    diabetic neuropathy  . Neuropathy   . Personal history of radiation therapy 2017   Left Breast Cancer  . Posterior tibial tendon dysfunction (PTTD) of right lower extremity 10/09/2019  . Sleep apnea    CPAP nightly  . Spleen enlarged   . Thyroid disease     SURGICAL HISTORY: Past Surgical History:  Procedure Laterality Date  . ANKLE FUSION Right 2022  . BREAST BIOPSY Left 08/04/2015   Procedure: LEFT BREAST BIOPSY WITH NEEDLE LOCALIZATION;  Surgeon: [name], MD;  Location: [facility];  Service: General;  Laterality: Left;  . BREAST BIOPSY Right 06/23/2021  . BREAST DUCTAL SYSTEM EXCISION Left 08/04/2015   Procedure: LEFT EXCISION DUCTAL SYSTEM BREAST;  Surgeon: [name], MD;  Location: [facility];  Service: General;  Laterality: Left;  . BREAST LUMPECTOMY Left 08/2015  . BREAST LUMPECTOMY WITH AXILLARY LYMPH NODE BIOPSY Left 09/12/2015   Procedure: RE-EXCISION OF LEFT BREAST LUMPECTOMY WITH LEFT AXILLARY LYMPH NODE BIOPSY;  Surgeon: [name], MD;  Location: [facility];  Service: General;  Laterality: Left;  . BREAST LUMPECTOMY WITH RADIOACTIVE SEED LOCALIZATION Right 08/12/2021   Procedure: RIGHT BREAST LUMPECTOMY WITH RADIOACTIVE SEED LOCALIZATION;  Surgeon: Coralie Keens, MD;  Location: [facility];  Service: General;  Laterality: Right;  LMA   . CHOLECYSTECTOMY    . EYE SURGERY Bilateral 2022   Lashes growing backwards into the eye  . JOINT REPLACEMENT     left knee  . JOINT REPLACEMENT  2017   right knee  . REVERSE SHOULDER ARTHROPLASTY Right 09/07/2018   Procedure: RIGHT REVERSE SHOULDER ARTHROPLASTY;  Surgeon: Meredith Pel, MD;  Location: [facility];  Service: Orthopedics;  Laterality: Right;  . TOTAL KNEE ARTHROPLASTY Right 02/28/2015   Procedure: RIGHT TOTAL KNEE ARTHROPLASTY;  Surgeon: [name], MD;  Location: [facility];  Service: Orthopedics;  Laterality: Right;  . TUBAL LIGATION      SOCIAL HISTORY: Social History   Socioeconomic History  . Marital status: Married    Spouse name: edward  . Number of children: 3  . Years of education: 12th  . Highest education level: Not on file  Occupational History  . Occupation: Retired    Comment: [employer]  Tobacco Use  . Smoking status: Never  . Smokeless tobacco: Never  Vaping Use  . Vaping Use: Never used  Substance and Sexual Activity  . Alcohol use: No  . Drug use: No  . Sexual activity: Yes    Birth control/protection: Surgical    Comment: BTL  Other Topics Concern  . Not on file  Social History Narrative  . Not on file   Social Determinants of Health   Financial Resource Strain: Low Risk  (02/11/2021)   Overall Financial Resource Strain (CARDIA)   . Difficulty of Paying Living Expenses: Not very hard  Food Insecurity: No Food Insecurity (08/27/2021)   Hunger Vital Sign   . Worried About Charity fundraiser in the Last Year: Never true   . Ran Out of Food in the Last Year: Never true  Transportation Needs: No Transportation Needs (08/27/2021)   PRAPARE - Transportation   . Lack of Transportation (Medical): No   . Lack of Transportation (Non-Medical): No  Physical Activity: Insufficiently Active (02/11/2021)   Exercise Vital Sign   . Days of Exercise per Week: 3 days   . Minutes of Exercise per Session: 30 min  Stress: No  Stress Concern Present (02/11/2021)   [place]   . Feeling of Stress : Only a little  Social Connections: Moderately Integrated (02/11/2021)   Social Connection and Isolation Panel [NHANES]   . Frequency of Communication with Friends and Family: Three times a week   . Frequency of Social Gatherings with Friends and Family: Never   . Attends Religious Services: More than 4 times per year   . Active Member of Clubs or Organizations: No   .  Attends Club or Organization Meetings: Never   . Marital Status: Married  Intimate Partner Violence: Not At Risk (02/11/2021)   Humiliation, Afraid, Rape, and Kick questionnaire   . Fear of Current or Ex-Partner: No   . Emotionally Abused: No   . Physically Abused: No   . Sexually Abused: No    FAMILY HISTORY: Family History  Problem Relation Age of Onset  . Hypothyroidism Mother   . Alzheimer's disease Mother   . Diabetes Father   . Cancer Sister        GYN cancer  . Hypothyroidism Sister   . Heart disease Brother   . Arthritis Son   . Diabetes Son   . Arthritis Son   . Cancer Maternal Grandmother   . Heart failure Maternal Grandfather   . Bone cancer Paternal Grandmother   . Heart failure Paternal Grandmother     Review of Systems - Oncology    PHYSICAL EXAMINATION  ECOG PERFORMANCE STATUS: {CHL ONC ECOG PS:[id number]}  There were no vitals filed for this visit.  Physical Exam  LABORATORY DATA:  CBC    Component Value Date/Time   WBC 2.6 (L) 11/19/2021 0801   WBC 3.7 (L) 08/12/2021 0720   RBC 4.53 11/19/2021 0801   HGB 12.8 11/19/2021 0801   HGB 12.0 02/18/2021 1816   HGB 11.6 10/06/2016 1211   HCT 41.5 11/19/2021 0801   HCT 36.6 02/18/2021 1816   HCT 36.1 10/06/2016 1211   PLT 69 (L) 11/19/2021 0801   PLT 98 (LL) 02/18/2021 1816   MCV 91.6 11/19/2021 0801   MCV 88 02/18/2021 1816   MCV 90.7 10/06/2016 1211   MCH 28.3 11/19/2021 0801   MCHC 30.8  11/19/2021 0801   RDW 14.5 11/19/2021 0801   RDW 15.4 02/18/2021 1816   RDW 15.2 (H) 10/06/2016 1211   LYMPHSABS 0.6 (L) 11/19/2021 0801   LYMPHSABS 0.8 02/18/2021 1816   LYMPHSABS 0.8 (L) 10/06/2016 1211   MONOABS 0.3 11/19/2021 0801   MONOABS 0.2 10/06/2016 1211   EOSABS 0.1 11/19/2021 0801   EOSABS 0.1 02/18/2021 1816   BASOSABS 0.0 11/19/2021 0801   BASOSABS 0.0 02/18/2021 1816   BASOSABS 0.0 10/06/2016 1211    CMP     Component Value Date/Time   NA 141 11/19/2021 0801   NA 138 07/05/2018 1635   NA 142 10/06/2016 1211   K 3.7 11/19/2021 0801   K 3.8 10/06/2016 1211   CL 106 11/19/2021 0801   CO2 26 11/19/2021 0801   CO2 25 10/06/2016 1211   GLUCOSE 221 (H) 11/19/2021 0801   GLUCOSE 179 (H) 10/06/2016 1211   BUN 16 11/19/2021 0801   BUN 19 07/05/2018 1635   BUN 16.9 10/06/2016 1211   CREATININE 0.83 11/19/2021 0801   CREATININE 1.09 (H) 09/28/2017 1516   CREATININE 1.1 10/06/2016 1211   CALCIUM 10.0 11/19/2021 0801   CALCIUM 10.4 10/06/2016 1211   PROT 6.9 11/19/2021 0801   PROT 7.5 07/05/2018 1635   PROT 7.6 10/06/2016 1211   ALBUMIN 3.8 11/19/2021 0801   ALBUMIN 4.6 07/05/2018 1635   ALBUMIN 3.9 10/06/2016 1211   AST 28 11/19/2021 0801   AST 33 10/06/2016 1211   ALT 13 11/19/2021 0801   ALT 20 10/06/2016 1211   ALKPHOS 102 11/19/2021 0801   ALKPHOS 86 10/06/2016 1211   BILITOT 0.7 11/19/2021 0801   BILITOT 0.52 10/06/2016 1211   GFRNONAA >60 11/19/2021 0801   GFRNONAA 52 (L) 09/28/2017 1516   GFRAA >60 11/07/2018   [place] 09/28/2017 1516       PENDING LABS:   RADIOGRAPHIC STUDIES:  No results found.   PATHOLOGY:     ASSESSMENT and THERAPY PLAN:   No problem-specific Assessment & Plan notes found for this encounter.   No orders of the defined types were placed in this encounter.   All questions were answered. The patient knows to call the clinic with any problems, questions or concerns. We can certainly see the patient much  sooner if necessary. This note was electronically signed. Scot Dock, NP 12/11/2021

## 2021-12-11 NOTE — Telephone Encounter (Signed)
Called patient to inform that fu appt. has been moved to 9:45 am on 12-17-21, lvm  for a return call

## 2021-12-14 ENCOUNTER — Ambulatory Visit (INDEPENDENT_AMBULATORY_CARE_PROVIDER_SITE_OTHER): Payer: Medicare Other | Admitting: Internal Medicine

## 2021-12-14 ENCOUNTER — Encounter: Payer: Self-pay | Admitting: Internal Medicine

## 2021-12-14 VITALS — BP 134/70 | HR 84 | Ht 61.0 in | Wt 261.0 lb

## 2021-12-14 DIAGNOSIS — E1142 Type 2 diabetes mellitus with diabetic polyneuropathy: Secondary | ICD-10-CM

## 2021-12-14 DIAGNOSIS — Z794 Long term (current) use of insulin: Secondary | ICD-10-CM

## 2021-12-14 DIAGNOSIS — E039 Hypothyroidism, unspecified: Secondary | ICD-10-CM

## 2021-12-14 DIAGNOSIS — E1165 Type 2 diabetes mellitus with hyperglycemia: Secondary | ICD-10-CM

## 2021-12-14 LAB — POCT GLUCOSE (DEVICE FOR HOME USE): Glucose Fasting, POC: 145 mg/dL — AB (ref 70–99)

## 2021-12-14 LAB — POCT GLYCOSYLATED HEMOGLOBIN (HGB A1C): Hemoglobin A1C: 7.9 % — AB (ref 4.0–5.6)

## 2021-12-14 LAB — TSH: TSH: 4.28 u[IU]/mL (ref 0.35–5.50)

## 2021-12-14 MED ORDER — BD PEN NEEDLE MINI U/F 31G X 5 MM MISC: 3 refills | Status: AC

## 2021-12-14 MED ORDER — DEXCOM G6 TRANSMITTER MISC: 1.0000 | 3 refills | Status: DC

## 2021-12-14 MED ORDER — LIRAGLUTIDE 18 MG/3ML ~~LOC~~ SOPN: 1.8000 mg | PEN_INJECTOR | Freq: Every day | SUBCUTANEOUS | 3 refills | Status: DC

## 2021-12-14 MED ORDER — INSULIN PEN NEEDLE 30G X 5 MM MISC: 1.0000 | Freq: Four times a day (QID) | 3 refills | Status: AC

## 2021-12-14 MED ORDER — OMNIPOD 5 DEXG7G6 PODS GEN 5 MISC: 1.0000 | 3 refills | Status: DC

## 2021-12-14 MED ORDER — DEXCOM G6 SENSOR MISC: 1.0000 | 3 refills | Status: DC

## 2021-12-14 MED ORDER — BASAGLAR KWIKPEN 100 UNIT/ML ~~LOC~~ SOPN: 62.0000 [IU] | PEN_INJECTOR | Freq: Every day | SUBCUTANEOUS | 3 refills | Status: DC

## 2021-12-14 MED ORDER — FIASP FLEXTOUCH 100 UNIT/ML ~~LOC~~ SOPN: PEN_INJECTOR | SUBCUTANEOUS | 3 refills | Status: DC

## 2021-12-14 MED ORDER — OMNIPOD 5 DEXG7G6 INTRO GEN 5 KIT: 1.0000 | PACK | 0 refills | Status: DC

## 2021-12-14 MED ORDER — LEVOTHYROXINE SODIUM 150 MCG PO TABS: 150.0000 ug | ORAL_TABLET | Freq: Every day | ORAL | 3 refills | Status: DC

## 2021-12-14 NOTE — Patient Instructions (Signed)
-   Increase Basaglar 62 units daily  - Continue Victoza 1.8 mg daily  - Continue Farxiga 10  mg daily  - Continue  Fiasp 20 units with each meal    HOW TO TREAT LOW BLOOD SUGARS (Blood sugar LESS THAN 70 MG/DL) Please follow the RULE OF 15 for the treatment of hypoglycemia treatment (when your (blood sugars are less than 70 mg/dL)   STEP 1: Take 15 grams of carbohydrates when your blood sugar is low, which includes:  3-4 GLUCOSE TABS  OR 3-4 OZ OF JUICE OR REGULAR SODA OR ONE TUBE OF GLUCOSE GEL    STEP 2: RECHECK blood sugar in 15 MINUTES STEP 3: If your blood sugar is still low at the 15 minute recheck --> then, go back to STEP 1 and treat AGAIN with another 15 grams of carbohydrates.

## 2021-12-14 NOTE — Progress Notes (Signed)
Name: [name] [name]  Age/ Sex: 71 y.o., female   MRN/ DOB: [id number], [birth year]     PCP: [name], MD   Reason for Endocrinology Evaluation: Type 2 Diabetes Mellitus  Initial Endocrine Consultative Visit: 03/20/2018    PATIENT IDENTIFIER: [name] [name] is a 71 y.o. female with a past medical history of T2DM, hypothyroidism, HTN and Hx of breast Ca . The patient has followed with Endocrinology clinic since 03/20/2018 for consultative assistance with management of her diabetes.  DIABETIC HISTORY:  [name] [name] was diagnosed with T2DM many years ago, she is intolerant to Metformin. She was started on Jardiance January, 2020 but due to dizziness this was stopped in February, 2020.She was continued on Victoza and Lantus at the time.  Her hemoglobin A1c has ranged from 6.6% in 2017, peaking at 8.5%  In 2020 Farxiga started 08/2018   Prandial insulin started 02/2020  Hypothyroidism: She was diagnosed with hypothyroidism many years ago. She has been on LT-4 replacement since her diagnosis. She takes it appropriately   SUBJECTIVE:   During the last visit (06/09/2021): A1c 8.2 %.     Today (12/14/2021): [name] [name] is here for a  follow up on diabetes management.   She checks her blood sugars 2-3 x daily . The patient has not had hypoglycemic episodes since the last clinic visit.     She continues to follow-up with oncology for Hx left breast cancer, as of [place] 2023 she was diagnosed with right breast cancer, on chemotherapy, s/p right lumpectomy 08/2021 and radiation   She went to the ED on 09/26/2021 for hip pain-lumbar radiculopathy  Weight stable  Denies nausea or vomiting or diarrhea   She was on Amoxicillin for URI which caused GI side effects   She has not been able to get the [place]:  Victoza 1.8 mg daily Basaglar  60  units daily Farxiga 10 mg daily Fiasp 20 units with each meal  Levothyroxine 150 mcg daily    METER DOWNLOAD SUMMARY:  n/a   DIABETIC COMPLICATIONS: Microvascular complications:  Neuropathy Denies: CKD, retinopathy Last eye exam: Completed 05/26/2021   Macrovascular complications:  Denies: CAD, PVD, CVA      HISTORY:  Past Medical History:  Past Medical History:  Diagnosis Date   Abdominal wall contusion 12/09/2011   Achilles tendon contracture, bilateral 06/03/2016   Anxiety    Arthritis    Blood dyscrasia    low platelet count- sees [name] , [name] ( Note in EPIC from 07/2014) at [facility]) 2017   Left Breast   Breast cancer ([place]) 07/2021   right breast IDC   Cancer ([place])    left breast   Chest wall contusion 12/09/2011   Cirrhosis of liver ([place])    Cough    Diabetes mellitus without complication ([place])    Diabetic neuropathy ([place])    Diarrhea due to drug 01/29/2021   Dyspnea    Gout    Gout    History of radiation therapy 10/22/15 - 12/08/15   left breast 50.4 Gy, boost to 10 Gy   History of radiation therapy    Right breast- 09/22/21-10/30/21- Dr. Gery Pray   Hypertension    Hypothyroidism    Migraine    Multifactorial gait disorder 01/12/2013   Neuromuscular disorder ([place])    diabetic neuropathy   Neuropathy    Personal history of radiation therapy 2017  Left Breast Cancer   Posterior tibial tendon dysfunction (PTTD) of right lower extremity 10/09/2019   Sleep apnea    CPAP nightly   Spleen enlarged    Thyroid disease    Past Surgical History:  Past Surgical History:  Procedure Laterality Date   ANKLE FUSION Right 2022   BREAST BIOPSY Left 08/04/2015   Procedure: LEFT BREAST BIOPSY WITH NEEDLE LOCALIZATION;  Surgeon: [name], MD;  Location: [facility];  Service: General;  Laterality: Left;   BREAST BIOPSY Right 06/23/2021   BREAST DUCTAL SYSTEM EXCISION Left 08/04/2015   Procedure: LEFT EXCISION DUCTAL SYSTEM BREAST;  Surgeon: [name], MD;  Location: [facility];  Service:  General;  Laterality: Left;   BREAST LUMPECTOMY Left 08/2015   BREAST LUMPECTOMY WITH AXILLARY LYMPH NODE BIOPSY Left 09/12/2015   Procedure: RE-EXCISION OF LEFT BREAST LUMPECTOMY WITH LEFT AXILLARY LYMPH NODE BIOPSY;  Surgeon: [name], MD;  Location: [facility];  Service: General;  Laterality: Left;   BREAST LUMPECTOMY WITH RADIOACTIVE SEED LOCALIZATION Right 08/12/2021   Procedure: RIGHT BREAST LUMPECTOMY WITH RADIOACTIVE SEED LOCALIZATION;  Surgeon: Coralie Keens, MD;  Location: [facility];  Service: General;  Laterality: Right;  LMA   CHOLECYSTECTOMY     EYE SURGERY Bilateral 2022   Lashes growing backwards into the eye   JOINT REPLACEMENT     left knee   JOINT REPLACEMENT  2017   right knee   REVERSE SHOULDER ARTHROPLASTY Right 09/07/2018   Procedure: RIGHT REVERSE SHOULDER ARTHROPLASTY;  Surgeon: Meredith Pel, MD;  Location: [facility];  Service: Orthopedics;  Laterality: Right;   TOTAL KNEE ARTHROPLASTY Right 02/28/2015   Procedure: RIGHT TOTAL KNEE ARTHROPLASTY;  Surgeon: [name], MD;  Location: [facility];  Service: Orthopedics;  Laterality: Right;   TUBAL LIGATION     Social History:  reports that she has never smoked. She has never used smokeless tobacco. She reports that she does not drink alcohol and does not use drugs. Family History:  Family History  Problem Relation Age of Onset   Hypothyroidism Mother    Alzheimer's disease Mother    Diabetes Father    Cancer Sister        GYN cancer   Hypothyroidism Sister    Heart disease Brother    Arthritis Son    Diabetes Son    Arthritis Son    Cancer Maternal Grandmother    Heart failure Maternal Grandfather    Bone cancer Paternal Grandmother    Heart failure Paternal Grandmother      HOME MEDICATIONS: Allergies as of 12/14/2021       Reactions   Codeine Swelling   Influenza A (h1n1) Monoval Pf Anaphylaxis   Hydrocodone-acetaminophen Other (See Comments)    Lisinopril Other (See Comments)   Metformin Other (See Comments)   Pregabalin Other (See Comments)   Jardiance [empagliflozin] Other (See Comments)   dehydration        Medication List        Accurate as of December 14, 2021  8:06 AM. If you have any questions, ask your nurse or doctor.          Accu-Chek Aviva Plus test strip Generic drug: glucose blood USE AS INSTRUCTED TO CHECK BLOOD SUGAR 2 TIMES DAILY   acetaminophen 500 MG tablet Commonly known as: TYLENOL Take by mouth.   albuterol 108 (90 Base) MCG/ACT inhaler Commonly known as: VENTOLIN HFA Inhale 1-2 puffs into the lungs every  4 (four) hours as needed for wheezing or shortness of breath.   amoxicillin-clavulanate 875-125 MG tablet Commonly known as: AUGMENTIN Take 1 tablet by mouth 2 (two) times daily.   Basaglar KwikPen 100 UNIT/ML Inject 60 Units into the skin daily.   buPROPion 150 MG 24 hr tablet Commonly known as: WELLBUTRIN XL Take 1 tablet (150 mg total) by mouth every morning.   citalopram 40 MG tablet Commonly known as: CELEXA TAKE 1 TABLET BY MOUTH EVERY DAY   colchicine 0.6 MG tablet Take 2 tablets once today, followed by 1 tablet 1 hour later, and then take 1 tablet once daily from Day 2.   Farxiga 10 MG Tabs tablet Generic drug: dapagliflozin propanediol TAKE 1 TABLET BY MOUTH DAILY BEFORE BREAKFAST.   Fiasp FlexTouch 100 UNIT/ML FlexTouch Pen Generic drug: insulin aspart Inject 18 Units into the skin with breakfast, with lunch, and with evening meal. What changed: how much to take   fluticasone 50 MCG/ACT nasal spray Commonly known as: FLONASE Place 1 spray into both nostrils as needed.   gabapentin 600 MG tablet Commonly known as: NEURONTIN TAKE 1 TABLET IN THE MORNING AND 1 TABLET IN THE AFTERNOON AND 2TABS AT BEDTIME   Insulin Pen Needle 30G X 5 MM Misc 1 Device by Does not apply route in the morning, at noon, in the evening, and at bedtime.   B-D UF III MINI PEN NEEDLES  31G X 5 MM Misc Generic drug: Insulin Pen Needle USE AS DIRECTED WITH INSULIN EVERY MORNING, NOON, EVENING AND AT BEDTIME   ketoconazole 2 % cream Commonly known as: NIZORAL Apply 1 Application topically daily.   levothyroxine 150 MCG tablet Commonly known as: SYNTHROID Take 1 tablet (150 mcg total) by mouth daily.   liraglutide 18 MG/3ML Sopn Commonly known as: VICTOZA Inject 1.8 mg into the skin daily.   losartan 25 MG tablet Commonly known as: COZAAR Take 1 tablet (25 mg total) by mouth daily.   methocarbamol 500 MG tablet Commonly known as: ROBAXIN Take 1 tablet (500 mg total) by mouth every 8 (eight) hours as needed for muscle spasms.   multivitamin with minerals tablet Take 1 tablet by mouth daily.   mycophenolate 500 MG tablet Commonly known as: CELLCEPT Take 1,500 mg by mouth 2 (two) times daily.   promethazine-dextromethorphan 6.25-15 MG/5ML syrup Commonly known as: PROMETHAZINE-DM Take 5 mLs by mouth 4 (four) times daily as needed for cough.   Tart Cherry Advanced Caps Take 1 capsule by mouth daily.   VITAMIN B-12 PO Take by mouth daily.   VITAMIN B-6 PO Take by mouth daily.   VITAMIN C PO Take by mouth daily.   VITAMIN D PO Take by mouth daily.         OBJECTIVE:   Vital Signs: BP 134/70 (BP Location: Left Arm, Patient Position: Sitting, Cuff Size: Large)   Pulse 84   Ht 5' 1"  (1.549 m)   Wt 261 lb (118.4 kg)   SpO2 96%   BMI 49.32 kg/m   Wt Readings from Last 3 Encounters:  12/14/21 261 lb (118.4 kg)  11/19/21 262 lb 1 oz (118.9 kg)  10/26/21 261 lb 3.2 oz (118.5 kg)     Exam: General: Pt appears well and is in NAD  Lungs: Clear with good BS bilat with no rales, rhonchi, or wheezes  Heart: RRR , + systolic murmur  Extremities: No pretibial edema.   Neuro: MS is good with appropriate affect, pt is alert and Ox3  DM foot exam:06/09/2021 The skin of the feet is without sores or ulcerations.but has a left foot callous formation  at the left 1st MTH The pedal pulses are 2+ on right and 2+ on left. The sensation is absent to a screening 5.07, 10 gram monofilament bilaterally     DATA REVIEWED:  Lab Results  Component Value Date   HGBA1C 8.2 (A) 06/09/2021   HGBA1C 7.8 (A) 11/13/2020   HGBA1C 8.8 (A) 05/30/2020     Latest Reference Range & Units 12/14/21 08:42  TSH 0.35 - 5.50 uIU/mL 4.28     Latest Reference Range & Units 11/19/21 08:01  Sodium 135 - 145 mmol/L 141  Potassium 3.5 - 5.1 mmol/L 3.7  Chloride 98 - 111 mmol/L 106  CO2 22 - 32 mmol/L 26  Glucose 70 - 99 mg/dL 221 (H)  BUN 8 - 23 mg/dL 16  Creatinine 0.44 - 1.00 mg/dL 0.83  Calcium 8.9 - 10.3 mg/dL 10.0  Anion gap 5 - 15  9  Alkaline Phosphatase 38 - 126 U/L 102  Albumin 3.5 - 5.0 g/dL 3.8  AST 15 - 41 U/L 28  ALT 0 - 44 U/L 13  Total Protein 6.5 - 8.1 g/dL 6.9  Total Bilirubin 0.3 - 1.2 mg/dL 0.7  GFR, Est Non African American >60 mL/min >60     ASSESSMENT / PLAN / RECOMMENDATIONS:   1) Type 2 Diabetes Mellitus, Sub-Optimally controlled, With neuropathic complications - Most recent A1c of 7.9 %. Goal A1c < 7.0 %.    - A1c trending down but continues to be above goal  - Intolerant to Metformin  - Will re-prescribe Omnipod and dexcom a referral to our CDE for training will be entered.  She was given a pamphlet to register her pump online - Will increase basal insulin as below    MEDICATIONS: Increase  Basaglar 62 units daily Continue Victoza 1.8 mg daily Continue  Farxiga 10 mg daily  Continue  Fiasp 20 units with each meal   EDUCATION / INSTRUCTIONS: BG monitoring instructions: Patient is instructed to check her blood sugars 3 times a day, before meals Call [place] [facility] if: BG persistently < 70  I reviewed the Rule of 15 for the treatment of hypoglycemia in detail with the patient. Literature supplied.      2)Hypothyroidism :    - Pt is clinically euthyroid  - TSh within normal range      Medications  Continue  levothyroxine 150 mcg daily    F/U in 6 months    Signed electronically by: Mack Guise, MD  [facility]  [facility] [address] [place], [place] [address] Phone: [phone] FAX: [phone]   CC: [name], MD [address] [place] [address] Phone: [phone]  Fax: [phone]  Return to Endocrinology clinic as below: Future Appointments  Date Time Provider [place]  12/14/2021  8:30 AM Azucena Dart, Melanie Crazier, MD [facility] None  12/17/2021  9:45 AM Gery Pray, MD [facility] None  12/17/2021 11:15 AM [facility] None  12/17/2021 12:00 PM [place] [facility] None  01/14/2022 11:15 AM [facility] LAB [facility] None  01/14/2022 12:00 PM [facility] [place] [facility] None  02/11/2022 11:15 AM [facility] LAB [facility] None  02/11/2022 11:45 AM [name], MD [facility] None  02/11/2022 12:00 PM [place] [facility] [place] None  02/12/2022  9:00 AM [facility] NURSE [facility]  02/15/2022  1:20 PM [name], MD [facility] RPC  06/23/2022  8:30 AM [name], MD [facility] None

## 2021-12-16 ENCOUNTER — Other Ambulatory Visit: Payer: Self-pay | Admitting: *Deleted

## 2021-12-16 DIAGNOSIS — Z170 Estrogen receptor positive status [ER+]: Secondary | ICD-10-CM

## 2021-12-16 NOTE — Progress Notes (Signed)
Radiation Oncology         [phone]) [phone] ________________________________  Name: [name] [name] MRN: [id number]  Date: 12/17/2021  DOB: 1950/09/25  Follow-Up Visit Note  CC: [name], MD  [name], MD  No diagnosis found.  Diagnosis:  S/p lumpectomy: Stage IA Right Breast UOQ, Invasive Ductal Carcinoma, ER+ / PR+ / Her2-, Grade 2   History of Stage IA (cT1c, cN0, cM0) Left breast Invasive Ductal Carcinoma diagnosed in 2017; s/p lumpectomy, radiation, and antiestrogens (anastrozle)    Interval Since Last Radiation: 1 month and 18 days  Intent: Curative  Radiation Treatment Dates: 09/22/2021 through 10/30/2021 Site Technique Total Dose (Gy) Dose per Fx (Gy) Completed Fx Beam Energies  Breast, Right: Breast_R 3D 45/45 1.8 25/25 15X, 10XFFF  Breast, Right: Breast_R_Bst 3D 6/6 2 3/3 10X, 15X   Adjuvant radiation 10/22/15 - 12/08/15              1) Left breast: 50.4 Gy in 28 fractions              2) Left breast boost: 10 Gy in 5 fractions  Narrative:  The patient returns today for routine follow-up. The patient tolerated radiation therapy relatively well. During her final weekly treatment check on 10/29/21, the patient reported right axillary pain, fatigue, skin irritation, and mild swelling and tenderness. Physical exam performed on that same date revealed: an erythematous rash consistent with a yeast infection to the lateral left breast inframammary fold, hyperpigmentation changes and diffuse erythema to the right breast area, and minimal skin breakdown in the inferior aspect of the right breast area. She stated that her husband helped her place antibiotic ointment in the areas of skin breakdown. She was also given antifungal powder to place along the inframammary fold of the left breast.   Since completing XRT, the patient followed up with Dr. Chryl Heck on 11/19/21. Following discussion, the patient agreed to continue Faslodex as recommended for 5 years. She is tolerating faslodex  well without significant side effects. The patient also reported being treated for pneumonia recently, and endorsed a persistent severe cough despite finishing her abx the prior week. Dr. Chryl Heck ordered a repeat chest x-ray which showed no active cardiopulmonary disease, and advised her to continue taking Tessalon Perles for cough as needed.        Other imaging performed in the interval includes a CT of the pelvis without contrast on 09/2621 (for evaluation of left hip pain in the ED) which showed no acute abnormality of the bony pelvis or the left hip to account for the patient's symptoms.                         ***  Allergies:  is allergic to codeine, influenza a (h1n1) monoval pf, hydrocodone-acetaminophen, lisinopril, metformin, pregabalin, and jardiance [empagliflozin].  Meds: Current Outpatient Medications  Medication Sig Dispense Refill   ACCU-CHEK AVIVA PLUS test strip USE AS INSTRUCTED TO CHECK BLOOD SUGAR 2 TIMES DAILY 100 strip 5   acetaminophen (TYLENOL) 500 MG tablet Take by mouth.     albuterol (VENTOLIN HFA) 108 (90 Base) MCG/ACT inhaler Inhale 1-2 puffs into the lungs every 4 (four) hours as needed for wheezing or shortness of breath. 18 g 2   amoxicillin-clavulanate (AUGMENTIN) 875-125 MG tablet Take 1 tablet by mouth 2 (two) times daily. (Patient not taking: Reported on 12/14/2021) 14 tablet 0   Ascorbic Acid (VITAMIN C PO) Take by mouth daily.  buPROPion (WELLBUTRIN XL) 150 MG 24 hr tablet Take 1 tablet (150 mg total) by mouth every morning. 90 tablet 1   citalopram (CELEXA) 40 MG tablet TAKE 1 TABLET BY MOUTH EVERY DAY 90 tablet 1   colchicine 0.6 MG tablet Take 2 tablets once today, followed by 1 tablet 1 hour later, and then take 1 tablet once daily from Day 2. 20 tablet 0   Continuous Blood Gluc Sensor (DEXCOM G6 SENSOR) MISC 1 Device by Does not apply route as directed. 9 each 3   Continuous Blood Gluc Transmit (DEXCOM G6 TRANSMITTER) MISC 1 Device by Does not apply  route as directed. 1 each 3   Cyanocobalamin (VITAMIN B-12 PO) Take by mouth daily.     FARXIGA 10 MG TABS tablet TAKE 1 TABLET BY MOUTH DAILY BEFORE BREAKFAST. 90 tablet 3   fluticasone (FLONASE) 50 MCG/ACT nasal spray Place 1 spray into both nostrils as needed.      gabapentin (NEURONTIN) 600 MG tablet TAKE 1 TABLET IN THE MORNING AND 1 TABLET IN THE AFTERNOON AND 2TABS AT BEDTIME     insulin aspart (FIASP FLEXTOUCH) 100 UNIT/ML FlexTouch Pen Max daily 60 units 60 mL 3   Insulin Disposable Pump (OMNIPOD 5 G6 INTRO, GEN 5,) KIT 1 Device by Does not apply route every other day. 1 kit 0   Insulin Disposable Pump (OMNIPOD 5 G6 POD, GEN 5,) MISC 1 Device by Does not apply route every other day. 9 each 3   Insulin Glargine (BASAGLAR KWIKPEN) 100 UNIT/ML Inject 62 Units into the skin daily. 60 mL 3   Insulin Pen Needle (B-D UF III MINI PEN NEEDLES) 31G X 5 MM MISC USE AS DIRECTED WITH INSULIN EVERY MORNING, NOON, EVENING AND AT BEDTIME 400 each 3   Insulin Pen Needle 30G X 5 MM MISC 1 Device by Does not apply route in the morning, at noon, in the evening, and at bedtime. 400 each 3   ketoconazole (NIZORAL) 2 % cream Apply 1 Application topically daily. 60 g 0   levothyroxine (SYNTHROID) 150 MCG tablet Take 1 tablet (150 mcg total) by mouth daily. 90 tablet 3   liraglutide (VICTOZA) 18 MG/3ML SOPN Inject 1.8 mg into the skin daily. 27 mL 3   losartan (COZAAR) 25 MG tablet Take 1 tablet (25 mg total) by mouth daily. 90 tablet 1   methocarbamol (ROBAXIN) 500 MG tablet Take 1 tablet (500 mg total) by mouth every 8 (eight) hours as needed for muscle spasms. 30 tablet 0   Misc Natural Products (TART CHERRY ADVANCED) CAPS Take 1 capsule by mouth daily.     Multiple Vitamins-Minerals (MULTIVITAMIN WITH MINERALS) tablet Take 1 tablet by mouth daily.     mycophenolate (CELLCEPT) 500 MG tablet Take 1,500 mg by mouth 2 (two) times daily.     promethazine-dextromethorphan (PROMETHAZINE-DM) 6.25-15 MG/5ML syrup Take  5 mLs by mouth 4 (four) times daily as needed for cough. 473 mL 0   Pyridoxine HCl (VITAMIN B-6 PO) Take by mouth daily.     VITAMIN D PO Take by mouth daily.     No current facility-administered medications for this encounter.    Physical Findings: The patient is in no acute distress. Patient is alert and oriented.  vitals were not taken for this visit. .  No significant changes. Lungs are clear to auscultation bilaterally. Heart has regular rate and rhythm. No palpable cervical, supraclavicular, or axillary adenopathy. Abdomen soft, non-tender, normal bowel sounds.  Left Breast: no palpable  mass, nipple discharge or bleeding. Right breast: ***  Lab Findings: Lab Results  Component Value Date   WBC 2.6 (L) 11/19/2021   HGB 12.8 11/19/2021   HCT 41.5 11/19/2021   MCV 91.6 11/19/2021   PLT 69 (L) 11/19/2021    Radiographic Findings: DG Chest 2 View  Result Date: 11/19/2021 CLINICAL DATA:  Left breast cancer.  Follow-up pneumonia. EXAM: CHEST - 2 VIEW COMPARISON:  10/26/2021 chest radiograph. FINDINGS: Partially visualized right total shoulder arthroplasty. Stable cardiomediastinal silhouette with normal heart size. No pneumothorax. No pleural effusion. Lungs appear clear, with no acute consolidative airspace disease and no pulmonary edema. Surgical clips overlie the outer left breast. IMPRESSION: No active cardiopulmonary disease. Electronically Signed   By: Ilona Sorrel M.D.   On: 11/19/2021 10:23    Impression:  S/p lumpectomy: Stage IA Right Breast UOQ, Invasive Ductal Carcinoma, ER+ / PR+ / Her2-, Grade 2   History of Stage IA (cT1c, cN0, cM0) Left breast Invasive Ductal Carcinoma diagnosed in 2017; s/p lumpectomy, radiation, and antiestrogens (anastrozle)    The patient is recovering from the effects of radiation.  ***  Plan:  ***   *** minutes of total time was spent for this patient encounter, including preparation, face-to-face counseling with the patient and  coordination of care, physical exam, and documentation of the encounter. ____________________________________  Blair Promise, PhD, MD  This document serves as a record of services personally performed by Gery Pray, MD. It was created on his behalf by Roney Mans, a trained medical scribe. The creation of this record is based on the scribe's personal observations and the provider's statements to them. This document has been checked and approved by the attending provider.

## 2021-12-17 ENCOUNTER — Inpatient Hospital Stay: Payer: Medicare Other

## 2021-12-17 ENCOUNTER — Ambulatory Visit
Admission: RE | Admit: 2021-12-17 | Discharge: 2021-12-17 | Disposition: A | Discharge status: Home / Self Care | Payer: Medicare Other | Source: Ambulatory Visit | Attending: Radiation Oncology | Admitting: Radiation Oncology

## 2021-12-17 ENCOUNTER — Other Ambulatory Visit: Payer: Self-pay

## 2021-12-17 ENCOUNTER — Encounter: Payer: Self-pay | Admitting: Radiation Oncology

## 2021-12-17 VITALS — BP 125/63 | HR 87 | Temp 97.8°F | Resp 18 | Ht 61.0 in | Wt 261.1 lb

## 2021-12-17 VITALS — BP 132/62 | HR 81 | Temp 98.2°F | Resp 20

## 2021-12-17 DIAGNOSIS — Z5111 Encounter for antineoplastic chemotherapy: Secondary | ICD-10-CM | POA: Diagnosis not present

## 2021-12-17 DIAGNOSIS — Z170 Estrogen receptor positive status [ER+]: Secondary | ICD-10-CM | POA: Diagnosis not present

## 2021-12-17 DIAGNOSIS — C50412 Malignant neoplasm of upper-outer quadrant of left female breast: Secondary | ICD-10-CM | POA: Diagnosis not present

## 2021-12-17 LAB — CBC WITH DIFFERENTIAL (CANCER CENTER ONLY)
Abs Immature Granulocytes: 0 10*3/uL (ref 0.00–0.07)
Basophils Absolute: 0 10*3/uL (ref 0.0–0.1)
Basophils Relative: 1 %
Eosinophils Absolute: 0.1 10*3/uL (ref 0.0–0.5)
Eosinophils Relative: 3 %
HCT: 38.5 % (ref 36.0–46.0)
Hemoglobin: 12.4 g/dL (ref 12.0–15.0)
Immature Granulocytes: 0 %
Lymphocytes Relative: 14 %
Lymphs Abs: 0.6 10*3/uL — ABNORMAL LOW (ref 0.7–4.0)
MCH: 28.8 pg (ref 26.0–34.0)
MCHC: 32.2 g/dL (ref 30.0–36.0)
MCV: 89.3 fL (ref 80.0–100.0)
Monocytes Absolute: 0.3 10*3/uL (ref 0.1–1.0)
Monocytes Relative: 8 %
Neutro Abs: 3.1 10*3/uL (ref 1.7–7.7)
Neutrophils Relative %: 74 %
Platelet Count: 78 10*3/uL — ABNORMAL LOW (ref 150–400)
RBC: 4.31 MIL/uL (ref 3.87–5.11)
RDW: 14.3 % (ref 11.5–15.5)
WBC Count: 4.2 10*3/uL (ref 4.0–10.5)
nRBC: 0 % (ref 0.0–0.2)

## 2021-12-17 LAB — CMP (CANCER CENTER ONLY)
ALT: 14 U/L (ref 0–44)
AST: 31 U/L (ref 15–41)
Albumin: 4 g/dL (ref 3.5–5.0)
Alkaline Phosphatase: 106 U/L (ref 38–126)
Anion gap: 9 (ref 5–15)
BUN: 14 mg/dL (ref 8–23)
CO2: 26 mmol/L (ref 22–32)
Calcium: 10.5 mg/dL — ABNORMAL HIGH (ref 8.9–10.3)
Chloride: 103 mmol/L (ref 98–111)
Creatinine: 0.83 mg/dL (ref 0.44–1.00)
GFR, Estimated: >60 mL/min (ref >60)
Glucose, Bld: 229 mg/dL — ABNORMAL HIGH (ref 70–99)
Potassium: 3.9 mmol/L (ref 3.5–5.1)
Sodium: 138 mmol/L (ref 135–145)
Total Bilirubin: 0.7 mg/dL (ref 0.3–1.2)
Total Protein: 7.4 g/dL (ref 6.5–8.1)

## 2021-12-17 MED ORDER — FULVESTRANT 250 MG/5ML IM SOSY
500.0000 mg | PREFILLED_SYRINGE | Freq: Once | INTRAMUSCULAR | Status: AC
  Administered 2021-12-17: 500 mg via INTRAMUSCULAR
  Filled 2021-12-17: qty 10

## 2021-12-17 NOTE — Progress Notes (Signed)
[name] [name] is here today for follow up post radiation to the breast.   Breast Side:Right   They completed their radiation on: 10/30/21  Does the patient complain of any of the following: Post radiation skin issues: No  Breast Tenderness: Yes, under right breast.  Breast Swelling: No Lymphadema: No Range of Motion limitations: Yes Fatigue post radiation: Yes Appetite good/fair/poor: Good   Additional comments if applicable:   BP 027/25 (BP Location: Right Arm, Patient Position: Sitting)   Pulse 87   Temp 97.8 F (36.6 C) (Temporal)   Resp 18   Ht 5' 1"  (1.549 m)   Wt 261 lb 2 oz (118.4 kg)   SpO2 97%   BMI 49.34 kg/m

## 2021-12-18 ENCOUNTER — Telehealth: Payer: Self-pay

## 2021-12-18 NOTE — Telephone Encounter (Signed)
Patient will callback when she is home to schedule labs fasting labs for the Omnipod and Dexcom.

## 2021-12-31 DIAGNOSIS — Z87828 Personal history of other (healed) physical injury and trauma: Secondary | ICD-10-CM | POA: Diagnosis not present

## 2021-12-31 DIAGNOSIS — J3489 Other specified disorders of nose and nasal sinuses: Secondary | ICD-10-CM | POA: Diagnosis not present

## 2021-12-31 DIAGNOSIS — J343 Hypertrophy of nasal turbinates: Secondary | ICD-10-CM | POA: Diagnosis not present

## 2021-12-31 DIAGNOSIS — J310 Chronic rhinitis: Secondary | ICD-10-CM | POA: Diagnosis not present

## 2021-12-31 DIAGNOSIS — Z79899 Other long term (current) drug therapy: Secondary | ICD-10-CM | POA: Diagnosis not present

## 2021-12-31 DIAGNOSIS — L121 Cicatricial pemphigoid: Secondary | ICD-10-CM | POA: Diagnosis not present

## 2021-12-31 DIAGNOSIS — L128 Other pemphigoid: Secondary | ICD-10-CM | POA: Diagnosis not present

## 2021-12-31 DIAGNOSIS — R040 Epistaxis: Secondary | ICD-10-CM | POA: Diagnosis not present

## 2021-12-31 DIAGNOSIS — J342 Deviated nasal septum: Secondary | ICD-10-CM | POA: Diagnosis not present

## 2022-01-05 ENCOUNTER — Other Ambulatory Visit (INDEPENDENT_AMBULATORY_CARE_PROVIDER_SITE_OTHER): Payer: Medicare Other

## 2022-01-05 DIAGNOSIS — E1165 Type 2 diabetes mellitus with hyperglycemia: Secondary | ICD-10-CM | POA: Diagnosis not present

## 2022-01-05 DIAGNOSIS — Z794 Long term (current) use of insulin: Secondary | ICD-10-CM

## 2022-01-05 LAB — GLUCOSE, RANDOM: Glucose, Bld: 163 mg/dL — ABNORMAL HIGH (ref 70–99)

## 2022-01-06 LAB — C-PEPTIDE: C-Peptide: 3.45 ng/mL (ref 0.80–3.85)

## 2022-01-13 ENCOUNTER — Other Ambulatory Visit: Payer: Self-pay

## 2022-01-13 DIAGNOSIS — C50411 Malignant neoplasm of upper-outer quadrant of right female breast: Secondary | ICD-10-CM

## 2022-01-14 ENCOUNTER — Inpatient Hospital Stay: Payer: Medicare Other | Attending: Hematology and Oncology

## 2022-01-14 ENCOUNTER — Other Ambulatory Visit: Payer: Self-pay

## 2022-01-14 ENCOUNTER — Inpatient Hospital Stay: Payer: Medicare Other

## 2022-01-14 VITALS — BP 149/61 | HR 81 | Temp 98.0°F | Resp 20

## 2022-01-14 DIAGNOSIS — C50412 Malignant neoplasm of upper-outer quadrant of left female breast: Secondary | ICD-10-CM | POA: Insufficient documentation

## 2022-01-14 DIAGNOSIS — Z170 Estrogen receptor positive status [ER+]: Secondary | ICD-10-CM

## 2022-01-14 DIAGNOSIS — Z5111 Encounter for antineoplastic chemotherapy: Secondary | ICD-10-CM | POA: Insufficient documentation

## 2022-01-14 LAB — CMP (CANCER CENTER ONLY)
ALT: 12 U/L (ref 0–44)
AST: 27 U/L (ref 15–41)
Albumin: 3.9 g/dL (ref 3.5–5.0)
Alkaline Phosphatase: 109 U/L (ref 38–126)
Anion gap: 8 (ref 5–15)
BUN: 14 mg/dL (ref 8–23)
CO2: 25 mmol/L (ref 22–32)
Calcium: 10.5 mg/dL — ABNORMAL HIGH (ref 8.9–10.3)
Chloride: 105 mmol/L (ref 98–111)
Creatinine: 0.75 mg/dL (ref 0.44–1.00)
GFR, Estimated: >60 mL/min (ref >60)
Glucose, Bld: 221 mg/dL — ABNORMAL HIGH (ref 70–99)
Potassium: 3.7 mmol/L (ref 3.5–5.1)
Sodium: 138 mmol/L (ref 135–145)
Total Bilirubin: 0.8 mg/dL (ref 0.3–1.2)
Total Protein: 6.8 g/dL (ref 6.5–8.1)

## 2022-01-14 LAB — CBC WITH DIFFERENTIAL (CANCER CENTER ONLY)
Abs Immature Granulocytes: 0.01 10*3/uL (ref 0.00–0.07)
Basophils Absolute: 0 10*3/uL (ref 0.0–0.1)
Basophils Relative: 1 %
Eosinophils Absolute: 0.1 10*3/uL (ref 0.0–0.5)
Eosinophils Relative: 4 %
HCT: 40.5 % (ref 36.0–46.0)
Hemoglobin: 12.9 g/dL (ref 12.0–15.0)
Immature Granulocytes: 0 %
Lymphocytes Relative: 18 %
Lymphs Abs: 0.5 10*3/uL — ABNORMAL LOW (ref 0.7–4.0)
MCH: 28.5 pg (ref 26.0–34.0)
MCHC: 31.9 g/dL (ref 30.0–36.0)
MCV: 89.4 fL (ref 80.0–100.0)
Monocytes Absolute: 0.2 10*3/uL (ref 0.1–1.0)
Monocytes Relative: 7 %
Neutro Abs: 1.8 10*3/uL (ref 1.7–7.7)
Neutrophils Relative %: 70 %
Platelet Count: 63 10*3/uL — ABNORMAL LOW (ref 150–400)
RBC: 4.53 MIL/uL (ref 3.87–5.11)
RDW: 15.2 % (ref 11.5–15.5)
WBC Count: 2.6 10*3/uL — ABNORMAL LOW (ref 4.0–10.5)
nRBC: 0 % (ref 0.0–0.2)

## 2022-01-14 MED ORDER — FULVESTRANT 250 MG/5ML IM SOSY
500.0000 mg | PREFILLED_SYRINGE | Freq: Once | INTRAMUSCULAR | Status: AC
  Administered 2022-01-14: 500 mg via INTRAMUSCULAR
  Filled 2022-01-14: qty 10

## 2022-01-15 DIAGNOSIS — H35373 Puckering of macula, bilateral: Secondary | ICD-10-CM | POA: Diagnosis not present

## 2022-01-15 DIAGNOSIS — E119 Type 2 diabetes mellitus without complications: Secondary | ICD-10-CM | POA: Diagnosis not present

## 2022-01-15 DIAGNOSIS — H02056 Trichiasis without entropian left eye, unspecified eyelid: Secondary | ICD-10-CM | POA: Diagnosis not present

## 2022-01-15 DIAGNOSIS — H02053 Trichiasis without entropian right eye, unspecified eyelid: Secondary | ICD-10-CM | POA: Diagnosis not present

## 2022-01-15 DIAGNOSIS — Z79899 Other long term (current) drug therapy: Secondary | ICD-10-CM | POA: Diagnosis not present

## 2022-01-15 DIAGNOSIS — L121 Cicatricial pemphigoid: Secondary | ICD-10-CM | POA: Diagnosis not present

## 2022-01-15 DIAGNOSIS — Z794 Long term (current) use of insulin: Secondary | ICD-10-CM | POA: Diagnosis not present

## 2022-01-15 DIAGNOSIS — Z961 Presence of intraocular lens: Secondary | ICD-10-CM | POA: Diagnosis not present

## 2022-01-15 LAB — HM DIABETES EYE EXAM

## 2022-01-20 ENCOUNTER — Telehealth: Payer: Self-pay | Admitting: Internal Medicine

## 2022-01-20 DIAGNOSIS — E1143 Type 2 diabetes mellitus with diabetic autonomic (poly)neuropathy: Secondary | ICD-10-CM | POA: Diagnosis not present

## 2022-01-20 DIAGNOSIS — K7581 Nonalcoholic steatohepatitis (NASH): Secondary | ICD-10-CM | POA: Diagnosis not present

## 2022-01-20 DIAGNOSIS — F3341 Major depressive disorder, recurrent, in partial remission: Secondary | ICD-10-CM | POA: Diagnosis not present

## 2022-01-20 DIAGNOSIS — E1165 Type 2 diabetes mellitus with hyperglycemia: Secondary | ICD-10-CM | POA: Diagnosis not present

## 2022-01-20 DIAGNOSIS — E039 Hypothyroidism, unspecified: Secondary | ICD-10-CM | POA: Diagnosis not present

## 2022-01-20 DIAGNOSIS — C50912 Malignant neoplasm of unspecified site of left female breast: Secondary | ICD-10-CM | POA: Diagnosis not present

## 2022-01-20 NOTE — Telephone Encounter (Signed)
Spoke to [employer], we referred patient to [facility] but we do not have any recent labs that we have done to send to them.

## 2022-01-20 NOTE — Telephone Encounter (Signed)
Amber called from [facility] asked for most recent labs to be faxed over to their office at [phone] and any questions contact Amber at [phone].

## 2022-01-20 NOTE — Telephone Encounter (Signed)
Spoke to patient

## 2022-01-20 NOTE — Telephone Encounter (Signed)
Patient left voicemail returning nurse call.

## 2022-01-26 ENCOUNTER — Other Ambulatory Visit: Payer: Self-pay

## 2022-01-26 MED ORDER — OZEMPIC (1 MG/DOSE) 4 MG/3ML ~~LOC~~ SOPN
1.0000 mg | PEN_INJECTOR | SUBCUTANEOUS | 5 refills | Status: DC
  Filled 2022-01-26: qty 3, 28d supply, fill #0
  Filled 2022-02-19: qty 3, 28d supply, fill #1
  Filled 2022-03-17: qty 3, 28d supply, fill #2
  Filled 2022-04-27: qty 3, 28d supply, fill #3
  Filled 2022-05-21: qty 3, 28d supply, fill #4
  Filled 2022-06-16: qty 3, 28d supply, fill #5

## 2022-01-27 ENCOUNTER — Other Ambulatory Visit: Payer: Self-pay

## 2022-01-27 MED ORDER — OMNIPOD 5 DEXG7G6 PODS GEN 5 MISC: 1.0000 | 3 refills | Status: DC

## 2022-01-29 ENCOUNTER — Telehealth: Payer: Self-pay | Admitting: Internal Medicine

## 2022-01-29 NOTE — Telephone Encounter (Signed)
Pt called stating she has the crud again & is wanting to see if she can get something & speak to nurse??

## 2022-01-29 NOTE — Telephone Encounter (Signed)
Spoke with patient, she is scheduled for 02/03/22

## 2022-02-03 ENCOUNTER — Other Ambulatory Visit: Payer: Self-pay

## 2022-02-03 ENCOUNTER — Ambulatory Visit (INDEPENDENT_AMBULATORY_CARE_PROVIDER_SITE_OTHER): Payer: Medicare Other | Admitting: Internal Medicine

## 2022-02-03 ENCOUNTER — Encounter: Payer: Self-pay | Admitting: Internal Medicine

## 2022-02-03 ENCOUNTER — Telehealth: Payer: Self-pay

## 2022-02-03 VITALS — BP 138/72 | HR 93 | Ht 61.0 in | Wt 261.0 lb

## 2022-02-03 DIAGNOSIS — Z887 Allergy status to serum and vaccine status: Secondary | ICD-10-CM | POA: Diagnosis not present

## 2022-02-03 DIAGNOSIS — Z170 Estrogen receptor positive status [ER+]: Secondary | ICD-10-CM

## 2022-02-03 DIAGNOSIS — Z794 Long term (current) use of insulin: Secondary | ICD-10-CM

## 2022-02-03 DIAGNOSIS — I10 Essential (primary) hypertension: Secondary | ICD-10-CM

## 2022-02-03 DIAGNOSIS — F3340 Major depressive disorder, recurrent, in remission, unspecified: Secondary | ICD-10-CM

## 2022-02-03 DIAGNOSIS — R296 Repeated falls: Secondary | ICD-10-CM

## 2022-02-03 DIAGNOSIS — J0191 Acute recurrent sinusitis, unspecified: Secondary | ICD-10-CM

## 2022-02-03 DIAGNOSIS — R2689 Other abnormalities of gait and mobility: Secondary | ICD-10-CM

## 2022-02-03 DIAGNOSIS — L121 Cicatricial pemphigoid: Secondary | ICD-10-CM

## 2022-02-03 DIAGNOSIS — Z2821 Immunization not carried out because of patient refusal: Secondary | ICD-10-CM

## 2022-02-03 DIAGNOSIS — C50412 Malignant neoplasm of upper-outer quadrant of left female breast: Secondary | ICD-10-CM

## 2022-02-03 DIAGNOSIS — J209 Acute bronchitis, unspecified: Secondary | ICD-10-CM | POA: Diagnosis not present

## 2022-02-03 DIAGNOSIS — E1142 Type 2 diabetes mellitus with diabetic polyneuropathy: Secondary | ICD-10-CM | POA: Diagnosis not present

## 2022-02-03 MED ORDER — AZITHROMYCIN 250 MG PO TABS: ORAL_TABLET | ORAL | 0 refills | Status: AC

## 2022-02-03 MED ORDER — OMNIPOD 5 DEXG7G6 PODS GEN 5 MISC: 1.0000 | 3 refills | Status: DC

## 2022-02-03 MED ORDER — PROMETHAZINE-DM 6.25-15 MG/5ML PO SYRP: 5.0000 mL | ORAL_SOLUTION | Freq: Four times a day (QID) | ORAL | 0 refills | Status: DC | PRN

## 2022-02-03 NOTE — Progress Notes (Addendum)
Established Patient Office Visit  Subjective:  Patient ID: [name] [name], female    DOB: 1950/02/05  Age: 72 y.o. MRN: [id number]  CC:  Chief Complaint  Patient presents with   Cough    Patient has a productive cough for two weeks, coughing up mucus and blood. Patient wants a rx for a scooter    HPI [name] [name] is a 72 y.o. female with past medical history of HTN, type II DM with neuropathy, OSA, hypothyroidism, OA of multiple joints, gout, MDD, breast cancer s/p lumpectomy and morbid obesity who presents for f/u of her chronic medical conditions.  She complains of persistent cough with clear sputum and dyspnea since 01/21/22.  She has been using albuterol inhaler as needed for dyspnea with mild relief.  She denies any fever or chills.  She denies hemoptysis.  She has been taking Promethazine DM syrup as needed for cough.  Of note, she has history of chronic sinusitis due to DNS, has had ENT evaluation.  She uses Flonase infrequently for nasal congestion.   HTN: Her BP was well controlled in the office today.  She takes Losartan.  She denies any headache, dizziness, chest pain, or palpitations currently.   Type II DM with HLD: Followed by endocrinology - Dr Roanna Raider.  She is on Hospital doctor and ISS.  She also takes Ozempic and [place].  Her last HbA1C was 7.9.  She was planned to get a insulin pump, but has not been able to get it due to insurance coverage concern.  She denies any polyuria or polydipsia currently.  She takes gabapentin and a natural supplement for neuropathy.  She also has chronic numbness of bilateral feet.  She used to follow-up with neurology, but has not visited them for last few months.  She has her recurrent falls due to balance problems.  She has severe knee and ankle pain due to OA.  She has chronic low back pain as well.  She has limited mobility, that impairs her ability to participate in MRADLs, such as bathing, dressing and toileting.  She also has history of OA of hands,  which limits her ability to operate her self-propel a manual wheelchair and also handles of a scooter.  She currently uses cane, but is motivated to use electric wheelchair to avoid falls and to decrease her dependence for mobility.  She has history of left breast cancer, s/p lumpectomy.  She was on anastrozole for ER/PR positive breast cancer and follows up with Oncology. She was recently found to have recurrence and completed radiation.    Past Medical History:  Diagnosis Date   Abdominal wall contusion 12/09/2011   Achilles tendon contracture, bilateral 06/03/2016   Anxiety    Arthritis    Blood dyscrasia    low platelet count- sees [name] , Dr. Nelva Nay ( Note in EPIC from 07/2014) at [facility]   Breast cancer [facility]) 2017   Left Breast   Breast cancer (HCC) 07/2021   right breast IDC   Cancer (HCC)    left breast   Chest wall contusion 12/09/2011   Cirrhosis of liver (HCC)    Cough    Diabetes mellitus without complication (HCC)    Diabetic neuropathy (HCC)    Diarrhea due to drug 01/29/2021   Dyspnea    Gout    Gout    History of radiation therapy 10/22/15 - 12/08/15   left breast 50.4 Gy, boost to 10 Gy   History of radiation therapy  Right breast- 09/22/21-10/30/21- Dr. 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   Neuromuscular disorder [facility])    diabetic neuropathy   Neuropathy    Personal history of radiation therapy 2017   Left Breast Cancer   Posterior tibial tendon dysfunction (PTTD) of right lower extremity 10/09/2019   Sleep apnea    CPAP nightly   Spleen enlarged    Thyroid disease     Past Surgical History:  Procedure Laterality Date   ANKLE FUSION Right 2022   BREAST BIOPSY Left 08/04/2015   Procedure: LEFT BREAST BIOPSY WITH NEEDLE LOCALIZATION;  Surgeon: Darnell Level, MD;  Location: [facility];  Service: General;  Laterality: Left;   BREAST BIOPSY Right  06/23/2021   BREAST DUCTAL SYSTEM EXCISION Left 08/04/2015   Procedure: LEFT EXCISION DUCTAL SYSTEM BREAST;  Surgeon: Darnell Level, MD;  Location: [facility];  Service: General;  Laterality: Left;   BREAST LUMPECTOMY Left 08/2015   BREAST LUMPECTOMY WITH AXILLARY LYMPH NODE BIOPSY Left 09/12/2015   Procedure: RE-EXCISION OF LEFT BREAST LUMPECTOMY WITH LEFT AXILLARY LYMPH NODE BIOPSY;  Surgeon: Darnell Level, MD;  Location: [facility];  Service: General;  Laterality: Left;   BREAST LUMPECTOMY WITH RADIOACTIVE SEED LOCALIZATION Right 08/12/2021   Procedure: RIGHT BREAST LUMPECTOMY WITH RADIOACTIVE SEED LOCALIZATION;  Surgeon: Abigail Miyamoto, MD;  Location: [facility];  Service: General;  Laterality: Right;  LMA   CHOLECYSTECTOMY     EYE SURGERY Bilateral 2022   Lashes growing backwards into the eye   JOINT REPLACEMENT     left knee   JOINT REPLACEMENT  2017   right knee   REVERSE SHOULDER ARTHROPLASTY Right 09/07/2018   Procedure: RIGHT REVERSE SHOULDER ARTHROPLASTY;  Surgeon: Cammy Copa, MD;  Location: [facility];  Service: Orthopedics;  Laterality: Right;   TOTAL KNEE ARTHROPLASTY Right 02/28/2015   Procedure: RIGHT TOTAL KNEE ARTHROPLASTY;  Surgeon: Kathryne Hitch, MD;  Location: [facility];  Service: Orthopedics;  Laterality: Right;   TUBAL LIGATION      Family History  Problem Relation Age of Onset   Hypothyroidism Mother    Alzheimer's disease Mother    Diabetes Father    Cancer Sister        GYN cancer   Hypothyroidism Sister    Heart disease Brother    Arthritis Son    Diabetes Son    Arthritis Son    Cancer Maternal Grandmother    Heart failure Maternal Grandfather    Bone cancer Paternal Grandmother    Heart failure Paternal Grandmother     Social History   Socioeconomic History   Marital status: Married    Spouse name: edward   Number of children: 3   Years of education: 12th   Highest education level: Not on  file  Occupational History   Occupation: Retired    Comment: Dance movement psychotherapist  Tobacco Use   Smoking status: Never   Smokeless tobacco: Never  Building services engineer Use: Never used  Substance and Sexual Activity   Alcohol use: No   Drug use: No   Sexual activity: Yes    Birth control/protection: Surgical    Comment: BTL  Other Topics Concern   Not on file  Social History Narrative   Not on file   Social Determinants of Health   Financial Resource Strain: Low Risk  (02/11/2021)   Overall Financial  Resource Strain (CARDIA)    Difficulty of Paying Living Expenses: Not very hard  Food Insecurity: No Food Insecurity (08/27/2021)   Hunger Vital Sign    Worried About Running Out of Food in the Last Year: Never true    Ran Out of Food in the Last Year: Never true  Transportation Needs: No Transportation Needs (08/27/2021)   PRAPARE - Administrator, Civil Service (Medical): No    Lack of Transportation (Non-Medical): No  Physical Activity: Insufficiently Active (02/11/2021)   Exercise Vital Sign    Days of Exercise per Week: 3 days    Minutes of Exercise per Session: 30 min  Stress: No Stress Concern Present (02/11/2021)   [employer] of Occupational Health - Occupational Stress Questionnaire    Feeling of Stress : Only a little  Social Connections: Moderately Integrated (02/11/2021)   Social Connection and Isolation Panel [NHANES]    Frequency of Communication with Friends and Family: Three times a week    Frequency of Social Gatherings with Friends and Family: Never    Attends Religious Services: More than 4 times per year    Active Member of [employer] or Organizations: No    Attends Banker Meetings: Never    Marital Status: Married  Catering manager Violence: Not At Risk (02/11/2021)   Humiliation, Afraid, Rape, and Kick questionnaire    Fear of Current or Ex-Partner: No    Emotionally Abused: No    Physically Abused: No    Sexually Abused: No     Outpatient Medications Prior to Visit  Medication Sig Dispense Refill   acetaminophen (TYLENOL) 500 MG tablet Take by mouth.     albuterol (VENTOLIN HFA) 108 (90 Base) MCG/ACT inhaler Inhale 1-2 puffs into the lungs every 4 (four) hours as needed for wheezing or shortness of breath. 18 g 2   Ascorbic Acid (VITAMIN C PO) Take by mouth daily.     buPROPion (WELLBUTRIN XL) 150 MG 24 hr tablet Take 1 tablet (150 mg total) by mouth every morning. 90 tablet 1   citalopram (CELEXA) 40 MG tablet TAKE 1 TABLET BY MOUTH EVERY DAY 90 tablet 1   Cyanocobalamin (VITAMIN B-12 PO) Take by mouth daily.     FARXIGA 10 MG TABS tablet TAKE 1 TABLET BY MOUTH DAILY BEFORE BREAKFAST. 90 tablet 3   fluticasone (FLONASE) 50 MCG/ACT nasal spray Place 1 spray into both nostrils as needed.      gabapentin (NEURONTIN) 600 MG tablet TAKE 1 TABLET IN THE MORNING AND 1 TABLET IN THE AFTERNOON AND 2TABS AT BEDTIME     insulin aspart (FIASP FLEXTOUCH) 100 UNIT/ML FlexTouch Pen Max daily 60 units (Patient taking differently: Max daily 62 units) 60 mL 3   Insulin Disposable Pump (OMNIPOD 5 G6 INTRO, GEN 5,) KIT 1 Device by Does not apply route every other day. 1 kit 0   Insulin Disposable Pump (OMNIPOD 5 G6 POD, GEN 5,) MISC 1 Device by Does not apply route every other day. 30 each 3   Insulin Glargine (BASAGLAR KWIKPEN) 100 UNIT/ML Inject 62 Units into the skin daily. 60 mL 3   Insulin Pen Needle (B-D UF III MINI PEN NEEDLES) 31G X 5 MM MISC USE AS DIRECTED WITH INSULIN EVERY MORNING, NOON, EVENING AND AT BEDTIME 400 each 3   Insulin Pen Needle 30G X 5 MM MISC 1 Device by Does not apply route in the morning, at noon, in the evening, and at bedtime. 400 each 3  levothyroxine (SYNTHROID) 150 MCG tablet Take 1 tablet (150 mcg total) by mouth daily. 90 tablet 3   losartan (COZAAR) 25 MG tablet Take 1 tablet (25 mg total) by mouth daily. 90 tablet 1   methocarbamol (ROBAXIN) 500 MG tablet Take 1 tablet (500 mg total) by mouth  every 8 (eight) hours as needed for muscle spasms. 30 tablet 0   Multiple Vitamins-Minerals (MULTIVITAMIN WITH MINERALS) tablet Take 1 tablet by mouth daily.     mycophenolate (CELLCEPT) 500 MG tablet Take 1,500 mg by mouth 2 (two) times daily.     Pyridoxine HCl (VITAMIN B-6 PO) Take by mouth daily.     Semaglutide, 1 MG/DOSE, (OZEMPIC, 1 MG/DOSE,) 4 MG/3ML SOPN Inject 1 mg into the skin once a week. 3 mL 5   VITAMIN D PO Take by mouth daily.     ACCU-CHEK AVIVA PLUS test strip USE AS INSTRUCTED TO CHECK BLOOD SUGAR 2 TIMES DAILY (Patient not taking: Reported on 02/03/2022) 100 strip 5   insulin aspart (NOVOLOG FLEXPEN) 100 UNIT/ML FlexPen 20 Units. (Patient not taking: Reported on 02/03/2022)     ketoconazole (NIZORAL) 2 % cream Apply 1 Application topically daily. 60 g 0   colchicine 0.6 MG tablet Take 2 tablets once today, followed by 1 tablet 1 hour later, and then take 1 tablet once daily from Day 2. 20 tablet 0   Continuous Blood Gluc Sensor (DEXCOM G6 SENSOR) MISC 1 Device by Does not apply route as directed. 9 each 3   Continuous Blood Gluc Transmit (DEXCOM G6 TRANSMITTER) MISC 1 Device by Does not apply route as directed. 1 each 3   liraglutide (VICTOZA) 18 MG/3ML SOPN Inject 1.8 mg into the skin daily. (Patient not taking: Reported on 02/03/2022) 27 mL 3   No facility-administered medications prior to visit.    Allergies  Allergen Reactions   Codeine Swelling   Influenza A (H1n1) Monoval Pf Anaphylaxis   Hydrocodone-Acetaminophen Other (See Comments)   Lisinopril Other (See Comments)   Metformin Other (See Comments)   Pregabalin Other (See Comments)   Jardiance [Empagliflozin] Other (See Comments)    dehydration    ROS Review of Systems  Constitutional:  Negative for chills and fever.  HENT:  Positive for congestion and sinus pressure. Negative for sinus pain.   Respiratory:  Positive for cough, shortness of breath and wheezing.   Cardiovascular:  Negative for chest pain and  palpitations.  Gastrointestinal:  Negative for diarrhea, nausea and vomiting.  Genitourinary:  Negative for dysuria and hematuria.  Musculoskeletal:  Positive for arthralgias, back pain, gait problem and neck pain.  Skin:  Positive for rash.  Neurological:  Positive for weakness. Negative for dizziness.  Psychiatric/Behavioral:  Negative for agitation and behavioral problems.       Objective:    Physical Exam Vitals reviewed.  Constitutional:      General: She is not in acute distress.    Appearance: She is obese. She is not diaphoretic.     Comments: In wheelchair  HENT:     Head: Normocephalic and atraumatic.     Nose: Congestion present.     Mouth/Throat:     Mouth: Mucous membranes are moist.  Eyes:     General: No scleral icterus.    Extraocular Movements: Extraocular movements intact.  Cardiovascular:     Rate and Rhythm: Normal rate and regular rhythm.     Pulses: Normal pulses.     Heart sounds: Normal heart sounds. No murmur heard. Pulmonary:  Breath sounds: Rhonchi (B/l) present. No wheezing or rales.  Musculoskeletal:     Cervical back: Neck supple. No tenderness.     Right lower leg: No edema.     Left lower leg: No edema.  Skin:    General: Skin is warm.     Findings: Rash (Erythematous rash underneath breasts and axilla) present.  Neurological:     General: No focal deficit present.     Mental Status: She is alert and oriented to person, place, and time.     Sensory: Sensory deficit present.     Motor: Weakness (4/5 in b/l LE) present.     Gait: Gait abnormal.  Psychiatric:        Mood and Affect: Mood is depressed.        Behavior: Behavior normal.     BP 138/72 (BP Location: Left Arm)   Pulse 93   Ht 5\' 1"  (1.549 m)   Wt 261 lb (118.4 kg)   SpO2 94%   BMI 49.32 kg/m  Wt Readings from Last 3 Encounters:  02/03/22 261 lb (118.4 kg)  12/17/21 261 lb 2 oz (118.4 kg)  12/14/21 261 lb (118.4 kg)    Lab Results  Component Value Date   TSH  4.28 12/14/2021   Lab Results  Component Value Date   WBC 2.6 (L) 01/14/2022   HGB 12.9 01/14/2022   HCT 40.5 01/14/2022   MCV 89.4 01/14/2022   PLT 63 (L) 01/14/2022   Lab Results  Component Value Date   NA 138 01/14/2022   K 3.7 01/14/2022   CHLORIDE 104 10/06/2016   CO2 25 01/14/2022   GLUCOSE 221 (H) 01/14/2022   BUN 14 01/14/2022   CREATININE 0.75 01/14/2022   BILITOT 0.8 01/14/2022   ALKPHOS 109 01/14/2022   AST 27 01/14/2022   ALT 12 01/14/2022   PROT 6.8 01/14/2022   ALBUMIN 3.9 01/14/2022   CALCIUM 10.5 (H) 01/14/2022   ANIONGAP 8 01/14/2022   EGFR 50 (L) 10/06/2016   GFR 50.27 (L) 08/24/2019   Lab Results  Component Value Date   CHOL 171 06/09/2021   Lab Results  Component Value Date   HDL 63.30 06/09/2021   Lab Results  Component Value Date   LDLCALC 80 06/09/2021   Lab Results  Component Value Date   TRIG 137.0 06/09/2021   Lab Results  Component Value Date   CHOLHDL 3 06/09/2021   Lab Results  Component Value Date   HGBA1C 7.9 (A) 12/14/2021      Assessment & Plan:   Problem List Items Addressed This Visit       Cardiovascular and Mediastinum   Essential hypertension    BP Readings from Last 1 Encounters:  02/03/22 138/72  Well-controlled with Losartan Counseled for compliance with the medications Advised DASH diet and moderate exercise/walking as tolerated        Respiratory   Acute bronchitis - Primary    Cough and mild dyspnea for about the last 10 days Started empiric azithromycin Promethazine DM as needed for cough Would avoid steroids due to history of DM      Relevant Medications   promethazine-dextromethorphan (PROMETHAZINE-DM) 6.25-15 MG/5ML syrup     Endocrine   Type 2 diabetes mellitus with diabetic polyneuropathy, with long-term current use of insulin (HCC)   Relevant Medications   insulin aspart (NOVOLOG FLEXPEN) 100 UNIT/ML FlexPen     Other   Multifactorial gait disorder    Likely due to diabetic  neuropathy, OA  of knee and ankle, DDD of lumbar spine and muscular atrophy Can benefit from electric wheelchair      Recurrent falls    Likely due to diabetic neuropathy, OA of knee and ankle, DDD of lumbar spine and muscular atrophy Currently uses cane  Dependent for ADLs  Has not used manual wheelchair, but needs an electric wheelchair for better mobility in her home independently, which will improve her quality of life  She can safely use electric wheelchair. She is willing and motivated to use the power mobility device in the home.      Malignant neoplasm of upper-outer quadrant of left breast in female, estrogen receptor positive (HCC)    S/p lumpectomy, followed by Oncology On Anastrazole Has recurrence, had radiation      Relevant Medications   azithromycin (ZITHROMAX) 250 MG tablet   Allergy to influenza vaccine   Recurrent major depression in remission (HCC)    Overall well-controlled with Celexa and Wellbutrin      Ocular cicatricial pemphigoid    On mycophenolate and oral steroids Followed by ophthalmology at [facility]      Other Visit Diagnoses     Acute recurrent sinusitis, unspecified location       Relevant Medications   azithromycin (ZITHROMAX) 250 MG tablet   promethazine-dextromethorphan (PROMETHAZINE-DM) 6.25-15 MG/5ML syrup   Refused influenza vaccine           Meds ordered this encounter  Medications   azithromycin (ZITHROMAX) 250 MG tablet    Sig: Take 2 tablets on day 1, then 1 tablet daily on days 2 through 5    Dispense:  6 tablet    Refill:  0   promethazine-dextromethorphan (PROMETHAZINE-DM) 6.25-15 MG/5ML syrup    Sig: Take 5 mLs by mouth 4 (four) times daily as needed for cough.    Dispense:  180 mL    Refill:  0    Follow-up: Return in about 4 months (around 06/04/2022), or if symptoms worsen or fail to improve.    Anabel Halon, MD

## 2022-02-03 NOTE — Assessment & Plan Note (Signed)
On mycophenolate and oral steroids Followed by ophthalmology at [facility]

## 2022-02-03 NOTE — Assessment & Plan Note (Signed)
S/p lumpectomy, followed by Oncology On Anastrazole Has recurrence, had radiation

## 2022-02-03 NOTE — Patient Instructions (Signed)
Please start taking Azithromycin as prescribed.  Please continue taking Promethazine-DM syrup as needed for cough.

## 2022-02-03 NOTE — Assessment & Plan Note (Signed)
BP Readings from Last 1 Encounters:  02/03/22 138/72   Well-controlled with Losartan Counseled for compliance with the medications Advised DASH diet and moderate exercise/walking as tolerated

## 2022-02-03 NOTE — Assessment & Plan Note (Addendum)
Overall well-controlled with Celexa and Wellbutrin

## 2022-02-03 NOTE — Assessment & Plan Note (Signed)
Likely due to diabetic neuropathy, OA of knee and ankle, DDD of lumbar spine and muscular atrophy Currently uses cane  Dependent for ADLs  Has not used manual wheelchair, but needs an electric wheelchair for better mobility in her home independently, which will improve her quality of life  She can safely use electric wheelchair. She is willing and motivated to use the power mobility device in the home.

## 2022-02-03 NOTE — Addendum Note (Signed)
Addended byIhor Dow on: 02/03/2022 01:30 PM   Modules accepted: Orders

## 2022-02-03 NOTE — Assessment & Plan Note (Signed)
Cough and mild dyspnea for about the last 10 days Started empiric azithromycin Promethazine DM as needed for cough Would avoid steroids due to history of DM

## 2022-02-03 NOTE — Assessment & Plan Note (Signed)
Likely due to diabetic neuropathy, OA of knee and ankle, DDD of lumbar spine and muscular atrophy Can benefit from electric wheelchair

## 2022-02-11 ENCOUNTER — Inpatient Hospital Stay: Payer: Medicare Other | Attending: Hematology and Oncology | Admitting: Hematology and Oncology

## 2022-02-11 ENCOUNTER — Encounter: Payer: Self-pay | Admitting: Hematology and Oncology

## 2022-02-11 ENCOUNTER — Inpatient Hospital Stay: Payer: Medicare Other

## 2022-02-11 ENCOUNTER — Other Ambulatory Visit: Payer: Self-pay

## 2022-02-11 VITALS — BP 143/63 | HR 86 | Temp 98.4°F | Resp 18 | Ht 61.0 in | Wt 257.1 lb

## 2022-02-11 DIAGNOSIS — Z79811 Long term (current) use of aromatase inhibitors: Secondary | ICD-10-CM | POA: Insufficient documentation

## 2022-02-11 DIAGNOSIS — Z170 Estrogen receptor positive status [ER+]: Secondary | ICD-10-CM

## 2022-02-11 DIAGNOSIS — C50411 Malignant neoplasm of upper-outer quadrant of right female breast: Secondary | ICD-10-CM | POA: Insufficient documentation

## 2022-02-11 DIAGNOSIS — C50412 Malignant neoplasm of upper-outer quadrant of left female breast: Secondary | ICD-10-CM

## 2022-02-11 DIAGNOSIS — Z5111 Encounter for antineoplastic chemotherapy: Secondary | ICD-10-CM | POA: Diagnosis not present

## 2022-02-11 LAB — CBC WITH DIFFERENTIAL (CANCER CENTER ONLY)
Abs Immature Granulocytes: 0.01 10*3/uL (ref 0.00–0.07)
Basophils Absolute: 0 10*3/uL (ref 0.0–0.1)
Basophils Relative: 0 %
Eosinophils Absolute: 0.1 10*3/uL (ref 0.0–0.5)
Eosinophils Relative: 3 %
HCT: 39.6 % (ref 36.0–46.0)
Hemoglobin: 12.8 g/dL (ref 12.0–15.0)
Immature Granulocytes: 0 %
Lymphocytes Relative: 18 %
Lymphs Abs: 0.6 10*3/uL — ABNORMAL LOW (ref 0.7–4.0)
MCH: 28.5 pg (ref 26.0–34.0)
MCHC: 32.3 g/dL (ref 30.0–36.0)
MCV: 88.2 fL (ref 80.0–100.0)
Monocytes Absolute: 0.2 10*3/uL (ref 0.1–1.0)
Monocytes Relative: 6 %
Neutro Abs: 2.3 10*3/uL (ref 1.7–7.7)
Neutrophils Relative %: 73 %
Platelet Count: 75 10*3/uL — ABNORMAL LOW (ref 150–400)
RBC: 4.49 MIL/uL (ref 3.87–5.11)
RDW: 14.9 % (ref 11.5–15.5)
WBC Count: 3.1 10*3/uL — ABNORMAL LOW (ref 4.0–10.5)
nRBC: 0 % (ref 0.0–0.2)

## 2022-02-11 LAB — CMP (CANCER CENTER ONLY)
ALT: 14 U/L (ref 0–44)
AST: 28 U/L (ref 15–41)
Albumin: 4.1 g/dL (ref 3.5–5.0)
Alkaline Phosphatase: 81 U/L (ref 38–126)
Anion gap: 10 (ref 5–15)
BUN: 17 mg/dL (ref 8–23)
CO2: 24 mmol/L (ref 22–32)
Calcium: 10.3 mg/dL (ref 8.9–10.3)
Chloride: 104 mmol/L (ref 98–111)
Creatinine: 0.8 mg/dL (ref 0.44–1.00)
GFR, Estimated: >60 mL/min (ref >60)
Glucose, Bld: 171 mg/dL — ABNORMAL HIGH (ref 70–99)
Potassium: 3.9 mmol/L (ref 3.5–5.1)
Sodium: 138 mmol/L (ref 135–145)
Total Bilirubin: 0.8 mg/dL (ref 0.3–1.2)
Total Protein: 7.1 g/dL (ref 6.5–8.1)

## 2022-02-11 MED ORDER — FULVESTRANT 250 MG/5ML IM SOSY
500.0000 mg | PREFILLED_SYRINGE | Freq: Once | INTRAMUSCULAR | Status: AC
  Administered 2022-02-11: 500 mg via INTRAMUSCULAR
  Filled 2022-02-11: qty 10

## 2022-02-11 NOTE — Progress Notes (Signed)
[name] [name]  [phone] Fax:([phone]     ID: [name] [name] DOB: [birth year]  MR#: [id number]  CHE#:[id number]  Patient Care Team: [name], MD as PCP - General (Internal Medicine) [name], RN as Oncology Nurse Navigator [name], RN as Oncology Nurse Navigator [name], MD as Consulting Physician (Hematology and Oncology) Neldon Labella, RN as Case Manager Gery Pray, MD as Consulting Physician (Radiation Oncology) Coralie Keens, MD as Consulting Physician (General Surgery) OTHER MD:  CHIEF COMPLAINT: Estrogen receptor positive invasive breast cancer  CURRENT TREATMENT: anastrozole  BREAST CANCER HISTORY:  Oncology History  Malignant neoplasm of upper-outer quadrant of left breast in female, estrogen receptor positive ([place])  02/02/2015 - 02/2021 Anti-estrogen oral therapy   Anastrozole daily, completed 6 years   08/04/2015 Surgery   72 y.o. [name] [name] woman status post left breast upper outer quadrant lumpectomy 08/04/2015 for a pT1c pNX, stage I A invasive ductal carcinoma, estrogen and progesterone receptor positive, HER-2 not amplified, with no HER-2 amplification, anterior margin was focally positive   08/04/2015 Oncotype testing   Oncotype DX score of 12 predicts a 10 year risk of recurrence outside the breast of 8% if the patient's only systemic therapy is tamoxifen for 5 years. It also predicts no benefit from therapy.      09/12/2015 Surgery   additional Left breast surgery 09/12/2015 cleared the margins and found all 5 axillary lymph nodes sampled clear for a final stage pT1c pN0, stage IA   10/22/2015 - 12/08/2015 Radiation Therapy   Adjuvant radiation 10/22/15 - 12/08/15              1) Left breast: 50.4 Gy in 28 fractions              2) Left breast boost: 10 Gy in 5 fractions   10/26/2021 Cancer Staging   Staging form: Breast, AJCC 7th Edition - Pathologic: Stage IA (T1c, N0, cM0) - Signed by Gardenia Phlegm, NP on 10/26/2021   Malignant neoplasm of right breast ([place])  06/10/2021 Mammogram   asymmetry within the OUTER RIGHT breast on CC views warrants further evaluation.   Targeted ultrasound : showing hypoechoic oval mass with indistinct margins in the 10:30 o'clock location of the RIGHT breast 9 centimeters from nipple measuring 0.5 x 0.4 x 0.4 centimeters. This area is approximately 1.8 centimeters from the palpable bruise, bruise was thought to be fat necrosis.   06/23/2021 Initial Diagnosis   Right breast needle core biopsy IDC, grade one, ER 90% positive, PR 60% positive, Ki67 2%, HER-2 IHC 2+, FISH negative   07/08/2021 Treatment Plan Change   Faslodex every 4 weeks, Abemaciclib to be added    08/12/2021 Surgery   Right lumpectomy: invasive ductal carcinoma with clear margins of resection, final pathology showed tumor measuring 1.1 cm x 0.7 x 8.7,  grade 3   previous biopsy showed ER 90% positive, PR 60% positive, Ki-67 was 2% HER2 2+ by IHC and negative by [facility]   08/12/2021 Oncotype testing   12   09/22/2021 - 10/30/2021 Radiation Therapy   Site Technique Total Dose (Gy) Dose per Fx (Gy) Completed Fx Beam Energies  Breast, Right: Breast_R 3D 45/45 1.8 25/25 15X, 10XFFF  Breast, Right: Breast_R_Bst 3D 6/6 2 3/3 10X, 15X     Interval History  Patient is here for follow-up.   Since last visit, she continues to deal with cough, sinuses, taking z pak.  She says after her last faslodex,  it hurt for almost 2/3 weeks. She had some changes in her diabetes medications, now started Ozempic. No changes in the breast except for some swelling at the surgical scar. Rest of the pertinent 10 point ROS reviewed and negative  PAST MEDICAL HISTORY: Past Medical History:  Diagnosis Date   Abdominal wall contusion 12/09/2011   Achilles tendon contracture, bilateral 06/03/2016   Anxiety    Arthritis    Blood dyscrasia    low platelet count- sees [name] , [name]  ( Note in [place] from 07/2014) at [facility]) 2017   Left Breast   Breast cancer ([place]) 07/2021   right breast IDC   Cancer ([place])    left breast   Chest wall contusion 12/09/2011   Cirrhosis of liver ([place])    Cough    Diabetes mellitus without complication ([place])    Diabetic neuropathy ([place])    Diarrhea due to drug 01/29/2021   Dyspnea    Gout    Gout    History of radiation therapy 10/22/15 - 12/08/15   left breast 50.4 Gy, boost to 10 Gy   History of radiation therapy    Right breast- 09/22/21-10/30/21- Dr. Gery Pray   Hypertension    Hypothyroidism    Migraine    Multifactorial gait disorder 01/12/2013   Neuromuscular disorder ([place])    diabetic neuropathy   Neuropathy    Personal history of radiation therapy 2017   Left Breast Cancer   Posterior tibial tendon dysfunction (PTTD) of right lower extremity 10/09/2019   Sleep apnea    CPAP nightly   Spleen enlarged    Thyroid disease     PAST SURGICAL HISTORY: Past Surgical History:  Procedure Laterality Date   ANKLE FUSION Right 2022   BREAST BIOPSY Left 08/04/2015   Procedure: LEFT BREAST BIOPSY WITH NEEDLE LOCALIZATION;  Surgeon: [name], MD;  Location: [place];  Service: General;  Laterality: Left;   BREAST BIOPSY Right 06/23/2021   BREAST DUCTAL SYSTEM EXCISION Left 08/04/2015   Procedure: LEFT EXCISION DUCTAL SYSTEM BREAST;  Surgeon: [name], MD;  Location: [place];  Service: General;  Laterality: Left;   BREAST LUMPECTOMY Left 08/2015   BREAST LUMPECTOMY WITH AXILLARY LYMPH NODE BIOPSY Left 09/12/2015   Procedure: RE-EXCISION OF LEFT BREAST LUMPECTOMY WITH LEFT AXILLARY LYMPH NODE BIOPSY;  Surgeon: [name], MD;  Location: [place];  Service: General;  Laterality: Left;   BREAST LUMPECTOMY WITH RADIOACTIVE SEED LOCALIZATION Right 08/12/2021   Procedure: RIGHT BREAST LUMPECTOMY WITH RADIOACTIVE SEED LOCALIZATION;  Surgeon:  Coralie Keens, MD;  Location: [place];  Service: General;  Laterality: Right;  LMA   CHOLECYSTECTOMY     EYE SURGERY Bilateral 2022   Lashes growing backwards into the eye   JOINT REPLACEMENT     left knee   JOINT REPLACEMENT  2017   right knee   REVERSE SHOULDER ARTHROPLASTY Right 09/07/2018   Procedure: RIGHT REVERSE SHOULDER ARTHROPLASTY;  Surgeon: Meredith Pel, MD;  Location: [place];  Service: Orthopedics;  Laterality: Right;   TOTAL KNEE ARTHROPLASTY Right 02/28/2015   Procedure: RIGHT TOTAL KNEE ARTHROPLASTY;  Surgeon: [name], MD;  Location: [facility];  Service: Orthopedics;  Laterality: Right;   TUBAL LIGATION      FAMILY HISTORY Family History  Problem Relation Age of Onset   Hypothyroidism Mother    Alzheimer's disease Mother    Diabetes Father  Cancer Sister        GYN cancer   Hypothyroidism Sister    Heart disease Brother    Arthritis Son    Diabetes Son    Arthritis Son    Cancer Maternal Grandmother    Heart failure Maternal Grandfather    Bone cancer Paternal Grandmother    Heart failure Paternal Grandmother   The patient's father died from an encephalitis at the age of 31. The patient's mother is living at age 31. The patient had one brother, 3 sisters. A paternal grandf mother had "bone cancer". A paternal aunt, present today also had "bone cancer", but on further questioning this was myeloma. The patient's sister [name] was diagnosed with ovarian and uterine cancer and has been genetically tested but the test was negative   GYNECOLOGIC HISTORY:  No LMP recorded. Patient is postmenopausal. Menarche age 66 and first live birth age 37, menopause in her 31s. The patient never took hormone replacement or oral contraceptives.   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 Her husband [name] is a Games developer. At home is just the 2 of them, the patient's aunt  who is under hospice care for her myeloma, and the patient's dog. Son [name] lives in [place] and works for an Scientist, research (medical).Pandora Leiter Jeneen Rinks to be also lives in [place] and is a Tax inspector. Daughter Alinda Money lives in [place] and works for the [employer] school system as F Careers information officer. The patient has 5 grandchildren. She attends a [employer].    ADVANCED DIRECTIVES: In the absence of any documentation to the contrary, the patient's spouse is their HCPOA.    HEALTH MAINTENANCE: Social History   Tobacco Use   Smoking status: Never   Smokeless tobacco: Never  Vaping Use   Vaping Use: Never used  Substance Use Topics   Alcohol use: No   Drug use: No     Colonoscopy: 2015/Buccini  PAP:  Bone density:   Allergies  Allergen Reactions   Codeine Swelling   Influenza A (H1n1) Monoval Pf Anaphylaxis   Hydrocodone-Acetaminophen Other (See Comments)   Lisinopril Other (See Comments)   Metformin Other (See Comments)   Pregabalin Other (See Comments)   Jardiance [Empagliflozin] Other (See Comments)    dehydration    Current Outpatient Medications  Medication Sig Dispense Refill   ACCU-CHEK AVIVA PLUS test strip USE AS INSTRUCTED TO CHECK BLOOD SUGAR 2 TIMES DAILY (Patient not taking: Reported on 02/03/2022) 100 strip 5   acetaminophen (TYLENOL) 500 MG tablet Take by mouth.     albuterol (VENTOLIN HFA) 108 (90 Base) MCG/ACT inhaler Inhale 1-2 puffs into the lungs every 4 (four) hours as needed for wheezing or shortness of breath. 18 g 2   Ascorbic Acid (VITAMIN C PO) Take by mouth daily.     buPROPion (WELLBUTRIN XL) 150 MG 24 hr tablet Take 1 tablet (150 mg total) by mouth every morning. 90 tablet 1   citalopram (CELEXA) 40 MG tablet TAKE 1 TABLET BY MOUTH EVERY DAY 90 tablet 1   Cyanocobalamin (VITAMIN B-12 PO) Take by mouth daily.     FARXIGA 10 MG TABS tablet TAKE 1 TABLET BY MOUTH DAILY BEFORE BREAKFAST. 90 tablet 3    fluticasone (FLONASE) 50 MCG/ACT nasal spray Place 1 spray into both nostrils as needed.      gabapentin (NEURONTIN) 600 MG tablet TAKE 1 TABLET IN THE MORNING AND  1 TABLET IN THE AFTERNOON AND 2TABS AT BEDTIME     insulin aspart (FIASP FLEXTOUCH) 100 UNIT/ML FlexTouch Pen Max daily 60 units (Patient taking differently: Max daily 62 units) 60 mL 3   insulin aspart (NOVOLOG FLEXPEN) 100 UNIT/ML FlexPen 20 Units. (Patient not taking: Reported on 02/03/2022)     Insulin Disposable Pump (OMNIPOD 5 G6 INTRO, GEN 5,) KIT 1 Device by Does not apply route every other day. 1 kit 0   Insulin Disposable Pump (OMNIPOD 5 G6 POD, GEN 5,) MISC 1 Device by Does not apply route every other day. 30 each 3   Insulin Glargine (BASAGLAR KWIKPEN) 100 UNIT/ML Inject 62 Units into the skin daily. 60 mL 3   Insulin Pen Needle (B-D UF III MINI PEN NEEDLES) 31G X 5 MM MISC USE AS DIRECTED WITH INSULIN EVERY MORNING, NOON, EVENING AND AT BEDTIME 400 each 3   Insulin Pen Needle 30G X 5 MM MISC 1 Device by Does not apply route in the morning, at noon, in the evening, and at bedtime. 400 each 3   ketoconazole (NIZORAL) 2 % cream Apply 1 Application topically daily. 60 g 0   levothyroxine (SYNTHROID) 150 MCG tablet Take 1 tablet (150 mcg total) by mouth daily. 90 tablet 3   losartan (COZAAR) 25 MG tablet Take 1 tablet (25 mg total) by mouth daily. 90 tablet 1   methocarbamol (ROBAXIN) 500 MG tablet Take 1 tablet (500 mg total) by mouth every 8 (eight) hours as needed for muscle spasms. 30 tablet 0   Multiple Vitamins-Minerals (MULTIVITAMIN WITH MINERALS) tablet Take 1 tablet by mouth daily.     mycophenolate (CELLCEPT) 500 MG tablet Take 1,500 mg by mouth 2 (two) times daily.     promethazine-dextromethorphan (PROMETHAZINE-DM) 6.25-15 MG/5ML syrup Take 5 mLs by mouth 4 (four) times daily as needed for cough. 180 mL 0   Pyridoxine HCl (VITAMIN B-6 PO) Take by mouth daily.     Semaglutide, 1 MG/DOSE, (OZEMPIC, 1 MG/DOSE,) 4 MG/3ML  SOPN Inject 1 mg into the skin once a week. 3 mL 5   VITAMIN D PO Take by mouth daily.     No current facility-administered medications for this visit.    OBJECTIVE: White woman examined in a wheelchair  Vitals:   02/11/22 1136  BP: (!) 143/63  Pulse: 86  Resp: 18  Temp: 98.4 F (36.9 C)  SpO2: 97%     Wt Readings from Last 3 Encounters:  02/11/22 257 lb 1.6 oz (116.6 kg)  02/03/22 261 lb (118.4 kg)  12/17/21 261 lb 2 oz (118.4 kg)   Body mass index is 48.58 kg/m.    ECOG FS:2 - Symptomatic, <50% confined to bed  Physical Exam Constitutional:      Appearance: Normal appearance.     Comments:    Pulmonary:     Comments: . Chest:       Comments: Right breast exam with post lumpectomy seroma at the scar. Darkening of skin.  Musculoskeletal:     Cervical back: Normal range of motion and neck supple. No rigidity.     Comments: She is in a wheelchair today  Lymphadenopathy:     Cervical: No cervical adenopathy.  Neurological:     Mental Status: She is alert.      LAB RESULTS:  CMP     Component Value Date/Time   NA 138 02/11/2022 1051   NA 138 07/05/2018 1635   NA 142 10/06/2016 1211   K 3.9 02/11/2022  1051   K 3.8 10/06/2016 1211   CL 104 02/11/2022 1051   CO2 24 02/11/2022 1051   CO2 25 10/06/2016 1211   GLUCOSE 171 (H) 02/11/2022 1051   GLUCOSE 179 (H) 10/06/2016 1211   BUN 17 02/11/2022 1051   BUN 19 07/05/2018 1635   BUN 16.9 10/06/2016 1211   CREATININE 0.80 02/11/2022 1051   CREATININE 1.09 (H) 09/28/2017 1516   CREATININE 1.1 10/06/2016 1211   CALCIUM 10.3 02/11/2022 1051   CALCIUM 10.4 10/06/2016 1211   PROT 7.1 02/11/2022 1051   PROT 7.5 07/05/2018 1635   PROT 7.6 10/06/2016 1211   ALBUMIN 4.1 02/11/2022 1051   ALBUMIN 4.6 07/05/2018 1635   ALBUMIN 3.9 10/06/2016 1211   AST 28 02/11/2022 1051   AST 33 10/06/2016 1211   ALT 14 02/11/2022 1051   ALT 20 10/06/2016 1211   ALKPHOS 81 02/11/2022 1051   ALKPHOS 86 10/06/2016 1211    BILITOT 0.8 02/11/2022 1051   BILITOT 0.52 10/06/2016 1211   GFRNONAA >60 02/11/2022 1051   GFRNONAA 52 (L) 09/28/2017 1516   GFRAA >60 11/07/2018 1133   GFRAA 61 09/28/2017 1516    INo results found for: "SPEP", "UPEP"  Lab Results  Component Value Date   WBC 3.1 (L) 02/11/2022   NEUTROABS 2.3 02/11/2022   HGB 12.8 02/11/2022   HCT 39.6 02/11/2022   MCV 88.2 02/11/2022   PLT 75 (L) 02/11/2022      Chemistry      Component Value Date/Time   NA 138 02/11/2022 1051   NA 138 07/05/2018 1635   NA 142 10/06/2016 1211   K 3.9 02/11/2022 1051   K 3.8 10/06/2016 1211   CL 104 02/11/2022 1051   CO2 24 02/11/2022 1051   CO2 25 10/06/2016 1211   BUN 17 02/11/2022 1051   BUN 19 07/05/2018 1635   BUN 16.9 10/06/2016 1211   CREATININE 0.80 02/11/2022 1051   CREATININE 1.09 (H) 09/28/2017 1516   CREATININE 1.1 10/06/2016 1211      Component Value Date/Time   CALCIUM 10.3 02/11/2022 1051   CALCIUM 10.4 10/06/2016 1211   ALKPHOS 81 02/11/2022 1051   ALKPHOS 86 10/06/2016 1211   AST 28 02/11/2022 1051   AST 33 10/06/2016 1211   ALT 14 02/11/2022 1051   ALT 20 10/06/2016 1211   BILITOT 0.8 02/11/2022 1051   BILITOT 0.52 10/06/2016 1211       No results found for: "LABCA2"  No components found for: "LABCA125"  No results for input(s): "INR" in the last 168 hours.  Urinalysis    Component Value Date/Time   COLORURINE YELLOW 08/30/2018 1357   APPEARANCEUR CLEAR 08/30/2018 1357   LABSPEC 1.024 08/30/2018 1357   PHURINE 5.0 08/30/2018 1357   GLUCOSEU >=500 (A) 08/30/2018 1357   HGBUR NEGATIVE 08/30/2018 1357   BILIRUBINUR NEGATIVE 08/30/2018 1357   KETONESUR NEGATIVE 08/30/2018 1357   PROTEINUR NEGATIVE 08/30/2018 1357   UROBILINOGEN 0.2 12/09/2011 1907   NITRITE NEGATIVE 08/30/2018 1357   LEUKOCYTESUR NEGATIVE 08/30/2018 1357    STUDIES: No results found.   ELIGIBLE FOR AVAILABLE RESEARCH PROTOCOL: no  ASSESSMENT: 72 y.o. [name] status post left  breast upper outer quadrant lumpectomy 08/04/2015 for a pT1c pNX, stage I A invasive ductal carcinoma, estrogen and progesterone receptor positive, HER-2 not amplified, with no HER-2 amplification.  (a) anterior margin was focally positive  (1) additional Left breast surgery 09/12/2015 cleared the margins and found all 5 axillary lymph nodes sampled  clear for a final stage pT1c pN0, stage IA  (2) Oncotype DX score of 12 predicts a 10 year risk of recurrence outside the breast of 8% if the patient's only systemic therapy is tamoxifen for 5 years. It also predicts no benefit from therapy.   (3) Adjuvant radiation 10/22/15 - 12/08/15   1) Left breast: 50.4 Gy in 28 fractions              2) Left breast boost: 10 Gy in 5 fractions  (4) started  anastrozole 02/02/2015, completed 6 yrs.  (5) She has mammogram recently 06/20/2021, possible asymmetry within the OUTER RIGHT breast on CC views warrants further evaluation. Targeted ultrasound is performed, showing hypoechoic oval mass with indistinct margins in the 10:30 o'clock location of the RIGHT breast 9 centimeters from nipple measuring 0.5 x 0.4 x 0.4 centimeters. This area is approximately 1.8 centimeters from the palpable bruise, bruise was thought to be fat necrosis.  Right breast lumpectomy showed invasive ductal carcinoma with clear margins of resection, final pathology showed tumor measuring 1.1 cm x 0.7 x 8.7 grade 3 previous biopsy showed ER 90% positive, PR 60% positive, Ki-67 was 2% HER2 2+ by IHC and negative by FISH  PLAN:  She is here for follow-up.  She is currently on adjuvant Faslodex.  She is doing quite well with the injections except for soreness at the site of injection.  In the past we have discussed about considering abemaciclib but given her multiple comorbidities, borderline performance status, we will defer this to a later date.  We have also discussed about alternate of antiestrogen therapy with tamoxifen but once again she  is very sedentary hence she [place] have high risk of blood clots.  She is comfortable with the injections and will continue Faslodex as recommended for 5 years.   She will repeat mammogram in July 2024.  With regards to her recurrent cough, sinus infections, she is following up with her PCP.  I also encouraged her to reach out to [place] if she has any new questions and she expressed understanding.  She will return to clinic in about 3 months for follow-up or sooner as needed  Faslodex every 28 days.  Total time spent 30 minutes   *Total Encounter Time as defined by the Centers for Medicare and Medicaid Services includes, in addition to the face-to-face time of a patient visit (documented in the note above) non-face-to-face time: obtaining and reviewing outside history, ordering and reviewing medications, tests or procedures, care coordination (communications with other health care professionals or caregivers) and documentation in the medical record.

## 2022-02-15 ENCOUNTER — Ambulatory Visit: Payer: BC Managed Care – PPO | Admitting: Internal Medicine

## 2022-02-17 ENCOUNTER — Telehealth: Payer: Self-pay | Admitting: Internal Medicine

## 2022-02-17 NOTE — Telephone Encounter (Signed)
[name] [name], [facility], [phone]   Called wanting to know if we have received a medical requisition form & if it has been completed?

## 2022-02-17 NOTE — Telephone Encounter (Signed)
Spoke to [employer]

## 2022-02-19 ENCOUNTER — Other Ambulatory Visit: Payer: Self-pay

## 2022-02-22 ENCOUNTER — Encounter: Payer: BC Managed Care – PPO | Admitting: Internal Medicine

## 2022-02-23 ENCOUNTER — Other Ambulatory Visit: Payer: Self-pay

## 2022-02-24 ENCOUNTER — Encounter: Payer: Self-pay | Admitting: Hematology and Oncology

## 2022-03-11 ENCOUNTER — Other Ambulatory Visit: Payer: Self-pay | Admitting: Hematology and Oncology

## 2022-03-11 ENCOUNTER — Other Ambulatory Visit: Payer: Self-pay

## 2022-03-11 ENCOUNTER — Inpatient Hospital Stay: Payer: Medicare Other

## 2022-03-11 ENCOUNTER — Telehealth: Payer: Self-pay

## 2022-03-11 ENCOUNTER — Telehealth: Payer: Self-pay | Admitting: Internal Medicine

## 2022-03-11 ENCOUNTER — Inpatient Hospital Stay: Payer: Medicare Other | Attending: Hematology and Oncology

## 2022-03-11 VITALS — BP 130/55 | HR 80 | Temp 98.4°F | Resp 20

## 2022-03-11 DIAGNOSIS — Z170 Estrogen receptor positive status [ER+]: Secondary | ICD-10-CM

## 2022-03-11 DIAGNOSIS — C50411 Malignant neoplasm of upper-outer quadrant of right female breast: Secondary | ICD-10-CM | POA: Diagnosis not present

## 2022-03-11 DIAGNOSIS — Z5111 Encounter for antineoplastic chemotherapy: Secondary | ICD-10-CM | POA: Insufficient documentation

## 2022-03-11 LAB — CBC WITH DIFFERENTIAL (CANCER CENTER ONLY)
Abs Immature Granulocytes: 0.01 10*3/uL (ref 0.00–0.07)
Basophils Absolute: 0 10*3/uL (ref 0.0–0.1)
Basophils Relative: 1 %
Eosinophils Absolute: 0.1 10*3/uL (ref 0.0–0.5)
Eosinophils Relative: 3 %
HCT: 40 % (ref 36.0–46.0)
Hemoglobin: 12.8 g/dL (ref 12.0–15.0)
Immature Granulocytes: 0 %
Lymphocytes Relative: 17 %
Lymphs Abs: 0.4 10*3/uL — ABNORMAL LOW (ref 0.7–4.0)
MCH: 28.6 pg (ref 26.0–34.0)
MCHC: 32 g/dL (ref 30.0–36.0)
MCV: 89.3 fL (ref 80.0–100.0)
Monocytes Absolute: 0.2 10*3/uL (ref 0.1–1.0)
Monocytes Relative: 9 %
Neutro Abs: 1.6 10*3/uL — ABNORMAL LOW (ref 1.7–7.7)
Neutrophils Relative %: 70 %
Platelet Count: 58 10*3/uL — ABNORMAL LOW (ref 150–400)
RBC: 4.48 MIL/uL (ref 3.87–5.11)
RDW: 14.8 % (ref 11.5–15.5)
WBC Count: 2.3 10*3/uL — ABNORMAL LOW (ref 4.0–10.5)
nRBC: 0 % (ref 0.0–0.2)

## 2022-03-11 LAB — CMP (CANCER CENTER ONLY)
ALT: 13 U/L (ref 0–44)
AST: 25 U/L (ref 15–41)
Albumin: 3.7 g/dL (ref 3.5–5.0)
Alkaline Phosphatase: 93 U/L (ref 38–126)
Anion gap: 9 (ref 5–15)
BUN: 18 mg/dL (ref 8–23)
CO2: 26 mmol/L (ref 22–32)
Calcium: 10.3 mg/dL (ref 8.9–10.3)
Chloride: 105 mmol/L (ref 98–111)
Creatinine: 0.74 mg/dL (ref 0.44–1.00)
GFR, Estimated: >60 mL/min (ref >60)
Glucose, Bld: 175 mg/dL — ABNORMAL HIGH (ref 70–99)
Potassium: 3.6 mmol/L (ref 3.5–5.1)
Sodium: 140 mmol/L (ref 135–145)
Total Bilirubin: 0.7 mg/dL (ref 0.3–1.2)
Total Protein: 6.5 g/dL (ref 6.5–8.1)

## 2022-03-11 MED ORDER — UNABLE TO FIND: 1.0000 | Freq: Every day | 0 refills | Status: DC

## 2022-03-11 MED ORDER — FULVESTRANT 250 MG/5ML IM SOSY
500.0000 mg | PREFILLED_SYRINGE | Freq: Once | INTRAMUSCULAR | Status: AC
  Administered 2022-03-11: 500 mg via INTRAMUSCULAR
  Filled 2022-03-11: qty 10

## 2022-03-11 MED ORDER — LANTUS SOLOSTAR 100 UNIT/ML ~~LOC~~ SOPN: 62.0000 [IU] | PEN_INJECTOR | Freq: Every day | SUBCUTANEOUS | 4 refills | Status: DC

## 2022-03-11 NOTE — Telephone Encounter (Signed)
SPOKE TO [name] [name]

## 2022-03-11 NOTE — Telephone Encounter (Signed)
Keya from [facility]  called need more information on rx received for the wheelchair. Please return call to [phone].

## 2022-03-11 NOTE — Progress Notes (Signed)
Faslodex order placed.

## 2022-03-11 NOTE — Telephone Encounter (Signed)
Received fax that Lantus is preferred or a PA needs to be processed for Basaglar.

## 2022-03-17 ENCOUNTER — Encounter: Payer: Self-pay | Admitting: Hematology and Oncology

## 2022-03-17 ENCOUNTER — Other Ambulatory Visit: Payer: Self-pay

## 2022-04-06 ENCOUNTER — Other Ambulatory Visit: Payer: Self-pay

## 2022-04-06 DIAGNOSIS — Z170 Estrogen receptor positive status [ER+]: Secondary | ICD-10-CM

## 2022-04-08 ENCOUNTER — Inpatient Hospital Stay: Payer: Medicare Other | Attending: Hematology and Oncology

## 2022-04-08 ENCOUNTER — Inpatient Hospital Stay: Payer: Medicare Other

## 2022-04-08 ENCOUNTER — Encounter: Payer: Self-pay | Admitting: Radiology

## 2022-04-08 ENCOUNTER — Other Ambulatory Visit: Payer: Self-pay

## 2022-04-08 VITALS — BP 128/53 | HR 77 | Temp 98.4°F | Resp 20

## 2022-04-08 DIAGNOSIS — Z170 Estrogen receptor positive status [ER+]: Secondary | ICD-10-CM

## 2022-04-08 DIAGNOSIS — C50412 Malignant neoplasm of upper-outer quadrant of left female breast: Secondary | ICD-10-CM | POA: Diagnosis not present

## 2022-04-08 DIAGNOSIS — Z5111 Encounter for antineoplastic chemotherapy: Secondary | ICD-10-CM | POA: Insufficient documentation

## 2022-04-08 LAB — CMP (CANCER CENTER ONLY)
ALT: 12 U/L (ref 0–44)
AST: 24 U/L (ref 15–41)
Albumin: 4 g/dL (ref 3.5–5.0)
Alkaline Phosphatase: 90 U/L (ref 38–126)
Anion gap: 8 (ref 5–15)
BUN: 18 mg/dL (ref 8–23)
CO2: 27 mmol/L (ref 22–32)
Calcium: 10.2 mg/dL (ref 8.9–10.3)
Chloride: 105 mmol/L (ref 98–111)
Creatinine: 0.8 mg/dL (ref 0.44–1.00)
GFR, Estimated: >60 mL/min (ref >60)
Glucose, Bld: 147 mg/dL — ABNORMAL HIGH (ref 70–99)
Potassium: 3.9 mmol/L (ref 3.5–5.1)
Sodium: 140 mmol/L (ref 135–145)
Total Bilirubin: 0.9 mg/dL (ref 0.3–1.2)
Total Protein: 7.1 g/dL (ref 6.5–8.1)

## 2022-04-08 LAB — CBC WITH DIFFERENTIAL (CANCER CENTER ONLY)
Abs Immature Granulocytes: 0.01 10*3/uL (ref 0.00–0.07)
Basophils Absolute: 0 10*3/uL (ref 0.0–0.1)
Basophils Relative: 1 %
Eosinophils Absolute: 0.1 10*3/uL (ref 0.0–0.5)
Eosinophils Relative: 3 %
HCT: 39.3 % (ref 36.0–46.0)
Hemoglobin: 12.8 g/dL (ref 12.0–15.0)
Immature Granulocytes: 0 %
Lymphocytes Relative: 12 %
Lymphs Abs: 0.4 10*3/uL — ABNORMAL LOW (ref 0.7–4.0)
MCH: 29 pg (ref 26.0–34.0)
MCHC: 32.6 g/dL (ref 30.0–36.0)
MCV: 88.9 fL (ref 80.0–100.0)
Monocytes Absolute: 0.3 10*3/uL (ref 0.1–1.0)
Monocytes Relative: 7 %
Neutro Abs: 2.6 10*3/uL (ref 1.7–7.7)
Neutrophils Relative %: 77 %
Platelet Count: 73 10*3/uL — ABNORMAL LOW (ref 150–400)
RBC: 4.42 MIL/uL (ref 3.87–5.11)
RDW: 14.6 % (ref 11.5–15.5)
WBC Count: 3.4 10*3/uL — ABNORMAL LOW (ref 4.0–10.5)
nRBC: 0 % (ref 0.0–0.2)

## 2022-04-08 MED ORDER — FULVESTRANT 250 MG/5ML IM SOSY
500.0000 mg | PREFILLED_SYRINGE | Freq: Once | INTRAMUSCULAR | Status: AC
  Administered 2022-04-08: 500 mg via INTRAMUSCULAR
  Filled 2022-04-08: qty 10

## 2022-04-13 DIAGNOSIS — H02056 Trichiasis without entropian left eye, unspecified eyelid: Secondary | ICD-10-CM | POA: Diagnosis not present

## 2022-04-13 DIAGNOSIS — Z961 Presence of intraocular lens: Secondary | ICD-10-CM | POA: Diagnosis not present

## 2022-04-13 DIAGNOSIS — H3581 Retinal edema: Secondary | ICD-10-CM | POA: Diagnosis not present

## 2022-04-13 DIAGNOSIS — H02053 Trichiasis without entropian right eye, unspecified eyelid: Secondary | ICD-10-CM | POA: Diagnosis not present

## 2022-04-13 DIAGNOSIS — L121 Cicatricial pemphigoid: Secondary | ICD-10-CM | POA: Diagnosis not present

## 2022-04-13 DIAGNOSIS — Z79899 Other long term (current) drug therapy: Secondary | ICD-10-CM | POA: Diagnosis not present

## 2022-04-13 DIAGNOSIS — E119 Type 2 diabetes mellitus without complications: Secondary | ICD-10-CM | POA: Diagnosis not present

## 2022-04-15 DIAGNOSIS — E785 Hyperlipidemia, unspecified: Secondary | ICD-10-CM | POA: Diagnosis not present

## 2022-04-15 DIAGNOSIS — K7460 Unspecified cirrhosis of liver: Secondary | ICD-10-CM | POA: Diagnosis not present

## 2022-04-15 DIAGNOSIS — K7581 Nonalcoholic steatohepatitis (NASH): Secondary | ICD-10-CM | POA: Diagnosis not present

## 2022-04-15 DIAGNOSIS — E039 Hypothyroidism, unspecified: Secondary | ICD-10-CM | POA: Diagnosis not present

## 2022-04-15 DIAGNOSIS — E1140 Type 2 diabetes mellitus with diabetic neuropathy, unspecified: Secondary | ICD-10-CM | POA: Diagnosis not present

## 2022-04-15 DIAGNOSIS — E1165 Type 2 diabetes mellitus with hyperglycemia: Secondary | ICD-10-CM | POA: Diagnosis not present

## 2022-04-15 DIAGNOSIS — I10 Essential (primary) hypertension: Secondary | ICD-10-CM | POA: Diagnosis not present

## 2022-04-15 DIAGNOSIS — E559 Vitamin D deficiency, unspecified: Secondary | ICD-10-CM | POA: Diagnosis not present

## 2022-04-15 LAB — HEMOGLOBIN A1C: Hemoglobin A1C: 5.7

## 2022-04-21 NOTE — Telephone Encounter (Signed)
[name] [name] called in . Needs orders for Power wheelchair . Check previous messages.

## 2022-04-22 ENCOUNTER — Other Ambulatory Visit: Payer: Self-pay

## 2022-04-22 ENCOUNTER — Telehealth: Payer: Self-pay | Admitting: Internal Medicine

## 2022-04-22 MED ORDER — UNABLE TO FIND: 1.0000 | Freq: Every day | 0 refills | Status: DC

## 2022-04-22 NOTE — Telephone Encounter (Signed)
[name] [name] to call me back per betsy

## 2022-04-22 NOTE — Telephone Encounter (Signed)
No answer

## 2022-04-22 NOTE — Telephone Encounter (Signed)
Pt wants powered scooter not power chair    [place] [phone]

## 2022-04-23 ENCOUNTER — Other Ambulatory Visit: Payer: Self-pay

## 2022-04-23 MED ORDER — UNABLE TO FIND: 1.0000 | Freq: Every day | 0 refills | Status: DC

## 2022-04-23 NOTE — Telephone Encounter (Signed)
Faxed to [facility] °

## 2022-04-27 ENCOUNTER — Other Ambulatory Visit: Payer: Self-pay

## 2022-05-02 ENCOUNTER — Other Ambulatory Visit: Payer: Self-pay | Admitting: Internal Medicine

## 2022-05-02 DIAGNOSIS — F3340 Major depressive disorder, recurrent, in remission, unspecified: Secondary | ICD-10-CM

## 2022-05-03 ENCOUNTER — Other Ambulatory Visit: Payer: Self-pay | Admitting: Internal Medicine

## 2022-05-03 DIAGNOSIS — F3340 Major depressive disorder, recurrent, in remission, unspecified: Secondary | ICD-10-CM

## 2022-05-05 ENCOUNTER — Other Ambulatory Visit: Payer: Self-pay

## 2022-05-06 ENCOUNTER — Inpatient Hospital Stay: Payer: Medicare Other | Attending: Hematology and Oncology | Admitting: Hematology and Oncology

## 2022-05-06 ENCOUNTER — Other Ambulatory Visit: Payer: Self-pay

## 2022-05-06 ENCOUNTER — Inpatient Hospital Stay: Payer: Medicare Other

## 2022-05-06 VITALS — BP 155/70 | HR 78 | Temp 97.9°F | Resp 15 | Wt 257.9 lb

## 2022-05-06 DIAGNOSIS — Z170 Estrogen receptor positive status [ER+]: Secondary | ICD-10-CM

## 2022-05-06 DIAGNOSIS — C50411 Malignant neoplasm of upper-outer quadrant of right female breast: Secondary | ICD-10-CM | POA: Insufficient documentation

## 2022-05-06 DIAGNOSIS — Z5111 Encounter for antineoplastic chemotherapy: Secondary | ICD-10-CM | POA: Diagnosis not present

## 2022-05-06 DIAGNOSIS — C50412 Malignant neoplasm of upper-outer quadrant of left female breast: Secondary | ICD-10-CM

## 2022-05-06 LAB — CBC WITH DIFFERENTIAL (CANCER CENTER ONLY)
Abs Immature Granulocytes: 0.01 10*3/uL (ref 0.00–0.07)
Basophils Absolute: 0 10*3/uL (ref 0.0–0.1)
Basophils Relative: 0 %
Eosinophils Absolute: 0.1 10*3/uL (ref 0.0–0.5)
Eosinophils Relative: 3 %
HCT: 38.2 % (ref 36.0–46.0)
Hemoglobin: 12.2 g/dL (ref 12.0–15.0)
Immature Granulocytes: 0 %
Lymphocytes Relative: 17 %
Lymphs Abs: 0.4 10*3/uL — ABNORMAL LOW (ref 0.7–4.0)
MCH: 28.8 pg (ref 26.0–34.0)
MCHC: 31.9 g/dL (ref 30.0–36.0)
MCV: 90.1 fL (ref 80.0–100.0)
Monocytes Absolute: 0.3 10*3/uL (ref 0.1–1.0)
Monocytes Relative: 11 %
Neutro Abs: 1.6 10*3/uL — ABNORMAL LOW (ref 1.7–7.7)
Neutrophils Relative %: 69 %
Platelet Count: 63 10*3/uL — ABNORMAL LOW (ref 150–400)
RBC: 4.24 MIL/uL (ref 3.87–5.11)
RDW: 14.7 % (ref 11.5–15.5)
WBC Count: 2.4 10*3/uL — ABNORMAL LOW (ref 4.0–10.5)
nRBC: 0 % (ref 0.0–0.2)

## 2022-05-06 LAB — CMP (CANCER CENTER ONLY)
ALT: 14 U/L (ref 0–44)
AST: 26 U/L (ref 15–41)
Albumin: 3.7 g/dL (ref 3.5–5.0)
Alkaline Phosphatase: 94 U/L (ref 38–126)
Anion gap: 7 (ref 5–15)
BUN: 14 mg/dL (ref 8–23)
CO2: 26 mmol/L (ref 22–32)
Calcium: 10.1 mg/dL (ref 8.9–10.3)
Chloride: 107 mmol/L (ref 98–111)
Creatinine: 0.85 mg/dL (ref 0.44–1.00)
GFR, Estimated: >60 mL/min (ref >60)
Glucose, Bld: 182 mg/dL — ABNORMAL HIGH (ref 70–99)
Potassium: 3.6 mmol/L (ref 3.5–5.1)
Sodium: 140 mmol/L (ref 135–145)
Total Bilirubin: 0.7 mg/dL (ref 0.3–1.2)
Total Protein: 6.6 g/dL (ref 6.5–8.1)

## 2022-05-06 MED ORDER — FULVESTRANT 250 MG/5ML IM SOSY
500.0000 mg | PREFILLED_SYRINGE | Freq: Once | INTRAMUSCULAR | Status: AC
  Administered 2022-05-06: 500 mg via INTRAMUSCULAR
  Filled 2022-05-06: qty 10

## 2022-05-06 NOTE — Progress Notes (Signed)
[place]  [phone] [phone]) [phone]     ID: [name] [name] DOB: 1950-02-26  MR#: [id number]  [id number]  Patient Care Team: [name], MD as PCP - General (Internal Medicine) [name], RN as Oncology Nurse Navigator [name], RN as Oncology Nurse Navigator [name], MD as Consulting Physician (Hematology and Oncology) Neldon Labella, RN as Case Manager Gery Pray, MD as Consulting Physician (Radiation Oncology) Coralie Keens, MD as Consulting Physician (General Surgery) OTHER MD:  CHIEF COMPLAINT: Estrogen receptor positive invasive breast cancer  CURRENT TREATMENT: anastrozole  BREAST CANCER HISTORY:  Oncology History  Malignant neoplasm of upper-outer quadrant of left breast in female, estrogen receptor positive  02/02/2015 - 02/2021 Anti-estrogen oral therapy   Anastrozole daily, completed 6 years   08/04/2015 Surgery   73 y.o. [name] [name] woman status post left breast upper outer quadrant lumpectomy 08/04/2015 for a pT1c pNX, stage I A invasive ductal carcinoma, estrogen and progesterone receptor positive, HER-2 not amplified, with no HER-2 amplification, anterior margin was focally positive   08/04/2015 Oncotype testing   Oncotype DX score of 12 predicts a 10 year risk of recurrence outside the breast of 8% if the patient's only systemic therapy is tamoxifen for 5 years. It also predicts no benefit from therapy.      09/12/2015 Surgery   additional Left breast surgery 09/12/2015 cleared the margins and found all 5 axillary lymph nodes sampled clear for a final stage pT1c pN0, stage IA   10/22/2015 - 12/08/2015 Radiation Therapy   Adjuvant radiation 10/22/15 - 12/08/15              1) Left breast: 50.4 Gy in 28 fractions              2) Left breast boost: 10 Gy in 5 fractions   10/26/2021 Cancer Staging   Staging form: Breast, AJCC 7th Edition - Pathologic: Stage IA (T1c, N0, cM0) - Signed by Gardenia Phlegm, NP on 10/26/2021   Malignant neoplasm of right breast  06/10/2021 Mammogram   asymmetry within the OUTER RIGHT breast on CC views warrants further evaluation.   Targeted ultrasound : showing hypoechoic oval mass with indistinct margins in the 10:30 o'clock location of the RIGHT breast 9 centimeters from nipple measuring 0.5 x 0.4 x 0.4 centimeters. This area is approximately 1.8 centimeters from the palpable bruise, bruise was thought to be fat necrosis.   06/23/2021 Initial Diagnosis   Right breast needle core biopsy IDC, grade one, ER 90% positive, PR 60% positive, Ki67 2%, HER-2 IHC 2+, FISH negative   07/08/2021 Treatment Plan Change   Faslodex every 4 weeks, Abemaciclib to be added    08/12/2021 Surgery   Right lumpectomy: invasive ductal carcinoma with clear margins of resection, final pathology showed tumor measuring 1.1 cm x 0.7 x 8.7,  grade 3   previous biopsy showed ER 90% positive, PR 60% positive, Ki-67 was 2% HER2 2+ by IHC and negative by [facility]   08/12/2021 Oncotype testing   12   09/22/2021 - 10/30/2021 Radiation Therapy   Site Technique Total Dose (Gy) Dose per Fx (Gy) Completed Fx Beam Energies  Breast, Right: Breast_R 3D 45/45 1.8 25/25 15X, 10XFFF  Breast, Right: Breast_R_Bst 3D 6/6 2 3/3 10X, 15X     Interval History  Patient is here for follow-up.   DM is very well controlled on ozempic. She is still dealing with sinus issues, nose bleeds. Since last visit, she is tolerating  faslodex well except for injection site pain.  She has noticed some tenderness in the left breast, husband thinks because she has to pull herself up to get into the truck. She otherwise is doing well, has some ongoing family stuff, had to bury her sister yesterday, she died of an infection at the age of 98. Rest of the pertinent 10 point ROS reviewed and negative  PAST MEDICAL HISTORY: Past Medical History:  Diagnosis Date   Abdominal wall contusion 12/09/2011   Achilles tendon  contracture, bilateral 06/03/2016   Anxiety    Arthritis    Blood dyscrasia    low platelet count- sees [name] , [name] ( Note in [place] from 07/2014) at [facility]) 2017   Left Breast   Breast cancer ([place]) 07/2021   right breast IDC   Cancer ([place])    left breast   Chest wall contusion 12/09/2011   Cirrhosis of liver ([place])    Cough    Diabetes mellitus without complication ([place])    Diabetic neuropathy ([place])    Diarrhea due to drug 01/29/2021   Dyspnea    Gout    Gout    History of radiation therapy 10/22/15 - 12/08/15   left breast 50.4 Gy, boost to 10 Gy   History of radiation therapy    Right breast- 09/22/21-10/30/21- Dr. Gery Pray   Hypertension    Hypothyroidism    Migraine    Multifactorial gait disorder 01/12/2013   Neuromuscular disorder ([place])    diabetic neuropathy   Neuropathy    Personal history of radiation therapy 2017   Left Breast Cancer   Posterior tibial tendon dysfunction (PTTD) of right lower extremity 10/09/2019   Sleep apnea    CPAP nightly   Spleen enlarged    Thyroid disease     PAST SURGICAL HISTORY: Past Surgical History:  Procedure Laterality Date   ANKLE FUSION Right 2022   BREAST BIOPSY Left 08/04/2015   Procedure: LEFT BREAST BIOPSY WITH NEEDLE LOCALIZATION;  Surgeon: [name], MD;  Location: [facility];  Service: General;  Laterality: Left;   BREAST BIOPSY Right 06/23/2021   BREAST DUCTAL SYSTEM EXCISION Left 08/04/2015   Procedure: LEFT EXCISION DUCTAL SYSTEM BREAST;  Surgeon: [name], MD;  Location: [facility];  Service: General;  Laterality: Left;   BREAST LUMPECTOMY Left 08/2015   BREAST LUMPECTOMY WITH AXILLARY LYMPH NODE BIOPSY Left 09/12/2015   Procedure: RE-EXCISION OF LEFT BREAST LUMPECTOMY WITH LEFT AXILLARY LYMPH NODE BIOPSY;  Surgeon: [name], MD;  Location: [facility];  Service: General;  Laterality: Left;   BREAST  LUMPECTOMY WITH RADIOACTIVE SEED LOCALIZATION Right 08/12/2021   Procedure: RIGHT BREAST LUMPECTOMY WITH RADIOACTIVE SEED LOCALIZATION;  Surgeon: Coralie Keens, MD;  Location: [place];  Service: General;  Laterality: Right;  LMA   CHOLECYSTECTOMY     EYE SURGERY Bilateral 2022   Lashes growing backwards into the eye   JOINT REPLACEMENT     left knee   JOINT REPLACEMENT  2017   right knee   REVERSE SHOULDER ARTHROPLASTY Right 09/07/2018   Procedure: RIGHT REVERSE SHOULDER ARTHROPLASTY;  Surgeon: Meredith Pel, MD;  Location: [facility];  Service: Orthopedics;  Laterality: Right;   TOTAL KNEE ARTHROPLASTY Right 02/28/2015   Procedure: RIGHT TOTAL KNEE ARTHROPLASTY;  Surgeon: [name], MD;  Location: [facility];  Service: Orthopedics;  Laterality: Right;   TUBAL LIGATION  FAMILY HISTORY Family History  Problem Relation Age of Onset   Hypothyroidism Mother    Alzheimer's disease Mother    Diabetes Father    Cancer Sister        GYN cancer   Hypothyroidism Sister    Heart disease Brother    Arthritis Son    Diabetes Son    Arthritis Son    Cancer Maternal Grandmother    Heart failure Maternal Grandfather    Bone cancer Paternal Grandmother    Heart failure Paternal Grandmother   The patient's father died from an encephalitis at the age of 22. The patient's mother is living at age 39. The patient had one brother, 3 sisters. A paternal grandf mother had "bone cancer". A paternal aunt, present today also had "bone cancer", but on further questioning this was myeloma. The patient's sister [name] was diagnosed with ovarian and uterine cancer and has been genetically tested but the test was negative   GYNECOLOGIC HISTORY:  No LMP recorded. Patient is postmenopausal. Menarche age 37 and first live birth age 20, menopause in her 55s. The patient never took hormone replacement or oral contraceptives.   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 Her husband [name] is a Games developer. At home is just the 2 of them, the patient's aunt who is under hospice care for her myeloma, and the patient's dog. Son [name] lives in [place] and works for an Scientist, research (medical).Pandora Leiter Jeneen Rinks to be also lives in [place] and is a Tax inspector. Daughter Alinda Money lives in [place] and works for the [employer] school system as F Careers information officer. The patient has 5 grandchildren. She attends a [employer].    ADVANCED DIRECTIVES: In the absence of any documentation to the contrary, the patient's spouse is their HCPOA.    HEALTH MAINTENANCE: Social History   Tobacco Use   Smoking status: Never   Smokeless tobacco: Never  Vaping Use   Vaping Use: Never used  Substance Use Topics   Alcohol use: No   Drug use: No     Colonoscopy: 2015/Buccini  PAP:  Bone density:   Allergies  Allergen Reactions   Codeine Swelling   Influenza A (H1n1) Monoval Pf Anaphylaxis   Hydrocodone-Acetaminophen Other (See Comments)   Lisinopril Other (See Comments)   Metformin Other (See Comments)   Pregabalin Other (See Comments)   Jardiance [Empagliflozin] Other (See Comments)    dehydration    Current Outpatient Medications  Medication Sig Dispense Refill   ACCU-CHEK AVIVA PLUS test strip USE AS INSTRUCTED TO CHECK BLOOD SUGAR 2 TIMES DAILY (Patient not taking: Reported on 02/03/2022) 100 strip 5   acetaminophen (TYLENOL) 500 MG tablet Take by mouth.     albuterol (VENTOLIN HFA) 108 (90 Base) MCG/ACT inhaler Inhale 1-2 puffs into the lungs every 4 (four) hours as needed for wheezing or shortness of breath. 18 g 2   Ascorbic Acid (VITAMIN C PO) Take by mouth daily.     buPROPion (WELLBUTRIN XL) 150 MG 24 hr tablet TAKE 1 TABLET BY MOUTH EVERY DAY IN THE MORNING 90 tablet 1   citalopram (CELEXA) 40 MG tablet TAKE 1 TABLET BY MOUTH EVERY DAY 90 tablet 1   Cyanocobalamin  (VITAMIN B-12 PO) Take by mouth daily.     FARXIGA 10 MG TABS tablet TAKE 1 TABLET BY MOUTH DAILY BEFORE BREAKFAST. 90 tablet 3   fluticasone (FLONASE) 50  MCG/ACT nasal spray Place 1 spray into both nostrils as needed.      gabapentin (NEURONTIN) 600 MG tablet TAKE 1 TABLET IN THE MORNING AND 1 TABLET IN THE AFTERNOON AND 2TABS AT BEDTIME     insulin aspart (FIASP FLEXTOUCH) 100 UNIT/ML FlexTouch Pen Max daily 60 units (Patient taking differently: Max daily 62 units) 60 mL 3   insulin aspart (NOVOLOG FLEXPEN) 100 UNIT/ML FlexPen 20 Units. (Patient not taking: Reported on 02/03/2022)     Insulin Disposable Pump (OMNIPOD 5 G6 INTRO, GEN 5,) KIT 1 Device by Does not apply route every other day. 1 kit 0   Insulin Disposable Pump (OMNIPOD 5 G6 POD, GEN 5,) MISC 1 Device by Does not apply route every other day. 30 each 3   insulin glargine (LANTUS SOLOSTAR) 100 UNIT/ML Solostar Pen Inject 62 Units into the skin daily. 60 mL 4   Insulin Pen Needle (B-D UF III MINI PEN NEEDLES) 31G X 5 MM MISC USE AS DIRECTED WITH INSULIN EVERY MORNING, NOON, EVENING AND AT BEDTIME 400 each 3   Insulin Pen Needle 30G X 5 MM MISC 1 Device by Does not apply route in the morning, at noon, in the evening, and at bedtime. 400 each 3   ketoconazole (NIZORAL) 2 % cream Apply 1 Application topically daily. 60 g 0   levothyroxine (SYNTHROID) 150 MCG tablet Take 1 tablet (150 mcg total) by mouth daily. 90 tablet 3   losartan (COZAAR) 25 MG tablet Take 1 tablet (25 mg total) by mouth daily. 90 tablet 1   methocarbamol (ROBAXIN) 500 MG tablet Take 1 tablet (500 mg total) by mouth every 8 (eight) hours as needed for muscle spasms. 30 tablet 0   Multiple Vitamins-Minerals (MULTIVITAMIN WITH MINERALS) tablet Take 1 tablet by mouth daily.     mycophenolate (CELLCEPT) 500 MG tablet Take 1,500 mg by mouth 2 (two) times daily.     promethazine-dextromethorphan (PROMETHAZINE-DM) 6.25-15 MG/5ML syrup Take 5 mLs by mouth 4 (four) times daily as  needed for cough. 180 mL 0   Pyridoxine HCl (VITAMIN B-6 PO) Take by mouth daily.     Semaglutide, 1 MG/DOSE, (OZEMPIC, 1 MG/DOSE,) 4 MG/3ML SOPN Inject 1 mg into the skin once a week. 3 mL 5   UNABLE TO FIND 1 each by Does not apply route daily. Med Name: POWER WHEELCHAIR  DX CODE-R26.89 1 each 0   UNABLE TO FIND 1 each by Does not apply route daily. Med Name: POWER SCOOTER DX CODE-R26.89 1 each 0   VITAMIN D PO Take by mouth daily.     No current facility-administered medications for this visit.    OBJECTIVE: White woman examined in a wheelchair  Vitals:   05/06/22 0844  BP: (!) 155/70  Pulse: 78  Resp: 15  Temp: 97.9 F (36.6 C)  SpO2: 94%     Wt Readings from Last 3 Encounters:  05/06/22 257 lb 14.4 oz (117 kg)  02/11/22 257 lb 1.6 oz (116.6 kg)  02/03/22 261 lb (118.4 kg)   Body mass index is 48.73 kg/m.    ECOG FS:2 - Symptomatic, <50% confined to bed  Physical Exam Constitutional:      Appearance: Normal appearance.     Comments:    Pulmonary:     Comments: . Chest:       Comments: Some tenderness and swelling in the left breast in the upper area, likely inflamed muscle. No definitive lump. Musculoskeletal:     Cervical back: Normal  range of motion and neck supple. No rigidity.     Comments: She is in a wheelchair today  Lymphadenopathy:     Cervical: No cervical adenopathy.  Neurological:     Mental Status: She is alert.      LAB RESULTS:  CMP     Component Value Date/Time   NA 140 05/06/2022 0804   NA 138 07/05/2018 1635   NA 142 10/06/2016 1211   K 3.6 05/06/2022 0804   K 3.8 10/06/2016 1211   CL 107 05/06/2022 0804   CO2 26 05/06/2022 0804   CO2 25 10/06/2016 1211   GLUCOSE 182 (H) 05/06/2022 0804   GLUCOSE 179 (H) 10/06/2016 1211   BUN 14 05/06/2022 0804   BUN 19 07/05/2018 1635   BUN 16.9 10/06/2016 1211   CREATININE 0.85 05/06/2022 0804   CREATININE 1.09 (H) 09/28/2017 1516   CREATININE 1.1 10/06/2016 1211   CALCIUM 10.1  05/06/2022 0804   CALCIUM 10.4 10/06/2016 1211   PROT 6.6 05/06/2022 0804   PROT 7.5 07/05/2018 1635   PROT 7.6 10/06/2016 1211   ALBUMIN 3.7 05/06/2022 0804   ALBUMIN 4.6 07/05/2018 1635   ALBUMIN 3.9 10/06/2016 1211   AST 26 05/06/2022 0804   AST 33 10/06/2016 1211   ALT 14 05/06/2022 0804   ALT 20 10/06/2016 1211   ALKPHOS 94 05/06/2022 0804   ALKPHOS 86 10/06/2016 1211   BILITOT 0.7 05/06/2022 0804   BILITOT 0.52 10/06/2016 1211   GFRNONAA >60 05/06/2022 0804   GFRNONAA 52 (L) 09/28/2017 1516   GFRAA >60 11/07/2018 1133   GFRAA 61 09/28/2017 1516    INo results found for: "SPEP", "UPEP"  Lab Results  Component Value Date   WBC 2.4 (L) 05/06/2022   NEUTROABS 1.6 (L) 05/06/2022   HGB 12.2 05/06/2022   HCT 38.2 05/06/2022   MCV 90.1 05/06/2022   PLT 63 (L) 05/06/2022      Chemistry      Component Value Date/Time   NA 140 05/06/2022 0804   NA 138 07/05/2018 1635   NA 142 10/06/2016 1211   K 3.6 05/06/2022 0804   K 3.8 10/06/2016 1211   CL 107 05/06/2022 0804   CO2 26 05/06/2022 0804   CO2 25 10/06/2016 1211   BUN 14 05/06/2022 0804   BUN 19 07/05/2018 1635   BUN 16.9 10/06/2016 1211   CREATININE 0.85 05/06/2022 0804   CREATININE 1.09 (H) 09/28/2017 1516   CREATININE 1.1 10/06/2016 1211      Component Value Date/Time   CALCIUM 10.1 05/06/2022 0804   CALCIUM 10.4 10/06/2016 1211   ALKPHOS 94 05/06/2022 0804   ALKPHOS 86 10/06/2016 1211   AST 26 05/06/2022 0804   AST 33 10/06/2016 1211   ALT 14 05/06/2022 0804   ALT 20 10/06/2016 1211   BILITOT 0.7 05/06/2022 0804   BILITOT 0.52 10/06/2016 1211       No results found for: "LABCA2"  No components found for: "LABCA125"  No results for input(s): "INR" in the last 168 hours.  Urinalysis    Component Value Date/Time   COLORURINE YELLOW 08/30/2018 1357   APPEARANCEUR CLEAR 08/30/2018 1357   LABSPEC 1.024 08/30/2018 1357   PHURINE 5.0 08/30/2018 1357   GLUCOSEU >=500 (A) 08/30/2018 1357   HGBUR  NEGATIVE 08/30/2018 [place] 08/30/2018 1357   KETONESUR NEGATIVE 08/30/2018 1357   PROTEINUR NEGATIVE 08/30/2018 1357   UROBILINOGEN 0.2 12/09/2011 1907   NITRITE NEGATIVE 08/30/2018 1357   LEUKOCYTESUR  NEGATIVE 08/30/2018 1357    STUDIES: No results found.   ELIGIBLE FOR AVAILABLE RESEARCH PROTOCOL: no  ASSESSMENT: 72 y.o. [name] status post left breast upper outer quadrant lumpectomy 08/04/2015 for a pT1c pNX, stage I A invasive ductal carcinoma, estrogen and progesterone receptor positive, HER-2 not amplified, with no HER-2 amplification.  (a) anterior margin was focally positive  (1) additional Left breast surgery 09/12/2015 cleared the margins and found all 5 axillary lymph nodes sampled clear for a final stage pT1c pN0, stage IA  (2) Oncotype DX score of 12 predicts a 10 year risk of recurrence outside the breast of 8% if the patient's only systemic therapy is tamoxifen for 5 years. It also predicts no benefit from therapy.   (3) Adjuvant radiation 10/22/15 - 12/08/15   1) Left breast: 50.4 Gy in 28 fractions              2) Left breast boost: 10 Gy in 5 fractions  (4) started  anastrozole 02/02/2015, completed 6 yrs.  (5) She has mammogram recently 06/20/2021, possible asymmetry within the OUTER RIGHT breast on CC views warrants further evaluation. Targeted ultrasound is performed, showing hypoechoic oval mass with indistinct margins in the 10:30 o'clock location of the RIGHT breast 9 centimeters from nipple measuring 0.5 x 0.4 x 0.4 centimeters. This area is approximately 1.8 centimeters from the palpable bruise, bruise was thought to be fat necrosis.  Right breast lumpectomy showed invasive ductal carcinoma with clear margins of resection, final pathology showed tumor measuring 1.1 cm x 0.7 x 8.7 grade 3 previous biopsy showed ER 90% positive, PR 60% positive, Ki-67 was 2% HER2 2+ by IHC and negative by FISH  PLAN:  She is here for  follow-up.  She is currently on adjuvant Faslodex.  She is doing quite well with the injections except for soreness at the site of injection.  In the past we have discussed about considering abemaciclib but given her multiple comorbidities, borderline performance status, we wont move forward with it.   We have also discussed about alternate of antiestrogen therapy with tamoxifen but once again she is very sedentary hence she [place]an have high risk of blood clots.  She is comfortable with the injections and will continue Faslodex as recommended for 5 years.    She will repeat mammogram in July 2024.  With regards to her recurrent cough, sinus infections, she will follow up with ENT  She will return to clinic in about 3 months for follow-up or sooner as needed.  I will call her in a couple weeks to follow-up on the tenderness in the left chest wall. Faslodex every 28 days.  No other concerning review of systems or physical examination findings.  Total time spent 30 minutes   *Total Encounter Time as defined by the Centers for Medicare and Medicaid Services includes, in addition to the face-to-face time of a patient visit (documented in the note above) non-face-to-face time: obtaining and reviewing outside history, ordering and reviewing medications, tests or procedures, care coordination (communications with other health care professionals or caregivers) and documentation in the medical record.

## 2022-05-10 ENCOUNTER — Telehealth: Payer: Self-pay | Admitting: Nutrition

## 2022-05-10 NOTE — Telephone Encounter (Signed)
LVM to call me concerning insulin pump/OmniPod

## 2022-05-18 ENCOUNTER — Inpatient Hospital Stay (HOSPITAL_BASED_OUTPATIENT_CLINIC_OR_DEPARTMENT_OTHER): Payer: Medicare Other | Admitting: Hematology and Oncology

## 2022-05-18 ENCOUNTER — Telehealth: Payer: Self-pay

## 2022-05-18 ENCOUNTER — Telehealth: Payer: Self-pay | Admitting: Internal Medicine

## 2022-05-18 ENCOUNTER — Encounter: Payer: Self-pay | Admitting: Hematology and Oncology

## 2022-05-18 DIAGNOSIS — C50412 Malignant neoplasm of upper-outer quadrant of left female breast: Secondary | ICD-10-CM

## 2022-05-18 DIAGNOSIS — Z170 Estrogen receptor positive status [ER+]: Secondary | ICD-10-CM | POA: Diagnosis not present

## 2022-05-18 DIAGNOSIS — Z79818 Long term (current) use of other agents affecting estrogen receptors and estrogen levels: Secondary | ICD-10-CM | POA: Diagnosis not present

## 2022-05-18 NOTE — Telephone Encounter (Signed)
Left Message - Patient was scheduled on 05/17/2022 for 05/19/2022 stating she was coughing up blood and having chest pains. I attempted to call patient to see how she was doing since the appointment was two days after she called and had to leave voicemail

## 2022-05-18 NOTE — Progress Notes (Addendum)
[facility]  [phone] [phone]     ID: [name] [name] DOB: September 27, 1950  MR#: [id number]  YYQ#:[id number]  Patient Care Team: Anabel Halon, MD as PCP - General (Internal Medicine) Donnelly Angelica, RN as Oncology Nurse Navigator Pershing Proud, RN as Oncology Nurse Navigator Rachel Moulds, MD as Consulting Physician (Hematology and Oncology) Juanell Fairly, RN as Case Manager Antony Blackbird, MD as Consulting Physician (Radiation Oncology) Abigail Miyamoto, MD as Consulting Physician (General Surgery) OTHER MD:  CHIEF COMPLAINT: Estrogen receptor positive invasive breast cancer  CURRENT TREATMENT: anastrozole  BREAST CANCER HISTORY:  Oncology History  Malignant neoplasm of upper-outer quadrant of left breast in female, estrogen receptor positive  02/02/2015 - 02/2021 Anti-estrogen oral therapy   Anastrozole daily, completed 6 years   08/04/2015 Surgery   72 y.o. [name] [name] woman status post left breast upper outer quadrant lumpectomy 08/04/2015 for a pT1c pNX, stage I A invasive ductal carcinoma, estrogen and progesterone receptor positive, HER-2 not amplified, with no HER-2 amplification, anterior margin was focally positive   08/04/2015 Oncotype testing   Oncotype DX score of 12 predicts a 10 year risk of recurrence outside the breast of 8% if the patient's only systemic therapy is tamoxifen for 5 years. It also predicts no benefit from therapy.      09/12/2015 Surgery   additional Left breast surgery 09/12/2015 cleared the margins and found all 5 axillary lymph nodes sampled clear for a final stage pT1c pN0, stage IA   10/22/2015 - 12/08/2015 Radiation Therapy   Adjuvant radiation 10/22/15 - 12/08/15              1) Left breast: 50.4 Gy in 28 fractions              2) Left breast boost: 10 Gy in 5 fractions   10/26/2021 Cancer Staging   Staging form: Breast, AJCC 7th Edition - Pathologic: Stage IA (T1c, N0, cM0) - Signed by Loa Socks, NP on 10/26/2021   Malignant neoplasm of right breast  06/10/2021 Mammogram   asymmetry within the OUTER RIGHT breast on CC views warrants further evaluation.   Targeted ultrasound : showing hypoechoic oval mass with indistinct margins in the 10:30 o'clock location of the RIGHT breast 9 centimeters from nipple measuring 0.5 x 0.4 x 0.4 centimeters. This area is approximately 1.8 centimeters from the palpable bruise, bruise was thought to be fat necrosis.   06/23/2021 Initial Diagnosis   Right breast needle core biopsy IDC, grade one, ER 90% positive, PR 60% positive, Ki67 2%, HER-2 IHC 2+, FISH negative   07/08/2021 Treatment Plan Change   Faslodex every 4 weeks, Abemaciclib to be added    08/12/2021 Surgery   Right lumpectomy: invasive ductal carcinoma with clear margins of resection, final pathology showed tumor measuring 1.1 cm x 0.7 x 8.7,  grade 3   previous biopsy showed ER 90% positive, PR 60% positive, Ki-67 was 2% HER2 2+ by IHC and negative by [facility]   08/12/2021 Oncotype testing   12   09/22/2021 - 10/30/2021 Radiation Therapy   Site Technique Total Dose (Gy) Dose per Fx (Gy) Completed Fx Beam Energies  Breast, Right: Breast_R 3D 45/45 1.8 25/25 15X, 10XFFF  Breast, Right: Breast_R_Bst 3D 6/6 2 3/3 10X, 15X     Interval History  Patient is here for follow-up.   She continues to have pain in the left chest wall.  She also has been having a lot of cough and wonders  if this cough is causing the left chest wall pain.  She also has some ongoing sinus infections. Rest of the pertinent 10 point ROS reviewed and negative  PAST MEDICAL HISTORY: Past Medical History:  Diagnosis Date   Abdominal wall contusion 12/09/2011   Achilles tendon contracture, bilateral 06/03/2016   Anxiety    Arthritis    Blood dyscrasia    low platelet count- sees [name] , Dr. Nelva Nay ( Note in EPIC from 07/2014) at [facility]   Breast cancer [facility]) 2017   Left Breast    Breast cancer (HCC) 07/2021   right breast IDC   Cancer (HCC)    left breast   Chest wall contusion 12/09/2011   Cirrhosis of liver (HCC)    Cough    Diabetes mellitus without complication (HCC)    Diabetic neuropathy (HCC)    Diarrhea due to drug 01/29/2021   Dyspnea    Gout    Gout    History of radiation therapy 10/22/15 - 12/08/15   left breast 50.4 Gy, boost to 10 Gy   History of radiation therapy    Right breast- 09/22/21-10/30/21- Dr. 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   Neuromuscular disorder (HCC)    diabetic neuropathy   Neuropathy    Personal history of radiation therapy 2017   Left Breast Cancer   Posterior tibial tendon dysfunction (PTTD) of right lower extremity 10/09/2019   Sleep apnea    CPAP nightly   Spleen enlarged    Thyroid disease     PAST SURGICAL HISTORY: Past Surgical History:  Procedure Laterality Date   ANKLE FUSION Right 2022   BREAST BIOPSY Left 08/04/2015   Procedure: LEFT BREAST BIOPSY WITH NEEDLE LOCALIZATION;  Surgeon: Darnell Level, MD;  Location: [facility];  Service: General;  Laterality: Left;   BREAST BIOPSY Right 06/23/2021   BREAST DUCTAL SYSTEM EXCISION Left 08/04/2015   Procedure: LEFT EXCISION DUCTAL SYSTEM BREAST;  Surgeon: Darnell Level, MD;  Location: [facility];  Service: General;  Laterality: Left;   BREAST LUMPECTOMY Left 08/2015   BREAST LUMPECTOMY WITH AXILLARY LYMPH NODE BIOPSY Left 09/12/2015   Procedure: RE-EXCISION OF LEFT BREAST LUMPECTOMY WITH LEFT AXILLARY LYMPH NODE BIOPSY;  Surgeon: Darnell Level, MD;  Location: [facility];  Service: General;  Laterality: Left;   BREAST LUMPECTOMY WITH RADIOACTIVE SEED LOCALIZATION Right 08/12/2021   Procedure: RIGHT BREAST LUMPECTOMY WITH RADIOACTIVE SEED LOCALIZATION;  Surgeon: Abigail Miyamoto, MD;  Location: [facility];  Service: General;  Laterality: Right;   LMA   CHOLECYSTECTOMY     EYE SURGERY Bilateral 2022   Lashes growing backwards into the eye   JOINT REPLACEMENT     left knee   JOINT REPLACEMENT  2017   right knee   REVERSE SHOULDER ARTHROPLASTY Right 09/07/2018   Procedure: RIGHT REVERSE SHOULDER ARTHROPLASTY;  Surgeon: Cammy Copa, MD;  Location: [facility];  Service: Orthopedics;  Laterality: Right;   TOTAL KNEE ARTHROPLASTY Right 02/28/2015   Procedure: RIGHT TOTAL KNEE ARTHROPLASTY;  Surgeon: Kathryne Hitch, MD;  Location: [facility];  Service: Orthopedics;  Laterality: Right;   TUBAL LIGATION      FAMILY HISTORY Family History  Problem Relation Age of Onset   Hypothyroidism Mother    Alzheimer's disease Mother    Diabetes Father    Cancer Sister        GYN cancer  Hypothyroidism Sister    Heart disease Brother    Arthritis Son    Diabetes Son    Arthritis Son    Cancer Maternal Grandmother    Heart failure Maternal Grandfather    Bone cancer Paternal Grandmother    Heart failure Paternal Grandmother   The patient's father died from an encephalitis at the age of 76. The patient's mother is living at age 4. The patient had one brother, 3 sisters. A paternal grandf mother had "bone cancer". A paternal aunt, present today also had "bone cancer", but on further questioning this was myeloma. The patient's sister Kendal Hymen was diagnosed with ovarian and uterine cancer and has been genetically tested but the test was negative   GYNECOLOGIC HISTORY:  No LMP recorded. Patient is postmenopausal. Menarche age 32 and first live birth age 51, menopause in her 81s. The patient never took hormone replacement or oral contraceptives.   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 Her husband [name] is a Music therapist. At home is just the 2 of them, the patient's aunt who is under hospice care for her myeloma, and the patient's dog. Son [name] Junior lives in  [place] and works for an Oceanographer.Shari Heritage Fayrene Fearing to be also lives in [place] and is a Electronics engineer. Daughter Marda Stalker lives in [place] and works for the [employer] school system as F Estate manager/land agent. The patient has 5 grandchildren. She attends a [employer].    ADVANCED DIRECTIVES: In the absence of any documentation to the contrary, the patient's spouse is their HCPOA.    HEALTH MAINTENANCE: Social History   Tobacco Use   Smoking status: Never   Smokeless tobacco: Never  Vaping Use   Vaping Use: Never used  Substance Use Topics   Alcohol use: No   Drug use: No     Colonoscopy: 2015/Buccini  PAP:  Bone density:   Allergies  Allergen Reactions   Codeine Swelling   Influenza A (H1n1) Monoval Pf Anaphylaxis   Hydrocodone-Acetaminophen Other (See Comments)   Lisinopril Other (See Comments)   Metformin Other (See Comments)   Pregabalin Other (See Comments)   Jardiance [Empagliflozin] Other (See Comments)    dehydration    Current Outpatient Medications  Medication Sig Dispense Refill   ACCU-CHEK AVIVA PLUS test strip USE AS INSTRUCTED TO CHECK BLOOD SUGAR 2 TIMES DAILY (Patient not taking: Reported on 02/03/2022) 100 strip 5   acetaminophen (TYLENOL) 500 MG tablet Take by mouth.     albuterol (VENTOLIN HFA) 108 (90 Base) MCG/ACT inhaler Inhale 1-2 puffs into the lungs every 4 (four) hours as needed for wheezing or shortness of breath. 18 g 2   Ascorbic Acid (VITAMIN C PO) Take by mouth daily.     buPROPion (WELLBUTRIN XL) 150 MG 24 hr tablet TAKE 1 TABLET BY MOUTH EVERY DAY IN THE MORNING 90 tablet 1   citalopram (CELEXA) 40 MG tablet TAKE 1 TABLET BY MOUTH EVERY DAY 90 tablet 1   Cyanocobalamin (VITAMIN B-12 PO) Take by mouth daily.     FARXIGA 10 MG TABS tablet TAKE 1 TABLET BY MOUTH DAILY BEFORE BREAKFAST. 90 tablet 3   fluticasone (FLONASE) 50 MCG/ACT nasal spray Place 1 spray into both nostrils as needed.      gabapentin  (NEURONTIN) 600 MG tablet TAKE 1 TABLET IN THE MORNING AND 1 TABLET IN THE AFTERNOON AND 2TABS AT BEDTIME  insulin aspart (FIASP FLEXTOUCH) 100 UNIT/ML FlexTouch Pen Max daily 60 units (Patient taking differently: Max daily 62 units) 60 mL 3   insulin aspart (NOVOLOG FLEXPEN) 100 UNIT/ML FlexPen 20 Units. (Patient not taking: Reported on 02/03/2022)     Insulin Disposable Pump (OMNIPOD 5 G6 INTRO, GEN 5,) KIT 1 Device by Does not apply route every other day. 1 kit 0   Insulin Disposable Pump (OMNIPOD 5 G6 POD, GEN 5,) MISC 1 Device by Does not apply route every other day. 30 each 3   insulin glargine (LANTUS SOLOSTAR) 100 UNIT/ML Solostar Pen Inject 62 Units into the skin daily. 60 mL 4   Insulin Pen Needle (B-D UF III MINI PEN NEEDLES) 31G X 5 MM MISC USE AS DIRECTED WITH INSULIN EVERY MORNING, NOON, EVENING AND AT BEDTIME 400 each 3   Insulin Pen Needle 30G X 5 MM MISC 1 Device by Does not apply route in the morning, at noon, in the evening, and at bedtime. 400 each 3   ketoconazole (NIZORAL) 2 % cream Apply 1 Application topically daily. 60 g 0   levothyroxine (SYNTHROID) 150 MCG tablet Take 1 tablet (150 mcg total) by mouth daily. 90 tablet 3   losartan (COZAAR) 25 MG tablet Take 1 tablet (25 mg total) by mouth daily. 90 tablet 1   methocarbamol (ROBAXIN) 500 MG tablet Take 1 tablet (500 mg total) by mouth every 8 (eight) hours as needed for muscle spasms. 30 tablet 0   Multiple Vitamins-Minerals (MULTIVITAMIN WITH MINERALS) tablet Take 1 tablet by mouth daily.     mycophenolate (CELLCEPT) 500 MG tablet Take 1,500 mg by mouth 2 (two) times daily.     promethazine-dextromethorphan (PROMETHAZINE-DM) 6.25-15 MG/5ML syrup Take 5 mLs by mouth 4 (four) times daily as needed for cough. 180 mL 0   Pyridoxine HCl (VITAMIN B-6 PO) Take by mouth daily.     Semaglutide, 1 MG/DOSE, (OZEMPIC, 1 MG/DOSE,) 4 MG/3ML SOPN Inject 1 mg into the skin once a week. 3 mL 5   UNABLE TO FIND 1 each by Does not apply  route daily. Med Name: POWER WHEELCHAIR  DX CODE-R26.89 1 each 0   UNABLE TO FIND 1 each by Does not apply route daily. Med Name: POWER SCOOTER DX CODE-R26.89 1 each 0   VITAMIN D PO Take by mouth daily.     No current facility-administered medications for this visit.    OBJECTIVE: White woman examined in a wheelchair  There were no vitals filed for this visit.    Wt Readings from Last 3 Encounters:  05/06/22 257 lb 14.4 oz (117 kg)  02/11/22 257 lb 1.6 oz (116.6 kg)  02/03/22 261 lb (118.4 kg)   There is no height or weight on file to calculate BMI.    ECOG FS:2 - Symptomatic, <50% confined to bed Physical exam deferred, telephone visit   LAB RESULTS:  CMP     Component Value Date/Time   NA 140 05/06/2022 0804   NA 138 07/05/2018 1635   NA 142 10/06/2016 1211   K 3.6 05/06/2022 0804   K 3.8 10/06/2016 1211   CL 107 05/06/2022 0804   CO2 26 05/06/2022 0804   CO2 25 10/06/2016 1211   GLUCOSE 182 (H) 05/06/2022 0804   GLUCOSE 179 (H) 10/06/2016 1211   BUN 14 05/06/2022 0804   BUN 19 07/05/2018 1635   BUN 16.9 10/06/2016 1211   CREATININE 0.85 05/06/2022 0804   CREATININE 1.09 (H) 09/28/2017 1516   CREATININE  1.1 10/06/2016 1211   CALCIUM 10.1 05/06/2022 0804   CALCIUM 10.4 10/06/2016 1211   PROT 6.6 05/06/2022 0804   PROT 7.5 07/05/2018 1635   PROT 7.6 10/06/2016 1211   ALBUMIN 3.7 05/06/2022 0804   ALBUMIN 4.6 07/05/2018 1635   ALBUMIN 3.9 10/06/2016 1211   AST 26 05/06/2022 0804   AST 33 10/06/2016 1211   ALT 14 05/06/2022 0804   ALT 20 10/06/2016 1211   ALKPHOS 94 05/06/2022 0804   ALKPHOS 86 10/06/2016 1211   BILITOT 0.7 05/06/2022 0804   BILITOT 0.52 10/06/2016 1211   GFRNONAA >60 05/06/2022 0804   GFRNONAA 52 (L) 09/28/2017 1516   GFRAA >60 11/07/2018 1133   GFRAA 61 09/28/2017 1516    INo results found for: "SPEP", "UPEP"  Lab Results  Component Value Date   WBC 2.4 (L) 05/06/2022   NEUTROABS 1.6 (L) 05/06/2022   HGB 12.2 05/06/2022    HCT 38.2 05/06/2022   MCV 90.1 05/06/2022   PLT 63 (L) 05/06/2022      Chemistry      Component Value Date/Time   NA 140 05/06/2022 0804   NA 138 07/05/2018 1635   NA 142 10/06/2016 1211   K 3.6 05/06/2022 0804   K 3.8 10/06/2016 1211   CL 107 05/06/2022 0804   CO2 26 05/06/2022 0804   CO2 25 10/06/2016 1211   BUN 14 05/06/2022 0804   BUN 19 07/05/2018 1635   BUN 16.9 10/06/2016 1211   CREATININE 0.85 05/06/2022 0804   CREATININE 1.09 (H) 09/28/2017 1516   CREATININE 1.1 10/06/2016 1211      Component Value Date/Time   CALCIUM 10.1 05/06/2022 0804   CALCIUM 10.4 10/06/2016 1211   ALKPHOS 94 05/06/2022 0804   ALKPHOS 86 10/06/2016 1211   AST 26 05/06/2022 0804   AST 33 10/06/2016 1211   ALT 14 05/06/2022 0804   ALT 20 10/06/2016 1211   BILITOT 0.7 05/06/2022 0804   BILITOT 0.52 10/06/2016 1211       No results found for: "LABCA2"  No components found for: "LABCA125"  No results for input(s): "INR" in the last 168 hours.  Urinalysis    Component Value Date/Time   COLORURINE YELLOW 08/30/2018 1357   APPEARANCEUR CLEAR 08/30/2018 1357   LABSPEC 1.024 08/30/2018 1357   PHURINE 5.0 08/30/2018 1357   GLUCOSEU >=500 (A) 08/30/2018 1357   HGBUR NEGATIVE 08/30/2018 1357   BILIRUBINUR NEGATIVE 08/30/2018 1357   KETONESUR NEGATIVE 08/30/2018 1357   PROTEINUR NEGATIVE 08/30/2018 1357   UROBILINOGEN 0.2 12/09/2011 1907   NITRITE NEGATIVE 08/30/2018 1357   LEUKOCYTESUR NEGATIVE 08/30/2018 1357    STUDIES: No results found.   ELIGIBLE FOR AVAILABLE RESEARCH PROTOCOL: no  ASSESSMENT: 72 y.o. [name] [name] woman status post left breast upper outer quadrant lumpectomy 08/04/2015 for a pT1c pNX, stage I A invasive ductal carcinoma, estrogen and progesterone receptor positive, HER-2 not amplified, with no HER-2 amplification.  (a) anterior margin was focally positive  (1) additional Left breast surgery 09/12/2015 cleared the margins and found all 5 axillary lymph nodes  sampled clear for a final stage pT1c pN0, stage IA  (2) Oncotype DX score of 12 predicts a 10 year risk of recurrence outside the breast of 8% if the patient's only systemic therapy is tamoxifen for 5 years. It also predicts no benefit from therapy.   (3) Adjuvant radiation 10/22/15 - 12/08/15   1) Left breast: 50.4 Gy in 28 fractions  2) Left breast boost: 10 Gy in 5 fractions  (4) started  anastrozole 02/02/2015, completed 6 yrs.  (5) She has mammogram recently 06/20/2021, possible asymmetry within the OUTER RIGHT breast on CC views warrants further evaluation. Targeted ultrasound is performed, showing hypoechoic oval mass with indistinct margins in the 10:30 o'clock location of the RIGHT breast 9 centimeters from nipple measuring 0.5 x 0.4 x 0.4 centimeters. This area is approximately 1.8 centimeters from the palpable bruise, bruise was thought to be fat necrosis.  Right breast lumpectomy showed invasive ductal carcinoma with clear margins of resection, final pathology showed tumor measuring 1.1 cm x 0.7 x 8.7 grade 3 previous biopsy showed ER 90% positive, PR 60% positive, Ki-67 was 2% HER2 2+ by IHC and negative by FISH  PLAN:  She is here for follow-up.  She is currently on adjuvant Faslodex.  She is doing quite well with the injections except for soreness at the site of injection.  In the past we have discussed about considering abemaciclib but given her multiple comorbidities, borderline performance status, we wont move forward with it.   We have also discussed about alternate of antiestrogen therapy with tamoxifen but once again she is very sedentary hence she Desa have high risk of blood clots.  She is comfortable with the injections and will continue Faslodex as recommended for 5 years.    She continues to have tenderness in the left chest wall. She wonders if this could be related to cough. But since it has been persistent and since she had breast cancer twice, I recommend  proceeding with bone scan.  She will proceed with bone scan. This has been ordered. I will plan to call her with the results.  Total time spent 5 minutes   I connected with  Monetta A Landry on 05/18/22 by a telephone application and verified that I am speaking with the correct person using two identifiers.   I discussed the limitations of evaluation and management by telemedicine. The patient expressed understanding and agreed to proceed.  *Total Encounter Time as defined by the Centers for Medicare and Medicaid Services includes, in addition to the face-to-face time of a patient visit (documented in the note above) non-face-to-face time: obtaining and reviewing outside history, ordering and reviewing medications, tests or procedures, care coordination (communications with other health care professionals or caregivers) and documentation in the medical record.

## 2022-05-18 NOTE — Telephone Encounter (Signed)
Pt. lvm returning call

## 2022-05-19 ENCOUNTER — Encounter: Payer: Self-pay | Admitting: Internal Medicine

## 2022-05-19 ENCOUNTER — Ambulatory Visit (INDEPENDENT_AMBULATORY_CARE_PROVIDER_SITE_OTHER): Payer: Medicare Other | Admitting: Internal Medicine

## 2022-05-19 VITALS — BP 132/60 | HR 94 | Ht 61.0 in | Wt 256.6 lb

## 2022-05-19 DIAGNOSIS — Z794 Long term (current) use of insulin: Secondary | ICD-10-CM | POA: Diagnosis not present

## 2022-05-19 DIAGNOSIS — J209 Acute bronchitis, unspecified: Secondary | ICD-10-CM

## 2022-05-19 DIAGNOSIS — J301 Allergic rhinitis due to pollen: Secondary | ICD-10-CM | POA: Diagnosis not present

## 2022-05-19 DIAGNOSIS — M940 Chondrocostal junction syndrome [Tietze]: Secondary | ICD-10-CM | POA: Diagnosis not present

## 2022-05-19 DIAGNOSIS — E1142 Type 2 diabetes mellitus with diabetic polyneuropathy: Secondary | ICD-10-CM | POA: Diagnosis not present

## 2022-05-19 MED ORDER — METHYLPREDNISOLONE 4 MG PO TBPK
ORAL_TABLET | ORAL | 0 refills | Status: DC
Start: 2022-05-19 — End: 2022-07-05

## 2022-05-19 MED ORDER — CHERATUSSIN AC 100-10 MG/5ML PO SOLN
5.0000 mL | Freq: Three times a day (TID) | ORAL | 0 refills | Status: DC | PRN
Start: 2022-05-19 — End: 2022-11-25

## 2022-05-19 MED ORDER — AZITHROMYCIN 250 MG PO TABS
ORAL_TABLET | ORAL | 0 refills | Status: AC
Start: 2022-05-19 — End: 2022-05-24

## 2022-05-19 NOTE — Assessment & Plan Note (Signed)
Cough and mild dyspnea for about the last 1 month Started empiric azithromycin Started Medrol Dosepak Cheratussin as needed for cough

## 2022-05-19 NOTE — Assessment & Plan Note (Signed)
Followed by ENT specialist at [facility] Needs to use Flonase regularly Continue Claritin Nasal saline spray PRN

## 2022-05-19 NOTE — Assessment & Plan Note (Addendum)
Lab Results  Component Value Date   HGBA1C 5.7 04/15/2022   Well-controlled On Basaglar and ISS - followed by Endocrinology On Ozempic and Farxiga Advised to follow diabetic diet On ARB Diabetic eye exam: Advised to follow up with Ophthalmology for diabetic eye exam

## 2022-05-19 NOTE — Progress Notes (Signed)
Acute Office Visit  Subjective:    Patient ID: [name] [name], female    DOB: 1950-03-12, 72 y.o.   MRN: [id number]  Chief Complaint  Patient presents with   Cough    Patient states she has a cough, will cough up blood and mucus. She is having a discomfort pain under her breast.    HPI Patient is in today for complaint of recent worsening of cough with yellowish sputum and chest tightness for the last 1 month.  She also reports noticing blood with excessive coughing.  She has left-sided lower chest wall pain underneath the breast.  She has tried taking Claritin for allergies.  Denies any fever or chills.  Of note, she has recently completed radiotherapy for recurrent breast cancer.  She is undergoing oncology evaluation for it.  Past Medical History:  Diagnosis Date   Abdominal wall contusion 12/09/2011   Achilles tendon contracture, bilateral 06/03/2016   Anxiety    Arthritis    Blood dyscrasia    low platelet count- sees [name] , Dr. Nelva Nay ( Note in EPIC from 07/2014) at [facility]   Breast cancer 2017   Left Breast   Breast cancer 07/2021   right breast IDC   Cancer    left breast   Chest wall contusion 12/09/2011   Cirrhosis of liver    Cough    Diabetes mellitus without complication    Diabetic neuropathy    Diarrhea due to drug 01/29/2021   Dyspnea    Gout    Gout    History of radiation therapy 10/22/15 - 12/08/15   left breast 50.4 Gy, boost to 10 Gy   History of radiation therapy    Right breast- 09/22/21-10/30/21- Dr. 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   Neuromuscular disorder    diabetic neuropathy   Neuropathy    Personal history of radiation therapy 2017   Left Breast Cancer   Posterior tibial tendon dysfunction (PTTD) of right lower extremity 10/09/2019   Sleep apnea    CPAP nightly   Spleen enlarged    Thyroid disease     Past Surgical History:  Procedure  Laterality Date   ANKLE FUSION Right 2022   BREAST BIOPSY Left 08/04/2015   Procedure: LEFT BREAST BIOPSY WITH NEEDLE LOCALIZATION;  Surgeon: Darnell Level, MD;  Location: [facility];  Service: General;  Laterality: Left;   BREAST BIOPSY Right 06/23/2021   BREAST DUCTAL SYSTEM EXCISION Left 08/04/2015   Procedure: LEFT EXCISION DUCTAL SYSTEM BREAST;  Surgeon: Darnell Level, MD;  Location: [facility];  Service: General;  Laterality: Left;   BREAST LUMPECTOMY Left 08/2015   BREAST LUMPECTOMY WITH AXILLARY LYMPH NODE BIOPSY Left 09/12/2015   Procedure: RE-EXCISION OF LEFT BREAST LUMPECTOMY WITH LEFT AXILLARY LYMPH NODE BIOPSY;  Surgeon: Darnell Level, MD;  Location: [facility];  Service: General;  Laterality: Left;   BREAST LUMPECTOMY WITH RADIOACTIVE SEED LOCALIZATION Right 08/12/2021   Procedure: RIGHT BREAST LUMPECTOMY WITH RADIOACTIVE SEED LOCALIZATION;  Surgeon: Abigail Miyamoto, MD;  Location: [facility];  Service: General;  Laterality: Right;  LMA   CHOLECYSTECTOMY     EYE SURGERY Bilateral 2022   Lashes growing backwards into the eye   JOINT REPLACEMENT     left knee   JOINT REPLACEMENT  2017   right knee   REVERSE SHOULDER ARTHROPLASTY Right 09/07/2018   Procedure:  RIGHT REVERSE SHOULDER ARTHROPLASTY;  Surgeon: Cammy Copa, MD;  Location: [facility];  Service: Orthopedics;  Laterality: Right;   TOTAL KNEE ARTHROPLASTY Right 02/28/2015   Procedure: RIGHT TOTAL KNEE ARTHROPLASTY;  Surgeon: Kathryne Hitch, MD;  Location: [facility];  Service: Orthopedics;  Laterality: Right;   TUBAL LIGATION      Family History  Problem Relation Age of Onset   Hypothyroidism Mother    Alzheimer's disease Mother    Diabetes Father    Cancer Sister        GYN cancer   Hypothyroidism Sister    Heart disease Brother    Arthritis Son    Diabetes Son    Arthritis Son    Cancer Maternal Grandmother    Heart failure Maternal Grandfather     Bone cancer Paternal Grandmother    Heart failure Paternal Grandmother     Social History   Socioeconomic History   Marital status: Married    Spouse name: edward   Number of children: 3   Years of education: 12th   Highest education level: Not on file  Occupational History   Occupation: Retired    Comment: Dance movement psychotherapist  Tobacco Use   Smoking status: Never   Smokeless tobacco: Never  Building services engineer Use: Never used  Substance and Sexual Activity   Alcohol use: No   Drug use: No   Sexual activity: Yes    Birth control/protection: Surgical    Comment: BTL  Other Topics Concern   Not on file  Social History Narrative   Not on file   Social Determinants of Health   Financial Resource Strain: Low Risk  (02/11/2021)   Overall Financial Resource Strain (CARDIA)    Difficulty of Paying Living Expenses: Not very hard  Food Insecurity: No Food Insecurity (08/27/2021)   Hunger Vital Sign    Worried About Running Out of Food in the Last Year: Never true    Ran Out of Food in the Last Year: Never true  Transportation Needs: No Transportation Needs (08/27/2021)   PRAPARE - Administrator, Civil Service (Medical): No    Lack of Transportation (Non-Medical): No  Physical Activity: Insufficiently Active (02/11/2021)   Exercise Vital Sign    Days of Exercise per Week: 3 days    Minutes of Exercise per Session: 30 min  Stress: No Stress Concern Present (02/11/2021)   [employer] of Occupational Health - Occupational Stress Questionnaire    Feeling of Stress : Only a little  Social Connections: Moderately Integrated (02/11/2021)   Social Connection and Isolation Panel [NHANES]    Frequency of Communication with Friends and Family: Three times a week    Frequency of Social Gatherings with Friends and Family: Never    Attends Religious Services: More than 4 times per year    Active Member of [employer] or Organizations: No    Attends Banker  Meetings: Never    Marital Status: Married  Catering manager Violence: Not At Risk (02/11/2021)   Humiliation, Afraid, Rape, and Kick questionnaire    Fear of Current or Ex-Partner: No    Emotionally Abused: No    Physically Abused: No    Sexually Abused: No    Outpatient Medications Prior to Visit  Medication Sig Dispense Refill   ACCU-CHEK AVIVA PLUS test strip USE AS INSTRUCTED TO CHECK BLOOD SUGAR 2 TIMES DAILY (Patient not taking: Reported on 02/03/2022) 100 strip 5   acetaminophen (TYLENOL) 500  MG tablet Take by mouth.     albuterol (VENTOLIN HFA) 108 (90 Base) MCG/ACT inhaler Inhale 1-2 puffs into the lungs every 4 (four) hours as needed for wheezing or shortness of breath. 18 g 2   Ascorbic Acid (VITAMIN C PO) Take by mouth daily.     buPROPion (WELLBUTRIN XL) 150 MG 24 hr tablet TAKE 1 TABLET BY MOUTH EVERY DAY IN THE MORNING 90 tablet 1   citalopram (CELEXA) 40 MG tablet TAKE 1 TABLET BY MOUTH EVERY DAY 90 tablet 1   Cyanocobalamin (VITAMIN B-12 PO) Take by mouth daily.     FARXIGA 10 MG TABS tablet TAKE 1 TABLET BY MOUTH DAILY BEFORE BREAKFAST. 90 tablet 3   fluticasone (FLONASE) 50 MCG/ACT nasal spray Place 1 spray into both nostrils as needed.      gabapentin (NEURONTIN) 600 MG tablet TAKE 1 TABLET IN THE MORNING AND 1 TABLET IN THE AFTERNOON AND 2TABS AT BEDTIME     insulin aspart (FIASP FLEXTOUCH) 100 UNIT/ML FlexTouch Pen Max daily 60 units (Patient taking differently: Max daily 62 units) 60 mL 3   insulin aspart (NOVOLOG FLEXPEN) 100 UNIT/ML FlexPen 20 Units. (Patient not taking: Reported on 02/03/2022)     Insulin Disposable Pump (OMNIPOD 5 G6 INTRO, GEN 5,) KIT 1 Device by Does not apply route every other day. 1 kit 0   Insulin Disposable Pump (OMNIPOD 5 G6 POD, GEN 5,) MISC 1 Device by Does not apply route every other day. 30 each 3   insulin glargine (LANTUS SOLOSTAR) 100 UNIT/ML Solostar Pen Inject 62 Units into the skin daily. 60 mL 4   Insulin Pen Needle (B-D UF III MINI  PEN NEEDLES) 31G X 5 MM MISC USE AS DIRECTED WITH INSULIN EVERY MORNING, NOON, EVENING AND AT BEDTIME 400 each 3   Insulin Pen Needle 30G X 5 MM MISC 1 Device by Does not apply route in the morning, at noon, in the evening, and at bedtime. 400 each 3   ketoconazole (NIZORAL) 2 % cream Apply 1 Application topically daily. 60 g 0   levothyroxine (SYNTHROID) 150 MCG tablet Take 1 tablet (150 mcg total) by mouth daily. 90 tablet 3   losartan (COZAAR) 25 MG tablet Take 1 tablet (25 mg total) by mouth daily. 90 tablet 1   methocarbamol (ROBAXIN) 500 MG tablet Take 1 tablet (500 mg total) by mouth every 8 (eight) hours as needed for muscle spasms. 30 tablet 0   Multiple Vitamins-Minerals (MULTIVITAMIN WITH MINERALS) tablet Take 1 tablet by mouth daily.     mycophenolate (CELLCEPT) 500 MG tablet Take 1,500 mg by mouth 2 (two) times daily.     promethazine-dextromethorphan (PROMETHAZINE-DM) 6.25-15 MG/5ML syrup Take 5 mLs by mouth 4 (four) times daily as needed for cough. 180 mL 0   Pyridoxine HCl (VITAMIN B-6 PO) Take by mouth daily.     Semaglutide, 1 MG/DOSE, (OZEMPIC, 1 MG/DOSE,) 4 MG/3ML SOPN Inject 1 mg into the skin once a week. 3 mL 5   UNABLE TO FIND 1 each by Does not apply route daily. Med Name: POWER WHEELCHAIR  DX CODE-R26.89 1 each 0   UNABLE TO FIND 1 each by Does not apply route daily. Med Name: POWER SCOOTER DX CODE-R26.89 1 each 0   VITAMIN D PO Take by mouth daily.     No facility-administered medications prior to visit.    Allergies  Allergen Reactions   Codeine Swelling   Influenza A (H1n1) Monoval Pf Anaphylaxis   Hydrocodone-Acetaminophen  Other (See Comments)   Lisinopril Other (See Comments)   Metformin Other (See Comments)   Pregabalin Other (See Comments)   Jardiance [Empagliflozin] Other (See Comments)    dehydration    Review of Systems  Constitutional:  Negative for chills and fever.  HENT:  Positive for congestion and sinus pressure. Negative for sinus pain.    Respiratory:  Positive for cough, shortness of breath and wheezing.   Cardiovascular:  Negative for chest pain and palpitations.  Gastrointestinal:  Negative for diarrhea, nausea and vomiting.  Genitourinary:  Negative for dysuria and hematuria.  Musculoskeletal:  Positive for arthralgias, back pain, gait problem and neck pain.  Skin:  Positive for rash.  Neurological:  Positive for weakness. Negative for dizziness.  Psychiatric/Behavioral:  Negative for agitation and behavioral problems.        Objective:    Physical Exam Vitals reviewed.  Constitutional:      General: She is not in acute distress.    Appearance: She is obese. She is not diaphoretic.     Comments: In wheelchair  HENT:     Head: Normocephalic and atraumatic.     Nose: Congestion present.     Mouth/Throat:     Mouth: Mucous membranes are moist.  Eyes:     General: No scleral icterus.    Extraocular Movements: Extraocular movements intact.  Cardiovascular:     Rate and Rhythm: Normal rate and regular rhythm.     Pulses: Normal pulses.     Heart sounds: Normal heart sounds. No murmur heard. Pulmonary:     Breath sounds: Rhonchi (B/l) present. No wheezing or rales.  Chest:     Chest wall: Tenderness present.  Musculoskeletal:     Cervical back: Neck supple. No tenderness.     Right lower leg: No edema.     Left lower leg: No edema.  Skin:    General: Skin is warm.     Findings: No rash.  Neurological:     General: No focal deficit present.     Mental Status: She is alert and oriented to person, place, and time.     Sensory: Sensory deficit present.     Motor: Weakness (4/5 in b/l LE) present.     Gait: Gait abnormal.  Psychiatric:        Mood and Affect: Mood is depressed.        Behavior: Behavior normal.     BP 132/60 (BP Location: Left Arm, Patient Position: Sitting, Cuff Size: Large)   Pulse 94   Ht 5\' 1"  (1.549 m)   Wt 256 lb 9.6 oz (116.4 kg)   SpO2 96%   BMI 48.48 kg/m  Wt Readings  from Last 3 Encounters:  05/19/22 256 lb 9.6 oz (116.4 kg)  05/06/22 257 lb 14.4 oz (117 kg)  02/11/22 257 lb 1.6 oz (116.6 kg)        Assessment & Plan:   Problem List Items Addressed This Visit       Respiratory   Allergic rhinitis due to pollen    Followed by ENT specialist at [facility] Needs to use Flonase regularly Continue Claritin Nasal saline spray PRN      Acute bronchitis - Primary    Cough and mild dyspnea for about the last 1 month Started empiric azithromycin Started Medrol Dosepak Cheratussin as needed for cough      Relevant Medications   azithromycin (ZITHROMAX) 250 MG tablet   methylPREDNISolone (MEDROL DOSEPAK) 4 MG TBPK tablet  guaiFENesin-codeine (CHERATUSSIN AC) 100-10 MG/5ML syrup     Endocrine   Type 2 diabetes mellitus with diabetic polyneuropathy, with long-term current use of insulin    Lab Results  Component Value Date   HGBA1C 5.7 04/15/2022  Well-controlled On Basaglar and ISS - followed by Endocrinology On Ozempic and Farxiga Advised to follow diabetic diet On ARB Diabetic eye exam: Advised to follow up with Ophthalmology for diabetic eye exam        Musculoskeletal and Integument   Acute costochondritis    Chest wall pain with tenderness likely due to acute costochondritis in the setting of URTI/acute bronchitis Started Medrol Dosepak        Meds ordered this encounter  Medications   azithromycin (ZITHROMAX) 250 MG tablet    Sig: Take 2 tablets on day 1, then 1 tablet daily on days 2 through 5    Dispense:  6 tablet    Refill:  0   methylPREDNISolone (MEDROL DOSEPAK) 4 MG TBPK tablet    Sig: Take as package instructions.    Dispense:  1 each    Refill:  0   guaiFENesin-codeine (CHERATUSSIN AC) 100-10 MG/5ML syrup    Sig: Take 5 mLs by mouth 3 (three) times daily as needed for cough.    Dispense:  120 mL    Refill:  0     Haydon Dorris Concha Se, MD

## 2022-05-19 NOTE — Patient Instructions (Signed)
Please start taking Azithromycin and Prednisone as prescribed.  Please take Cheratussin as needed for cough.  Please take Claritin for allergies. Use Flonase for allergies and nasal congestion.

## 2022-05-19 NOTE — Assessment & Plan Note (Signed)
Chest wall pain with tenderness likely due to acute costochondritis in the setting of URTI/acute bronchitis Started Medrol Dosepak

## 2022-05-20 ENCOUNTER — Telehealth: Payer: Self-pay | Admitting: Internal Medicine

## 2022-05-20 NOTE — Telephone Encounter (Signed)
azithromycin (ZITHROMAX) 250 MG tablet [[id number]] pt called and said she is having a reaction to this medication.

## 2022-05-20 NOTE — Telephone Encounter (Signed)
azithromycin (ZITHROMAX) 250 MG tablet [[id number]]  pt called and said she's having a reaction to med.

## 2022-05-20 NOTE — Telephone Encounter (Signed)
Patient advised.

## 2022-05-21 ENCOUNTER — Encounter: Payer: Self-pay | Admitting: Hematology and Oncology

## 2022-06-03 ENCOUNTER — Inpatient Hospital Stay: Payer: Medicare Other

## 2022-06-03 ENCOUNTER — Inpatient Hospital Stay: Payer: Medicare Other | Attending: Hematology and Oncology

## 2022-06-03 ENCOUNTER — Other Ambulatory Visit: Payer: Self-pay

## 2022-06-03 VITALS — BP 144/66 | HR 69 | Temp 97.9°F | Resp 16

## 2022-06-03 DIAGNOSIS — Z170 Estrogen receptor positive status [ER+]: Secondary | ICD-10-CM

## 2022-06-03 DIAGNOSIS — Z5111 Encounter for antineoplastic chemotherapy: Secondary | ICD-10-CM | POA: Diagnosis not present

## 2022-06-03 DIAGNOSIS — C50412 Malignant neoplasm of upper-outer quadrant of left female breast: Secondary | ICD-10-CM | POA: Insufficient documentation

## 2022-06-03 LAB — CMP (CANCER CENTER ONLY)
ALT: 12 U/L (ref 0–44)
AST: 29 U/L (ref 15–41)
Albumin: 3.9 g/dL (ref 3.5–5.0)
Alkaline Phosphatase: 89 U/L (ref 38–126)
Anion gap: 7 (ref 5–15)
BUN: 16 mg/dL (ref 8–23)
CO2: 27 mmol/L (ref 22–32)
Calcium: 9.8 mg/dL (ref 8.9–10.3)
Chloride: 105 mmol/L (ref 98–111)
Creatinine: 0.77 mg/dL (ref 0.44–1.00)
GFR, Estimated: >60 mL/min (ref >60)
Glucose, Bld: 145 mg/dL — ABNORMAL HIGH (ref 70–99)
Potassium: 3.8 mmol/L (ref 3.5–5.1)
Sodium: 139 mmol/L (ref 135–145)
Total Bilirubin: 0.8 mg/dL (ref 0.3–1.2)
Total Protein: 7 g/dL (ref 6.5–8.1)

## 2022-06-03 LAB — CBC WITH DIFFERENTIAL (CANCER CENTER ONLY)
Abs Immature Granulocytes: 0.01 10*3/uL (ref 0.00–0.07)
Basophils Absolute: 0 10*3/uL (ref 0.0–0.1)
Basophils Relative: 1 %
Eosinophils Absolute: 0.1 10*3/uL (ref 0.0–0.5)
Eosinophils Relative: 4 %
HCT: 39.5 % (ref 36.0–46.0)
Hemoglobin: 12.7 g/dL (ref 12.0–15.0)
Immature Granulocytes: 0 %
Lymphocytes Relative: 15 %
Lymphs Abs: 0.4 10*3/uL — ABNORMAL LOW (ref 0.7–4.0)
MCH: 28.6 pg (ref 26.0–34.0)
MCHC: 32.2 g/dL (ref 30.0–36.0)
MCV: 89 fL (ref 80.0–100.0)
Monocytes Absolute: 0.2 10*3/uL (ref 0.1–1.0)
Monocytes Relative: 9 %
Neutro Abs: 2 10*3/uL (ref 1.7–7.7)
Neutrophils Relative %: 71 %
Platelet Count: 66 10*3/uL — ABNORMAL LOW (ref 150–400)
RBC: 4.44 MIL/uL (ref 3.87–5.11)
RDW: 14.4 % (ref 11.5–15.5)
WBC Count: 2.8 10*3/uL — ABNORMAL LOW (ref 4.0–10.5)
nRBC: 0 % (ref 0.0–0.2)

## 2022-06-03 MED ORDER — FULVESTRANT 250 MG/5ML IM SOSY
500.0000 mg | PREFILLED_SYRINGE | Freq: Once | INTRAMUSCULAR | Status: AC
  Administered 2022-06-03: 500 mg via INTRAMUSCULAR
  Filled 2022-06-03: qty 10

## 2022-06-03 NOTE — Patient Instructions (Signed)
Fulvestrant injection What is this medication? FULVESTRANT (ful VES trant) blocks the effects of estrogen. It is used to treat breast cancer. This medicine Kempner be used for other purposes; ask your health care provider or pharmacist if you have questions. COMMON BRAND NAME(S): FASLODEX What should I tell my care team before I take this medication? They need to know if you have any of these conditions: bleeding disorders liver disease low blood counts, like low white cell, platelet, or red cell counts an unusual or allergic reaction to fulvestrant, other medicines, foods, dyes, or preservatives pregnant or trying to get pregnant breast-feeding How should I use this medication? This medicine is for injection into a muscle. It is usually given by a health care professional in a hospital or clinic setting. Talk to your pediatrician regarding the use of this medicine in children. Special care Wendell be needed. Overdosage: If you think you have taken too much of this medicine contact a poison control center or emergency room at once. NOTE: This medicine is only for you. Do not share this medicine with others. What if I miss a dose? It is important not to miss your dose. Call your doctor or health care professional if you are unable to keep an appointment. What Deblasi interact with this medication? medicines that treat or prevent blood clots like warfarin, enoxaparin, dalteparin, apixaban, dabigatran, and rivaroxaban This list Jewell not describe all possible interactions. Give your health care provider a list of all the medicines, herbs, non-prescription drugs, or dietary supplements you use. Also tell them if you smoke, drink alcohol, or use illegal drugs. Some items Vandyke interact with your medicine. What should I watch for while using this medication? Your condition will be monitored carefully while you are receiving this medicine. You will need important blood work done while you are taking this  medicine. Do not become pregnant while taking this medicine or for at least 1 year after stopping it. Women of child-bearing potential will need to have a negative pregnancy test before starting this medicine. Women should inform their doctor if they wish to become pregnant or think they might be pregnant. There is a potential for serious side effects to an unborn child. Men should inform their doctors if they wish to father a child. This medicine Vankleeck lower sperm counts. Talk to your health care professional or pharmacist for more information. Do not breast-feed an infant while taking this medicine or for 1 year after the last dose. What side effects Bowery I notice from receiving this medication? Side effects that you should report to your doctor or health care professional as soon as possible: allergic reactions like skin rash, itching or hives, swelling of the face, lips, or tongue feeling faint or lightheaded, falls pain, tingling, numbness, or weakness in the legs signs and symptoms of infection like fever or chills; cough; flu-like symptoms; sore throat vaginal bleeding Side effects that usually do not require medical attention (report to your doctor or health care professional if they continue or are bothersome): aches, pains constipation diarrhea headache hot flashes nausea, vomiting pain at site where injected stomach pain This list Alonzo not describe all possible side effects. Call your doctor for medical advice about side effects. You Laforge report side effects to FDA at 1-800-FDA-1088. Where should I keep my medication? This drug is given in a hospital or clinic and will not be stored at home. NOTE: This sheet is a summary. It Schwake not cover all possible information. If you have   questions about this medicine, talk to your doctor, pharmacist, or health care provider.  2023 Elsevier/Gold Standard (2017-05-03 00:00:00)  

## 2022-06-07 ENCOUNTER — Ambulatory Visit: Payer: BC Managed Care – PPO | Admitting: Internal Medicine

## 2022-06-08 ENCOUNTER — Encounter (HOSPITAL_COMMUNITY)
Admission: RE | Admit: 2022-06-08 | Discharge: 2022-06-08 | Disposition: A | Discharge status: Home / Self Care | Payer: Medicare Other | Source: Ambulatory Visit | Attending: Hematology and Oncology | Admitting: Hematology and Oncology

## 2022-06-08 DIAGNOSIS — Z170 Estrogen receptor positive status [ER+]: Secondary | ICD-10-CM | POA: Insufficient documentation

## 2022-06-08 DIAGNOSIS — C50912 Malignant neoplasm of unspecified site of left female breast: Secondary | ICD-10-CM | POA: Diagnosis not present

## 2022-06-08 DIAGNOSIS — C50412 Malignant neoplasm of upper-outer quadrant of left female breast: Secondary | ICD-10-CM | POA: Insufficient documentation

## 2022-06-08 MED ORDER — TECHNETIUM TC 99M MEDRONATE IV KIT
20.0000 | PACK | Freq: Once | INTRAVENOUS | Status: AC | PRN
  Administered 2022-06-08: 19.7 via INTRAVENOUS

## 2022-06-11 ENCOUNTER — Telehealth: Payer: Self-pay | Admitting: Internal Medicine

## 2022-06-11 NOTE — Telephone Encounter (Signed)
Called patient to schedule Medicare Annual Wellness Visit (AWV). No voicemail available to leave a message.  Last date of AWV: 02/11/2021   Please schedule an appointment at any time with Abby, NHA. .  If any questions, please contact me at [phone].  Thank you,  Judeth Cornfield,  AMB Clinical Support [facility] AWV Program Direct Dial ??[phone]

## 2022-06-15 ENCOUNTER — Telehealth: Payer: Self-pay | Admitting: Hematology and Oncology

## 2022-06-16 ENCOUNTER — Other Ambulatory Visit: Payer: Self-pay

## 2022-06-16 ENCOUNTER — Ambulatory Visit: Payer: Medicare Other | Admitting: Podiatry

## 2022-06-16 ENCOUNTER — Ambulatory Visit: Payer: BC Managed Care – PPO | Admitting: Internal Medicine

## 2022-06-21 ENCOUNTER — Telehealth: Payer: Self-pay | Admitting: Hematology and Oncology

## 2022-06-21 NOTE — Telephone Encounter (Signed)
Spoke with patient confirming upcoming rescheduled appointment

## 2022-06-22 ENCOUNTER — Other Ambulatory Visit: Payer: Self-pay | Admitting: Internal Medicine

## 2022-06-23 ENCOUNTER — Ambulatory Visit: Payer: Medicare Other | Admitting: Hematology and Oncology

## 2022-06-24 ENCOUNTER — Other Ambulatory Visit (HOSPITAL_COMMUNITY): Payer: Self-pay

## 2022-06-29 ENCOUNTER — Ambulatory Visit (INDEPENDENT_AMBULATORY_CARE_PROVIDER_SITE_OTHER): Payer: Medicare Other | Admitting: Podiatry

## 2022-06-29 DIAGNOSIS — M2041 Other hammer toe(s) (acquired), right foot: Secondary | ICD-10-CM | POA: Diagnosis not present

## 2022-06-29 DIAGNOSIS — R2681 Unsteadiness on feet: Secondary | ICD-10-CM | POA: Diagnosis not present

## 2022-06-29 DIAGNOSIS — E1142 Type 2 diabetes mellitus with diabetic polyneuropathy: Secondary | ICD-10-CM | POA: Diagnosis not present

## 2022-06-29 DIAGNOSIS — M79675 Pain in left toe(s): Secondary | ICD-10-CM

## 2022-06-29 DIAGNOSIS — Z794 Long term (current) use of insulin: Secondary | ICD-10-CM

## 2022-06-29 DIAGNOSIS — B351 Tinea unguium: Secondary | ICD-10-CM

## 2022-06-29 DIAGNOSIS — M2042 Other hammer toe(s) (acquired), left foot: Secondary | ICD-10-CM

## 2022-06-29 DIAGNOSIS — M79674 Pain in right toe(s): Secondary | ICD-10-CM | POA: Diagnosis not present

## 2022-06-29 NOTE — Progress Notes (Addendum)
Subjective:   Patient ID: [name] [name], female   DOB: 72 y.o.   MRN: [id number]   HPI Chief Complaint  Patient presents with   Diabetes    Rm 11 Diabetic foot exam. Pt wants to get an appointment to get diabetic shoes. Last A1c 5.7   Numbness    Bilateral foot numbness. Pt states she is diabetic and has severe numbness in her entire feet.    Nail Problem    RFC bilateral nail trim.     72 year old female presents for above concerns.  She said that she has been diabetic  "really bad" for the last 5 to 6 years.  She has had numbness ongoing for years and swinging worse.  She feels that her feet are on fire at times.  She is currently on gabapentin.  No open lesions.  She has pain to her legs with walking but is more from the neuropathy, numbness.  She states that she has swelling as her feet are numb.  Also she needs her nails trimmed as she cannot trim her self and thickened elongated.   Review of Systems  All other systems reviewed and are negative.  Past Medical History:  Diagnosis Date   Abdominal wall contusion 12/09/2011   Achilles tendon contracture, bilateral 06/03/2016   Anxiety    Arthritis    Blood dyscrasia    low platelet count- sees [name] , Dr. Nelva Nay ( Note in EPIC from 07/2014) at [facility]   Breast cancer [facility]) 2017   Left Breast   Breast cancer (HCC) 07/2021   right breast IDC   Cancer (HCC)    left breast   Chest wall contusion 12/09/2011   Cirrhosis of liver (HCC)    Cough    Diabetes mellitus without complication (HCC)    Diabetic neuropathy (HCC)    Diarrhea due to drug 01/29/2021   Dyspnea    Gout    Gout    History of radiation therapy 10/22/15 - 12/08/15   left breast 50.4 Gy, boost to 10 Gy   History of radiation therapy    Right breast- 09/22/21-10/30/21- Dr. 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   Neuromuscular disorder (HCC)    diabetic neuropathy    Neuropathy    Personal history of radiation therapy 2017   Left Breast Cancer   Posterior tibial tendon dysfunction (PTTD) of right lower extremity 10/09/2019   Sleep apnea    CPAP nightly   Spleen enlarged    Thyroid disease     Past Surgical History:  Procedure Laterality Date   ANKLE FUSION Right 2022   BREAST BIOPSY Left 08/04/2015   Procedure: LEFT BREAST BIOPSY WITH NEEDLE LOCALIZATION;  Surgeon: Darnell Level, MD;  Location: [facility];  Service: General;  Laterality: Left;   BREAST BIOPSY Right 06/23/2021   BREAST DUCTAL SYSTEM EXCISION Left 08/04/2015   Procedure: LEFT EXCISION DUCTAL SYSTEM BREAST;  Surgeon: Darnell Level, MD;  Location: [facility];  Service: General;  Laterality: Left;   BREAST LUMPECTOMY Left 08/2015   BREAST LUMPECTOMY WITH AXILLARY LYMPH NODE BIOPSY Left 09/12/2015   Procedure: RE-EXCISION OF LEFT BREAST LUMPECTOMY WITH LEFT AXILLARY LYMPH NODE BIOPSY;  Surgeon: Darnell Level, MD;  Location:  SURGERY CENTER;  Service: General;  Laterality: Left;   BREAST LUMPECTOMY WITH RADIOACTIVE SEED LOCALIZATION Right 08/12/2021   Procedure: RIGHT BREAST LUMPECTOMY WITH RADIOACTIVE SEED  LOCALIZATION;  Surgeon: Abigail Miyamoto, MD;  Location: [facility];  Service: General;  Laterality: Right;  LMA   CHOLECYSTECTOMY     EYE SURGERY Bilateral 2022   Lashes growing backwards into the eye   JOINT REPLACEMENT     left knee   JOINT REPLACEMENT  2017   right knee   REVERSE SHOULDER ARTHROPLASTY Right 09/07/2018   Procedure: RIGHT REVERSE SHOULDER ARTHROPLASTY;  Surgeon: Cammy Copa, MD;  Location[facility];  Service: Orthopedics;  Laterality: Right;   TOTAL KNEE ARTHROPLASTY Right 02/28/2015   Procedure: RIGHT TOTAL KNEE ARTHROPLASTY;  Surgeon: Kathryne Hitch, MD;  Location: [facility];  Service: Orthopedics;  Laterality: Right;   TUBAL LIGATION       Current Outpatient Medications:    ACCU-CHEK AVIVA PLUS  test strip, USE AS INSTRUCTED TO CHECK BLOOD SUGAR 2 TIMES DAILY (Patient not taking: Reported on 02/03/2022), Disp: 100 strip, Rfl: 5   acetaminophen (TYLENOL) 500 MG tablet, Take by mouth., Disp: , Rfl:    albuterol (VENTOLIN HFA) 108 (90 Base) MCG/ACT inhaler, Inhale 1-2 puffs into the lungs every 4 (four) hours as needed for wheezing or shortness of breath., Disp: 18 g, Rfl: 2   Ascorbic Acid (VITAMIN C PO), Take by mouth daily., Disp: , Rfl:    buPROPion (WELLBUTRIN XL) 150 MG 24 hr tablet, TAKE 1 TABLET BY MOUTH EVERY DAY IN THE MORNING, Disp: 90 tablet, Rfl: 1   citalopram (CELEXA) 40 MG tablet, TAKE 1 TABLET BY MOUTH EVERY DAY, Disp: 90 tablet, Rfl: 1   Cyanocobalamin (VITAMIN B-12 PO), Take by mouth daily., Disp: , Rfl:    FARXIGA 10 MG TABS tablet, TAKE 1 TABLET BY MOUTH DAILY BEFORE BREAKFAST., Disp: 90 tablet, Rfl: 3   fluticasone (FLONASE) 50 MCG/ACT nasal spray, Place 1 spray into both nostrils as needed. , Disp: , Rfl:    gabapentin (NEURONTIN) 600 MG tablet, TAKE 1 TABLET IN THE MORNING AND 1 TABLET IN THE AFTERNOON AND 2TABS AT BEDTIME, Disp: , Rfl:    guaiFENesin-codeine (CHERATUSSIN AC) 100-10 MG/5ML syrup, Take 5 mLs by mouth 3 (three) times daily as needed for cough., Disp: 120 mL, Rfl: 0   insulin aspart (FIASP FLEXTOUCH) 100 UNIT/ML FlexTouch Pen, Max daily 60 units (Patient taking differently: Max daily 62 units), Disp: 60 mL, Rfl: 3   insulin aspart (NOVOLOG FLEXPEN) 100 UNIT/ML FlexPen, 20 Units. (Patient not taking: Reported on 02/03/2022), Disp: , Rfl:    Insulin Disposable Pump (OMNIPOD 5 G6 INTRO, GEN 5,) KIT, 1 Device by Does not apply route every other day., Disp: 1 kit, Rfl: 0   Insulin Disposable Pump (OMNIPOD 5 G6 POD, GEN 5,) MISC, 1 Device by Does not apply route every other day., Disp: 30 each, Rfl: 3   insulin glargine (LANTUS SOLOSTAR) 100 UNIT/ML Solostar Pen, Inject 62 Units into the skin daily., Disp: 30 mL, Rfl: 0   Insulin Pen Needle (B-D UF III MINI PEN  NEEDLES) 31G X 5 MM MISC, USE AS DIRECTED WITH INSULIN EVERY MORNING, NOON, EVENING AND AT BEDTIME, Disp: 400 each, Rfl: 3   Insulin Pen Needle 30G X 5 MM MISC, 1 Device by Does not apply route in the morning, at noon, in the evening, and at bedtime., Disp: 400 each, Rfl: 3   ketoconazole (NIZORAL) 2 % cream, Apply 1 Application topically daily., Disp: 60 g, Rfl: 0   levothyroxine (SYNTHROID) 150 MCG tablet, Take 1 tablet (150 mcg total) by mouth daily., Disp:  90 tablet, Rfl: 3   losartan (COZAAR) 25 MG tablet, Take 1 tablet (25 mg total) by mouth daily., Disp: 90 tablet, Rfl: 1   methocarbamol (ROBAXIN) 500 MG tablet, Take 1 tablet (500 mg total) by mouth every 8 (eight) hours as needed for muscle spasms., Disp: 30 tablet, Rfl: 0   methylPREDNISolone (MEDROL DOSEPAK) 4 MG TBPK tablet, Take as package instructions., Disp: 1 each, Rfl: 0   Multiple Vitamins-Minerals (MULTIVITAMIN WITH MINERALS) tablet, Take 1 tablet by mouth daily., Disp: , Rfl:    mycophenolate (CELLCEPT) 500 MG tablet, Take 1,500 mg by mouth 2 (two) times daily., Disp: , Rfl:    promethazine-dextromethorphan (PROMETHAZINE-DM) 6.25-15 MG/5ML syrup, Take 5 mLs by mouth 4 (four) times daily as needed for cough., Disp: 180 mL, Rfl: 0   Pyridoxine HCl (VITAMIN B-6 PO), Take by mouth daily., Disp: , Rfl:    Semaglutide, 1 MG/DOSE, (OZEMPIC, 1 MG/DOSE,) 4 MG/3ML SOPN, Inject 1 mg into the skin once a week., Disp: 3 mL, Rfl: 5   UNABLE TO FIND, 1 each by Does not apply route daily. Med Name: POWER WHEELCHAIR  DX CODE-R26.89, Disp: 1 each, Rfl: 0   UNABLE TO FIND, 1 each by Does not apply route daily. Med Name: POWER SCOOTER DX CODE-R26.89, Disp: 1 each, Rfl: 0   VITAMIN D PO, Take by mouth daily., Disp: , Rfl:   Allergies  Allergen Reactions   Codeine Swelling   Influenza A (H1n1) Monoval Pf Anaphylaxis   Hydrocodone-Acetaminophen Other (See Comments)   Lisinopril Other (See Comments)   Metformin Other (See Comments)   Pregabalin  Other (See Comments)   Jardiance [Empagliflozin] Other (See Comments)    dehydration           Objective:  Physical Exam  General: AAO x3, NAD  Dermatological: Nails are hypertrophic, dystrophic, brittle, discolored, elongated 10. No surrounding redness or drainage. Tenderness nails 1-5 bilaterally. No open lesions or pre-ulcerative lesions are identified today.  Vascular: Dorsalis Pedis artery and Posterior Tibial artery pedal pulses are 2/4 bilateral with immedate capillary fill time. There is no pain with calf compression, swelling, warmth, erythema.   Neruologic: Sensation absent with Phoebe Perch monofilament.  Musculoskeletal: Hammertoes present   Gait: Unassisted, Nonantalgic.       Assessment:   72 year old female type 2 diabetes with neuropathy, symptomatic onychomycosis     Plan:  -Treatment options discussed including all alternatives, risks, and complications -Etiology of symptoms were discussed -Nails debrided 10 without complications or bleeding. -I do think she will benefit of diabetic shoes.  She was measured for this today and order was written. -Daily foot inspection -Discussed PT to help with gait, agreeable.  -Follow-up in 3 months or sooner if any problems arise. In the meantime, encouraged to call the office with any questions, concerns, change in symptoms.   Ovid Curd, DPM

## 2022-06-29 NOTE — Addendum Note (Signed)
Addended by: Ovid Curd R on: 06/29/2022 03:52 PM   Modules accepted: Orders

## 2022-06-30 ENCOUNTER — Telehealth: Payer: Self-pay

## 2022-06-30 NOTE — Telephone Encounter (Signed)
Tried to call patient to reschedule per cancelled AWV. No answer.

## 2022-07-01 ENCOUNTER — Inpatient Hospital Stay: Payer: Medicare Other

## 2022-07-01 DIAGNOSIS — R040 Epistaxis: Secondary | ICD-10-CM | POA: Diagnosis not present

## 2022-07-01 DIAGNOSIS — L121 Cicatricial pemphigoid: Secondary | ICD-10-CM | POA: Diagnosis not present

## 2022-07-01 DIAGNOSIS — J310 Chronic rhinitis: Secondary | ICD-10-CM | POA: Diagnosis not present

## 2022-07-01 DIAGNOSIS — J343 Hypertrophy of nasal turbinates: Secondary | ICD-10-CM | POA: Diagnosis not present

## 2022-07-01 DIAGNOSIS — J342 Deviated nasal septum: Secondary | ICD-10-CM | POA: Diagnosis not present

## 2022-07-01 DIAGNOSIS — J3489 Other specified disorders of nose and nasal sinuses: Secondary | ICD-10-CM | POA: Diagnosis not present

## 2022-07-02 ENCOUNTER — Inpatient Hospital Stay: Payer: Medicare Other

## 2022-07-02 VITALS — BP 136/64 | HR 67 | Temp 97.7°F | Resp 20

## 2022-07-02 DIAGNOSIS — C50412 Malignant neoplasm of upper-outer quadrant of left female breast: Secondary | ICD-10-CM

## 2022-07-02 DIAGNOSIS — Z170 Estrogen receptor positive status [ER+]: Secondary | ICD-10-CM

## 2022-07-02 DIAGNOSIS — Z5111 Encounter for antineoplastic chemotherapy: Secondary | ICD-10-CM | POA: Diagnosis not present

## 2022-07-02 LAB — CMP (CANCER CENTER ONLY)
ALT: 12 U/L (ref 0–44)
AST: 25 U/L (ref 15–41)
Albumin: 4 g/dL (ref 3.5–5.0)
Alkaline Phosphatase: 102 U/L (ref 38–126)
Anion gap: 8 (ref 5–15)
BUN: 18 mg/dL (ref 8–23)
CO2: 27 mmol/L (ref 22–32)
Calcium: 10.6 mg/dL — ABNORMAL HIGH (ref 8.9–10.3)
Chloride: 105 mmol/L (ref 98–111)
Creatinine: 0.93 mg/dL (ref 0.44–1.00)
GFR, Estimated: >60 mL/min (ref >60)
Glucose, Bld: 194 mg/dL — ABNORMAL HIGH (ref 70–99)
Potassium: 4.7 mmol/L (ref 3.5–5.1)
Sodium: 140 mmol/L (ref 135–145)
Total Bilirubin: 0.8 mg/dL (ref 0.3–1.2)
Total Protein: 7.1 g/dL (ref 6.5–8.1)

## 2022-07-02 LAB — CBC WITH DIFFERENTIAL (CANCER CENTER ONLY)
Abs Immature Granulocytes: 0.01 10*3/uL (ref 0.00–0.07)
Basophils Absolute: 0 10*3/uL (ref 0.0–0.1)
Basophils Relative: 1 %
Eosinophils Absolute: 0.1 10*3/uL (ref 0.0–0.5)
Eosinophils Relative: 4 %
HCT: 39.7 % (ref 36.0–46.0)
Hemoglobin: 12.9 g/dL (ref 12.0–15.0)
Immature Granulocytes: 0 %
Lymphocytes Relative: 17 %
Lymphs Abs: 0.5 10*3/uL — ABNORMAL LOW (ref 0.7–4.0)
MCH: 28.9 pg (ref 26.0–34.0)
MCHC: 32.5 g/dL (ref 30.0–36.0)
MCV: 89 fL (ref 80.0–100.0)
Monocytes Absolute: 0.3 10*3/uL (ref 0.1–1.0)
Monocytes Relative: 8 %
Neutro Abs: 2.3 10*3/uL (ref 1.7–7.7)
Neutrophils Relative %: 70 %
Platelet Count: 74 10*3/uL — ABNORMAL LOW (ref 150–400)
RBC: 4.46 MIL/uL (ref 3.87–5.11)
RDW: 15.1 % (ref 11.5–15.5)
WBC Count: 3.2 10*3/uL — ABNORMAL LOW (ref 4.0–10.5)
nRBC: 0 % (ref 0.0–0.2)

## 2022-07-02 MED ORDER — FULVESTRANT 250 MG/5ML IM SOSY
500.0000 mg | PREFILLED_SYRINGE | Freq: Once | INTRAMUSCULAR | Status: AC
  Administered 2022-07-02: 500 mg via INTRAMUSCULAR
  Filled 2022-07-02: qty 10

## 2022-07-05 ENCOUNTER — Encounter: Payer: Self-pay | Admitting: Internal Medicine

## 2022-07-05 ENCOUNTER — Ambulatory Visit (INDEPENDENT_AMBULATORY_CARE_PROVIDER_SITE_OTHER): Payer: Medicare Other | Admitting: Internal Medicine

## 2022-07-05 VITALS — BP 122/72 | HR 88 | Ht 61.0 in | Wt 249.8 lb

## 2022-07-05 DIAGNOSIS — C50412 Malignant neoplasm of upper-outer quadrant of left female breast: Secondary | ICD-10-CM | POA: Diagnosis not present

## 2022-07-05 DIAGNOSIS — K5903 Drug induced constipation: Secondary | ICD-10-CM | POA: Diagnosis not present

## 2022-07-05 DIAGNOSIS — Z170 Estrogen receptor positive status [ER+]: Secondary | ICD-10-CM

## 2022-07-05 DIAGNOSIS — R1012 Left upper quadrant pain: Secondary | ICD-10-CM

## 2022-07-05 DIAGNOSIS — R296 Repeated falls: Secondary | ICD-10-CM | POA: Diagnosis not present

## 2022-07-05 DIAGNOSIS — Z1211 Encounter for screening for malignant neoplasm of colon: Secondary | ICD-10-CM

## 2022-07-05 NOTE — Assessment & Plan Note (Signed)
S/p lumpectomy, followed by Oncology On Anastrazole Has recurrence, had radiation Check CT abdomen pelvis with contrast as she has abdominal pain currently

## 2022-07-05 NOTE — Assessment & Plan Note (Signed)
Advised to take Senokot as needed Maintain adequate hydration

## 2022-07-05 NOTE — Patient Instructions (Addendum)
Please take Senokot once daily for constipation.  Please get CT abdomen done as scheduled.

## 2022-07-05 NOTE — Assessment & Plan Note (Addendum)
Recent fall caused left UE and upper chest wall area pain, which have improved now -she has left-sided upper abdominal pain, which is unlikely related to fall  Likely due to diabetic neuropathy, OA of knee and ankle, DDD of lumbar spine and muscular atrophy Currently uses cane  Dependent for ADLs  She can safely use electric wheelchair. She is willing and motivated to use the power mobility device in the home.

## 2022-07-05 NOTE — Assessment & Plan Note (Signed)
Left upper quadrant abdominal pain, with tender bulging -unclear etiology, but could be partial SBO and/or lymph node enlargement Since she has history of recurrent breast CVA, will get CT abdomen pelvis with contrast Advised to avoid bending and heavy lifting Has Zofran as needed for nausea

## 2022-07-05 NOTE — Progress Notes (Signed)
Acute Office Visit  Subjective:    Patient ID: [name] [name], female    DOB: 26-Sep-1950, 72 y.o.   MRN: [id number]  Chief Complaint  Patient presents with   Fall    Patient fell in [place] and now is having abdominal soreness    HPI Patient is in today for c/o abdominal pain, LUQ, for the last 1 month. She has noticed a bulging in the area and colicky pain at times while standing and bending. She has chronic nausea, but denies any vomiting in the last 1 week. Denies diarrhea, but has had constipation. She takes OTC stool softener for it with some relief. Denies any melena or hematochezia. She has h/o breast ca, which has recurred in 2023 and required chemoradiation.  She also reports an episode of fall at home on 06/06/22, while bending to get her dog. She fell on a nightstand and rolled to the floor. She had left shoulder and arm pain, which has resolved now. She denies any head injury.  Denies any prodromal symptoms before the fall.  Past Medical History:  Diagnosis Date   Abdominal wall contusion 12/09/2011   Achilles tendon contracture, bilateral 06/03/2016   Anxiety    Arthritis    Blood dyscrasia    low platelet count- sees [name] , Dr. Nelva Nay ( Note in EPIC from 07/2014) at [facility]   Breast cancer [facility]) 2017   Left Breast   Breast cancer (HCC) 07/2021   right breast IDC   Cancer (HCC)    left breast   Chest wall contusion 12/09/2011   Cirrhosis of liver (HCC)    Cough    Diabetes mellitus without complication (HCC)    Diabetic neuropathy (HCC)    Diarrhea due to drug 01/29/2021   Dyspnea    Gout    Gout    History of radiation therapy 10/22/15 - 12/08/15   left breast 50.4 Gy, boost to 10 Gy   History of radiation therapy    Right breast- 09/22/21-10/30/21- Dr. 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   Neuromuscular disorder (HCC)    diabetic neuropathy   Neuropathy    Personal  history of radiation therapy 2017   Left Breast Cancer   Posterior tibial tendon dysfunction (PTTD) of right lower extremity 10/09/2019   Sleep apnea    CPAP nightly   Spleen enlarged    Thyroid disease     Past Surgical History:  Procedure Laterality Date   ANKLE FUSION Right 2022   BREAST BIOPSY Left 08/04/2015   Procedure: LEFT BREAST BIOPSY WITH NEEDLE LOCALIZATION;  Surgeon: Darnell Level, MD;  Location: [facility];  Service: General;  Laterality: Left;   BREAST BIOPSY Right 06/23/2021   BREAST DUCTAL SYSTEM EXCISION Left 08/04/2015   Procedure: LEFT EXCISION DUCTAL SYSTEM BREAST;  Surgeon: Darnell Level, MD;  Location: [facility];  Service: General;  Laterality: Left;   BREAST LUMPECTOMY Left 08/2015   BREAST LUMPECTOMY WITH AXILLARY LYMPH NODE BIOPSY Left 09/12/2015   Procedure: RE-EXCISION OF LEFT BREAST LUMPECTOMY WITH LEFT AXILLARY LYMPH NODE BIOPSY;  Surgeon: Darnell Level, MD;  Location: [facility];  Service: General;  Laterality: Left;   BREAST LUMPECTOMY WITH RADIOACTIVE SEED LOCALIZATION Right 08/12/2021   Procedure: RIGHT BREAST LUMPECTOMY WITH RADIOACTIVE SEED LOCALIZATION;  Surgeon: Abigail Miyamoto, MD;  Location: [facility];  Service: General;  Laterality: Right;  LMA   CHOLECYSTECTOMY     EYE SURGERY Bilateral 2022   Lashes growing backwards into the eye   JOINT REPLACEMENT     left knee   JOINT REPLACEMENT  2017   right knee   REVERSE SHOULDER ARTHROPLASTY Right 09/07/2018   Procedure: RIGHT REVERSE SHOULDER ARTHROPLASTY;  Surgeon: Cammy Copa, MD;  Location: [facility] OR;  Service: Orthopedics;  Laterality: Right;   TOTAL KNEE ARTHROPLASTY Right 02/28/2015   Procedure: RIGHT TOTAL KNEE ARTHROPLASTY;  Surgeon: Kathryne Hitch, MD;  Location: [facility];  Service: Orthopedics;  Laterality: Right;   TUBAL LIGATION      Family History  Problem Relation Age of Onset   Hypothyroidism Mother     Alzheimer's disease Mother    Diabetes Father    Cancer Sister        GYN cancer   Hypothyroidism Sister    Heart disease Brother    Arthritis Son    Diabetes Son    Arthritis Son    Cancer Maternal Grandmother    Heart failure Maternal Grandfather    Bone cancer Paternal Grandmother    Heart failure Paternal Grandmother     Social History   Socioeconomic History   Marital status: Married    Spouse name: edward   Number of children: 3   Years of education: 12th   Highest education level: Not on file  Occupational History   Occupation: Retired    Comment: Dance movement psychotherapist  Tobacco Use   Smoking status: Never   Smokeless tobacco: Never  Building services engineer Use: Never used  Substance and Sexual Activity   Alcohol use: No   Drug use: No   Sexual activity: Yes    Birth control/protection: Surgical    Comment: BTL  Other Topics Concern   Not on file  Social History Narrative   Not on file   Social Determinants of Health   Financial Resource Strain: Low Risk  (02/11/2021)   Overall Financial Resource Strain (CARDIA)    Difficulty of Paying Living Expenses: Not very hard  Food Insecurity: No Food Insecurity (08/27/2021)   Hunger Vital Sign    Worried About Running Out of Food in the Last Year: Never true    Ran Out of Food in the Last Year: Never true  Transportation Needs: No Transportation Needs (08/27/2021)   PRAPARE - Administrator, Civil Service (Medical): No    Lack of Transportation (Non-Medical): No  Physical Activity: Insufficiently Active (02/11/2021)   Exercise Vital Sign    Days of Exercise per Week: 3 days    Minutes of Exercise per Session: 30 min  Stress: No Stress Concern Present (02/11/2021)   [employer] of Occupational Health - Occupational Stress Questionnaire    Feeling of Stress : Only a little  Social Connections: Moderately Integrated (02/11/2021)   Social Connection and Isolation Panel [NHANES]    Frequency of  Communication with Friends and Family: Three times a week    Frequency of Social Gatherings with Friends and Family: Never    Attends Religious Services: More than 4 times per year    Active Member of [employer] or Organizations: No    Attends Banker Meetings: Never    Marital Status: Married  Catering manager Violence: Not At Risk (02/11/2021)   Humiliation, Afraid, Rape, and Kick questionnaire    Fear of Current or Ex-Partner: No    Emotionally Abused: No    Physically Abused: No  Sexually Abused: No    Outpatient Medications Prior to Visit  Medication Sig Dispense Refill   ACCU-CHEK AVIVA PLUS test strip USE AS INSTRUCTED TO CHECK BLOOD SUGAR 2 TIMES DAILY (Patient not taking: Reported on 02/03/2022) 100 strip 5   acetaminophen (TYLENOL) 500 MG tablet Take by mouth.     albuterol (VENTOLIN HFA) 108 (90 Base) MCG/ACT inhaler Inhale 1-2 puffs into the lungs every 4 (four) hours as needed for wheezing or shortness of breath. 18 g 2   Ascorbic Acid (VITAMIN C PO) Take by mouth daily.     buPROPion (WELLBUTRIN XL) 150 MG 24 hr tablet TAKE 1 TABLET BY MOUTH EVERY DAY IN THE MORNING 90 tablet 1   citalopram (CELEXA) 40 MG tablet TAKE 1 TABLET BY MOUTH EVERY DAY 90 tablet 1   Cyanocobalamin (VITAMIN B-12 PO) Take by mouth daily.     FARXIGA 10 MG TABS tablet TAKE 1 TABLET BY MOUTH DAILY BEFORE BREAKFAST. 90 tablet 3   fluticasone (FLONASE) 50 MCG/ACT nasal spray Place 1 spray into both nostrils as needed.      gabapentin (NEURONTIN) 600 MG tablet TAKE 1 TABLET IN THE MORNING AND 1 TABLET IN THE AFTERNOON AND 2TABS AT BEDTIME     guaiFENesin-codeine (CHERATUSSIN AC) 100-10 MG/5ML syrup Take 5 mLs by mouth 3 (three) times daily as needed for cough. 120 mL 0   insulin aspart (FIASP FLEXTOUCH) 100 UNIT/ML FlexTouch Pen Max daily 60 units (Patient taking differently: Max daily 62 units) 60 mL 3   insulin aspart (NOVOLOG FLEXPEN) 100 UNIT/ML FlexPen 20 Units. (Patient not taking: Reported on  02/03/2022)     Insulin Disposable Pump (OMNIPOD 5 G6 INTRO, GEN 5,) KIT 1 Device by Does not apply route every other day. 1 kit 0   Insulin Disposable Pump (OMNIPOD 5 G6 POD, GEN 5,) MISC 1 Device by Does not apply route every other day. 30 each 3   insulin glargine (LANTUS SOLOSTAR) 100 UNIT/ML Solostar Pen Inject 62 Units into the skin daily. 30 mL 0   Insulin Pen Needle (B-D UF III MINI PEN NEEDLES) 31G X 5 MM MISC USE AS DIRECTED WITH INSULIN EVERY MORNING, NOON, EVENING AND AT BEDTIME 400 each 3   Insulin Pen Needle 30G X 5 MM MISC 1 Device by Does not apply route in the morning, at noon, in the evening, and at bedtime. 400 each 3   ketoconazole (NIZORAL) 2 % cream Apply 1 Application topically daily. 60 g 0   levothyroxine (SYNTHROID) 150 MCG tablet Take 1 tablet (150 mcg total) by mouth daily. 90 tablet 3   losartan (COZAAR) 25 MG tablet Take 1 tablet (25 mg total) by mouth daily. 90 tablet 1   methocarbamol (ROBAXIN) 500 MG tablet Take 1 tablet (500 mg total) by mouth every 8 (eight) hours as needed for muscle spasms. 30 tablet 0   Multiple Vitamins-Minerals (MULTIVITAMIN WITH MINERALS) tablet Take 1 tablet by mouth daily.     mycophenolate (CELLCEPT) 500 MG tablet Take 1,500 mg by mouth 2 (two) times daily.     promethazine-dextromethorphan (PROMETHAZINE-DM) 6.25-15 MG/5ML syrup Take 5 mLs by mouth 4 (four) times daily as needed for cough. 180 mL 0   Pyridoxine HCl (VITAMIN B-6 PO) Take by mouth daily.     Semaglutide, 1 MG/DOSE, (OZEMPIC, 1 MG/DOSE,) 4 MG/3ML SOPN Inject 1 mg into the skin once a week. 3 mL 5   UNABLE TO FIND 1 each by Does not apply route daily. Med Name:  POWER WHEELCHAIR  DX CODE-R26.89 1 each 0   UNABLE TO FIND 1 each by Does not apply route daily. Med Name: POWER SCOOTER DX CODE-R26.89 1 each 0   VITAMIN D PO Take by mouth daily.     methylPREDNISolone (MEDROL DOSEPAK) 4 MG TBPK tablet Take as package instructions. 1 each 0   No facility-administered medications  prior to visit.    Allergies  Allergen Reactions   Codeine Swelling   Influenza A (H1n1) Monoval Pf Anaphylaxis   Hydrocodone-Acetaminophen Other (See Comments)   Lisinopril Other (See Comments)   Metformin Other (See Comments)   Pregabalin Other (See Comments)   Jardiance [Empagliflozin] Other (See Comments)    dehydration    Review of Systems  Constitutional:  Negative for chills and fever.  HENT:  Negative for congestion, sinus pressure and sinus pain.   Respiratory:  Positive for cough. Negative for shortness of breath and wheezing.   Cardiovascular:  Negative for chest pain and palpitations.  Gastrointestinal:  Positive for abdominal pain and nausea. Negative for diarrhea and vomiting.  Genitourinary:  Negative for dysuria and hematuria.  Musculoskeletal:  Positive for arthralgias, back pain, gait problem and neck pain.  Skin:  Negative for rash.  Neurological:  Positive for weakness. Negative for dizziness.  Psychiatric/Behavioral:  Negative for agitation and behavioral problems.        Objective:    Physical Exam Vitals reviewed.  Constitutional:      General: She is not in acute distress.    Appearance: She is obese. She is not diaphoretic.  HENT:     Head: Normocephalic and atraumatic.     Nose: No congestion.     Mouth/Throat:     Mouth: Mucous membranes are moist.  Eyes:     General: No scleral icterus.    Extraocular Movements: Extraocular movements intact.  Cardiovascular:     Rate and Rhythm: Normal rate and regular rhythm.     Pulses: Normal pulses.     Heart sounds: Normal heart sounds. No murmur heard. Pulmonary:     Breath sounds: No wheezing or rales.  Abdominal:     Palpations: Abdomen is soft.     Tenderness: There is abdominal tenderness (LUQ, with small bulgings in the area).     Comments: Central vertical scar, C/D/I  Musculoskeletal:     Cervical back: Neck supple. No tenderness.     Right lower leg: No edema.     Left lower leg: No  edema.  Skin:    General: Skin is warm.     Findings: No rash.  Neurological:     General: No focal deficit present.     Mental Status: She is alert and oriented to person, place, and time.     Sensory: Sensory deficit present.     Motor: Weakness (4/5 in b/l LE) present.     Gait: Gait abnormal.  Psychiatric:        Mood and Affect: Mood is depressed.        Behavior: Behavior normal.     BP 122/72 (BP Location: Left Arm, Patient Position: Sitting, Cuff Size: Large)   Pulse 88   Ht 5\' 1"  (1.549 m)   Wt 249 lb 12.8 oz (113.3 kg)   SpO2 91%   BMI 47.20 kg/m  Wt Readings from Last 3 Encounters:  07/05/22 249 lb 12.8 oz (113.3 kg)  05/19/22 256 lb 9.6 oz (116.4 kg)  05/06/22 257 lb 14.4 oz (117 kg)  Assessment & Plan:   Problem List Items Addressed This Visit       Digestive   Drug-induced constipation    Advised to take Senokot as needed Maintain adequate hydration        Other   Recurrent falls    Recent fall caused left UE and upper chest wall area pain, which have improved now -she has left-sided upper abdominal pain, which is unlikely related to fall  Likely due to diabetic neuropathy, OA of knee and ankle, DDD of lumbar spine and muscular atrophy Currently uses cane  Dependent for ADLs  She can safely use electric wheelchair. She is willing and motivated to use the power mobility device in the home.      Malignant neoplasm of upper-outer quadrant of left breast in female, estrogen receptor positive (HCC)    S/p lumpectomy, followed by Oncology On Anastrazole Has recurrence, had radiation Check CT abdomen pelvis with contrast as she has abdominal pain currently      Relevant Orders   CT Abdomen Pelvis W Contrast   LUQ abdominal pain - Primary    Left upper quadrant abdominal pain, with tender bulging -unclear etiology, but could be partial SBO and/or lymph node enlargement Since she has history of recurrent breast CVA, will get CT abdomen  pelvis with contrast Advised to avoid bending and heavy lifting Has Zofran as needed for nausea      Relevant Orders   CT Abdomen Pelvis W Contrast   Other Visit Diagnoses     Screening for colon cancer       Relevant Orders   Ambulatory referral to Gastroenterology        No orders of the defined types were placed in this encounter.    Anabel Halon, MD

## 2022-07-06 ENCOUNTER — Encounter (INDEPENDENT_AMBULATORY_CARE_PROVIDER_SITE_OTHER): Payer: Self-pay | Admitting: *Deleted

## 2022-07-07 ENCOUNTER — Other Ambulatory Visit (HOSPITAL_BASED_OUTPATIENT_CLINIC_OR_DEPARTMENT_OTHER): Payer: Medicare Other

## 2022-07-08 ENCOUNTER — Ambulatory Visit
Admission: RE | Admit: 2022-07-08 | Discharge: 2022-07-08 | Disposition: A | Discharge status: Home / Self Care | Payer: Medicare Other | Source: Ambulatory Visit | Attending: Internal Medicine | Admitting: Internal Medicine

## 2022-07-08 DIAGNOSIS — Z170 Estrogen receptor positive status [ER+]: Secondary | ICD-10-CM | POA: Insufficient documentation

## 2022-07-08 DIAGNOSIS — K429 Umbilical hernia without obstruction or gangrene: Secondary | ICD-10-CM | POA: Diagnosis not present

## 2022-07-08 DIAGNOSIS — C50412 Malignant neoplasm of upper-outer quadrant of left female breast: Secondary | ICD-10-CM | POA: Diagnosis not present

## 2022-07-08 DIAGNOSIS — R1012 Left upper quadrant pain: Secondary | ICD-10-CM | POA: Diagnosis not present

## 2022-07-08 DIAGNOSIS — K7460 Unspecified cirrhosis of liver: Secondary | ICD-10-CM | POA: Diagnosis not present

## 2022-07-08 MED ORDER — IOHEXOL 300 MG/ML  SOLN
100.0000 mL | Freq: Once | INTRAMUSCULAR | Status: AC | PRN
  Administered 2022-07-08: 100 mL via INTRAVENOUS

## 2022-07-12 ENCOUNTER — Other Ambulatory Visit: Payer: Self-pay | Admitting: Internal Medicine

## 2022-07-12 ENCOUNTER — Other Ambulatory Visit: Payer: Self-pay

## 2022-07-12 DIAGNOSIS — K7460 Unspecified cirrhosis of liver: Secondary | ICD-10-CM | POA: Insufficient documentation

## 2022-07-15 ENCOUNTER — Other Ambulatory Visit: Payer: Self-pay

## 2022-07-15 MED ORDER — OZEMPIC (1 MG/DOSE) 4 MG/3ML ~~LOC~~ SOPN
1.0000 mg | PEN_INJECTOR | SUBCUTANEOUS | 5 refills | Status: DC
  Filled 2022-07-15 – 2022-07-29 (×2): qty 3, 28d supply, fill #0
  Filled 2022-09-29: qty 3, 28d supply, fill #1
  Filled 2022-11-05: qty 3, 28d supply, fill #2
  Filled 2022-12-18: qty 3, 28d supply, fill #3
  Filled 2023-01-31: qty 3, 28d supply, fill #4
  Filled 2023-03-23: qty 3, 28d supply, fill #5

## 2022-07-21 ENCOUNTER — Other Ambulatory Visit: Payer: Self-pay

## 2022-07-27 ENCOUNTER — Telehealth: Payer: Self-pay | Admitting: Internal Medicine

## 2022-07-27 NOTE — Telephone Encounter (Signed)
Patient called need another referral for stomach hernia in stomach to same gastrologist Dr Karilyn Cota office. Call patient back at [phone] when setup

## 2022-07-28 ENCOUNTER — Other Ambulatory Visit: Payer: Self-pay | Admitting: *Deleted

## 2022-07-28 DIAGNOSIS — D696 Thrombocytopenia, unspecified: Secondary | ICD-10-CM

## 2022-07-28 DIAGNOSIS — Z170 Estrogen receptor positive status [ER+]: Secondary | ICD-10-CM

## 2022-07-28 NOTE — Telephone Encounter (Signed)
Spoke to patient

## 2022-07-29 ENCOUNTER — Other Ambulatory Visit: Payer: Self-pay

## 2022-07-29 ENCOUNTER — Inpatient Hospital Stay: Payer: Medicare Other | Attending: Hematology and Oncology | Admitting: Hematology and Oncology

## 2022-07-29 ENCOUNTER — Inpatient Hospital Stay: Payer: Medicare Other

## 2022-07-29 ENCOUNTER — Encounter: Payer: Self-pay | Admitting: Internal Medicine

## 2022-07-29 ENCOUNTER — Ambulatory Visit (INDEPENDENT_AMBULATORY_CARE_PROVIDER_SITE_OTHER): Payer: Medicare Other | Admitting: Internal Medicine

## 2022-07-29 VITALS — BP 136/64 | HR 77 | Temp 97.9°F | Resp 16 | Wt 254.8 lb

## 2022-07-29 VITALS — BP 131/68 | HR 90 | Ht 61.0 in | Wt 254.0 lb

## 2022-07-29 DIAGNOSIS — Z794 Long term (current) use of insulin: Secondary | ICD-10-CM

## 2022-07-29 DIAGNOSIS — K7460 Unspecified cirrhosis of liver: Secondary | ICD-10-CM | POA: Insufficient documentation

## 2022-07-29 DIAGNOSIS — R1012 Left upper quadrant pain: Secondary | ICD-10-CM

## 2022-07-29 DIAGNOSIS — C50411 Malignant neoplasm of upper-outer quadrant of right female breast: Secondary | ICD-10-CM | POA: Insufficient documentation

## 2022-07-29 DIAGNOSIS — D696 Thrombocytopenia, unspecified: Secondary | ICD-10-CM

## 2022-07-29 DIAGNOSIS — Z8049 Family history of malignant neoplasm of other genital organs: Secondary | ICD-10-CM | POA: Insufficient documentation

## 2022-07-29 DIAGNOSIS — Z170 Estrogen receptor positive status [ER+]: Secondary | ICD-10-CM | POA: Diagnosis not present

## 2022-07-29 DIAGNOSIS — C50412 Malignant neoplasm of upper-outer quadrant of left female breast: Secondary | ICD-10-CM

## 2022-07-29 DIAGNOSIS — N329 Bladder disorder, unspecified: Secondary | ICD-10-CM

## 2022-07-29 DIAGNOSIS — Z79811 Long term (current) use of aromatase inhibitors: Secondary | ICD-10-CM | POA: Insufficient documentation

## 2022-07-29 DIAGNOSIS — Z923 Personal history of irradiation: Secondary | ICD-10-CM | POA: Diagnosis not present

## 2022-07-29 DIAGNOSIS — Z808 Family history of malignant neoplasm of other organs or systems: Secondary | ICD-10-CM | POA: Diagnosis not present

## 2022-07-29 DIAGNOSIS — R161 Splenomegaly, not elsewhere classified: Secondary | ICD-10-CM

## 2022-07-29 DIAGNOSIS — K429 Umbilical hernia without obstruction or gangrene: Secondary | ICD-10-CM | POA: Diagnosis not present

## 2022-07-29 DIAGNOSIS — I8510 Secondary esophageal varices without bleeding: Secondary | ICD-10-CM | POA: Diagnosis not present

## 2022-07-29 DIAGNOSIS — E1142 Type 2 diabetes mellitus with diabetic polyneuropathy: Secondary | ICD-10-CM | POA: Diagnosis not present

## 2022-07-29 DIAGNOSIS — R0789 Other chest pain: Secondary | ICD-10-CM | POA: Diagnosis not present

## 2022-07-29 DIAGNOSIS — I10 Essential (primary) hypertension: Secondary | ICD-10-CM | POA: Diagnosis not present

## 2022-07-29 DIAGNOSIS — Z8041 Family history of malignant neoplasm of ovary: Secondary | ICD-10-CM | POA: Insufficient documentation

## 2022-07-29 LAB — CMP (CANCER CENTER ONLY)
ALT: 15 U/L (ref 0–44)
AST: 27 U/L (ref 15–41)
Albumin: 3.8 g/dL (ref 3.5–5.0)
Alkaline Phosphatase: 106 U/L (ref 38–126)
Anion gap: 7 (ref 5–15)
BUN: 19 mg/dL (ref 8–23)
CO2: 29 mmol/L (ref 22–32)
Calcium: 10.2 mg/dL (ref 8.9–10.3)
Chloride: 106 mmol/L (ref 98–111)
Creatinine: 0.88 mg/dL (ref 0.44–1.00)
GFR, Estimated: >60 mL/min (ref >60)
Glucose, Bld: 172 mg/dL — ABNORMAL HIGH (ref 70–99)
Potassium: 4.6 mmol/L (ref 3.5–5.1)
Sodium: 142 mmol/L (ref 135–145)
Total Bilirubin: 0.8 mg/dL (ref 0.3–1.2)
Total Protein: 7.1 g/dL (ref 6.5–8.1)

## 2022-07-29 LAB — CBC WITH DIFFERENTIAL (CANCER CENTER ONLY)
Abs Immature Granulocytes: 0.01 10*3/uL (ref 0.00–0.07)
Basophils Absolute: 0 10*3/uL (ref 0.0–0.1)
Basophils Relative: 1 %
Eosinophils Absolute: 0.1 10*3/uL (ref 0.0–0.5)
Eosinophils Relative: 4 %
HCT: 40.8 % (ref 36.0–46.0)
Hemoglobin: 12.6 g/dL (ref 12.0–15.0)
Immature Granulocytes: 0 %
Lymphocytes Relative: 15 %
Lymphs Abs: 0.5 10*3/uL — ABNORMAL LOW (ref 0.7–4.0)
MCH: 28.7 pg (ref 26.0–34.0)
MCHC: 30.9 g/dL (ref 30.0–36.0)
MCV: 92.9 fL (ref 80.0–100.0)
Monocytes Absolute: 0.3 10*3/uL (ref 0.1–1.0)
Monocytes Relative: 10 %
Neutro Abs: 2.3 10*3/uL (ref 1.7–7.7)
Neutrophils Relative %: 70 %
Platelet Count: 72 10*3/uL — ABNORMAL LOW (ref 150–400)
RBC: 4.39 MIL/uL (ref 3.87–5.11)
RDW: 15.4 % (ref 11.5–15.5)
WBC Count: 3.3 10*3/uL — ABNORMAL LOW (ref 4.0–10.5)
nRBC: 0 % (ref 0.0–0.2)

## 2022-07-29 MED ORDER — EXEMESTANE 25 MG PO TABS: 25.0000 mg | ORAL_TABLET | Freq: Every day | ORAL | 1 refills | Status: DC

## 2022-07-29 NOTE — Assessment & Plan Note (Signed)
BP Readings from Last 1 Encounters:  07/29/22 131/68   Well-controlled with Losartan Counseled for compliance with the medications Advised DASH diet and moderate exercise/walking as tolerated

## 2022-07-29 NOTE — Assessment & Plan Note (Signed)
Left upper quadrant abdominal pain, with tender bulging -likely splenomegaly CT abdomen showed periumbilical hernia with fat stranding and collateral vessels-referred to general surgery Advised to avoid bending and heavy lifting Has Zofran as needed for nausea

## 2022-07-29 NOTE — Assessment & Plan Note (Signed)
Noted on CT abdomen Has portal hypertension and esophageal varices as well Needs GI evaluation

## 2022-07-29 NOTE — Assessment & Plan Note (Signed)
BMI Readings from Last 3 Encounters:  07/29/22 47.99 kg/m  07/29/22 48.14 kg/m  07/05/22 47.20 kg/m     Diet modification and ambulation as tolerated On Ozempic for type 2 DM

## 2022-07-29 NOTE — Progress Notes (Signed)
Established Patient Office Visit  Subjective:  Patient ID: [name] [name], female    DOB: 02/19/1950  Age: 72 y.o. MRN: [id number]  CC:  Chief Complaint  Patient presents with   CT results    Discuss and follow up on CT results     HPI [name] [name] is a 72 y.o. female with past medical history of HTN, type II DM with neuropathy, OSA, hypothyroidism, OA of multiple joints, gout, MDD, breast cancer s/p lumpectomy and morbid obesity who presents for f/u of her chronic medical conditions.  She still complains of severe left upper quadrant abdominal pain, worse with movement.  She had CT abdomen done, which showed periumbilical hernia, cirrhosis of liver and splenomegaly and gas in urinary bladder.  She has chronic constipation, but denies any recent melena or hematochezia.  Denies any recent injury.   HTN: Her BP was well controlled in the office today.  She takes Losartan.  She denies any headache, dizziness, chest pain, or palpitations currently.   Type II DM with HLD: Followed by endocrinology.  She is on Ozempic, Lantus 62 U QD and ISS.  She also takes Victoza and [place].  Her last HbA1C was 5.7. She denies any polyuria or polydipsia currently.  She takes gabapentin for neuropathy, but still has severe burning pain of bilateral feet.  She also has chronic numbness of bilateral feet.  She used to follow-up with neurology, but has not visited them for last few months.  She has history of left breast cancer, s/p lumpectomy.  She was on anastrozole for ER/PR positive breast cancer and follows up with Oncology. She has completed radiation for recurrent breast cancer.   Past Medical History:  Diagnosis Date   Abdominal wall contusion 12/09/2011   Achilles tendon contracture, bilateral 06/03/2016   Anxiety    Arthritis    Blood dyscrasia    low platelet count- sees [name] , Dr. Nelva Nay ( Note in EPIC from 07/2014) at [facility]   Breast cancer [facility]) 2017   Left Breast    Breast cancer (HCC) 07/2021   right breast IDC   Cancer (HCC)    left breast   Chest wall contusion 12/09/2011   Cirrhosis of liver (HCC)    Cough    Diabetes mellitus without complication (HCC)    Diabetic neuropathy (HCC)    Diarrhea due to drug 01/29/2021   Dyspnea    Gout    Gout    History of radiation therapy 10/22/15 - 12/08/15   left breast 50.4 Gy, boost to 10 Gy   History of radiation therapy    Right breast- 09/22/21-10/30/21- Dr. 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   Neuromuscular disorder (HCC)    diabetic neuropathy   Neuropathy    Personal history of radiation therapy 2017   Left Breast Cancer   Posterior tibial tendon dysfunction (PTTD) of right lower extremity 10/09/2019   Sleep apnea    CPAP nightly   Spleen enlarged    Thyroid disease     Past Surgical History:  Procedure Laterality Date   ANKLE FUSION Right 2022   BREAST BIOPSY Left 08/04/2015   Procedure: LEFT BREAST BIOPSY WITH NEEDLE LOCALIZATION;  Surgeon: Darnell Level, MD;  Location: [facility];  Service: General;  Laterality: Left;   BREAST BIOPSY Right 06/23/2021   BREAST DUCTAL SYSTEM EXCISION Left 08/04/2015   Procedure: LEFT EXCISION  DUCTAL SYSTEM BREAST;  Surgeon: Darnell Level, MD;  Location: [facility];  Service: General;  Laterality: Left;   BREAST LUMPECTOMY Left 08/2015   BREAST LUMPECTOMY WITH AXILLARY LYMPH NODE BIOPSY Left 09/12/2015   Procedure: RE-EXCISION OF LEFT BREAST LUMPECTOMY WITH LEFT AXILLARY LYMPH NODE BIOPSY;  Surgeon: Darnell Level, MD;  Location: [facility];  Service: General;  Laterality: Left;   BREAST LUMPECTOMY WITH RADIOACTIVE SEED LOCALIZATION Right 08/12/2021   Procedure: RIGHT BREAST LUMPECTOMY WITH RADIOACTIVE SEED LOCALIZATION;  Surgeon: Abigail Miyamoto, MD;  Location: [facility];  Service: General;  Laterality: Right;  LMA    CHOLECYSTECTOMY     EYE SURGERY Bilateral 2022   Lashes growing backwards into the eye   JOINT REPLACEMENT     left knee   JOINT REPLACEMENT  2017   right knee   REVERSE SHOULDER ARTHROPLASTY Right 09/07/2018   Procedure: RIGHT REVERSE SHOULDER ARTHROPLASTY;  Surgeon: Cammy Copa, MD;  Location: [facility];  Service: Orthopedics;  Laterality: Right;   TOTAL KNEE ARTHROPLASTY Right 02/28/2015   Procedure: RIGHT TOTAL KNEE ARTHROPLASTY;  Surgeon: Kathryne Hitch, MD;  Location: [facility];  Service: Orthopedics;  Laterality: Right;   TUBAL LIGATION      Family History  Problem Relation Age of Onset   Hypothyroidism Mother    Alzheimer's disease Mother    Diabetes Father    Cancer Sister        GYN cancer   Hypothyroidism Sister    Heart disease Brother    Arthritis Son    Diabetes Son    Arthritis Son    Cancer Maternal Grandmother    Heart failure Maternal Grandfather    Bone cancer Paternal Grandmother    Heart failure Paternal Grandmother     Social History   Socioeconomic History   Marital status: Married    Spouse name: edward   Number of children: 3   Years of education: 12th   Highest education level: Not on file  Occupational History   Occupation: Retired    Comment: Dance movement psychotherapist  Tobacco Use   Smoking status: Never   Smokeless tobacco: Never  Building services engineer Use: Never used  Substance and Sexual Activity   Alcohol use: No   Drug use: No   Sexual activity: Yes    Birth control/protection: Surgical    Comment: BTL  Other Topics Concern   Not on file  Social History Narrative   Not on file   Social Determinants of Health   Financial Resource Strain: Low Risk  (02/11/2021)   Overall Financial Resource Strain (CARDIA)    Difficulty of Paying Living Expenses: Not very hard  Food Insecurity: No Food Insecurity (08/27/2021)   Hunger Vital Sign    Worried About Running Out of Food in the Last Year: Never true    Ran Out of Food in the  Last Year: Never true  Transportation Needs: No Transportation Needs (08/27/2021)   PRAPARE - Administrator, Civil Service (Medical): No    Lack of Transportation (Non-Medical): No  Physical Activity: Insufficiently Active (02/11/2021)   Exercise Vital Sign    Days of Exercise per Week: 3 days    Minutes of Exercise per Session: 30 min  Stress: No Stress Concern Present (02/11/2021)   [employer] of Occupational Health - Occupational Stress Questionnaire    Feeling of Stress : Only a little  Social Connections: Moderately Integrated (02/11/2021)  Social Connection and Isolation Panel [NHANES]    Frequency of Communication with Friends and Family: Three times a week    Frequency of Social Gatherings with Friends and Family: Never    Attends Religious Services: More than 4 times per year    Active Member of [employer] or Organizations: No    Attends Banker Meetings: Never    Marital Status: Married  Catering manager Violence: Not At Risk (02/11/2021)   Humiliation, Afraid, Rape, and Kick questionnaire    Fear of Current or Ex-Partner: No    Emotionally Abused: No    Physically Abused: No    Sexually Abused: No    Outpatient Medications Prior to Visit  Medication Sig Dispense Refill   ACCU-CHEK AVIVA PLUS test strip USE AS INSTRUCTED TO CHECK BLOOD SUGAR 2 TIMES DAILY (Patient not taking: Reported on 02/03/2022) 100 strip 5   acetaminophen (TYLENOL) 500 MG tablet Take by mouth.     albuterol (VENTOLIN HFA) 108 (90 Base) MCG/ACT inhaler Inhale 1-2 puffs into the lungs every 4 (four) hours as needed for wheezing or shortness of breath. 18 g 2   Ascorbic Acid (VITAMIN C PO) Take by mouth daily.     buPROPion (WELLBUTRIN XL) 150 MG 24 hr tablet TAKE 1 TABLET BY MOUTH EVERY DAY IN THE MORNING 90 tablet 1   citalopram (CELEXA) 40 MG tablet TAKE 1 TABLET BY MOUTH EVERY DAY 90 tablet 1   Cyanocobalamin (VITAMIN B-12 PO) Take by mouth daily.     exemestane (AROMASIN) 25  MG tablet Take 1 tablet (25 mg total) by mouth daily after breakfast. 90 tablet 1   FARXIGA 10 MG TABS tablet TAKE 1 TABLET BY MOUTH DAILY BEFORE BREAKFAST. 90 tablet 3   fluticasone (FLONASE) 50 MCG/ACT nasal spray Place 1 spray into both nostrils as needed.      gabapentin (NEURONTIN) 600 MG tablet TAKE 1 TABLET IN THE MORNING AND 1 TABLET IN THE AFTERNOON AND 2TABS AT BEDTIME     guaiFENesin-codeine (CHERATUSSIN AC) 100-10 MG/5ML syrup Take 5 mLs by mouth 3 (three) times daily as needed for cough. 120 mL 0   insulin aspart (FIASP FLEXTOUCH) 100 UNIT/ML FlexTouch Pen Max daily 60 units (Patient taking differently: Max daily 62 units) 60 mL 3   insulin aspart (NOVOLOG FLEXPEN) 100 UNIT/ML FlexPen 20 Units. (Patient not taking: Reported on 02/03/2022)     Insulin Disposable Pump (OMNIPOD 5 G6 INTRO, GEN 5,) KIT 1 Device by Does not apply route every other day. 1 kit 0   Insulin Disposable Pump (OMNIPOD 5 G6 POD, GEN 5,) MISC 1 Device by Does not apply route every other day. 30 each 3   insulin glargine (LANTUS SOLOSTAR) 100 UNIT/ML Solostar Pen Inject 62 Units into the skin daily. 30 mL 0   Insulin Pen Needle (B-D UF III MINI PEN NEEDLES) 31G X 5 MM MISC USE AS DIRECTED WITH INSULIN EVERY MORNING, NOON, EVENING AND AT BEDTIME 400 each 3   Insulin Pen Needle 30G X 5 MM MISC 1 Device by Does not apply route in the morning, at noon, in the evening, and at bedtime. 400 each 3   ketoconazole (NIZORAL) 2 % cream Apply 1 Application topically daily. 60 g 0   levothyroxine (SYNTHROID) 150 MCG tablet Take 1 tablet (150 mcg total) by mouth daily. 90 tablet 3   losartan (COZAAR) 25 MG tablet Take 1 tablet (25 mg total) by mouth daily. 90 tablet 1   methocarbamol (ROBAXIN) 500  MG tablet Take 1 tablet (500 mg total) by mouth every 8 (eight) hours as needed for muscle spasms. 30 tablet 0   Multiple Vitamins-Minerals (MULTIVITAMIN WITH MINERALS) tablet Take 1 tablet by mouth daily.     mycophenolate (CELLCEPT) 500  MG tablet Take 1,500 mg by mouth 2 (two) times daily.     promethazine-dextromethorphan (PROMETHAZINE-DM) 6.25-15 MG/5ML syrup Take 5 mLs by mouth 4 (four) times daily as needed for cough. 180 mL 0   Pyridoxine HCl (VITAMIN B-6 PO) Take by mouth daily.     Semaglutide, 1 MG/DOSE, (OZEMPIC, 1 MG/DOSE,) 4 MG/3ML SOPN Inject 1 mg into the skin once a week. 3 mL 5   UNABLE TO FIND 1 each by Does not apply route daily. Med Name: POWER WHEELCHAIR  DX CODE-R26.89 1 each 0   UNABLE TO FIND 1 each by Does not apply route daily. Med Name: POWER SCOOTER DX CODE-R26.89 1 each 0   VITAMIN D PO Take by mouth daily.     No facility-administered medications prior to visit.    Allergies  Allergen Reactions   Codeine Swelling   Influenza A (H1n1) Monoval Pf Anaphylaxis   Hydrocodone-Acetaminophen Other (See Comments)   Lisinopril Other (See Comments)   Metformin Other (See Comments)   Pregabalin Other (See Comments)   Jardiance [Empagliflozin] Other (See Comments)    dehydration    ROS Review of Systems  Constitutional:  Negative for chills and fever.  HENT:  Negative for congestion, sinus pressure and sinus pain.   Respiratory:  Negative for cough, shortness of breath and wheezing.   Cardiovascular:  Negative for chest pain and palpitations.  Gastrointestinal:  Positive for abdominal pain. Negative for diarrhea, nausea and vomiting.  Genitourinary:  Negative for dysuria and hematuria.  Musculoskeletal:  Positive for arthralgias, back pain, gait problem and neck pain.  Skin:  Negative for rash.  Neurological:  Positive for weakness. Negative for dizziness.  Psychiatric/Behavioral:  Negative for agitation and behavioral problems.       Objective:    Physical Exam Vitals reviewed.  Constitutional:      General: She is not in acute distress.    Appearance: She is obese. She is not diaphoretic.  HENT:     Head: Normocephalic and atraumatic.     Nose: No congestion.     Mouth/Throat:      Mouth: Mucous membranes are moist.  Eyes:     General: No scleral icterus.    Extraocular Movements: Extraocular movements intact.  Cardiovascular:     Rate and Rhythm: Normal rate and regular rhythm.     Pulses: Normal pulses.     Heart sounds: Normal heart sounds. No murmur heard. Pulmonary:     Breath sounds: Normal breath sounds. No wheezing or rales.  Abdominal:     Palpations: Abdomen is soft.     Tenderness: There is abdominal tenderness (LUQ). There is no right CVA tenderness or left CVA tenderness.  Musculoskeletal:     Cervical back: Neck supple. No tenderness.     Right lower leg: No edema.     Left lower leg: No edema.  Skin:    General: Skin is warm.     Findings: No rash.  Neurological:     General: No focal deficit present.     Mental Status: She is alert and oriented to person, place, and time.     Sensory: Sensory deficit present.     Motor: Weakness (4/5 in b/l LE) present.  Gait: Gait abnormal.  Psychiatric:        Mood and Affect: Mood is depressed.        Behavior: Behavior normal.     BP 131/68 (BP Location: Right Arm, Patient Position: Sitting, Cuff Size: Large)   Pulse 90   Ht 5\' 1"  (1.549 m)   Wt 254 lb (115.2 kg)   SpO2 94%   BMI 47.99 kg/m  Wt Readings from Last 3 Encounters:  07/29/22 254 lb (115.2 kg)  07/29/22 254 lb 12.8 oz (115.6 kg)  07/05/22 249 lb 12.8 oz (113.3 kg)    Lab Results  Component Value Date   TSH 4.28 12/14/2021   Lab Results  Component Value Date   WBC 3.3 (L) 07/29/2022   HGB 12.6 07/29/2022   HCT 40.8 07/29/2022   MCV 92.9 07/29/2022   PLT 72 (L) 07/29/2022   Lab Results  Component Value Date   NA 142 07/29/2022   K 4.6 07/29/2022   CHLORIDE 104 10/06/2016   CO2 29 07/29/2022   GLUCOSE 172 (H) 07/29/2022   BUN 19 07/29/2022   CREATININE 0.88 07/29/2022   BILITOT 0.8 07/29/2022   ALKPHOS 106 07/29/2022   AST 27 07/29/2022   ALT 15 07/29/2022   PROT 7.1 07/29/2022   ALBUMIN 3.8 07/29/2022    CALCIUM 10.2 07/29/2022   ANIONGAP 7 07/29/2022   EGFR 50 (L) 10/06/2016   GFR 50.27 (L) 08/24/2019   Lab Results  Component Value Date   CHOL 171 06/09/2021   Lab Results  Component Value Date   HDL 63.30 06/09/2021   Lab Results  Component Value Date   LDLCALC 80 06/09/2021   Lab Results  Component Value Date   TRIG 137.0 06/09/2021   Lab Results  Component Value Date   CHOLHDL 3 06/09/2021   Lab Results  Component Value Date   HGBA1C 5.7 04/15/2022      Assessment & Plan:   Problem List Items Addressed This Visit       Cardiovascular and Mediastinum   Essential hypertension    BP Readings from Last 1 Encounters:  07/29/22 131/68  Well-controlled with Losartan Counseled for compliance with the medications Advised DASH diet and moderate exercise/walking as tolerated      Esophageal varices in cirrhosis (HCC)    Noted on CT abdomen Possibly needs EGD Can add beta-blocker once her other medical conditions improve -she is undergoing treatment for recurrent breast cancer        Digestive   Cirrhosis of liver without ascites (HCC)    Noted on CT abdomen Has portal hypertension and esophageal varices as well Needs GI evaluation        Endocrine   Type 2 diabetes mellitus with diabetic polyneuropathy, with long-term current use of insulin (HCC)    Lab Results  Component Value Date   HGBA1C 5.7 04/15/2022  Well-controlled On Lantus and ISS - followed by Endocrinology On Ozempic and Farxiga Advised to follow diabetic diet On ARB Diabetic eye exam: Advised to follow up with Ophthalmology for diabetic eye exam      Relevant Orders   Urine Microalbumin w/creat. ratio     Genitourinary   Urinary bladder disorder    CT abdomen revealed gas in the urinary bladder, no recent instrumentation Denies any dysuria or hematuria Referred to urology      Relevant Orders   Ambulatory referral to Urology   UA/M w/rflx Culture, Routine     Hematopoietic  and Hemostatic  Thrombocytopenia (HCC)    Likely from cirrhosis of liver Followed by hematology oncology Needs follow-up with GI        Other   Morbid obesity (HCC)    BMI Readings from Last 3 Encounters:  07/29/22 47.99 kg/m  07/29/22 48.14 kg/m  07/05/22 47.20 kg/m    Diet modification and ambulation as tolerated On Ozempic for type 2 DM      Splenomegaly    Noted on CT abdomen Also has liver cirrhosis and esophageal varices -needs GI follow-up Referred to general surgery as she has persistent upper quadrant abdominal pain, could be from splenomegaly as well      LUQ abdominal pain    Left upper quadrant abdominal pain, with tender bulging -likely splenomegaly CT abdomen showed periumbilical hernia with fat stranding and collateral vessels-referred to general surgery Advised to avoid bending and heavy lifting Has Zofran as needed for nausea      Paraumbilical hernia - Primary    Noted on CT abdomen Usually benign, but she has constant left upper quadrant abdominal pain Since it has fat stranding and collateral portosystemic blood vessels, referred to general surgery      Relevant Orders   Ambulatory referral to General Surgery    No orders of the defined types were placed in this encounter.   Follow-up: Return in about 4 months (around 11/28/2022), or if symptoms worsen or fail to improve.    Anabel Halon, MD

## 2022-07-29 NOTE — Assessment & Plan Note (Signed)
Noted on CT abdomen Also has liver cirrhosis and esophageal varices -needs GI follow-up Referred to general surgery as she has persistent upper quadrant abdominal pain, could be from splenomegaly as well

## 2022-07-29 NOTE — Assessment & Plan Note (Addendum)
Noted on CT abdomen Possibly needs EGD Can add beta-blocker once her other medical conditions improve -she is undergoing treatment for recurrent breast cancer

## 2022-07-29 NOTE — Assessment & Plan Note (Signed)
CT abdomen revealed gas in the urinary bladder, no recent instrumentation Denies any dysuria or hematuria Referred to urology

## 2022-07-29 NOTE — Assessment & Plan Note (Signed)
Noted on CT abdomen Usually benign, but she has constant left upper quadrant abdominal pain Since it has fat stranding and collateral portosystemic blood vessels, referred to general surgery

## 2022-07-29 NOTE — Progress Notes (Signed)
[facility]  Telephone:([phone]) [phone] Fax:(336) [phone]     ID: [name] [name] DOB: Meloche 03, 1952  MR#: [id number]  KZS#:[id number]  Patient Care Team: Anabel Halon, MD as PCP - General (Internal Medicine) Donnelly Angelica, RN as Oncology Nurse Navigator Pershing Proud, RN as Oncology Nurse Navigator Rachel Moulds, MD as Consulting Physician (Hematology and Oncology) Juanell Fairly, RN as Case Manager Antony Blackbird, MD as Consulting Physician (Radiation Oncology) Abigail Miyamoto, MD as Consulting Physician (General Surgery)   CHIEF COMPLAINT: Estrogen receptor positive invasive breast cancer  CURRENT TREATMENT: anastrozole  BREAST CANCER HISTORY:  Oncology History  Malignant neoplasm of upper-outer quadrant of left breast in female, estrogen receptor positive (HCC)  02/02/2015 - 02/2021 Anti-estrogen oral therapy   Anastrozole daily, completed 6 years   08/04/2015 Surgery   72 y.o. [name] woman status post left breast upper outer quadrant lumpectomy 08/04/2015 for a pT1c pNX, stage I A invasive ductal carcinoma, estrogen and progesterone receptor positive, HER-2 not amplified, with no HER-2 amplification, anterior margin was focally positive   08/04/2015 Oncotype testing   Oncotype DX score of 12 predicts a 10 year risk of recurrence outside the breast of 8% if the patient's only systemic therapy is tamoxifen for 5 years. It also predicts no benefit from therapy.      09/12/2015 Surgery   additional Left breast surgery 09/12/2015 cleared the margins and found all 5 axillary lymph nodes sampled clear for a final stage pT1c pN0, stage IA   10/22/2015 - 12/08/2015 Radiation Therapy   Adjuvant radiation 10/22/15 - 12/08/15              1) Left breast: 50.4 Gy in 28 fractions              2) Left breast boost: 10 Gy in 5 fractions   10/26/2021 Cancer Staging   Staging form: Breast, AJCC 7th Edition - Pathologic: Stage IA (T1c, N0, cM0) - Signed by Loa Socks, NP on 10/26/2021   Malignant neoplasm of right breast (HCC)  06/10/2021 Mammogram   asymmetry within the OUTER RIGHT breast on CC views warrants further evaluation.   Targeted ultrasound : showing hypoechoic oval mass with indistinct margins in the 10:30 o'clock location of the RIGHT breast 9 centimeters from nipple measuring 0.5 x 0.4 x 0.4 centimeters. This area is approximately 1.8 centimeters from the palpable bruise, bruise was thought to be fat necrosis.   06/23/2021 Initial Diagnosis   Right breast needle core biopsy IDC, grade one, ER 90% positive, PR 60% positive, Ki67 2%, HER-2 IHC 2+, FISH negative   07/08/2021 Treatment Plan Change   Faslodex every 4 weeks, Abemaciclib to be added    08/12/2021 Surgery   Right lumpectomy: invasive ductal carcinoma with clear margins of resection, final pathology showed tumor measuring 1.1 cm x 0.7 x 8.7,  grade 3   previous biopsy showed ER 90% positive, PR 60% positive, Ki-67 was 2% HER2 2+ by IHC and negative by [facility]   08/12/2021 Oncotype testing   12   09/22/2021 - 10/30/2021 Radiation Therapy   Site Technique Total Dose (Gy) Dose per Fx (Gy) Completed Fx Beam Energies  Breast, Right: Breast_R 3D 45/45 1.8 25/25 15X, 10XFFF  Breast, Right: Breast_R_Bst 3D 6/6 2 3/3 10X, 15X      Interval History  Patient is here for follow-up.   Since her last visit here, she says she has been hurting in her joints cannot sleep, feels miserable.  She  also has this painful hernia in the left chest wall and is trying to establish with a surgeon to see what can be done for it.  She is wondering if she can try a pill instead of this injection because she cannot fathom the fact that she needs this for 5 years.  No breast changes reported otherwise. Rest of the pertinent 10 point ROS reviewed and negative  PAST MEDICAL HISTORY: Past Medical History:  Diagnosis Date   Abdominal wall contusion 12/09/2011   Achilles tendon contracture, bilateral  06/03/2016   Anxiety    Arthritis    Blood dyscrasia    low platelet count- sees [name] , Dr. Nelva Nay ( Note in EPIC from 07/2014) at [facility]   Breast cancer [facility]) 2017   Left Breast   Breast cancer (HCC) 07/2021   right breast IDC   Cancer (HCC)    left breast   Chest wall contusion 12/09/2011   Cirrhosis of liver (HCC)    Cough    Diabetes mellitus without complication (HCC)    Diabetic neuropathy (HCC)    Diarrhea due to drug 01/29/2021   Dyspnea    Gout    Gout    History of radiation therapy 10/22/15 - 12/08/15   left breast 50.4 Gy, boost to 10 Gy   History of radiation therapy    Right breast- 09/22/21-10/30/21- Dr. 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   Neuromuscular disorder (HCC)    diabetic neuropathy   Neuropathy    Personal history of radiation therapy 2017   Left Breast Cancer   Posterior tibial tendon dysfunction (PTTD) of right lower extremity 10/09/2019   Sleep apnea    CPAP nightly   Spleen enlarged    Thyroid disease     PAST SURGICAL HISTORY: Past Surgical History:  Procedure Laterality Date   ANKLE FUSION Right 2022   BREAST BIOPSY Left 08/04/2015   Procedure: LEFT BREAST BIOPSY WITH NEEDLE LOCALIZATION;  Surgeon: Darnell Level, MD;  Location: [facility];  Service: General;  Laterality: Left;   BREAST BIOPSY Right 06/23/2021   BREAST DUCTAL SYSTEM EXCISION Left 08/04/2015   Procedure: LEFT EXCISION DUCTAL SYSTEM BREAST;  Surgeon: Darnell Level, MD;  Location: [facility];  Service: General;  Laterality: Left;   BREAST LUMPECTOMY Left 08/2015   BREAST LUMPECTOMY WITH AXILLARY LYMPH NODE BIOPSY Left 09/12/2015   Procedure: RE-EXCISION OF LEFT BREAST LUMPECTOMY WITH LEFT AXILLARY LYMPH NODE BIOPSY;  Surgeon: Darnell Level, MD;  Location: [facility];  Service: General;  Laterality: Left;   BREAST LUMPECTOMY WITH RADIOACTIVE  SEED LOCALIZATION Right 08/12/2021   Procedure: RIGHT BREAST LUMPECTOMY WITH RADIOACTIVE SEED LOCALIZATION;  Surgeon: Abigail Miyamoto, MD;  Location: [facility];  Service: General;  Laterality: Right;  LMA   CHOLECYSTECTOMY     EYE SURGERY Bilateral 2022   Lashes growing backwards into the eye   JOINT REPLACEMENT     left knee   JOINT REPLACEMENT  2017   right knee   REVERSE SHOULDER ARTHROPLASTY Right 09/07/2018   Procedure: RIGHT REVERSE SHOULDER ARTHROPLASTY;  Surgeon: Cammy Copa, MD;  Location: [facility];  Service: Orthopedics;  Laterality: Right;   TOTAL KNEE ARTHROPLASTY Right 02/28/2015   Procedure: RIGHT TOTAL KNEE ARTHROPLASTY;  Surgeon: Kathryne Hitch, MD;  Location: [facility];  Service: Orthopedics;  Laterality: Right;   TUBAL LIGATION  FAMILY HISTORY Family History  Problem Relation Age of Onset   Hypothyroidism Mother    Alzheimer's disease Mother    Diabetes Father    Cancer Sister        GYN cancer   Hypothyroidism Sister    Heart disease Brother    Arthritis Son    Diabetes Son    Arthritis Son    Cancer Maternal Grandmother    Heart failure Maternal Grandfather    Bone cancer Paternal Grandmother    Heart failure Paternal Grandmother   The patient's father died from an encephalitis at the age of 60. The patient's mother is living at age 67. The patient had one brother, 3 sisters. A paternal grandf mother had "bone cancer". A paternal aunt, present today also had "bone cancer", but on further questioning this was myeloma. The patient's sister Kendal Hymen was diagnosed with ovarian and uterine cancer and has been genetically tested but the test was negative   GYNECOLOGIC HISTORY:  No LMP recorded. Patient is postmenopausal. Menarche age 60 and first live birth age 57, menopause in her 54s. The patient never took hormone replacement or oral contraceptives.   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 Her husband [name] is a Music therapist. At home is just the 2 of them, the patient's aunt who is under hospice care for her myeloma, and the patient's dog. Son [name] Junior lives in [place] and works for an Oceanographer.Shari Heritage Fayrene Fearing to be also lives in [place] and is a Electronics engineer. Daughter Marda Stalker lives in [place] and works for the [employer] school system as F Estate manager/land agent. The patient has 5 grandchildren. She attends a [employer].    ADVANCED DIRECTIVES: In the absence of any documentation to the contrary, the patient's spouse is their HCPOA.    HEALTH MAINTENANCE: Social History   Tobacco Use   Smoking status: Never   Smokeless tobacco: Never  Vaping Use   Vaping Use: Never used  Substance Use Topics   Alcohol use: No   Drug use: No     Colonoscopy: 2015/Buccini  PAP:  Bone density:   Allergies  Allergen Reactions   Codeine Swelling   Influenza A (H1n1) Monoval Pf Anaphylaxis   Hydrocodone-Acetaminophen Other (See Comments)   Lisinopril Other (See Comments)   Metformin Other (See Comments)   Pregabalin Other (See Comments)   Jardiance [Empagliflozin] Other (See Comments)    dehydration    Current Outpatient Medications  Medication Sig Dispense Refill   ACCU-CHEK AVIVA PLUS test strip USE AS INSTRUCTED TO CHECK BLOOD SUGAR 2 TIMES DAILY (Patient not taking: Reported on 02/03/2022) 100 strip 5   acetaminophen (TYLENOL) 500 MG tablet Take by mouth.     albuterol (VENTOLIN HFA) 108 (90 Base) MCG/ACT inhaler Inhale 1-2 puffs into the lungs every 4 (four) hours as needed for wheezing or shortness of breath. 18 g 2   Ascorbic Acid (VITAMIN C PO) Take by mouth daily.     buPROPion (WELLBUTRIN XL) 150 MG 24 hr tablet TAKE 1 TABLET BY MOUTH EVERY DAY IN THE MORNING 90 tablet 1   citalopram (CELEXA) 40 MG tablet TAKE 1 TABLET BY MOUTH EVERY DAY 90 tablet 1   Cyanocobalamin (VITAMIN B-12 PO) Take  by mouth daily.     FARXIGA 10 MG TABS tablet TAKE 1 TABLET BY MOUTH DAILY BEFORE BREAKFAST. 90 tablet 3   fluticasone (FLONASE) 50  MCG/ACT nasal spray Place 1 spray into both nostrils as needed.      gabapentin (NEURONTIN) 600 MG tablet TAKE 1 TABLET IN THE MORNING AND 1 TABLET IN THE AFTERNOON AND 2TABS AT BEDTIME     guaiFENesin-codeine (CHERATUSSIN AC) 100-10 MG/5ML syrup Take 5 mLs by mouth 3 (three) times daily as needed for cough. 120 mL 0   insulin aspart (FIASP FLEXTOUCH) 100 UNIT/ML FlexTouch Pen Max daily 60 units (Patient taking differently: Max daily 62 units) 60 mL 3   insulin aspart (NOVOLOG FLEXPEN) 100 UNIT/ML FlexPen 20 Units. (Patient not taking: Reported on 02/03/2022)     Insulin Disposable Pump (OMNIPOD 5 G6 INTRO, GEN 5,) KIT 1 Device by Does not apply route every other day. 1 kit 0   Insulin Disposable Pump (OMNIPOD 5 G6 POD, GEN 5,) MISC 1 Device by Does not apply route every other day. 30 each 3   insulin glargine (LANTUS SOLOSTAR) 100 UNIT/ML Solostar Pen Inject 62 Units into the skin daily. 30 mL 0   Insulin Pen Needle (B-D UF III MINI PEN NEEDLES) 31G X 5 MM MISC USE AS DIRECTED WITH INSULIN EVERY MORNING, NOON, EVENING AND AT BEDTIME 400 each 3   Insulin Pen Needle 30G X 5 MM MISC 1 Device by Does not apply route in the morning, at noon, in the evening, and at bedtime. 400 each 3   ketoconazole (NIZORAL) 2 % cream Apply 1 Application topically daily. 60 g 0   levothyroxine (SYNTHROID) 150 MCG tablet Take 1 tablet (150 mcg total) by mouth daily. 90 tablet 3   losartan (COZAAR) 25 MG tablet Take 1 tablet (25 mg total) by mouth daily. 90 tablet 1   methocarbamol (ROBAXIN) 500 MG tablet Take 1 tablet (500 mg total) by mouth every 8 (eight) hours as needed for muscle spasms. 30 tablet 0   Multiple Vitamins-Minerals (MULTIVITAMIN WITH MINERALS) tablet Take 1 tablet by mouth daily.     mycophenolate (CELLCEPT) 500 MG tablet Take 1,500 mg by mouth 2 (two) times daily.      promethazine-dextromethorphan (PROMETHAZINE-DM) 6.25-15 MG/5ML syrup Take 5 mLs by mouth 4 (four) times daily as needed for cough. 180 mL 0   Pyridoxine HCl (VITAMIN B-6 PO) Take by mouth daily.     Semaglutide, 1 MG/DOSE, (OZEMPIC, 1 MG/DOSE,) 4 MG/3ML SOPN Inject 1 mg into the skin once a week. 3 mL 5   UNABLE TO FIND 1 each by Does not apply route daily. Med Name: POWER WHEELCHAIR  DX CODE-R26.89 1 each 0   UNABLE TO FIND 1 each by Does not apply route daily. Med Name: POWER SCOOTER DX CODE-R26.89 1 each 0   VITAMIN D PO Take by mouth daily.     No current facility-administered medications for this visit.    OBJECTIVE: White woman examined in a wheelchair  Vitals:   07/29/22 0933  BP: 136/64  Pulse: 77  Resp: 16  Temp: 97.9 F (36.6 C)  SpO2: 94%     Wt Readings from Last 3 Encounters:  07/29/22 254 lb 12.8 oz (115.6 kg)  07/05/22 249 lb 12.8 oz (113.3 kg)  05/19/22 256 lb 9.6 oz (116.4 kg)   Body mass index is 48.14 kg/m.    ECOG FS:2 - Symptomatic, <50% confined to bed  General appearance: Alert, oriented and in no acute distress, appears tired Neck: No cervical adenopathy Chest: Clear to auscultation bilaterally Heart: Rate and rhythm regular Abdomen: Soft, nontender nondistended.  There is a left chest  wall bulge, that this is the area of pain for her where apparently she was noted to have a hernia.  No flank tenderness.  No lower extremity swelling   LAB RESULTS:  CMP     Component Value Date/Time   NA 142 07/29/2022 0851   NA 138 07/05/2018 1635   NA 142 10/06/2016 1211   K 4.6 07/29/2022 0851   K 3.8 10/06/2016 1211   CL 106 07/29/2022 0851   CO2 29 07/29/2022 0851   CO2 25 10/06/2016 1211   GLUCOSE 172 (H) 07/29/2022 0851   GLUCOSE 179 (H) 10/06/2016 1211   BUN 19 07/29/2022 0851   BUN 19 07/05/2018 1635   BUN 16.9 10/06/2016 1211   CREATININE 0.88 07/29/2022 0851   CREATININE 1.09 (H) 09/28/2017 1516   CREATININE 1.1 10/06/2016 1211   CALCIUM  10.2 07/29/2022 0851   CALCIUM 10.4 10/06/2016 1211   PROT 7.1 07/29/2022 0851   PROT 7.5 07/05/2018 1635   PROT 7.6 10/06/2016 1211   ALBUMIN 3.8 07/29/2022 0851   ALBUMIN 4.6 07/05/2018 1635   ALBUMIN 3.9 10/06/2016 1211   AST 27 07/29/2022 0851   AST 33 10/06/2016 1211   ALT 15 07/29/2022 0851   ALT 20 10/06/2016 1211   ALKPHOS 106 07/29/2022 0851   ALKPHOS 86 10/06/2016 1211   BILITOT 0.8 07/29/2022 0851   BILITOT 0.52 10/06/2016 1211   GFRNONAA >60 07/29/2022 0851   GFRNONAA 52 (L) 09/28/2017 1516   GFRAA >60 11/07/2018 1133   GFRAA 61 09/28/2017 1516    INo results found for: "SPEP", "UPEP"  Lab Results  Component Value Date   WBC 3.3 (L) 07/29/2022   NEUTROABS 2.3 07/29/2022   HGB 12.6 07/29/2022   HCT 40.8 07/29/2022   MCV 92.9 07/29/2022   PLT 72 (L) 07/29/2022      Chemistry      Component Value Date/Time   NA 142 07/29/2022 0851   NA 138 07/05/2018 1635   NA 142 10/06/2016 1211   K 4.6 07/29/2022 0851   K 3.8 10/06/2016 1211   CL 106 07/29/2022 0851   CO2 29 07/29/2022 0851   CO2 25 10/06/2016 1211   BUN 19 07/29/2022 0851   BUN 19 07/05/2018 1635   BUN 16.9 10/06/2016 1211   CREATININE 0.88 07/29/2022 0851   CREATININE 1.09 (H) 09/28/2017 1516   CREATININE 1.1 10/06/2016 1211      Component Value Date/Time   CALCIUM 10.2 07/29/2022 0851   CALCIUM 10.4 10/06/2016 1211   ALKPHOS 106 07/29/2022 0851   ALKPHOS 86 10/06/2016 1211   AST 27 07/29/2022 0851   AST 33 10/06/2016 1211   ALT 15 07/29/2022 0851   ALT 20 10/06/2016 1211   BILITOT 0.8 07/29/2022 0851   BILITOT 0.52 10/06/2016 1211       No results found for: "LABCA2"  No components found for: "LABCA125"  No results for input(s): "INR" in the last 168 hours.  Urinalysis    Component Value Date/Time   COLORURINE YELLOW 08/30/2018 1357   APPEARANCEUR CLEAR 08/30/2018 1357   LABSPEC 1.024 08/30/2018 1357   PHURINE 5.0 08/30/2018 1357   GLUCOSEU >=500 (A) 08/30/2018 1357    HGBUR NEGATIVE 08/30/2018 1357   BILIRUBINUR NEGATIVE 08/30/2018 1357   KETONESUR NEGATIVE 08/30/2018 1357   PROTEINUR NEGATIVE 08/30/2018 1357   UROBILINOGEN 0.2 12/09/2011 1907   NITRITE NEGATIVE 08/30/2018 1357   LEUKOCYTESUR NEGATIVE 08/30/2018 1357    STUDIES: CT Abdomen Pelvis W Contrast  Result Date:  07/11/2022 CLINICAL DATA:  One month of left upper quadrant abdominal pain with reported bulge in that area. History of left breast cancer. EXAM: CT ABDOMEN AND PELVIS WITH CONTRAST TECHNIQUE: Multidetector CT imaging of the abdomen and pelvis was performed using the standard protocol following bolus administration of intravenous contrast. RADIATION DOSE REDUCTION: This exam was performed according to the departmental dose-optimization program which includes automated exposure control, adjustment of the mA and/or kV according to patient size and/or use of iterative reconstruction technique. CONTRAST:  OMNIPAQUE IOHEXOL 300 MG/ML  SOLN COMPARISON:  CT September 26, 2021, nuclear medicine bone scan [place] 7, 2024 FINDINGS: Lower chest: No acute abnormality. Hepatobiliary: Cirrhotic hepatic morphology. No suspicious hepatic lesion. Gallbladder surgically absent. No biliary ductal dilation. Pancreas: Periampullary duodenal diverticulum. No pancreatic ductal dilation or evidence of acute inflammation. Spleen: Splenomegaly measuring 17.4 cm in maximum axial dimension. Adrenals/Urinary Tract: Bilateral adrenal glands appear normal. No hydronephrosis. No suspicious renal mass. Gas in the urinary bladder. Stomach/Bowel: Radiopaque enteric contrast material traverses the ileocecal valve. Stomach is minimally distended limiting evaluation. No pathologic dilation of large or small bowel. Colonic diverticulosis without findings of acute diverticulitis. Vascular/Lymphatic: Normal caliber abdominal aorta. Smooth IVC contours. Portal, splenic and superior mesenteric veins are patent. Abdominopelvic collateral vessels  including ascending esophageal and gastric varices. No pathologically enlarged abdominal or pelvic lymph nodes. Reproductive: Uterine leiomyomas.  No suspicious adnexal mass. Other: Mild retroperitoneal stranding. Moderate-size paraumbilical hernia contains fat and prominent portosystemic collateral vessels. Nodularity in the anterior abdominal wall likely reflects sequela of subcutaneous injections. Musculoskeletal: Multilevel degenerative changes spine. Degenerative change of the bilateral hips. No suspicious osseous lesion. IMPRESSION: 1. Cirrhotic hepatic morphology with sequela of portal hypertension including splenomegaly and abdominopelvic collateral vessels including ascending esophageal and gastric varices. No suspicious hepatic lesion. 2. Mild retroperitoneal stranding,nonspecific. In the setting of cirrhosis this likely reflects sequela of venous congestion. However, given patient's history of breast neoplasm retroperitoneal fibrosis is a pertinent differential consideration. Suggest attention on follow-up studies. 3. Moderate-size paraumbilical hernia contains fat and prominent portosystemic collateral vessels. 4. Gas in the urinary bladder, correlate with recent instrumentation. 5. Colonic diverticulosis without findings of acute diverticulitis. Electronically Signed   By: Maudry Mayhew M.D.   On: 07/11/2022 09:05     ELIGIBLE FOR AVAILABLE RESEARCH PROTOCOL: no  ASSESSMENT: 72 y.o. [name] status post left breast upper outer quadrant lumpectomy 08/04/2015 for a pT1c pNX, stage I A invasive ductal carcinoma, estrogen and progesterone receptor positive, HER-2 not amplified, with no HER-2 amplification.  (a) anterior margin was focally positive  (1) additional Left breast surgery 09/12/2015 cleared the margins and found all 5 axillary lymph nodes sampled clear for a final stage pT1c pN0, stage IA  (2) Oncotype DX score of 12 predicts a 10 year risk of recurrence outside the breast of  8% if the patient's only systemic therapy is tamoxifen for 5 years. It also predicts no benefit from therapy.   (3) Adjuvant radiation 10/22/15 - 12/08/15   1) Left breast: 50.4 Gy in 28 fractions              2) Left breast boost: 10 Gy in 5 fractions  (4) started  anastrozole 02/02/2015, completed 6 yrs.  (5) She has mammogram recently 06/20/2021, possible asymmetry within the OUTER RIGHT breast on CC views warrants further evaluation. Targeted ultrasound is performed, showing hypoechoic oval mass with indistinct margins in the 10:30 o'clock location of the RIGHT breast 9 centimeters from nipple measuring 0.5 x  0.4 x 0.4 centimeters. This area is approximately 1.8 centimeters from the palpable bruise, bruise was thought to be fat necrosis.  Right breast lumpectomy showed invasive ductal carcinoma with clear margins of resection, final pathology showed tumor measuring 1.1 cm x 0.7 x 8.7 grade 3 previous biopsy showed ER 90% positive, PR 60% positive, Ki-67 was 2% HER2 2+ by IHC and negative by FISH  PLAN:  She is here for follow-up.  She is currently on adjuvant Faslodex.    She however tells me that the Faslodex is causing her a lot of aches and pains, she is unable to sleep when she is not feeling well overall.  She does not want to proceed with Faslodex for the next 5 years.  She is hoping we can try another pill.  She was on anastrozole or just completed anastrozole when she was found to have this new breast cancer.  Hence we have talked about Ideal options being Faslodex versus tamoxifen.  However she is very sedentary hence she was worried about the risk of blood clots and opted to try Faslodex.  Today she would like to try something different but she is at the same time very reluctant to consider tamoxifen.  Hence although exemestane is not the ideal option here, after much discussion she would like to try the exemestane.  With regards to the left chest wall tenderness we have done a bone  density which did not show any evidence of metastatic disease.  She also had a CT abdomen done by her PCP which showed cirrhosis and some air in the bladder but she denies any evidence of recent instrumentation or symptoms or signs of concern for urinary tract infection or sepsis.  I have asked her to contact Dr. Allena Katz immediately to discuss further steps regarding these findings and she expressed understanding.  And I also sent an in basket message to Dr. Allena Katz.  At this time plan is to return to clinic in 3 months or sooner as needed. Total time spent 30 minutes   I connected with  Christe A Sturgell on 07/29/22 by a telephone application and verified that I am speaking with the correct person using two identifiers.   I discussed the limitations of evaluation and management by telemedicine. The patient expressed understanding and agreed to proceed.  *Total Encounter Time as defined by the Centers for Medicare and Medicaid Services includes, in addition to the face-to-face time of a patient visit (documented in the note above) non-face-to-face time: obtaining and reviewing outside history, ordering and reviewing medications, tests or procedures, care coordination (communications with other health care professionals or caregivers) and documentation in the medical record.

## 2022-07-29 NOTE — Patient Instructions (Signed)
You are being referred to General surgery for paraumbilical hernia.  You are being referred to Urology for gas in urinary bladder.

## 2022-07-29 NOTE — Assessment & Plan Note (Addendum)
Lab Results  Component Value Date   HGBA1C 5.7 04/15/2022   Well-controlled On Lantus and ISS - followed by Endocrinology On Ozempic and Farxiga Advised to follow diabetic diet On ARB Diabetic eye exam: Advised to follow up with Ophthalmology for diabetic eye exam

## 2022-07-29 NOTE — Assessment & Plan Note (Signed)
Likely from cirrhosis of liver Followed by hematology oncology Needs follow-up with GI

## 2022-07-30 ENCOUNTER — Encounter: Payer: Self-pay | Admitting: Urology

## 2022-07-30 ENCOUNTER — Other Ambulatory Visit: Payer: Medicare Other

## 2022-07-30 ENCOUNTER — Ambulatory Visit (INDEPENDENT_AMBULATORY_CARE_PROVIDER_SITE_OTHER): Payer: Medicare Other | Admitting: Urology

## 2022-07-30 VITALS — BP 116/68 | HR 85 | Temp 98.5°F

## 2022-07-30 DIAGNOSIS — N329 Bladder disorder, unspecified: Secondary | ICD-10-CM | POA: Diagnosis not present

## 2022-07-30 DIAGNOSIS — R9341 Abnormal radiologic findings on diagnostic imaging of renal pelvis, ureter, or bladder: Secondary | ICD-10-CM

## 2022-07-30 DIAGNOSIS — R8290 Unspecified abnormal findings in urine: Secondary | ICD-10-CM | POA: Diagnosis not present

## 2022-07-30 LAB — URINALYSIS, ROUTINE W REFLEX MICROSCOPIC
Bilirubin, UA: NEGATIVE
Ketones, UA: NEGATIVE
Leukocytes,UA: NEGATIVE
Nitrite, UA: POSITIVE — AB
RBC, UA: NEGATIVE
Specific Gravity, UA: 1.03 (ref 1.005–1.030)
Urobilinogen, Ur: 1 mg/dL (ref 0.2–1.0)
pH, UA: 5.5 (ref 5.0–7.5)

## 2022-07-30 LAB — MICROSCOPIC EXAMINATION: RBC, Urine: NONE SEEN /HPF (ref 0–2)

## 2022-07-30 LAB — BLADDER SCAN AMB NON-IMAGING

## 2022-07-30 LAB — UA/M W/RFLX CULTURE, ROUTINE
Bilirubin, UA: NEGATIVE
RBC, UA: NEGATIVE

## 2022-07-30 MED ORDER — AMOXICILLIN-POT CLAVULANATE 875-125 MG PO TABS
1.0000 | ORAL_TABLET | Freq: Two times a day (BID) | ORAL | 0 refills | Status: AC
Start: 2022-07-30 — End: 2022-08-06

## 2022-07-30 NOTE — Patient Instructions (Signed)
Recommendations regarding UTI prevention / management:  When UTI symptoms occur: Call urology office to request order for urine culture. We recommend waiting for urine culture result prior to use of any antibiotics.  For bladder pain/ burning with urination: Over the counter Pyridium (phenazopyridine) as needed (commonly known under the "AZO" brand). No more than 3 days consecutively at a time due to risk for methemoglobinemia, liver function issues, and bone health damage with long term use of Pyridium.  Routine use for UTI prevention: Adequate fluid intake (>1.5 liters/day) to flush out the urinary tract. - Go to the bathroom to urinate every 4-6 hours while awake to minimize urinary stasis / bacterial overgrowth in the bladder. - Proanthocyanidin (PAC) supplement 36 mg daily; must be soluble (insoluble form of PAC will be ineffective). Recommended brand: Ellura. This is an over-the-counter supplement (often must be found/ purchased online) supplement derived from cranberries with concentrated active component: Proanthocyanidin (PAC) 36 mg daily. Decreases bacterial adherence to bladder lining. Not recommended for patients with interstitial cystitis due to acidity. - Vitamin C supplement to acidify urine to minimize bacterial growth. Not recommended for patients with interstitial cystitis due to acidity. - Probiotic to maintain healthy vaginal microbiome to suppress bacteria at urethral opening. Brand recommendations: Feminine Balance (highest concentration of lactobacillus) or Hyperbiotic Pro 15.  Note for patients with diabetes: You Mcevoy read about D-mannose powder for UTI prevention. That is an over the-counter supplement which decreases bacterial adherence to bladder lining. I would NOT advise that for you as a person with diabetes due to its sugar content.

## 2022-07-30 NOTE — Progress Notes (Signed)
[name] [name] 1950/06/16 [id number]  History of Present Illness: [name] [name] is a 72 y.o. female who presents today as a new patient at [facility] [place]. All available relevant medical records have been reviewed.   Recent history: - CT abdomen/pelvis w/ contrast performed on 07/08/2022 for evaluation of "One month of left upper quadrant abdominal pain with reported bulge in that area". Per radiology report there were no GU stones, masses, or hydronephrosis. - She was seen by her PCP Dr. Allena Katz yesterday (07/29/2022) who referred her to urology for further evaluation of "gas in the urinary bladder" seen on that CT given that she has had no recent instrumentation. Urine microscopy at that visit was positive for 11-30 WBC/hpf and a few bacteria; no RBC. Urine culture result is currently pending. - Dr. Allena Katz referred her to General Surgery for CT finding of splenomegaly and paraumbilical hernia. He also referred her to GI for cirrhosis with portal hypertension, esophageal varices, and thrombocytopenia. She was also noted to have chronic constipation.  Today: She reports occasional dysuria; denies that at this time. She denies increased urinary urgency, frequency, gross hematuria, straining to void, or sensations of incomplete emptying.  She denies history of kidney stones.  She denies history of pyelonephritis.  She denies history of recent or recurrent UTI. She denies history of GU malignancy or pelvic radiation.   She denies history of diverticulitis. Denies fecaluria or pneumaturia.   Fall Screening: Do you usually have a device to assist in your mobility? Yes   Medications: Current Outpatient Medications  Medication Sig Dispense Refill   ACCU-CHEK AVIVA PLUS test strip USE AS INSTRUCTED TO CHECK BLOOD SUGAR 2 TIMES DAILY 100 strip 5   acetaminophen (TYLENOL) 500 MG tablet Take by mouth.     albuterol (VENTOLIN HFA) 108 (90 Base) MCG/ACT inhaler Inhale 1-2 puffs into the lungs  every 4 (four) hours as needed for wheezing or shortness of breath. 18 g 2   amoxicillin-clavulanate (AUGMENTIN) 875-125 MG tablet Take 1 tablet by mouth 2 (two) times daily for 7 days. 14 tablet 0   Ascorbic Acid (VITAMIN C PO) Take by mouth daily.     buPROPion (WELLBUTRIN XL) 150 MG 24 hr tablet TAKE 1 TABLET BY MOUTH EVERY DAY IN THE MORNING 90 tablet 1   Cyanocobalamin (VITAMIN B-12 PO) Take by mouth daily.     exemestane (AROMASIN) 25 MG tablet Take 1 tablet (25 mg total) by mouth daily after breakfast. 90 tablet 1   FARXIGA 10 MG TABS tablet TAKE 1 TABLET BY MOUTH DAILY BEFORE BREAKFAST. 90 tablet 3   fluticasone (FLONASE) 50 MCG/ACT nasal spray Place 1 spray into both nostrils as needed.      gabapentin (NEURONTIN) 600 MG tablet TAKE 1 TABLET IN THE MORNING AND 1 TABLET IN THE AFTERNOON AND 2TABS AT BEDTIME     guaiFENesin-codeine (CHERATUSSIN AC) 100-10 MG/5ML syrup Take 5 mLs by mouth 3 (three) times daily as needed for cough. 120 mL 0   insulin aspart (FIASP FLEXTOUCH) 100 UNIT/ML FlexTouch Pen Max daily 60 units (Patient taking differently: Max daily 62 units) 60 mL 3   insulin aspart (NOVOLOG FLEXPEN) 100 UNIT/ML FlexPen      insulin glargine (LANTUS SOLOSTAR) 100 UNIT/ML Solostar Pen Inject 62 Units into the skin daily. 30 mL 0   Insulin Pen Needle (B-D UF III MINI PEN NEEDLES) 31G X 5 MM MISC USE AS DIRECTED WITH INSULIN EVERY MORNING, NOON, EVENING AND AT BEDTIME 400 each 3  Insulin Pen Needle 30G X 5 MM MISC 1 Device by Does not apply route in the morning, at noon, in the evening, and at bedtime. 400 each 3   ketoconazole (NIZORAL) 2 % cream Apply 1 Application topically daily. 60 g 0   levothyroxine (SYNTHROID) 150 MCG tablet Take 1 tablet (150 mcg total) by mouth daily. 90 tablet 3   losartan (COZAAR) 25 MG tablet Take 1 tablet (25 mg total) by mouth daily. 90 tablet 1   methocarbamol (ROBAXIN) 500 MG tablet Take 1 tablet (500 mg total) by mouth every 8 (eight) hours as needed  for muscle spasms. 30 tablet 0   Multiple Vitamins-Minerals (MULTIVITAMIN WITH MINERALS) tablet Take 1 tablet by mouth daily.     mycophenolate (CELLCEPT) 500 MG tablet Take 1,500 mg by mouth 2 (two) times daily.     promethazine-dextromethorphan (PROMETHAZINE-DM) 6.25-15 MG/5ML syrup Take 5 mLs by mouth 4 (four) times daily as needed for cough. 180 mL 0   Pyridoxine HCl (VITAMIN B-6 PO) Take by mouth daily.     Semaglutide, 1 MG/DOSE, (OZEMPIC, 1 MG/DOSE,) 4 MG/3ML SOPN Inject 1 mg into the skin once a week. 3 mL 5   UNABLE TO FIND 1 each by Does not apply route daily. Med Name: POWER WHEELCHAIR  DX CODE-R26.89 1 each 0   UNABLE TO FIND 1 each by Does not apply route daily. Med Name: POWER SCOOTER DX CODE-R26.89 1 each 0   VITAMIN D PO Take by mouth daily.     No current facility-administered medications for this visit.    Allergies: Allergies  Allergen Reactions   Codeine Swelling   Influenza A (H1n1) Monoval Pf Anaphylaxis   Hydrocodone-Acetaminophen Other (See Comments)   Lisinopril Other (See Comments)   Metformin Other (See Comments)   Pregabalin Other (See Comments)   Jardiance [Empagliflozin] Other (See Comments)    dehydration    Past Medical History:  Diagnosis Date   Abdominal wall contusion 12/09/2011   Achilles tendon contracture, bilateral 06/03/2016   Anxiety    Arthritis    Blood dyscrasia    low platelet count- sees [name] , Dr. Nelva Nay ( Note in EPIC from 07/2014) at [facility]   Breast cancer [facility]) 2017   Left Breast   Breast cancer (HCC) 07/2021   right breast IDC   Cancer (HCC)    left breast   Chest wall contusion 12/09/2011   Cirrhosis of liver (HCC)    Cough    Diabetes mellitus without complication (HCC)    Diabetic neuropathy (HCC)    Diarrhea due to drug 01/29/2021   Dyspnea    Gout    Gout    History of radiation therapy 10/22/15 - 12/08/15   left breast 50.4 Gy, boost to 10 Gy   History of radiation therapy    Right  breast- 09/22/21-10/30/21- Dr. 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   Neuromuscular disorder (HCC)    diabetic neuropathy   Neuropathy    Personal history of radiation therapy 2017   Left Breast Cancer   Posterior tibial tendon dysfunction (PTTD) of right lower extremity 10/09/2019   Sleep apnea    CPAP nightly   Spleen enlarged    Thyroid disease    Past Surgical History:  Procedure Laterality Date   ANKLE FUSION Right 2022   BREAST BIOPSY Left 08/04/2015   Procedure: LEFT BREAST BIOPSY WITH NEEDLE LOCALIZATION;  Surgeon: Darnell Level, MD;  Location: [facility];  Service: General;  Laterality: Left;   BREAST BIOPSY Right 06/23/2021   BREAST DUCTAL SYSTEM EXCISION Left 08/04/2015   Procedure: LEFT EXCISION DUCTAL SYSTEM BREAST;  Surgeon: Darnell Level, MD;  Location: [facility];  Service: General;  Laterality: Left;   BREAST LUMPECTOMY Left 08/2015   BREAST LUMPECTOMY WITH AXILLARY LYMPH NODE BIOPSY Left 09/12/2015   Procedure: RE-EXCISION OF LEFT BREAST LUMPECTOMY WITH LEFT AXILLARY LYMPH NODE BIOPSY;  Surgeon: Darnell Level, MD;  Location: [facility];  Service: General;  Laterality: Left;   BREAST LUMPECTOMY WITH RADIOACTIVE SEED LOCALIZATION Right 08/12/2021   Procedure: RIGHT BREAST LUMPECTOMY WITH RADIOACTIVE SEED LOCALIZATION;  Surgeon: Abigail Miyamoto, MD;  Location: [facility];  Service: General;  Laterality: Right;  LMA   CHOLECYSTECTOMY     EYE SURGERY Bilateral 2022   Lashes growing backwards into the eye   JOINT REPLACEMENT     left knee   JOINT REPLACEMENT  2017   right knee   REVERSE SHOULDER ARTHROPLASTY Right 09/07/2018   Procedure: RIGHT REVERSE SHOULDER ARTHROPLASTY;  Surgeon: Cammy Copa, MD;  Location: [facility];  Service: Orthopedics;  Laterality: Right;   TOTAL KNEE ARTHROPLASTY Right 02/28/2015   Procedure: RIGHT TOTAL KNEE  ARTHROPLASTY;  Surgeon: Kathryne Hitch, MD;  Location: [facility];  Service: Orthopedics;  Laterality: Right;   TUBAL LIGATION     Family History  Problem Relation Age of Onset   Hypothyroidism Mother    Alzheimer's disease Mother    Diabetes Father    Cancer Sister        GYN cancer   Hypothyroidism Sister    Heart disease Brother    Arthritis Son    Diabetes Son    Arthritis Son    Cancer Maternal Grandmother    Heart failure Maternal Grandfather    Bone cancer Paternal Grandmother    Heart failure Paternal Grandmother    Social History   Socioeconomic History   Marital status: Married    Spouse name: edward   Number of children: 3   Years of education: 12th   Highest education level: Not on file  Occupational History   Occupation: Retired    Comment: Dance movement psychotherapist  Tobacco Use   Smoking status: Never   Smokeless tobacco: Never  Building services engineer Use: Never used  Substance and Sexual Activity   Alcohol use: No   Drug use: No   Sexual activity: Yes    Birth control/protection: Surgical    Comment: BTL  Other Topics Concern   Not on file  Social History Narrative   Not on file   Social Determinants of Health   Financial Resource Strain: Low Risk  (02/11/2021)   Overall Financial Resource Strain (CARDIA)    Difficulty of Paying Living Expenses: Not very hard  Food Insecurity: No Food Insecurity (08/27/2021)   Hunger Vital Sign    Worried About Running Out of Food in the Last Year: Never true    Ran Out of Food in the Last Year: Never true  Transportation Needs: No Transportation Needs (08/27/2021)   PRAPARE - Administrator, Civil Service (Medical): No    Lack of Transportation (Non-Medical): No  Physical Activity: Insufficiently Active (02/11/2021)   Exercise Vital Sign    Days of Exercise per Week: 3 days    Minutes of Exercise per Session: 30 min  Stress: No Stress  Concern Present (02/11/2021)   [employer] of Occupational  Health - Occupational Stress Questionnaire    Feeling of Stress : Only a little  Social Connections: Moderately Integrated (02/11/2021)   Social Connection and Isolation Panel [NHANES]    Frequency of Communication with Friends and Family: Three times a week    Frequency of Social Gatherings with Friends and Family: Never    Attends Religious Services: More than 4 times per year    Active Member of [employer] or Organizations: No    Attends Banker Meetings: Never    Marital Status: Married  Catering manager Violence: Not At Risk (02/11/2021)   Humiliation, Afraid, Rape, and Kick questionnaire    Fear of Current or Ex-Partner: No    Emotionally Abused: No    Physically Abused: No    Sexually Abused: No    SUBJECTIVE  Review of Systems Constitutional: Patient denies any unintentional weight loss or change in strength lntegumentary: Patient denies any rashes or pruritus Cardiovascular: Patient denies chest pain or syncope Respiratory: Patient denies shortness of breath Gastrointestinal: Patient reports significant abdominal pain (chronic) Musculoskeletal: Patient reports back pain Neurologic: Patient denies convulsions or seizures Psychiatric: Patient denies memory problems Allergic/Immunologic: Patient denies recent allergic reaction(s) Hematologic/Lymphatic: Patient denies bleeding tendencies Endocrine: Patient denies heat/cold intolerance  GU: As per HPI.  OBJECTIVE Vitals:   07/30/22 0927  BP: 116/68  Pulse: 85  Temp: 98.5 F (36.9 C)   There is no height or weight on file to calculate BMI.  Physical Examination  Constitutional: No obvious distress; patient is non-toxic appearing  Cardiovascular: No visible lower extremity edema.  Respiratory: The patient does not have audible wheezing/stridor; respirations do not appear labored  Gastrointestinal: Obese abdomen  Musculoskeletal: Normal ROM of UEs  Skin: No obvious rashes/open sores  Neurologic: CN 2-12  grossly intact Psychiatric: Answered questions appropriately with normal affect  Hematologic/Lymphatic/Immunologic: No obvious bruises or sites of spontaneous bleeding  UA: positive for 6-10 WBC/hpf, moderate bacteria, hyaline casts PVR: >137 ml  ASSESSMENT Bladder disorder - Plan: Urinalysis, Routine w reflex microscopic, BLADDER SCAN AMB NON-IMAGING, US RENAL, amoxicillin-clavulanate (AUGMENTIN) 875-125 MG tablet  Abnormal urinalysis - Plan: US RENAL, amoxicillin-clavulanate (AUGMENTIN) 875-125 MG tablet  Abnormal CT scan, bladder - Plan: US RENAL, amoxicillin-clavulanate (AUGMENTIN) 875-125 MG tablet  Consulted with Dr. Ronne Binning. We considered possible etiologies for gas in bladder seen on CT including but not limited to: UTI, diverticulitis, fistula, etc. Patient is non-toxic appearing today; UA abnormal therefore will treat for suspected UTI with Augmentin as advised by Dr. Ronne Binning. We discussed her elevated risk for UTI secondary to glucosuria due to [place] use for her diabetic management.   Will plan for follow up in 4 weeks with renal / bladder US for recheck or sooner if needed. Pt verbalized understanding and agreement. All questions were answered.  PLAN Advised the following: Augmentin 2x/day for 1 week. Return in about 4 weeks (around 08/27/2022) for RUS, UA, PVR, & f/u with Evette Georges NP.  Orders Placed This Encounter  Procedures   Microscopic Examination   US RENAL    Standing Status:   Future    Standing Expiration Date:   07/30/2023    Order Specific Question:   Reason for Exam (SYMPTOM  OR DIAGNOSIS REQUIRED)    Answer:   kidney stone known or suspected    Order Specific Question:   Preferred imaging location?    Answer:   [facility]   Urinalysis, Routine w reflex microscopic  BLADDER SCAN AMB NON-IMAGING    It has been explained that the patient is to follow regularly with their PCP in addition to all other providers involved in their care and to  follow instructions provided by these respective offices. Patient advised to contact urology clinic if any urologic-pertaining questions, concerns, new symptoms or problems arise in the interim period.  Patient Instructions  Recommendations regarding UTI prevention / management:  When UTI symptoms occur: Call urology office to request order for urine culture. We recommend waiting for urine culture result prior to use of any antibiotics.  For bladder pain/ burning with urination: Over the counter Pyridium (phenazopyridine) as needed (commonly known under the "AZO" brand). No more than 3 days consecutively at a time due to risk for methemoglobinemia, liver function issues, and bone health damage with long term use of Pyridium.  Routine use for UTI prevention: Adequate fluid intake (>1.5 liters/day) to flush out the urinary tract. - Go to the bathroom to urinate every 4-6 hours while awake to minimize urinary stasis / bacterial overgrowth in the bladder. - Proanthocyanidin (PAC) supplement 36 mg daily; must be soluble (insoluble form of PAC will be ineffective). Recommended brand: Ellura. This is an over-the-counter supplement (often must be found/ purchased online) supplement derived from cranberries with concentrated active component: Proanthocyanidin (PAC) 36 mg daily. Decreases bacterial adherence to bladder lining. Not recommended for patients with interstitial cystitis due to acidity. - Vitamin C supplement to acidify urine to minimize bacterial growth. Not recommended for patients with interstitial cystitis due to acidity. - Probiotic to maintain healthy vaginal microbiome to suppress bacteria at urethral opening. Brand recommendations: Feminine Balance (highest concentration of lactobacillus) or Hyperbiotic Pro 15.  Note for patients with diabetes: You Alarie read about D-mannose powder for UTI prevention. That is an over the-counter supplement which decreases bacterial adherence to bladder  lining. I would NOT advise that for you as a person with diabetes due to its sugar content.   Electronically signed by:  Donnita Falls, MSN, FNP-C, CUNP 07/30/2022 1:22 PM

## 2022-07-31 LAB — MICROALBUMIN / CREATININE URINE RATIO: Creatinine, Urine: 171 mg/dL

## 2022-07-31 LAB — UA/M W/RFLX CULTURE, ROUTINE
Nitrite, UA: POSITIVE — AB
Specific Gravity, UA: >=1.03 — AB (ref 1.005–1.030)

## 2022-07-31 LAB — MICROSCOPIC EXAMINATION

## 2022-07-31 LAB — URINE CULTURE, REFLEX

## 2022-08-02 LAB — MICROALBUMIN / CREATININE URINE RATIO: Microalb/Creat Ratio: 337 mg/g creat — ABNORMAL HIGH (ref 0–29)

## 2022-08-02 LAB — URINE CULTURE, REFLEX

## 2022-08-02 LAB — UA/M W/RFLX CULTURE, ROUTINE: Urobilinogen, Ur: 0.2 mg/dL (ref 0.2–1.0)

## 2022-08-02 LAB — MICROSCOPIC EXAMINATION: Casts: NONE SEEN /LPF

## 2022-08-04 LAB — UA/M W/RFLX CULTURE, ROUTINE
Leukocytes,UA: NEGATIVE
pH, UA: 5.5 (ref 5.0–7.5)

## 2022-08-04 LAB — URINE CULTURE, REFLEX

## 2022-08-04 LAB — MICROALBUMIN / CREATININE URINE RATIO: Microalbumin, Urine: 576.8 ug/mL

## 2022-08-09 ENCOUNTER — Ambulatory Visit (INDEPENDENT_AMBULATORY_CARE_PROVIDER_SITE_OTHER): Payer: Medicare Other | Admitting: Gastroenterology

## 2022-08-09 ENCOUNTER — Encounter (INDEPENDENT_AMBULATORY_CARE_PROVIDER_SITE_OTHER): Payer: Self-pay | Admitting: Gastroenterology

## 2022-08-09 VITALS — BP 133/70 | HR 91 | Temp 97.8°F | Ht 61.0 in | Wt 246.8 lb

## 2022-08-09 DIAGNOSIS — R1012 Left upper quadrant pain: Secondary | ICD-10-CM | POA: Diagnosis not present

## 2022-08-09 DIAGNOSIS — R935 Abnormal findings on diagnostic imaging of other abdominal regions, including retroperitoneum: Secondary | ICD-10-CM | POA: Insufficient documentation

## 2022-08-09 DIAGNOSIS — K7460 Unspecified cirrhosis of liver: Secondary | ICD-10-CM | POA: Diagnosis not present

## 2022-08-09 NOTE — Progress Notes (Addendum)
[name] [name], M.D. Gastroenterology & Hepatology [facility] Gastroenterology [address] [place], [place] [address] Primary Care Physician: [name] Halon, MD [address] [place] [address]  Referring MD: PCP  Chief Complaint:  Cirrhosis  History of Present Illness: [name] [name] is a 72 y.o. female with past medical history of NASH cirrhosis, breast cancer status post right lumpectomy and chemotherapy, hypothyroidism, OSA, diabetes placated by neuropathy, gout, hypertension, who presents for evaluation of cirrhosis.  Patient was being evaluated by her PCP for left upper quadrant abdominal pain exacerbated with movements.  Esophagogastroduodenospy she underwent a CT of the abdomen pelvis with IV contrast on 07/08/2022 that showed cirrhotic hepatic morphology with associated splenomegaly and collateral vessels with presence of ascending esophageal and gastric varices, there was presence of mild retroperitoneal stranding (possibly related to venous congestion), a periumbilical hernia.  Patient was referred to our clinic for evaluation of cirrhosis.  Patient reports that for the last couple of months she has presented persistent pain in her LUQ. She reports that she had a respiratory infection (possibly the flu) prior to her symptoms started, and she developed the pain in her LUQ. She reports that she has pain in the LUQ when she moves or leans on her side. Occasionally has nausea when she has the abdominal pain. States that occasionally she gets bloated and gassy, for which she takes gas.  The patient denies having any vomiting, fever, chills, hematochezia, melena, hematemesis, abdominal distention, diarrhea, jaundice, pruritus or weight loss.  Most recent blood workup from 07/29/2022 showed CMP with AST 27, ALT 15, total bilirubin 0.8, albumin 3.8, alkaline phosphatase at 106, sodium 142, potassium 4.6, creatinine 0.88, CBC with WBC 3.3, hemoglobin 12.6,  platelets 70,000.  Patient reports that she was diagnosed with liver cirrhosis by Dr. Matthias [name] in [place] close to 8 years ago. She has not seen him since 2018 as he retired. She reports she was told cirrhosis was related to NASH.  Cirrhosis related questions: Hematemesis/coffee ground emesis: No Abdominal pain: Yes Abdominal distention/worsening ascitesNo Fever/chills: No Episodes of confusion/disorientation: No Taking diuretics?: No History of variceal bleeding: No Prior history of banding?: No Prior episodes of SBP: No Last time liver imaging was performed: CT abdomen as described above Last AFP: not available MELD 3.0 score: cannot calculate Currently consuming alcohol: Yes  Last QQV:ZDGLO Last Colonoscopy:2017, patient reports she had some polyps but no reports are available  FHx: neg for any gastrointestinal/liver disease, no malignancies Social: neg smoking, alcohol or illicit drug use Surgical: abdominal wall cyst, cholecystectomy  Past Medical History: Past Medical History:  Diagnosis Date   Abdominal wall contusion 12/09/2011   Achilles tendon contracture, bilateral 06/03/2016   Anxiety    Arthritis    Blood dyscrasia    low platelet count- sees [name] , Dr. Nelva [name] ( Note in EPIC from 07/2014) at [facility]   Breast cancer [facility]) 2017   Left Breast   Breast cancer (HCC) 07/2021   right breast IDC   Cancer (HCC)    left breast   Chest wall contusion 12/09/2011   Cirrhosis of liver (HCC)    Cough    Diabetes mellitus without complication (HCC)    Diabetic neuropathy (HCC)    Diarrhea due to drug 01/29/2021   Dyspnea    Gout    Gout    History of radiation therapy 10/22/15 - 12/08/15   left breast 50.4 Gy, boost to 10 Gy   History of radiation  therapy    Right breast- 09/22/21-10/30/21- Dr. 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   Neuromuscular disorder (HCC)     diabetic neuropathy   Neuropathy    Personal history of radiation therapy 2017   Left Breast Cancer   Posterior tibial tendon dysfunction (PTTD) of right lower extremity 10/09/2019   Sleep apnea    CPAP nightly   Spleen enlarged    Thyroid disease     Past Surgical History: Past Surgical History:  Procedure Laterality Date   ANKLE FUSION Right 2022   BREAST BIOPSY Left 08/04/2015   Procedure: LEFT BREAST BIOPSY WITH NEEDLE LOCALIZATION;  Surgeon: Darnell Level, MD;  Location: [facility];  Service: General;  Laterality: Left;   BREAST BIOPSY Right 06/23/2021   BREAST DUCTAL SYSTEM EXCISION Left 08/04/2015   Procedure: LEFT EXCISION DUCTAL SYSTEM BREAST;  Surgeon: Darnell Level, MD;  Location: [facility];  Service: General;  Laterality: Left;   BREAST LUMPECTOMY Left 08/2015   BREAST LUMPECTOMY WITH AXILLARY LYMPH NODE BIOPSY Left 09/12/2015   Procedure: RE-EXCISION OF LEFT BREAST LUMPECTOMY WITH LEFT AXILLARY LYMPH NODE BIOPSY;  Surgeon: Darnell Level, MD;  Location: [facility];  Service: General;  Laterality: Left;   BREAST LUMPECTOMY WITH RADIOACTIVE SEED LOCALIZATION Right 08/12/2021   Procedure: RIGHT BREAST LUMPECTOMY WITH RADIOACTIVE SEED LOCALIZATION;  Surgeon: Abigail Miyamoto, MD;  Location: [facility];  Service: General;  Laterality: Right;  LMA   CHOLECYSTECTOMY     EYE SURGERY Bilateral 2022   Lashes growing backwards into the eye   JOINT REPLACEMENT     left knee   JOINT REPLACEMENT  2017   right knee   REVERSE SHOULDER ARTHROPLASTY Right 09/07/2018   Procedure: RIGHT REVERSE SHOULDER ARTHROPLASTY;  Surgeon: Cammy Copa, MD;  Location: [facility];  Service: Orthopedics;  Laterality: Right;   TOTAL KNEE ARTHROPLASTY Right 02/28/2015   Procedure: RIGHT TOTAL KNEE ARTHROPLASTY;  Surgeon: Kathryne Hitch, MD;  Location: [facility];  Service: Orthopedics;  Laterality: Right;   TUBAL LIGATION      Family  History: Family History  Problem Relation Age of Onset   Hypothyroidism Mother    Alzheimer's disease Mother    Diabetes Father    Cancer Sister        GYN cancer   Hypothyroidism Sister    Heart disease Brother    Arthritis Son    Diabetes Son    Arthritis Son    Cancer Maternal Grandmother    Heart failure Maternal Grandfather    Bone cancer Paternal Grandmother    Heart failure Paternal Grandmother     Social History: Social History   Tobacco Use  Smoking Status Never  Smokeless Tobacco Never   Social History   Substance and Sexual Activity  Alcohol Use No   Social History   Substance and Sexual Activity  Drug Use No    Allergies: Allergies  Allergen Reactions   Codeine Swelling   Influenza A (H1n1) Monoval Pf Anaphylaxis   Hydrocodone-Acetaminophen Other (See Comments)   Lisinopril Other (See Comments)   Metformin Other (See Comments)   Pregabalin Other (See Comments)   Jardiance [Empagliflozin] Other (See Comments)    dehydration    Medications: Current Outpatient Medications  Medication Sig Dispense Refill   ACCU-CHEK AVIVA PLUS test strip USE AS INSTRUCTED TO CHECK BLOOD SUGAR 2 TIMES DAILY 100 strip 5  acetaminophen (TYLENOL) 500 MG tablet Take by mouth as needed.     albuterol (VENTOLIN HFA) 108 (90 Base) MCG/ACT inhaler Inhale 1-2 puffs into the lungs every 4 (four) hours as needed for wheezing or shortness of breath. 18 g 2   Ascorbic Acid (VITAMIN C PO) Take by mouth daily.     buPROPion (WELLBUTRIN XL) 150 MG 24 hr tablet TAKE 1 TABLET BY MOUTH EVERY DAY IN THE MORNING 90 tablet 1   Cyanocobalamin (VITAMIN B-12 PO) Take by mouth daily.     exemestane (AROMASIN) 25 MG tablet Take 1 tablet (25 mg total) by mouth daily after breakfast. 90 tablet 1   FARXIGA 10 MG TABS tablet TAKE 1 TABLET BY MOUTH DAILY BEFORE BREAKFAST. 90 tablet 3   fluticasone (FLONASE) 50 MCG/ACT nasal spray Place 1 spray into both nostrils as needed.      gabapentin  (NEURONTIN) 600 MG tablet TAKE 1 TABLET IN THE MORNING AND 1 TABLET IN THE AFTERNOON AND 2TABS AT BEDTIME     guaiFENesin-codeine (CHERATUSSIN AC) 100-10 MG/5ML syrup Take 5 mLs by mouth 3 (three) times daily as needed for cough. 120 mL 0   insulin glargine (LANTUS SOLOSTAR) 100 UNIT/ML Solostar Pen Inject 62 Units into the skin daily. 30 mL 0   Insulin Pen Needle (B-D UF III MINI PEN NEEDLES) 31G X 5 MM MISC USE AS DIRECTED WITH INSULIN EVERY MORNING, NOON, EVENING AND AT BEDTIME 400 each 3   Insulin Pen Needle 30G X 5 MM MISC 1 Device by Does not apply route in the morning, at noon, in the evening, and at bedtime. 400 each 3   ketoconazole (NIZORAL) 2 % cream Apply 1 Application topically daily. 60 g 0   levothyroxine (SYNTHROID) 150 MCG tablet Take 1 tablet (150 mcg total) by mouth daily. 90 tablet 3   losartan (COZAAR) 25 MG tablet Take 1 tablet (25 mg total) by mouth daily. 90 tablet 1   methocarbamol (ROBAXIN) 500 MG tablet Take 1 tablet (500 mg total) by mouth every 8 (eight) hours as needed for muscle spasms. 30 tablet 0   Multiple Vitamins-Minerals (MULTIVITAMIN WITH MINERALS) tablet Take 1 tablet by mouth daily.     mycophenolate (CELLCEPT) 500 MG tablet Take 1,500 mg by mouth 2 (two) times daily.     promethazine-dextromethorphan (PROMETHAZINE-DM) 6.25-15 MG/5ML syrup Take 5 mLs by mouth 4 (four) times daily as needed for cough. 180 mL 0   Pyridoxine HCl (VITAMIN B-6 PO) Take by mouth daily.     Semaglutide, 1 MG/DOSE, (OZEMPIC, 1 MG/DOSE,) 4 MG/3ML SOPN Inject 1 mg into the skin once a week. 3 mL 5   VITAMIN D PO Take by mouth daily.     UNABLE TO FIND 1 each by Does not apply route daily. Med Name: POWER WHEELCHAIR  DX KGMW-N02.72 (Patient not taking: Reported on 08/09/2022) 1 each 0   UNABLE TO FIND 1 each by Does not apply route daily. Med Name: POWER SCOOTER DX CODE-R26.89 (Patient not taking: Reported on 08/09/2022) 1 each 0   No current facility-administered medications for this  visit.    Review of Systems: GENERAL: negative for malaise, night sweats HEENT: No changes in hearing or vision, no nose bleeds or other nasal problems. NECK: Negative for lumps, goiter, pain and significant neck swelling RESPIRATORY: Negative for cough, wheezing CARDIOVASCULAR: Negative for chest pain, leg swelling, palpitations, orthopnea GI: SEE HPI MUSCULOSKELETAL: Negative for joint pain or swelling, back pain, and muscle pain. SKIN: Negative for  lesions, rash PSYCH: Negative for sleep disturbance, mood disorder and recent psychosocial stressors. HEMATOLOGY Negative for prolonged bleeding, bruising easily, and swollen nodes. ENDOCRINE: Negative for cold or heat intolerance, polyuria, polydipsia and goiter. NEURO: negative for tremor, gait imbalance, syncope and seizures. The remainder of the review of systems is noncontributory.   Physical Exam: BP 133/70 (BP Location: Left Arm, Patient Position: Sitting, Cuff Size: Large)   Pulse 91   Temp 97.8 F (36.6 C) (Temporal)   Ht 5\' 1"  (1.549 m)   Wt 246 lb 12.8 oz (111.9 kg)   BMI 46.63 kg/m  GENERAL: The patient is AO x3, in no acute distress. HEENT: Head is normocephalic and atraumatic. EOMI are intact. Mouth is well hydrated and without lesions. NECK: Supple. No masses LUNGS: Clear to auscultation. No presence of rhonchi/wheezing/rales. Adequate chest expansion HEART: RRR, normal s1 and s2. ABDOMEN:tender to palpation in the epigastric area and left upper quadrant, no guarding, no peritoneal signs, and nondistended. BS +. No masses. EXTREMITIES: Without any cyanosis, clubbing, rash, lesions or edema. NEUROLOGIC: AOx3, no focal motor deficit. SKIN: no jaundice, no rashes   Imaging/Labs: as above  I personally reviewed and interpreted the available labs, imaging and endoscopic files.  Impression and Plan: Cozetta A Mechling is a 72 y.o. female with past medical history of NASH cirrhosis, breast cancer status post right  lumpectomy and chemotherapy, hypothyroidism, OSA, diabetes placated by neuropathy, gout, hypertension, who presents for evaluation of cirrhosis.  Patient has had a history of liver cirrhosis without decompensating events.  However, most recent imaging showed changes consistent with portal hypertension.  She also has presented worsening thrombocytopenia due to this.  Most likely her cirrhosis is related to NASH given her indolent disease as well as multiple comorbidities.  Her most recent LFTs were normal, so I doubt there is an autoimmune or viral component to it.  Will check MELD labs today.  In terms of his cirrhosis, she is due for variceal screening with an EGD.  This will also help evaluating her abdominal pain.  I consider her abdominal pain is likely musculoskeletal given the characteristics of the pain and the initial trigger for this occurrence.  I advised to do topical management and to take Tylenol as needed to decrease the pain.  Notably, her most recent cross-sectional abdominal imaging did not show any abnormalities that would explain the pain.  She is due for [facility] screening with an ultrasound and AFP, which will be ordered today.  Will also request the most recent workup performed by Dr. Matthias [name].  - Schedule EGD - Check CBC, MELD labs and AFP - Schedule liver US - Reduce salt intake to <2 g per day - Can take Tylenol max of 2 g per day (650 mg q8h) for pain - Avoid NSAIDs for pain - Avoid eating raw oysters/shellfish - Protein shake (Ensure or Boost) every night before going to sleep - Use lidocaine patches in area of abdominal pain every 12 hours, can also use heating pads -Avoid any brisk movements of your body -Will request procedure records from Dr. Molly Maduro Buccini's office  ADDENDUM: I received the results of Dr. Donavan Burnet office.   Colonoscopy was performed on 03/07/2014 which showed diverticulosis throughout the colon and friability in the ascending colon.  Advised to repeat  colonoscopy in 10 years. She was first diagnosed with liver cirrhosis on January 2016. --- Will recommend obtaining hepatitis A/B serologies in next appointment . All questions were answered.      Reuel Boom  Levon Hedger, MD Gastroenterology and Hepatology [facility] Gastroenterology

## 2022-08-09 NOTE — Patient Instructions (Addendum)
-   Schedule EGD - Check CBC, MELD labs and AFP - Schedule liver US - Reduce salt intake to <2 g per day - Can take Tylenol max of 2 g per day (650 mg q8h) for pain - Avoid NSAIDs for pain - Avoid eating raw oysters/shellfish - Protein shake (Ensure or Boost) every night before going to sleep - Use lidocaine patches in area of abdominal pain every 12 hours, can also use heating pads -Avoid any brisk movements of your body -Will request procedure records from Dr. Molly Maduro Buccini's office

## 2022-08-10 ENCOUNTER — Encounter (INDEPENDENT_AMBULATORY_CARE_PROVIDER_SITE_OTHER): Payer: Self-pay

## 2022-08-10 ENCOUNTER — Telehealth (INDEPENDENT_AMBULATORY_CARE_PROVIDER_SITE_OTHER): Payer: Self-pay | Admitting: Gastroenterology

## 2022-08-10 ENCOUNTER — Telehealth: Payer: Self-pay | Admitting: Internal Medicine

## 2022-08-10 LAB — COMPREHENSIVE METABOLIC PANEL
AG Ratio: 1.4 (calc) (ref 1.0–2.5)
ALT: 16 U/L (ref 6–29)
AST: 28 U/L (ref 10–35)
Albumin: 4.2 g/dL (ref 3.6–5.1)
Alkaline phosphatase (APISO): 102 U/L (ref 37–153)
BUN: 17 mg/dL (ref 7–25)
CO2: 27 mmol/L (ref 20–32)
Calcium: 10.4 mg/dL (ref 8.6–10.4)
Chloride: 106 mmol/L (ref 98–110)
Creat: 0.9 mg/dL (ref 0.60–1.00)
Globulin: 2.9 g/dL (calc) (ref 1.9–3.7)
Glucose, Bld: 119 mg/dL — ABNORMAL HIGH (ref 65–99)
Potassium: 3.9 mmol/L (ref 3.5–5.3)
Sodium: 142 mmol/L (ref 135–146)
Total Bilirubin: 0.7 mg/dL (ref 0.2–1.2)
Total Protein: 7.1 g/dL (ref 6.1–8.1)

## 2022-08-10 LAB — CBC WITH DIFFERENTIAL/PLATELET
Absolute Monocytes: 414 cells/uL (ref 200–950)
Basophils Absolute: 32 cells/uL (ref 0–200)
Basophils Relative: 0.7 %
Eosinophils Absolute: 149 cells/uL (ref 15–500)
Eosinophils Relative: 3.3 %
HCT: 41.2 % (ref 35.0–45.0)
Hemoglobin: 13.4 g/dL (ref 11.7–15.5)
Lymphs Abs: 1026 cells/uL (ref 850–3900)
MCH: 28.6 pg (ref 27.0–33.0)
MCHC: 32.5 g/dL (ref 32.0–36.0)
MCV: 87.8 fL (ref 80.0–100.0)
MPV: 11.5 fL (ref 7.5–12.5)
Monocytes Relative: 9.2 %
Neutro Abs: 2880 cells/uL (ref 1500–7800)
Neutrophils Relative %: 64 %
Platelets: 79 10*3/uL — ABNORMAL LOW (ref 140–400)
RBC: 4.69 10*6/uL (ref 3.80–5.10)
RDW: 15.3 % — ABNORMAL HIGH (ref 11.0–15.0)
Total Lymphocyte: 22.8 %
WBC: 4.5 10*3/uL (ref 3.8–10.8)

## 2022-08-10 LAB — PROTIME-INR
INR: 1.2 — ABNORMAL HIGH
Prothrombin Time: 13 s — ABNORMAL HIGH (ref 9.0–11.5)

## 2022-08-10 LAB — AFP TUMOR MARKER: AFP-Tumor Marker: 5.8 ng/mL

## 2022-08-10 NOTE — Telephone Encounter (Signed)
RUQ [place] scheduled for 08/19/22 at 7:30am. Pt to arrive at 7:15am. NPO after midnight. After the RUQ [place] she will need to sit in lobby and drink water to fill her bladder for renal US.   Will need to schedule EGD Room 3 when August schedule is out.    Left message to return call. Will send my chart message.

## 2022-08-10 NOTE — Telephone Encounter (Signed)
Pt returned call and verbalized understanding  

## 2022-08-10 NOTE — Telephone Encounter (Signed)
[employer] called in on patient behalf  Needs specifications on why patient needs Power wheelchair \ call back number [phone]

## 2022-08-11 NOTE — Telephone Encounter (Signed)
Faxed to [facility].

## 2022-08-12 ENCOUNTER — Telehealth: Payer: Self-pay | Admitting: Podiatry

## 2022-08-12 NOTE — Telephone Encounter (Signed)
Lmom for patient to call back to schedule picking up diabetic shoes  

## 2022-08-17 DIAGNOSIS — K7460 Unspecified cirrhosis of liver: Secondary | ICD-10-CM | POA: Diagnosis not present

## 2022-08-17 DIAGNOSIS — E118 Type 2 diabetes mellitus with unspecified complications: Secondary | ICD-10-CM | POA: Diagnosis not present

## 2022-08-17 DIAGNOSIS — E785 Hyperlipidemia, unspecified: Secondary | ICD-10-CM | POA: Diagnosis not present

## 2022-08-17 DIAGNOSIS — E11649 Type 2 diabetes mellitus with hypoglycemia without coma: Secondary | ICD-10-CM | POA: Diagnosis not present

## 2022-08-17 DIAGNOSIS — E039 Hypothyroidism, unspecified: Secondary | ICD-10-CM | POA: Diagnosis not present

## 2022-08-17 DIAGNOSIS — I10 Essential (primary) hypertension: Secondary | ICD-10-CM | POA: Diagnosis not present

## 2022-08-17 DIAGNOSIS — K7581 Nonalcoholic steatohepatitis (NASH): Secondary | ICD-10-CM | POA: Diagnosis not present

## 2022-08-17 DIAGNOSIS — E1142 Type 2 diabetes mellitus with diabetic polyneuropathy: Secondary | ICD-10-CM | POA: Diagnosis not present

## 2022-08-17 LAB — HEMOGLOBIN A1C: Hemoglobin A1C: 6

## 2022-08-19 ENCOUNTER — Ambulatory Visit (HOSPITAL_COMMUNITY)
Admission: RE | Admit: 2022-08-19 | Discharge: 2022-08-19 | Disposition: A | Discharge status: Home / Self Care | Payer: Medicare Other | Source: Ambulatory Visit | Attending: Urology | Admitting: Urology

## 2022-08-19 ENCOUNTER — Ambulatory Visit (HOSPITAL_COMMUNITY)
Admission: RE | Admit: 2022-08-19 | Discharge: 2022-08-19 | Disposition: A | Discharge status: Home / Self Care | Payer: Medicare Other | Source: Ambulatory Visit | Attending: Gastroenterology | Admitting: Gastroenterology

## 2022-08-19 DIAGNOSIS — R9341 Abnormal radiologic findings on diagnostic imaging of renal pelvis, ureter, or bladder: Secondary | ICD-10-CM | POA: Insufficient documentation

## 2022-08-19 DIAGNOSIS — N329 Bladder disorder, unspecified: Secondary | ICD-10-CM | POA: Diagnosis not present

## 2022-08-19 DIAGNOSIS — R8290 Unspecified abnormal findings in urine: Secondary | ICD-10-CM | POA: Insufficient documentation

## 2022-08-19 DIAGNOSIS — K7460 Unspecified cirrhosis of liver: Secondary | ICD-10-CM

## 2022-08-19 DIAGNOSIS — R932 Abnormal findings on diagnostic imaging of liver and biliary tract: Secondary | ICD-10-CM | POA: Diagnosis not present

## 2022-08-23 ENCOUNTER — Ambulatory Visit
Admission: RE | Admit: 2022-08-23 | Discharge: 2022-08-23 | Disposition: A | Discharge status: Home / Self Care | Payer: Medicare Other | Source: Ambulatory Visit | Attending: Hematology and Oncology | Admitting: Hematology and Oncology

## 2022-08-23 DIAGNOSIS — C50412 Malignant neoplasm of upper-outer quadrant of left female breast: Secondary | ICD-10-CM

## 2022-08-23 DIAGNOSIS — Z853 Personal history of malignant neoplasm of breast: Secondary | ICD-10-CM | POA: Diagnosis not present

## 2022-08-23 NOTE — Progress Notes (Unsigned)
Name: [name] [name] Mode DOB: Chesney 27, 1952 MRN: [id number]  History of Present Illness: [name] [name] is a 72 y.o. female who presents today for follow up visit at [facility].  Recent history:  > 07/29/2022:  - Urine microscopy at that visit was positive for 11-30 WBC/hpf and a few bacteria; no RBC.  - Urine culture result positive for Klebsiella pneumoniae.   > 07/30/2022:  - Seen by urology for further evaluation of "gas in the urinary bladder" seen on that CT with no recent instrumentation.  - She reports occasional dysuria; asymptomatic at visit.  - No history of stones or UTIs.  - Chronic constipation.  - Consulted with Dr. Ronne Binning. We considered possible etiologies for gas in bladder seen on CT including but not limited to: UTI, diverticulitis, fistula, etc.  - The plan was:  Augmentin 2x/day for 1 week. Return in about 4 weeks (around 08/27/2022) for RUS, UA, PVR, & f/u with Evette Georges NP.  Since last visit: 08/19/2022: RUS today: Awaiting radiology read; ***  Today: She reports ***  She {Actions; denies-reports:120008} increased urinary urgency, frequency, nocturia, dysuria, gross hematuria, hesitancy, straining to void, or sensations of incomplete emptying.   Fall Screening: Do you usually have a device to assist in your mobility? {yes/no:20286} ***cane / ***walker / ***wheelchair   Medications: Current Outpatient Medications  Medication Sig Dispense Refill   ACCU-CHEK AVIVA PLUS test strip USE AS INSTRUCTED TO CHECK BLOOD SUGAR 2 TIMES DAILY 100 strip 5   acetaminophen (TYLENOL) 500 MG tablet Take by mouth as needed.     albuterol (VENTOLIN HFA) 108 (90 Base) MCG/ACT inhaler Inhale 1-2 puffs into the lungs every 4 (four) hours as needed for wheezing or shortness of breath. 18 g 2   Ascorbic Acid (VITAMIN C PO) Take by mouth daily.     buPROPion (WELLBUTRIN XL) 150 MG 24 hr tablet TAKE 1 TABLET BY MOUTH EVERY DAY IN THE MORNING 90 tablet 1   Cyanocobalamin  (VITAMIN B-12 PO) Take by mouth daily.     exemestane (AROMASIN) 25 MG tablet Take 1 tablet (25 mg total) by mouth daily after breakfast. 90 tablet 1   FARXIGA 10 MG TABS tablet TAKE 1 TABLET BY MOUTH DAILY BEFORE BREAKFAST. 90 tablet 3   fluticasone (FLONASE) 50 MCG/ACT nasal spray Place 1 spray into both nostrils as needed.      gabapentin (NEURONTIN) 600 MG tablet TAKE 1 TABLET IN THE MORNING AND 1 TABLET IN THE AFTERNOON AND 2TABS AT BEDTIME     guaiFENesin-codeine (CHERATUSSIN AC) 100-10 MG/5ML syrup Take 5 mLs by mouth 3 (three) times daily as needed for cough. 120 mL 0   insulin glargine (LANTUS SOLOSTAR) 100 UNIT/ML Solostar Pen Inject 62 Units into the skin daily. 30 mL 0   Insulin Pen Needle (B-D UF III MINI PEN NEEDLES) 31G X 5 MM MISC USE AS DIRECTED WITH INSULIN EVERY MORNING, NOON, EVENING AND AT BEDTIME 400 each 3   Insulin Pen Needle 30G X 5 MM MISC 1 Device by Does not apply route in the morning, at noon, in the evening, and at bedtime. 400 each 3   ketoconazole (NIZORAL) 2 % cream Apply 1 Application topically daily. 60 g 0   levothyroxine (SYNTHROID) 150 MCG tablet Take 1 tablet (150 mcg total) by mouth daily. 90 tablet 3   losartan (COZAAR) 25 MG tablet Take 1 tablet (25 mg total) by mouth daily. 90 tablet 1   methocarbamol (ROBAXIN) 500 MG tablet Take  1 tablet (500 mg total) by mouth every 8 (eight) hours as needed for muscle spasms. 30 tablet 0   Multiple Vitamins-Minerals (MULTIVITAMIN WITH MINERALS) tablet Take 1 tablet by mouth daily.     mycophenolate (CELLCEPT) 500 MG tablet Take 1,500 mg by mouth 2 (two) times daily.     promethazine-dextromethorphan (PROMETHAZINE-DM) 6.25-15 MG/5ML syrup Take 5 mLs by mouth 4 (four) times daily as needed for cough. 180 mL 0   Pyridoxine HCl (VITAMIN B-6 PO) Take by mouth daily.     Semaglutide, 1 MG/DOSE, (OZEMPIC, 1 MG/DOSE,) 4 MG/3ML SOPN Inject 1 mg into the skin once a week. 3 mL 5   UNABLE TO FIND 1 each by Does not apply route  daily. Med Name: POWER WHEELCHAIR  DX UEAV-W09.81 (Patient not taking: Reported on 08/09/2022) 1 each 0   UNABLE TO FIND 1 each by Does not apply route daily. Med Name: POWER SCOOTER DX CODE-R26.89 (Patient not taking: Reported on 08/09/2022) 1 each 0   VITAMIN D PO Take by mouth daily.     No current facility-administered medications for this visit.    Allergies: Allergies  Allergen Reactions   Codeine Swelling   Influenza A (H1n1) Monoval Pf Anaphylaxis   Hydrocodone-Acetaminophen Other (See Comments)   Lisinopril Other (See Comments)   Metformin Other (See Comments)   Pregabalin Other (See Comments)   Jardiance [Empagliflozin] Other (See Comments)    dehydration    Past Medical History:  Diagnosis Date   Abdominal wall contusion 12/09/2011   Achilles tendon contracture, bilateral 06/03/2016   Anxiety    Arthritis    Blood dyscrasia    low platelet count- sees [name] , Dr. Nelva Nay ( Note in EPIC from 07/2014) at [facility]   Breast cancer [facility]) 2017   Left Breast   Breast cancer (HCC) 07/2021   right breast IDC   Cancer (HCC)    left breast   Chest wall contusion 12/09/2011   Cirrhosis of liver (HCC)    Cough    Diabetes mellitus without complication (HCC)    Diabetic neuropathy (HCC)    Diarrhea due to drug 01/29/2021   Dyspnea    Gout    Gout    History of radiation therapy 10/22/15 - 12/08/15   left breast 50.4 Gy, boost to 10 Gy   History of radiation therapy    Right breast- 09/22/21-10/30/21- Dr. 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   Neuromuscular disorder (HCC)    diabetic neuropathy   Neuropathy    Personal history of radiation therapy 2017   Left Breast Cancer   Posterior tibial tendon dysfunction (PTTD) of right lower extremity 10/09/2019   Sleep apnea    CPAP nightly   Spleen enlarged    Thyroid disease    Past Surgical History:  Procedure Laterality Date    ANKLE FUSION Right 2022   BREAST BIOPSY Left 08/04/2015   Procedure: LEFT BREAST BIOPSY WITH NEEDLE LOCALIZATION;  Surgeon: Darnell Level, MD;  Location: [facility];  Service: General;  Laterality: Left;   BREAST BIOPSY Right 06/23/2021   BREAST DUCTAL SYSTEM EXCISION Left 08/04/2015   Procedure: LEFT EXCISION DUCTAL SYSTEM BREAST;  Surgeon: Darnell Level, MD;  Location: [facility];  Service: General;  Laterality: Left;   BREAST LUMPECTOMY Left 08/2015   BREAST LUMPECTOMY Right 08/2021   BREAST LUMPECTOMY WITH AXILLARY LYMPH NODE BIOPSY Left 09/12/2015  Procedure: RE-EXCISION OF LEFT BREAST LUMPECTOMY WITH LEFT AXILLARY LYMPH NODE BIOPSY;  Surgeon: Darnell Level, MD;  Location: [facility];  Service: General;  Laterality: Left;   BREAST LUMPECTOMY WITH RADIOACTIVE SEED LOCALIZATION Right 08/12/2021   Procedure: RIGHT BREAST LUMPECTOMY WITH RADIOACTIVE SEED LOCALIZATION;  Surgeon: Abigail Miyamoto, MD;  Location: [facility];  Service: General;  Laterality: Right;  LMA   CHOLECYSTECTOMY     EYE SURGERY Bilateral 2022   Lashes growing backwards into the eye   JOINT REPLACEMENT     left knee   JOINT REPLACEMENT  2017   right knee   REVERSE SHOULDER ARTHROPLASTY Right 09/07/2018   Procedure: RIGHT REVERSE SHOULDER ARTHROPLASTY;  Surgeon: Cammy Copa, MD;  Location: [facility];  Service: Orthopedics;  Laterality: Right;   TOTAL KNEE ARTHROPLASTY Right 02/28/2015   Procedure: RIGHT TOTAL KNEE ARTHROPLASTY;  Surgeon: Kathryne Hitch, MD;  Location: [facility];  Service: Orthopedics;  Laterality: Right;   TUBAL LIGATION     Family History  Problem Relation Age of Onset   Hypothyroidism Mother    Alzheimer's disease Mother    Diabetes Father    Cancer Sister        GYN cancer   Hypothyroidism Sister    Heart disease Brother    Arthritis Son    Diabetes Son    Arthritis Son    Cancer Maternal Grandmother    Heart failure Maternal  Grandfather    Bone cancer Paternal Grandmother    Heart failure Paternal Grandmother    Social History   Socioeconomic History   Marital status: Married    Spouse name: edward   Number of children: 3   Years of education: 12th   Highest education level: Not on file  Occupational History   Occupation: Retired    Comment: Dance movement psychotherapist  Tobacco Use   Smoking status: Never   Smokeless tobacco: Never  Vaping Use   Vaping status: Never Used  Substance and Sexual Activity   Alcohol use: No   Drug use: No   Sexual activity: Yes    Birth control/protection: Surgical    Comment: BTL  Other Topics Concern   Not on file  Social History Narrative   Not on file   Social Determinants of Health   Financial Resource Strain: Low Risk  (02/11/2021)   Overall Financial Resource Strain (CARDIA)    Difficulty of Paying Living Expenses: Not very hard  Food Insecurity: No Food Insecurity (08/27/2021)   Hunger Vital Sign    Worried About Running Out of Food in the Last Year: Never true    Ran Out of Food in the Last Year: Never true  Transportation Needs: No Transportation Needs (08/27/2021)   PRAPARE - Administrator, Civil Service (Medical): No    Lack of Transportation (Non-Medical): No  Physical Activity: Insufficiently Active (02/11/2021)   Exercise Vital Sign    Days of Exercise per Week: 3 days    Minutes of Exercise per Session: 30 min  Stress: No Stress Concern Present (02/11/2021)   [employer] of Occupational Health - Occupational Stress Questionnaire    Feeling of Stress : Only a little  Social Connections: Moderately Integrated (02/11/2021)   Social Connection and Isolation Panel [NHANES]    Frequency of Communication with Friends and Family: Three times a week    Frequency of Social Gatherings with Friends and Family: Never    Attends Religious Services: More than 4 times  per year    Active Member of Clubs or Organizations: No    Attends Tax inspector Meetings: Never    Marital Status: Married  Catering manager Violence: Not At Risk (02/11/2021)   Humiliation, Afraid, Rape, and Kick questionnaire    Fear of Current or Ex-Partner: No    Emotionally Abused: No    Physically Abused: No    Sexually Abused: No    Review of Systems Constitutional: Patient ***denies any unintentional weight loss or change in strength lntegumentary: Patient ***denies any rashes or pruritus Eyes: Patient denies ***dry eyes ENT: Patient ***denies dry mouth Cardiovascular: Patient ***denies chest pain or syncope Respiratory: Patient ***denies shortness of breath Gastrointestinal: Patient ***denies nausea, vomiting, constipation, or diarrhea Musculoskeletal: Patient ***denies muscle cramps or weakness Neurologic: Patient ***denies convulsions or seizures Psychiatric: Patient ***denies memory problems Allergic/Immunologic: Patient ***denies recent allergic reaction(s) Hematologic/Lymphatic: Patient denies bleeding tendencies Endocrine: Patient ***denies heat/cold intolerance  GU: As per HPI.  OBJECTIVE There were no vitals filed for this visit. There is no height or weight on file to calculate BMI.  Physical Examination  Constitutional: ***No obvious distress; patient is ***non-toxic appearing  Cardiovascular: ***No visible lower extremity edema.  Respiratory: The patient does ***not have audible wheezing/stridor; respirations do ***not appear labored  Gastrointestinal: Abdomen ***non-distended Musculoskeletal: ***Normal ROM of UEs  Skin: ***No obvious rashes/open sores  Neurologic: CN 2-12 grossly ***intact Psychiatric: Answered questions ***appropriately with ***normal affect  Hematologic/Lymphatic/Immunologic: ***No obvious bruises or sites of spontaneous bleeding  UA: {Desc; negative/positive:13464} for *** WBC/hpf, *** RBC/hpf, bacteria (***) PVR: *** ml  ASSESSMENT Urinary tract infection without hematuria, site  unspecified ***  Will plan for follow up in *** months / ***1 year or sooner if needed. Pt verbalized understanding and agreement. All questions were answered.  PLAN Advised the following: 1. *** 2. ***No follow-ups on file.  No orders of the defined types were placed in this encounter.   It has been explained that the patient is to follow regularly with their PCP in addition to all other providers involved in their care and to follow instructions provided by these respective offices. Patient advised to contact urology clinic if any urologic-pertaining questions, concerns, new symptoms or problems arise in the interim period.  There are no Patient Instructions on file for this visit.  Electronically signed by:  Donnita Falls, FNP   08/23/22    4:14 PM

## 2022-08-24 ENCOUNTER — Ambulatory Visit (INDEPENDENT_AMBULATORY_CARE_PROVIDER_SITE_OTHER): Payer: Medicare Other | Admitting: Urology

## 2022-08-24 ENCOUNTER — Ambulatory Visit: Payer: Medicare Other | Admitting: General Surgery

## 2022-08-24 ENCOUNTER — Encounter: Payer: Self-pay | Admitting: Urology

## 2022-08-24 VITALS — BP 140/67 | HR 87 | Temp 98.6°F

## 2022-08-24 DIAGNOSIS — R140 Abdominal distension (gaseous): Secondary | ICD-10-CM | POA: Diagnosis not present

## 2022-08-24 DIAGNOSIS — R350 Frequency of micturition: Secondary | ICD-10-CM

## 2022-08-24 DIAGNOSIS — N390 Urinary tract infection, site not specified: Secondary | ICD-10-CM

## 2022-08-24 DIAGNOSIS — R682 Dry mouth, unspecified: Secondary | ICD-10-CM | POA: Diagnosis not present

## 2022-08-24 DIAGNOSIS — R3915 Urgency of urination: Secondary | ICD-10-CM

## 2022-08-24 DIAGNOSIS — Z87440 Personal history of urinary (tract) infections: Secondary | ICD-10-CM | POA: Diagnosis not present

## 2022-08-24 DIAGNOSIS — K5909 Other constipation: Secondary | ICD-10-CM

## 2022-08-24 DIAGNOSIS — N3941 Urge incontinence: Secondary | ICD-10-CM

## 2022-08-24 LAB — MICROSCOPIC EXAMINATION
Bacteria, UA: NONE SEEN
RBC, Urine: NONE SEEN /hpf (ref 0–2)
WBC, UA: NONE SEEN /hpf (ref 0–5)

## 2022-08-24 LAB — URINALYSIS, ROUTINE W REFLEX MICROSCOPIC
Bilirubin, UA: NEGATIVE
Ketones, UA: NEGATIVE
Leukocytes,UA: NEGATIVE
Nitrite, UA: NEGATIVE
RBC, UA: NEGATIVE
Specific Gravity, UA: 1.025 (ref 1.005–1.030)
Urobilinogen, Ur: 0.2 mg/dL (ref 0.2–1.0)
pH, UA: 5.5 (ref 5.0–7.5)

## 2022-08-24 LAB — BLADDER SCAN AMB NON-IMAGING: Scan Result: 0

## 2022-08-24 MED ORDER — MIRABEGRON ER 25 MG PO TB24
25.0000 mg | ORAL_TABLET | Freq: Every day | ORAL | 11 refills | Status: AC
Start: 2022-08-24 — End: ?

## 2022-08-27 NOTE — Progress Notes (Signed)
Please let patient know that per radiology interpretation the bladder was "underdistended without any significant sonographic abnormality." Dr. Ronne Binning looked at the image and advised cystoscopy; advise that [name] [name] proceed with that. Please schedule 1st available.

## 2022-08-30 ENCOUNTER — Ambulatory Visit (INDEPENDENT_AMBULATORY_CARE_PROVIDER_SITE_OTHER): Payer: Medicare Other

## 2022-08-30 DIAGNOSIS — M2042 Other hammer toe(s) (acquired), left foot: Secondary | ICD-10-CM

## 2022-08-30 DIAGNOSIS — M79674 Pain in right toe(s): Secondary | ICD-10-CM | POA: Diagnosis not present

## 2022-08-30 DIAGNOSIS — M2041 Other hammer toe(s) (acquired), right foot: Secondary | ICD-10-CM | POA: Diagnosis not present

## 2022-08-30 DIAGNOSIS — Z794 Long term (current) use of insulin: Secondary | ICD-10-CM

## 2022-08-30 DIAGNOSIS — M79675 Pain in left toe(s): Secondary | ICD-10-CM

## 2022-08-30 DIAGNOSIS — E1142 Type 2 diabetes mellitus with diabetic polyneuropathy: Secondary | ICD-10-CM | POA: Diagnosis not present

## 2022-08-30 NOTE — Progress Notes (Signed)
Patient presents today to pick up diabetic shoes and insoles.  Patient was dispensed 1 pair of diabetic shoes and 3 pairs of foam casted diabetic insoles. Fit was satisfactory. A little snug on Left foot will call back if bothers her too much Instructions for break-in and wear was reviewed and a copy was given to the patient.   Re-appointment for regularly scheduled diabetic foot care visits or if they should experience any trouble with the shoes or insoles.  [name] [name] Cped CFo CFm

## 2022-09-13 ENCOUNTER — Telehealth: Payer: Self-pay

## 2022-09-13 NOTE — Telephone Encounter (Signed)
Patient is made aware of Sarah recommendation "Please let patient know that per radiology interpretation the bladder was "underdistended without any significant sonographic abnormality." Dr. Ronne Binning looked at the image and advised cystoscopy; advise that she proceed with that. Please schedule 1st available." Patient voiced understanding.

## 2022-09-17 ENCOUNTER — Telehealth: Payer: Self-pay | Admitting: Internal Medicine

## 2022-09-17 ENCOUNTER — Telehealth (INDEPENDENT_AMBULATORY_CARE_PROVIDER_SITE_OTHER): Payer: Medicare Other | Admitting: Internal Medicine

## 2022-09-17 ENCOUNTER — Encounter: Payer: Self-pay | Admitting: Internal Medicine

## 2022-09-17 ENCOUNTER — Other Ambulatory Visit: Payer: Self-pay | Admitting: Internal Medicine

## 2022-09-17 DIAGNOSIS — U071 COVID-19: Secondary | ICD-10-CM

## 2022-09-17 DIAGNOSIS — D849 Immunodeficiency, unspecified: Secondary | ICD-10-CM | POA: Diagnosis not present

## 2022-09-17 MED ORDER — NIRMATRELVIR/RITONAVIR (PAXLOVID)TABLET
3.0000 | ORAL_TABLET | Freq: Two times a day (BID) | ORAL | 0 refills | Status: AC
Start: 2022-09-17 — End: 2022-09-22

## 2022-09-17 NOTE — Progress Notes (Signed)
Virtual Visit via Video Note   Because of [name] [name]'s co-morbid illnesses, she is at least at moderate risk for complications without adequate follow up.  This format is felt to be most appropriate for this patient at this time.  All issues noted in this document were discussed and addressed.  A limited physical exam was performed with this format.  Evaluation Performed:  Follow-up visit  Date:  09/17/2022   ID:  [name] [name], DOB May 14, 1950, MRN [id number]  Patient Location: Home Provider Location: Office/Clinic  Participants: Patient Location of Patient: Home Location of Provider: Telehealth Consent was obtain for visit to be over via telehealth. I verified that I am speaking with the correct person using two identifiers.  PCP:  Anabel Halon, MD   Chief Complaint: Nasal congestion and cough  History of Present Illness:    [name] [name] is a 72 y.o. female who has a video visit for complaint of nasal congestion, cough and sore throat for the last 3 days.  Her home COVID test was positive yesterday.  She has been using albuterol inhaler as needed for dyspnea.  Denies any hemoptysis or worsening of wheezing.  The patient does have symptoms concerning for COVID-19 infection (fever, chills, cough, or new shortness of breath).   Past Medical, Surgical, Social History, Allergies, and Medications have been Reviewed.  Past Medical History:  Diagnosis Date   Abdominal wall contusion 12/09/2011   Achilles tendon contracture, bilateral 06/03/2016   Anxiety    Arthritis    Blood dyscrasia    low platelet count- sees [name] , Dr. Nelva Nay ( Note in EPIC from 07/2014) at [facility]   Breast cancer [facility]) 2017   Left Breast   Breast cancer (HCC) 07/2021   right breast IDC   Cancer (HCC)    left breast   Chest wall contusion 12/09/2011   Cirrhosis of liver (HCC)    Cough    Diabetes mellitus without complication (HCC)    Diabetic neuropathy (HCC)     Diarrhea due to drug 01/29/2021   Dyspnea    Gout    Gout    History of radiation therapy 10/22/15 - 12/08/15   left breast 50.4 Gy, boost to 10 Gy   History of radiation therapy    Right breast- 09/22/21-10/30/21- Dr. 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   Neuromuscular disorder (HCC)    diabetic neuropathy   Neuropathy    Personal history of radiation therapy 2017   Left Breast Cancer   Posterior tibial tendon dysfunction (PTTD) of right lower extremity 10/09/2019   Sleep apnea    CPAP nightly   Spleen enlarged    Thyroid disease    Past Surgical History:  Procedure Laterality Date   ANKLE FUSION Right 2022   BREAST BIOPSY Left 08/04/2015   Procedure: LEFT BREAST BIOPSY WITH NEEDLE LOCALIZATION;  Surgeon: Darnell Level, MD;  Location: [facility];  Service: General;  Laterality: Left;   BREAST BIOPSY Right 06/23/2021   BREAST DUCTAL SYSTEM EXCISION Left 08/04/2015   Procedure: LEFT EXCISION DUCTAL SYSTEM BREAST;  Surgeon: Darnell Level, MD;  Location: [facility];  Service: General;  Laterality: Left;   BREAST LUMPECTOMY Left 08/2015   BREAST LUMPECTOMY Right 08/2021   BREAST LUMPECTOMY WITH AXILLARY LYMPH NODE BIOPSY Left 09/12/2015   Procedure: RE-EXCISION OF LEFT BREAST LUMPECTOMY WITH LEFT AXILLARY  LYMPH NODE BIOPSY;  Surgeon: Darnell Level, MD;  Location: [facility];  Service: General;  Laterality: Left;   BREAST LUMPECTOMY WITH RADIOACTIVE SEED LOCALIZATION Right 08/12/2021   Procedure: RIGHT BREAST LUMPECTOMY WITH RADIOACTIVE SEED LOCALIZATION;  Surgeon: Abigail Miyamoto, MD;  Location: [facility];  Service: General;  Laterality: Right;  LMA   CHOLECYSTECTOMY     EYE SURGERY Bilateral 2022   Lashes growing backwards into the eye   JOINT REPLACEMENT     left knee   JOINT REPLACEMENT  2017   right knee   REVERSE SHOULDER ARTHROPLASTY Right 09/07/2018    Procedure: RIGHT REVERSE SHOULDER ARTHROPLASTY;  Surgeon: Cammy Copa, MD;  Location: [facility];  Service: Orthopedics;  Laterality: Right;   TOTAL KNEE ARTHROPLASTY Right 02/28/2015   Procedure: RIGHT TOTAL KNEE ARTHROPLASTY;  Surgeon: Kathryne Hitch, MD;  Location: [facility];  Service: Orthopedics;  Laterality: Right;   TUBAL LIGATION       No outpatient medications have been marked as taking for the 09/17/22 encounter (Video Visit) with Anabel Halon, MD.     Allergies:   Codeine, Influenza a (h1n1) monoval pf, Hydrocodone-acetaminophen, Lisinopril, Metformin, Pregabalin, and Jardiance [empagliflozin]   ROS:   Please see the history of present illness.     All other systems reviewed and are negative.   Labs/Other Tests and Data Reviewed:    Recent Labs: 12/14/2021: TSH 4.28 08/09/2022: ALT 16; BUN 17; Creat 0.90; Hemoglobin 13.4; Platelets 79; Potassium 3.9; Sodium 142   Recent Lipid Panel Lab Results  Component Value Date/Time   CHOL 171 06/09/2021 08:04 AM   TRIG 137.0 06/09/2021 08:04 AM   HDL 63.30 06/09/2021 08:04 AM   CHOLHDL 3 06/09/2021 08:04 AM   LDLCALC 80 06/09/2021 08:04 AM    Wt Readings from Last 3 Encounters:  08/09/22 246 lb 12.8 oz (111.9 kg)  07/29/22 254 lb (115.2 kg)  07/29/22 254 lb 12.8 oz (115.6 kg)     Objective:    Vital Signs:  There were no vitals taken for this visit.   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 The patient was provided an opportunity to ask questions, and all were answered. The patient agreed with the plan and demonstrated an understanding of the instructions.   The patient was advised to call back or seek an in-person evaluation if the symptoms worsen or if the condition fails to improve as anticipated.  The above assessment and management plan was discussed with the patient. The patient verbalized understanding of and has agreed to the management plan.   Medication Adjustments/Labs and Tests Ordered: Current medicines are reviewed at length with the patient today.  Concerns regarding medicines are outlined above.   Tests Ordered: No orders of the defined types were placed in this encounter.   Medication Changes: No orders of the defined types were placed in this  encounter.    Note: This dictation was prepared with Dragon dictation along with smaller phrase technology. Similar sounding words can be transcribed inadequately or Coulon not be corrected upon review. Any transcriptional errors that result from this process are unintentional.      Disposition:  Follow up  Signed, Anabel Halon, MD  09/17/2022 12:48 PM     [facility]

## 2022-09-17 NOTE — Assessment & Plan Note (Signed)
Considering her chronic medical conditions, started Paxlovid Flonase for nasal congestion Albuterol as needed for dyspnea or wheezing Cheratussin as needed for cough

## 2022-09-17 NOTE — Telephone Encounter (Signed)
Patient tested positive for covid with fever needs meds call into her pharmacy  Pharmacy  [facility] [phone] [place], [place] - [address] [place] [address] Phone: [phone]  Fax: [phone]

## 2022-09-17 NOTE — Assessment & Plan Note (Signed)
She takes CellCept for history of ocular cicatricial pemphigoid Advised to hold CellCept for now due to active COVID-19 infection

## 2022-09-17 NOTE — Telephone Encounter (Signed)
Spoke to patient will wait for an cancellation to do a video call with mychart today with any providers.

## 2022-09-17 NOTE — Telephone Encounter (Signed)
Scheduled

## 2022-09-21 NOTE — Telephone Encounter (Signed)
Pt left message needing to schedule EGD.   Returned call to patient. EGD scheduled for 11/11/2050 at 10:30am. Instructions sent via mail per pt request. Will call pt with pre op

## 2022-09-29 ENCOUNTER — Encounter: Payer: Self-pay | Admitting: Hematology and Oncology

## 2022-09-29 ENCOUNTER — Telehealth (INDEPENDENT_AMBULATORY_CARE_PROVIDER_SITE_OTHER): Payer: Self-pay | Admitting: Gastroenterology

## 2022-09-29 ENCOUNTER — Other Ambulatory Visit: Payer: Self-pay

## 2022-09-29 NOTE — Telephone Encounter (Signed)
Pt has been rescheduled from 10/15/22 to 10/19/22 at 10:00am. Will send updated instructions via my chart.

## 2022-09-30 ENCOUNTER — Other Ambulatory Visit: Payer: Self-pay

## 2022-10-05 ENCOUNTER — Other Ambulatory Visit: Payer: Self-pay

## 2022-10-06 ENCOUNTER — Other Ambulatory Visit: Payer: Self-pay

## 2022-10-12 ENCOUNTER — Encounter: Payer: Self-pay | Admitting: Adult Health

## 2022-10-12 ENCOUNTER — Inpatient Hospital Stay: Payer: Medicare Other | Attending: Hematology and Oncology | Admitting: Adult Health

## 2022-10-12 VITALS — BP 144/65 | HR 88 | Temp 97.7°F | Resp 18 | Ht 61.0 in | Wt 248.0 lb

## 2022-10-12 DIAGNOSIS — C50411 Malignant neoplasm of upper-outer quadrant of right female breast: Secondary | ICD-10-CM | POA: Diagnosis not present

## 2022-10-12 DIAGNOSIS — Z923 Personal history of irradiation: Secondary | ICD-10-CM | POA: Insufficient documentation

## 2022-10-12 DIAGNOSIS — Z79811 Long term (current) use of aromatase inhibitors: Secondary | ICD-10-CM | POA: Diagnosis not present

## 2022-10-12 DIAGNOSIS — Z170 Estrogen receptor positive status [ER+]: Secondary | ICD-10-CM | POA: Diagnosis not present

## 2022-10-12 DIAGNOSIS — Z8049 Family history of malignant neoplasm of other genital organs: Secondary | ICD-10-CM | POA: Insufficient documentation

## 2022-10-12 DIAGNOSIS — Z808 Family history of malignant neoplasm of other organs or systems: Secondary | ICD-10-CM | POA: Diagnosis not present

## 2022-10-12 DIAGNOSIS — C50412 Malignant neoplasm of upper-outer quadrant of left female breast: Secondary | ICD-10-CM | POA: Diagnosis not present

## 2022-10-12 NOTE — Assessment & Plan Note (Signed)
[name] [name] is a 72 year old woman with history of left-sided breast cancer from 2017 status posttreatment with more recent right sided breast cancer diagnosed in [place] 2023, ER/PR positive, HER2 negative.  She is status postlumpectomy and adjuvant radiation and continues on exemestane daily that she began in June 2024.  Stage Ia right breast cancer: She has no clinical or radiographic signs of breast cancer recurrence.  She will continue on exemestane daily as she is tolerating it well.  Her next mammogram is due in July 2025. Bone health: Her most recent bone density is from 2021 and is completely normal.  Considering the fact that she has other health issues and procedures going on over the next few months I suggested that we order this in 6 months when she returns for her follow-up.  We will see [name] [name] back in 6 months for follow-up for her breast cancer.  She knows to call for any questions or concerns that Weil arise between now and then.

## 2022-10-12 NOTE — Patient Instructions (Addendum)
[name] [name]  10/12/2022     @PREFPERIOPPHARMACY @   Your procedure is scheduled on 10/19/2022.  Report to [facility] at 8:00 A.M.  Call this number if you have problems the morning of surgery:  [phone]  If you experience any cold or flu symptoms such as cough, fever, chills, shortness of breath, etc. between now and your scheduled surgery, please notify us at the above number.   Remember:   Please Follow the diet instructions given to you by Dr Wilburt Finlay office.      Take these medicines the morning of surgery with A SIP OF WATER : Wellbutrin Flonase Gabapentin Synthroid Mirabegron and Cellcept.    Please use your inhaler the morning of the procedure and bring your rescue inhaler with you.    Last dose of Marcelline Deist should be on 10/15/2022   Last dose of Ozempic should be on 10/11/2022   Take half of your long acting insulin the night before the procedure (31 units)    Do not take any diabetic medications the am of the procedure.       Do not wear jewelry, make-up or nail polish, including gel polish,  artificial nails, or any other type of covering on natural nails (fingers and  toes).  Do not wear lotions, powders, or perfumes, or deodorant.  Do not shave 48 hours prior to surgery.  Men Eggebrecht shave face and neck.  Do not bring valuables to the hospital.  [facility] is not responsible for any belongings or valuables.  Contacts, dentures or bridgework Levario not be worn into surgery.  Leave your suitcase in the car.  After surgery it Schiff be brought to your room.  For patients admitted to the hospital, discharge time will be determined by your treatment team.  Patients discharged the day of surgery will not be allowed to drive home.   Name and phone number of your driver:   Family Special instructions:  N/A  Please read over the following fact sheets that you were given. Care and Recovery After Surgery   Upper Endoscopy, Adult Upper endoscopy is a procedure to look  inside the upper GI (gastrointestinal) tract. The upper GI tract is made up of: The esophagus. This is the part of the body that moves food from your mouth to your stomach. The stomach. The duodenum. This is the first part of your small intestine. This procedure is also called esophagogastroduodenoscopy (EGD) or gastroscopy. In this procedure, your health care provider passes a thin, flexible tube (endoscope) through your mouth and down your esophagus into your stomach and into your duodenum. A small camera is attached to the end of the tube. Images from the camera appear on a monitor in the exam room. During this procedure, your health care provider Burby also remove a small piece of tissue to be sent to a lab and examined under a microscope (biopsy). Your health care provider Milnes do an upper endoscopy to diagnose cancers of the upper GI tract. You Bouska also have this procedure to find the cause of other conditions, such as: Stomach pain. Heartburn. Pain or problems when swallowing. Nausea and vomiting. Stomach bleeding. Stomach ulcers. Tell a health care provider about: Any allergies you have. All medicines you are taking, including vitamins, herbs, eye drops, creams, and over-the-counter medicines. Any problems you or family members have had with anesthetic medicines. Any bleeding problems you have. Any surgeries you have had. Any medical conditions you have. Whether you are pregnant or  Kullman be pregnant. What are the risks? Your healthcare provider will talk with you about risks. These Carfagno include: Infection. Bleeding. Allergic reactions to medicines. A tear or hole (perforation) in the esophagus, stomach, or duodenum. What happens before the procedure? When to stop eating and drinking Follow instructions from your health care provider about what you Salvo eat and drink. These Reno include: 8 hours before your procedure Stop eating most foods. Do not eat meat, fried foods, or fatty  foods. Eat only light foods, such as toast or crackers. All liquids are okay except energy drinks and alcohol. 6 hours before your procedure Stop eating. Drink only clear liquids, such as water, clear fruit juice, black coffee, plain tea, and sports drinks. Do not drink energy drinks or alcohol. 2 hours before your procedure Stop drinking all liquids. You Clardy be allowed to take medicines with small sips of water. If you do not follow your health care provider's instructions, your procedure Skeet be delayed or canceled. Medicines Ask your health care provider about: Changing or stopping your regular medicines. This is especially important if you are taking diabetes medicines or blood thinners. Taking medicines such as aspirin and ibuprofen. These medicines can thin your blood. Do not take these medicines unless your health care provider tells you to take them. Taking over-the-counter medicines, vitamins, herbs, and supplements. General instructions If you will be going home right after the procedure, plan to have a responsible adult: Take you home from the hospital or clinic. You will not be allowed to drive. Care for you for the time you are told. What happens during the procedure?  An IV will be inserted into one of your veins. You Hartl be given one or more of the following: A medicine to help you relax (sedative). A medicine to numb the throat (local anesthetic). You will lie on your left side on an exam table. Your health care provider will pass the endoscope through your mouth and down your esophagus. Your health care provider will use the scope to check the inside of your esophagus, stomach, and duodenum. Biopsies Carreira be taken. The endoscope will be removed. The procedure Sicard vary among health care providers and hospitals. What happens after the procedure? Your blood pressure, heart rate, breathing rate, and blood oxygen level will be monitored until you leave the hospital or  clinic. When your throat is no longer numb, you Volner be given some fluids to drink. If you were given a sedative during the procedure, it can affect you for several hours. Do not drive or operate machinery until your health care provider says that it is safe. It is up to you to get the results of your procedure. Ask your health care provider, or the department that is doing the procedure, when your results will be ready. Contact a health care provider if you: Have a sore throat that lasts longer than 1 day. Have a fever. Get help right away if you: Vomit blood or your vomit looks like coffee grounds. Have bloody, black, or tarry stools. Have a very bad sore throat or you cannot swallow. Have difficulty breathing or very bad pain in your chest or abdomen. These symptoms Smithhart be an emergency. Get help right away. Call 911. Do not wait to see if the symptoms will go away. Do not drive yourself to the hospital. Summary Upper endoscopy is a procedure to look inside the upper GI tract. During the procedure, an IV will be inserted into one of your  veins. You Oberhaus be given a medicine to help you relax. The endoscope will be passed through your mouth and down your esophagus. Follow instructions from your health care provider about what you can eat and drink. This information is not intended to replace advice given to you by your health care provider. Make sure you discuss any questions you have with your health care provider. Document Revised: 04/29/2021 Document Reviewed: 04/29/2021 Elsevier Patient Education  2024 [employer]   Monitored Anesthesia Care Anesthesia refers to the techniques, procedures, and medicines that help a person stay safe and comfortable during surgery. Monitored anesthesia care, or sedation, is one type of anesthesia. You Cade have sedation if you do not need to be asleep for your procedure. Procedures that use sedation Trimble include: Surgery to remove cataracts from your  eyes. A dental procedure. A biopsy. This is when a tissue sample is removed and looked at under a microscope. You will be watched closely during your procedure. Your level of sedation or type of anesthesia Bridgers be changed to fit your needs. Tell a health care provider about: Any allergies you have. All medicines you are taking, including vitamins, herbs, eye drops, creams, and over-the-counter medicines. Any problems you or family members have had with anesthesia. Any bleeding problems you have. Any surgeries you have had. Any medical conditions or illnesses you have. This includes sleep apnea, cough, fever, or the flu. Whether you are pregnant or Rooks be pregnant. Whether you use cigarettes, alcohol, or drugs. Any use of steroids, whether by mouth or as a cream. What are the risks? Your health care provider will talk with you about risks. These Monsivais include: Getting too much medicine (oversedation). Nausea. Allergic reactions to medicines. Trouble breathing. If this happens, a breathing tube Rajan be used to help you breathe. It will be removed when you are awake and breathing on your own. Heart trouble. Lung trouble. Confusion that gets better with time (emergence delirium). What happens before the procedure? When to stop eating and drinking Follow instructions from your health care provider about what you Lininger eat and drink. These Gemmer include: 8 hours before your procedure Stop eating most foods. Do not eat meat, fried foods, or fatty foods. Eat only light foods, such as toast or crackers. All liquids are okay except energy drinks and alcohol. 6 hours before your procedure Stop eating. Drink only clear liquids, such as water, clear fruit juice, black coffee, plain tea, and sports drinks. Do not drink energy drinks or alcohol. 2 hours before your procedure Stop drinking all liquids. You Dupler be allowed to take medicines with small sips of water. If you do not follow your health care  provider's instructions, your procedure Muscato be delayed or canceled. Medicines Ask your health care provider about: Changing or stopping your regular medicines. These include any diabetes medicines or blood thinners you take. Taking medicines such as aspirin and ibuprofen. These medicines can thin your blood. Do not take them unless your health care provider tells you to. Taking over-the-counter medicines, vitamins, herbs, and supplements. Testing You Vachon have an exam or testing. You Prospero have a blood or urine sample taken. General instructions Do not use any products that contain nicotine or tobacco for at least 4 weeks before the procedure. These products include cigarettes, chewing tobacco, and vaping devices, such as e-cigarettes. If you need help quitting, ask your health care provider. If you will be going home right after the procedure, plan to have a responsible adult:  Take you home from the hospital or clinic. You will not be allowed to drive. Care for you for the time you are told. What happens during the procedure?  Your blood pressure, heart rate, breathing, level of pain, and blood oxygen level will be monitored. An IV will be inserted into one of your veins. You Langone be given: A sedative. This helps you relax. Anesthesia. This will: Numb certain areas of your body. Make you fall asleep for surgery. You will be given medicines as needed to keep you comfortable. The more medicine you are given, the deeper your level of sedation will be. Your level of sedation Brimley be changed to fit your needs. There are three levels of sedation: Mild sedation. At this level, you Litt feel awake and relaxed. You will be able to follow directions. Moderate sedation. At this level, you will be sleepy. You Byrd not remember the procedure. Deep sedation. At this level, you will be asleep. You will not remember the procedure. How you get the medicines will depend on your age and the procedure. They Kluever be  given as: A pill. This Saephan be taken by mouth (orally) or inserted into the rectum. An injection. This Craun be into a vein or muscle. A spray through the nose. After your procedure is over, the medicine will be stopped. The procedure Rollo vary among health care providers and hospitals. What happens after the procedure? Your blood pressure, heart rate, breathing rate, and blood oxygen level will be monitored until you leave the hospital or clinic. You Lirette feel sleepy, clumsy, or nauseous. You Randal not remember what happened during or after the procedure. Sedation can affect you for several hours. Do not drive or use machinery until your health care provider says that it is safe. This information is not intended to replace advice given to you by your health care provider. Make sure you discuss any questions you have with your health care provider. Document Revised: 06/15/2021 Document Reviewed: 06/15/2021 Elsevier Patient Education  2024 [employer].

## 2022-10-12 NOTE — Progress Notes (Signed)
[facility] Cancer Follow up:    [name] Halon, MD [address] [place] [address]   DIAGNOSIS:  Cancer Staging  Malignant neoplasm of upper-outer quadrant of left breast in female, estrogen receptor positive (HCC) Staging form: Breast, AJCC 7th Edition - Clinical: Stage IA (T1c, N0, M0) - Signed by [name] Dell, MD on 08/19/2015 - Pathologic: Stage IA (T1c, N0, cM0) - Signed by [name] Socks, NP on 10/26/2021   SUMMARY OF ONCOLOGIC HISTORY: Oncology History  Malignant neoplasm of upper-outer quadrant of left breast in female, estrogen receptor positive (HCC)  02/02/2015 - 02/2021 Anti-estrogen oral therapy   Anastrozole daily, completed 6 years   08/04/2015 Surgery   72 y.o. [name] [name] woman status post left breast upper outer quadrant lumpectomy 08/04/2015 for a pT1c pNX, stage I A invasive ductal carcinoma, estrogen and progesterone receptor positive, HER-2 not amplified, with no HER-2 amplification, anterior margin was focally positive   08/04/2015 Oncotype testing   Oncotype DX score of 12 predicts a 10 year risk of recurrence outside the breast of 8% if the patient's only systemic therapy is tamoxifen for 5 years. It also predicts no benefit from therapy.      09/12/2015 Surgery   additional Left breast surgery 09/12/2015 cleared the margins and found all 5 axillary lymph nodes sampled clear for a final stage pT1c pN0, stage IA   10/22/2015 - 12/08/2015 Radiation Therapy   Adjuvant radiation 10/22/15 - 12/08/15              1) Left breast: 50.4 Gy in 28 fractions              2) Left breast boost: 10 Gy in 5 fractions   10/26/2021 Cancer Staging   Staging form: Breast, AJCC 7th Edition - Pathologic: Stage IA (T1c, N0, cM0) - Signed by [name] Socks, NP on 10/26/2021   Malignant neoplasm of right breast (HCC)  06/10/2021 Mammogram   asymmetry within the OUTER RIGHT breast on CC views warrants further evaluation.   Targeted  ultrasound : showing hypoechoic oval mass with indistinct margins in the 10:30 o'clock location of the RIGHT breast 9 centimeters from nipple measuring 0.5 x 0.4 x 0.4 centimeters. This area is approximately 1.8 centimeters from the palpable bruise, bruise was thought to be fat necrosis.   06/23/2021 Initial Diagnosis   Right breast needle core biopsy IDC, grade one, ER 90% positive, PR 60% positive, Ki67 2%, HER-2 IHC 2+, FISH negative   07/08/2021 Treatment Plan Change   Faslodex every 4 weeks, Abemaciclib to be added    08/12/2021 Surgery   Right lumpectomy: invasive ductal carcinoma with clear margins of resection, final pathology showed tumor measuring 1.1 cm x 0.7 x 8.7,  grade 3   previous biopsy showed ER 90% positive, PR 60% positive, Ki-67 was 2% HER2 2+ by IHC and negative by [facility]   08/12/2021 Oncotype testing   12   09/22/2021 - 10/30/2021 Radiation Therapy   Site Technique Total Dose (Gy) Dose per Fx (Gy) Completed Fx Beam Energies  Breast, Right: Breast_R 3D 45/45 1.8 25/25 15X, 10XFFF  Breast, Right: Breast_R_Bst 3D 6/6 2 3/3 10X, 15X       CURRENT THERAPY: Exemestane INTERVAL HISTORY: [name] [name] 72 y.o. female returns for f/u of her history of breast cancer.  She was previously on Faslodex every 4 weeks and struggled with side effects from this treatment.  She was changed by [name] [name] to exemestane back  in June.  She tells me she is tolerating this much better than the Faslodex.  She denies any increase in side effects and says that she actually feels like she has a little bit more energy.  Her most recent mammogram occurred on August 23, 2022 demonstrating no mammographic evidence of malignancy and breast density category B.  She is feeling well today from a breast cancer standpoint.  She has other medical issues going on and is following up with GI.   Patient Active Problem List   Diagnosis Date Noted   COVID-19 virus infection 09/17/2022   Immunosuppressed status  (HCC) 09/17/2022   Abnormal CT of the abdomen 08/09/2022   Paraumbilical hernia 07/29/2022   Esophageal varices in cirrhosis (HCC) 07/29/2022   Cirrhosis of liver without ascites (HCC) 07/12/2022   LUQ abdominal pain 07/05/2022   Drug-induced constipation 07/05/2022   Anxiety disorder 08/28/2021   Disorder of intervertebral disc of cervical spine 08/28/2021   Family history of malignant neoplasm of digestive organs 08/28/2021   Malignant neoplasm of right breast (HCC) 07/08/2021   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   Pseudophakia of both eyes 04/01/2021   Retinal edema 04/01/2021   Trichiasis of eyelid of both eyes 04/01/2021   Hypertrophy of inferior nasal turbinate 02/26/2021   Allergic rhinitis due to pollen 01/29/2021   Allergy to influenza vaccine 01/29/2021   Gout 01/29/2021   Hyperlipidemia 01/29/2021   Nonalcoholic steatohepatitis (NASH) 01/29/2021   Primary insomnia 01/29/2021   Recurrent major depression in remission (HCC) 01/29/2021   Arthritis of right ankle 04/14/2020   Nasal septal deviation 03/11/2020   Arthritis of right subtalar joint 10/09/2019   Primary osteoarthritis of right ankle 10/09/2019   Acquired pes planovalgus of right foot 10/09/2019   Acquired posterior equinus of right lower extremity 10/09/2019   Epistaxis, recurrent 09/26/2019   OSA on CPAP 09/26/2019   Acquired hypothyroidism 05/21/2019   Arthritis of shoulder 09/07/2018   Bilateral hearing loss 08/01/2018   Vertigo 08/01/2018   Chronic nonintractable headache 08/01/2018   Type 2 diabetes mellitus with diabetic polyneuropathy, with long-term current use of insulin (HCC) 11/16/2016   Bilateral lower extremity edema 09/01/2016   Diabetic polyneuropathy associated with type 2 diabetes mellitus (HCC) 06/03/2016   Malignant neoplasm of upper-outer quadrant of left breast in female, estrogen receptor positive (HCC) 08/19/2015   Osteoarthritis of right knee 02/28/2015   Status post total  right knee replacement 02/28/2015   Splenomegaly 07/30/2014   Thrombocytopenia (HCC) 07/30/2014   Recurrent falls 01/12/2013   Morbid obesity (HCC) 01/12/2013   Degenerative arthritis of spine 01/12/2013   Essential hypertension 09/05/2012    is allergic to codeine, influenza a (h1n1) monoval pf, hydrocodone-acetaminophen, lisinopril, metformin, pregabalin, and jardiance [empagliflozin].  MEDICAL HISTORY: Past Medical History:  Diagnosis Date   Abdominal wall contusion 12/09/2011   Achilles tendon contracture, bilateral 06/03/2016   Anxiety    Arthritis    Blood dyscrasia    low platelet count- sees [name] , Dr. Nelva Nay ( Note in EPIC from 07/2014) at [facility]   Breast cancer [facility]) 2017   Left Breast   Breast cancer (HCC) 07/2021   right breast IDC   Cancer (HCC)    left breast   Chest wall contusion 12/09/2011   Cirrhosis of liver (HCC)    Cough    Diabetes mellitus without complication (HCC)    Diabetic neuropathy (HCC)    Diarrhea due to drug 01/29/2021   Dyspnea    Gout  Gout    History of radiation therapy 10/22/15 - 12/08/15   left breast 50.4 Gy, boost to 10 Gy   History of radiation therapy    Right breast- 09/22/21-10/30/21- Dr. 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   Neuromuscular disorder (HCC)    diabetic neuropathy   Neuropathy    Personal history of radiation therapy 2017   Left Breast Cancer   Posterior tibial tendon dysfunction (PTTD) of right lower extremity 10/09/2019   Sleep apnea    CPAP nightly   Spleen enlarged    Thyroid disease     SURGICAL HISTORY: Past Surgical History:  Procedure Laterality Date   ANKLE FUSION Right 2022   BREAST BIOPSY Left 08/04/2015   Procedure: LEFT BREAST BIOPSY WITH NEEDLE LOCALIZATION;  Surgeon: Darnell Level, MD;  Location: [facility];  Service: General;  Laterality: Left;   BREAST BIOPSY Right 06/23/2021    BREAST DUCTAL SYSTEM EXCISION Left 08/04/2015   Procedure: LEFT EXCISION DUCTAL SYSTEM BREAST;  Surgeon: Darnell Level, MD;  Location: [facility];  Service: General;  Laterality: Left;   BREAST LUMPECTOMY Left 08/2015   BREAST LUMPECTOMY Right 08/2021   BREAST LUMPECTOMY WITH AXILLARY LYMPH NODE BIOPSY Left 09/12/2015   Procedure: RE-EXCISION OF LEFT BREAST LUMPECTOMY WITH LEFT AXILLARY LYMPH NODE BIOPSY;  Surgeon: Darnell Level, MD;  Location:  SURGERY CENTER;  Service: General;  Laterality: Left;   BREAST LUMPECTOMY WITH RADIOACTIVE SEED LOCALIZATION Right 08/12/2021   Procedure: RIGHT BREAST LUMPECTOMY WITH RADIOACTIVE SEED LOCALIZATION;  Surgeon: Abigail Miyamoto, MD;  Location:  SURGERY CENTER;  Service: General;  Laterality: Right;  LMA   CHOLECYSTECTOMY     EYE SURGERY Bilateral 2022   Lashes growing backwards into the eye   JOINT REPLACEMENT     left knee   JOINT REPLACEMENT  2017   right knee   REVERSE SHOULDER ARTHROPLASTY Right 09/07/2018   Procedure: RIGHT REVERSE SHOULDER ARTHROPLASTY;  Surgeon: Cammy Copa, MD;  Location: [facility];  Service: Orthopedics;  Laterality: Right;   TOTAL KNEE ARTHROPLASTY Right 02/28/2015   Procedure: RIGHT TOTAL KNEE ARTHROPLASTY;  Surgeon: Kathryne Hitch, MD;  Location: [facility];  Service: Orthopedics;  Laterality: Right;   TUBAL LIGATION      SOCIAL HISTORY: Social History   Socioeconomic History   Marital status: Married    Spouse name: edward   Number of children: 3   Years of education: 12th   Highest education level: Not on file  Occupational History   Occupation: Retired    Comment: Dance movement psychotherapist  Tobacco Use   Smoking status: Never   Smokeless tobacco: Never  Vaping Use   Vaping status: Never Used  Substance and Sexual Activity   Alcohol use: No   Drug use: No   Sexual activity: Yes    Birth control/protection: Surgical    Comment: BTL  Other Topics Concern   Not on file   Social History Narrative   Not on file   Social Determinants of Health   Financial Resource Strain: Low Risk  (02/11/2021)   Overall Financial Resource Strain (CARDIA)    Difficulty of Paying Living Expenses: Not very hard  Food Insecurity: No Food Insecurity (08/27/2021)   Hunger Vital Sign    Worried About Running Out of Food in the Last Year: Never true    Ran Out of Food in the Last Year: Never  true  Transportation Needs: No Transportation Needs (08/27/2021)   PRAPARE - Administrator, Civil Service (Medical): No    Lack of Transportation (Non-Medical): No  Physical Activity: Insufficiently Active (02/11/2021)   Exercise Vital Sign    Days of Exercise per Week: 3 days    Minutes of Exercise per Session: 30 min  Stress: No Stress Concern Present (02/11/2021)   [employer] of Occupational Health - Occupational Stress Questionnaire    Feeling of Stress : Only a little  Social Connections: Moderately Integrated (02/11/2021)   Social Connection and Isolation Panel [NHANES]    Frequency of Communication with Friends and Family: Three times a week    Frequency of Social Gatherings with Friends and Family: Never    Attends Religious Services: More than 4 times per year    Active Member of [employer] or Organizations: No    Attends Banker Meetings: Never    Marital Status: Married  Catering manager Violence: Not At Risk (02/11/2021)   Humiliation, Afraid, Rape, and Kick questionnaire    Fear of Current or Ex-Partner: No    Emotionally Abused: No    Physically Abused: No    Sexually Abused: No    FAMILY HISTORY: Family History  Problem Relation Age of Onset   Hypothyroidism Mother    Alzheimer's disease Mother    Diabetes Father    Cancer Sister        GYN cancer   Hypothyroidism Sister    Heart disease Brother    Arthritis Son    Diabetes Son    Arthritis Son    Cancer Maternal Grandmother    Heart failure Maternal Grandfather    Bone cancer  Paternal Grandmother    Heart failure Paternal Grandmother     Review of Systems  Constitutional:  Negative for appetite change, chills, fatigue, fever and unexpected weight change.  HENT:   Negative for hearing loss, lump/mass and trouble swallowing.   Eyes:  Negative for eye problems and icterus.  Respiratory:  Negative for chest tightness, cough and shortness of breath.   Cardiovascular:  Negative for chest pain, leg swelling and palpitations.  Gastrointestinal:  Negative for abdominal distention, abdominal pain, constipation, diarrhea, nausea and vomiting.  Endocrine: Negative for hot flashes.  Genitourinary:  Negative for difficulty urinating.   Musculoskeletal:  Negative for arthralgias.  Skin:  Negative for itching and rash.  Neurological:  Negative for dizziness, extremity weakness, headaches and numbness.  Hematological:  Negative for adenopathy. Does not bruise/bleed easily.  Psychiatric/Behavioral:  Negative for depression. The patient is not nervous/anxious.       PHYSICAL EXAMINATION   Onc Performance Status - 10/12/22 1103       ECOG Perf Status   ECOG Perf Status Capable of only limited selfcare, confined to bed or chair more than 50% of waking hours      KPS SCALE   KPS % SCORE Normal activity with effort, some s/s of disease             Vitals:   10/12/22 1057  BP: (!) 144/65  Pulse: 88  Resp: 18  Temp: 97.7 F (36.5 C)  SpO2: 98%    Physical Exam Constitutional:      General: She is not in acute distress.    Appearance: Normal appearance. She is not toxic-appearing.  HENT:     Head: Normocephalic and atraumatic.     Mouth/Throat:     Mouth: Mucous membranes are moist.  Pharynx: Oropharynx is clear. No oropharyngeal exudate or posterior oropharyngeal erythema.  Eyes:     General: No scleral icterus. Cardiovascular:     Rate and Rhythm: Normal rate and regular rhythm.     Pulses: Normal pulses.     Heart sounds: Normal heart sounds.   Pulmonary:     Effort: Pulmonary effort is normal.     Breath sounds: Normal breath sounds.  Chest:     Comments: Declined today Abdominal:     General: Abdomen is flat. Bowel sounds are normal. There is no distension.     Palpations: Abdomen is soft.     Tenderness: There is no abdominal tenderness.  Musculoskeletal:        General: No swelling.     Cervical back: Neck supple.  Lymphadenopathy:     Cervical: No cervical adenopathy.  Skin:    General: Skin is warm and dry.     Findings: No rash.  Neurological:     General: No focal deficit present.     Mental Status: She is alert.  Psychiatric:        Mood and Affect: Mood normal.        Behavior: Behavior normal.      ASSESSMENT and THERAPY PLAN:   Malignant neoplasm of right breast [facility]) [name] [name] is a 72 year old woman with history of left-sided breast cancer from 2017 status posttreatment with more recent right sided breast cancer diagnosed in [place] 2023, ER/PR positive, HER2 negative.  She is status postlumpectomy and adjuvant radiation and continues on exemestane daily that she began in June 2024.  Stage Ia right breast cancer: She has no clinical or radiographic signs of breast cancer recurrence.  She will continue on exemestane daily as she is tolerating it well.  Her next mammogram is due in July 2025. Bone health: Her most recent bone density is from 2021 and is completely normal.  Considering the fact that she has other health issues and procedures going on over the next few months I suggested that we order this in 6 months when she returns for her follow-up.  We will see [name] [name] back in 6 months for follow-up for her breast cancer.  She knows to call for any questions or concerns that Mcdaris arise between now and then.    All questions were answered. The patient knows to call the clinic with any problems, questions or concerns. We can certainly see the patient much sooner if necessary.  Total encounter time:30  minutes*in face-to-face visit time, chart review, lab review, care coordination, order entry, and documentation of the encounter time.    Lillard Anes, NP 10/12/22 12:04 PM Medical Oncology and Hematology [facility] [address] [place], [place] [address] Tel. [phone]    Fax. [phone]  *Total Encounter Time as defined by the Centers for Medicare and Medicaid Services includes, in addition to the face-to-face time of a patient visit (documented in the note above) non-face-to-face time: obtaining and reviewing outside history, ordering and reviewing medications, tests or procedures, care coordination (communications with other health care professionals or caregivers) and documentation in the medical record.

## 2022-10-13 ENCOUNTER — Encounter (HOSPITAL_COMMUNITY)
Admission: RE | Admit: 2022-10-13 | Discharge: 2022-10-13 | Disposition: A | Discharge status: Home / Self Care | Payer: Medicare Other | Source: Ambulatory Visit | Attending: Gastroenterology | Admitting: Gastroenterology

## 2022-10-13 VITALS — BP 144/65 | HR 88 | Temp 97.7°F | Resp 18 | Ht 61.0 in | Wt 248.0 lb

## 2022-10-13 DIAGNOSIS — K7030 Alcoholic cirrhosis of liver without ascites: Secondary | ICD-10-CM | POA: Insufficient documentation

## 2022-10-13 DIAGNOSIS — I10 Essential (primary) hypertension: Secondary | ICD-10-CM | POA: Diagnosis not present

## 2022-10-13 DIAGNOSIS — Z794 Long term (current) use of insulin: Secondary | ICD-10-CM | POA: Insufficient documentation

## 2022-10-13 DIAGNOSIS — E1142 Type 2 diabetes mellitus with diabetic polyneuropathy: Secondary | ICD-10-CM | POA: Diagnosis not present

## 2022-10-13 DIAGNOSIS — Z01818 Encounter for other preprocedural examination: Secondary | ICD-10-CM | POA: Insufficient documentation

## 2022-10-13 LAB — CBC WITH DIFFERENTIAL/PLATELET
Abs Immature Granulocytes: 0 10*3/uL (ref 0.00–0.07)
Basophils Absolute: 0 10*3/uL (ref 0.0–0.1)
Basophils Relative: 1 %
Eosinophils Absolute: 0.1 10*3/uL (ref 0.0–0.5)
Eosinophils Relative: 3 %
HCT: 40.7 % (ref 36.0–46.0)
Hemoglobin: 13.1 g/dL (ref 12.0–15.0)
Immature Granulocytes: 0 %
Lymphocytes Relative: 21 %
Lymphs Abs: 0.6 10*3/uL — ABNORMAL LOW (ref 0.7–4.0)
MCH: 29.4 pg (ref 26.0–34.0)
MCHC: 32.2 g/dL (ref 30.0–36.0)
MCV: 91.5 fL (ref 80.0–100.0)
Monocytes Absolute: 0.3 10*3/uL (ref 0.1–1.0)
Monocytes Relative: 9 %
Neutro Abs: 1.9 10*3/uL (ref 1.7–7.7)
Neutrophils Relative %: 66 %
Platelets: 63 10*3/uL — ABNORMAL LOW (ref 150–400)
RBC: 4.45 MIL/uL (ref 3.87–5.11)
RDW: 15.6 % — ABNORMAL HIGH (ref 11.5–15.5)
WBC: 2.9 10*3/uL — ABNORMAL LOW (ref 4.0–10.5)
nRBC: 0 % (ref 0.0–0.2)

## 2022-10-13 LAB — BASIC METABOLIC PANEL
Anion gap: 10 (ref 5–15)
BUN: 16 mg/dL (ref 8–23)
CO2: 23 mmol/L (ref 22–32)
Calcium: 9.7 mg/dL (ref 8.9–10.3)
Chloride: 103 mmol/L (ref 98–111)
Creatinine, Ser: 0.77 mg/dL (ref 0.44–1.00)
GFR, Estimated: >60 mL/min (ref >60)
Glucose, Bld: 148 mg/dL — ABNORMAL HIGH (ref 70–99)
Potassium: 3.5 mmol/L (ref 3.5–5.1)
Sodium: 136 mmol/L (ref 135–145)

## 2022-10-18 ENCOUNTER — Telehealth: Payer: Self-pay | Admitting: *Deleted

## 2022-10-18 ENCOUNTER — Ambulatory Visit (INDEPENDENT_AMBULATORY_CARE_PROVIDER_SITE_OTHER): Payer: Medicare Other

## 2022-10-18 VITALS — BP 130/82 | Ht 61.0 in | Wt 248.0 lb

## 2022-10-18 DIAGNOSIS — Z741 Need for assistance with personal care: Secondary | ICD-10-CM

## 2022-10-18 DIAGNOSIS — Z0000 Encounter for general adult medical examination without abnormal findings: Secondary | ICD-10-CM | POA: Diagnosis not present

## 2022-10-18 NOTE — Progress Notes (Signed)
Care Coordination  Outreach Note  10/18/2022 Name: Romonia Klindt Datta MRN: [id number] DOB: 03-02-1950   Care Coordination Outreach Attempts: An unsuccessful telephone outreach was attempted today to offer the patient information about available care coordination services.  Follow Up Plan:  Additional outreach attempts will be made to offer the patient care coordination information and services.   Encounter Outcome:  No Answer   Gwenevere Ghazi  Care Coordination Care Guide  Direct Dial: [phone]

## 2022-10-18 NOTE — Patient Instructions (Addendum)
[name] , Thank you for taking time to come for your Medicare Wellness Visit. I appreciate your ongoing commitment to your health goals. Please review the following plan we discussed and let me know if I can assist you in the future.   Referrals/Orders/Follow-Ups/Clinician Recommendations:  A referral has been placed for you to see if there are any additional resources to help you with one of the following:     []   Transportation Needs   []   Utility Needs   []   Food Insecurity   [x]  Assistance with daily activities such as bath, dressing, and managing your medications   []  Housing Insecurity If you haven't heard from anyone within the next 7 business days, please call them and let them know a referral has been placed  Concierge Line: [phone]   This is a list of the screening recommended for you and due dates:  Health Maintenance  Topic Date Due   COVID-19 Vaccine (1) Never done   Zoster (Shingles) Vaccine (1 of 2) Never done   Colon Cancer Screening  Never done   Pneumonia Vaccine (1 of 1 - PCV) Never done   DTaP/Tdap/Td vaccine (2 - Td or Tdap) 03/20/2022   Flu Shot  Never done   Hemoglobin A1C  10/16/2022   Eye exam for diabetics  01/16/2023   Complete foot exam   06/29/2023   Yearly kidney health urinalysis for diabetes  07/29/2023   Mammogram  08/23/2023   Yearly kidney function blood test for diabetes  10/13/2023   Medicare Annual Wellness Visit  10/18/2023   DEXA scan (bone density measurement)  07/08/2024   Hepatitis C Screening  Completed   HPV Vaccine  Aged Out    Advanced directives: (Provided) Advance directive discussed with you today. I have provided a copy for you to complete at home and have notarized. Once this is complete, please bring a copy in to our office so we can scan it into your chart.   Next Medicare Annual Wellness Visit scheduled for next year: Yes  Your next Annual Wellness Visit will be on December 21, 2023 at 8:40am. This will be a virtual  visit.   Preventive Care 61 Years and Older, Female Preventive care refers to lifestyle choices and visits with your health care provider that can promote health and wellness. Preventive care visits are also called wellness exams. What can I expect for my preventive care visit? Counseling Your health care provider Jeff ask you questions about your: Medical history, including: Past medical problems. Family medical history. Pregnancy and menstrual history. History of falls. Current health, including: Memory and ability to understand (cognition). Emotional well-being. Home life and relationship well-being. Sexual activity and sexual health. Lifestyle, including: Alcohol, nicotine or tobacco, and drug use. Access to firearms. Diet, exercise, and sleep habits. Work and work Astronomer. Sunscreen use. Safety issues such as seatbelt and bike helmet use. Physical exam Your health care provider will check your: Height and weight. These Serano be used to calculate your BMI (body mass index). BMI is a measurement that tells if you are at a healthy weight. Waist circumference. This measures the distance around your waistline. This measurement also tells if you are at a healthy weight and Missey help predict your risk of certain diseases, such as type 2 diabetes and high blood pressure. Heart rate and blood pressure. Body temperature. Skin for abnormal spots. What immunizations do I need?  Vaccines are usually given at various ages, according to a schedule. Your  health care provider will recommend vaccines for you based on your age, medical history, and lifestyle or other factors, such as travel or where you work. What tests do I need? Screening Your health care provider Teska recommend screening tests for certain conditions. This [place] include: Lipid and cholesterol levels. Hepatitis C test. Hepatitis B test. HIV (human immunodeficiency virus) test. STI (sexually transmitted infection) testing, if  you are at risk. Lung cancer screening. Colorectal cancer screening. Diabetes screening. This is done by checking your blood sugar (glucose) after you have not eaten for a while (fasting). Mammogram. Talk with your health care provider about how often you should have regular mammograms. BRCA-related cancer screening. This Liles be done if you have a family history of breast, ovarian, tubal, or peritoneal cancers. Bone density scan. This is done to screen for osteoporosis. Talk with your health care provider about your test results, treatment options, and if necessary, the need for more tests. Follow these instructions at home: Eating and drinking  Eat a diet that includes fresh fruits and vegetables, whole grains, lean protein, and low-fat dairy products. Limit your intake of foods with high amounts of sugar, saturated fats, and salt. Take vitamin and mineral supplements as recommended by your health care provider. Do not drink alcohol if your health care provider tells you not to drink. If you drink alcohol: Limit how much you have to 0-1 drink a day. Know how much alcohol is in your drink. In the [place] one drink equals one 12 oz bottle of beer (355 mL), one 5 oz glass of wine (148 mL), or one 1 oz glass of hard liquor (44 mL). Lifestyle Brush your teeth every morning and night with fluoride toothpaste. Floss one time each day. Exercise for at least 30 minutes 5 or more days each week. Do not use any products that contain nicotine or tobacco. These products include cigarettes, chewing tobacco, and vaping devices, such as e-cigarettes. If you need help quitting, ask your health care provider. Do not use drugs. If you are sexually active, practice safe sex. Use a condom or other form of protection in order to prevent STIs. Take aspirin only as told by your health care provider. Make sure that you understand how much to take and what form to take. Work with your health care provider to find out  whether it is safe and beneficial for you to take aspirin daily. Ask your health care provider if you need to take a cholesterol-lowering medicine (statin). Find healthy ways to manage stress, such as: Meditation, yoga, or listening to music. Journaling. Talking to a trusted person. Spending time with friends and family. Minimize exposure to UV radiation to reduce your risk of skin cancer. Safety Always wear your seat belt while driving or riding in a vehicle. Do not drive: If you have been drinking alcohol. Do not ride with someone who has been drinking. When you are tired or distracted. While texting. If you have been using any mind-altering substances or drugs. Wear a helmet and other protective equipment during sports activities. If you have firearms in your house, make sure you follow all gun safety procedures. What's next? Visit your health care provider once a year for an annual wellness visit. Ask your health care provider how often you should have your eyes and teeth checked. Stay up to date on all vaccines. This information is not intended to replace advice given to you by your health care provider. Make sure you discuss any questions you  have with your health care provider. Document Revised: 07/16/2020 Document Reviewed: 07/16/2020 Elsevier Patient Education  2024 [employer]. Understanding Your Risk for Falls Millions of people have serious injuries from falls each year. It is important to understand your risk of falling. Talk with your health care provider about your risk and what you can do to lower it. If you do have a serious fall, make sure to tell your provider. Falling once raises your risk of falling again. How can falls affect me? Serious injuries from falls are common. These include: Broken bones, such as hip fractures. Head injuries, such as traumatic brain injuries (TBI) or concussions. A fear of falling can cause you to avoid activities and stay at home. This  can make your muscles weaker and raise your risk for a fall. What can increase my risk? There are a number of risk factors that increase your risk for falling. The more risk factors you have, the higher your risk of falling. Serious injuries from a fall happen most often to people who are older than 72 years old. Teenagers and young adults ages 65-29 are also at higher risk. Common risk factors include: Weakness in the lower body. Being generally weak or confused due to long-term (chronic) illness. Dizziness or balance problems. Poor vision. Medicines that cause dizziness or drowsiness. These [place] include: Medicines for your blood pressure, heart, anxiety, insomnia, or swelling (edema). Pain medicines. Muscle relaxants. Other risk factors include: Drinking alcohol. Having had a fall in the past. Having foot pain or wearing improper footwear. Working at a dangerous job. Having any of the following in your home: Tripping hazards, such as floor clutter or loose rugs. Poor lighting. Pets. Having dementia or memory loss. What actions can I take to lower my risk of falling?     Physical activity Stay physically fit. Do strength and balance exercises. Consider taking a regular class to build strength and balance. Yoga and tai chi are good options. Vision Have your eyes checked every year and your prescription for glasses or contacts updated as needed. Shoes and walking aids Wear non-skid shoes. Wear shoes that have rubber soles and low heels. Do not wear high heels. Do not walk around the house in socks or slippers. Use a cane or walker as told by your provider. Home safety Attach secure railings on both sides of your stairs. Install grab bars for your bathtub, shower, and toilet. Use a non-skid mat in your bathtub or shower. Attach bath mats securely with double-sided, non-slip rug tape. Use good lighting in all rooms. Keep a flashlight near your bed. Make sure there is a clear path  from your bed to the bathroom. Use night-lights. Do not use throw rugs. Make sure all carpeting is taped or tacked down securely. Remove all clutter from walkways and stairways, including extension cords. Repair uneven or broken steps and floors. Avoid walking on icy or slippery surfaces. Walk on the grass instead of on icy or slick sidewalks. Use ice melter to get rid of ice on walkways in the winter. Use a cordless phone. Questions to ask your health care provider Can you help me check my risk for a fall? Do any of my medicines make me more likely to fall? Should I take a vitamin D supplement? What exercises can I do to improve my strength and balance? Should I make an appointment to have my vision checked? Do I need a bone density test to check for weak bones (osteoporosis)? Would it help  to use a cane or a walker? Where to find more information Centers for Disease Control and Prevention, [employer]: TonerPromos.no Community-Based Fall Prevention Programs: TonerPromos.no [employer] on Aging: BaseRingTones.pl Contact a health care provider if: You fall at home. You are afraid of falling at home. You feel weak, drowsy, or dizzy. This information is not intended to replace advice given to you by your health care provider. Make sure you discuss any questions you have with your health care provider. Document Revised: 09/21/2021 Document Reviewed: 09/21/2021 Elsevier Patient Education  2024 [employer].

## 2022-10-18 NOTE — Progress Notes (Signed)
Because this visit was a virtual/telehealth visit,  certain criteria was not obtained, such a blood pressure, CBG if applicable, and timed get up and go. Any medications not marked as "taking" were not mentioned during the medication reconciliation part of the visit. Any vitals not documented were not able to be obtained due to this being a telehealth visit or patient was unable to self-report a recent blood pressure reading due to a lack of equipment at home via telehealth. Vitals that have been documented are verbally provided by the patient.   Subjective:   [name] [name] is a 72 y.o. female who presents for Medicare Annual (Subsequent) preventive examination.  Visit Complete: Virtual  I connected with  [name] [name] on 10/18/22 by a audio enabled telemedicine application and verified that I am speaking with the correct person using two identifiers.  Patient Location: Home  Provider Location: Home Office  I discussed the limitations of evaluation and management by telemedicine. The patient expressed understanding and agreed to proceed.  Patient Medicare AWV questionnaire was completed by the patient on na; I have confirmed that all information answered by patient is correct and no changes since this date.  Cardiac Risk Factors include: advanced age (>41men, >80 women);diabetes mellitus;dyslipidemia;hypertension;sedentary lifestyle;obesity (BMI >30kg/m2)     Objective:    Today's Vitals   10/18/22 0819 10/18/22 0820  BP: 130/82   Weight: 248 lb (112.5 kg)   Height: 5\' 1"  (1.549 m)   PainSc:  10-Worst pain ever   Body mass index is 46.86 kg/m.     10/18/2022    8:19 AM 10/13/2022   10:33 AM 10/12/2022   11:03 AM 12/17/2021   10:36 AM 10/26/2021   11:24 AM 09/26/2021    7:23 AM 09/07/2021    1:05 PM  Advanced Directives  Does Patient Have a Medical Advance Directive? No No No No No Yes No  Does patient want to make changes to medical advance directive?      No - Patient declined    Would patient like information on creating a medical advance directive? Yes (MAU/Ambulatory/Procedural Areas - Information given) No - Patient declined  No - Patient declined       Current Medications (verified) Outpatient Encounter Medications as of 10/18/2022  Medication Sig   ACCU-CHEK AVIVA PLUS test strip USE AS INSTRUCTED TO CHECK BLOOD SUGAR 2 TIMES DAILY   acetaminophen (TYLENOL) 500 MG tablet Take by mouth as needed.   albuterol (VENTOLIN HFA) 108 (90 Base) MCG/ACT inhaler Inhale 1-2 puffs into the lungs every 4 (four) hours as needed for wheezing or shortness of breath.   Ascorbic Acid (VITAMIN C PO) Take by mouth daily.   buPROPion (WELLBUTRIN XL) 150 MG 24 hr tablet TAKE 1 TABLET BY MOUTH EVERY DAY IN THE MORNING   Cyanocobalamin (VITAMIN B-12 PO) Take by mouth daily.   exemestane (AROMASIN) 25 MG tablet Take 1 tablet (25 mg total) by mouth daily after breakfast.   FARXIGA 10 MG TABS tablet TAKE 1 TABLET BY MOUTH DAILY BEFORE BREAKFAST.   fluticasone (FLONASE) 50 MCG/ACT nasal spray Place 1 spray into both nostrils as needed.    gabapentin (NEURONTIN) 600 MG tablet TAKE 1 TABLET IN THE MORNING AND 1 TABLET IN THE AFTERNOON AND 2TABS AT BEDTIME   guaiFENesin-codeine (CHERATUSSIN AC) 100-10 MG/5ML syrup Take 5 mLs by mouth 3 (three) times daily as needed for cough.   insulin glargine (LANTUS SOLOSTAR) 100 UNIT/ML Solostar Pen Inject 62 Units into the skin daily.  Insulin Pen Needle (B-D UF III MINI PEN NEEDLES) 31G X 5 MM MISC USE AS DIRECTED WITH INSULIN EVERY MORNING, NOON, EVENING AND AT BEDTIME   Insulin Pen Needle 30G X 5 MM MISC 1 Device by Does not apply route in the morning, at noon, in the evening, and at bedtime.   ketoconazole (NIZORAL) 2 % cream Apply 1 Application topically daily.   levothyroxine (SYNTHROID) 150 MCG tablet Take 1 tablet (150 mcg total) by mouth daily.   losartan (COZAAR) 25 MG tablet Take 1 tablet (25 mg total) by mouth daily.   methocarbamol (ROBAXIN)  500 MG tablet Take 1 tablet (500 mg total) by mouth every 8 (eight) hours as needed for muscle spasms.   mirabegron ER (MYRBETRIQ) 25 MG TB24 tablet Take 1 tablet (25 mg total) by mouth daily.   Multiple Vitamins-Minerals (MULTIVITAMIN WITH MINERALS) tablet Take 1 tablet by mouth daily.   mycophenolate (CELLCEPT) 500 MG tablet Take 1,500 mg by mouth 2 (two) times daily.   promethazine-dextromethorphan (PROMETHAZINE-DM) 6.25-15 MG/5ML syrup Take 5 mLs by mouth 4 (four) times daily as needed for cough.   Pyridoxine HCl (VITAMIN B-6 PO) Take by mouth daily.   Semaglutide, 1 MG/DOSE, (OZEMPIC, 1 MG/DOSE,) 4 MG/3ML SOPN Inject 1 mg into the skin once a week.   UNABLE TO FIND 1 each by Does not apply route daily. Med Name: POWER WHEELCHAIR  DX CODE-R26.89   UNABLE TO FIND 1 each by Does not apply route daily. Med Name: POWER SCOOTER DX CODE-R26.89   VITAMIN D PO Take by mouth daily.   No facility-administered encounter medications on file as of 10/18/2022.    Allergies (verified) Codeine, Influenza a (h1n1) monoval pf, Hydrocodone-acetaminophen, Lisinopril, Metformin, Pregabalin, and Jardiance [empagliflozin]   History: Past Medical History:  Diagnosis Date   Abdominal wall contusion 12/09/2011   Achilles tendon contracture, bilateral 06/03/2016   Anxiety    Arthritis    Blood dyscrasia    low platelet count- sees [name] , Dr. Nelva Nay ( Note in EPIC from 07/2014) at [facility]   Breast cancer [facility]) 2017   Left Breast   Breast cancer (HCC) 07/2021   right breast IDC   Cancer (HCC)    left breast   Chest wall contusion 12/09/2011   Cirrhosis of liver (HCC)    Cough    Diabetes mellitus without complication (HCC)    Diabetic neuropathy (HCC)    Diarrhea due to drug 01/29/2021   Dyspnea    Gout    Gout    History of radiation therapy 10/22/15 - 12/08/15   left breast 50.4 Gy, boost to 10 Gy   History of radiation therapy    Right breast- 09/22/21-10/30/21- Dr.  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   Neuromuscular disorder (HCC)    diabetic neuropathy   Neuropathy    Personal history of radiation therapy 2017   Left Breast Cancer   Posterior tibial tendon dysfunction (PTTD) of right lower extremity 10/09/2019   Sleep apnea    CPAP nightly   Spleen enlarged    Thyroid disease    Past Surgical History:  Procedure Laterality Date   ANKLE FUSION Right 2022   BREAST BIOPSY Left 08/04/2015   Procedure: LEFT BREAST BIOPSY WITH NEEDLE LOCALIZATION;  Surgeon: Darnell Level, MD;  Location: [facility];  Service: General;  Laterality: Left;   BREAST BIOPSY Right 06/23/2021  BREAST DUCTAL SYSTEM EXCISION Left 08/04/2015   Procedure: LEFT EXCISION DUCTAL SYSTEM BREAST;  Surgeon: Darnell Level, MD;  Location: [facility];  Service: General;  Laterality: Left;   BREAST LUMPECTOMY Left 08/2015   BREAST LUMPECTOMY Right 08/2021   BREAST LUMPECTOMY WITH AXILLARY LYMPH NODE BIOPSY Left 09/12/2015   Procedure: RE-EXCISION OF LEFT BREAST LUMPECTOMY WITH LEFT AXILLARY LYMPH NODE BIOPSY;  Surgeon: Darnell Level, MD;  Location: [facility];  Service: General;  Laterality: Left;   BREAST LUMPECTOMY WITH RADIOACTIVE SEED LOCALIZATION Right 08/12/2021   Procedure: RIGHT BREAST LUMPECTOMY WITH RADIOACTIVE SEED LOCALIZATION;  Surgeon: Abigail Miyamoto, MD;  Location: [facility];  Service: General;  Laterality: Right;  LMA   CHOLECYSTECTOMY     EYE SURGERY Bilateral 2022   Lashes growing backwards into the eye   JOINT REPLACEMENT     left knee   JOINT REPLACEMENT  2017   right knee   REVERSE SHOULDER ARTHROPLASTY Right 09/07/2018   Procedure: RIGHT REVERSE SHOULDER ARTHROPLASTY;  Surgeon: Cammy Copa, MD;  Location: [facility];  Service: Orthopedics;  Laterality: Right;   TOTAL KNEE ARTHROPLASTY Right 02/28/2015   Procedure: RIGHT TOTAL KNEE  ARTHROPLASTY;  Surgeon: Kathryne Hitch, MD;  Location: [facility];  Service: Orthopedics;  Laterality: Right;   TUBAL LIGATION     Family History  Problem Relation Age of Onset   Hypothyroidism Mother    Alzheimer's disease Mother    Diabetes Father    Cancer Sister        GYN cancer   Hypothyroidism Sister    Heart disease Brother    Arthritis Son    Diabetes Son    Arthritis Son    Cancer Maternal Grandmother    Heart failure Maternal Grandfather    Bone cancer Paternal Grandmother    Heart failure Paternal Grandmother    Social History   Socioeconomic History   Marital status: Married    Spouse name: edward   Number of children: 3   Years of education: 12th   Highest education level: Not on file  Occupational History   Occupation: Retired    Comment: Dance movement psychotherapist  Tobacco Use   Smoking status: Never   Smokeless tobacco: Never  Vaping Use   Vaping status: Never Used  Substance and Sexual Activity   Alcohol use: No   Drug use: No   Sexual activity: Yes    Birth control/protection: Surgical    Comment: BTL  Other Topics Concern   Not on file  Social History Narrative   Not on file   Social Determinants of Health   Financial Resource Strain: Low Risk  (10/18/2022)   Overall Financial Resource Strain (CARDIA)    Difficulty of Paying Living Expenses: Not very hard  Food Insecurity: No Food Insecurity (10/18/2022)   Hunger Vital Sign    Worried About Running Out of Food in the Last Year: Never true    Ran Out of Food in the Last Year: Never true  Transportation Needs: No Transportation Needs (10/18/2022)   PRAPARE - Administrator, Civil Service (Medical): No    Lack of Transportation (Non-Medical): No  Physical Activity: Insufficiently Active (10/18/2022)   Exercise Vital Sign    Days of Exercise per Week: 7 days    Minutes of Exercise per Session: 20 min  Stress: No Stress Concern Present (10/18/2022)   [employer] of Occupational  Health - Occupational Stress Questionnaire  Feeling of Stress : Only a little  Social Connections: Moderately Isolated (10/18/2022)   Social Connection and Isolation Panel [NHANES]    Frequency of Communication with Friends and Family: More than three times a week    Frequency of Social Gatherings with Friends and Family: More than three times a week    Attends Religious Services: Never    Database administrator or Organizations: No    Attends Engineer, structural: Never    Marital Status: Married    Tobacco Counseling Counseling given: Yes   Clinical Intake:  Pre-visit preparation completed: Yes  Pain : 0-10 Pain Score: 10-Worst pain ever Pain Type: Acute pain Pain Location: Arm (patient fell on 10/15/2022 now arm is hurting) Pain Orientation: Right Pain Descriptors / Indicators: Constant, Throbbing, Shooting, Sharp (pain increases with use such as combing hair) Pain Onset: In the past 7 days Pain Frequency: Constant     BMI - recorded: 46.86 Nutritional Status: BMI > 30  Obese Nutritional Risks: None Diabetes: Yes CBG done?: No (telehealth visit. unable to obtain cbg) Did pt. bring in CBG monitor from home?: No  How often do you need to have someone help you when you read instructions, pamphlets, or other written materials from your doctor or pharmacy?: 1 - Never  Interpreter Needed?: No  Information entered by :: Abby Rossanna Spitzley, CMA   Activities of Daily Living    10/18/2022    8:34 AM 10/13/2022   10:36 AM  In your present state of health, do you have any difficulty performing the following activities:  Hearing? 1   Difficulty concentrating or making decisions? 0   Walking or climbing stairs? 1   Comment hx of ankle surgery uses cane   Dressing or bathing? 1   Comment husband helps with this   Doing errands, shopping? 1 0  Comment husband drives her   Preparing Food and eating ? Y   Comment husband does this   Using the Toilet? N   In the past  six months, have you accidently leaked urine? N   Do you have problems with loss of bowel control? N   Managing your Medications? N   Managing your Finances? N   Housekeeping or managing your Housekeeping? Y   Comment husband does this     Patient Care Team: Anabel Halon, MD as PCP - General (Internal Medicine) Donnelly Angelica, RN as Oncology Nurse Navigator Pershing Proud, RN as Oncology Nurse Navigator Rachel Moulds, MD as Consulting Physician (Hematology and Oncology) Juanell Fairly, RN as Case Manager Antony Blackbird, MD as Consulting Physician (Radiation Oncology) Abigail Miyamoto, MD as Consulting Physician (General Surgery) Vivi Barrack, DPM as Consulting Physician (Podiatry) Eber Jones, MD as Referring Physician (Ophthalmology) Butch Penny, NP as Registered Nurse (Neurology) Marguerita Merles, Reuel Boom, MD as Consulting Physician (Gastroenterology) Ocie Cornfield, MD as Referring Physician (Internal Medicine) Clinger, Beulah Gandy, MD (Otolaryngology)  Indicate any recent Medical Services you Hodder have received from other than [facility] providers in the past year (date Grieser be approximate).     Assessment:   This is a routine wellness examination for Carrye.  Hearing/Vision screen Hearing Screening - Comments:: Patient c/o difficulty hearing bilateral but declines referral for right now.  Vision Screening - Comments:: Wears rx glasses - up to date with routine eye exams with Dr. [facility]ll [facility] at [facility]    Goals Addressed             This Visit's Progress  Patient Stated       Make it one day at a time       Depression Screen    10/18/2022    8:26 AM 07/29/2022    3:33 PM 07/05/2022    1:05 PM 05/19/2022    2:40 PM 02/03/2022   10:01 AM 11/03/2021    2:23 PM 10/12/2021    1:41 PM  PHQ 2/9 Scores  PHQ - 2 Score 4 2 2  0 1 0 0  PHQ- 9 Score 10 8 8    0 0    Fall Risk    10/18/2022    8:50 AM 07/29/2022    3:33 PM 07/05/2022    1:04 PM 05/19/2022    2:40 PM  02/03/2022   10:01 AM  Fall Risk   Falls in the past year? 1 1 1  0 1  Number falls in past yr: 1 0 1 0 0  Injury with Fall? 1 1 1  0 1  Risk for fall due to : History of fall(s);Impaired balance/gait;Orthopedic patient      Follow up Education provided;Falls prevention discussed        MEDICARE RISK AT HOME: Medicare Risk at Home Any stairs in or around the home?: No If so, are there any without handrails?: No Home free of loose throw rugs in walkways, pet beds, electrical cords, etc?: Yes Adequate lighting in your home to reduce risk of falls?: Yes Life alert?: No Use of a cane, walker or w/c?: Yes Grab bars in the bathroom?: Yes Shower chair or bench in shower?: Yes Elevated toilet seat or a handicapped toilet?: Yes  TIMED UP AND GO:  Was the test performed?  No    Cognitive Function:        10/18/2022    8:49 AM 02/11/2021   10:05 AM  6CIT Screen  What Year? 0 points 0 points  What month? 0 points 0 points  What time? 0 points 0 points  Count back from 20 0 points 0 points  Months in reverse 0 points 0 points  Repeat phrase 0 points 2 points  Total Score 0 points 2 points    Immunizations Immunization History  Administered Date(s) Administered   Hepatitis B, ADULT 03/01/2014, 04/01/2014, 08/30/2014   Tdap 03/20/2012    TDAP status: Due, Education has been provided regarding the importance of this vaccine. Advised [place] receive this vaccine at local pharmacy or Health Dept. Aware to provide a copy of the vaccination record if obtained from local pharmacy or Health Dept. Verbalized acceptance and understanding.  Flu Vaccine status: Declined, Education has been provided regarding the importance of this vaccine but patient still declined. Advised R[place]on receive this vaccine at local pharmacy or Health Dept. Aware to provide a copy of the vaccination record if obtained from local pharmacy or Health Dept. Verbalized acceptance and understanding.  Pneumococcal vaccine status:  Declined,  Education has been provided regarding the importance of this vaccine but patient still declined. Advised K[place]ler receive this vaccine at local pharmacy or Health Dept. Aware to provide a copy of the vaccination record if obtained from local pharmacy or Health Dept. Verbalized acceptance and understanding.   Covid-19 vaccine status: Declined, Education has been provided regarding the importance of this vaccine but patient still declined. Advised Duva receive this vaccine at local pharmacy or Health Dept.or vaccine clinic. Aware to provide a copy of the vaccination record if obtained from local pharmacy or Health Dept. Verbalized acceptance and understanding.  Qualifies for Shingles  Vaccine? Yes   Patient declined Shingrix  Screening Tests Health Maintenance  Topic Date Due   COVID-19 Vaccine (1) Never done   Zoster Vaccines- Shingrix (1 of 2) Never done   Colonoscopy  Never done   Pneumonia Vaccine 68+ Years old (1 of 1 - PCV) Never done   Medicare Annual Wellness (AWV)  02/11/2022   DTaP/Tdap/Td (2 - Td or Tdap) 03/20/2022   INFLUENZA VACCINE  Never done   HEMOGLOBIN A1C  10/16/2022   OPHTHALMOLOGY EXAM  01/16/2023   FOOT EXAM  06/29/2023   Diabetic kidney evaluation - Urine ACR  07/29/2023   MAMMOGRAM  08/23/2023   Diabetic kidney evaluation - eGFR measurement  10/13/2023   DEXA SCAN  07/08/2024   Hepatitis C Screening  Completed   HPV VACCINES  Aged Out    Health Maintenance  Health Maintenance Due  Topic Date Due   COVID-19 Vaccine (1) Never done   Zoster Vaccines- Shingrix (1 of 2) Never done   Colonoscopy  Never done   Pneumonia Vaccine 61+ Years old (1 of 1 - PCV) Never done   Medicare Annual Wellness (AWV)  02/11/2022   DTaP/Tdap/Td (2 - Td or Tdap) 03/20/2022   INFLUENZA VACCINE  Never done   HEMOGLOBIN A1C  10/16/2022    Colorectal Cancer Screening: Patient declined colorectal cancer screening   Mammogram status: Completed 08/23/2022. Repeat every  year  Bone Density status: Completed 07/09/2019. Results reflect: Bone density results: NORMAL. Repeat every 5 years.  Lung Cancer Screening: (Low Dose CT Chest recommended if Age 76-80 years, 20 pack-year currently smoking OR have quit w/in 15years.) does not qualify.   Additional Screening:  Hepatitis C Screening: does not qualify; Completed 04/01/2021  Vision Screening: Recommended annual ophthalmology exams for early detection of glaucoma and other disorders of the eye. Is the patient up to date with their annual eye exam?  Yes  Who is the provider or what is the name of the office in which the patient attends annual eye exams? Dr. Sherryll Burger at [facility] Screening: Recommended annual dental exams for proper oral hygiene  Diabetic Foot Exam: Diabetic Foot Exam: Completed 06/29/2022  Community Resource Referral / Chronic Care Management: CRR required this visit?  Yes   CCM required this visit?  No     Plan:     I have personally reviewed and noted the following in the patient's chart:   Medical and social history Use of alcohol, tobacco or illicit drugs  Current medications and supplements including opioid prescriptions. Patient is not currently taking opioid prescriptions. Functional ability and status Nutritional status Physical activity Advanced directives List of other physicians Hospitalizations, surgeries, and ER visits in previous 12 months Vitals Screenings to include cognitive, depression, and falls Referrals and appointments  In addition, I have reviewed and discussed with patient certain preventive protocols, quality metrics, and best practice recommendations. A written personalized care plan for preventive services as well as general preventive health recommendations were provided to patient.     Jordan Hawks Mariel Gaudin, CMA   10/18/2022   After Visit Summary: (MyChart) Due to this being a telephonic visit, the after visit summary with patients personalized plan was  offered to patient via MyChart   Nurse Notes: Patient declined referral to [facility] due to PHQ-9 score of 10. She stated she just doesn't have time to do it right now.

## 2022-10-19 ENCOUNTER — Ambulatory Visit (HOSPITAL_COMMUNITY): Payer: Medicare Other | Admitting: Anesthesiology

## 2022-10-19 ENCOUNTER — Encounter (HOSPITAL_COMMUNITY): Payer: Self-pay | Admitting: Gastroenterology

## 2022-10-19 ENCOUNTER — Telehealth: Payer: Self-pay

## 2022-10-19 ENCOUNTER — Ambulatory Visit (INDEPENDENT_AMBULATORY_CARE_PROVIDER_SITE_OTHER): Payer: Medicare Other | Admitting: Physician Assistant

## 2022-10-19 ENCOUNTER — Ambulatory Visit (HOSPITAL_COMMUNITY)
Admission: RE | Admit: 2022-10-19 | Discharge: 2022-10-19 | Disposition: A | Discharge status: Home / Self Care | Payer: Medicare Other | Attending: Gastroenterology | Admitting: Gastroenterology

## 2022-10-19 ENCOUNTER — Other Ambulatory Visit: Payer: Self-pay

## 2022-10-19 ENCOUNTER — Other Ambulatory Visit (INDEPENDENT_AMBULATORY_CARE_PROVIDER_SITE_OTHER): Payer: Medicare Other

## 2022-10-19 ENCOUNTER — Encounter (HOSPITAL_COMMUNITY)
Admission: RE | Disposition: A | Discharge status: Home / Self Care | Payer: Self-pay | Source: Home / Self Care | Attending: Gastroenterology

## 2022-10-19 DIAGNOSIS — M25531 Pain in right wrist: Secondary | ICD-10-CM

## 2022-10-19 DIAGNOSIS — M109 Gout, unspecified: Secondary | ICD-10-CM | POA: Insufficient documentation

## 2022-10-19 DIAGNOSIS — Z13810 Encounter for screening for upper gastrointestinal disorder: Secondary | ICD-10-CM | POA: Diagnosis not present

## 2022-10-19 DIAGNOSIS — I8500 Esophageal varices without bleeding: Secondary | ICD-10-CM | POA: Diagnosis not present

## 2022-10-19 DIAGNOSIS — Z853 Personal history of malignant neoplasm of breast: Secondary | ICD-10-CM | POA: Insufficient documentation

## 2022-10-19 DIAGNOSIS — K7460 Unspecified cirrhosis of liver: Secondary | ICD-10-CM | POA: Diagnosis not present

## 2022-10-19 DIAGNOSIS — D132 Benign neoplasm of duodenum: Secondary | ICD-10-CM | POA: Diagnosis not present

## 2022-10-19 DIAGNOSIS — M1990 Unspecified osteoarthritis, unspecified site: Secondary | ICD-10-CM | POA: Insufficient documentation

## 2022-10-19 DIAGNOSIS — E1140 Type 2 diabetes mellitus with diabetic neuropathy, unspecified: Secondary | ICD-10-CM | POA: Diagnosis not present

## 2022-10-19 DIAGNOSIS — Z794 Long term (current) use of insulin: Secondary | ICD-10-CM | POA: Insufficient documentation

## 2022-10-19 DIAGNOSIS — I10 Essential (primary) hypertension: Secondary | ICD-10-CM | POA: Insufficient documentation

## 2022-10-19 DIAGNOSIS — I8510 Secondary esophageal varices without bleeding: Secondary | ICD-10-CM | POA: Diagnosis not present

## 2022-10-19 DIAGNOSIS — G4733 Obstructive sleep apnea (adult) (pediatric): Secondary | ICD-10-CM | POA: Insufficient documentation

## 2022-10-19 DIAGNOSIS — K31A0 Gastric intestinal metaplasia, unspecified: Secondary | ICD-10-CM | POA: Diagnosis not present

## 2022-10-19 DIAGNOSIS — K639 Disease of intestine, unspecified: Secondary | ICD-10-CM | POA: Diagnosis not present

## 2022-10-19 DIAGNOSIS — Z923 Personal history of irradiation: Secondary | ICD-10-CM | POA: Insufficient documentation

## 2022-10-19 DIAGNOSIS — K3189 Other diseases of stomach and duodenum: Secondary | ICD-10-CM | POA: Insufficient documentation

## 2022-10-19 DIAGNOSIS — Z7984 Long term (current) use of oral hypoglycemic drugs: Secondary | ICD-10-CM | POA: Insufficient documentation

## 2022-10-19 DIAGNOSIS — E039 Hypothyroidism, unspecified: Secondary | ICD-10-CM | POA: Diagnosis not present

## 2022-10-19 HISTORY — PX: ESOPHAGOGASTRODUODENOSCOPY (EGD) WITH PROPOFOL: SHX5813

## 2022-10-19 HISTORY — PX: BIOPSY: SHX5522

## 2022-10-19 LAB — GLUCOSE, CAPILLARY: Glucose-Capillary: 165 mg/dL — ABNORMAL HIGH (ref 70–99)

## 2022-10-19 SURGERY — ESOPHAGOGASTRODUODENOSCOPY (EGD) WITH PROPOFOL
Anesthesia: General

## 2022-10-19 MED ORDER — PROPOFOL 10 MG/ML IV BOLUS
INTRAVENOUS | Status: DC | PRN
  Administered 2022-10-19 (×2): 50 mg via INTRAVENOUS

## 2022-10-19 MED ORDER — LIDOCAINE HCL (CARDIAC) PF 100 MG/5ML IV SOSY
PREFILLED_SYRINGE | INTRAVENOUS | Status: DC | PRN
  Administered 2022-10-19: 50 mg via INTRAVENOUS

## 2022-10-19 MED ORDER — TRAMADOL HCL 50 MG PO TABS
50.0000 mg | ORAL_TABLET | Freq: Two times a day (BID) | ORAL | 1 refills | Status: DC | PRN
Start: 2022-10-19 — End: 2023-04-18

## 2022-10-19 MED ORDER — LACTATED RINGERS IV SOLN: INTRAVENOUS | Status: DC | PRN

## 2022-10-19 MED ORDER — CARVEDILOL 3.125 MG PO TABS: 3.1250 mg | ORAL_TABLET | Freq: Two times a day (BID) | ORAL | 0 refills | Status: DC

## 2022-10-19 NOTE — H&P (Signed)
[name] [name] is an 72 y.o. female.   Chief Complaint: Cirrhosis, rule out esophageal varices HPI: [name] [name] is a 72 y.o. female with past medical history of NASH cirrhosis, breast cancer status post right lumpectomy and chemotherapy, hypothyroidism, OSA, diabetes placated by neuropathy, gout, hypertension, who presents for evaluation of possible esophageal varices.  The patient denies having any nausea, vomiting, fever, chills, hematochezia, melena, hematemesis, abdominal distention, abdominal pain, diarrhea, jaundice, pruritus or weight loss.  Past Medical History:  Diagnosis Date   Abdominal wall contusion 12/09/2011   Achilles tendon contracture, bilateral 06/03/2016   Anxiety    Arthritis    Blood dyscrasia    low platelet count- sees [name] , Dr. Nelva Nay ( Note in EPIC from 07/2014) at [facility]   Breast cancer [facility]) 2017   Left Breast   Breast cancer (HCC) 07/2021   right breast IDC   Cancer (HCC)    left breast   Chest wall contusion 12/09/2011   Cirrhosis of liver (HCC)    Cough    Diabetes mellitus without complication (HCC)    Diabetic neuropathy (HCC)    Diarrhea due to drug 01/29/2021   Dyspnea    Gout    Gout    History of radiation therapy 10/22/15 - 12/08/15   left breast 50.4 Gy, boost to 10 Gy   History of radiation therapy    Right breast- 09/22/21-10/30/21- Dr. 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   Neuromuscular disorder (HCC)    diabetic neuropathy   Neuropathy    Personal history of radiation therapy 2017   Left Breast Cancer   Posterior tibial tendon dysfunction (PTTD) of right lower extremity 10/09/2019   Sleep apnea    CPAP nightly   Spleen enlarged    Thyroid disease     Past Surgical History:  Procedure Laterality Date   ANKLE FUSION Right 2022   BREAST BIOPSY Left 08/04/2015   Procedure: LEFT BREAST BIOPSY WITH NEEDLE LOCALIZATION;  Surgeon: Darnell Level, MD;  Location: [facility];  Service: General;  Laterality: Left;   BREAST BIOPSY Right 06/23/2021   BREAST DUCTAL SYSTEM EXCISION Left 08/04/2015   Procedure: LEFT EXCISION DUCTAL SYSTEM BREAST;  Surgeon: Darnell Level, MD;  Location: [facility];  Service: General;  Laterality: Left;   BREAST LUMPECTOMY Left 08/2015   BREAST LUMPECTOMY Right 08/2021   BREAST LUMPECTOMY WITH AXILLARY LYMPH NODE BIOPSY Left 09/12/2015   Procedure: RE-EXCISION OF LEFT BREAST LUMPECTOMY WITH LEFT AXILLARY LYMPH NODE BIOPSY;  Surgeon: Darnell Level, MD;  Location: [facility];  Service: General;  Laterality: Left;   BREAST LUMPECTOMY WITH RADIOACTIVE SEED LOCALIZATION Right 08/12/2021   Procedure: RIGHT BREAST LUMPECTOMY WITH RADIOACTIVE SEED LOCALIZATION;  Surgeon: Abigail Miyamoto, MD;  Location: [facility];  Service: General;  Laterality: Right;  LMA   CHOLECYSTECTOMY     EYE SURGERY Bilateral 2022   Lashes growing backwards into the eye   JOINT REPLACEMENT     left knee   JOINT REPLACEMENT  2017   right knee   REVERSE SHOULDER ARTHROPLASTY Right 09/07/2018   Procedure: RIGHT REVERSE SHOULDER ARTHROPLASTY;  Surgeon: Cammy Copa, MD;  Location: [facility];  Service: Orthopedics;  Laterality: Right;   TOTAL KNEE ARTHROPLASTY Right 02/28/2015   Procedure: RIGHT TOTAL KNEE ARTHROPLASTY;  Surgeon: Kathryne Hitch, MD;  Location: [facility];  Service: Orthopedics;  Laterality: Right;   TUBAL LIGATION      Family History  Problem Relation Age of Onset   Hypothyroidism Mother    Alzheimer's disease Mother    Diabetes Father    Cancer Sister        GYN cancer   Hypothyroidism Sister    Heart disease Brother    Arthritis Son    Diabetes Son    Arthritis Son    Cancer Maternal Grandmother    Heart failure Maternal Grandfather    Bone cancer Paternal Grandmother    Heart failure Paternal Grandmother    Social History:  reports that she  has never smoked. She has never used smokeless tobacco. She reports that she does not drink alcohol and does not use drugs.  Allergies:  Allergies  Allergen Reactions   Codeine Swelling   Influenza A (H1n1) Monoval Pf Anaphylaxis   Hydrocodone-Acetaminophen Other (See Comments)   Lisinopril Other (See Comments)   Metformin Other (See Comments)   Pregabalin Other (See Comments)   Jardiance [Empagliflozin] Other (See Comments)    dehydration    Medications Prior to Admission  Medication Sig Dispense Refill   albuterol (VENTOLIN HFA) 108 (90 Base) MCG/ACT inhaler Inhale 1-2 puffs into the lungs every 4 (four) hours as needed for wheezing or shortness of breath. 18 g 2   buPROPion (WELLBUTRIN XL) 150 MG 24 hr tablet TAKE 1 TABLET BY MOUTH EVERY DAY IN THE MORNING 90 tablet 1   fluticasone (FLONASE) 50 MCG/ACT nasal spray Place 1 spray into both nostrils as needed.      gabapentin (NEURONTIN) 600 MG tablet TAKE 1 TABLET IN THE MORNING AND 1 TABLET IN THE AFTERNOON AND 2TABS AT BEDTIME     levothyroxine (SYNTHROID) 150 MCG tablet Take 1 tablet (150 mcg total) by mouth daily. 90 tablet 3   mirabegron ER (MYRBETRIQ) 25 MG TB24 tablet Take 1 tablet (25 mg total) by mouth daily. 30 tablet 11   mycophenolate (CELLCEPT) 500 MG tablet Take 1,500 mg by mouth 2 (two) times daily.     ACCU-CHEK AVIVA PLUS test strip USE AS INSTRUCTED TO CHECK BLOOD SUGAR 2 TIMES DAILY 100 strip 5   acetaminophen (TYLENOL) 500 MG tablet Take by mouth as needed.     Ascorbic Acid (VITAMIN C PO) Take by mouth daily.     Cyanocobalamin (VITAMIN B-12 PO) Take by mouth daily.     exemestane (AROMASIN) 25 MG tablet Take 1 tablet (25 mg total) by mouth daily after breakfast. 90 tablet 1   FARXIGA 10 MG TABS tablet TAKE 1 TABLET BY MOUTH DAILY BEFORE BREAKFAST. 90 tablet 3   guaiFENesin-codeine (CHERATUSSIN AC) 100-10 MG/5ML syrup Take 5 mLs by mouth 3 (three) times daily as needed for cough. 120 mL 0   insulin glargine  (LANTUS SOLOSTAR) 100 UNIT/ML Solostar Pen Inject 62 Units into the skin daily. 30 mL 0   Insulin Pen Needle (B-D UF III MINI PEN NEEDLES) 31G X 5 MM MISC USE AS DIRECTED WITH INSULIN EVERY MORNING, NOON, EVENING AND AT BEDTIME 400 each 3   Insulin Pen Needle 30G X 5 MM MISC 1 Device by Does not apply route in the morning, at noon, in the evening, and at bedtime. 400 each 3   ketoconazole (NIZORAL) 2 % cream Apply 1 Application topically daily. 60 g 0   losartan (COZAAR) 25 MG tablet Take 1 tablet (25 mg total) by mouth daily. 90 tablet 1   methocarbamol (ROBAXIN) 500 MG tablet  Take 1 tablet (500 mg total) by mouth every 8 (eight) hours as needed for muscle spasms. 30 tablet 0   Multiple Vitamins-Minerals (MULTIVITAMIN WITH MINERALS) tablet Take 1 tablet by mouth daily.     promethazine-dextromethorphan (PROMETHAZINE-DM) 6.25-15 MG/5ML syrup Take 5 mLs by mouth 4 (four) times daily as needed for cough. 180 mL 0   Pyridoxine HCl (VITAMIN B-6 PO) Take by mouth daily.     Semaglutide, 1 MG/DOSE, (OZEMPIC, 1 MG/DOSE,) 4 MG/3ML SOPN Inject 1 mg into the skin once a week. 3 mL 5   UNABLE TO FIND 1 each by Does not apply route daily. Med Name: POWER WHEELCHAIR  DX CODE-R26.89 1 each 0   UNABLE TO FIND 1 each by Does not apply route daily. Med Name: POWER SCOOTER DX CODE-R26.89 1 each 0   VITAMIN D PO Take by mouth daily.      Results for orders placed or performed during the hospital encounter of 10/19/22 (from the past 48 hour(s))  Glucose, capillary     Status: Abnormal   Collection Time: 10/19/22  8:10 AM  Result Value Ref Range   Glucose-Capillary 165 (H) 70 - 99 mg/dL    Comment: Glucose reference range applies only to samples taken after fasting for at least 8 hours.   No results found.  Review of Systems  All other systems reviewed and are negative.   Blood pressure (!) 143/58, pulse 75, temperature 98.7 F (37.1 C), temperature source Oral, resp. rate 20, height 5\' 1"  (1.549 m),  weight 112 kg, SpO2 100%. Physical Exam  GENERAL: The patient is AO x3, in no acute distress. HEENT: Head is normocephalic and atraumatic. EOMI are intact. Mouth is well hydrated and without lesions. NECK: Supple. No masses LUNGS: Clear to auscultation. No presence of rhonchi/wheezing/rales. Adequate chest expansion HEART: RRR, normal s1 and s2. ABDOMEN: Soft, nontender, no guarding, no peritoneal signs, and nondistended. BS +. No masses. EXTREMITIES: Without any cyanosis, clubbing, rash, lesions or edema. NEUROLOGIC: AOx3, no focal motor deficit. SKIN: no jaundice, no rashes  Assessment/Plan [name] [name] is a 72 y.o. female with past medical history of NASH cirrhosis, breast cancer status post right lumpectomy and chemotherapy, hypothyroidism, OSA, diabetes placated by neuropathy, gout, hypertension, who presents for evaluation of possible esophageal varices.  Will proceed with EGD.  Dolores Frame, MD 10/19/2022, 9:31 AM

## 2022-10-19 NOTE — Op Note (Signed)
[facility] Patient Name: [name] [name] Procedure Date: 10/19/2022 10:04 AM MRN: [id number] Date of Birth: 09/30/1950 Attending MD: Katrinka Blazing , , [id number] CSN: [id number] Age: 72 Admit Type: Outpatient Procedure:                Upper GI endoscopy Indications:              Cirrhosis rule out esophageal varices Providers:                Katrinka Blazing, Nena Polio, RN, Zena Amos Referring MD:              Medicines:                Monitored Anesthesia Care Complications:            No immediate complications. Estimated Blood Loss:     Estimated blood loss: none. Procedure:                Pre-Anesthesia Assessment:                           - Prior to the procedure, a History and Physical                            was performed, and patient medications, allergies                            and sensitivities were reviewed. The patient's                            tolerance of previous anesthesia was reviewed.                           - The risks and benefits of the procedure and the                            sedation options and risks were discussed with the                            patient. All questions were answered and informed                            consent was obtained.                           - ASA Grade Assessment: III - A patient with severe                            systemic disease.                           After obtaining informed consent, the endoscope was                            passed under direct vision. Throughout the                            procedure, the  patient's blood pressure, pulse, and                            oxygen saturations were monitored continuously. The                            GIF-H190 ([id number]) scope was introduced through the                            mouth, and advanced to the second part of duodenum.                            The upper GI endoscopy was accomplished without                             difficulty. The patient tolerated the procedure                            well. Scope In: 10:19:37 AM Scope Out: 10:26:33 AM Total Procedure Duration: 0 hours 6 minutes 56 seconds  Findings:      Grade II varices were found in the middle third of the esophagus and in       the lower third of the esophagus.      The entire examined stomach was normal.      A single 10 mm mucosal nodule was found in the second portion of the       duodenum. The nodule was Paris classification Is (protruding, sessile).       Biopsies were taken with a pediatric cold forceps for histology. Impression:               - Grade II esophageal varices.                           - Normal stomach.                           - Mucosal nodule found in the duodenum. Biopsied. Moderate Sedation:      Per Anesthesia Care Recommendation:           - Discharge patient to home (ambulatory).                           - Resume previous diet.                           - Await pathology results.                           - Start carvedilol 3.125 mg BID. Procedure Code(s):        --- Professional ---                           [phone], Esophagogastroduodenoscopy, flexible,                            transoral; with biopsy, single or multiple Diagnosis Code(s):        ---  Professional ---                           K74.60, Unspecified cirrhosis of liver                           I85.10, Secondary esophageal varices without                            bleeding                           K31.89, Other diseases of stomach and duodenum CPT copyright 2022 American Medical Association. All rights reserved. The codes documented in this report are preliminary and upon coder review Leather  be revised to meet current compliance requirements. Katrinka Blazing, MD Katrinka Blazing,  10/19/2022 10:37:56 AM This report has been signed electronically. Number of Addenda: 0

## 2022-10-19 NOTE — Anesthesia Preprocedure Evaluation (Signed)
Anesthesia Evaluation  Patient identified by MRN, date of birth, ID band Patient awake    Reviewed: Allergy & Precautions, H&P , NPO status , Patient's Chart, lab work & pertinent test results, reviewed documented beta blocker date and time   Airway Mallampati: II  TM Distance: >3 FB Neck ROM: full    Dental no notable dental hx.    Pulmonary neg pulmonary ROS, shortness of breath, sleep apnea    Pulmonary exam normal breath sounds clear to auscultation       Cardiovascular Exercise Tolerance: Good hypertension, negative cardio ROS  Rhythm:regular Rate:Normal     Neuro/Psych  Headaches PSYCHIATRIC DISORDERS Anxiety Depression     Neuromuscular disease negative neurological ROS  negative psych ROS   GI/Hepatic negative GI ROS, Neg liver ROS,,,(+) Hepatitis -  Endo/Other  negative endocrine ROSdiabetesHypothyroidism    Renal/GU negative Renal ROS  negative genitourinary   Musculoskeletal   Abdominal   Peds  Hematology negative hematology ROS (+) Blood dyscrasia   Anesthesia Other Findings   Reproductive/Obstetrics negative OB ROS                             Anesthesia Physical Anesthesia Plan  ASA: 3  Anesthesia Plan: General   Post-op Pain Management:    Induction:   PONV Risk Score and Plan: Propofol infusion  Airway Management Planned:   Additional Equipment:   Intra-op Plan:   Post-operative Plan:   Informed Consent: I have reviewed the patients History and Physical, chart, labs and discussed the procedure including the risks, benefits and alternatives for the proposed anesthesia with the patient or authorized representative who has indicated his/her understanding and acceptance.     Dental Advisory Given  Plan Discussed with: CRNA  Anesthesia Plan Comments:        Anesthesia Quick Evaluation

## 2022-10-19 NOTE — Telephone Encounter (Signed)
Patient called with no answer. Detailed message left. Will send mychart message.

## 2022-10-19 NOTE — Transfer of Care (Signed)
Immediate Anesthesia Transfer of Care Note  Patient: [name] [name]  Procedure(s) Performed: ESOPHAGOGASTRODUODENOSCOPY (EGD) WITH PROPOFOL BIOPSY  Patient Location: Short Stay  Anesthesia Type:General  Level of Consciousness: awake, alert , oriented, and patient cooperative  Airway & Oxygen Therapy: Patient Spontanous Breathing  Post-op Assessment: Report given to RN and Patient moving all extremities X 4  Post vital signs: Reviewed and stable  Last Vitals:  Vitals Value Taken Time  BP 110/46 10/19/22 1032  Temp 36.7 C 10/19/22 1032  Pulse 75 10/19/22 1032  Resp 19 10/19/22 1032  SpO2 94 % 10/19/22 1032    Last Pain:  Vitals:   10/19/22 1032  TempSrc: Axillary  PainSc: 0-No pain      Patients Stated Pain Goal: 6 (10/19/22 0831)  Complications: No notable events documented.

## 2022-10-19 NOTE — Discharge Instructions (Signed)
You are being discharged to home.  Resume your previous diet.  We are waiting for your pathology results.  Start carvedilol 3.125 mg BID.

## 2022-10-19 NOTE — Telephone Encounter (Signed)
-----   Message from [name] [name] sent at 10/19/2022 10:10 AM EDT ----- At her last visit on 08/24/22 this patient was scheduled for 8 week f/u with me tomorrow (Wednesday 9/18) but after the visit Dr. Ronne Binning and advised cystoscopy as next step, therefore she does not need to come in to see me tomorrow. Please cancel tomorrow's appointment & notify pt. She is advised to follow up for the cystoscopy on 11/17/22 as scheduled with Dr. Ronne Binning. Thanks.

## 2022-10-19 NOTE — Progress Notes (Signed)
Office Visit Note   Patient: [name] [name]           Date of Birth: 1950-06-27           MRN: [id number] Visit Date: 10/19/2022              Requested by: Anabel Halon, MD [address] [place],  [place] [address] PCP: Anabel Halon, MD   Assessment & Plan: Visit Diagnoses:  1. Pain in right wrist     Plan: Impression is right wrist pain likely from exacerbation of underlying radiocarpal arthritis.  We have discussed treating this symptomatically.  I have recommended topical Voltaren and is sent in a prescription of tramadol.  We have also discussed cortisone injection if this fails to relieve her symptoms.  She will let us know.  Follow-up as needed.  Follow-Up Instructions: Return if symptoms worsen or fail to improve.   Orders:  Orders Placed This Encounter  Procedures   XR Wrist Complete Right   Meds ordered this encounter  Medications   traMADol (ULTRAM) 50 MG tablet    Sig: Take 1 tablet (50 mg total) by mouth every 12 (twelve) hours as needed.    Dispense:  30 tablet    Refill:  1      Procedures: No procedures performed   Clinical Data: No additional findings.   Subjective: Chief Complaint  Patient presents with   Right Wrist - Pain    HPI patient is a very pleasant 72 year old right-hand-dominant female who comes in today following an injury to her right wrist.  This past Friday, 10/15/2022, she rolled out of her bed landing on her right side.  She has had pain to the distal radius since.  Symptoms are worse when moving her wrist.  She has been taking Tylenol without significant relief.  Review of Systems as detailed in HPI.  All others reviewed and are negative.   Objective: Vital Signs: There were no vitals taken for this visit.  Physical Exam well-developed well-nourished female no acute distress.  Alert and oriented x 3.  Ortho Exam right wrist exam shows moderate tenderness to the distal radius.  She has diffuse and milder tenderness  throughout the rest of the wrist.  Increased pain with supination, wrist flexion and extension.  No ecchymosis.  She is neurovascularly intact distally.  Specialty Comments:  No specialty comments available.  Imaging: XR Wrist Complete Right  Result Date: 10/19/2022 X-rays demonstrate advanced radiocarpal degenerative changes as well as degenerative changes throughout the entire wrist to include the first [facility] joint.  No acute fracture noted.    PMFS History: Patient Active Problem List   Diagnosis Date Noted   Secondary esophageal varices without bleeding (HCC) 10/19/2022   COVID-19 virus infection 09/17/2022   Immunosuppressed status (HCC) 09/17/2022   Abnormal CT of the abdomen 08/09/2022   Paraumbilical hernia 07/29/2022   Esophageal varices in cirrhosis (HCC) 07/29/2022   Cirrhosis of liver without ascites (HCC) 07/12/2022   LUQ abdominal pain 07/05/2022   Drug-induced constipation 07/05/2022   Anxiety disorder 08/28/2021   Disorder of intervertebral disc of cervical spine 08/28/2021   Family history of malignant neoplasm of digestive organs 08/28/2021   Malignant neoplasm of right breast (HCC) 07/08/2021   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   Pseudophakia of both eyes 04/01/2021   Retinal edema 04/01/2021   Trichiasis of eyelid of both eyes 04/01/2021   Hypertrophy of inferior nasal turbinate 02/26/2021   Allergic rhinitis due to  pollen 01/29/2021   Allergy to influenza vaccine 01/29/2021   Gout 01/29/2021   Hyperlipidemia 01/29/2021   Nonalcoholic steatohepatitis (NASH) 01/29/2021   Primary insomnia 01/29/2021   Recurrent major depression in remission (HCC) 01/29/2021   Arthritis of right ankle 04/14/2020   Nasal septal deviation 03/11/2020   Arthritis of right subtalar joint 10/09/2019   Primary osteoarthritis of right ankle 10/09/2019   Acquired pes planovalgus of right foot 10/09/2019   Acquired posterior equinus of right lower extremity 10/09/2019    Epistaxis, recurrent 09/26/2019   OSA on CPAP 09/26/2019   Acquired hypothyroidism 05/21/2019   Arthritis of shoulder 09/07/2018   Bilateral hearing loss 08/01/2018   Vertigo 08/01/2018   Chronic nonintractable headache 08/01/2018   Type 2 diabetes mellitus with diabetic polyneuropathy, with long-term current use of insulin (HCC) 11/16/2016   Bilateral lower extremity edema 09/01/2016   Diabetic polyneuropathy associated with type 2 diabetes mellitus (HCC) 06/03/2016   Malignant neoplasm of upper-outer quadrant of left breast in female, estrogen receptor positive (HCC) 08/19/2015   Osteoarthritis of right knee 02/28/2015   Status post total right knee replacement 02/28/2015   Splenomegaly 07/30/2014   Thrombocytopenia (HCC) 07/30/2014   Recurrent falls 01/12/2013   Morbid obesity (HCC) 01/12/2013   Degenerative arthritis of spine 01/12/2013   Essential hypertension 09/05/2012   Past Medical History:  Diagnosis Date   Abdominal wall contusion 12/09/2011   Achilles tendon contracture, bilateral 06/03/2016   Anxiety    Arthritis    Blood dyscrasia    low platelet count- sees [name] , Dr. Nelva Nay ( Note in EPIC from 07/2014) at [facility]   Breast cancer [facility]) 2017   Left Breast   Breast cancer (HCC) 07/2021   right breast IDC   Cancer (HCC)    left breast   Chest wall contusion 12/09/2011   Cirrhosis of liver (HCC)    Cough    Diabetes mellitus without complication (HCC)    Diabetic neuropathy (HCC)    Diarrhea due to drug 01/29/2021   Dyspnea    Gout    Gout    History of radiation therapy 10/22/15 - 12/08/15   left breast 50.4 Gy, boost to 10 Gy   History of radiation therapy    Right breast- 09/22/21-10/30/21- Dr. 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   Neuromuscular disorder (HCC)    diabetic neuropathy   Neuropathy    Personal history of radiation therapy 2017   Left Breast  Cancer   Posterior tibial tendon dysfunction (PTTD) of right lower extremity 10/09/2019   Sleep apnea    CPAP nightly   Spleen enlarged    Thyroid disease     Family History  Problem Relation Age of Onset   Hypothyroidism Mother    Alzheimer's disease Mother    Diabetes Father    Cancer Sister        GYN cancer   Hypothyroidism Sister    Heart disease Brother    Arthritis Son    Diabetes Son    Arthritis Son    Cancer Maternal Grandmother    Heart failure Maternal Grandfather    Bone cancer Paternal Grandmother    Heart failure Paternal Grandmother     Past Surgical History:  Procedure Laterality Date   ANKLE FUSION Right 2022   BREAST BIOPSY Left 08/04/2015   Procedure: LEFT BREAST BIOPSY WITH NEEDLE LOCALIZATION;  Surgeon: Darnell Level, MD;  Location: [facility];  Service: General;  Laterality: Left;   BREAST BIOPSY Right 06/23/2021   BREAST DUCTAL SYSTEM EXCISION Left 08/04/2015   Procedure: LEFT EXCISION DUCTAL SYSTEM BREAST;  Surgeon: Darnell Level, MD;  Location: [facility];  Service: General;  Laterality: Left;   BREAST LUMPECTOMY Left 08/2015   BREAST LUMPECTOMY Right 08/2021   BREAST LUMPECTOMY WITH AXILLARY LYMPH NODE BIOPSY Left 09/12/2015   Procedure: RE-EXCISION OF LEFT BREAST LUMPECTOMY WITH LEFT AXILLARY LYMPH NODE BIOPSY;  Surgeon: Darnell Level, MD;  Location: [facility];  Service: General;  Laterality: Left;   BREAST LUMPECTOMY WITH RADIOACTIVE SEED LOCALIZATION Right 08/12/2021   Procedure: RIGHT BREAST LUMPECTOMY WITH RADIOACTIVE SEED LOCALIZATION;  Surgeon: Abigail Miyamoto, MD;  Location:  SURGERY CENTER;  Service: General;  Laterality: Right;  LMA   CHOLECYSTECTOMY     EYE SURGERY Bilateral 2022   Lashes growing backwards into the eye   JOINT REPLACEMENT     left knee   JOINT REPLACEMENT  2017   right knee   REVERSE SHOULDER ARTHROPLASTY Right 09/07/2018   Procedure: RIGHT REVERSE SHOULDER  ARTHROPLASTY;  Surgeon: Cammy Copa, MD;  Location: [facility];  Service: Orthopedics;  Laterality: Right;   TOTAL KNEE ARTHROPLASTY Right 02/28/2015   Procedure: RIGHT TOTAL KNEE ARTHROPLASTY;  Surgeon: Kathryne Hitch, MD;  Location: [facility];  Service: Orthopedics;  Laterality: Right;   TUBAL LIGATION     Social History   Occupational History   Occupation: Retired    Comment: Dance movement psychotherapist  Tobacco Use   Smoking status: Never   Smokeless tobacco: Never  Vaping Use   Vaping status: Never Used  Substance and Sexual Activity   Alcohol use: No   Drug use: No   Sexual activity: Yes    Birth control/protection: Surgical    Comment: BTL

## 2022-10-20 ENCOUNTER — Other Ambulatory Visit (INDEPENDENT_AMBULATORY_CARE_PROVIDER_SITE_OTHER): Payer: Self-pay | Admitting: Gastroenterology

## 2022-10-20 ENCOUNTER — Ambulatory Visit: Payer: Medicare Other | Admitting: Urology

## 2022-10-20 DIAGNOSIS — K2980 Duodenitis without bleeding: Secondary | ICD-10-CM

## 2022-10-20 MED ORDER — OMEPRAZOLE 20 MG PO CPDR
20.0000 mg | DELAYED_RELEASE_CAPSULE | Freq: Every day | ORAL | 3 refills | Status: DC
Start: 2022-10-20 — End: 2023-09-08

## 2022-10-21 ENCOUNTER — Encounter (INDEPENDENT_AMBULATORY_CARE_PROVIDER_SITE_OTHER): Payer: Self-pay | Admitting: *Deleted

## 2022-10-21 NOTE — Progress Notes (Signed)
Care Coordination  Outreach Note  10/21/2022 Name: [name] [name] MRN: [id number] DOB: 1950-08-24   Care Coordination Outreach Attempts: A second unsuccessful outreach was attempted today to offer the patient with information about available care coordination services.  Follow Up Plan:  Additional outreach attempts will be made to offer the patient care coordination information and services.   Encounter Outcome:  No Answer  Gwenevere Ghazi  Care Coordination Care Guide  Direct Dial: [phone]

## 2022-10-25 ENCOUNTER — Telehealth: Payer: Self-pay | Admitting: Internal Medicine

## 2022-10-25 NOTE — Telephone Encounter (Signed)
Pt LVM Friday says she was returning a call

## 2022-10-25 NOTE — Telephone Encounter (Signed)
Did not try to reach patient on Friday , nor note that anyone in office was trying to contact her

## 2022-10-25 NOTE — Progress Notes (Unsigned)
Care Coordination  Outreach Note  10/25/2022 Name: [name] [name] MRN: [id number] DOB: February 15, 1950   Care Coordination Outreach Attempts: A third unsuccessful outreach was attempted today to offer the patient with information about available care coordination services.  Follow Up Plan:  Additional outreach attempts will be made to offer the patient care coordination information and services.   Encounter Outcome:  Patient Request to Call Back  [facility] Coordination Care Guide  Direct Dial: [phone]

## 2022-10-26 ENCOUNTER — Ambulatory Visit: Payer: Self-pay

## 2022-10-26 DIAGNOSIS — L121 Cicatricial pemphigoid: Secondary | ICD-10-CM | POA: Diagnosis not present

## 2022-10-26 DIAGNOSIS — Z961 Presence of intraocular lens: Secondary | ICD-10-CM | POA: Diagnosis not present

## 2022-10-26 DIAGNOSIS — H02056 Trichiasis without entropian left eye, unspecified eyelid: Secondary | ICD-10-CM | POA: Diagnosis not present

## 2022-10-26 DIAGNOSIS — H3581 Retinal edema: Secondary | ICD-10-CM | POA: Diagnosis not present

## 2022-10-26 DIAGNOSIS — H02053 Trichiasis without entropian right eye, unspecified eyelid: Secondary | ICD-10-CM | POA: Diagnosis not present

## 2022-10-26 DIAGNOSIS — E119 Type 2 diabetes mellitus without complications: Secondary | ICD-10-CM | POA: Diagnosis not present

## 2022-10-26 DIAGNOSIS — Z79899 Other long term (current) drug therapy: Secondary | ICD-10-CM | POA: Diagnosis not present

## 2022-10-26 NOTE — Progress Notes (Signed)
Care Coordination   Note   10/26/2022 Name: Yuvette Postal Fetterolf MRN: [id number] DOB: 04-28-1950  Maceo Pro Bugbee is a 72 y.o. year old female who sees Anabel Halon, MD for primary care. I reached out to [facility] A Grippi by phone today to offer care coordination services.  Ms. Lepera was given information about Care Coordination services today including:   The Care Coordination services include support from the care team which includes your Nurse Coordinator, Clinical Social Worker, or Pharmacist.  The Care Coordination team is here to help remove barriers to the health concerns and goals most important to you. Care Coordination services are voluntary, and the patient Ornstein decline or stop services at any time by request to their care team member.   Care Coordination Consent Status: Patient agreed to services and verbal consent obtained.   Follow up plan:  Telephone appointment with care coordination team member scheduled for:  11/01/22  Encounter Outcome:  Patient Scheduled  [facility] Coordination Care Guide  Direct Dial: [phone]

## 2022-10-26 NOTE — Patient Instructions (Signed)
Visit Information  Thank you for taking time to visit with me today. Please don't hesitate to contact me if I can be of assistance to you.   Following are the goals we discussed today:  Patient agreed to speak to family to assist with cleaning.   Our next appointment is by telephone on 11/01/22 at 10am.  Please call the care guide team at [phone] if you need to cancel or reschedule your appointment.   If you are experiencing a Mental Health or Behavioral Health Crisis or need someone to talk to, please call 911  Patient verbalizes understanding of instructions and care plan provided today and agrees to view in MyChart. Active MyChart status and patient understanding of how to access instructions and care plan via MyChart confirmed with patient.     Telephone follow up appointment with care management team member scheduled for: 11/01/22 at 10am.  Lysle Morales, BSW Social Worker  [phone]

## 2022-10-26 NOTE — Patient Outreach (Signed)
Care Coordination   Initial Visit Note   10/26/2022 Name: [name] [name] MRN: [id number] DOB: 12/16/1950  [name] [name] is a 72 y.o. year old female who sees Anabel Halon, MD for primary care. I spoke with  [name] [name] by phone today.  What matters to the patients health and wellness today?  Patient wants help with cleaning due to medical issues that both she and her husband have.     Goals Addressed             This Visit's Progress    Cleaning and personal care       Interventions Today    Flowsheet Row Most Recent Value  Chronic Disease   Chronic disease during today's visit Diabetes, Hypertension (HTN), Other  [Cancer]  General Interventions   General Interventions Discussed/Reviewed General Interventions Discussed, General Interventions Reviewed, Level of Care  [Pt request help with cleaning but can't afford to pay for services. SW suggest speaking to family for help.Cancer treatments completed. Insurance denied scooter.]              SDOH assessments and interventions completed:  Yes  SDOH Interventions Today    Flowsheet Row Most Recent Value  SDOH Interventions   Food Insecurity Interventions Intervention Not Indicated, Other (Comment)  [Has access to a garden and family help]  Housing Interventions Intervention Not Indicated  Transportation Interventions Intervention Not Indicated, Other (Comment)  [Husband drives]  Utilities Interventions Intervention Not Indicated, Other (Comment)  [On a payment plan for [employer]]        Care Coordination Interventions:  Yes, provided   Follow up plan: Follow up call scheduled for 11/01/22 at 10am    Encounter Outcome:  Patient Visit Completed

## 2022-10-28 ENCOUNTER — Ambulatory Visit: Payer: Self-pay | Admitting: *Deleted

## 2022-10-28 ENCOUNTER — Encounter (HOSPITAL_COMMUNITY): Payer: Self-pay | Admitting: Gastroenterology

## 2022-10-28 NOTE — Patient Outreach (Signed)
Care Coordination   Initial Visit Note   10/28/2022  Name: [name] [name] MRN: [id number] DOB: 11/23/1950  [name] [name] is a 72 y.o. year old female who sees Anabel Halon, MD for primary care. I spoke with [name] [name] A Turay by phone today.  What matters to the patients health and wellness today?  Receive Help in The Home.   Goals Addressed               This Visit's Progress     Receive Help in The Home. (pt-stated)   On track     Care Coordination Interventions:  Interventions Today    Flowsheet Row Most Recent Value  Chronic Disease   Chronic disease during today's visit Diabetes, Hypertension (HTN), Other  [Recurrent Falls, Recurrent Major Depression, Morbid Obesity, Anxiety Disorder, Bilateral Lower Extremity Edema,]  General Interventions   General Interventions Discussed/Reviewed General Interventions Discussed, Labs, Vaccines, Doctor Visits, Referral to Nurse, Health Screening, Annual Foot Exam, General Interventions Reviewed, Lipid Profile, Communication with, [employer], Level of Care, Horticulturist, commercial (DME), Annual Eye Exam  [Encouraged]  Labs Hgb A1c every 3 months, Kidney Function  [Encouraged]  Vaccines COVID-19, Flu, Pneumonia, RSV, Shingles, Tetanus/Pertussis/Diphtheria  [Encouraged]  Doctor Visits Discussed/Reviewed Doctor Visits Discussed, Specialist, Doctor Visits Reviewed, Annual Wellness Visits, PCP  [Encouraged]  Health Screening Bone Density, Colonoscopy, Mammogram  [Encouraged]  Durable Medical Equipment (DME) Dan Humphreys  [Encouraged]  PCP/Specialist Visits Compliance with follow-up visit  [Encouraged]  Communication with PCP/Specialists, RN, Pharmacists, Social Work  [Encouraged]  Level of Care Adult Daycare, Development worker, international aid, Air traffic controller, Assisted Living, Skilled Nursing Facility  [Encouraged]  Applications Medicaid, Personal Care Services, [place]-2  [Encouraged]  Exercise Interventions   Exercise Discussed/Reviewed Exercise Discussed,  Assistive device use and maintanence, Exercise Reviewed, Physical Activity, Weight Managment  [Encouraged]  Physical Activity Discussed/Reviewed Physical Activity Discussed, Home Exercise Program (HEP), PREP, Physical Activity Reviewed, Gym, Types of exercise  [Encouraged]  Weight Management Weight loss  [Encouraged]  Education Interventions   Education Provided Provided Therapist, sports, Provided Web-based Education, Provided Education  [Encouraged]  Provided Verbal Education On Nutrition, Mental Health/Coping with Illness, When to see the doctor, Foot Care, Eye Care, Labs, Blood Sugar Monitoring, Applications, Exercise, [employer], [employer], Medication  [Encouraged]  Labs Reviewed --  [N/A]  Applications Medicaid, Personal Care Services, [place]-2  [Encouraged]  Mental Health Interventions   Mental Health Discussed/Reviewed Mental Health Discussed, Anxiety, Depression, Grief and Loss, Mental Health Reviewed, Substance Abuse, Coping Strategies, Suicide, Crisis, Other  [Domestic Violence]  Nutrition Interventions   Nutrition Discussed/Reviewed Nutrition Discussed, Nutrition Reviewed, Carbohydrate meal planning, Decreasing sugar intake, Supplemental nutrition, Portion sizes, Decreasing salt, Fluid intake, Decreasing fats, Increasing proteins, Adding fruits and vegetables  [Encouraged]  Pharmacy Interventions   Pharmacy Dicussed/Reviewed Medications and their functions, Medication Adherence, Affording Medications, Pharmacy Topics Discussed, Pharmacy Topics Reviewed  [Encouraged]  Medication Adherence --  [N/A]  Safety Interventions   Safety Discussed/Reviewed Safety Discussed, Safety Reviewed, Fall Risk, Home Safety  [Encouraged]  Home Safety Assistive Devices, Need for home safety assessment, Refer for home visit, Refer for community resources  [Encouraged]  Advanced Directive Interventions   Advanced Directives Discussed/Reviewed Advanced Directives Discussed  [Encouraged]       Assessed Social Determinant of Health Barriers. Discussed Plans for Ongoing Care Management Follow Up. Provided Careers information officer Information for Care Management Team Members. Screened for Signs & Symptoms of Depression, Related to Chronic Disease State.  PHQ2 & PHQ9 Depression Screen Completed & Results Reviewed.  Suicidal Ideation & Homicidal Ideation Assessed - None Present.   Domestic Violence Assessed - None Present. Access to Weapons Assessed - None Present.   Active Listening & Reflection Utilized.  Verbalization of Feelings Encouraged.  Emotional Support Provided. Feelings of Caregiver Burnout & Fatigue Validated. Caregiver Stress Acknowledged. Caregiver Resources Reviewed. Caregiver Support Groups Mailed. Self-Enrollment in Caregiver Support Group of Interest Emphasized. Crisis Support Information, Agencies, Services, & Resources Discussed. Problem Solving Interventions Identified. Task-Centered Solutions Implemented.   Solution-Focused Strategies Developed. Acceptance & Commitment Therapy Introduced. Brief Cognitive Behavioral Therapy Initiated. Client-Centered Therapy Enacted. Reviewed Prescription Medications & Discussed Importance of Compliance. Encouraged Administration of Medications, Exactly as Prescribed. Quality of Sleep Assessed & Sleep Hygiene Techniques Promoted. Discussed Higher Level of Care Options (I.e. Assisted Living, Extended Care, Intermediate Level, Etc.) & Encouraged Consideration. Verified No In-Home Care Services, [employer], Warden/ranger, Etc., Covered Under Current Insurance Providers through [employer] Cross/Blue [employer].   Reviewed Materials engineer through [employer] & Blue [facility] & Encouraged Consideration of Applying for Medicaid, through [facility] of Social Services [phone]). Confirmed Neither Patient, Nor Husband Were Veterans, Making Them  Ineligible to Apply for Aid & Attendance Benefits, Through [employer]. Encouraged Review of The Following List of [employer], [employer], Resources, Occupational psychologist, Mailed on 10/28/2022: ~ In-Home Care & Respite Agencies ~ Home Health Care Agencies ~ Respite Care Agencies & Facilities ~ Personal Care Service Agency Providers Encouraged Self-Enrollment with Psychiatrist of Interest in [place], from List Provided, to Receive Psychotropic Medication Administration & Management, in An Effort to Reduce & Manage Symptoms of Anxiety, Depression, Grief, & Loss. Encouraged Self-Enrollment with Therapist of Interest in [facility], from List Provided, to Receive Psychotherapeutic Counseling & Supportive Services, in An Effort to Reduce & Manage Symptoms of Anxiety, Depression, Grief, & Loss.  Encouraged Self-Enrollment with Grief & Loss Support Group of Interest in [facility], from List Provided, to [employer], Education, Counseling, Resources, Etc., in An Effort to Reduce & Manage Symptoms of Anxiety, Depression, Grief, & Loss. Encouraged Daily Implementation of Deep Breathing Exercises, Relaxation Techniques, & Mindfulness Meditation Strategies. Encouraged Completion of Advance Directives (Living Will & Healthcare Power of Attorney Documents) & Submission to Dr. Trena Platt, Primary Care Provider with [facility] [phone]) to Scan Into Electronic Medical Record in Epic. Encouraged Engagement with Lysle Morales, BSW with [facility], During Follow-Up Outreach Call, Scheduled on 11/01/2022 at 10:00 AM. Encouraged Contact with CSW (# [phone]), if You Have Questions, Need Assistance, or If Additional Social Work Needs Are Identified Between Now & Our Next Follow-Up Outreach Call, Scheduled on 11/10/2022 at 1:30 PM. Encouraged Attendance at Follow-Up Appointment with Dr. Wilkie Aye, Urologist with [facility]  [facility] [place] [phone]), Scheduled on 11/17/2022 at 8:50 AM. Encouraged Attendance at Follow-Up Appointment with Dr. Trena Platt, Primary Care Provider with [facility] [phone]), Scheduled on 11/25/2022 at 1:00 PM.        SDOH assessments and interventions completed:  Yes.  SDOH Interventions Today    Flowsheet Row Most Recent Value  SDOH Interventions   Food Insecurity Interventions Intervention Not Indicated  Housing Interventions Intervention Not Indicated  Transportation Interventions Intervention Not Indicated, Patient Resources (Friends/Family)  Utilities Interventions Intervention Not Indicated  Alcohol Usage Interventions Intervention Not Indicated (Score <7)  Depression Interventions/Treatment  Referral to Psychiatry, Medication, Counseling  [Provided Resources]  Financial Strain Interventions Intervention Not  Indicated  Physical Activity Interventions Intervention Not Indicated  Stress Interventions Offered [employer], Provide Counseling, Other (Comment)  [Provided Counseling Resources]  Social Connections Interventions Intervention Not Indicated  Health Literacy Interventions Intervention Not Indicated     Care Coordination Interventions:  Yes, provided.   Follow up plan: Follow up call scheduled for 11/10/2022 at 1:30 pm.  Encounter Outcome:  Patient Visit Completed.   Danford Bad, BSW, MSW, Printmaker Social Work Case Set designer Health  [facility], Population Health Direct Dial: [phone]  Fax: [phone] Email: [email or web address]: [email or web address]

## 2022-10-28 NOTE — Patient Instructions (Signed)
Visit Information  Thank you for taking time to visit with me today. Please don't hesitate to contact me if I can be of assistance to you.   Following are the goals we discussed today:   Goals Addressed               This Visit's Progress     Receive Help in The Home. (pt-stated)   On track     Care Coordination Interventions:  Interventions Today    Flowsheet Row Most Recent Value  Chronic Disease   Chronic disease during today's visit Diabetes, Hypertension (HTN), Other  [Recurrent Falls, Recurrent Major Depression, Morbid Obesity, Anxiety Disorder, Bilateral Lower Extremity Edema,]  General Interventions   General Interventions Discussed/Reviewed General Interventions Discussed, Labs, Vaccines, Doctor Visits, Referral to Nurse, Health Screening, Annual Foot Exam, General Interventions Reviewed, Lipid Profile, Communication with, [employer], Level of Care, Horticulturist, commercial (DME), Annual Eye Exam  [Encouraged]  Labs Hgb A1c every 3 months, Kidney Function  [Encouraged]  Vaccines COVID-19, Flu, Pneumonia, RSV, Shingles, Tetanus/Pertussis/Diphtheria  [Encouraged]  Doctor Visits Discussed/Reviewed Doctor Visits Discussed, Specialist, Doctor Visits Reviewed, Annual Wellness Visits, PCP  [Encouraged]  Health Screening Bone Density, Colonoscopy, Mammogram  [Encouraged]  Durable Medical Equipment (DME) Dan Humphreys  [Encouraged]  PCP/Specialist Visits Compliance with follow-up visit  [Encouraged]  Communication with PCP/Specialists, RN, Pharmacists, Social Work  [Encouraged]  Level of Care Adult Daycare, Development worker, international aid, Air traffic controller, Assisted Living, Skilled Nursing Facility  [Encouraged]  Applications Medicaid, Personal Care Services, FL-2  [Encouraged]  Exercise Interventions   Exercise Discussed/Reviewed Exercise Discussed, Assistive device use and maintanence, Exercise Reviewed, Physical Activity, Weight Managment  [Encouraged]  Physical Activity Discussed/Reviewed  Physical Activity Discussed, Home Exercise Program (HEP), PREP, Physical Activity Reviewed, Gym, Types of exercise  [Encouraged]  Weight Management Weight loss  [Encouraged]  Education Interventions   Education Provided Provided Therapist, sports, Provided Web-based Education, Provided Education  [Encouraged]  Provided Verbal Education On Nutrition, Mental Health/Coping with Illness, When to see the doctor, Foot Care, Eye Care, Labs, Blood Sugar Monitoring, Applications, Exercise, [employer], [employer], Medication  [Encouraged]  Labs Reviewed --  [N/A]  Applications Medicaid, Personal Care Services, [place]-2  [Encouraged]  Mental Health Interventions   Mental Health Discussed/Reviewed Mental Health Discussed, Anxiety, Depression, Grief and Loss, Mental Health Reviewed, Substance Abuse, Coping Strategies, Suicide, Crisis, Other  [Domestic Violence]  Nutrition Interventions   Nutrition Discussed/Reviewed Nutrition Discussed, Nutrition Reviewed, Carbohydrate meal planning, Decreasing sugar intake, Supplemental nutrition, Portion sizes, Decreasing salt, Fluid intake, Decreasing fats, Increasing proteins, Adding fruits and vegetables  [Encouraged]  Pharmacy Interventions   Pharmacy Dicussed/Reviewed Medications and their functions, Medication Adherence, Affording Medications, Pharmacy Topics Discussed, Pharmacy Topics Reviewed  [Encouraged]  Medication Adherence --  [N/A]  Safety Interventions   Safety Discussed/Reviewed Safety Discussed, Safety Reviewed, Fall Risk, Home Safety  [Encouraged]  Home Safety Assistive Devices, Need for home safety assessment, Refer for home visit, Refer for community resources  [Encouraged]  Advanced Directive Interventions   Advanced Directives Discussed/Reviewed Advanced Directives Discussed  [Encouraged]      Assessed Social Determinant of Health Barriers. Discussed Plans for Ongoing Care Management Follow Up. Provided Careers information officer Information for Care  Management Team Members. Screened for Signs & Symptoms of Depression, Related to Chronic Disease State.  PHQ2 & PHQ9 Depression Screen Completed & Results Reviewed.  Suicidal Ideation & Homicidal Ideation Assessed - None Present.   Domestic Violence Assessed - None Present. Access to Weapons Assessed - None Present.   Active  Listening & Reflection Utilized.  Verbalization of Feelings Encouraged.  Emotional Support Provided. Feelings of Caregiver Burnout & Fatigue Validated. Caregiver Stress Acknowledged. Caregiver Resources Reviewed. Caregiver Support Groups Mailed. Self-Enrollment in Caregiver Support Group of Interest Emphasized. Crisis Support Information, Agencies, Services, & Resources Discussed. Problem Solving Interventions Identified. Task-Centered Solutions Implemented.   Solution-Focused Strategies Developed. Acceptance & Commitment Therapy Introduced. Brief Cognitive Behavioral Therapy Initiated. Client-Centered Therapy Enacted. Reviewed Prescription Medications & Discussed Importance of Compliance. Encouraged Administration of Medications, Exactly as Prescribed. Quality of Sleep Assessed & Sleep Hygiene Techniques Promoted. Discussed Higher Level of Care Options (I.e. Assisted Living, Extended Care, Intermediate Level, Etc.) & Encouraged Consideration. Verified No In-Home Care Services, [employer], Warden/ranger, Etc., Covered Under Current Insurance Providers through [employer] Cross/Blue [employer].   Reviewed Materials engineer through [employer] Blue [facility] & Encouraged Consideration of Applying for Medicaid, through [facility] Social Services [phone]). Confirmed Neither Patient, Nor Husband Were Veterans, Making Them Ineligible to Apply for Aid & Attendance Benefits, Through [employer]. Encouraged Review of The Following List of [employer], [employer],  Resources, Occupational psychologist, Mailed on 10/28/2022: ~ In-Home Care & Respite Agencies ~ Home Health Care Agencies ~ Respite Care Agencies & Facilities ~ Personal Care Service Agency Providers Encouraged Self-Enrollment with Psychiatrist of Interest in [place], from List Provided, to Receive Psychotropic Medication Administration & Management, in An Effort to Reduce & Manage Symptoms of Anxiety, Depression, Grief, & Loss. Encouraged Self-Enrollment with Therapist of Interest in [facility], from List Provided, to Receive Psychotherapeutic Counseling & Supportive Services, in An Effort to Reduce & Manage Symptoms of Anxiety, Depression, Grief, & Loss.  Encouraged Self-Enrollment with Grief & Loss Support Group of Interest in [facility], from List Provided, to [employer], Education, Counseling, Resources, Etc., in An Effort to Reduce & Manage Symptoms of Anxiety, Depression, Grief, & Loss. Encouraged Daily Implementation of Deep Breathing Exercises, Relaxation Techniques, & Mindfulness Meditation Strategies. Encouraged Completion of Advance Directives (Living Will & Healthcare Power of Attorney Documents) & Submission to Dr. Trena Platt, Primary Care Provider with [facility] [phone]) to Scan Into Electronic Medical Record in Epic. Encouraged Engagement with Lysle Morales, BSW with [facility], During Follow-Up Outreach Call, Scheduled on 11/01/2022 at 10:00 AM. Encouraged Contact with CSW (# [phone]), if You Have Questions, Need Assistance, or If Additional Social Work Needs Are Identified Between Now & Our Next Follow-Up Outreach Call, Scheduled on 11/10/2022 at 1:30 PM. Encouraged Attendance at Follow-Up Appointment with Dr. Wilkie Aye, Urologist with [facility] [phone]), Scheduled on 11/17/2022 at 8:50 AM. Encouraged Attendance at Follow-Up Appointment with Dr. Trena Platt, Primary Care  Provider with [facility] [phone]), Scheduled on 11/25/2022 at 1:00 PM.      Our next appointment is by telephone on 11/10/2022 at 1:30 pm.  Please call the care guide team at [phone] if you need to cancel or reschedule your appointment.   If you are experiencing a Mental Health or Behavioral Health Crisis or need someone to talk to, please call the Suicide and Crisis Lifeline: 988 call the [place] National Suicide Prevention Lifeline: 269-672-7046 or TTY: 954-577-3291 TTY 450-784-7864) to talk to a trained counselor call 1-800-273-TALK (toll free, 24 hour hotline) go to [facility] [address] [place] [phone]) call the [facility] Line: [phone] call 911  Patient verbalizes  understanding of instructions and care plan provided today and agrees to view in MyChart. Active MyChart status and patient understanding of how to access instructions and care plan via MyChart confirmed with patient.     Telephone follow up appointment with care management team member scheduled for:  11/10/2022 at 1:30 pm.  Danford Bad, BSW, MSW, LCSW  Embedded Practice Social Work Case Manager  [facility], Population Health Direct Dial: [phone]  Fax: [phone] Email: [email or web address]: [email or web address]

## 2022-10-28 NOTE — Anesthesia Postprocedure Evaluation (Signed)
Anesthesia Post Note  Patient: [name] [name]  Procedure(s) Performed: ESOPHAGOGASTRODUODENOSCOPY (EGD) WITH PROPOFOL BIOPSY  Patient location during evaluation: Phase II Anesthesia Type: General Level of consciousness: awake Pain management: pain level controlled Vital Signs Assessment: post-procedure vital signs reviewed and stable Respiratory status: spontaneous breathing and respiratory function stable Cardiovascular status: blood pressure returned to baseline and stable Postop Assessment: no headache and no apparent nausea or vomiting Anesthetic complications: no Comments: Late entry   No notable events documented.   Last Vitals:  Vitals:   10/19/22 0831 10/19/22 1032  BP: (!) 143/58 (!) 110/46  Pulse: 75 75  Resp: 20 19  Temp: 37.1 C 36.7 C  SpO2: 100% 94%    Last Pain:  Vitals:   10/20/22 1434  TempSrc:   PainSc: 0-No pain                 Windell Norfolk

## 2022-10-29 DIAGNOSIS — Z79899 Other long term (current) drug therapy: Secondary | ICD-10-CM | POA: Diagnosis not present

## 2022-10-29 DIAGNOSIS — L121 Cicatricial pemphigoid: Secondary | ICD-10-CM | POA: Diagnosis not present

## 2022-11-01 ENCOUNTER — Ambulatory Visit: Payer: Self-pay

## 2022-11-01 ENCOUNTER — Encounter: Payer: Self-pay | Admitting: *Deleted

## 2022-11-01 NOTE — Patient Outreach (Signed)
Care Coordination   11/01/2022 Name: [name] [name] MRN: [id number] DOB: Aug 14, 1950   Care Coordination Outreach Attempts:  An unsuccessful telephone outreach was attempted for a scheduled appointment today.  Follow Up Plan:  Additional outreach attempts will be made to offer the patient care coordination information and services.   Encounter Outcome:  No Answer   Care Coordination Interventions:  No, not indicated    Lysle Morales, BSW Social Worker [facility] Care Management  [phone]

## 2022-11-09 ENCOUNTER — Telehealth: Payer: Self-pay | Admitting: *Deleted

## 2022-11-09 NOTE — Progress Notes (Signed)
Care Coordination Note  11/09/2022 Name: [name] [name] MRN: [id number] DOB: 1950/02/24  [name] [name] is a 72 y.o. year old female who is a primary care patient of Anabel Halon, MD and is actively engaged with the care management team. I reached out to [facility] [place] by phone today to assist with re-scheduling an initial visit with the BSW  Follow up plan: Unsuccessful telephone outreach attempt made. A HIPAA compliant phone message was left for the patient providing contact information and requesting a return call.   [facility]  Care Coordination Care Guide  Direct Dial: [phone]

## 2022-11-10 ENCOUNTER — Other Ambulatory Visit: Payer: Medicare Other | Admitting: Urology

## 2022-11-10 ENCOUNTER — Ambulatory Visit: Payer: Self-pay | Admitting: *Deleted

## 2022-11-10 NOTE — Patient Outreach (Signed)
Care Coordination   11/10/2022  Name: [name] [name] MRN: [id number] DOB: 1950-10-05   Care Coordination Outreach Attempts:  An unsuccessful telephone outreach was attempted today to offer the patient information about available care coordination services. HIPAA compliant messages left on voicemail, providing contact information for CSW, encouraging patient to return CSW's call at her earliest convenience.  Follow Up Plan:  Additional outreach attempts will be made to offer the patient care coordination information and services.   Encounter Outcome:  No Answer.   Care Coordination Interventions:  No, not indicated.    Danford Bad, BSW, MSW, Printmaker Social Work Case Set designer Health  [facility], Population Health Direct Dial: [phone]  Fax: [phone] Email: [email or web address]: [email or web address]

## 2022-11-15 ENCOUNTER — Other Ambulatory Visit: Payer: Self-pay

## 2022-11-15 NOTE — Progress Notes (Signed)
Care Coordination Note  11/15/2022 Name: [name] [name] MRN: [id number] DOB: 25-Nov-1950  [name] [name] is a 72 y.o. year old female who is a primary care patient of [name] Halon, MD and is actively engaged with the care management team. I reached out to [facility] by phone today to assist with re-scheduling an initial visit with the BSW  Follow up plan: Unsuccessful telephone outreach attempt made. A HIPAA compliant phone message was left for the patient providing contact information and requesting a return call.   [facility]  Care Coordination Care Guide  Direct Dial: [phone]

## 2022-11-16 ENCOUNTER — Other Ambulatory Visit: Payer: Self-pay

## 2022-11-17 ENCOUNTER — Ambulatory Visit: Payer: Medicare Other | Admitting: Urology

## 2022-11-17 VITALS — BP 148/72 | HR 90

## 2022-11-17 DIAGNOSIS — N329 Bladder disorder, unspecified: Secondary | ICD-10-CM

## 2022-11-17 DIAGNOSIS — Z87440 Personal history of urinary (tract) infections: Secondary | ICD-10-CM

## 2022-11-17 DIAGNOSIS — R3129 Other microscopic hematuria: Secondary | ICD-10-CM | POA: Diagnosis not present

## 2022-11-17 LAB — URINALYSIS, ROUTINE W REFLEX MICROSCOPIC
Bilirubin, UA: NEGATIVE
Nitrite, UA: NEGATIVE
RBC, UA: NEGATIVE
Specific Gravity, UA: 1.02 (ref 1.005–1.030)
Urobilinogen, Ur: 0.2 mg/dL (ref 0.2–1.0)
pH, UA: 6 (ref 5.0–7.5)

## 2022-11-17 LAB — MICROSCOPIC EXAMINATION

## 2022-11-17 MED ORDER — CIPROFLOXACIN HCL 500 MG PO TABS
500.0000 mg | ORAL_TABLET | Freq: Once | ORAL | Status: AC
Start: 2022-11-17 — End: 2022-11-17
  Administered 2022-11-17: 500 mg via ORAL

## 2022-11-17 NOTE — Progress Notes (Signed)
   11/17/22  CC: microhematuria and UTI   HPI: [name] [name] is a 72yo here for cystoscopy for UTI and microhematuria Blood pressure (!) 148/72, pulse 90. NED. A&Ox3.   No respiratory distress   Abd soft, NT, ND Normal external genitalia with patent urethral meatus  Cystoscopy Procedure Note  Patient identification was confirmed, informed consent was obtained, and patient was prepped using Betadine solution.  Lidocaine jelly was administered per urethral meatus.    Procedure: - Flexible cystoscope introduced, without any difficulty.   - Thorough search of the bladder revealed:    normal urethral meatus    normal urothelium    no stones    no ulcers     no tumors    no urethral polyps    no trabeculation -no fistula noted  - Ureteral orifices were normal in position and appearance.  Post-Procedure: - Patient tolerated the procedure well  Assessment/ Plan: Followup 6 months with [facility]A   No follow-ups on file.  Wilkie Aye, MD

## 2022-11-25 ENCOUNTER — Ambulatory Visit: Payer: Medicare Other | Admitting: Internal Medicine

## 2022-11-25 ENCOUNTER — Encounter: Payer: Self-pay | Admitting: Urology

## 2022-11-25 ENCOUNTER — Encounter: Payer: Self-pay | Admitting: Internal Medicine

## 2022-11-25 ENCOUNTER — Ambulatory Visit (HOSPITAL_COMMUNITY)
Admission: RE | Admit: 2022-11-25 | Discharge: 2022-11-25 | Disposition: A | Discharge status: Home / Self Care | Payer: Medicare Other | Source: Ambulatory Visit | Attending: Internal Medicine | Admitting: Internal Medicine

## 2022-11-25 VITALS — BP 132/75 | HR 78 | Ht 61.0 in | Wt 253.8 lb

## 2022-11-25 DIAGNOSIS — I10 Essential (primary) hypertension: Secondary | ICD-10-CM | POA: Diagnosis not present

## 2022-11-25 DIAGNOSIS — S92512A Displaced fracture of proximal phalanx of left lesser toe(s), initial encounter for closed fracture: Secondary | ICD-10-CM | POA: Insufficient documentation

## 2022-11-25 DIAGNOSIS — M19012 Primary osteoarthritis, left shoulder: Secondary | ICD-10-CM | POA: Diagnosis not present

## 2022-11-25 DIAGNOSIS — E039 Hypothyroidism, unspecified: Secondary | ICD-10-CM

## 2022-11-25 DIAGNOSIS — E1142 Type 2 diabetes mellitus with diabetic polyneuropathy: Secondary | ICD-10-CM | POA: Diagnosis not present

## 2022-11-25 DIAGNOSIS — C50412 Malignant neoplasm of upper-outer quadrant of left female breast: Secondary | ICD-10-CM | POA: Diagnosis not present

## 2022-11-25 DIAGNOSIS — Z794 Long term (current) use of insulin: Secondary | ICD-10-CM

## 2022-11-25 DIAGNOSIS — M25712 Osteophyte, left shoulder: Secondary | ICD-10-CM | POA: Diagnosis not present

## 2022-11-25 DIAGNOSIS — M25512 Pain in left shoulder: Secondary | ICD-10-CM | POA: Diagnosis not present

## 2022-11-25 DIAGNOSIS — M7732 Calcaneal spur, left foot: Secondary | ICD-10-CM | POA: Diagnosis not present

## 2022-11-25 DIAGNOSIS — R2242 Localized swelling, mass and lump, left lower limb: Secondary | ICD-10-CM

## 2022-11-25 DIAGNOSIS — L121 Cicatricial pemphigoid: Secondary | ICD-10-CM

## 2022-11-25 DIAGNOSIS — Z170 Estrogen receptor positive status [ER+]: Secondary | ICD-10-CM | POA: Diagnosis not present

## 2022-11-25 DIAGNOSIS — W19XXXA Unspecified fall, initial encounter: Secondary | ICD-10-CM | POA: Diagnosis not present

## 2022-11-25 DIAGNOSIS — F3340 Major depressive disorder, recurrent, in remission, unspecified: Secondary | ICD-10-CM

## 2022-11-25 NOTE — Progress Notes (Signed)
Established Patient Office Visit  Subjective:  Patient ID: [name] [name], female    DOB: 1950-10-28  Age: 72 y.o. MRN: [id number]  CC:  Chief Complaint  Patient presents with   Diabetes    Four month follow up   Fall    Patient fell Sunday, having pain in shoulder, leg, knee and feet     HPI [name] [name] is a 72 y.o. female with past medical history of HTN, type II DM with neuropathy, OSA, hypothyroidism, OA of multiple joints, gout, MDD, breast cancer s/p lumpectomy and morbid obesity who presents for f/u of her chronic medical conditions.  She had a fall on 11/21/22 due to leg weakness.  She has a cane, but she reports that she has some downslope from 1 room to the other where she might have tripped.  She fell on her left side, denies any head injury.  She had left shoulder, left knee and left foot impact injury.  She still has left great toe swelling and is unable to extend it.  She also has left shoulder pain, recently worse.  She had left knee swelling, which has resolved now.  Of note, she has history of left shoulder and left knee OA.  She has tried taking Tylenol with mild relief.  She denies any prodromal symptoms before the fall.   HTN: Her BP was well controlled in the office today.  She takes Losartan.  She denies any headache, dizziness, chest pain, or palpitations currently.   Type II DM with HLD: Followed by endocrinology.  She is on Ozempic, Lantus 62 U QD and ISS.  She also takes [place].  Her last HbA1C was 6.0. She denies any polyuria or polydipsia currently.  She takes gabapentin for neuropathy, but still has severe burning pain of bilateral feet.  She also has chronic numbness of bilateral feet.  She used to follow-up with neurology, but has not visited them for last few months.  She has history of left breast cancer, s/p lumpectomy.  She is on Aromasin for ER/PR positive breast cancer and follows up with Oncology. She has completed radiation for recurrent breast  cancer.   Past Medical History:  Diagnosis Date   Abdominal wall contusion 12/09/2011   Achilles tendon contracture, bilateral 06/03/2016   Anxiety    Arthritis    Blood dyscrasia    low platelet count- sees [name] , Dr. Nelva Nay ( Note in EPIC from 07/2014) at [facility]   Breast cancer [facility]) 2017   Left Breast   Breast cancer (HCC) 07/2021   right breast IDC   Cancer (HCC)    left breast   Chest wall contusion 12/09/2011   Cirrhosis of liver (HCC)    Cough    Diabetes mellitus without complication (HCC)    Diabetic neuropathy (HCC)    Diarrhea due to drug 01/29/2021   Dyspnea    Gout    Gout    History of radiation therapy 10/22/15 - 12/08/15   left breast 50.4 Gy, boost to 10 Gy   History of radiation therapy    Right breast- 09/22/21-10/30/21- Dr. 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   Neuromuscular disorder (HCC)    diabetic neuropathy   Neuropathy    Personal history of radiation therapy 2017   Left Breast Cancer   Posterior tibial tendon dysfunction (PTTD) of right lower extremity 10/09/2019   Sleep apnea  CPAP nightly   Spleen enlarged    Thyroid disease     Past Surgical History:  Procedure Laterality Date   ANKLE FUSION Right 2022   BIOPSY  10/19/2022   Procedure: BIOPSY;  Surgeon: Dolores Frame, MD;  Location: [facility];  Service: Gastroenterology;;   BREAST BIOPSY Left 08/04/2015   Procedure: LEFT BREAST BIOPSY WITH NEEDLE LOCALIZATION;  Surgeon: Darnell Level, MD;  Location: [facility];  Service: General;  Laterality: Left;   BREAST BIOPSY Right 06/23/2021   BREAST DUCTAL SYSTEM EXCISION Left 08/04/2015   Procedure: LEFT EXCISION DUCTAL SYSTEM BREAST;  Surgeon: Darnell Level, MD;  Location: [facility];  Service: General;  Laterality: Left;   BREAST LUMPECTOMY Left 08/2015   BREAST LUMPECTOMY Right 08/2021   BREAST  LUMPECTOMY WITH AXILLARY LYMPH NODE BIOPSY Left 09/12/2015   Procedure: RE-EXCISION OF LEFT BREAST LUMPECTOMY WITH LEFT AXILLARY LYMPH NODE BIOPSY;  Surgeon: Darnell Level, MD;  Location: [facility];  Service: General;  Laterality: Left;   BREAST LUMPECTOMY WITH RADIOACTIVE SEED LOCALIZATION Right 08/12/2021   Procedure: RIGHT BREAST LUMPECTOMY WITH RADIOACTIVE SEED LOCALIZATION;  Surgeon: Abigail Miyamoto, MD;  Location:  SURGERY CENTER;  Service: General;  Laterality: Right;  LMA   CHOLECYSTECTOMY     ESOPHAGOGASTRODUODENOSCOPY (EGD) WITH PROPOFOL N/A 10/19/2022   Procedure: ESOPHAGOGASTRODUODENOSCOPY (EGD) WITH PROPOFOL;  Surgeon: Dolores Frame, MD;  Location: [facility];  Service: Gastroenterology;  Laterality: N/A;  10:30am asa 3   EYE SURGERY Bilateral 2022   Lashes growing backwards into the eye   JOINT REPLACEMENT     left knee   JOINT REPLACEMENT  2017   right knee   REVERSE SHOULDER ARTHROPLASTY Right 09/07/2018   Procedure: RIGHT REVERSE SHOULDER ARTHROPLASTY;  Surgeon: Cammy Copa, MD;  Location: [facility];  Service: Orthopedics;  Laterality: Right;   TOTAL KNEE ARTHROPLASTY Right 02/28/2015   Procedure: RIGHT TOTAL KNEE ARTHROPLASTY;  Surgeon: Kathryne Hitch, MD;  Location: [facility];  Service: Orthopedics;  Laterality: Right;   TUBAL LIGATION      Family History  Problem Relation Age of Onset   Hypothyroidism Mother    Alzheimer's disease Mother    Diabetes Father    Cancer Sister        GYN cancer   Hypothyroidism Sister    Heart disease Brother    Arthritis Son    Diabetes Son    Arthritis Son    Cancer Maternal Grandmother    Heart failure Maternal Grandfather    Bone cancer Paternal Grandmother    Heart failure Paternal Grandmother     Social History   Socioeconomic History   Marital status: Married    Spouse name: [name] Herro   Number of children: 3   Years of education: 12th   Highest education level: 12th  grade  Occupational History   Occupation: Retired    Comment: Dance movement psychotherapist  Tobacco Use   Smoking status: Never    Passive exposure: Never   Smokeless tobacco: Never  Vaping Use   Vaping status: Never Used  Substance and Sexual Activity   Alcohol use: No   Drug use: No   Sexual activity: Yes    Birth control/protection: Surgical    Comment: BTL  Other Topics Concern   Not on file  Social History Narrative   Not on file   Social Determinants of Health   Financial Resource Strain: Low Risk  (10/28/2022)   Overall  Financial Resource Strain (CARDIA)    Difficulty of Paying Living Expenses: Not very hard  Food Insecurity: No Food Insecurity (10/28/2022)   Hunger Vital Sign    Worried About Running Out of Food in the Last Year: Never true    Ran Out of Food in the Last Year: Never true  Transportation Needs: No Transportation Needs (10/28/2022)   PRAPARE - Administrator, Civil Service (Medical): No    Lack of Transportation (Non-Medical): No  Physical Activity: Sufficiently Active (10/28/2022)   Exercise Vital Sign    Days of Exercise per Week: 7 days    Minutes of Exercise per Session: 30 min  Recent Concern: Physical Activity - Insufficiently Active (10/18/2022)   Exercise Vital Sign    Days of Exercise per Week: 7 days    Minutes of Exercise per Session: 20 min  Stress: Stress Concern Present (10/28/2022)   [employer] of Occupational Health - Occupational Stress Questionnaire    Feeling of Stress : To some extent  Social Connections: Moderately Integrated (10/28/2022)   Social Connection and Isolation Panel [NHANES]    Frequency of Communication with Friends and Family: More than three times a week    Frequency of Social Gatherings with Friends and Family: More than three times a week    Attends Religious Services: More than 4 times per year    Active Member of [employer] or Organizations: No    Attends Banker Meetings: Never    Marital  Status: Married  Recent Concern: Social Connections - Moderately Isolated (10/18/2022)   Social Connection and Isolation Panel [NHANES]    Frequency of Communication with Friends and Family: More than three times a week    Frequency of Social Gatherings with Friends and Family: More than three times a week    Attends Religious Services: Never    Database administrator or Organizations: No    Attends Banker Meetings: Never    Marital Status: Married  Catering manager Violence: Not At Risk (10/28/2022)   Humiliation, Afraid, Rape, and Kick questionnaire    Fear of Current or Ex-Partner: No    Emotionally Abused: No    Physically Abused: No    Sexually Abused: No    Outpatient Medications Prior to Visit  Medication Sig Dispense Refill   ACCU-CHEK AVIVA PLUS test strip USE AS INSTRUCTED TO CHECK BLOOD SUGAR 2 TIMES DAILY 100 strip 5   acetaminophen (TYLENOL) 500 MG tablet Take by mouth as needed.     albuterol (VENTOLIN HFA) 108 (90 Base) MCG/ACT inhaler Inhale 1-2 puffs into the lungs every 4 (four) hours as needed for wheezing or shortness of breath. 18 g 2   Ascorbic Acid (VITAMIN C PO) Take by mouth daily.     buPROPion (WELLBUTRIN XL) 150 MG 24 hr tablet TAKE 1 TABLET BY MOUTH EVERY DAY IN THE MORNING 90 tablet 1   carvedilol (COREG) 3.125 MG tablet Take 1 tablet (3.125 mg total) by mouth 2 (two) times daily with a meal. 180 tablet 0   Cyanocobalamin (VITAMIN B-12 PO) Take by mouth daily.     exemestane (AROMASIN) 25 MG tablet Take 1 tablet (25 mg total) by mouth daily after breakfast. 90 tablet 1   FARXIGA 10 MG TABS tablet TAKE 1 TABLET BY MOUTH DAILY BEFORE BREAKFAST. 90 tablet 3   fluticasone (FLONASE) 50 MCG/ACT nasal spray Place 1 spray into both nostrils as needed.      gabapentin (NEURONTIN) 600 MG  tablet TAKE 1 TABLET IN THE MORNING AND 1 TABLET IN THE AFTERNOON AND 2TABS AT BEDTIME     insulin glargine (LANTUS SOLOSTAR) 100 UNIT/ML Solostar [name] Inject 62 Units  into the skin daily. 30 mL 0   Insulin [name] Needle (B-D UF III MINI [name] NEEDLES) 31G X 5 MM MISC USE AS DIRECTED WITH INSULIN EVERY MORNING, NOON, EVENING AND AT BEDTIME 400 each 3   Insulin [name] Needle 30G X 5 MM MISC 1 Device by Does not apply route in the morning, at noon, in the evening, and at bedtime. 400 each 3   ketoconazole (NIZORAL) 2 % cream Apply 1 Application topically daily. 60 g 0   levothyroxine (SYNTHROID) 150 MCG tablet Take 1 tablet (150 mcg total) by mouth daily. 90 tablet 3   losartan (COZAAR) 25 MG tablet Take 1 tablet (25 mg total) by mouth daily. 90 tablet 1   methocarbamol (ROBAXIN) 500 MG tablet Take 1 tablet (500 mg total) by mouth every 8 (eight) hours as needed for muscle spasms. 30 tablet 0   mirabegron ER (MYRBETRIQ) 25 MG TB24 tablet Take 1 tablet (25 mg total) by mouth daily. 30 tablet 11   Multiple Vitamins-Minerals (MULTIVITAMIN WITH MINERALS) tablet Take 1 tablet by mouth daily.     mycophenolate (CELLCEPT) 500 MG tablet Take 1,500 mg by mouth 2 (two) times daily.     omeprazole (PRILOSEC) 20 MG capsule Take 1 capsule (20 mg total) by mouth daily. 90 capsule 3   promethazine-dextromethorphan (PROMETHAZINE-DM) 6.25-15 MG/5ML syrup Take 5 mLs by mouth 4 (four) times daily as needed for cough. 180 mL 0   Pyridoxine HCl (VITAMIN B-6 PO) Take by mouth daily.     Semaglutide, 1 MG/DOSE, (OZEMPIC, 1 MG/DOSE,) 4 MG/3ML SOPN Inject 1 mg into the skin once a week. 3 mL 5   traMADol (ULTRAM) 50 MG tablet Take 1 tablet (50 mg total) by mouth every 12 (twelve) hours as needed. 30 tablet 1   UNABLE TO FIND 1 each by Does not apply route daily. Med Name: POWER WHEELCHAIR  DX CODE-R26.89 1 each 0   VITAMIN D PO Take by mouth daily.     guaiFENesin-codeine (CHERATUSSIN AC) 100-10 MG/5ML syrup Take 5 mLs by mouth 3 (three) times daily as needed for cough. 120 mL 0   UNABLE TO FIND 1 each by Does not apply route daily. Med Name: POWER SCOOTER DX CODE-R26.89 1 each 0   No  facility-administered medications prior to visit.    Allergies  Allergen Reactions   Codeine Swelling   Influenza A (H1n1) Monoval Pf Anaphylaxis   Hydrocodone-Acetaminophen Other (See Comments)   Lisinopril Other (See Comments)   Metformin Other (See Comments)   Pregabalin Other (See Comments)   Jardiance [Empagliflozin] Other (See Comments)    dehydration    ROS Review of Systems  Constitutional:  Negative for chills and fever.  HENT:  Negative for congestion, sinus pressure and sinus pain.   Respiratory:  Negative for cough, shortness of breath and wheezing.   Cardiovascular:  Negative for chest pain and palpitations.  Gastrointestinal:  Negative for diarrhea, nausea and vomiting.  Genitourinary:  Negative for dysuria and hematuria.  Musculoskeletal:  Positive for arthralgias, back pain, gait problem and neck pain.  Skin:  Negative for rash.  Neurological:  Positive for weakness. Negative for dizziness.  Psychiatric/Behavioral:  Negative for agitation and behavioral problems.       Objective:    Physical Exam Vitals reviewed.  Constitutional:  General: She is not in acute distress.    Appearance: She is obese. She is not diaphoretic.  HENT:     Head: Normocephalic and atraumatic.     Nose: No congestion.     Mouth/Throat:     Mouth: Mucous membranes are moist.  Eyes:     General: No scleral icterus.    Extraocular Movements: Extraocular movements intact.  Cardiovascular:     Rate and Rhythm: Normal rate and regular rhythm.     Pulses: Normal pulses.     Heart sounds: Normal heart sounds. No murmur heard. Pulmonary:     Breath sounds: Normal breath sounds. No wheezing or rales.  Musculoskeletal:     Left shoulder: Tenderness present. Decreased range of motion. Decreased strength.     Cervical back: Neck supple. No tenderness.     Right lower leg: No edema.     Left lower leg: No edema.     Comments: Left great toe swelling and tenderness  Skin:     General: Skin is warm.     Findings: No rash.  Neurological:     General: No focal deficit present.     Mental Status: She is alert and oriented to person, place, and time.     Sensory: Sensory deficit present.     Motor: Weakness (4/5 in b/l LE) present.     Gait: Gait abnormal.  Psychiatric:        Mood and Affect: Mood normal.        Behavior: Behavior normal.     BP 132/75 (BP Location: Left Arm, Patient Position: Sitting, Cuff Size: Large)   Pulse 78   Ht 5\' 1"  (1.549 m)   Wt 253 lb 12.8 oz (115.1 kg)   SpO2 95%   BMI 47.96 kg/m  Wt Readings from Last 3 Encounters:  11/25/22 253 lb 12.8 oz (115.1 kg)  10/19/22 246 lb 14.6 oz (112 kg)  10/18/22 248 lb (112.5 kg)    Lab Results  Component Value Date   TSH 4.28 12/14/2021   Lab Results  Component Value Date   WBC 2.9 (L) 10/13/2022   HGB 13.1 10/13/2022   HCT 40.7 10/13/2022   MCV 91.5 10/13/2022   PLT 63 (L) 10/13/2022   Lab Results  Component Value Date   NA 136 10/13/2022   K 3.5 10/13/2022   CHLORIDE 104 10/06/2016   CO2 23 10/13/2022   GLUCOSE 148 (H) 10/13/2022   BUN 16 10/13/2022   CREATININE 0.77 10/13/2022   BILITOT 0.7 08/09/2022   ALKPHOS 106 07/29/2022   AST 28 08/09/2022   ALT 16 08/09/2022   PROT 7.1 08/09/2022   ALBUMIN 3.8 07/29/2022   CALCIUM 9.7 10/13/2022   ANIONGAP 10 10/13/2022   EGFR 50 (L) 10/06/2016   GFR 50.27 (L) 08/24/2019   Lab Results  Component Value Date   CHOL 171 06/09/2021   Lab Results  Component Value Date   HDL 63.30 06/09/2021   Lab Results  Component Value Date   LDLCALC 80 06/09/2021   Lab Results  Component Value Date   TRIG 137.0 06/09/2021   Lab Results  Component Value Date   CHOLHDL 3 06/09/2021   Lab Results  Component Value Date   HGBA1C 6.0 08/17/2022      Assessment & Plan:   Problem List Items Addressed This Visit       Cardiovascular and Mediastinum   Essential hypertension - Primary    BP Readings from Last 1 Encounters:  11/25/22 132/75   Well-controlled with Losartan Counseled for compliance with the medications Advised DASH diet and moderate exercise/walking as tolerated        Endocrine   Type 2 diabetes mellitus with diabetic polyneuropathy, with long-term current use of insulin (HCC)    Lab Results  Component Value Date   HGBA1C 5.7 04/15/2022   Well-controlled On Lantus and ISS - followed by Endocrinology On Ozempic and Farxiga Advised to follow diabetic diet On ARB Diabetic eye exam: Advised to follow up with Ophthalmology for diabetic eye exam      Acquired hypothyroidism    On Levothyroxine 150 mcg QD Followed by Endocrinology        Other   Malignant neoplasm of upper-outer quadrant of left breast in female, estrogen receptor positive (HCC)    S/p lumpectomy, followed by Oncology On Aromasin Had recurrence, s/p radiation      Recurrent major depression in remission (HCC)    Overall well-controlled with Wellbutrin 150 mg QD      Ocular cicatricial pemphigoid    On mycophenolate and oral steroids Followed by ophthalmology at [facility] - planning to start immunoglobulin treatment      Fall    Likely mechanical fall due to weakness of legs, likely multifactorial from OA of knee, ankle and polyneuropathy Uses cane for walking support Check x-ray of left shoulder and left foot Left knee swelling and pain has improved now Tylenol as needed for pain Continue gabapentin for neuropathy If x-rays negative for fracture, will recommend home PT      Relevant Orders   DG Foot Complete Left   DG Shoulder Left   Other Visit Diagnoses     Localized swelling of toe of left foot       Relevant Orders   DG Foot Complete Left        No orders of the defined types were placed in this encounter.   Follow-up: Return in about 4 months (around 03/28/2023).    Anabel Halon, MD

## 2022-11-25 NOTE — Assessment & Plan Note (Signed)
Lab Results  Component Value Date   HGBA1C 5.7 04/15/2022   Well-controlled On Lantus and ISS - followed by Endocrinology On Ozempic and Farxiga Advised to follow diabetic diet On ARB Diabetic eye exam: Advised to follow up with Ophthalmology for diabetic eye exam

## 2022-11-25 NOTE — Assessment & Plan Note (Signed)
Likely mechanical fall due to weakness of legs, likely multifactorial from OA of knee, ankle and polyneuropathy Uses cane for walking support Check x-ray of left shoulder and left foot Left knee swelling and pain has improved now Tylenol as needed for pain Continue gabapentin for neuropathy If x-rays negative for fracture, will recommend home PT

## 2022-11-25 NOTE — Assessment & Plan Note (Signed)
BP Readings from Last 1 Encounters:  11/25/22 132/75   Well-controlled with Losartan Counseled for compliance with the medications Advised DASH diet and moderate exercise/walking as tolerated

## 2022-11-25 NOTE — Assessment & Plan Note (Addendum)
On Levothyroxine 150 mcg QD Followed by Endocrinology

## 2022-11-25 NOTE — Assessment & Plan Note (Signed)
S/p lumpectomy, followed by Oncology On Aromasin Had recurrence, s/p radiation

## 2022-11-25 NOTE — Assessment & Plan Note (Addendum)
On mycophenolate and oral steroids Followed by ophthalmology at [facility] - planning to start immunoglobulin treatment

## 2022-11-25 NOTE — Assessment & Plan Note (Signed)
Overall well-controlled with Wellbutrin 150 mg QD

## 2022-11-25 NOTE — Patient Instructions (Signed)
Urinary Tract Infection, Adult  A urinary tract infection (UTI) is an infection of any part of the urinary tract. The urinary tract includes the kidneys, ureters, bladder, and urethra. These organs make, store, and get rid of urine in the body. An upper UTI affects the ureters and kidneys. A lower UTI affects the bladder and urethra. What are the causes? Most urinary tract infections are caused by bacteria in your genital area around your urethra, where urine leaves your body. These bacteria grow and cause inflammation of your urinary tract. What increases the risk? You are more likely to develop this condition if: You have a urinary catheter that stays in place. You are not able to control when you urinate or have a bowel movement (incontinence). You are female and you: Use a spermicide or diaphragm for birth control. Have low estrogen levels. Are pregnant. You have certain genes that increase your risk. You are sexually active. You take antibiotic medicines. You have a condition that causes your flow of urine to slow down, such as: An enlarged prostate, if you are female. Blockage in your urethra. A kidney stone. A nerve condition that affects your bladder control (neurogenic bladder). Not getting enough to drink, or not urinating often. You have certain medical conditions, such as: Diabetes. A weak disease-fighting system (immunesystem). Sickle cell disease. Gout. Spinal cord injury. What are the signs or symptoms? Symptoms of this condition include: Needing to urinate right away (urgency). Frequent urination. This may include small amounts of urine each time you urinate. Pain or burning with urination. Blood in the urine. Urine that smells bad or unusual. Trouble urinating. Cloudy urine. Vaginal discharge, if you are female. Pain in the abdomen or the lower back. You may also have: Vomiting or a decreased appetite. Confusion. Irritability or tiredness. A fever or  chills. Diarrhea. The first symptom in older adults may be confusion. In some cases, they may not have any symptoms until the infection has worsened. How is this diagnosed? This condition is diagnosed based on your medical history and a physical exam. You may also have other tests, including: Urine tests. Blood tests. Tests for STIs (sexually transmitted infections). If you have had more than one UTI, a cystoscopy or imaging studies may be done to determine the cause of the infections. How is this treated? Treatment for this condition includes: Antibiotic medicine. Over-the-counter medicines to treat discomfort. Drinking enough water to stay hydrated. If you have frequent infections or have other conditions such as a kidney stone, you may need to see a health care provider who specializes in the urinary tract (urologist). In rare cases, urinary tract infections can cause sepsis. Sepsis is a life-threatening condition that occurs when the body responds to an infection. Sepsis is treated in the hospital with IV antibiotics, fluids, and other medicines. Follow these instructions at home:  Medicines Take over-the-counter and prescription medicines only as told by your health care provider. If you were prescribed an antibiotic medicine, take it as told by your health care provider. Do not stop using the antibiotic even if you start to feel better. General instructions Make sure you: Empty your bladder often and completely. Do not hold urine for long periods of time. Empty your bladder after sex. Wipe from front to back after urinating or having a bowel movement if you are female. Use each tissue only one time when you wipe. Drink enough fluid to keep your urine pale yellow. Keep all follow-up visits. This is important. Contact a health   care provider if: Your symptoms do not get better after 1-2 days. Your symptoms go away and then return. Get help right away if: You have severe pain in  your back or your lower abdomen. You have a fever or chills. You have nausea or vomiting. Summary A urinary tract infection (UTI) is an infection of any part of the urinary tract, which includes the kidneys, ureters, bladder, and urethra. Most urinary tract infections are caused by bacteria in your genital area. Treatment for this condition often includes antibiotic medicines. If you were prescribed an antibiotic medicine, take it as told by your health care provider. Do not stop using the antibiotic even if you start to feel better. Keep all follow-up visits. This is important. This information is not intended to replace advice given to you by your health care provider. Make sure you discuss any questions you have with your health care provider. Document Revised: 08/26/2019 Document Reviewed: 08/31/2019 Elsevier Patient Education  2024 Elsevier Inc.  

## 2022-11-25 NOTE — Patient Instructions (Signed)
Please get X-ray of left foot and shoulder done at [facility]  Please contact Orthopedic surgeon if you have worsening or persistent shoulder pain.  Please continue to take medications as prescribed.  Please continue to follow low carb diet and ambulate as tolerated.

## 2022-12-14 ENCOUNTER — Telehealth: Payer: Self-pay | Admitting: Internal Medicine

## 2022-12-14 NOTE — Telephone Encounter (Signed)
Copied from CRM [id number]. Topic: Clinical - Lab/Test Results >> Dec 14, 2022 10:02 AM Prudencio Pair wrote: Reason for CRM: Patient, Mrs. [name] [name], calling in wanting her x-ray results on shoulder and foot. Checked encounters for x-rays and saw that it hasn't been released. Advised pt that clinical staff would reach out to her in regards to this information. Patient would like a CB at [phone].

## 2022-12-14 NOTE — Telephone Encounter (Signed)
Awaiting results

## 2022-12-15 ENCOUNTER — Ambulatory Visit: Payer: Self-pay | Admitting: *Deleted

## 2022-12-15 NOTE — Patient Instructions (Signed)
Visit Information  Thank you for taking time to visit with me today. Please don't hesitate to contact me if I can be of assistance to you.   Following are the goals we discussed today:   Goals Addressed               This Visit's Progress     Receive Help in The Home. (pt-stated)   On track     Care Coordination Interventions:  Interventions Today    Flowsheet Row Most Recent Value  Chronic Disease   Chronic disease during today's visit Diabetes, Hypertension (HTN), Other  [Recurrent Falls, Recurrent Major Depression, Morbid Obesity, Anxiety Disorder, Bilateral Lower Extremity Edema,]  General Interventions   General Interventions Discussed/Reviewed General Interventions Discussed, Labs, Vaccines, Doctor Visits, Referral to Nurse, Health Screening, Annual Foot Exam, General Interventions Reviewed, Lipid Profile, Communication with, [employer], Level of Care, Horticulturist, commercial (DME), Annual Eye Exam  [Encouraged]  Labs Hgb A1c every 3 months, Kidney Function  [Encouraged]  Vaccines COVID-19, Flu, Pneumonia, RSV, Shingles, Tetanus/Pertussis/Diphtheria  [Encouraged]  Doctor Visits Discussed/Reviewed Doctor Visits Discussed, Specialist, Doctor Visits Reviewed, Annual Wellness Visits, PCP  [Encouraged]  Health Screening Bone Density, Colonoscopy, Mammogram  [Encouraged]  Durable Medical Equipment (DME) Dan Humphreys  [Encouraged]  PCP/Specialist Visits Compliance with follow-up visit  [Encouraged]  Communication with PCP/Specialists, RN, Pharmacists, Social Work  [Encouraged]  Level of Care Adult Daycare, Development worker, international aid, Air traffic controller, Assisted Living, Skilled Nursing Facility  [Encouraged]  Applications Medicaid, Personal Care Services, FL-2  [Encouraged]  Exercise Interventions   Exercise Discussed/Reviewed Exercise Discussed, Assistive device use and maintanence, Exercise Reviewed, Physical Activity, Weight Managment  [Encouraged]  Physical Activity Discussed/Reviewed  Physical Activity Discussed, Home Exercise Program (HEP), PREP, Physical Activity Reviewed, Gym, Types of exercise  [Encouraged]  Weight Management Weight loss  [Encouraged]  Education Interventions   Education Provided Provided Therapist, sports, Provided Web-based Education, Provided Education  [Encouraged]  Provided Verbal Education On Nutrition, Mental Health/Coping with Illness, When to see the doctor, Foot Care, Eye Care, Labs, Blood Sugar Monitoring, Applications, Exercise, [employer], [employer], Medication  [Encouraged]  Labs Reviewed --  [N/A]  Applications Medicaid, Personal Care Services, FL-2  [Encouraged]  Mental Health Interventions   Mental Health Discussed/Reviewed Mental Health Discussed, Anxiety, Depression, Grief and Loss, Mental Health Reviewed, Substance Abuse, Coping Strategies, Suicide, Crisis, Other  [Domestic Violence]  Nutrition Interventions   Nutrition Discussed/Reviewed Nutrition Discussed, Nutrition Reviewed, Carbohydrate meal planning, Decreasing sugar intake, Supplemental nutrition, Portion sizes, Decreasing salt, Fluid intake, Decreasing fats, Increasing proteins, Adding fruits and vegetables  [Encouraged]  Pharmacy Interventions   Pharmacy Dicussed/Reviewed Medications and their functions, Medication Adherence, Affording Medications, Pharmacy Topics Discussed, Pharmacy Topics Reviewed  [Encouraged]  Medication Adherence --  [N/A]  Safety Interventions   Safety Discussed/Reviewed Safety Discussed, Safety Reviewed, Fall Risk, Home Safety  [Encouraged]  Home Safety Assistive Devices, Need for home safety assessment, Refer for home visit, Refer for community resources  [Encouraged]  Advanced Directive Interventions   Advanced Directives Discussed/Reviewed Advanced Directives Discussed  [Encouraged]      Active Listening & Reflection Utilized.  Verbalization of Feelings Encouraged.  Emotional Support Provided. Self-Enrollment in Caregiver Support  Group of Interest Emphasized. Problem Solving Interventions Implemented. Task-Centered Solutions Activated.   Solution-Focused Strategies Employed. Acceptance & Commitment Therapy Initiated. Brief Cognitive Behavioral Therapy Performed. Client-Centered Therapy Conducted. CSW Collaboration with Dr. Trena Platt, Primary Care Provider with [facility] [phone]), Via Routed Note in Epic, to Report Unsteady Balance/Gait,  History of Falls, 3 Within Last Month, & Symptoms of Vertigo. CSW Collaboration with Dr. Trena Platt, Primary Care Provider with [facility] [phone]), Via Routed Note in Epic, to Request Referral to [facility] (# [phone]), to Address Symptoms of Vertigo.  CSW Collaboration with Dr. Trena Platt, Primary Care Provider with [facility] [phone]), Via Routed Note in Epic, to Request Order for [employer] Motorized Scooter Be Faxed to [employer] of Choice in [place].  Confirmed Artist & Widening of Door Frames to Magazine features editor.   Confirmed Disinterest in Pursuing Higher Level of Care Placement Options (I.e. Assisted Living, Extended Care, Intermediate Level, Etc.). Confirmed Receipt, Thoroughly Reviewed & Encouraged Engagement with [employer], Nurse, adult, Wellsite geologist of Interest in [place], from List Provided: ~ In-Home Care & Respite Agencies ~ Home Health Care Agencies ~ Respite Care Agencies & Facilities ~ Personal Care Service Agency Providers Encouraged Self-Enrollment with Psychiatrist of Interest in [place], from List Provided, to Receive Psychotropic Medication Administration & Management, in An Effort to Reduce & Manage Symptoms of Anxiety, Depression, Grief & Loss. Encouraged Self-Enrollment with Therapist of Interest in [facility], from List Provided, to Receive Psychotherapeutic Counseling & Supportive Services, in An Effort to Reduce & Manage Symptoms of Anxiety, Depression, Grief & Loss.  Encouraged Self-Enrollment with Grief & Loss Support Group of Interest in [facility], from List Provided, to [employer], Education, Counseling, Resources, Sales promotion account executive, Etc., in An Effort to Reduce & Manage Symptoms of Anxiety, Depression, Grief & Loss. Confirmed Receipt, Thoroughly Reviewed & Encouraged Initiation of Advance Directives (Living Will & Healthcare Power of Attorney Documents) & Submission to Dr. Trena Platt, Primary Care Provider with [facility] [phone]), to Scan Into Electronic Medical Record in Epic. Encouraged Contact with CSW (# [phone]), if You Have Questions, Need Assistance, or If Additional Social Work Needs Are Identified Between Now & Our Next Follow-Up Outreach Call, Scheduled on 12/29/2022 at 10:45 AM.      Our next appointment is by telephone on 12/29/2022 at 10:45 am.  Please call the care guide team at [phone] if you need to cancel or reschedule your appointment.   If you are experiencing a Mental Health or Behavioral Health Crisis or need someone to talk to, please call the Suicide and Crisis Lifeline: 988 call the [place] National Suicide Prevention Lifeline: 579 210 3466 or TTY: (214)003-1952 TTY 910-033-0127) to talk to a trained counselor call 1-800-273-TALK (toll free, 24 hour hotline) go to [facility] [address] [place] [phone]) call the [facility] Crisis Line: [phone] call 911  Patient verbalizes understanding of instructions and care plan provided today and agrees to view in MyChart. Active MyChart status and patient understanding of how to access instructions and care plan via MyChart confirmed with patient.     Telephone follow up appointment with care management team member  scheduled for:  12/29/2022 at 10:45 am.  Danford Bad, BSW, MSW, LCSW  Embedded Practice Social Work Case Manager  [facility], Population Health Direct Dial: [phone]  Fax: [phone] Email: [email or web address]: [email or web address]

## 2022-12-15 NOTE — Patient Outreach (Signed)
Care Coordination   Follow Up Visit Note   12/16/2022  Name: Karter Hlavaty Wentworth MRN: [id number] DOB: 07/17/1950  Maceo Pro Buckhalter is a 72 y.o. year old female who sees Anabel Halon, MD for primary care. I spoke with Willaim Sheng A Cannata by phone today.  What matters to the patients health and wellness today?  Receive Help in The Home.   Goals Addressed               This Visit's Progress     Receive Help in The Home. (pt-stated)   On track     Care Coordination Interventions:  Interventions Today    Flowsheet Row Most Recent Value  Chronic Disease   Chronic disease during today's visit Diabetes, Hypertension (HTN), Other  [Recurrent Falls, Recurrent Major Depression, Morbid Obesity, Anxiety Disorder, Bilateral Lower Extremity Edema,]  General Interventions   General Interventions Discussed/Reviewed General Interventions Discussed, Labs, Vaccines, Doctor Visits, Referral to Nurse, Health Screening, Annual Foot Exam, General Interventions Reviewed, Lipid Profile, Communication with, [employer], Level of Care, Horticulturist, commercial (DME), Annual Eye Exam  [Encouraged]  Labs Hgb A1c every 3 months, Kidney Function  [Encouraged]  Vaccines COVID-19, Flu, Pneumonia, RSV, Shingles, Tetanus/Pertussis/Diphtheria  [Encouraged]  Doctor Visits Discussed/Reviewed Doctor Visits Discussed, Specialist, Doctor Visits Reviewed, Annual Wellness Visits, PCP  [Encouraged]  Health Screening Bone Density, Colonoscopy, Mammogram  [Encouraged]  Durable Medical Equipment (DME) Dan Humphreys  [Encouraged]  PCP/Specialist Visits Compliance with follow-up visit  [Encouraged]  Communication with PCP/Specialists, RN, Pharmacists, Social Work  [Encouraged]  Level of Care Adult Daycare, Development worker, international aid, Air traffic controller, Assisted Living, Skilled Nursing Facility  [Encouraged]  Applications Medicaid, Personal Care Services, FL-2  [Encouraged]  Exercise Interventions   Exercise Discussed/Reviewed Exercise Discussed,  Assistive device use and maintanence, Exercise Reviewed, Physical Activity, Weight Managment  [Encouraged]  Physical Activity Discussed/Reviewed Physical Activity Discussed, Home Exercise Program (HEP), PREP, Physical Activity Reviewed, Gym, Types of exercise  [Encouraged]  Weight Management Weight loss  [Encouraged]  Education Interventions   Education Provided Provided Therapist, sports, Provided Web-based Education, Provided Education  [Encouraged]  Provided Verbal Education On Nutrition, Mental Health/Coping with Illness, When to see the doctor, Foot Care, Eye Care, Labs, Blood Sugar Monitoring, Applications, Exercise, [employer], [employer], Medication  [Encouraged]  Labs Reviewed --  [N/A]  Applications Medicaid, Personal Care Services, FL-2  [Encouraged]  Mental Health Interventions   Mental Health Discussed/Reviewed Mental Health Discussed, Anxiety, Depression, Grief and Loss, Mental Health Reviewed, Substance Abuse, Coping Strategies, Suicide, Crisis, Other  [Domestic Violence]  Nutrition Interventions   Nutrition Discussed/Reviewed Nutrition Discussed, Nutrition Reviewed, Carbohydrate meal planning, Decreasing sugar intake, Supplemental nutrition, Portion sizes, Decreasing salt, Fluid intake, Decreasing fats, Increasing proteins, Adding fruits and vegetables  [Encouraged]  Pharmacy Interventions   Pharmacy Dicussed/Reviewed Medications and their functions, Medication Adherence, Affording Medications, Pharmacy Topics Discussed, Pharmacy Topics Reviewed  [Encouraged]  Medication Adherence --  [N/A]  Safety Interventions   Safety Discussed/Reviewed Safety Discussed, Safety Reviewed, Fall Risk, Home Safety  [Encouraged]  Home Safety Assistive Devices, Need for home safety assessment, Refer for home visit, Refer for community resources  [Encouraged]  Advanced Directive Interventions   Advanced Directives Discussed/Reviewed Advanced Directives Discussed  [Encouraged]       Active Listening & Reflection Utilized.  Verbalization of Feelings Encouraged.  Emotional Support Provided. Self-Enrollment in Caregiver Support Group of Interest Emphasized. Problem Solving Interventions Implemented. Task-Centered Solutions Activated.   Solution-Focused Strategies Employed. Acceptance & Commitment Therapy Initiated. Brief Cognitive Behavioral Therapy Performed.  Client-Centered Therapy Conducted. CSW Collaboration with Dr. Trena Platt, Primary Care Provider with [facility] [phone]), Via Routed Note in Epic, to Report Unsteady Balance/Gait, History of Falls, 3 Within Last Month, & Symptoms of Vertigo. CSW Collaboration with Dr. Trena Platt, Primary Care Provider with [facility] [phone]), Via Routed Note in Epic, to Request Referral to [facility] (# [phone]), to Address Symptoms of Vertigo.  CSW Collaboration with Dr. Trena Platt, Primary Care Provider with [facility] [phone]), Via Routed Note in Epic, to Request Order for [employer] Motorized Scooter Be Faxed to [employer] of Choice in [place].  Confirmed Artist & Widening of Door Frames to Magazine features editor.   Confirmed Disinterest in Pursuing Higher Level of Care Placement Options (I.e. Assisted Living, Extended Care, Intermediate Level, Etc.). Confirmed Receipt, Thoroughly Reviewed & Encouraged Engagement with [employer], Nurse, adult, Wellsite geologist of Interest in [place], from List Provided: ~ In-Home Care & Respite Agencies ~ Home Health Care Agencies ~ Respite Care Agencies & Facilities ~ Personal Care Service Agency Providers Encouraged Self-Enrollment with Psychiatrist of Interest in [place], from List Provided, to Receive Psychotropic Medication Administration &  Management, in An Effort to Reduce & Manage Symptoms of Anxiety, Depression, Grief & Loss. Encouraged Self-Enrollment with Therapist of Interest in [facility], from List Provided, to Receive Psychotherapeutic Counseling & Supportive Services, in An Effort to Reduce & Manage Symptoms of Anxiety, Depression, Grief & Loss.  Encouraged Self-Enrollment with Grief & Loss Support Group of Interest in [facility], from List Provided, to [employer], Education, Counseling, Resources, Sales promotion account executive, Etc., in An Effort to Reduce & Manage Symptoms of Anxiety, Depression, Grief & Loss. Confirmed Receipt, Thoroughly Reviewed & Encouraged Initiation of Advance Directives (Living Will & Healthcare Power of Attorney Documents) & Submission to Dr. Trena Platt, Primary Care Provider with [facility] [phone]), to Scan Into Electronic Medical Record in Epic. Encouraged Contact with CSW (# [phone]), if You Have Questions, Need Assistance, or If Additional Social Work Needs Are Identified Between Now & Our Next Follow-Up Outreach Call, Scheduled on 12/29/2022 at 10:45 AM.      SDOH assessments and interventions completed:  Yes.  Care Coordination Interventions:  Yes, provided.   Follow up plan: Follow up call scheduled for 12/29/2022 at 10:45 am.  Encounter Outcome:  Patient Visit Completed.   Danford Bad, BSW, MSW, Printmaker Social Work Case Set designer Health  [facility], Population Health Direct Dial: [phone]  Fax: [phone] Email: [email or web address] Website: [email or web address]

## 2022-12-20 ENCOUNTER — Other Ambulatory Visit: Payer: Self-pay

## 2022-12-20 DIAGNOSIS — E559 Vitamin D deficiency, unspecified: Secondary | ICD-10-CM | POA: Diagnosis not present

## 2022-12-20 DIAGNOSIS — Z8616 Personal history of COVID-19: Secondary | ICD-10-CM | POA: Diagnosis not present

## 2022-12-20 DIAGNOSIS — K7581 Nonalcoholic steatohepatitis (NASH): Secondary | ICD-10-CM | POA: Diagnosis not present

## 2022-12-20 DIAGNOSIS — E118 Type 2 diabetes mellitus with unspecified complications: Secondary | ICD-10-CM | POA: Diagnosis not present

## 2022-12-20 DIAGNOSIS — R2242 Localized swelling, mass and lump, left lower limb: Secondary | ICD-10-CM

## 2022-12-20 DIAGNOSIS — E1142 Type 2 diabetes mellitus with diabetic polyneuropathy: Secondary | ICD-10-CM | POA: Diagnosis not present

## 2022-12-20 DIAGNOSIS — E785 Hyperlipidemia, unspecified: Secondary | ICD-10-CM | POA: Diagnosis not present

## 2022-12-20 DIAGNOSIS — E11649 Type 2 diabetes mellitus with hypoglycemia without coma: Secondary | ICD-10-CM | POA: Diagnosis not present

## 2022-12-20 DIAGNOSIS — I10 Essential (primary) hypertension: Secondary | ICD-10-CM | POA: Diagnosis not present

## 2022-12-20 DIAGNOSIS — E039 Hypothyroidism, unspecified: Secondary | ICD-10-CM | POA: Diagnosis not present

## 2022-12-20 DIAGNOSIS — K7460 Unspecified cirrhosis of liver: Secondary | ICD-10-CM | POA: Diagnosis not present

## 2022-12-22 ENCOUNTER — Other Ambulatory Visit: Payer: Self-pay

## 2022-12-25 ENCOUNTER — Other Ambulatory Visit: Payer: Self-pay | Admitting: Hematology and Oncology

## 2022-12-25 ENCOUNTER — Other Ambulatory Visit (INDEPENDENT_AMBULATORY_CARE_PROVIDER_SITE_OTHER): Payer: Self-pay | Admitting: Gastroenterology

## 2022-12-25 ENCOUNTER — Other Ambulatory Visit: Payer: Self-pay | Admitting: Internal Medicine

## 2022-12-29 ENCOUNTER — Encounter: Payer: Self-pay | Admitting: *Deleted

## 2022-12-29 ENCOUNTER — Ambulatory Visit: Payer: Self-pay | Admitting: *Deleted

## 2022-12-29 NOTE — Patient Outreach (Signed)
Care Coordination   Follow Up Visit Note   12/29/2022  Name: [name] [name] MRN: [id number] DOB: February 14, 1950  [name] [name] is a 72 y.o. year old female who sees Anabel Halon, MD for primary care. I spoke with Willaim Sheng A Cara by phone today.  What matters to the patients health and wellness today?  Receive Help in The Home.    Goals Addressed               This Visit's Progress     Receive Help in The Home. (pt-stated)   On track     Care Coordination Interventions:  Interventions Today    Flowsheet Row Most Recent Value  Chronic Disease   Chronic disease during today's visit Diabetes, Hypertension (HTN), Other  [Recurrent Falls, Recurrent Major Depression, Morbid Obesity, Anxiety Disorder, Bilateral Lower Extremity Edema, Nonalcoholic Steatohepatitis, Cirrhosis of Liver without Acites, Degenerative Arthritis of Spine, Arthritis of Shoulder, Gout]  General Interventions   General Interventions Discussed/Reviewed General Interventions Discussed, General Interventions Reviewed, Durable Medical Equipment (DME), Health Screening, Community Resources, Vaccines, Doctor Visits, Communication with, Level of Care  [Communication with Care Team Members]  Vaccines COVID-19, Flu, Pneumonia, RSV, Shingles, Tetanus/Pertussis/Diphtheria  [Encouraged]  Doctor Visits Discussed/Reviewed Doctor Visits Discussed, Specialist, Doctor Visits Reviewed, Annual Wellness Visits, PCP  [Encouraged]  Health Screening Bone Density, Colonoscopy, Mammogram  [Encouraged]  Durable Medical Equipment (DME) BP Cuff, Dan Humphreys, Other  [Cane & Prescription Eyeglasses]  PCP/Specialist Visits Compliance with follow-up visit  [Encouraged Routine Engagement]  Communication with PCP/Specialists, RN, Pharmacists, Social Work  [employer] Routine Engagement]  Level of Care Adult Daycare, Development worker, international aid, Air traffic controller, Assisted Living, Skilled Nursing Facility  [Confirmed Disinterest in Enrolling in Adult Day Care Program or  Applying for Medicaid or Personal Care Services]  Applications Medicaid, Personal Care Services  [Confirmed Disinterest in Pursuing Higher Level of Care Placement Options]  Exercise Interventions   Exercise Discussed/Reviewed Exercise Discussed, Assistive device use and maintanence, Exercise Reviewed, Physical Activity, Weight Managment  [Encouraged]  Physical Activity Discussed/Reviewed Physical Activity Discussed, Home Exercise Program (HEP), PREP, Gym, Physical Activity Reviewed, Types of exercise  [Encouraged]  Weight Management Weight loss  [Encouraged]  Education Interventions   Education Provided Provided Education  Provided Verbal Education On Nutrition, Mental Health/Coping with Illness, When to see the doctor, Blood Sugar Monitoring, Applications, Exercise, [employer], Development worker, community, Medication  [employer], Personal Care Services  [Confirmed Disinterest in Pursuing Higher Level of Care Placement Options]  Mental Health Interventions   Mental Health Discussed/Reviewed Mental Health Discussed, Anxiety, Depression, Grief and Loss, Mental Health Reviewed, Substance Abuse, Coping Strategies, Suicide, Crisis  [Assessed Mental Health Status]  Nutrition Interventions   Nutrition Discussed/Reviewed Nutrition Discussed, Adding fruits and vegetables, Nutrition Reviewed, Fluid intake, Carbohydrate meal planning, Portion sizes, Decreasing sugar intake, Increasing proteins, Decreasing fats, Decreasing salt  [Encouraged]  Pharmacy Interventions   Pharmacy Dicussed/Reviewed Pharmacy Topics Discussed, Medications and their functions, Pharmacy Topics Reviewed, Medication Adherence, Affording Medications  [Confirmed Ability to Afford Medications]  Medication Adherence --  [Confirmed Medication Compliance]  Safety Interventions   Safety Discussed/Reviewed Safety Discussed, Safety Reviewed, Fall Risk, Home Safety  [Encouraged Consideration of Home Safety Evaluation]  Home Safety  Assistive Devices  [Encouraged Routine Use of Assistive Devices]  Advanced Directive Interventions   Advanced Directives Discussed/Reviewed Advanced Directives Discussed, Advanced Directives Reviewed  [Encouraged Initiation, Offering to [employer] & Assist with Completion]      Active Listening & Reflection Utilized.  Verbalization of Feelings Encouraged.  Emotional  Support Provided. Acceptance & Commitment Therapy Indicated. Cognitive Behavioral Therapy Initiated. Client-Centered Therapy Performed. Confirmed Once a Month Home Infusion for November Scheduled on 12/27/2022 from 12:00 PM - 7:00 PM, 12/28/2022 from 10:00 AM - 3:00 PM & 12/29/2022 from 6:00 PM - 10:00 PM, for Treatment of Rare Eye Disease & Autoimmune Disorder.  Encouraged Continued Engagement with [employer], Wellsite geologist of Interest in [place], from List Provided: ~ In-Home Care & Respite Agencies ~ Home Health Care Agencies ~ Respite Care Agencies & Facilities ~ Personal Care Service Agency Providers Encouraged Initiation of Advance Directives (Living Will & Healthcare Power of Attorney Documents) & Submission to Dr. Trena Platt, Primary Care Provider with [facility] [phone]# [phone]), to Scan Into Electronic Medical Record in Epic. Encouraged Routine Engagement with Danford Bad, Licensed Clinical Social Worker with [facility] [phone]), if You Have Questions, Need Assistance, or If Additional Social Work Needs Are Identified Between Now & Our Next Follow-Up Outreach Call, Scheduled on 01/20/2023 at 12:00 PM.        SDOH assessments and interventions completed:  Yes.  SDOH Interventions Today    Flowsheet Row Most Recent Value  SDOH Interventions   Food Insecurity Interventions Intervention Not Indicated  Housing Interventions Intervention Not Indicated  Transportation Interventions Intervention Not Indicated, Patient Resources  (Friends/Family)  Utilities Interventions Intervention Not Indicated  Alcohol Usage Interventions Intervention Not Indicated (Score <7)  Depression Interventions/Treatment  Medication, Counseling, Currently on Treatment  Financial Strain Interventions Intervention Not Indicated  Physical Activity Interventions Intervention Not Indicated  Stress Interventions Intervention Not Indicated, Offered [employer], Provide Counseling  Social Connections Interventions Intervention Not Indicated  Health Literacy Interventions Intervention Not Indicated     Care Coordination Interventions:  Yes, provided.   Follow up plan: Follow up call scheduled for 01/20/2023 at 12:00 pm.  Encounter Outcome:  Patient Visit Completed.   Danford Bad, BSW, MSW, Printmaker Social Work Case Set designer Health  [facility], Population Health Direct Dial: [phone]  Fax: [phone] Email: [email or web address]: [email or web address]

## 2022-12-29 NOTE — Patient Instructions (Signed)
Visit Information  Thank you for taking time to visit with me today. Please don't hesitate to contact me if I can be of assistance to you.   Following are the goals we discussed today:   Goals Addressed               This Visit's Progress     Receive Help in The Home. (pt-stated)   On track     Care Coordination Interventions:  Interventions Today    Flowsheet Row Most Recent Value  Chronic Disease   Chronic disease during today's visit Diabetes, Hypertension (HTN), Other  [Recurrent Falls, Recurrent Major Depression, Morbid Obesity, Anxiety Disorder, Bilateral Lower Extremity Edema, Nonalcoholic Steatohepatitis, Cirrhosis of Liver without Acites, Degenerative Arthritis of Spine, Arthritis of Shoulder, Gout]  General Interventions   General Interventions Discussed/Reviewed General Interventions Discussed, General Interventions Reviewed, Durable Medical Equipment (DME), Health Screening, Community Resources, Vaccines, Doctor Visits, Communication with, Level of Care  [Communication with Care Team Members]  Vaccines COVID-19, Flu, Pneumonia, RSV, Shingles, Tetanus/Pertussis/Diphtheria  [Encouraged]  Doctor Visits Discussed/Reviewed Doctor Visits Discussed, Specialist, Doctor Visits Reviewed, Annual Wellness Visits, PCP  [Encouraged]  Health Screening Bone Density, Colonoscopy, Mammogram  [Encouraged]  Durable Medical Equipment (DME) BP Cuff, Dan Humphreys, Other  [Cane & Prescription Eyeglasses]  PCP/Specialist Visits Compliance with follow-up visit  [Encouraged Routine Engagement]  Communication with PCP/Specialists, RN, Pharmacists, Social Work  [employer] Routine Engagement]  Level of Care Adult Daycare, Development worker, international aid, Air traffic controller, Assisted Living, Skilled Nursing Facility  [Confirmed Disinterest in Enrolling in Adult Day Care Program or Applying for Medicaid or Personal Care Services]  Applications Medicaid, Personal Care Services  [Confirmed Disinterest in Pursuing Higher Level  of Care Placement Options]  Exercise Interventions   Exercise Discussed/Reviewed Exercise Discussed, Assistive device use and maintanence, Exercise Reviewed, Physical Activity, Weight Managment  [Encouraged]  Physical Activity Discussed/Reviewed Physical Activity Discussed, Home Exercise Program (HEP), PREP, Gym, Physical Activity Reviewed, Types of exercise  [Encouraged]  Weight Management Weight loss  [Encouraged]  Education Interventions   Education Provided Provided Education  Provided Verbal Education On Nutrition, Mental Health/Coping with Illness, When to see the doctor, Blood Sugar Monitoring, Applications, Exercise, [employer], Development worker, community, Medication  [employer], Personal Care Services  [Confirmed Disinterest in Pursuing Higher Level of Care Placement Options]  Mental Health Interventions   Mental Health Discussed/Reviewed Mental Health Discussed, Anxiety, Depression, Grief and Loss, Mental Health Reviewed, Substance Abuse, Coping Strategies, Suicide, Crisis  [Assessed Mental Health Status]  Nutrition Interventions   Nutrition Discussed/Reviewed Nutrition Discussed, Adding fruits and vegetables, Nutrition Reviewed, Fluid intake, Carbohydrate meal planning, Portion sizes, Decreasing sugar intake, Increasing proteins, Decreasing fats, Decreasing salt  [Encouraged]  Pharmacy Interventions   Pharmacy Dicussed/Reviewed Pharmacy Topics Discussed, Medications and their functions, Pharmacy Topics Reviewed, Medication Adherence, Affording Medications  [Confirmed Ability to Afford Medications]  Medication Adherence --  [Confirmed Medication Compliance]  Safety Interventions   Safety Discussed/Reviewed Safety Discussed, Safety Reviewed, Fall Risk, Home Safety  [Encouraged Consideration of Home Safety Evaluation]  Home Safety Assistive Devices  [Encouraged Routine Use of Assistive Devices]  Advanced Directive Interventions   Advanced Directives Discussed/Reviewed Advanced  Directives Discussed, Advanced Directives Reviewed  [Encouraged Initiation, Offering to [employer] & Assist with Completion]      Active Listening & Reflection Utilized.  Verbalization of Feelings Encouraged.  Emotional Support Provided. Acceptance & Commitment Therapy Indicated. Cognitive Behavioral Therapy Initiated. Client-Centered Therapy Performed. Confirmed Once a Month Home Infusion for November Scheduled on 12/27/2022 from 12:00 PM - 7:00  PM, 12/28/2022 from 10:00 AM - 3:00 PM & 12/29/2022 from 6:00 PM - 10:00 PM, for Treatment of Rare Eye Disease & Autoimmune Disorder.  Encouraged Continued Engagement with [employer], Wellsite geologist of Interest in [place], from List Provided: ~ In-Home Care & Respite Agencies ~ Home Health Care Agencies ~ Respite Care Agencies & Facilities ~ Personal Care Service Agency Providers Encouraged Initiation of Advance Directives (Living Will & Healthcare Power of Attorney Documents) & Submission to Dr. Trena Platt, Primary Care Provider with [facility] [phone]# [phone]), to Scan Into Electronic Medical Record in Epic. Encouraged Routine Engagement with Danford Bad, Licensed Clinical Social Worker with [facility] [phone]), if You Have Questions, Need Assistance, or If Additional Social Work Needs Are Identified Between Now & Our Next Follow-Up Outreach Call, Scheduled on 01/20/2023 at 12:00 PM.      Our next appointment is by telephone on 01/20/2023 at 12:00 pm.  Please call the care guide team at [phone] if you need to cancel or reschedule your appointment.   If you are experiencing a Mental Health or Behavioral Health Crisis or need someone to talk to, please call the Suicide and Crisis Lifeline: 988 call the [place] National Suicide Prevention Lifeline: 818-217-1715 or TTY: 587-800-1182 TTY 408-586-2729) to talk to a trained counselor call 1-800-273-TALK (toll free, 24  hour hotline) go to [facility] [address] [place] [phone]) call the [facility] Crisis Line: [phone] call 911  Patient verbalizes understanding of instructions and care plan provided today and agrees to view in MyChart. Active MyChart status and patient understanding of how to access instructions and care plan via MyChart confirmed with patient.     Telephone follow up appointment with care management team member scheduled for:  01/20/2023 at 12:00 pm.  Danford Bad, BSW, MSW, LCSW  Embedded Practice Social Work Case Manager  [facility], Population Health Direct Dial: [phone]  Fax: [phone] Email: [email or web address] Website: [email or web address]

## 2023-01-03 ENCOUNTER — Ambulatory Visit (INDEPENDENT_AMBULATORY_CARE_PROVIDER_SITE_OTHER): Payer: Medicare Other

## 2023-01-03 ENCOUNTER — Ambulatory Visit (INDEPENDENT_AMBULATORY_CARE_PROVIDER_SITE_OTHER): Payer: Medicare Other | Admitting: Podiatry

## 2023-01-03 ENCOUNTER — Encounter: Payer: Self-pay | Admitting: Podiatry

## 2023-01-03 DIAGNOSIS — Z794 Long term (current) use of insulin: Secondary | ICD-10-CM

## 2023-01-03 DIAGNOSIS — M79674 Pain in right toe(s): Secondary | ICD-10-CM | POA: Diagnosis not present

## 2023-01-03 DIAGNOSIS — B351 Tinea unguium: Secondary | ICD-10-CM | POA: Diagnosis not present

## 2023-01-03 DIAGNOSIS — S92415A Nondisplaced fracture of proximal phalanx of left great toe, initial encounter for closed fracture: Secondary | ICD-10-CM

## 2023-01-03 DIAGNOSIS — M2041 Other hammer toe(s) (acquired), right foot: Secondary | ICD-10-CM

## 2023-01-03 DIAGNOSIS — M79675 Pain in left toe(s): Secondary | ICD-10-CM

## 2023-01-03 DIAGNOSIS — E1142 Type 2 diabetes mellitus with diabetic polyneuropathy: Secondary | ICD-10-CM | POA: Diagnosis not present

## 2023-01-03 NOTE — Progress Notes (Signed)
Subjective:  Patient ID: [name] [name], female    DOB: 10-09-1950,   MRN: [id number]  Chief Complaint  Patient presents with   Foot Pain    72 y.o. female presents for new concern of toe swelling and pain. Recently seen by PCP and was concern for possible fracture of the toe.  Relates initially had a fall over a year ago and left great toe started swelling. Relates did ok but had another fall three weeks ago and noticed swelling. Denies pain Relates burning and tingling in their feet. Patient is diabetic and last A1c was  Lab Results  Component Value Date   HGBA1C 6.0 08/17/2022   .   PCP:  Anabel Halon, MD    . Denies any other pedal complaints. Denies n/v/f/c.   Past Medical History:  Diagnosis Date   Abdominal wall contusion 12/09/2011   Achilles tendon contracture, bilateral 06/03/2016   Anxiety    Arthritis    Blood dyscrasia    low platelet count- sees [name] , Dr. Nelva Nay ( Note in EPIC from 07/2014) at [facility]   Breast cancer [facility]) 2017   Left Breast   Breast cancer (HCC) 07/2021   right breast IDC   Cancer (HCC)    left breast   Chest wall contusion 12/09/2011   Cirrhosis of liver (HCC)    Cough    Diabetes mellitus without complication (HCC)    Diabetic neuropathy (HCC)    Diarrhea due to drug 01/29/2021   Dyspnea    Gout    Gout    History of radiation therapy 10/22/15 - 12/08/15   left breast 50.4 Gy, boost to 10 Gy   History of radiation therapy    Right breast- 09/22/21-10/30/21- Dr. 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   Neuromuscular disorder (HCC)    diabetic neuropathy   Neuropathy    Personal history of radiation therapy 2017   Left Breast Cancer   Posterior tibial tendon dysfunction (PTTD) of right lower extremity 10/09/2019   Sleep apnea    CPAP nightly   Spleen enlarged    Thyroid disease     Objective:  Physical Exam: Vascular: DP/PT pulses 2/4  bilateral. CFT <3 seconds. Absent hair growth on digits. Edema noted to bilateral lower extremities. Xerosis noted bilaterally.  Skin. No lacerations or abrasions bilateral feet. Nails 1-5 bilateral  are thickened but not elongated today.  Musculoskeletal: MMT 5/5 bilateral lower extremities in DF, PF, Inversion and Eversion. Deceased ROM in DF of ankle joint. No major tenderness to palpation about the left hallux or elswhere about the feet.  Neurological: Sensation intact to light touch. Protective sensation diminished bilateral.    Assessment:   1. Type 2 diabetes mellitus with diabetic polyneuropathy, with long-term current use of insulin (HCC)   2. Closed nondisplaced fracture of proximal phalanx of left great toe, initial encounter   3. Hammertoes of both feet   4. Dermatophytosis of nail      Plan:  Patient was evaluated and treated and all questions answered. X-rays reviewed and discussed with patient. Left great toe age indeterminate proximal hallux phalanx fracture. Appears to be healing. . Retained hardware in ankle on right from previous TCC fusion. Hammered digits 2-5 bilateral. Hallux valgus noted on left -Xrays reviewed -Discussed treatement options for toe fracture; risks, alternatives, and benefits explained. -Discussed to appears to be healing well. Advised on ways to  reduce swelling.   -Recommend protection, rest, ice, elevation daily until symptoms improve -Rx pain med/antinflammatories as needed Discussed neuropathy contributing a lot to pain and Rullo consider different medication aside from gabapentin.  -Patient to return to office in 3 months for rfc.    Louann Sjogren, DPM

## 2023-01-06 ENCOUNTER — Telehealth: Payer: Self-pay

## 2023-01-06 NOTE — Telephone Encounter (Signed)
Copied from CRM [id number]. Topic: Clinical - Medical Advice >> Jan 06, 2023 10:10 AM Clayton Bibles wrote: Reason for CRM: Returning call - She had not information, message said to call office. She said a nurse called her.

## 2023-01-07 ENCOUNTER — Other Ambulatory Visit: Payer: Self-pay | Admitting: Internal Medicine

## 2023-01-18 DIAGNOSIS — H3581 Retinal edema: Secondary | ICD-10-CM | POA: Diagnosis not present

## 2023-01-18 DIAGNOSIS — H02053 Trichiasis without entropian right eye, unspecified eyelid: Secondary | ICD-10-CM | POA: Diagnosis not present

## 2023-01-18 DIAGNOSIS — L121 Cicatricial pemphigoid: Secondary | ICD-10-CM | POA: Diagnosis not present

## 2023-01-18 DIAGNOSIS — E119 Type 2 diabetes mellitus without complications: Secondary | ICD-10-CM | POA: Diagnosis not present

## 2023-01-18 DIAGNOSIS — H02056 Trichiasis without entropian left eye, unspecified eyelid: Secondary | ICD-10-CM | POA: Diagnosis not present

## 2023-01-18 DIAGNOSIS — Z961 Presence of intraocular lens: Secondary | ICD-10-CM | POA: Diagnosis not present

## 2023-01-18 DIAGNOSIS — Z79899 Other long term (current) drug therapy: Secondary | ICD-10-CM | POA: Diagnosis not present

## 2023-01-19 ENCOUNTER — Encounter (INDEPENDENT_AMBULATORY_CARE_PROVIDER_SITE_OTHER): Payer: Self-pay | Admitting: *Deleted

## 2023-01-20 ENCOUNTER — Encounter (INDEPENDENT_AMBULATORY_CARE_PROVIDER_SITE_OTHER): Payer: Self-pay | Admitting: Gastroenterology

## 2023-01-20 ENCOUNTER — Ambulatory Visit: Payer: Self-pay | Admitting: *Deleted

## 2023-01-20 NOTE — Patient Outreach (Signed)
Care Coordination   Follow Up Visit Note   01/20/2023  Name: Nalanie Sloma Nees MRN: [id number] DOB: 06/19/1950  Maceo Pro Gafford is a 72 y.o. year old female who sees Anabel Halon, MD for primary care. I spoke with Willaim Sheng A [place] by phone today.  What matters to the patients health and wellness today?  Receive Help in The Home.    Goals Addressed               This Visit's Progress     Receive Help in The Home. (pt-stated)   On track     Care Coordination Interventions:  Interventions Today    Flowsheet Row Most Recent Value  Chronic Disease   Chronic disease during today's visit Diabetes, Hypertension (HTN), Other  [Recurrent Falls, Recurrent Major Depression, Morbid Obesity, Anxiety Disorder, Bilateral Lower Extremity Edema, Nonalcoholic Steatohepatitis, Cirrhosis of Liver without Acites, Degenerative Arthritis of Spine, Arthritis of Shoulder, Gout]  General Interventions   General Interventions Discussed/Reviewed General Interventions Discussed, General Interventions Reviewed, Durable Medical Equipment (DME), Health Screening, Community Resources, Vaccines, Doctor Visits, Communication with, Level of Care  [Communication with Care Team Members]  Vaccines COVID-19, Flu, Pneumonia, RSV, Shingles, Tetanus/Pertussis/Diphtheria  [Encouraged]  Doctor Visits Discussed/Reviewed Doctor Visits Discussed, Specialist, Doctor Visits Reviewed, Annual Wellness Visits, PCP  [Encouraged]  Health Screening Bone Density, Colonoscopy, Mammogram  [Encouraged]  Durable Medical Equipment (DME) BP Cuff, Dan Humphreys, Other  [Cane & Prescription Eyeglasses]  PCP/Specialist Visits Compliance with follow-up visit  [Encouraged Routine Engagement]  Communication with PCP/Specialists, RN, Pharmacists, Social Work  [employer] Routine Engagement]  Level of Care Adult Daycare, Development worker, international aid, Air traffic controller, Assisted Living, Skilled Nursing Facility  [Confirmed Disinterest in Enrolling in Adult Day Care Program or  Applying for Medicaid or Personal Care Services]  Applications Medicaid, Personal Care Services  [Confirmed Disinterest in Pursuing Higher Level of Care Placement Options]  Exercise Interventions   Exercise Discussed/Reviewed Exercise Discussed, Assistive device use and maintanence, Exercise Reviewed, Physical Activity, Weight Managment  [Encouraged]  Physical Activity Discussed/Reviewed Physical Activity Discussed, Home Exercise Program (HEP), PREP, Gym, Physical Activity Reviewed, Types of exercise  [Encouraged]  Weight Management Weight loss  [Encouraged]  Education Interventions   Education Provided Provided Education  Provided Verbal Education On Nutrition, Mental Health/Coping with Illness, When to see the doctor, Blood Sugar Monitoring, Applications, Exercise, [employer], Development worker, community, Medication  [employer], Personal Care Services  [Confirmed Disinterest in Pursuing Higher Level of Care Placement Options]  Mental Health Interventions   Mental Health Discussed/Reviewed Mental Health Discussed, Anxiety, Depression, Grief and Loss, Mental Health Reviewed, Substance Abuse, Coping Strategies, Suicide, Crisis  [Assessed Mental Health Status]  Nutrition Interventions   Nutrition Discussed/Reviewed Nutrition Discussed, Adding fruits and vegetables, Nutrition Reviewed, Fluid intake, Carbohydrate meal planning, Portion sizes, Decreasing sugar intake, Increasing proteins, Decreasing fats, Decreasing salt  [Encouraged]  Pharmacy Interventions   Pharmacy Dicussed/Reviewed Pharmacy Topics Discussed, Medications and their functions, Pharmacy Topics Reviewed, Medication Adherence, Affording Medications  [Confirmed Ability to Afford Medications]  Medication Adherence --  [Confirmed Medication Compliance]  Safety Interventions   Safety Discussed/Reviewed Safety Discussed, Safety Reviewed, Fall Risk, Home Safety  [Encouraged Consideration of Home Safety Evaluation]  Home Safety  Assistive Devices  [Encouraged Routine Use of Assistive Devices]  Advanced Directive Interventions   Advanced Directives Discussed/Reviewed Advanced Directives Discussed, Advanced Directives Reviewed  [Encouraged Initiation, Offering to [employer] & Assist with Completion]      Active Listening & Reflection Utilized.  Verbalization of Feelings Encouraged.  Emotional  Support Provided. Cognitive Behavioral Therapy Initiated. Acceptance & Commitment Therapy Indicated. Client-Centered Therapy Performed. Encouraged Routine Engagement with Danford Bad, Licensed Clinical Social Worker with [facility] [phone]), if You Have Questions, Need Assistance, or If Additional Social Work Needs Are Identified Between Now & Our Next Follow-Up Outreach Call, Scheduled on 02/16/2023 at 10:45 AM.      SDOH assessments and interventions completed:  Yes.  Care Coordination Interventions:  Yes, provided.   Follow up plan: Follow up call scheduled for 02/16/2023 at 10:45 am.  Encounter Outcome:  Patient Visit Completed.   Danford Bad, BSW, MSW, Printmaker Social Work Case Set designer Health  [facility], Population Health Direct Dial: [phone]  Fax: [phone] Email: [email or web address]: [email or web address]

## 2023-01-20 NOTE — Patient Instructions (Signed)
Visit Information  Thank you for taking time to visit with me today. Please don't hesitate to contact me if I can be of assistance to you.   Following are the goals we discussed today:   Goals Addressed               This Visit's Progress     Receive Help in The Home. (pt-stated)   On track     Care Coordination Interventions:  Interventions Today    Flowsheet Row Most Recent Value  Chronic Disease   Chronic disease during today's visit Diabetes, Hypertension (HTN), Other  [Recurrent Falls, Recurrent Major Depression, Morbid Obesity, Anxiety Disorder, Bilateral Lower Extremity Edema, Nonalcoholic Steatohepatitis, Cirrhosis of Liver without Acites, Degenerative Arthritis of Spine, Arthritis of Shoulder, Gout]  General Interventions   General Interventions Discussed/Reviewed General Interventions Discussed, General Interventions Reviewed, Durable Medical Equipment (DME), Health Screening, Community Resources, Vaccines, Doctor Visits, Communication with, Level of Care  [Communication with Care Team Members]  Vaccines COVID-19, Flu, Pneumonia, RSV, Shingles, Tetanus/Pertussis/Diphtheria  [Encouraged]  Doctor Visits Discussed/Reviewed Doctor Visits Discussed, Specialist, Doctor Visits Reviewed, Annual Wellness Visits, PCP  [Encouraged]  Health Screening Bone Density, Colonoscopy, Mammogram  [Encouraged]  Durable Medical Equipment (DME) BP Cuff, Dan Humphreys, Other  [Cane & Prescription Eyeglasses]  PCP/Specialist Visits Compliance with follow-up visit  [Encouraged Routine Engagement]  Communication with PCP/Specialists, RN, Pharmacists, Social Work  [employer] Routine Engagement]  Level of Care Adult Daycare, Development worker, international aid, Air traffic controller, Assisted Living, Skilled Nursing Facility  [Confirmed Disinterest in Enrolling in Adult Day Care Program or Applying for Medicaid or Personal Care Services]  Applications Medicaid, Personal Care Services  [Confirmed Disinterest in Pursuing Higher Level  of Care Placement Options]  Exercise Interventions   Exercise Discussed/Reviewed Exercise Discussed, Assistive device use and maintanence, Exercise Reviewed, Physical Activity, Weight Managment  [Encouraged]  Physical Activity Discussed/Reviewed Physical Activity Discussed, Home Exercise Program (HEP), PREP, Gym, Physical Activity Reviewed, Types of exercise  [Encouraged]  Weight Management Weight loss  [Encouraged]  Education Interventions   Education Provided Provided Education  Provided Verbal Education On Nutrition, Mental Health/Coping with Illness, When to see the doctor, Blood Sugar Monitoring, Applications, Exercise, [employer], Development worker, community, Medication  [employer], Personal Care Services  [Confirmed Disinterest in Pursuing Higher Level of Care Placement Options]  Mental Health Interventions   Mental Health Discussed/Reviewed Mental Health Discussed, Anxiety, Depression, Grief and Loss, Mental Health Reviewed, Substance Abuse, Coping Strategies, Suicide, Crisis  [Assessed Mental Health Status]  Nutrition Interventions   Nutrition Discussed/Reviewed Nutrition Discussed, Adding fruits and vegetables, Nutrition Reviewed, Fluid intake, Carbohydrate meal planning, Portion sizes, Decreasing sugar intake, Increasing proteins, Decreasing fats, Decreasing salt  [Encouraged]  Pharmacy Interventions   Pharmacy Dicussed/Reviewed Pharmacy Topics Discussed, Medications and their functions, Pharmacy Topics Reviewed, Medication Adherence, Affording Medications  [Confirmed Ability to Afford Medications]  Medication Adherence --  [Confirmed Medication Compliance]  Safety Interventions   Safety Discussed/Reviewed Safety Discussed, Safety Reviewed, Fall Risk, Home Safety  [Encouraged Consideration of Home Safety Evaluation]  Home Safety Assistive Devices  [Encouraged Routine Use of Assistive Devices]  Advanced Directive Interventions   Advanced Directives Discussed/Reviewed Advanced  Directives Discussed, Advanced Directives Reviewed  [Encouraged Initiation, Offering to [employer] & Assist with Completion]      Active Listening & Reflection Utilized.  Verbalization of Feelings Encouraged.  Emotional Support Provided. Cognitive Behavioral Therapy Initiated. Acceptance & Commitment Therapy Indicated. Client-Centered Therapy Performed. Encouraged Routine Engagement with Danford Bad, Licensed Clinical Social Worker with [facility] [phone]),  if You Have Questions, Need Assistance, or If Additional Social Work Needs Are Identified Between Now & Our Next Follow-Up Outreach Call, Scheduled on 02/16/2023 at 10:45 AM.      Our next appointment is by telephone on 02/16/2023 at 10:45 am.  Please call the care guide team at [phone] if you need to cancel or reschedule your appointment.   If you are experiencing a Mental Health or Behavioral Health Crisis or need someone to talk to, please call the Suicide and Crisis Lifeline: 988 call the [place] National Suicide Prevention Lifeline: (226)649-5599 or TTY: (340)257-1593 TTY 316-706-1349) to talk to a trained counselor call 1-800-273-TALK (toll free, 24 hour hotline) go to [facility] [address], [place] [phone]) call the [facility] Crisis Line: [phone] call 911  Patient verbalizes understanding of instructions and care plan provided today and agrees to view in MyChart. Active MyChart status and patient understanding of how to access instructions and care plan via MyChart confirmed with patient.     Telephone follow up appointment with care management team member scheduled for:   02/16/2023 at 10:45 am.  Danford Bad, BSW, MSW, LCSW  Embedded Practice Social Work Case Manager  [facility], Population Health Direct Dial: [phone]  Fax: [phone] Email: [email or web address] Website:  [email or web address]

## 2023-01-31 ENCOUNTER — Other Ambulatory Visit: Payer: Self-pay

## 2023-02-10 ENCOUNTER — Ambulatory Visit (INDEPENDENT_AMBULATORY_CARE_PROVIDER_SITE_OTHER): Payer: Medicare Other | Admitting: Gastroenterology

## 2023-02-14 ENCOUNTER — Other Ambulatory Visit: Payer: Self-pay | Admitting: Internal Medicine

## 2023-02-16 ENCOUNTER — Ambulatory Visit: Payer: Self-pay | Admitting: *Deleted

## 2023-02-16 NOTE — Patient Instructions (Signed)
 Visit Information  Thank you for taking time to visit with me today. Please don't hesitate to contact me if I can be of assistance to you.   Following are the goals we discussed today:   Goals Addressed               This Visit's Progress     Receive Help in The Home. (pt-stated)   On track     Care Coordination Interventions:   Interventions Today    Flowsheet Row Most Recent Value  Chronic Disease   Chronic disease during today's visit Diabetes, Hypertension (HTN), Other  [Recurrent Falls, Recurrent Major Depression, Morbid Obesity, Anxiety Disorder, Bilateral Lower Extremity Edema, Nonalcoholic Steatohepatitis, Cirrhosis of Liver without Acites, Degenerative Arthritis of Spine, Arthritis of Shoulder, Gout, Polyneuropathy.]  General Interventions   General Interventions Discussed/Reviewed General Interventions Discussed, Labs, Vaccines, Health Screening, Annual Foot Exam, Doctor Visits, Lipid Profile, General Interventions Reviewed, Annual Eye Exam, Durable Medical Equipment (DME), Community Resources, Level of Care, Communication with  [Encouraged Routine Engagement with Care Team Members & Providers.]  Labs Hgb A1c every 3 months, Kidney Function, Hgb A1c annually  [Encouraged Routine Labwork.]  Vaccines COVID-19, Flu, Pneumonia, RSV, Shingles, Tetanus/Pertussis/Diphtheria  [Encouraged Routine Vaccinations.]  Doctor Visits Discussed/Reviewed Doctor Visits Discussed, Specialist, Doctor Visits Reviewed, Annual Wellness Visits, PCP  [Encouraged Routine Engagement with Care Team Members & Providers.]  Health Screening Bone Density, Colonoscopy, Mammogram  [Encouraged Routine Health Screenings.]  Durable Medical Equipment (DME) BP Cuff, Other, Reginald Capri, Prescription Eyeglasses.]  PCP/Specialist Visits Compliance with follow-up visit  [Encouraged Routine Engagement with Care Team Members & Providers.]  Communication with PCP/Specialists, RN, Pharmacists, Social Work  [employer]  Routine Engagement with Care Team Members & Providers.]  Level of Care Adult Daycare, Air traffic controller, Assisted Living, Skilled Nursing Facility  [Confirmed Disinterest in Enrollment in Adult Day Care Program. Confirmed Disinterest in Receiving Assistance Pursuing Higher Level of Care Placement Options (I.e Assisted Living Versus Skilled Nursing Facility).]  Applications Medicaid, Personal Care Services  [Confirmed Disinterest in Applying for Medicaid or Personal Care Services.]  Exercise Interventions   Exercise Discussed/Reviewed Exercise Discussed, Assistive device use and maintanence, Exercise Reviewed, Physical Activity, Weight Managment  [Encouraged Daily Exercise Regimen, as Tolerated.]  Physical Activity Discussed/Reviewed Physical Activity Discussed, Home Exercise Program (HEP), Physical Activity Reviewed, PREP, Gym, Types of exercise  [Encouraged Increased Level of Activity & Exercise, Inside & Outside the Home.]  Weight Management Weight loss  [Encouraged Healthy Weight Loss Program.]  Education Interventions   Education Provided Provided Education  [Thoroughly Reviewed Educational Material to Entertain Questions & Ensure Understanding.]  Provided Verbal Education On Nutrition, Mental Health/Coping with Illness, When to see the doctor, Sick Day Rules, [employer], Development worker, community, Medication, Blood Sugar Monitoring, Exercise, Applications, Labs, Eye Care, Foot Care  [Encouraged Continued Independent Review of Educational Material Provided.]  Labs Reviewed Hgb A1c  [Reviewed & Encouraged Routine Logging Results.]  Ship broker, Personal Care Services  [Confirmed Disinterest in Applying for Medicaid or Personal Care Services.]  Mental Health Interventions   Mental Health Discussed/Reviewed Mental Health Discussed, Anxiety, Depression, Mental Health Reviewed, Grief and Loss, Substance Abuse, Coping Strategies, Suicide, Crisis, Other  [Assessed Mental Health & Cognitive Status.]   Nutrition Interventions   Nutrition Discussed/Reviewed Nutrition Discussed, Nutrition Reviewed, Carbohydrate meal planning, Decreasing sugar intake, Portion sizes, Fluid intake, Adding fruits and vegetables, Increasing proteins, Decreasing fats, Decreasing salt  [Encouraged Heart-Healthy, Diabetic-Friendly, Low Sodium, Reduced Fat Diet.]  Pharmacy Interventions   Pharmacy Dicussed/Reviewed Pharmacy Topics  Discussed, Medications and their functions, Medication Adherence, Pharmacy Topics Reviewed, Affording Medications  [Confirmed Ability to Afford Prescription Medications.]  Medication Adherence --  [Confirmed Prescription Medication Compliance.]  Safety Interventions   Safety Discussed/Reviewed Safety Discussed, Safety Reviewed  [Encouraged Routine Use of Assistive Devices & Durable Medical Equipment.]  Journalist, newspaper, Refer for community resources, Need for home safety assessment  [Encouraged Consideration of Home Safety Evaluation.]  Advanced Directive Interventions   Advanced Directives Discussed/Reviewed Advanced Directives Discussed, Advanced Directives Reviewed  [Encouraged Initiation of Advanced Directives (Living Will & Healthcare Power of Corporate treasurer), Offering to [employer], Assist with Completion, Make Copies & Scan into Electronic Medical Record in Epic.]      Active Listening & Reflection Utilized.  Verbalization of Feelings Encouraged.  Emotional Support Provided. Cognitive Behavioral Therapy Initiated. Acceptance & Commitment Therapy Indicated. Client-Centered Therapy Performed. Encouraged Routine Engagement with Timmie Footman, Licensed Clinical Social Worker with [facility] [phone]), if You Have Questions, Need Assistance, or If Additional Social Work Needs Are Identified Between Now & Our Next Follow-Up Outreach Call, Scheduled on 03/16/2023 at 9:00 AM.      Our next appointment is by telephone on 03/16/2023 at 9:00  am.  Please call the care guide team at [phone] if you need to cancel or reschedule your appointment.   If you are experiencing a Mental Health or Behavioral Health Crisis or need someone to talk to, please call the Suicide and Crisis Lifeline: 988 call the [place]  National Suicide Prevention Lifeline: 402-252-7248 or TTY: 708-585-2634 TTY 210-639-4349) to talk to a trained counselor call 1-800-273-TALK (toll free, 24 hour hotline) go to [facility] [address], [place] [phone]) call the [facility] Crisis Line: 917-066-4784 call 911  Patient verbalizes understanding of instructions and care plan provided today and agrees to view in MyChart. Active MyChart status and patient understanding of how to access instructions and care plan via MyChart confirmed with patient.     Telephone follow up appointment with care management team member scheduled for:  03/16/2023 at 9:00 am.  Timmie Footman, BSW, MSW, LCSW   [place]  [facility], [facility]  Clinical Social Worker II  Direct Dial: [phone]  Fax: [phone]  Mailing Address: [facility] [address]  [place]  [address]  Website: [email or web address]

## 2023-02-16 NOTE — Patient Outreach (Signed)
 Care Coordination   Follow Up Visit Note   02/16/2023  Name: [name] [name] MRN: [id number] DOB: December 11, 1950  [name] [name] is a 73 y.o. year old female who sees [name] Sport, [name] [name] for primary care. I spoke with [name] [name] by phone today.  What matters to the patients health and wellness today?  Receive Help in The Home.   Goals Addressed               This Visit's Progress     Receive Help in The Home. (pt-stated)   On track     Care Coordination Interventions:   Interventions Today    Flowsheet Row Most Recent Value  Chronic Disease   Chronic disease during today's visit Diabetes, Hypertension (HTN), Other  [Recurrent Falls, Recurrent Major Depression, Morbid Obesity, Anxiety Disorder, Bilateral Lower Extremity Edema, Nonalcoholic Steatohepatitis, Cirrhosis of Liver without Acites, Degenerative Arthritis of Spine, Arthritis of Shoulder, Gout, Polyneuropathy.]  General Interventions   General Interventions Discussed/Reviewed General Interventions Discussed, Labs, Vaccines, Health Screening, Annual Foot Exam, Doctor Visits, Lipid Profile, General Interventions Reviewed, Annual Eye Exam, Durable Medical Equipment (DME), Community Resources, Level of Care, Communication with  [Encouraged Routine Engagement with Care Team Members & Providers.]  Labs Hgb A1c every 3 months, Kidney Function, Hgb A1c annually  [Encouraged Routine Labwork.]  Vaccines COVID-19, Flu, Pneumonia, RSV, Shingles, Tetanus/Pertussis/Diphtheria  [Encouraged Routine Vaccinations.]  Doctor Visits Discussed/Reviewed Doctor Visits Discussed, Specialist, Doctor Visits Reviewed, Annual Wellness Visits, PCP  [Encouraged Routine Engagement with Care Team Members & Providers.]  Health Screening Bone Density, Colonoscopy, Mammogram  [Encouraged Routine Health Screenings.]  Durable Medical Equipment (DME) BP Cuff, Other, [name] [name], Prescription Eyeglasses.]  PCP/Specialist Visits Compliance with follow-up visit   [Encouraged Routine Engagement with Care Team Members & Providers.]  Communication with PCP/Specialists, RN, Pharmacists, Social Work  [employer] Routine Engagement with Care Team Members & Providers.]  Level of Care Adult Daycare, Air traffic controller, Assisted Living, Skilled Nursing Facility  [Confirmed Disinterest in Enrollment in Adult Day Care Program. Confirmed Disinterest in Receiving Assistance Pursuing Higher Level of Care Placement Options (I.e Assisted Living Versus Skilled Nursing Facility).]  Applications Medicaid, Personal Care Services  [Confirmed Disinterest in Applying for Medicaid or Personal Care Services.]  Exercise Interventions   Exercise Discussed/Reviewed Exercise Discussed, Assistive device use and maintanence, Exercise Reviewed, Physical Activity, Weight Managment  [Encouraged Daily Exercise Regimen, as Tolerated.]  Physical Activity Discussed/Reviewed Physical Activity Discussed, Home Exercise Program (HEP), Physical Activity Reviewed, PREP, Gym, Types of exercise  [Encouraged Increased Level of Activity & Exercise, Inside & Outside the Home.]  Weight Management Weight loss  [Encouraged Healthy Weight Loss Program.]  Education Interventions   Education Provided Provided Education  [Thoroughly Reviewed Educational Material to Entertain Questions & Ensure Understanding.]  Provided Verbal Education On Nutrition, Mental Health/Coping with Illness, When to see the doctor, Sick Day Rules, [employer], [employer], Medication, Blood Sugar Monitoring, Exercise, Applications, Labs, Eye Care, Foot Care  [Encouraged Continued Independent Review of Educational Material Provided.]  Labs Reviewed Hgb A1c  [Reviewed & Encouraged Routine Logging Results.]  Ship broker, Personal Care Services  [Confirmed Disinterest in Applying for Medicaid or Personal Care Services.]  Mental Health Interventions   Mental Health Discussed/Reviewed Mental Health Discussed, Anxiety, Depression,  Mental Health Reviewed, Grief and Loss, Substance Abuse, Coping Strategies, Suicide, Crisis, Other  [Assessed Mental Health & Cognitive Status.]  Nutrition Interventions   Nutrition Discussed/Reviewed Nutrition Discussed, Nutrition Reviewed, Carbohydrate meal planning, Decreasing sugar intake, Portion sizes, Fluid  intake, Adding fruits and vegetables, Increasing proteins, Decreasing fats, Decreasing salt  [Encouraged Heart-Healthy, Diabetic-Friendly, Low Sodium, Reduced Fat Diet.]  Pharmacy Interventions   Pharmacy Dicussed/Reviewed Pharmacy Topics Discussed, Medications and their functions, Medication Adherence, Pharmacy Topics Reviewed, Affording Medications  [Confirmed Ability to Afford Prescription Medications.]  Medication Adherence --  [Confirmed Prescription Medication Compliance.]  Safety Interventions   Safety Discussed/Reviewed Safety Discussed, Safety Reviewed  [Encouraged Routine Use of Assistive Devices & Durable Medical Equipment.]  Journalist, newspaper, Refer for community resources, Need for home safety assessment  [Encouraged Consideration of Home Safety Evaluation.]  Advanced Directive Interventions   Advanced Directives Discussed/Reviewed Advanced Directives Discussed, Advanced Directives Reviewed  [Encouraged Initiation of Advanced Directives (Living Will & Healthcare Power of Corporate treasurer), Offering to [employer], Assist with Completion, Make Copies & Scan into Electronic Medical Record in Epic.]      Active Listening & Reflection Utilized.  Verbalization of Feelings Encouraged.  Emotional Support Provided. Cognitive Behavioral Therapy Initiated. Acceptance & Commitment Therapy Indicated. Client-Centered Therapy Performed. Encouraged Routine Engagement with Monte Zinni, Licensed Clinical Social Worker with [facility] [phone]), if You Have Questions, Need Assistance, or If Additional Social Work Needs Are Identified Between Now & Our  Next Follow-Up Outreach Call, Scheduled on 03/16/2023 at 9:00 AM.      SDOH assessments and interventions completed:  Yes.  Care Coordination Interventions:  Yes, provided.   Follow up plan: Follow up call scheduled for 03/16/2023 at 9:00 am.  Encounter Outcome:  Patient Visit Completed.   Timmie Footman, BSW, MSW, LCSW   [facility], [facility]  Clinical Social Worker II  Direct Dial: [phone]  Fax: [phone]  Mailing Address: [facility] [address]  [place]  [address]  Website: [email or web address]

## 2023-03-07 ENCOUNTER — Ambulatory Visit (INDEPENDENT_AMBULATORY_CARE_PROVIDER_SITE_OTHER): Payer: Medicare Other | Admitting: Gastroenterology

## 2023-03-14 ENCOUNTER — Encounter (INDEPENDENT_AMBULATORY_CARE_PROVIDER_SITE_OTHER): Payer: Medicare Other | Admitting: Gastroenterology

## 2023-03-16 ENCOUNTER — Ambulatory Visit: Payer: Self-pay | Admitting: *Deleted

## 2023-03-16 ENCOUNTER — Encounter: Payer: Self-pay | Admitting: *Deleted

## 2023-03-16 NOTE — Patient Outreach (Signed)
Care Coordination   Follow Up Visit Note   03/16/2023  Name: [name] [name] MRN: [id number] DOB: 1950/04/12  [name] [name] is a 73 y.o. year old female who sees Anabel Halon, MD for primary care. I spoke with Willaim Sheng A Cygan by phone today.  What matters to the patients health and wellness today?  Receive Help in The Home.    Goals Addressed               This Visit's Progress     Receive Help in The Home. (pt-stated)   On track     Care Coordination Interventions:  Interventions Today    Flowsheet Row Most Recent Value  Chronic Disease   Chronic disease during today's visit Diabetes, Hypertension (HTN), Other  [Recurrent Falls, Recurrent Major Depression, Morbid Obesity, Anxiety Disorder, Bilateral Lower Extremity Edema, Nonalcoholic Steatohepatitis, Cirrhosis of Liver without Acites, Degenerative Arthritis of Spine, Arthritis of Shoulder, Gout, Polyneuropathy.]  General Interventions   General Interventions Discussed/Reviewed General Interventions Discussed, General Interventions Reviewed, Doctor Visits, Communication with, Community Resources  [Encouraged Routine Engagement with Care Team Members & Providers.]  Doctor Visits Discussed/Reviewed Doctor Visits Discussed, Specialist, Doctor Visits Reviewed, Annual Wellness Visits, PCP  [Encouraged Routine Engagement with Care Team Members & Providers.]  PCP/Specialist Visits Compliance with follow-up visit  [Encouraged Routine Engagement with Care Team Members & Providers.]  Communication with PCP/Specialists, Charity fundraiser, Pharmacists, Social Work  [employer] Routine Engagement with Care Team Members & Providers.]  Level of Care Adult Daycare, Assisted Living, Skilled Nursing Facility  [Confirmed Disinterest in Enrollment in Adult Day Care Program. Confirmed Disinterest in Receiving Assistance Pursuing Higher Level of Care Placement Options (I.e Assisted Living Versus Skilled Nursing Facility).]  Applications Medicaid, Personal Care Services   [Confirmed Disinterest in Applying for Medicaid or Personal Care Services.]  Education Interventions   Education Provided Provided Education  [Thoroughly Reviewed Educational Material to Entertain Questions & Ensure Understanding.]  Provided Verbal Education On Mental Health/Coping with Illness, When to see the doctor, Community Resources  [Encouraged Continued Independent Review of Educational Material Provided.]  Ship broker, Personal Care Services  [Confirmed Disinterest in Applying for [employer] or Personal Care Services.]  Mental Health Interventions   Mental Health Discussed/Reviewed Mental Health Discussed, Anxiety, Depression, Grief and Loss, Mental Health Reviewed, Substance Abuse, Coping Strategies, Suicide, Crisis, Other  [Assessed Mental Health & Cognitive Status.]  Safety Interventions   Safety Discussed/Reviewed Safety Discussed, Safety Reviewed  [Encouraged Routine Use of Assistive Devices & Durable Medical Equipment.]  Home Safety Assistive Devices  [Encouraged Consideration of Home Safety Evaluation.]  Advanced Directive Interventions   Advanced Directives Discussed/Reviewed Advanced Directives Discussed, Advanced Directives Reviewed  [Encouraged Initiation of Advanced Directives (Living Will & Healthcare Power of Corporate treasurer), Offering to [employer], Assist with Completion, Make Copies & Scan into Electronic Medical Record in Epic.]      Active Listening & Reflection Utilized.  Verbalization of Feelings Encouraged.  Emotional Support Provided. Cognitive Behavioral Therapy Initiated. Acceptance & Commitment Therapy Indicated. Client-Centered Therapy Performed. CSW Collaboration with Dr. Trena Platt, Primary Care Provider with [facility] [phone]), Via Secure Chat Message & Routed Note in Epic, to Confirm Knowledge of The Following Diagnosis & Associated Symptoms to Address During Next Follow-Up Appointment, Scheduled on  03/30/2023 at 9:40 AM: ~ Ocular Cicatricial Pemphigoid. ~ Retinal Edema.  ~ Trichiasis of Both Eyelids.   ~ Pseudophakia of Both Eyes. ~ Frequent & Uncontrollable Nose Bleeds. ~ Frequent Sinus Infections. ~ Frequent Mouth  Sores & Blisters. Encouraged Routine Engagement with Danford Bad, Licensed Clinical Social Worker with [facility], [facility] [phone]), if You Have Questions, Need Assistance, or If Additional Social Work Needs Are Identified Between Now & Our Next Follow-Up Outreach Call, Scheduled on 04/01/2023 at 11:15 AM.        SDOH assessments and interventions completed:  Yes.  SDOH Interventions Today    Flowsheet Row Most Recent Value  SDOH Interventions   Food Insecurity Interventions Intervention Not Indicated  Housing Interventions Intervention Not Indicated  Transportation Interventions Intervention Not Indicated, Patient Resources (Friends/Family)  Utilities Interventions Intervention Not Indicated  Alcohol Usage Interventions Intervention Not Indicated (Score <7)  Depression Interventions/Treatment  Medication, Counseling, Currently on Treatment, Community Resources Provided  Financial Strain Interventions Intervention Not Indicated  Physical Activity Interventions Intervention Not Indicated  Stress Interventions Community Resources Provided, Provide Counseling, Offered Community Wellness Resources  Social Connections Interventions Intervention Not Indicated  Health Literacy Interventions Intervention Not Indicated     Care Coordination Interventions:  Yes, provided.   Follow up plan: Follow up call scheduled for 04/01/2023 at 11:15 am.  Encounter Outcome:  Patient Visit Completed.   Danford Bad, BSW, MSW, LCSW [facility], [facility] Clinical Social Worker II Direct Dial: [phone]  Fax: [phone] Website: [email or web address]

## 2023-03-16 NOTE — Patient Instructions (Signed)
Visit Information  Thank you for taking time to visit with me today. Please don't hesitate to contact me if I can be of assistance to you.   Following are the goals we discussed today:   Goals Addressed               This Visit's Progress     Receive Help in The Home. (pt-stated)   On track     Care Coordination Interventions:  Interventions Today    Flowsheet Row Most Recent Value  Chronic Disease   Chronic disease during today's visit Diabetes, Hypertension (HTN), Other  [Recurrent Falls, Recurrent Major Depression, Morbid Obesity, Anxiety Disorder, Bilateral Lower Extremity Edema, Nonalcoholic Steatohepatitis, Cirrhosis of Liver without Acites, Degenerative Arthritis of Spine, Arthritis of Shoulder, Gout, Polyneuropathy.]  General Interventions   General Interventions Discussed/Reviewed General Interventions Discussed, General Interventions Reviewed, Doctor Visits, Communication with, Community Resources  [Encouraged Routine Engagement with Care Team Members & Providers.]  Doctor Visits Discussed/Reviewed Doctor Visits Discussed, Specialist, Doctor Visits Reviewed, Annual Wellness Visits, PCP  [Encouraged Routine Engagement with Care Team Members & Providers.]  PCP/Specialist Visits Compliance with follow-up visit  [Encouraged Routine Engagement with Care Team Members & Providers.]  Communication with PCP/Specialists, Charity fundraiser, Pharmacists, Social Work  [employer] Routine Engagement with Care Team Members & Providers.]  Level of Care Adult Daycare, Assisted Living, Skilled Nursing Facility  [Confirmed Disinterest in Enrollment in Adult Day Care Program. Confirmed Disinterest in Receiving Assistance Pursuing Higher Level of Care Placement Options (I.e Assisted Living Versus Skilled Nursing Facility).]  Applications Medicaid, Personal Care Services  [Confirmed Disinterest in Applying for Medicaid or Personal Care Services.]  Education Interventions   Education Provided Provided Education   [Thoroughly Reviewed Educational Material to Entertain Questions & Ensure Understanding.]  Provided Verbal Education On Mental Health/Coping with Illness, When to see the doctor, Community Resources  [Encouraged Continued Independent Review of Educational Material Provided.]  Ship broker, Personal Care Services  [Confirmed Disinterest in Applying for [employer] or Personal Care Services.]  Mental Health Interventions   Mental Health Discussed/Reviewed Mental Health Discussed, Anxiety, Depression, Grief and Loss, Mental Health Reviewed, Substance Abuse, Coping Strategies, Suicide, Crisis, Other  [Assessed Mental Health & Cognitive Status.]  Safety Interventions   Safety Discussed/Reviewed Safety Discussed, Safety Reviewed  [Encouraged Routine Use of Assistive Devices & Durable Medical Equipment.]  Home Safety Assistive Devices  [Encouraged Consideration of Home Safety Evaluation.]  Advanced Directive Interventions   Advanced Directives Discussed/Reviewed Advanced Directives Discussed, Advanced Directives Reviewed  [Encouraged Initiation of Advanced Directives (Living Will & Healthcare Power of Corporate treasurer), Offering to [employer], Assist with Completion, Make Copies & Scan into Electronic Medical Record in Epic.]      Active Listening & Reflection Utilized.  Verbalization of Feelings Encouraged.  Emotional Support Provided. Cognitive Behavioral Therapy Initiated. Acceptance & Commitment Therapy Indicated. Client-Centered Therapy Performed. CSW Collaboration with Dr. Trena Platt, Primary Care Provider with [facility] [phone]), Via Secure Chat Message & Routed Note in Epic, to Confirm Knowledge of The Following Diagnosis & Associated Symptoms to Address During Next Follow-Up Appointment, Scheduled on 03/30/2023 at 9:40 AM: ~ Ocular Cicatricial Pemphigoid. ~ Retinal Edema.  ~ Trichiasis of Both Eyelids.   ~ Pseudophakia of Both Eyes. ~ Frequent &  Uncontrollable Nose Bleeds. ~ Frequent Sinus Infections. ~ Frequent Mouth Sores & Blisters. Encouraged Routine Engagement with Danford Bad, Licensed Clinical Social Worker with [facility], [facility] [phone]), if You Have Questions, Need Assistance, or  If Additional Social Work Needs Are Identified Between Now & Our Next Follow-Up Outreach Call, Scheduled on 04/01/2023 at 11:15 AM.      Our next appointment is by telephone on 04/01/2023 at 11:15 am.  Please call the care guide team at [phone] if you need to cancel or reschedule your appointment.   If you are experiencing a Mental Health or Behavioral Health Crisis or need someone to talk to, please call the Suicide and Crisis Lifeline: 988 call the [place] National Suicide Prevention Lifeline: 727-172-0113 or TTY: (629)542-9271 TTY 3347762360) to talk to a trained counselor call 1-800-273-TALK (toll free, 24 hour hotline) go to [facility] [address] [place] [phone]) call the [facility] Crisis Line: [phone] call 911  Patient verbalizes understanding of instructions and care plan provided today and agrees to view in MyChart. Active MyChart status and patient understanding of how to access instructions and care plan via MyChart confirmed with patient.     Telephone follow up appointment with care management team member scheduled for:   04/01/2023 at 11:15 am.  Danford Bad, BSW, MSW, LCSW [facility] [place]  [facility], [facility] Clinical Social Worker II Direct Dial: [phone]  Fax: [phone] Website: [email or web address]

## 2023-03-23 ENCOUNTER — Other Ambulatory Visit: Payer: Self-pay

## 2023-03-26 ENCOUNTER — Other Ambulatory Visit: Payer: Self-pay | Admitting: Internal Medicine

## 2023-03-28 ENCOUNTER — Other Ambulatory Visit: Payer: Self-pay

## 2023-03-29 DIAGNOSIS — Z961 Presence of intraocular lens: Secondary | ICD-10-CM | POA: Diagnosis not present

## 2023-03-29 DIAGNOSIS — H02056 Trichiasis without entropian left eye, unspecified eyelid: Secondary | ICD-10-CM | POA: Diagnosis not present

## 2023-03-29 DIAGNOSIS — H02053 Trichiasis without entropian right eye, unspecified eyelid: Secondary | ICD-10-CM | POA: Diagnosis not present

## 2023-03-29 DIAGNOSIS — Z79899 Other long term (current) drug therapy: Secondary | ICD-10-CM | POA: Diagnosis not present

## 2023-03-29 DIAGNOSIS — L121 Cicatricial pemphigoid: Secondary | ICD-10-CM | POA: Diagnosis not present

## 2023-03-29 DIAGNOSIS — E119 Type 2 diabetes mellitus without complications: Secondary | ICD-10-CM | POA: Diagnosis not present

## 2023-03-29 DIAGNOSIS — H3581 Retinal edema: Secondary | ICD-10-CM | POA: Diagnosis not present

## 2023-03-30 ENCOUNTER — Encounter: Payer: Self-pay | Admitting: Internal Medicine

## 2023-03-30 ENCOUNTER — Ambulatory Visit (INDEPENDENT_AMBULATORY_CARE_PROVIDER_SITE_OTHER): Payer: Medicare Other | Admitting: Internal Medicine

## 2023-03-30 VITALS — BP 164/60 | HR 95 | Ht 61.0 in | Wt 251.2 lb

## 2023-03-30 DIAGNOSIS — Z170 Estrogen receptor positive status [ER+]: Secondary | ICD-10-CM

## 2023-03-30 DIAGNOSIS — L121 Cicatricial pemphigoid: Secondary | ICD-10-CM

## 2023-03-30 DIAGNOSIS — E1142 Type 2 diabetes mellitus with diabetic polyneuropathy: Secondary | ICD-10-CM

## 2023-03-30 DIAGNOSIS — E039 Hypothyroidism, unspecified: Secondary | ICD-10-CM | POA: Diagnosis not present

## 2023-03-30 DIAGNOSIS — R42 Dizziness and giddiness: Secondary | ICD-10-CM

## 2023-03-30 DIAGNOSIS — C50412 Malignant neoplasm of upper-outer quadrant of left female breast: Secondary | ICD-10-CM

## 2023-03-30 DIAGNOSIS — R5381 Other malaise: Secondary | ICD-10-CM | POA: Insufficient documentation

## 2023-03-30 DIAGNOSIS — R296 Repeated falls: Secondary | ICD-10-CM | POA: Diagnosis not present

## 2023-03-30 DIAGNOSIS — Z741 Need for assistance with personal care: Secondary | ICD-10-CM

## 2023-03-30 DIAGNOSIS — I10 Essential (primary) hypertension: Secondary | ICD-10-CM | POA: Diagnosis not present

## 2023-03-30 DIAGNOSIS — Z794 Long term (current) use of insulin: Secondary | ICD-10-CM

## 2023-03-30 MED ORDER — MECLIZINE HCL 25 MG PO TABS: 25.0000 mg | ORAL_TABLET | Freq: Three times a day (TID) | ORAL | 0 refills | Status: DC | PRN

## 2023-03-30 MED ORDER — LOSARTAN POTASSIUM 50 MG PO TABS
50.0000 mg | ORAL_TABLET | Freq: Every day | ORAL | 1 refills | Status: DC
Start: 2023-03-30 — End: 2023-05-06

## 2023-03-30 NOTE — Assessment & Plan Note (Signed)
 Lab Results  Component Value Date   HGBA1C 6.0 08/17/2022   Well-controlled On Lantus and ISS - followed by Endocrinology On Ozempic and Farxiga Advised to follow diabetic diet On ARB Diabetic eye exam: Advised to follow up with Ophthalmology for diabetic eye exam

## 2023-03-30 NOTE — Assessment & Plan Note (Signed)
 Likely due to diabetic neuropathy, OA of knee and ankle, DDD of lumbar spine and muscular atrophy Referred to home health for PT  Currently uses cane, but still has falls - would need wheelchair, but unable to operate manually due to OA of hands, does not have 24-hour caregiver to propel the manual wheelchair  Dependent for ADLs  She can safely use electric wheelchair. She is willing and motivated to use the power mobility device in the home, which will improve her independence for mobility and improve her safety.

## 2023-03-30 NOTE — Assessment & Plan Note (Addendum)
 BP Readings from Last 1 Encounters:  03/30/23 (!) 164/60   Uncontrolled with Coreg 3.125 mg BID Was on Losartan 25 mg QD in the past, added Losartan 50 mg QD considering her current BP Counseled for compliance with the medications Advised DASH diet and moderate exercise/walking as tolerated

## 2023-03-30 NOTE — Progress Notes (Signed)
 Established Patient Office Visit  Subjective:  Patient ID: Trinty Marken Bedingfield, female    DOB: [birth year]  Age: 73 y.o. MRN: [id number]  CC:  Chief Complaint  Patient presents with   Hypertension    Four month follow up     HPI Braidyn A Trott is a 73 y.o. female with past medical history of HTN, type II DM with neuropathy, OSA, hypothyroidism, OA of multiple joints, gout, MDD, breast cancer s/p lumpectomy and morbid obesity who presents for f/u of her chronic medical conditions.   HTN: Her BP was elevated in the office today.  She takes Coreg, but has run out of Losartan.  She denies any headache, dizziness, chest pain, or palpitations currently.  Type II DM with HLD: Followed by endocrinology.  She is on Ozempic, Lantus 62 U QD and ISS.  She also takes [place].  Her last HbA1C was 6.0 in 07/24. She denies any polyuria or polydipsia currently.  She takes gabapentin for neuropathy, but still has severe burning pain of bilateral feet.  She also has chronic numbness of bilateral feet.  She used to follow-up with neurology, but has not visited them for last few months.  Diabetic neuropathy and recurrent falls: She has had recurrent falls due to neuropathy, OA of knee and visual disturbance.  She currently takes gabapentin for neuropathy.  She has been using cane, but still loses balance due to weakness of legs.  Denies any recent major injury, including head injury or major bruising.  She is unable to use manual wheelchair due to OA of hands as well.  She would benefit from electric wheelchair, which will improve her mobility and safety.  She has history of left breast cancer, s/p lumpectomy.  She is on Aromasin for ER/PR positive breast cancer and follows up with Oncology. She has completed radiation for recurrent breast cancer.   Past Medical History:  Diagnosis Date   Abdominal wall contusion 12/09/2011   Achilles tendon contracture, bilateral 06/03/2016   Anxiety    Arthritis    Blood  dyscrasia    low platelet count- sees [name] , Dr. Nelva Nay ( Note in EPIC from 07/2014) at [facility]   Breast cancer [facility]) 2017   Left Breast   Breast cancer (HCC) 07/2021   right breast IDC   Cancer (HCC)    left breast   Chest wall contusion 12/09/2011   Cirrhosis of liver (HCC)    Cough    Diabetes mellitus without complication (HCC)    Diabetic neuropathy (HCC)    Diarrhea due to drug 01/29/2021   Dyspnea    Gout    Gout    History of radiation therapy 10/22/15 - 12/08/15   left breast 50.4 Gy, boost to 10 Gy   History of radiation therapy    Right breast- 09/22/21-10/30/21- Dr. 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   Neuromuscular disorder (HCC)    diabetic neuropathy   Neuropathy    Personal history of radiation therapy 2017   Left Breast Cancer   Posterior tibial tendon dysfunction (PTTD) of right lower extremity 10/09/2019   Sleep apnea    CPAP nightly   Spleen enlarged    Thyroid disease     Past Surgical History:  Procedure Laterality Date   ANKLE FUSION Right 2022   BIOPSY  10/19/2022   Procedure: BIOPSY;  Surgeon: Dolores Frame, MD;  Location: [facility];  Service: Gastroenterology;;   BREAST BIOPSY Left 08/04/2015   Procedure: LEFT BREAST BIOPSY WITH NEEDLE LOCALIZATION;  Surgeon: Darnell Level, MD;  Location: [facility];  Service: General;  Laterality: Left;   BREAST BIOPSY Right 06/23/2021   BREAST DUCTAL SYSTEM EXCISION Left 08/04/2015   Procedure: LEFT EXCISION DUCTAL SYSTEM BREAST;  Surgeon: Darnell Level, MD;  Location: [facility];  Service: General;  Laterality: Left;   BREAST LUMPECTOMY Left 08/2015   BREAST LUMPECTOMY Right 08/2021   BREAST LUMPECTOMY WITH AXILLARY LYMPH NODE BIOPSY Left 09/12/2015   Procedure: RE-EXCISION OF LEFT BREAST LUMPECTOMY WITH LEFT AXILLARY LYMPH NODE BIOPSY;  Surgeon: Darnell Level, MD;  Location:  [facility];  Service: General;  Laterality: Left;   BREAST LUMPECTOMY WITH RADIOACTIVE SEED LOCALIZATION Right 08/12/2021   Procedure: RIGHT BREAST LUMPECTOMY WITH RADIOACTIVE SEED LOCALIZATION;  Surgeon: Abigail Miyamoto, MD;  Location: [facility];  Service: General;  Laterality: Right;  LMA   CHOLECYSTECTOMY     ESOPHAGOGASTRODUODENOSCOPY (EGD) WITH PROPOFOL N/A 10/19/2022   Procedure: ESOPHAGOGASTRODUODENOSCOPY (EGD) WITH PROPOFOL;  Surgeon: Dolores Frame, MD;  Location: [facility];  Service: Gastroenterology;  Laterality: N/A;  10:30am asa 3   EYE SURGERY Bilateral 2022   Lashes growing backwards into the eye   JOINT REPLACEMENT     left knee   JOINT REPLACEMENT  2017   right knee   REVERSE SHOULDER ARTHROPLASTY Right 09/07/2018   Procedure: RIGHT REVERSE SHOULDER ARTHROPLASTY;  Surgeon: Cammy Copa, MD;  Location: [facility];  Service: Orthopedics;  Laterality: Right;   TOTAL KNEE ARTHROPLASTY Right 02/28/2015   Procedure: RIGHT TOTAL KNEE ARTHROPLASTY;  Surgeon: Kathryne Hitch, MD;  Location: [facility];  Service: Orthopedics;  Laterality: Right;   TUBAL LIGATION      Family History  Problem Relation Age of Onset   Hypothyroidism Mother    Alzheimer's disease Mother    Diabetes Father    Cancer Sister        GYN cancer   Hypothyroidism Sister    Heart disease Brother    Arthritis Son    Diabetes Son    Arthritis Son    Cancer Maternal Grandmother    Heart failure Maternal Grandfather    Bone cancer Paternal Grandmother    Heart failure Paternal Grandmother     Social History   Socioeconomic History   Marital status: Married    Spouse name: [name] Lippy   Number of children: 3   Years of education: 12th   Highest education level: 12th grade  Occupational History   Occupation: Retired    Comment: Dance movement psychotherapist  Tobacco Use   Smoking status: Never    Passive exposure: Never   Smokeless tobacco: Never   Vaping Use   Vaping status: Never Used  Substance and Sexual Activity   Alcohol use: No   Drug use: No   Sexual activity: Yes    Partners: Male    Birth control/protection: Surgical    Comment: BTL  Other Topics Concern   Not on file  Social History Narrative   Not on file   Social Drivers of Health   Financial Resource Strain: Low Risk  (03/16/2023)   Overall Financial Resource Strain (CARDIA)    Difficulty of Paying Living Expenses: Not hard at all  Food Insecurity: No Food Insecurity (03/16/2023)   Hunger Vital Sign    Worried About Running Out of Food in the Last Year: Never  true    Ran Out of Food in the Last Year: Never true  Transportation Needs: No Transportation Needs (03/16/2023)   PRAPARE - Administrator, Civil Service (Medical): No    Lack of Transportation (Non-Medical): No  Physical Activity: Sufficiently Active (03/16/2023)   Exercise Vital Sign    Days of Exercise per Week: 5 days    Minutes of Exercise per Session: 50 min  Stress: No Stress Concern Present (03/16/2023)   [employer] of Occupational Health - Occupational Stress Questionnaire    Feeling of Stress : Only a little  Social Connections: Socially Integrated (03/16/2023)   Social Connection and Isolation Panel [NHANES]    Frequency of Communication with Friends and Family: More than three times a week    Frequency of Social Gatherings with Friends and Family: More than three times a week    Attends Religious Services: More than 4 times per year    Active Member of [employer] or Organizations: Yes    Attends Engineer, structural: More than 4 times per year    Marital Status: Married  Catering manager Violence: Not At Risk (03/16/2023)   Humiliation, Afraid, Rape, and Kick questionnaire    Fear of Current or Ex-Partner: No    Emotionally Abused: No    Physically Abused: No    Sexually Abused: No    Outpatient Medications Prior to Visit  Medication Sig Dispense Refill    ACCU-CHEK AVIVA PLUS test strip USE AS INSTRUCTED TO CHECK BLOOD SUGAR 2 TIMES DAILY 100 strip 5   acetaminophen (TYLENOL) 500 MG tablet Take by mouth as needed.     albuterol (VENTOLIN HFA) 108 (90 Base) MCG/ACT inhaler Inhale 1-2 puffs into the lungs every 4 (four) hours as needed for wheezing or shortness of breath. 18 g 2   Ascorbic Acid (VITAMIN C PO) Take by mouth daily.     buPROPion (WELLBUTRIN XL) 150 MG 24 hr tablet TAKE 1 TABLET BY MOUTH EVERY DAY IN THE MORNING 90 tablet 1   carvedilol (COREG) 3.125 MG tablet TAKE 1 TABLET BY MOUTH TWICE A DAY WITH A MEAL 180 tablet 0   Cyanocobalamin (VITAMIN B-12 PO) Take by mouth daily.     exemestane (AROMASIN) 25 MG tablet TAKE 1 TABLET (25 MG TOTAL) BY MOUTH DAILY AFTER BREAKFAST. 90 tablet 1   FARXIGA 10 MG TABS tablet TAKE 1 TABLET BY MOUTH DAILY BEFORE BREAKFAST. 90 tablet 3   fluticasone (FLONASE) 50 MCG/ACT nasal spray Place 1 spray into both nostrils as needed.      gabapentin (NEURONTIN) 600 MG tablet TAKE 1 TABLET IN THE MORNING AND 1 TABLET IN THE AFTERNOON AND 2TABS AT BEDTIME     insulin glargine (LANTUS SOLOSTAR) 100 UNIT/ML Solostar Pen Inject 62 Units into the skin daily. 30 mL 0   Insulin Pen Needle (B-D UF III MINI PEN NEEDLES) 31G X 5 MM MISC USE AS DIRECTED WITH INSULIN EVERY MORNING, NOON, EVENING AND AT BEDTIME 400 each 3   Insulin Pen Needle 30G X 5 MM MISC 1 Device by Does not apply route in the morning, at noon, in the evening, and at bedtime. 400 each 3   ketoconazole (NIZORAL) 2 % cream Apply 1 Application topically daily. 60 g 0   levothyroxine (SYNTHROID) 150 MCG tablet Take 1 tablet (150 mcg total) by mouth daily. 90 tablet 3   methocarbamol (ROBAXIN) 500 MG tablet Take 1 tablet (500 mg total) by mouth every 8 (eight) hours as  needed for muscle spasms. 30 tablet 0   mirabegron ER (MYRBETRIQ) 25 MG TB24 tablet Take 1 tablet (25 mg total) by mouth daily. 30 tablet 11   Multiple Vitamins-Minerals (MULTIVITAMIN WITH  MINERALS) tablet Take 1 tablet by mouth daily.     mycophenolate (CELLCEPT) 500 MG tablet Take 1,500 mg by mouth 2 (two) times daily.     omeprazole (PRILOSEC) 20 MG capsule Take 1 capsule (20 mg total) by mouth daily. 90 capsule 3   promethazine-dextromethorphan (PROMETHAZINE-DM) 6.25-15 MG/5ML syrup Take 5 mLs by mouth 4 (four) times daily as needed for cough. 180 mL 0   Pyridoxine HCl (VITAMIN B-6 PO) Take by mouth daily.     Semaglutide, 1 MG/DOSE, (OZEMPIC, 1 MG/DOSE,) 4 MG/3ML SOPN Inject 1 mg into the skin once a week. 3 mL 5   traMADol (ULTRAM) 50 MG tablet Take 1 tablet (50 mg total) by mouth every 12 (twelve) hours as needed. 30 tablet 1   UNABLE TO FIND 1 each by Does not apply route daily. Med Name: POWER WHEELCHAIR  DX CODE-R26.89 1 each 0   VITAMIN D PO Take by mouth daily.     losartan (COZAAR) 25 MG tablet Take 1 tablet (25 mg total) by mouth daily. 90 tablet 1   No facility-administered medications prior to visit.    Allergies  Allergen Reactions   Codeine Swelling   Influenza A (H1n1) Monoval Pf Anaphylaxis   Hydrocodone-Acetaminophen Other (See Comments)   Lisinopril Other (See Comments)   Metformin Other (See Comments)   Pregabalin Other (See Comments)   Jardiance [Empagliflozin] Other (See Comments)    dehydration    ROS Review of Systems  Constitutional:  Negative for chills and fever.  HENT:  Negative for congestion, sinus pressure and sinus pain.   Respiratory:  Negative for cough, shortness of breath and wheezing.   Cardiovascular:  Negative for chest pain and palpitations.  Gastrointestinal:  Negative for diarrhea, nausea and vomiting.  Genitourinary:  Negative for dysuria and hematuria.  Musculoskeletal:  Positive for arthralgias, back pain, gait problem and neck pain.  Skin:  Negative for rash.  Neurological:  Positive for dizziness, weakness and numbness.  Psychiatric/Behavioral:  Negative for agitation and behavioral problems.       Objective:     Physical Exam Vitals reviewed.  Constitutional:      General: She is not in acute distress.    Appearance: She is obese. She is not diaphoretic.  HENT:     Head: Normocephalic and atraumatic.     Nose: No congestion.     Mouth/Throat:     Mouth: Mucous membranes are moist.  Eyes:     General: No scleral icterus.    Extraocular Movements: Extraocular movements intact.  Cardiovascular:     Rate and Rhythm: Normal rate and regular rhythm.     Pulses: Normal pulses.     Heart sounds: Normal heart sounds. No murmur heard. Pulmonary:     Breath sounds: Normal breath sounds. No wheezing or rales.  Musculoskeletal:     Left shoulder: Tenderness present. Decreased range of motion. Decreased strength.     Cervical back: Neck supple. No tenderness.     Right lower leg: No edema.     Left lower leg: No edema.  Skin:    General: Skin is warm.     Findings: No rash.  Neurological:     General: No focal deficit present.     Mental Status: She is alert and oriented  to person, place, and time.     Sensory: Sensory deficit present.     Motor: Weakness (3/5 in b/l LE, 4/5 in b/l UE) present.     Gait: Gait abnormal.  Psychiatric:        Mood and Affect: Mood normal.        Behavior: Behavior normal.     BP (!) 164/60 (BP Location: Left Arm)   Pulse 95   Ht 5\' 1"  (1.549 m)   Wt 251 lb 3.2 oz (113.9 kg)   SpO2 96%   BMI 47.46 kg/m  Wt Readings from Last 3 Encounters:  03/30/23 251 lb 3.2 oz (113.9 kg)  11/25/22 253 lb 12.8 oz (115.1 kg)  10/19/22 246 lb 14.6 oz (112 kg)    Lab Results  Component Value Date   TSH 4.28 12/14/2021   Lab Results  Component Value Date   WBC 2.9 (L) 10/13/2022   HGB 13.1 10/13/2022   HCT 40.7 10/13/2022   MCV 91.5 10/13/2022   PLT 63 (L) 10/13/2022   Lab Results  Component Value Date   NA 136 10/13/2022   K 3.5 10/13/2022   CHLORIDE 104 10/06/2016   CO2 23 10/13/2022   GLUCOSE 148 (H) 10/13/2022   BUN 16 10/13/2022   CREATININE 0.77  10/13/2022   BILITOT 0.7 08/09/2022   ALKPHOS 106 07/29/2022   AST 28 08/09/2022   ALT 16 08/09/2022   PROT 7.1 08/09/2022   ALBUMIN 3.8 07/29/2022   CALCIUM 9.7 10/13/2022   ANIONGAP 10 10/13/2022   EGFR 50 (L) 10/06/2016   GFR 50.27 (L) 08/24/2019   Lab Results  Component Value Date   CHOL 171 06/09/2021   Lab Results  Component Value Date   HDL 63.30 06/09/2021   Lab Results  Component Value Date   LDLCALC 80 06/09/2021   Lab Results  Component Value Date   TRIG 137.0 06/09/2021   Lab Results  Component Value Date   CHOLHDL 3 06/09/2021   Lab Results  Component Value Date   HGBA1C 6.0 08/17/2022      Assessment & Plan:   Problem List Items Addressed This Visit       Cardiovascular and Mediastinum   Essential hypertension - Primary   BP Readings from Last 1 Encounters:  03/30/23 (!) 164/60   Uncontrolled with Coreg 3.125 mg BID Was on Losartan 25 mg QD in the past, added Losartan 50 mg QD considering her current BP Counseled for compliance with the medications Advised DASH diet and moderate exercise/walking as tolerated      Relevant Medications   losartan (COZAAR) 50 MG tablet     Endocrine   Diabetic polyneuropathy associated with type 2 diabetes mellitus (HCC)   On Gabapentin 600 mg in morning and afternoon, and 1200 mg at bedtime Used to follow Neurology, advised to contact for persistent symptoms      Relevant Medications   losartan (COZAAR) 50 MG tablet   Other Relevant Orders   Ambulatory referral to Home Health   DME Wheelchair electric   Type 2 diabetes mellitus with diabetic polyneuropathy, with long-term current use of insulin (HCC)   Lab Results  Component Value Date   HGBA1C 6.0 08/17/2022   Well-controlled On Lantus and ISS - followed by Endocrinology On Ozempic and Farxiga Advised to follow diabetic diet On ARB Diabetic eye exam: Advised to follow up with Ophthalmology for diabetic eye exam      Relevant Medications    losartan (COZAAR) 50  MG tablet   Other Relevant Orders   Bayer DCA Hb A1c Waived   Acquired hypothyroidism   On Levothyroxine 150 mcg QD Followed by Endocrinology        Other   Recurrent falls   Likely due to diabetic neuropathy, OA of knee and ankle, DDD of lumbar spine and muscular atrophy Referred to home health for PT  Currently uses cane, but still has falls - would need wheelchair, but unable to operate manually due to OA of hands, does not have 24-hour caregiver to propel the manual wheelchair  Dependent for ADLs  She can safely use electric wheelchair. She is willing and motivated to use the power mobility device in the home, which will improve her independence for mobility and improve her safety.      Relevant Orders   DME Wheelchair electric   Malignant neoplasm of upper-outer quadrant of left breast in female, estrogen receptor positive (HCC)   S/p lumpectomy, followed by Oncology On Aromasin Had recurrence, s/p radiation      Relevant Medications   meclizine (ANTIVERT) 25 MG tablet   Vertigo   Recent worsening of dizziness could be due to vertigo Avoid sudden positional changes Maintain adequate hydration Meclizine as needed for dizziness      Relevant Medications   meclizine (ANTIVERT) 25 MG tablet   Ocular cicatricial pemphigoid   On mycophenolate and oral steroids, gets Privigen now Followed by ophthalmology at [facility] - planning to start immunoglobulin treatment      Relevant Orders   Ambulatory referral to Home Health   DME Wheelchair electric   Physical deconditioning   Likely due to chronic medical conditions including chronic leg weakness from diabetic neuropathy, recent cancer treatments, OA of knee, ankle and DDD of lumbar spine Has had recurrent falls despite using cane Would benefit from home PT Needs electric wheelchair for safe mobility      Relevant Orders   Ambulatory referral to Home Health   DME Wheelchair electric   Other  Visit Diagnoses       Requires assistance with activities of daily living (ADL)       Relevant Orders   DME Wheelchair electric         Meds ordered this encounter  Medications   losartan (COZAAR) 50 MG tablet    Sig: Take 1 tablet (50 mg total) by mouth daily.    Dispense:  90 tablet    Refill:  1   meclizine (ANTIVERT) 25 MG tablet    Sig: Take 1 tablet (25 mg total) by mouth 3 (three) times daily as needed for dizziness.    Dispense:  30 tablet    Refill:  0    Follow-up: Return in about 4 months (around 07/28/2023) for HTN and DM.    Anabel Halon, MD

## 2023-03-30 NOTE — Assessment & Plan Note (Signed)
 Likely due to chronic medical conditions including chronic leg weakness from diabetic neuropathy, recent cancer treatments, OA of knee, ankle and DDD of lumbar spine Has had recurrent falls despite using cane Would benefit from home PT Needs electric wheelchair for safe mobility

## 2023-03-30 NOTE — Assessment & Plan Note (Signed)
 Recent worsening of dizziness could be due to vertigo Avoid sudden positional changes Maintain adequate hydration Meclizine as needed for dizziness

## 2023-03-30 NOTE — Assessment & Plan Note (Signed)
 S/p lumpectomy, followed by Oncology On Aromasin Had recurrence, s/p radiation

## 2023-03-30 NOTE — Assessment & Plan Note (Signed)
 On Levothyroxine 150 mcg QD Followed by Endocrinology

## 2023-03-30 NOTE — Assessment & Plan Note (Signed)
On Gabapentin 600 mg in morning and afternoon, and 1200 mg at bedtime Used to follow Neurology, advised to contact for persistent symptoms 

## 2023-03-30 NOTE — Patient Instructions (Addendum)
 Please start taking Losartan 50 mg once daily. Continue taking Carvedilol as prescribed.  Please take Meclizine as prescribed for dizziness.  Please continue to take medications as prescribed.  Please continue to follow low carb diet and ambulate as tolerated.

## 2023-03-30 NOTE — Assessment & Plan Note (Signed)
 On mycophenolate and oral steroids, gets Privigen now Followed by ophthalmology at [facility] - planning to start immunoglobulin treatment

## 2023-03-31 DIAGNOSIS — Z556 Problems related to health literacy: Secondary | ICD-10-CM | POA: Diagnosis not present

## 2023-03-31 DIAGNOSIS — G4733 Obstructive sleep apnea (adult) (pediatric): Secondary | ICD-10-CM | POA: Diagnosis not present

## 2023-03-31 DIAGNOSIS — L121 Cicatricial pemphigoid: Secondary | ICD-10-CM | POA: Diagnosis not present

## 2023-03-31 DIAGNOSIS — M109 Gout, unspecified: Secondary | ICD-10-CM | POA: Diagnosis not present

## 2023-03-31 DIAGNOSIS — F329 Major depressive disorder, single episode, unspecified: Secondary | ICD-10-CM | POA: Diagnosis not present

## 2023-03-31 DIAGNOSIS — I10 Essential (primary) hypertension: Secondary | ICD-10-CM | POA: Diagnosis not present

## 2023-03-31 DIAGNOSIS — E785 Hyperlipidemia, unspecified: Secondary | ICD-10-CM | POA: Diagnosis not present

## 2023-03-31 DIAGNOSIS — Z6841 Body Mass Index (BMI) 40.0 and over, adult: Secondary | ICD-10-CM | POA: Diagnosis not present

## 2023-03-31 DIAGNOSIS — E039 Hypothyroidism, unspecified: Secondary | ICD-10-CM | POA: Diagnosis not present

## 2023-03-31 DIAGNOSIS — Z7984 Long term (current) use of oral hypoglycemic drugs: Secondary | ICD-10-CM | POA: Diagnosis not present

## 2023-03-31 DIAGNOSIS — Z7985 Long-term (current) use of injectable non-insulin antidiabetic drugs: Secondary | ICD-10-CM | POA: Diagnosis not present

## 2023-03-31 DIAGNOSIS — C50412 Malignant neoplasm of upper-outer quadrant of left female breast: Secondary | ICD-10-CM | POA: Diagnosis not present

## 2023-03-31 DIAGNOSIS — M150 Primary generalized (osteo)arthritis: Secondary | ICD-10-CM | POA: Diagnosis not present

## 2023-03-31 DIAGNOSIS — F419 Anxiety disorder, unspecified: Secondary | ICD-10-CM | POA: Diagnosis not present

## 2023-03-31 DIAGNOSIS — Z794 Long term (current) use of insulin: Secondary | ICD-10-CM | POA: Diagnosis not present

## 2023-03-31 DIAGNOSIS — E1142 Type 2 diabetes mellitus with diabetic polyneuropathy: Secondary | ICD-10-CM | POA: Diagnosis not present

## 2023-03-31 DIAGNOSIS — Z9181 History of falling: Secondary | ICD-10-CM | POA: Diagnosis not present

## 2023-03-31 LAB — BAYER DCA HB A1C WAIVED: HB A1C (BAYER DCA - WAIVED): 7 % — ABNORMAL HIGH (ref 4.8–5.6)

## 2023-04-01 ENCOUNTER — Ambulatory Visit: Payer: Self-pay | Admitting: *Deleted

## 2023-04-01 NOTE — Patient Outreach (Signed)
 Care Coordination   Follow Up Visit Note   04/01/2023  Name: Tamala Manzer Tortorella MRN: [id number] DOB: Dec 21, 1950  Maceo Pro Shibley is a 73 y.o. year old female who sees Anabel Halon, MD for primary care. I spoke with Willaim Sheng A Leitch by phone today.  What matters to the patients health and wellness today?  Receive Help in The Home.    Goals Addressed               This Visit's Progress     Receive Help in The Home. (pt-stated)   On track     Care Coordination Interventions:  Interventions Today    Flowsheet Row Most Recent Value  Chronic Disease   Chronic disease during today's visit Diabetes, Hypertension (HTN), Other  [Recurrent Falls, Recurrent Major Depression, Morbid Obesity, Anxiety Disorder, Bilateral Lower Extremity Edema, Nonalcoholic Steatohepatitis, Cirrhosis of Liver without Acites, Degenerative Arthritis of Spine, Arthritis of Shoulder, Gout, Polyneuropathy.]  General Interventions   General Interventions Discussed/Reviewed General Interventions Discussed, General Interventions Reviewed, Doctor Visits, Communication with, Community Resources  [Encouraged Routine Engagement with Care Team Members & Providers.]  Doctor Visits Discussed/Reviewed Doctor Visits Discussed, Specialist, Doctor Visits Reviewed, Annual Wellness Visits, PCP  [Encouraged Routine Engagement with Care Team Members & Providers.]  PCP/Specialist Visits Compliance with follow-up visit  [Encouraged Routine Engagement with Care Team Members & Providers.]  Communication with PCP/Specialists, Charity fundraiser, Pharmacists, Social Work  [employer] Routine Engagement with Care Team Members & Providers.]  Level of Care Adult Daycare, Assisted Living, Skilled Nursing Facility  [Confirmed Disinterest in Enrollment in Adult Day Care Program. Confirmed Disinterest in Receiving Assistance Pursuing Higher Level of Care Placement Options (I.e Assisted Living Versus Skilled Nursing Facility).]  Applications Medicaid, Personal Care Services   [Confirmed Disinterest in Applying for Medicaid or Personal Care Services.]  Education Interventions   Education Provided Provided Education  [Thoroughly Reviewed Educational Material to Entertain Questions & Ensure Understanding.]  Provided Verbal Education On Mental Health/Coping with Illness, When to see the doctor, Community Resources  [Encouraged Continued Independent Review of Educational Material Provided.]  Ship broker, Personal Care Services  [Confirmed Disinterest in Applying for [employer] or Personal Care Services.]  Mental Health Interventions   Mental Health Discussed/Reviewed Mental Health Discussed, Anxiety, Depression, Grief and Loss, Mental Health Reviewed, Substance Abuse, Coping Strategies, Suicide, Crisis, Other  [Assessed Mental Health & Cognitive Status.]  Safety Interventions   Safety Discussed/Reviewed Safety Discussed, Safety Reviewed  [Encouraged Routine Use of Assistive Devices & Durable Medical Equipment.]  Home Safety Assistive Devices  [Encouraged Consideration of Home Safety Evaluation.]  Advanced Directive Interventions   Advanced Directives Discussed/Reviewed Advanced Directives Discussed, Advanced Directives Reviewed  [Encouraged Initiation of Advanced Directives (Living Will & Healthcare Power of Corporate treasurer), Offering to [employer], Assist with Completion, Make Copies & Scan into Electronic Medical Record in Epic.]      Active Listening & Reflection Utilized.  Verbalization of Feelings Encouraged.  Emotional Support Provided. Cognitive Behavioral Therapy Implemented. Acceptance & Commitment Therapy Conducted. Client-Centered Therapy Indicated. CSW Collaboration with Dr. Trena Platt, Primary Care Provider with [facility] Primary Care [phone]), Via Secure Chat Message & Routed Note in Epic, to Request Order for Electric Scooter for Safe Mobility. CSW Collaboration with Dr. Trena Platt, Primary Care Provider with [facility] Primary Care [phone]), Via Secure Chat Message & Routed Note in Epic, to Report Recurrent Falls Despite Use of Cane, Requesting Order for Home Health Physical Therapy for Strengthening, Mobility, Safety, Conditioning,  Etc. CSW Collaboration with Dr. Trena Platt, Primary Care Provider with [facility] Primary Care [phone]), Via Secure Chat Message & Routed Note in Epic, Requesting Order for Home Health Nursing for Monitoring of Blood Pressure Monitoring, Diabetic Neuropathy & Bilateral Chronic Leg Pain & Weakness. CSW Collaboration with Dr. Trena Platt, Primary Care Provider with [facility] Primary Care [phone]), Via Secure Chat Message & Routed Note in Epic, to Report Frequent, Unexplained Nose Bleeds. CSW Collaboration with Dr. Trena Platt, Primary Care Provider with [facility] Primary Care [phone]), Via Secure Chat Message & Routed Note in Epic, to Confirm Order for Home Health Physical Therapist Written & Submitted to [facility] [phone]).   ~ Approval for Physical Therapy 2 Times Per Week for 9 Weeks. CSW Collaboration with Dr. Trena Platt, Primary Care Provider with [facility] Primary Care [phone]), Via Secure Chat Message & Routed Note in Epic, to Confirm Order for Home Health Nurse Written & Submitted to [facility] [phone]). ~ Approved for Nursing 1 Time Per Week for 9 Weeks.  Encouraged Routine Engagement with Danford Bad, Licensed Clinical Social Worker with [facility], [facility] [phone]), if You Have Questions, Need Assistance, or If Additional Social Work Needs Are Identified Between Now & Our Next Follow-Up Outreach Call, Scheduled on 04/18/2023 at 3:15 PM.      SDOH assessments and interventions completed:  Yes.  Care Coordination Interventions:  Yes, provided.   Follow up plan: Follow up call  scheduled for 04/18/2023 at 3:15 pm.  Encounter Outcome:  Patient Visit Completed.   Danford Bad, BSW, MSW, LCSW [facility], [facility] Clinical Social Worker II Direct Dial: [phone]  Fax: [phone] Website: [email or web address]

## 2023-04-01 NOTE — Patient Instructions (Signed)
 Visit Information  Thank you for taking time to visit with me today. Please don't hesitate to contact me if I can be of assistance to you.   Following are the goals we discussed today:   Goals Addressed               This Visit's Progress     Receive Help in The Home. (pt-stated)   On track     Care Coordination Interventions:  Interventions Today    Flowsheet Row Most Recent Value  Chronic Disease   Chronic disease during today's visit Diabetes, Hypertension (HTN), Other  [Recurrent Falls, Recurrent Major Depression, Morbid Obesity, Anxiety Disorder, Bilateral Lower Extremity Edema, Nonalcoholic Steatohepatitis, Cirrhosis of Liver without Acites, Degenerative Arthritis of Spine, Arthritis of Shoulder, Gout, Polyneuropathy.]  General Interventions   General Interventions Discussed/Reviewed General Interventions Discussed, General Interventions Reviewed, Doctor Visits, Communication with, Community Resources  [Encouraged Routine Engagement with Care Team Members & Providers.]  Doctor Visits Discussed/Reviewed Doctor Visits Discussed, Specialist, Doctor Visits Reviewed, Annual Wellness Visits, PCP  [Encouraged Routine Engagement with Care Team Members & Providers.]  PCP/Specialist Visits Compliance with follow-up visit  [Encouraged Routine Engagement with Care Team Members & Providers.]  Communication with PCP/Specialists, Charity fundraiser, Pharmacists, Social Work  [employer] Routine Engagement with Care Team Members & Providers.]  Level of Care Adult Daycare, Assisted Living, Skilled Nursing Facility  [Confirmed Disinterest in Enrollment in Adult Day Care Program. Confirmed Disinterest in Receiving Assistance Pursuing Higher Level of Care Placement Options (I.e Assisted Living Versus Skilled Nursing Facility).]  Applications Medicaid, Personal Care Services  [Confirmed Disinterest in Applying for Medicaid or Personal Care Services.]  Education Interventions   Education Provided Provided Education   [Thoroughly Reviewed Educational Material to Entertain Questions & Ensure Understanding.]  Provided Verbal Education On Mental Health/Coping with Illness, When to see the doctor, Community Resources  [Encouraged Continued Independent Review of Educational Material Provided.]  Ship broker, Personal Care Services  [Confirmed Disinterest in Applying for [employer] or Personal Care Services.]  Mental Health Interventions   Mental Health Discussed/Reviewed Mental Health Discussed, Anxiety, Depression, Grief and Loss, Mental Health Reviewed, Substance Abuse, Coping Strategies, Suicide, Crisis, Other  [Assessed Mental Health & Cognitive Status.]  Safety Interventions   Safety Discussed/Reviewed Safety Discussed, Safety Reviewed  [Encouraged Routine Use of Assistive Devices & Durable Medical Equipment.]  Home Safety Assistive Devices  [Encouraged Consideration of Home Safety Evaluation.]  Advanced Directive Interventions   Advanced Directives Discussed/Reviewed Advanced Directives Discussed, Advanced Directives Reviewed  [Encouraged Initiation of Advanced Directives (Living Will & Healthcare Power of Corporate treasurer), Offering to [employer], Assist with Completion, Make Copies & Scan into Electronic Medical Record in Epic.]      Active Listening & Reflection Utilized.  Verbalization of Feelings Encouraged.  Emotional Support Provided. Cognitive Behavioral Therapy Implemented. Acceptance & Commitment Therapy Conducted. Client-Centered Therapy Indicated. CSW Collaboration with Dr. Trena Platt, Primary Care Provider with [facility] Primary Care [phone]), Via Secure Chat Message & Routed Note in Epic, to Request Order for Electric Scooter for Safe Mobility. CSW Collaboration with Dr. Trena Platt, Primary Care Provider with [facility] Primary Care [phone]), Via Secure Chat Message & Routed Note in Epic, to Report Recurrent Falls Despite Use of Cane,  Requesting Order for Home Health Physical Therapy for Strengthening, Mobility, Safety, Conditioning, Etc. CSW Collaboration with Dr. Trena Platt, Primary Care Provider with [facility] Primary Care [phone]), Via Secure Chat Message & Routed Note in Epic, Requesting Order for  Home Health Nursing for Monitoring of Blood Pressure Monitoring, Diabetic Neuropathy & Bilateral Chronic Leg Pain & Weakness. CSW Collaboration with Dr. Trena Platt, Primary Care Provider with [facility] [phone]), Via Secure Chat Message & Routed Note in Epic, to Report Frequent, Unexplained Nose Bleeds. CSW Collaboration with Dr. Trena Platt, Primary Care Provider with [facility] Primary Care [phone]), Via Secure Chat Message & Routed Note in Epic, to Confirm Order for Home Health Physical Therapist Written & Submitted to [facility] [phone]).   ~ Approval for Physical Therapy 2 Times Per Week for 9 Weeks. CSW Collaboration with Dr. Trena Platt, Primary Care Provider with [facility] Primary Care [phone]), Via Secure Chat Message & Routed Note in Epic, to Confirm Order for Home Health Nurse Written & Submitted to [facility] [phone]). ~ Approved for Nursing 1 Time Per Week for 9 Weeks.  Encouraged Routine Engagement with Danford Bad, Licensed Clinical Social Worker with [facility], [facility] [phone]), if You Have Questions, Need Assistance, or If Additional Social Work Needs Are Identified Between Now & Our Next Follow-Up Outreach Call, Scheduled on 04/18/2023 at 3:15 PM.      Our next appointment is by telephone on 04/18/2023 at 3:15 pm.  Please call the care guide team at [phone] if you need to cancel or reschedule your appointment.   If you are experiencing a Mental Health or Behavioral Health Crisis or need someone to talk to, please call the  Suicide and Crisis Lifeline: 988 call the [place] National Suicide Prevention Lifeline: (806) 127-9960 or TTY: 313-071-3763 TTY 204-482-7636) to talk to a trained counselor call 1-800-273-TALK (toll free, 24 hour hotline) go to [facility] [address], [place] [phone]) call the [facility] Crisis Line: 313-009-6482 call 911  Patient verbalizes understanding of instructions and care plan provided today and agrees to view in MyChart. Active MyChart status and patient understanding of how to access instructions and care plan via MyChart confirmed with patient.     Telephone follow up appointment with care management team member scheduled for:  04/18/2023 at 3:15 pm.  Danford Bad, BSW, MSW, LCSW [facility], [place], [facility] Clinical Social Worker II Direct Dial: [phone]  Fax: [phone] Website: [email or web address]

## 2023-04-03 DIAGNOSIS — E1142 Type 2 diabetes mellitus with diabetic polyneuropathy: Secondary | ICD-10-CM | POA: Diagnosis not present

## 2023-04-03 DIAGNOSIS — Z794 Long term (current) use of insulin: Secondary | ICD-10-CM | POA: Diagnosis not present

## 2023-04-03 DIAGNOSIS — I10 Essential (primary) hypertension: Secondary | ICD-10-CM | POA: Diagnosis not present

## 2023-04-03 DIAGNOSIS — C50412 Malignant neoplasm of upper-outer quadrant of left female breast: Secondary | ICD-10-CM | POA: Diagnosis not present

## 2023-04-03 DIAGNOSIS — Z6841 Body Mass Index (BMI) 40.0 and over, adult: Secondary | ICD-10-CM | POA: Diagnosis not present

## 2023-04-04 ENCOUNTER — Ambulatory Visit (INDEPENDENT_AMBULATORY_CARE_PROVIDER_SITE_OTHER): Payer: Medicare Other | Admitting: Podiatry

## 2023-04-04 DIAGNOSIS — Z91199 Patient's noncompliance with other medical treatment and regimen due to unspecified reason: Secondary | ICD-10-CM

## 2023-04-04 NOTE — Progress Notes (Signed)
 No show

## 2023-04-05 ENCOUNTER — Telehealth: Payer: Self-pay

## 2023-04-05 NOTE — Telephone Encounter (Signed)
 Copied from CRM [id number]. Topic: General - Other >> Apr 05, 2023 11:22 AM Emylou G wrote: Reason for CRM: Cecilia PT [facility].. requesting verbal orders for the patient for physical therapy.[name] one week one, two week two, one week five.. for weight training and strengthing.. please contact her back [phone]

## 2023-04-05 NOTE — Telephone Encounter (Signed)
 Verbal orders provided.

## 2023-04-06 DIAGNOSIS — I10 Essential (primary) hypertension: Secondary | ICD-10-CM | POA: Diagnosis not present

## 2023-04-06 DIAGNOSIS — C50412 Malignant neoplasm of upper-outer quadrant of left female breast: Secondary | ICD-10-CM | POA: Diagnosis not present

## 2023-04-06 DIAGNOSIS — E1142 Type 2 diabetes mellitus with diabetic polyneuropathy: Secondary | ICD-10-CM | POA: Diagnosis not present

## 2023-04-06 DIAGNOSIS — Z794 Long term (current) use of insulin: Secondary | ICD-10-CM | POA: Diagnosis not present

## 2023-04-06 DIAGNOSIS — Z6841 Body Mass Index (BMI) 40.0 and over, adult: Secondary | ICD-10-CM | POA: Diagnosis not present

## 2023-04-07 ENCOUNTER — Other Ambulatory Visit (INDEPENDENT_AMBULATORY_CARE_PROVIDER_SITE_OTHER): Payer: Self-pay

## 2023-04-07 ENCOUNTER — Ambulatory Visit (INDEPENDENT_AMBULATORY_CARE_PROVIDER_SITE_OTHER): Admitting: Physician Assistant

## 2023-04-07 DIAGNOSIS — M25561 Pain in right knee: Secondary | ICD-10-CM

## 2023-04-07 DIAGNOSIS — Z96651 Presence of right artificial knee joint: Secondary | ICD-10-CM

## 2023-04-07 DIAGNOSIS — G8929 Other chronic pain: Secondary | ICD-10-CM | POA: Diagnosis not present

## 2023-04-07 NOTE — Progress Notes (Signed)
 Office Visit Note   Patient: [name] [name]           Date of Birth: [birth year]           MRN: [id number] Visit Date: 04/07/2023              Requested by: [name] Halon, MD [address] [place],  [place] [address] PCP: [name] Halon, MD   Assessment & Plan: Visit Diagnoses:  1. Chronic pain of right knee   2. Status post total right knee replacement     Plan: She has home therapy coming out they will continue to work on gait balance and quad strengthening.  Pain persist or becomes worse she will follow-up with [place].  She Cretella benefit from outpatient therapy but as of now she has infusion treatments that occupy it good portion of her days.  Otherwise follow-up as needed.  Questions were encouraged and answered radiographs were reviewed.  Follow-Up Instructions: Return if symptoms worsen or fail to improve.   Orders:  Orders Placed This Encounter  Procedures   XR Knee 1-2 Views Right   No orders of the defined types were placed in this encounter.     Procedures: No procedures performed   Clinical Data: No additional findings.   Subjective: Chief Complaint  Patient presents with   Right Knee - Pain    HPI [name] [name] comes in today due to right knee pain.  She has had several falls 2 to 3 months ago.  Has had pain lateral aspect of the knee.  She has had more sedentary lifestyle secondary to vertigo and overall generalized weakness associated with her breast cancer.  History of right total knee replacement 02/28/2015 Dr. Magnus [name] Review of Systems   Objective: Vital Signs: There were no vitals taken for this visit.  Physical Exam Constitutional:      Appearance: She is not ill-appearing or diaphoretic.  Neurological:     Mental Status: She is alert and oriented to person, place, and time.  Psychiatric:        Mood and Affect: Mood normal.     Ortho Exam Bilateral knees: Good range of motion of both knees.  Slight anterior drawer right knee negative on the  left.  Valgus varus stressing reveals no laxity.  Surgical incisions well-healed bilaterally.  No abnormal warmth erythema of either knee.  No gross effusion of either knee.  Quad atrophy bilateral knees. Specialty Comments:  No specialty comments available.  Imaging: XR Knee 1-2 Views Right Result Date: 04/07/2023 Right knee AP/Lateral: No acute fracture . Status post right total knee arthroplasty with well seated components. No acute findings.     PMFS History: Patient Active Problem List   Diagnosis Date Noted   Physical deconditioning 03/30/2023   Fall 11/25/2022   COVID-19 virus infection 09/17/2022   Immunosuppressed status (HCC) 09/17/2022   Abnormal CT of the abdomen 08/09/2022   Paraumbilical hernia 07/29/2022   Esophageal varices in cirrhosis (HCC) 07/29/2022   Cirrhosis of liver without ascites (HCC) 07/12/2022   LUQ abdominal pain 07/05/2022   Drug-induced constipation 07/05/2022   Anxiety disorder 08/28/2021   Disorder of intervertebral disc of cervical spine 08/28/2021   Family history of malignant neoplasm of digestive organs 08/28/2021   Malignant neoplasm of right breast (HCC) 07/08/2021   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   Pseudophakia of both eyes 04/01/2021   Retinal edema 04/01/2021   Trichiasis of eyelid of both eyes 04/01/2021   Hypertrophy of  inferior nasal turbinate 02/26/2021   Allergic rhinitis due to pollen 01/29/2021   Allergy to influenza vaccine 01/29/2021   Gout 01/29/2021   Hyperlipidemia 01/29/2021   Nonalcoholic steatohepatitis (NASH) 01/29/2021   Primary insomnia 01/29/2021   Recurrent major depression in remission (HCC) 01/29/2021   Arthritis of right ankle 04/14/2020   Nasal septal deviation 03/11/2020   Arthritis of right subtalar joint 10/09/2019   Acquired pes planovalgus of right foot 10/09/2019   Acquired posterior equinus of right lower extremity 10/09/2019   Epistaxis, recurrent 09/26/2019   OSA on CPAP 09/26/2019    Acquired hypothyroidism 05/21/2019   Arthritis of shoulder 09/07/2018   Bilateral hearing loss 08/01/2018   Vertigo 08/01/2018   Chronic nonintractable headache 08/01/2018   Type 2 diabetes mellitus with diabetic polyneuropathy, with long-term current use of insulin (HCC) 11/16/2016   Bilateral lower extremity edema 09/01/2016   Diabetic polyneuropathy associated with type 2 diabetes mellitus (HCC) 06/03/2016   Malignant neoplasm of upper-outer quadrant of left breast in female, estrogen receptor positive (HCC) 08/19/2015   Osteoarthritis of right knee 02/28/2015   Status post total right knee replacement 02/28/2015   Splenomegaly 07/30/2014   Thrombocytopenia (HCC) 07/30/2014   Recurrent falls 01/12/2013   Morbid obesity (HCC) 01/12/2013   Degenerative arthritis of spine 01/12/2013   Essential hypertension 09/05/2012   Past Medical History:  Diagnosis Date   Abdominal wall contusion 12/09/2011   Achilles tendon contracture, bilateral 06/03/2016   Anxiety    Arthritis    Blood dyscrasia    low platelet count- sees [name] , Dr. Nelva [name] ( Note in EPIC from 07/2014) at [facility]   Breast cancer [facility]) 2017   Left Breast   Breast cancer (HCC) 07/2021   right breast IDC   Cancer (HCC)    left breast   Chest wall contusion 12/09/2011   Cirrhosis of liver (HCC)    Cough    Diabetes mellitus without complication (HCC)    Diabetic neuropathy (HCC)    Diarrhea due to drug 01/29/2021   Dyspnea    Gout    Gout    History of radiation therapy 10/22/15 - 12/08/15   left breast 50.4 Gy, boost to 10 Gy   History of radiation therapy    Right breast- 09/22/21-10/30/21- Dr. Antony [name]   Hypertension    Hypothyroidism    Migraine    Multifactorial gait disorder 01/12/2013   Neuromuscular disorder (HCC)    diabetic neuropathy   Neuropathy    Personal history of radiation therapy 2017   Left Breast Cancer   Posterior tibial tendon dysfunction (PTTD) of right  lower extremity 10/09/2019   Sleep apnea    CPAP nightly   Spleen enlarged    Thyroid disease     Family History  Problem Relation Age of Onset   Hypothyroidism Mother    Alzheimer's disease Mother    Diabetes Father    Cancer Sister        GYN cancer   Hypothyroidism Sister    Heart disease Brother    Arthritis Son    Diabetes Son    Arthritis Son    Cancer Maternal Grandmother    Heart failure Maternal Grandfather    Bone cancer Paternal Grandmother    Heart failure Paternal Grandmother     Past Surgical History:  Procedure Laterality Date   ANKLE FUSION Right 2022   BIOPSY  10/19/2022   Procedure: BIOPSY;  Surgeon: Dolores Frame, MD;  Location: [facility];  Service: Gastroenterology;;   BREAST BIOPSY Left 08/04/2015   Procedure: LEFT BREAST BIOPSY WITH NEEDLE LOCALIZATION;  Surgeon: Darnell Level, MD;  Location: [facility];  Service: General;  Laterality: Left;   BREAST BIOPSY Right 06/23/2021   BREAST DUCTAL SYSTEM EXCISION Left 08/04/2015   Procedure: LEFT EXCISION DUCTAL SYSTEM BREAST;  Surgeon: Darnell Level, MD;  Location: [facility];  Service: General;  Laterality: Left;   BREAST LUMPECTOMY Left 08/2015   BREAST LUMPECTOMY Right 08/2021   BREAST LUMPECTOMY WITH AXILLARY LYMPH NODE BIOPSY Left 09/12/2015   Procedure: RE-EXCISION OF LEFT BREAST LUMPECTOMY WITH LEFT AXILLARY LYMPH NODE BIOPSY;  Surgeon: Darnell Level, MD;  Location: [facility];  Service: General;  Laterality: Left;   BREAST LUMPECTOMY WITH RADIOACTIVE SEED LOCALIZATION Right 08/12/2021   Procedure: RIGHT BREAST LUMPECTOMY WITH RADIOACTIVE SEED LOCALIZATION;  Surgeon: Abigail Miyamoto, MD;  Location: [facility];  Service: General;  Laterality: Right;  LMA   CHOLECYSTECTOMY     ESOPHAGOGASTRODUODENOSCOPY (EGD) WITH PROPOFOL N/A 10/19/2022   Procedure: ESOPHAGOGASTRODUODENOSCOPY (EGD) WITH PROPOFOL;  Surgeon: Dolores Frame, MD;   [facility];  Service: Gastroenterology;  Laterality: N/A;  10:30am asa 3   EYE SURGERY Bilateral 2022   Lashes growing backwards into the eye   JOINT REPLACEMENT     left knee   JOINT REPLACEMENT  2017   right knee   REVERSE SHOULDER ARTHROPLASTY Right 09/07/2018   Procedure: RIGHT REVERSE SHOULDER ARTHROPLASTY;  Surgeon: Cammy Copa, MD;  Location: [facility];  Service: Orthopedics;  Laterality: Right;   TOTAL KNEE ARTHROPLASTY Right 02/28/2015   Procedure: RIGHT TOTAL KNEE ARTHROPLASTY;  Surgeon: Kathryne Hitch, MD;  Location: [facility];  Service: Orthopedics;  Laterality: Right;   TUBAL LIGATION     Social History   Occupational History   Occupation: Retired    Comment: Dance movement psychotherapist  Tobacco Use   Smoking status: Never    Passive exposure: Never   Smokeless tobacco: Never  Vaping Use   Vaping status: Never Used  Substance and Sexual Activity   Alcohol use: No   Drug use: No   Sexual activity: Yes    Partners: Male    Birth control/protection: Surgical    Comment: BTL

## 2023-04-11 ENCOUNTER — Other Ambulatory Visit: Payer: Self-pay | Admitting: *Deleted

## 2023-04-11 ENCOUNTER — Ambulatory Visit: Payer: Medicare Other | Admitting: Hematology and Oncology

## 2023-04-11 ENCOUNTER — Other Ambulatory Visit: Payer: Medicare Other

## 2023-04-11 ENCOUNTER — Telehealth: Payer: Self-pay

## 2023-04-11 DIAGNOSIS — Z6841 Body Mass Index (BMI) 40.0 and over, adult: Secondary | ICD-10-CM | POA: Diagnosis not present

## 2023-04-11 DIAGNOSIS — I10 Essential (primary) hypertension: Secondary | ICD-10-CM | POA: Diagnosis not present

## 2023-04-11 DIAGNOSIS — C50412 Malignant neoplasm of upper-outer quadrant of left female breast: Secondary | ICD-10-CM

## 2023-04-11 DIAGNOSIS — Z794 Long term (current) use of insulin: Secondary | ICD-10-CM | POA: Diagnosis not present

## 2023-04-11 DIAGNOSIS — E1142 Type 2 diabetes mellitus with diabetic polyneuropathy: Secondary | ICD-10-CM | POA: Diagnosis not present

## 2023-04-11 NOTE — Progress Notes (Unsigned)
 [facility] Cancer Follow up:    [name] Halon, MD [address] [place] [address]   DIAGNOSIS:  Cancer Staging  Malignant neoplasm of upper-outer quadrant of left breast in female, estrogen receptor positive (HCC) Staging form: Breast, AJCC 7th Edition - Clinical: Stage IA (T1c, N0, M0) - Signed by Lowella Dell, MD on 08/19/2015 - Pathologic: Stage IA (T1c, N0, cM0) - Signed by Loa Socks, NP on 10/26/2021   SUMMARY OF ONCOLOGIC HISTORY: Oncology History  Malignant neoplasm of upper-outer quadrant of left breast in female, estrogen receptor positive (HCC)  02/02/2015 - 02/2021 Anti-estrogen oral therapy   Anastrozole daily, completed 6 years   08/04/2015 Surgery   73 y.o. [name] [name] woman status post left breast upper outer quadrant lumpectomy 08/04/2015 for a pT1c pNX, stage I A invasive ductal carcinoma, estrogen and progesterone receptor positive, HER-2 not amplified, with no HER-2 amplification, anterior margin was focally positive   08/04/2015 Oncotype testing   Oncotype DX score of 12 predicts a 10 year risk of recurrence outside the breast of 8% if the patient's only systemic therapy is tamoxifen for 5 years. It also predicts no benefit from therapy.      09/12/2015 Surgery   additional Left breast surgery 09/12/2015 cleared the margins and found all 5 axillary lymph nodes sampled clear for a final stage pT1c pN0, stage IA   10/22/2015 - 12/08/2015 Radiation Therapy   Adjuvant radiation 10/22/15 - 12/08/15              1) Left breast: 50.4 Gy in 28 fractions              2) Left breast boost: 10 Gy in 5 fractions   10/26/2021 Cancer Staging   Staging form: Breast, AJCC 7th Edition - Pathologic: Stage IA (T1c, N0, cM0) - Signed by Loa Socks, NP on 10/26/2021   Malignant neoplasm of right breast (HCC)  06/10/2021 Mammogram   asymmetry within the OUTER RIGHT breast on CC views warrants further evaluation.   Targeted  ultrasound : showing hypoechoic oval mass with indistinct margins in the 10:30 o'clock location of the RIGHT breast 9 centimeters from nipple measuring 0.5 x 0.4 x 0.4 centimeters. This area is approximately 1.8 centimeters from the palpable bruise, bruise was thought to be fat necrosis.   06/23/2021 Initial Diagnosis   Right breast needle core biopsy IDC, grade one, ER 90% positive, PR 60% positive, Ki67 2%, HER-2 IHC 2+, FISH negative   07/08/2021 Treatment Plan Change   Faslodex every 4 weeks, Abemaciclib to be added    08/12/2021 Surgery   Right lumpectomy: invasive ductal carcinoma with clear margins of resection, final pathology showed tumor measuring 1.1 cm x 0.7 x 8.7,  grade 3   previous biopsy showed ER 90% positive, PR 60% positive, Ki-67 was 2% HER2 2+ by IHC and negative by [facility]   08/12/2021 Oncotype testing   12   09/22/2021 - 10/30/2021 Radiation Therapy   Site Technique Total Dose (Gy) Dose per Fx (Gy) Completed Fx Beam Energies  Breast, Right: Breast_R 3D 45/45 1.8 25/25 15X, 10XFFF  Breast, Right: Breast_R_Bst 3D 6/6 2 3/3 10X, 15X       CURRENT THERAPY: Exemestane  INTERVAL HISTORY: [name] [name] 73 y.o. female returns for f/u of her history of breast cancer while on exemestane.  She is accompanied by her husband.  She has an autoimmune condition affecting her eyes and is receiving Privigen, an intravenous immunoglobulin  therapy, administered once a month for four days. This treatment has led to significant improvement in her symptoms.  She reports a low white blood cell count, which she attributes to her medication, Cellcept. Her dosage was recently reduced from six pills a day to two. Despite the reduction, her white blood cell count remains low, though it is better than before.  She is currently taking Exemestane for breast cancer, which she tolerates well without major issues. She previously experienced joint pain with another medication, but not with Exemestane.  Her last mammogram in July showed no concerning findings.  She has been experiencing frequent falls, approximately four to five times, which [place] be related to vertigo. She has started physical therapy and has a nurse visiting her home once a week for nine weeks. The physical therapy is addressing balance issues, possibly due to neuropathy in her feet, as she is unable to move one of her feet. She is now using a walker instead of a cane.  She has not had a bone density test in a long time due to various health issues and scheduling conflicts.  Patient Active Problem List   Diagnosis Date Noted   Physical deconditioning 03/30/2023   Fall 11/25/2022   COVID-19 virus infection 09/17/2022   Immunosuppressed status (HCC) 09/17/2022   Abnormal CT of the abdomen 08/09/2022   Paraumbilical hernia 07/29/2022   Esophageal varices in cirrhosis (HCC) 07/29/2022   Cirrhosis of liver without ascites (HCC) 07/12/2022   LUQ abdominal pain 07/05/2022   Drug-induced constipation 07/05/2022   Anxiety disorder 08/28/2021   Disorder of intervertebral disc of cervical spine 08/28/2021   Family history of malignant neoplasm of digestive organs 08/28/2021   Malignant neoplasm of right breast (HCC) 07/08/2021   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   Pseudophakia of both eyes 04/01/2021   Retinal edema 04/01/2021   Trichiasis of eyelid of both eyes 04/01/2021   Hypertrophy of inferior nasal turbinate 02/26/2021   Allergic rhinitis due to pollen 01/29/2021   Allergy to influenza vaccine 01/29/2021   Gout 01/29/2021   Hyperlipidemia 01/29/2021   Nonalcoholic steatohepatitis (NASH) 01/29/2021   Primary insomnia 01/29/2021   Recurrent major depression in remission (HCC) 01/29/2021   Arthritis of right ankle 04/14/2020   Nasal septal deviation 03/11/2020   Arthritis of right subtalar joint 10/09/2019   Acquired pes planovalgus of right foot 10/09/2019   Acquired posterior equinus of right lower extremity  10/09/2019   Epistaxis, recurrent 09/26/2019   OSA on CPAP 09/26/2019   Acquired hypothyroidism 05/21/2019   Arthritis of shoulder 09/07/2018   Bilateral hearing loss 08/01/2018   Vertigo 08/01/2018   Chronic nonintractable headache 08/01/2018   Type 2 diabetes mellitus with diabetic polyneuropathy, with long-term current use of insulin (HCC) 11/16/2016   Bilateral lower extremity edema 09/01/2016   Diabetic polyneuropathy associated with type 2 diabetes mellitus (HCC) 06/03/2016   Malignant neoplasm of upper-outer quadrant of left breast in female, estrogen receptor positive (HCC) 08/19/2015   Osteoarthritis of right knee 02/28/2015   Status post total right knee replacement 02/28/2015   Splenomegaly 07/30/2014   Thrombocytopenia (HCC) 07/30/2014   Recurrent falls 01/12/2013   Morbid obesity (HCC) 01/12/2013   Degenerative arthritis of spine 01/12/2013   Essential hypertension 09/05/2012    is allergic to codeine, influenza a (h1n1) monoval pf, hydrocodone-acetaminophen, lisinopril, metformin, pregabalin, and jardiance [empagliflozin].  MEDICAL HISTORY: Past Medical History:  Diagnosis Date   Abdominal wall contusion 12/09/2011   Achilles tendon contracture, bilateral 06/03/2016   Anxiety  Arthritis    Blood dyscrasia    low platelet count- sees [name] , Dr. Nelva Nay ( Note in EPIC from 07/2014) at [facility]   Breast cancer [facility]) 2017   Left Breast   Breast cancer (HCC) 07/2021   right breast IDC   Cancer (HCC)    left breast   Chest wall contusion 12/09/2011   Cirrhosis of liver (HCC)    Cough    Diabetes mellitus without complication (HCC)    Diabetic neuropathy (HCC)    Diarrhea due to drug 01/29/2021   Dyspnea    Gout    Gout    History of radiation therapy 10/22/15 - 12/08/15   left breast 50.4 Gy, boost to 10 Gy   History of radiation therapy    Right breast- 09/22/21-10/30/21- Dr. Antony Blackbird   Hypertension    Hypothyroidism     Migraine    Multifactorial gait disorder 01/12/2013   Neuromuscular disorder (HCC)    diabetic neuropathy   Neuropathy    Personal history of radiation therapy 2017   Left Breast Cancer   Posterior tibial tendon dysfunction (PTTD) of right lower extremity 10/09/2019   Sleep apnea    CPAP nightly   Spleen enlarged    Thyroid disease     SURGICAL HISTORY: Past Surgical History:  Procedure Laterality Date   ANKLE FUSION Right 2022   BIOPSY  10/19/2022   Procedure: BIOPSY;  Surgeon: Dolores Frame, MD;  Location: [facility];  Service: Gastroenterology;;   BREAST BIOPSY Left 08/04/2015   Procedure: LEFT BREAST BIOPSY WITH NEEDLE LOCALIZATION;  Surgeon: Darnell Level, MD;  Location: [facility];  Service: General;  Laterality: Left;   BREAST BIOPSY Right 06/23/2021   BREAST DUCTAL SYSTEM EXCISION Left 08/04/2015   Procedure: LEFT EXCISION DUCTAL SYSTEM BREAST;  Surgeon: Darnell Level, MD;  Location: [facility];  Service: General;  Laterality: Left;   BREAST LUMPECTOMY Left 08/2015   BREAST LUMPECTOMY Right 08/2021   BREAST LUMPECTOMY WITH AXILLARY LYMPH NODE BIOPSY Left 09/12/2015   Procedure: RE-EXCISION OF LEFT BREAST LUMPECTOMY WITH LEFT AXILLARY LYMPH NODE BIOPSY;  Surgeon: Darnell Level, MD;  Location: [facility];  Service: General;  Laterality: Left;   BREAST LUMPECTOMY WITH RADIOACTIVE SEED LOCALIZATION Right 08/12/2021   Procedure: RIGHT BREAST LUMPECTOMY WITH RADIOACTIVE SEED LOCALIZATION;  Surgeon: Abigail Miyamoto, MD;  Location: [facility];  Service: General;  Laterality: Right;  LMA   CHOLECYSTECTOMY     ESOPHAGOGASTRODUODENOSCOPY (EGD) WITH PROPOFOL N/A 10/19/2022   Procedure: ESOPHAGOGASTRODUODENOSCOPY (EGD) WITH PROPOFOL;  Surgeon: Dolores Frame, MD;  Location: [facility];  Service: Gastroenterology;  Laterality: N/A;  10:30am asa 3   EYE SURGERY Bilateral 2022   Lashes growing backwards into  the eye   JOINT REPLACEMENT     left knee   JOINT REPLACEMENT  2017   right knee   REVERSE SHOULDER ARTHROPLASTY Right 09/07/2018   Procedure: RIGHT REVERSE SHOULDER ARTHROPLASTY;  Surgeon: Cammy Copa, MD;  Location: [facility];  Service: Orthopedics;  Laterality: Right;   TOTAL KNEE ARTHROPLASTY Right 02/28/2015   Procedure: RIGHT TOTAL KNEE ARTHROPLASTY;  Surgeon: Kathryne Hitch, MD;  Location: [facility];  Service: Orthopedics;  Laterality: Right;   TUBAL LIGATION      SOCIAL HISTORY: Social History   Socioeconomic History   Marital status: Married    Spouse name: [name] Horger   Number of children: 3   Years of education: 12th  Highest education level: 12th grade  Occupational History   Occupation: Retired    Comment: Dance movement psychotherapist  Tobacco Use   Smoking status: Never    Passive exposure: Never   Smokeless tobacco: Never  Vaping Use   Vaping status: Never Used  Substance and Sexual Activity   Alcohol use: No   Drug use: No   Sexual activity: Yes    Partners: Male    Birth control/protection: Surgical    Comment: BTL  Other Topics Concern   Not on file  Social History Narrative   Not on file   Social Drivers of Health   Financial Resource Strain: Low Risk  (03/16/2023)   Overall Financial Resource Strain (CARDIA)    Difficulty of Paying Living Expenses: Not hard at all  Food Insecurity: No Food Insecurity (03/16/2023)   Hunger Vital Sign    Worried About Running Out of Food in the Last Year: Never true    Ran Out of Food in the Last Year: Never true  Transportation Needs: No Transportation Needs (03/16/2023)   PRAPARE - Administrator, Civil Service (Medical): No    Lack of Transportation (Non-Medical): No  Physical Activity: Sufficiently Active (03/16/2023)   Exercise Vital Sign    Days of Exercise per Week: 5 days    Minutes of Exercise per Session: 50 min  Stress: No Stress Concern Present (03/16/2023)   [employer] of  Occupational Health - Occupational Stress Questionnaire    Feeling of Stress : Only a little  Social Connections: Socially Integrated (03/16/2023)   Social Connection and Isolation Panel [NHANES]    Frequency of Communication with Friends and Family: More than three times a week    Frequency of Social Gatherings with Friends and Family: More than three times a week    Attends Religious Services: More than 4 times per year    Active Member of [employer] or Organizations: Yes    Attends Engineer, structural: More than 4 times per year    Marital Status: Married  Catering manager Violence: Not At Risk (03/16/2023)   Humiliation, Afraid, Rape, and Kick questionnaire    Fear of Current or Ex-Partner: No    Emotionally Abused: No    Physically Abused: No    Sexually Abused: No    FAMILY HISTORY: Family History  Problem Relation Age of Onset   Hypothyroidism Mother    Alzheimer's disease Mother    Diabetes Father    Cancer Sister        GYN cancer   Hypothyroidism Sister    Heart disease Brother    Arthritis Son    Diabetes Son    Arthritis Son    Cancer Maternal Grandmother    Heart failure Maternal Grandfather    Bone cancer Paternal Grandmother    Heart failure Paternal Grandmother     Review of Systems  Constitutional:  Negative for appetite change, chills, fatigue, fever and unexpected weight change.  HENT:   Negative for hearing loss, lump/mass and trouble swallowing.   Eyes:  Negative for eye problems and icterus.  Respiratory:  Negative for chest tightness, cough and shortness of breath.   Cardiovascular:  Negative for chest pain, leg swelling and palpitations.  Gastrointestinal:  Negative for abdominal distention, abdominal pain, constipation, diarrhea, nausea and vomiting.  Endocrine: Negative for hot flashes.  Genitourinary:  Negative for difficulty urinating.   Musculoskeletal:  Negative for arthralgias.  Skin:  Negative for itching and rash.  Neurological:  Negative for dizziness, extremity weakness, headaches and numbness.  Hematological:  Negative for adenopathy. Does not bruise/bleed easily.  Psychiatric/Behavioral:  Negative for depression. The patient is not nervous/anxious.       PHYSICAL EXAMINATION     Vitals:   04/12/23 0957  BP: (!) 154/65  Pulse: 88  Resp: 18  Temp: 98.7 F (37.1 C)  SpO2: 97%     Physical Exam Constitutional:      General: She is not in acute distress.    Appearance: Normal appearance. She is not toxic-appearing.  HENT:     Head: Normocephalic and atraumatic.     Mouth/Throat:     Mouth: Mucous membranes are moist.     Pharynx: Oropharynx is clear. No oropharyngeal exudate or posterior oropharyngeal erythema.  Eyes:     General: No scleral icterus. Cardiovascular:     Rate and Rhythm: Normal rate and regular rhythm.     Pulses: Normal pulses.     Heart sounds: Normal heart sounds.  Pulmonary:     Effort: Pulmonary effort is normal.     Breath sounds: Normal breath sounds.  Chest:     Comments: Bilateral breasts inspected and palpated. Right breast seroma near lumpectomy scar. Left breast post surgical changes No palpable adenopathy Abdominal:     General: Abdomen is flat. Bowel sounds are normal. There is no distension.     Palpations: Abdomen is soft.     Tenderness: There is no abdominal tenderness.  Musculoskeletal:        General: No swelling.     Cervical back: Neck supple.  Lymphadenopathy:     Cervical: No cervical adenopathy.  Skin:    General: Skin is warm and dry.     Findings: No rash.  Neurological:     General: No focal deficit present.     Mental Status: She is alert.  Psychiatric:        Mood and Affect: Mood normal.        Behavior: Behavior normal.      ASSESSMENT and THERAPY PLAN:   Malignant neoplasm of upper-outer quadrant of left breast in female, estrogen receptor positive (HCC)  ASSESSMENT: 73 y.o. [name] status post left breast upper  outer quadrant lumpectomy 08/04/2015 for a pT1c pNX, stage I A invasive ductal carcinoma, estrogen and progesterone receptor positive, HER-2 not amplified, with no HER-2 amplification.             (a) anterior margin was focally positive   (1) additional Left breast surgery 09/12/2015 cleared the margins and found all 5 axillary lymph nodes sampled clear for a final stage pT1c pN0, stage IA   (2) Oncotype DX score of 12 predicts a 10 year risk of recurrence outside the breast of 8% if the patient's only systemic therapy is tamoxifen for 5 years. It also predicts no benefit from therapy.    (3) Adjuvant radiation 10/22/15 - 12/08/15              1) Left breast: 50.4 Gy in 28 fractions              2) Left breast boost: 10 Gy in 5 fractions   (4) started  anastrozole 02/02/2015, completed 6 yrs.   (5) She has mammogram recently 06/20/2021, possible asymmetry within the OUTER RIGHT breast on CC views warrants further evaluation. Targeted ultrasound is performed, showing hypoechoic oval mass with indistinct margins in the 10:30 o'clock location of the RIGHT breast 9 centimeters from nipple measuring  0.5 x 0.4 x 0.4 centimeters. This area is approximately 1.8 centimeters from the palpable bruise, bruise was thought to be fat necrosis.   Right breast lumpectomy showed invasive ductal carcinoma with clear margins of resection, final pathology showed tumor measuring 1.1 cm x 0.7 x 8.7 grade 3 previous biopsy showed ER 90% positive, PR 60% positive, Ki-67 was 2% HER2 2+ by IHC and negative by FISH       Autoimmune eye disease Receiving monthly Privigen therapy, effectively managing symptoms. - Continue Privigen therapy as scheduled.  Leukopenia Likely due to Cellcept therapy. Dosage reduced; white blood cell count expected to improve. - Monitor white blood cell count with follow-up blood work in one to two weeks.  Breast cancer On Exemestane, tolerating well. Last mammogram showed no concerning  findings. - Continue Exemestane therapy. - Schedule follow-up in six months.  Neuropathy and balance issues Experiencing falls due to vertigo and neuropathy. Transitioning to a walker. - Continue physical therapy. - Utilize a walker for mobility support.  Osteoporosis screening Bone density test overdue. Important to reschedule. - Provide central scheduling phone number to reschedule bone density test in one to two months.     All questions were answered. The patient knows to call the clinic with any problems, questions or concerns. We can certainly see the patient much sooner if necessary.  Total encounter time:30 minutes*in face-to-face visit time, chart review, lab review, care coordination, order entry, and documentation of the encounter time.   *Total Encounter Time as defined by the Centers for Medicare and Medicaid Services includes, in addition to the face-to-face time of a patient visit (documented in the note above) non-face-to-face time: obtaining and reviewing outside history, ordering and reviewing medications, tests or procedures, care coordination (communications with other health care professionals or caregivers) and documentation in the medical record.

## 2023-04-11 NOTE — Telephone Encounter (Signed)
 Spoke with patient and confirmed appointment on 04/12/23

## 2023-04-12 ENCOUNTER — Inpatient Hospital Stay (HOSPITAL_BASED_OUTPATIENT_CLINIC_OR_DEPARTMENT_OTHER): Payer: Medicare Other | Admitting: Hematology and Oncology

## 2023-04-12 ENCOUNTER — Inpatient Hospital Stay: Payer: Medicare Other | Attending: Hematology and Oncology

## 2023-04-12 VITALS — BP 154/65 | HR 88 | Temp 98.7°F | Resp 18 | Wt 252.0 lb

## 2023-04-12 DIAGNOSIS — C50412 Malignant neoplasm of upper-outer quadrant of left female breast: Secondary | ICD-10-CM | POA: Diagnosis not present

## 2023-04-12 DIAGNOSIS — Z170 Estrogen receptor positive status [ER+]: Secondary | ICD-10-CM

## 2023-04-12 DIAGNOSIS — Z780 Asymptomatic menopausal state: Secondary | ICD-10-CM | POA: Diagnosis not present

## 2023-04-12 DIAGNOSIS — Z79811 Long term (current) use of aromatase inhibitors: Secondary | ICD-10-CM | POA: Diagnosis not present

## 2023-04-12 DIAGNOSIS — G629 Polyneuropathy, unspecified: Secondary | ICD-10-CM | POA: Insufficient documentation

## 2023-04-12 DIAGNOSIS — Z8049 Family history of malignant neoplasm of other genital organs: Secondary | ICD-10-CM | POA: Diagnosis not present

## 2023-04-12 DIAGNOSIS — D72819 Decreased white blood cell count, unspecified: Secondary | ICD-10-CM | POA: Insufficient documentation

## 2023-04-12 DIAGNOSIS — Z1732 Human epidermal growth factor receptor 2 negative status: Secondary | ICD-10-CM | POA: Insufficient documentation

## 2023-04-12 DIAGNOSIS — Z923 Personal history of irradiation: Secondary | ICD-10-CM | POA: Insufficient documentation

## 2023-04-12 DIAGNOSIS — Z13820 Encounter for screening for osteoporosis: Secondary | ICD-10-CM | POA: Diagnosis not present

## 2023-04-12 DIAGNOSIS — Z808 Family history of malignant neoplasm of other organs or systems: Secondary | ICD-10-CM | POA: Diagnosis not present

## 2023-04-12 DIAGNOSIS — Z1721 Progesterone receptor positive status: Secondary | ICD-10-CM | POA: Insufficient documentation

## 2023-04-12 LAB — CBC WITH DIFFERENTIAL (CANCER CENTER ONLY)
Abs Immature Granulocytes: 0 10*3/uL (ref 0.00–0.07)
Basophils Absolute: 0 10*3/uL (ref 0.0–0.1)
Basophils Relative: 1 %
Eosinophils Absolute: 0.1 10*3/uL (ref 0.0–0.5)
Eosinophils Relative: 5 %
HCT: 36.8 % (ref 36.0–46.0)
Hemoglobin: 11.6 g/dL — ABNORMAL LOW (ref 12.0–15.0)
Immature Granulocytes: 0 %
Lymphocytes Relative: 28 %
Lymphs Abs: 0.5 10*3/uL — ABNORMAL LOW (ref 0.7–4.0)
MCH: 28.3 pg (ref 26.0–34.0)
MCHC: 31.5 g/dL (ref 30.0–36.0)
MCV: 89.8 fL (ref 80.0–100.0)
Monocytes Absolute: 0.2 10*3/uL (ref 0.1–1.0)
Monocytes Relative: 9 %
Neutro Abs: 1 10*3/uL — ABNORMAL LOW (ref 1.7–7.7)
Neutrophils Relative %: 57 %
Platelet Count: 63 10*3/uL — ABNORMAL LOW (ref 150–400)
RBC: 4.1 MIL/uL (ref 3.87–5.11)
RDW: 14.6 % (ref 11.5–15.5)
WBC Count: 1.8 10*3/uL — ABNORMAL LOW (ref 4.0–10.5)
nRBC: 0 % (ref 0.0–0.2)

## 2023-04-12 LAB — CMP (CANCER CENTER ONLY)
ALT: 10 U/L (ref 0–44)
AST: 24 U/L (ref 15–41)
Albumin: 3.4 g/dL — ABNORMAL LOW (ref 3.5–5.0)
Alkaline Phosphatase: 139 U/L — ABNORMAL HIGH (ref 38–126)
Anion gap: 6 (ref 5–15)
BUN: 14 mg/dL (ref 8–23)
CO2: 26 mmol/L (ref 22–32)
Calcium: 9.5 mg/dL (ref 8.9–10.3)
Chloride: 107 mmol/L (ref 98–111)
Creatinine: 0.85 mg/dL (ref 0.44–1.00)
GFR, Estimated: >60 mL/min (ref >60)
Glucose, Bld: 228 mg/dL — ABNORMAL HIGH (ref 70–99)
Potassium: 3.6 mmol/L (ref 3.5–5.1)
Sodium: 139 mmol/L (ref 135–145)
Total Bilirubin: 0.8 mg/dL (ref 0.0–1.2)
Total Protein: 7 g/dL (ref 6.5–8.1)

## 2023-04-12 NOTE — Assessment & Plan Note (Addendum)
  ASSESSMENT: 73 y.o. [name] status post left breast upper outer quadrant lumpectomy 08/04/2015 for a pT1c pNX, stage I A invasive ductal carcinoma, estrogen and progesterone receptor positive, HER-2 not amplified, with no HER-2 amplification.             (a) anterior margin was focally positive   (1) additional Left breast surgery 09/12/2015 cleared the margins and found all 5 axillary lymph nodes sampled clear for a final stage pT1c pN0, stage IA   (2) Oncotype DX score of 12 predicts a 10 year risk of recurrence outside the breast of 8% if the patient's only systemic therapy is tamoxifen for 5 years. It also predicts no benefit from therapy.    (3) Adjuvant radiation 10/22/15 - 12/08/15              1) Left breast: 50.4 Gy in 28 fractions              2) Left breast boost: 10 Gy in 5 fractions   (4) started  anastrozole 02/02/2015, completed 6 yrs.   (5) She has mammogram recently 06/20/2021, possible asymmetry within the OUTER RIGHT breast on CC views warrants further evaluation. Targeted ultrasound is performed, showing hypoechoic oval mass with indistinct margins in the 10:30 o'clock location of the RIGHT breast 9 centimeters from nipple measuring 0.5 x 0.4 x 0.4 centimeters. This area is approximately 1.8 centimeters from the palpable bruise, bruise was thought to be fat necrosis.   Right breast lumpectomy showed invasive ductal carcinoma with clear margins of resection, final pathology showed tumor measuring 1.1 cm x 0.7 x 8.7 grade 3 previous biopsy showed ER 90% positive, PR 60% positive, Ki-67 was 2% HER2 2+ by IHC and negative by FISH       Autoimmune eye disease Receiving monthly Privigen therapy, effectively managing symptoms. - Continue Privigen therapy as scheduled.  Leukopenia Likely due to Cellcept therapy. Dosage reduced; white blood cell count expected to improve. - Monitor white blood cell count with follow-up blood work in one to two weeks.  Breast  cancer On Exemestane, tolerating well. Last mammogram showed no concerning findings. - Continue Exemestane therapy. - Schedule follow-up in six months.  Neuropathy and balance issues Experiencing falls due to vertigo and neuropathy. Transitioning to a walker. - Continue physical therapy. - Utilize a walker for mobility support.  Osteoporosis screening Bone density test overdue. Important to reschedule. - Provide central scheduling phone number to reschedule bone density test in one to two months.

## 2023-04-18 ENCOUNTER — Other Ambulatory Visit (INDEPENDENT_AMBULATORY_CARE_PROVIDER_SITE_OTHER): Payer: Self-pay | Admitting: Gastroenterology

## 2023-04-18 ENCOUNTER — Other Ambulatory Visit: Payer: Self-pay | Admitting: Physician Assistant

## 2023-04-18 ENCOUNTER — Telehealth: Payer: Self-pay

## 2023-04-18 ENCOUNTER — Ambulatory Visit: Payer: Self-pay | Admitting: *Deleted

## 2023-04-18 DIAGNOSIS — E1142 Type 2 diabetes mellitus with diabetic polyneuropathy: Secondary | ICD-10-CM | POA: Diagnosis not present

## 2023-04-18 DIAGNOSIS — E559 Vitamin D deficiency, unspecified: Secondary | ICD-10-CM | POA: Diagnosis not present

## 2023-04-18 DIAGNOSIS — K7460 Unspecified cirrhosis of liver: Secondary | ICD-10-CM | POA: Diagnosis not present

## 2023-04-18 DIAGNOSIS — I10 Essential (primary) hypertension: Secondary | ICD-10-CM | POA: Diagnosis not present

## 2023-04-18 DIAGNOSIS — E11649 Type 2 diabetes mellitus with hypoglycemia without coma: Secondary | ICD-10-CM | POA: Diagnosis not present

## 2023-04-18 DIAGNOSIS — E118 Type 2 diabetes mellitus with unspecified complications: Secondary | ICD-10-CM | POA: Diagnosis not present

## 2023-04-18 DIAGNOSIS — Z8616 Personal history of COVID-19: Secondary | ICD-10-CM | POA: Diagnosis not present

## 2023-04-18 MED ORDER — TRAMADOL HCL 50 MG PO TABS: 50.0000 mg | ORAL_TABLET | Freq: Four times a day (QID) | ORAL | 0 refills | Status: DC | PRN

## 2023-04-18 NOTE — Patient Outreach (Signed)
 Care Coordination   Follow Up Visit Note   04/18/2023  Name: [name] [name] MRN: [id number] DOB: [birth year]  [name] [name] is a 73 y.o. year old female who sees Anabel Halon, MD for primary care. I spoke with Willaim Sheng A Fallen by phone today.  What matters to the patients health and wellness today?  Receive Help in The Home.   Goals Addressed               This Visit's Progress     COMPLETED: Receive Help in The Home. (pt-stated)   On track     Care Coordination Interventions:  Interventions Today    Flowsheet Row Most Recent Value  Chronic Disease   Chronic disease during today's visit Diabetes, Hypertension (HTN), Other  [Recurrent Falls, Recurrent Major Depression, Morbid Obesity, Anxiety Disorder, Bilateral Lower Extremity Edema, Nonalcoholic Steatohepatitis, Cirrhosis of Liver without Acites, Degenerative Arthritis of Spine, Arthritis of Shoulder, Gout, Polyneuropathy.]  General Interventions   General Interventions Discussed/Reviewed General Interventions Discussed, General Interventions Reviewed, Doctor Visits, Communication with, Community Resources  [Encouraged Routine Engagement with Care Team Members & Providers.]  Doctor Visits Discussed/Reviewed Doctor Visits Discussed, Specialist, Doctor Visits Reviewed, Annual Wellness Visits, PCP  [Encouraged Routine Engagement with Care Team Members & Providers.]  PCP/Specialist Visits Compliance with follow-up visit  [Encouraged Routine Engagement with Care Team Members & Providers.]  Communication with PCP/Specialists, Charity fundraiser, Pharmacists, Social Work  [employer] Routine Engagement with Care Team Members & Providers.]  Level of Care Adult Daycare, Assisted Living, Skilled Nursing Facility  [Confirmed Disinterest in Enrollment in Adult Day Care Program. Confirmed Disinterest in Receiving Assistance Pursuing Higher Level of Care Placement Options (I.e Assisted Living Versus Skilled Nursing Facility).]  Applications Medicaid, Personal Care  Services  [Confirmed Disinterest in Applying for Medicaid or Personal Care Services.]  Education Interventions   Education Provided Provided Education  [Thoroughly Reviewed Educational Material to Entertain Questions & Ensure Understanding.]  Provided Verbal Education On Mental Health/Coping with Illness, When to see the doctor, Community Resources  [Encouraged Continued Independent Review of Educational Material Provided.]  Ship broker, Personal Care Services  [Confirmed Disinterest in Applying for [employer] or Personal Care Services.]  Mental Health Interventions   Mental Health Discussed/Reviewed Mental Health Discussed, Anxiety, Depression, Grief and Loss, Mental Health Reviewed, Substance Abuse, Coping Strategies, Suicide, Crisis, Other  [Assessed Mental Health & Cognitive Status.]  Safety Interventions   Safety Discussed/Reviewed Safety Discussed, Safety Reviewed  [Encouraged Routine Use of Assistive Devices & Durable Medical Equipment.]  Home Safety Assistive Devices  [Encouraged Consideration of Home Safety Evaluation.]  Advanced Directive Interventions   Advanced Directives Discussed/Reviewed Advanced Directives Discussed, Advanced Directives Reviewed  [Encouraged Initiation of Advanced Directives (Living Will & Healthcare Power of Corporate treasurer), Offering to [employer], Assist with Completion, Make Copies & Scan into Electronic Medical Record in Epic.]      Active Listening & Reflection Utilized.  Verbalization of Feelings Encouraged.  Emotional Support Provided. Cognitive Behavioral Therapy Initiated. Acceptance & Commitment Therapy Performed. Client-Centered Therapy Indicated. Encouraged Engagement with Danford Bad, Licensed Clinical Social Worker with [facility], [facility] [phone]), if You Have Questions, Need Assistance, Additional Social Work Needs Are Identified in The Near Future, or If You Change Your Mind About  Wanting to Receive Social Work Services.      SDOH assessments and interventions completed:  Yes.  Care Coordination Interventions:  Yes, provided.   Follow up plan: No further intervention required.   Encounter Outcome:  Patient Visit Completed.   Danford Bad, BSW, MSW, LCSW [facility], [facility] Clinical Social Worker II Direct Dial: [phone]  Fax: [phone] Website: [email or web address]

## 2023-04-18 NOTE — Telephone Encounter (Signed)
 Patient called, said her right leg is hurting so bad from the hip down to the foot.  SHe has the fusion in the ankle on that side.  Bronson Curb saw her and xrayed her right knee recently.  She said she could not sleep, pain meds did not help, and can hardly wtb on it today.  Please advise. Call back is  [phone]

## 2023-04-18 NOTE — Patient Instructions (Signed)
 Visit Information  Thank you for taking time to visit with me today. Please don't hesitate to contact me if I can be of assistance to you.   Following are the goals we discussed today:   Goals Addressed               This Visit's Progress     COMPLETED: Receive Help in The Home. (pt-stated)   On track     Care Coordination Interventions:  Interventions Today    Flowsheet Row Most Recent Value  Chronic Disease   Chronic disease during today's visit Diabetes, Hypertension (HTN), Other  [Recurrent Falls, Recurrent Major Depression, Morbid Obesity, Anxiety Disorder, Bilateral Lower Extremity Edema, Nonalcoholic Steatohepatitis, Cirrhosis of Liver without Acites, Degenerative Arthritis of Spine, Arthritis of Shoulder, Gout, Polyneuropathy.]  General Interventions   General Interventions Discussed/Reviewed General Interventions Discussed, General Interventions Reviewed, Doctor Visits, Communication with, Community Resources  [Encouraged Routine Engagement with Care Team Members & Providers.]  Doctor Visits Discussed/Reviewed Doctor Visits Discussed, Specialist, Doctor Visits Reviewed, Annual Wellness Visits, PCP  [Encouraged Routine Engagement with Care Team Members & Providers.]  PCP/Specialist Visits Compliance with follow-up visit  [Encouraged Routine Engagement with Care Team Members & Providers.]  Communication with PCP/Specialists, Charity fundraiser, Pharmacists, Social Work  [employer] Routine Engagement with Care Team Members & Providers.]  Level of Care Adult Daycare, Assisted Living, Skilled Nursing Facility  [Confirmed Disinterest in Enrollment in Adult Day Care Program. Confirmed Disinterest in Receiving Assistance Pursuing Higher Level of Care Placement Options (I.e Assisted Living Versus Skilled Nursing Facility).]  Applications Medicaid, Personal Care Services  [Confirmed Disinterest in Applying for Medicaid or Personal Care Services.]  Education Interventions   Education Provided Provided  Education  [Thoroughly Reviewed Educational Material to Entertain Questions & Ensure Understanding.]  Provided Verbal Education On Mental Health/Coping with Illness, When to see the doctor, Community Resources  [Encouraged Continued Independent Review of Educational Material Provided.]  Ship broker, Personal Care Services  [Confirmed Disinterest in Applying for [employer] or Personal Care Services.]  Mental Health Interventions   Mental Health Discussed/Reviewed Mental Health Discussed, Anxiety, Depression, Grief and Loss, Mental Health Reviewed, Substance Abuse, Coping Strategies, Suicide, Crisis, Other  [Assessed Mental Health & Cognitive Status.]  Safety Interventions   Safety Discussed/Reviewed Safety Discussed, Safety Reviewed  [Encouraged Routine Use of Assistive Devices & Durable Medical Equipment.]  Home Safety Assistive Devices  [Encouraged Consideration of Home Safety Evaluation.]  Advanced Directive Interventions   Advanced Directives Discussed/Reviewed Advanced Directives Discussed, Advanced Directives Reviewed  [Encouraged Initiation of Advanced Directives (Living Will & Healthcare Power of Corporate treasurer), Offering to [employer], Assist with Completion, Make Copies & Scan into Electronic Medical Record in Epic.]      Active Listening & Reflection Utilized.  Verbalization of Feelings Encouraged.  Emotional Support Provided. Cognitive Behavioral Therapy Initiated. Acceptance & Commitment Therapy Performed. Client-Centered Therapy Indicated. Encouraged Engagement with Danford Bad, Licensed Clinical Social Worker with [facility] [phone]), if You Have Questions, Need Assistance, Additional Social Work Needs Are Identified in The Near Future, or If You Change Your Mind About Wanting to Receive Social Work Services.      Please call the care guide team at [phone] if you need to cancel or reschedule your  appointment.   If you are experiencing a Mental Health or Behavioral Health Crisis or need someone to talk to, please call the Suicide and Crisis Lifeline: 988 call the [place] National Suicide Prevention Lifeline: 530 577 4305 or TTY: (310) 343-6095 TTY 302-182-6285)  to talk to a trained counselor call 1-800-273-TALK (toll free, 24 hour hotline) go to [facility] [address] [place] [phone]) call the [facility] Crisis Line: [phone] call 911  Patient verbalizes understanding of instructions and care plan provided today and agrees to view in MyChart. Active MyChart status and patient understanding of how to access instructions and care plan via MyChart confirmed with patient.     No further follow up required.  Danford Bad, BSW, MSW, LCSW [facility], [facility] Clinical Social Worker II Direct Dial: [phone]  Fax: [phone] Website: [email or web address]

## 2023-04-22 ENCOUNTER — Ambulatory Visit: Payer: Self-pay

## 2023-04-22 DIAGNOSIS — I10 Essential (primary) hypertension: Secondary | ICD-10-CM | POA: Diagnosis not present

## 2023-04-22 DIAGNOSIS — C50412 Malignant neoplasm of upper-outer quadrant of left female breast: Secondary | ICD-10-CM | POA: Diagnosis not present

## 2023-04-22 DIAGNOSIS — Z6841 Body Mass Index (BMI) 40.0 and over, adult: Secondary | ICD-10-CM | POA: Diagnosis not present

## 2023-04-22 DIAGNOSIS — E1142 Type 2 diabetes mellitus with diabetic polyneuropathy: Secondary | ICD-10-CM | POA: Diagnosis not present

## 2023-04-22 DIAGNOSIS — Z794 Long term (current) use of insulin: Secondary | ICD-10-CM | POA: Diagnosis not present

## 2023-04-22 NOTE — Telephone Encounter (Signed)
  Chief Complaint: fall Symptoms: BLE severe pain, right ankle swelling Frequency: fall occurred Tuesday Pertinent Negatives: Patient denies N/A. Disposition: [x] ED /[] Urgent Care (no appt availability in office) / [] Appointment(In office/virtual)/ []  [facility]/ [] Home Care/ [] Refused Recommended Disposition /[] [facility]/ []  Follow-up with PCP Additional Notes: Home Health physical therapy assistant, Ben, on the line calling in for triage. Patient had a mechanical fall on Tuesday, c/o BLE pain and hit her head. Her husband had to assist her up from the fall.He states patient is alert and oriented, pupils round/reactive to light/equal. PT assistant states she was able to stand for 60 second holds and do exercises. Now due to the pain she can barely bear weight to go to the bathroom. Advised ED or [facility] for injuries.  Copied from CRM [id number]. Topic: Clinical - Red Word Triage >> Apr 22, 2023 12:11 PM Shon Hale wrote: Red Word that prompted transfer to Nurse Triage: Fall Saturday Reason for Disposition  Injury (or injuries) that need emergency care  Answer Assessment - Initial Assessment Questions 1. MECHANISM: "How did the fall happen?"     She was going to retrieve an item off kitchen table, she took her arm off her walker and lost balance . She went backwards and fell.  2. DOMESTIC VIOLENCE AND ELDER ABUSE SCREENING: "Did you fall because someone pushed you or tried to hurt you?" If Yes, ask: "Are you safe now?"     Denies.  3. ONSET: "When did the fall happen?" (e.g., minutes, hours, or days ago)     Tuesday.  4. LOCATION: "What part of the body hit the ground?" (e.g., back, buttocks, head, hips, knees, hands, head, stomach)     Landed on her buttocks first, went backwards and hit her head.  5. INJURY: "Did you hurt (injure) yourself when you fell?" If Yes, ask: "What did you injure? Tell me more about this?" (e.g., body area; type of injury;  pain severity)"     She complains complains of bilateral lower extremity pain from knees down to ankles.   6. PAIN: "Is there any pain?" If Yes, ask: "How bad is the pain?" (e.g., Scale 1-10; or mild,  moderate, severe)   - NONE (0): No pain   - MILD (1-3): Doesn't interfere with normal activities    - MODERATE (4-7): Interferes with normal activities or awakens from sleep    - SEVERE (8-10): Excruciating pain, unable to do any normal activities      10/10.  7. SIZE: For cuts, bruises, or swelling, ask: "How large is it?" (e.g., inches or centimeters)      Right ankle swelling, mild/Grade 1.  8. PREGNANCY: "Is there any chance you are pregnant?" "When was your last menstrual period?"     N/A.  9. OTHER SYMPTOMS: "Do you have any other symptoms?" (e.g., dizziness, fever, weakness; new onset or worsening).      Left foot drop/weakness prior to fall.  10. CAUSE: "What do you think caused the fall (or falling)?" (e.g., tripped, dizzy spell)       Loss of balance/mechanical after letting go of walker.  Protocols used: Falls and [facility]

## 2023-04-25 ENCOUNTER — Other Ambulatory Visit: Payer: Self-pay

## 2023-04-25 DIAGNOSIS — C50412 Malignant neoplasm of upper-outer quadrant of left female breast: Secondary | ICD-10-CM | POA: Diagnosis not present

## 2023-04-25 DIAGNOSIS — Z6841 Body Mass Index (BMI) 40.0 and over, adult: Secondary | ICD-10-CM | POA: Diagnosis not present

## 2023-04-25 DIAGNOSIS — Z794 Long term (current) use of insulin: Secondary | ICD-10-CM | POA: Diagnosis not present

## 2023-04-25 DIAGNOSIS — I10 Essential (primary) hypertension: Secondary | ICD-10-CM | POA: Diagnosis not present

## 2023-04-25 DIAGNOSIS — E1142 Type 2 diabetes mellitus with diabetic polyneuropathy: Secondary | ICD-10-CM | POA: Diagnosis not present

## 2023-04-26 ENCOUNTER — Other Ambulatory Visit (INDEPENDENT_AMBULATORY_CARE_PROVIDER_SITE_OTHER): Payer: Self-pay

## 2023-04-26 ENCOUNTER — Ambulatory Visit (INDEPENDENT_AMBULATORY_CARE_PROVIDER_SITE_OTHER): Admitting: Orthopaedic Surgery

## 2023-04-26 ENCOUNTER — Other Ambulatory Visit: Payer: Self-pay

## 2023-04-26 ENCOUNTER — Other Ambulatory Visit (INDEPENDENT_AMBULATORY_CARE_PROVIDER_SITE_OTHER)

## 2023-04-26 ENCOUNTER — Encounter: Payer: Self-pay | Admitting: Orthopaedic Surgery

## 2023-04-26 VITALS — BP 147/68 | HR 96

## 2023-04-26 DIAGNOSIS — M25571 Pain in right ankle and joints of right foot: Secondary | ICD-10-CM

## 2023-04-26 DIAGNOSIS — G8929 Other chronic pain: Secondary | ICD-10-CM | POA: Diagnosis not present

## 2023-04-26 DIAGNOSIS — M25572 Pain in left ankle and joints of left foot: Secondary | ICD-10-CM

## 2023-04-26 DIAGNOSIS — M545 Low back pain, unspecified: Secondary | ICD-10-CM | POA: Diagnosis not present

## 2023-04-26 MED ORDER — OZEMPIC (1 MG/DOSE) 4 MG/3ML ~~LOC~~ SOPN
1.0000 mg | PEN_INJECTOR | SUBCUTANEOUS | 5 refills | Status: AC
  Filled 2023-04-26: qty 3, 28d supply, fill #0
  Filled 2023-07-25: qty 3, 28d supply, fill #1
  Filled 2023-08-22: qty 3, 28d supply, fill #2
  Filled 2023-10-07: qty 3, 28d supply, fill #3

## 2023-04-26 NOTE — Progress Notes (Unsigned)
 Office Visit Note   Patient: [name] [name]           Date of Birth: [birth year]           MRN: [id number] Visit Date: 04/26/2023              Requested by: Anabel Halon, MD [address] [place],  [place] [address] PCP: Anabel Halon, MD   Assessment & Plan: Visit Diagnoses:  1. Acute bilateral ankle pain   2. Chronic bilateral low back pain, unspecified whether sciatica present     Plan: X-rays reviewed her ankle fusion is solid.  She has chronic changes in her opposite ankle.  Patient is severe shoulder arthritis.  In addition she has previous abdominal CT that showed suggestion of severe lumbar stenosis multilevel.  She has neuropathy weakness and has not been walking in several years.  Would recommend she get her ear crystal problem treated first so she is not dizzy this will decrease her following chance.  She could continue to work on lower extremity strengthening and this got significantly improved then she could consider back imaging studies but at this point if she has not walked in more than a year the likelihood that lumbar decompression would significantly improve her ambulation potential with her weakness is low.  She can follow-up as needed.  Images and x-rays were reviewed and discussed in detail.  Follow-Up Instructions: No follow-ups on file.   Orders:  Orders Placed This Encounter  Procedures   XR Lumbar Spine 2-3 Views   XR Ankle Complete Left   XR Ankle Complete Right   No orders of the defined types were placed in this encounter.     Procedures: No procedures performed   Clinical Data: No additional findings.   Subjective: Chief Complaint  Patient presents with   Right Ankle - Pain   Left Ankle - Pain   Lower Back - Pain    HPI 73 year old female ankle fusion fell last week with her right leg under and is concerned that she [name] have injured her ankle fusion.  Surgery was done at [facility] about 6 years ago.  She has had some swelling in her  right leg chronically unchanged since the fall.  She has had some left leg pain.  She has weakness in his able to stand just for several seconds and then has to sit down.  She has had total knee arthroplasty.  Patient is unable to walk much she is dizzy and states when she turns or tilts her head she feels like she is falling.  Past history of crystals in her ear and at 1 point she went through manipulation which gave her significant improvement in her symptoms.  Review of Systems positive for shoulder arthritis syptomatic right ankle fusion type 2 diabetes with neuropathy on insulin.Hx   Breast cancer.   Objective: Vital Signs: BP (!) 147/68   Pulse 96   Physical Exam Constitutional:      Appearance: She is well-developed.  HENT:     Head: Normocephalic.     Right Ear: External ear normal.     Left Ear: External ear normal. There is no impacted cerumen.  Eyes:     Pupils: Pupils are equal, round, and reactive to light.  Neck:     Thyroid: No thyromegaly.     Trachea: No tracheal deviation.  Cardiovascular:     Rate and Rhythm: Normal rate.  Pulmonary:     Effort: Pulmonary  effort is normal.  Abdominal:     Palpations: Abdomen is soft.  Musculoskeletal:     Cervical back: No rigidity.  Skin:    General: Skin is warm and dry.  Neurological:     Mental Status: She is alert and oriented to person, place, and time.  Psychiatric:        Behavior: Behavior normal.     Ortho Exam patient is in a wheelchair.  No range of motion of her ankle present.  Some tenderness left ankle over the deltoid also lateral ligaments.  Trace ankle effusion.  Specialty Comments:  No specialty comments available.  Imaging: XR Lumbar Spine 2-3 Views Result Date: 04/27/2023 [facility] lateral lumbar images are obtained and reviewed this shows multilevel disc space narrowing from T11 all the way to the sacrum.  Anterior and posterior spurs are noted at all levels which would narrow the canal. Impression:  Multilevel lumbar disc degeneration straightening of the lumbar spine.  Negative for Acute compression fracture  XR Ankle Complete Right Result Date: 04/27/2023 Three-view x-rays right ankle obtained and reviewed this shows solid ankle fusion with intramedullary rod with interlock.  Midfoot is normal. Impression: Solid right ankle fusion no evidence of no acute trauma.  XR Ankle Complete Left Result Date: 04/27/2023 Three-view x-rays left ankle obtained and reviewed.  There is calcification areas in the deltoid ligament consistent with old ankle injury.  No subluxation of the ankle.  Some anterior spurring noted without subluxation on the lateral radiograph. Impression: Changes consistent with old ligamentous injury.  No acute injury noted.    PMFS History: Patient Active Problem List   Diagnosis Date Noted   Physical deconditioning 03/30/2023   Fall 11/25/2022   COVID-19 virus infection 09/17/2022   Immunosuppressed status (HCC) 09/17/2022   Abnormal CT of the abdomen 08/09/2022   Paraumbilical hernia 07/29/2022   Esophageal varices in cirrhosis (HCC) 07/29/2022   Cirrhosis of liver without ascites (HCC) 07/12/2022   LUQ abdominal pain 07/05/2022   Drug-induced constipation 07/05/2022   Anxiety disorder 08/28/2021   Disorder of intervertebral disc of cervical spine 08/28/2021   Family history of malignant neoplasm of digestive organs 08/28/2021   Malignant neoplasm of right breast (HCC) 07/08/2021   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   Pseudophakia of both eyes 04/01/2021   Retinal edema 04/01/2021   Trichiasis of eyelid of both eyes 04/01/2021   Hypertrophy of inferior nasal turbinate 02/26/2021   Allergic rhinitis due to pollen 01/29/2021   Allergy to influenza vaccine 01/29/2021   Gout 01/29/2021   Hyperlipidemia 01/29/2021   Nonalcoholic steatohepatitis (NASH) 01/29/2021   Primary insomnia 01/29/2021   Recurrent major depression in remission (HCC) 01/29/2021    Arthritis of right ankle 04/14/2020   Nasal septal deviation 03/11/2020   Arthritis of right subtalar joint 10/09/2019   Acquired pes planovalgus of right foot 10/09/2019   Acquired posterior equinus of right lower extremity 10/09/2019   Epistaxis, recurrent 09/26/2019   OSA on CPAP 09/26/2019   Acquired hypothyroidism 05/21/2019   Arthritis of shoulder 09/07/2018   Bilateral hearing loss 08/01/2018   Vertigo 08/01/2018   Chronic nonintractable headache 08/01/2018   Type 2 diabetes mellitus with diabetic polyneuropathy, with long-term current use of insulin (HCC) 11/16/2016   Bilateral lower extremity edema 09/01/2016   Diabetic polyneuropathy associated with type 2 diabetes mellitus (HCC) 06/03/2016   Malignant neoplasm of upper-outer quadrant of left breast in female, estrogen receptor positive (HCC) 08/19/2015   Osteoarthritis of right knee 02/28/2015   Status  post total right knee replacement 02/28/2015   Splenomegaly 07/30/2014   Thrombocytopenia (HCC) 07/30/2014   Recurrent falls 01/12/2013   Morbid obesity (HCC) 01/12/2013   Degenerative arthritis of spine 01/12/2013   Essential hypertension 09/05/2012   Past Medical History:  Diagnosis Date   Abdominal wall contusion 12/09/2011   Achilles tendon contracture, bilateral 06/03/2016   Anxiety    Arthritis    Blood dyscrasia    low platelet count- sees [name] , Dr. Nelva Nay ( Note in EPIC from 07/2014) at [facility]   Breast cancer [facility]) 2017   Left Breast   Breast cancer (HCC) 07/2021   right breast IDC   Cancer (HCC)    left breast   Chest wall contusion 12/09/2011   Cirrhosis of liver (HCC)    Cough    Diabetes mellitus without complication (HCC)    Diabetic neuropathy (HCC)    Diarrhea due to drug 01/29/2021   Dyspnea    Gout    Gout    History of radiation therapy 10/22/15 - 12/08/15   left breast 50.4 Gy, boost to 10 Gy   History of radiation therapy    Right breast- 09/22/21-10/30/21- Dr.  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   Neuromuscular disorder (HCC)    diabetic neuropathy   Neuropathy    Personal history of radiation therapy 2017   Left Breast Cancer   Posterior tibial tendon dysfunction (PTTD) of right lower extremity 10/09/2019   Sleep apnea    CPAP nightly   Spleen enlarged    Thyroid disease     Family History  Problem Relation Age of Onset   Hypothyroidism Mother    Alzheimer's disease Mother    Diabetes Father    Cancer Sister        GYN cancer   Hypothyroidism Sister    Heart disease Brother    Arthritis Son    Diabetes Son    Arthritis Son    Cancer Maternal Grandmother    Heart failure Maternal Grandfather    Bone cancer Paternal Grandmother    Heart failure Paternal Grandmother     Past Surgical History:  Procedure Laterality Date   ANKLE FUSION Right 2022   BIOPSY  10/19/2022   Procedure: BIOPSY;  Surgeon: Dolores Frame, MD;  [facility];  Service: Gastroenterology;;   BREAST BIOPSY Left 08/04/2015   Procedure: LEFT BREAST BIOPSY WITH NEEDLE LOCALIZATION;  Surgeon: Darnell Level, MD;  Location: [facility];  Service: General;  Laterality: Left;   BREAST BIOPSY Right 06/23/2021   BREAST DUCTAL SYSTEM EXCISION Left 08/04/2015   Procedure: LEFT EXCISION DUCTAL SYSTEM BREAST;  Surgeon: Darnell Level, MD;  Location: [facility];  Service: General;  Laterality: Left;   BREAST LUMPECTOMY Left 08/2015   BREAST LUMPECTOMY Right 08/2021   BREAST LUMPECTOMY WITH AXILLARY LYMPH NODE BIOPSY Left 09/12/2015   Procedure: RE-EXCISION OF LEFT BREAST LUMPECTOMY WITH LEFT AXILLARY LYMPH NODE BIOPSY;  Surgeon: Darnell Level, MD;  Location: [facility];  Service: General;  Laterality: Left;   BREAST LUMPECTOMY WITH RADIOACTIVE SEED LOCALIZATION Right 08/12/2021   Procedure: RIGHT BREAST LUMPECTOMY WITH RADIOACTIVE SEED LOCALIZATION;   Surgeon: Abigail Miyamoto, MD;  Location: [facility];  Service: General;  Laterality: Right;  LMA   CHOLECYSTECTOMY     ESOPHAGOGASTRODUODENOSCOPY (EGD) WITH PROPOFOL N/A 10/19/2022   Procedure: ESOPHAGOGASTRODUODENOSCOPY (EGD) WITH PROPOFOL;  Surgeon: Marguerita Merles, Reuel Boom, MD;  Location[facility];  Service: Gastroenterology;  Laterality: N/A;  10:30am asa 3   EYE SURGERY Bilateral 2022   Lashes growing backwards into the eye   JOINT REPLACEMENT     left knee   JOINT REPLACEMENT  2017   right knee   REVERSE SHOULDER ARTHROPLASTY Right 09/07/2018   Procedure: RIGHT REVERSE SHOULDER ARTHROPLASTY;  Surgeon: Cammy Copa, MD;  Location: [facility];  Service: Orthopedics;  Laterality: Right;   TOTAL KNEE ARTHROPLASTY Right 02/28/2015   Procedure: RIGHT TOTAL KNEE ARTHROPLASTY;  Surgeon: Kathryne Hitch, MD;  Location: [facility];  Service: Orthopedics;  Laterality: Right;   TUBAL LIGATION     Social History   Occupational History   Occupation: Retired    Comment: Dance movement psychotherapist  Tobacco Use   Smoking status: Never    Passive exposure: Never   Smokeless tobacco: Never  Vaping Use   Vaping status: Never Used  Substance and Sexual Activity   Alcohol use: No   Drug use: No   Sexual activity: Yes    Partners: Male    Birth control/protection: Surgical    Comment: BTL

## 2023-04-28 DIAGNOSIS — Z6841 Body Mass Index (BMI) 40.0 and over, adult: Secondary | ICD-10-CM | POA: Diagnosis not present

## 2023-04-28 DIAGNOSIS — E1142 Type 2 diabetes mellitus with diabetic polyneuropathy: Secondary | ICD-10-CM | POA: Diagnosis not present

## 2023-04-28 DIAGNOSIS — I10 Essential (primary) hypertension: Secondary | ICD-10-CM | POA: Diagnosis not present

## 2023-04-28 DIAGNOSIS — C50412 Malignant neoplasm of upper-outer quadrant of left female breast: Secondary | ICD-10-CM | POA: Diagnosis not present

## 2023-04-28 DIAGNOSIS — Z794 Long term (current) use of insulin: Secondary | ICD-10-CM | POA: Diagnosis not present

## 2023-04-30 DIAGNOSIS — Z9181 History of falling: Secondary | ICD-10-CM | POA: Diagnosis not present

## 2023-04-30 DIAGNOSIS — I10 Essential (primary) hypertension: Secondary | ICD-10-CM | POA: Diagnosis not present

## 2023-04-30 DIAGNOSIS — E039 Hypothyroidism, unspecified: Secondary | ICD-10-CM | POA: Diagnosis not present

## 2023-04-30 DIAGNOSIS — L121 Cicatricial pemphigoid: Secondary | ICD-10-CM | POA: Diagnosis not present

## 2023-04-30 DIAGNOSIS — F419 Anxiety disorder, unspecified: Secondary | ICD-10-CM | POA: Diagnosis not present

## 2023-04-30 DIAGNOSIS — M150 Primary generalized (osteo)arthritis: Secondary | ICD-10-CM | POA: Diagnosis not present

## 2023-04-30 DIAGNOSIS — C50412 Malignant neoplasm of upper-outer quadrant of left female breast: Secondary | ICD-10-CM | POA: Diagnosis not present

## 2023-04-30 DIAGNOSIS — E1142 Type 2 diabetes mellitus with diabetic polyneuropathy: Secondary | ICD-10-CM | POA: Diagnosis not present

## 2023-04-30 DIAGNOSIS — Z7985 Long-term (current) use of injectable non-insulin antidiabetic drugs: Secondary | ICD-10-CM | POA: Diagnosis not present

## 2023-04-30 DIAGNOSIS — Z556 Problems related to health literacy: Secondary | ICD-10-CM | POA: Diagnosis not present

## 2023-04-30 DIAGNOSIS — M109 Gout, unspecified: Secondary | ICD-10-CM | POA: Diagnosis not present

## 2023-04-30 DIAGNOSIS — Z794 Long term (current) use of insulin: Secondary | ICD-10-CM | POA: Diagnosis not present

## 2023-04-30 DIAGNOSIS — Z7984 Long term (current) use of oral hypoglycemic drugs: Secondary | ICD-10-CM | POA: Diagnosis not present

## 2023-04-30 DIAGNOSIS — Z6841 Body Mass Index (BMI) 40.0 and over, adult: Secondary | ICD-10-CM | POA: Diagnosis not present

## 2023-04-30 DIAGNOSIS — F329 Major depressive disorder, single episode, unspecified: Secondary | ICD-10-CM | POA: Diagnosis not present

## 2023-04-30 DIAGNOSIS — G4733 Obstructive sleep apnea (adult) (pediatric): Secondary | ICD-10-CM | POA: Diagnosis not present

## 2023-04-30 DIAGNOSIS — E785 Hyperlipidemia, unspecified: Secondary | ICD-10-CM | POA: Diagnosis not present

## 2023-05-01 ENCOUNTER — Other Ambulatory Visit: Payer: Self-pay

## 2023-05-01 ENCOUNTER — Emergency Department (HOSPITAL_COMMUNITY)

## 2023-05-01 ENCOUNTER — Inpatient Hospital Stay (HOSPITAL_COMMUNITY)
Admission: EM | Admit: 2023-05-01 | Discharge: 2023-05-06 | DRG: 481 | Disposition: A | Discharge status: Skilled Nursing Facility | Attending: Internal Medicine | Admitting: Internal Medicine

## 2023-05-01 ENCOUNTER — Encounter (HOSPITAL_COMMUNITY): Payer: Self-pay

## 2023-05-01 DIAGNOSIS — R296 Repeated falls: Secondary | ICD-10-CM | POA: Diagnosis not present

## 2023-05-01 DIAGNOSIS — S72001A Fracture of unspecified part of neck of right femur, initial encounter for closed fracture: Secondary | ICD-10-CM | POA: Diagnosis not present

## 2023-05-01 DIAGNOSIS — Z79890 Hormone replacement therapy (postmenopausal): Secondary | ICD-10-CM

## 2023-05-01 DIAGNOSIS — N179 Acute kidney failure, unspecified: Secondary | ICD-10-CM | POA: Diagnosis present

## 2023-05-01 DIAGNOSIS — S72451A Displaced supracondylar fracture without intracondylar extension of lower end of right femur, initial encounter for closed fracture: Secondary | ICD-10-CM | POA: Diagnosis not present

## 2023-05-01 DIAGNOSIS — R42 Dizziness and giddiness: Secondary | ICD-10-CM

## 2023-05-01 DIAGNOSIS — E1169 Type 2 diabetes mellitus with other specified complication: Secondary | ICD-10-CM | POA: Diagnosis not present

## 2023-05-01 DIAGNOSIS — S728X1A Other fracture of right femur, initial encounter for closed fracture: Secondary | ICD-10-CM | POA: Diagnosis not present

## 2023-05-01 DIAGNOSIS — Z993 Dependence on wheelchair: Secondary | ICD-10-CM | POA: Diagnosis not present

## 2023-05-01 DIAGNOSIS — K7460 Unspecified cirrhosis of liver: Secondary | ICD-10-CM | POA: Diagnosis not present

## 2023-05-01 DIAGNOSIS — S72491D Other fracture of lower end of right femur, subsequent encounter for closed fracture with routine healing: Secondary | ICD-10-CM | POA: Diagnosis not present

## 2023-05-01 DIAGNOSIS — K7581 Nonalcoholic steatohepatitis (NASH): Secondary | ICD-10-CM | POA: Diagnosis present

## 2023-05-01 DIAGNOSIS — I10 Essential (primary) hypertension: Secondary | ICD-10-CM | POA: Diagnosis present

## 2023-05-01 DIAGNOSIS — D61818 Other pancytopenia: Secondary | ICD-10-CM | POA: Diagnosis present

## 2023-05-01 DIAGNOSIS — S7291XD Unspecified fracture of right femur, subsequent encounter for closed fracture with routine healing: Secondary | ICD-10-CM | POA: Diagnosis not present

## 2023-05-01 DIAGNOSIS — S7292XA Unspecified fracture of left femur, initial encounter for closed fracture: Secondary | ICD-10-CM | POA: Diagnosis not present

## 2023-05-01 DIAGNOSIS — D696 Thrombocytopenia, unspecified: Secondary | ICD-10-CM | POA: Diagnosis not present

## 2023-05-01 DIAGNOSIS — E66813 Obesity, class 3: Secondary | ICD-10-CM | POA: Diagnosis present

## 2023-05-01 DIAGNOSIS — Z853 Personal history of malignant neoplasm of breast: Secondary | ICD-10-CM

## 2023-05-01 DIAGNOSIS — Y92008 Other place in unspecified non-institutional (private) residence as the place of occurrence of the external cause: Secondary | ICD-10-CM

## 2023-05-01 DIAGNOSIS — G4733 Obstructive sleep apnea (adult) (pediatric): Secondary | ICD-10-CM | POA: Diagnosis not present

## 2023-05-01 DIAGNOSIS — Z79899 Other long term (current) drug therapy: Secondary | ICD-10-CM

## 2023-05-01 DIAGNOSIS — Z7985 Long-term (current) use of injectable non-insulin antidiabetic drugs: Secondary | ICD-10-CM

## 2023-05-01 DIAGNOSIS — K219 Gastro-esophageal reflux disease without esophagitis: Secondary | ICD-10-CM | POA: Diagnosis present

## 2023-05-01 DIAGNOSIS — Z170 Estrogen receptor positive status [ER+]: Secondary | ICD-10-CM

## 2023-05-01 DIAGNOSIS — M9711XD Periprosthetic fracture around internal prosthetic right knee joint, subsequent encounter: Secondary | ICD-10-CM | POA: Diagnosis not present

## 2023-05-01 DIAGNOSIS — Z923 Personal history of irradiation: Secondary | ICD-10-CM | POA: Diagnosis not present

## 2023-05-01 DIAGNOSIS — Z96651 Presence of right artificial knee joint: Secondary | ICD-10-CM | POA: Diagnosis not present

## 2023-05-01 DIAGNOSIS — Z79624 Long term (current) use of inhibitors of nucleotide synthesis: Secondary | ICD-10-CM

## 2023-05-01 DIAGNOSIS — C50412 Malignant neoplasm of upper-outer quadrant of left female breast: Secondary | ICD-10-CM | POA: Diagnosis not present

## 2023-05-01 DIAGNOSIS — Z981 Arthrodesis status: Secondary | ICD-10-CM

## 2023-05-01 DIAGNOSIS — E1165 Type 2 diabetes mellitus with hyperglycemia: Secondary | ICD-10-CM | POA: Diagnosis present

## 2023-05-01 DIAGNOSIS — S79929A Unspecified injury of unspecified thigh, initial encounter: Secondary | ICD-10-CM | POA: Diagnosis not present

## 2023-05-01 DIAGNOSIS — S72491A Other fracture of lower end of right femur, initial encounter for closed fracture: Principal | ICD-10-CM | POA: Diagnosis present

## 2023-05-01 DIAGNOSIS — Z887 Allergy status to serum and vaccine status: Secondary | ICD-10-CM

## 2023-05-01 DIAGNOSIS — Z7401 Bed confinement status: Secondary | ICD-10-CM | POA: Diagnosis not present

## 2023-05-01 DIAGNOSIS — E1140 Type 2 diabetes mellitus with diabetic neuropathy, unspecified: Secondary | ICD-10-CM | POA: Diagnosis present

## 2023-05-01 DIAGNOSIS — E1142 Type 2 diabetes mellitus with diabetic polyneuropathy: Secondary | ICD-10-CM | POA: Diagnosis not present

## 2023-05-01 DIAGNOSIS — Z96611 Presence of right artificial shoulder joint: Secondary | ICD-10-CM | POA: Diagnosis present

## 2023-05-01 DIAGNOSIS — Z885 Allergy status to narcotic agent status: Secondary | ICD-10-CM

## 2023-05-01 DIAGNOSIS — W19XXXA Unspecified fall, initial encounter: Secondary | ICD-10-CM | POA: Diagnosis not present

## 2023-05-01 DIAGNOSIS — D630 Anemia in neoplastic disease: Secondary | ICD-10-CM | POA: Diagnosis present

## 2023-05-01 DIAGNOSIS — Z833 Family history of diabetes mellitus: Secondary | ICD-10-CM | POA: Diagnosis not present

## 2023-05-01 DIAGNOSIS — M9711XA Periprosthetic fracture around internal prosthetic right knee joint, initial encounter: Secondary | ICD-10-CM | POA: Diagnosis present

## 2023-05-01 DIAGNOSIS — F3340 Major depressive disorder, recurrent, in remission, unspecified: Secondary | ICD-10-CM | POA: Diagnosis not present

## 2023-05-01 DIAGNOSIS — Z8249 Family history of ischemic heart disease and other diseases of the circulatory system: Secondary | ICD-10-CM | POA: Diagnosis not present

## 2023-05-01 DIAGNOSIS — Z888 Allergy status to other drugs, medicaments and biological substances status: Secondary | ICD-10-CM

## 2023-05-01 DIAGNOSIS — Z6841 Body Mass Index (BMI) 40.0 and over, adult: Secondary | ICD-10-CM

## 2023-05-01 DIAGNOSIS — E785 Hyperlipidemia, unspecified: Secondary | ICD-10-CM | POA: Diagnosis not present

## 2023-05-01 DIAGNOSIS — F419 Anxiety disorder, unspecified: Secondary | ICD-10-CM | POA: Diagnosis not present

## 2023-05-01 DIAGNOSIS — S82831D Other fracture of upper and lower end of right fibula, subsequent encounter for closed fracture with routine healing: Secondary | ICD-10-CM | POA: Diagnosis not present

## 2023-05-01 DIAGNOSIS — M6281 Muscle weakness (generalized): Secondary | ICD-10-CM | POA: Diagnosis not present

## 2023-05-01 DIAGNOSIS — Z79811 Long term (current) use of aromatase inhibitors: Secondary | ICD-10-CM

## 2023-05-01 DIAGNOSIS — Z794 Long term (current) use of insulin: Secondary | ICD-10-CM

## 2023-05-01 DIAGNOSIS — S7292XD Unspecified fracture of left femur, subsequent encounter for closed fracture with routine healing: Secondary | ICD-10-CM | POA: Diagnosis not present

## 2023-05-01 DIAGNOSIS — R531 Weakness: Secondary | ICD-10-CM | POA: Diagnosis not present

## 2023-05-01 DIAGNOSIS — F418 Other specified anxiety disorders: Secondary | ICD-10-CM | POA: Diagnosis not present

## 2023-05-01 DIAGNOSIS — E119 Type 2 diabetes mellitus without complications: Secondary | ICD-10-CM | POA: Diagnosis not present

## 2023-05-01 DIAGNOSIS — W108XXA Fall (on) (from) other stairs and steps, initial encounter: Secondary | ICD-10-CM | POA: Diagnosis present

## 2023-05-01 DIAGNOSIS — N3281 Overactive bladder: Secondary | ICD-10-CM | POA: Diagnosis not present

## 2023-05-01 DIAGNOSIS — S72351A Displaced comminuted fracture of shaft of right femur, initial encounter for closed fracture: Secondary | ICD-10-CM | POA: Diagnosis not present

## 2023-05-01 DIAGNOSIS — J449 Chronic obstructive pulmonary disease, unspecified: Secondary | ICD-10-CM | POA: Diagnosis not present

## 2023-05-01 DIAGNOSIS — S7291XA Unspecified fracture of right femur, initial encounter for closed fracture: Secondary | ICD-10-CM

## 2023-05-01 DIAGNOSIS — E039 Hypothyroidism, unspecified: Secondary | ICD-10-CM | POA: Diagnosis not present

## 2023-05-01 DIAGNOSIS — S72401A Unspecified fracture of lower end of right femur, initial encounter for closed fracture: Secondary | ICD-10-CM | POA: Diagnosis not present

## 2023-05-01 DIAGNOSIS — H9193 Unspecified hearing loss, bilateral: Secondary | ICD-10-CM | POA: Diagnosis not present

## 2023-05-01 DIAGNOSIS — S82401A Unspecified fracture of shaft of right fibula, initial encounter for closed fracture: Secondary | ICD-10-CM | POA: Diagnosis not present

## 2023-05-01 DIAGNOSIS — Z820 Family history of epilepsy and other diseases of the nervous system: Secondary | ICD-10-CM

## 2023-05-01 LAB — CREATININE, SERUM
Creatinine, Ser: 0.8 mg/dL (ref 0.44–1.00)
GFR, Estimated: >60 mL/min (ref >60)

## 2023-05-01 LAB — CBC WITH DIFFERENTIAL/PLATELET
Abs Immature Granulocytes: 0.01 10*3/uL (ref 0.00–0.07)
Basophils Absolute: 0 10*3/uL (ref 0.0–0.1)
Basophils Relative: 1 %
Eosinophils Absolute: 0.1 10*3/uL (ref 0.0–0.5)
Eosinophils Relative: 4 %
HCT: 36.8 % (ref 36.0–46.0)
Hemoglobin: 11.8 g/dL — ABNORMAL LOW (ref 12.0–15.0)
Immature Granulocytes: 0 %
Lymphocytes Relative: 18 %
Lymphs Abs: 0.6 10*3/uL — ABNORMAL LOW (ref 0.7–4.0)
MCH: 28.2 pg (ref 26.0–34.0)
MCHC: 32.1 g/dL (ref 30.0–36.0)
MCV: 87.8 fL (ref 80.0–100.0)
Monocytes Absolute: 0.3 10*3/uL (ref 0.1–1.0)
Monocytes Relative: 8 %
Neutro Abs: 2.2 10*3/uL (ref 1.7–7.7)
Neutrophils Relative %: 69 %
Platelets: 68 10*3/uL — ABNORMAL LOW (ref 150–400)
RBC: 4.19 MIL/uL (ref 3.87–5.11)
RDW: 15 % (ref 11.5–15.5)
Smear Review: DECREASED
WBC: 3.2 10*3/uL — ABNORMAL LOW (ref 4.0–10.5)
nRBC: 0 % (ref 0.0–0.2)

## 2023-05-01 LAB — HEMOGLOBIN A1C
Hgb A1c MFr Bld: 6.5 % — ABNORMAL HIGH (ref 4.8–5.6)
Mean Plasma Glucose: 139.85 mg/dL

## 2023-05-01 LAB — CBC
HCT: 33.8 % — ABNORMAL LOW (ref 36.0–46.0)
Hemoglobin: 11.1 g/dL — ABNORMAL LOW (ref 12.0–15.0)
MCH: 28.5 pg (ref 26.0–34.0)
MCHC: 32.8 g/dL (ref 30.0–36.0)
MCV: 86.7 fL (ref 80.0–100.0)
Platelets: 73 10*3/uL — ABNORMAL LOW (ref 150–400)
RBC: 3.9 MIL/uL (ref 3.87–5.11)
RDW: 15.1 % (ref 11.5–15.5)
WBC: 4.6 10*3/uL (ref 4.0–10.5)
nRBC: 0 % (ref 0.0–0.2)

## 2023-05-01 LAB — BASIC METABOLIC PANEL WITH GFR
Anion gap: 9 (ref 5–15)
BUN: 17 mg/dL (ref 8–23)
CO2: 21 mmol/L — ABNORMAL LOW (ref 22–32)
Calcium: 9.3 mg/dL (ref 8.9–10.3)
Chloride: 103 mmol/L (ref 98–111)
Creatinine, Ser: 0.76 mg/dL (ref 0.44–1.00)
GFR, Estimated: >60 mL/min (ref >60)
Glucose, Bld: 178 mg/dL — ABNORMAL HIGH (ref 70–99)
Potassium: 3.9 mmol/L (ref 3.5–5.1)
Sodium: 133 mmol/L — ABNORMAL LOW (ref 135–145)

## 2023-05-01 LAB — GLUCOSE, CAPILLARY
Glucose-Capillary: 144 mg/dL — ABNORMAL HIGH (ref 70–99)
Glucose-Capillary: 155 mg/dL — ABNORMAL HIGH (ref 70–99)

## 2023-05-01 MED ORDER — CYCLOBENZAPRINE HCL 5 MG PO TABS
5.0000 mg | ORAL_TABLET | Freq: Three times a day (TID) | ORAL | Status: DC | PRN
  Administered 2023-05-01 – 2023-05-06 (×7): 5 mg via ORAL
  Filled 2023-05-01 (×7): qty 1

## 2023-05-01 MED ORDER — MORPHINE SULFATE (PF) 2 MG/ML IV SOLN
2.0000 mg | Freq: Once | INTRAVENOUS | Status: AC | PRN
  Administered 2023-05-01: 2 mg via INTRAVENOUS
  Filled 2023-05-01: qty 1

## 2023-05-01 MED ORDER — ONDANSETRON HCL 4 MG/2ML IJ SOLN
4.0000 mg | Freq: Four times a day (QID) | INTRAMUSCULAR | Status: DC | PRN
  Administered 2023-05-01 – 2023-05-02 (×3): 4 mg via INTRAVENOUS
  Filled 2023-05-01 (×3): qty 2

## 2023-05-01 MED ORDER — EXEMESTANE 25 MG PO TABS
25.0000 mg | ORAL_TABLET | Freq: Every day | ORAL | Status: DC
  Administered 2023-05-02 – 2023-05-06 (×5): 25 mg via ORAL
  Filled 2023-05-01 (×5): qty 1

## 2023-05-01 MED ORDER — ENOXAPARIN SODIUM 40 MG/0.4ML IJ SOSY: 40.0000 mg | PREFILLED_SYRINGE | INTRAMUSCULAR | Status: DC

## 2023-05-01 MED ORDER — OXYCODONE HCL 5 MG PO TABS
5.0000 mg | ORAL_TABLET | ORAL | Status: DC | PRN
  Administered 2023-05-01 – 2023-05-05 (×9): 5 mg via ORAL
  Filled 2023-05-01 (×9): qty 1

## 2023-05-01 MED ORDER — TRAMADOL HCL 50 MG PO TABS: 50.0000 mg | ORAL_TABLET | Freq: Four times a day (QID) | ORAL | Status: DC | PRN

## 2023-05-01 MED ORDER — BUPROPION HCL ER (XL) 150 MG PO TB24
150.0000 mg | ORAL_TABLET | Freq: Every day | ORAL | Status: DC
  Administered 2023-05-01 – 2023-05-06 (×6): 150 mg via ORAL
  Filled 2023-05-01 (×6): qty 1

## 2023-05-01 MED ORDER — ONDANSETRON HCL 4 MG PO TABS
4.0000 mg | ORAL_TABLET | Freq: Four times a day (QID) | ORAL | Status: DC | PRN
  Filled 2023-05-01: qty 1

## 2023-05-01 MED ORDER — LEVOTHYROXINE SODIUM 75 MCG PO TABS
150.0000 ug | ORAL_TABLET | Freq: Every day | ORAL | Status: DC
  Administered 2023-05-01 – 2023-05-06 (×5): 150 ug via ORAL
  Filled 2023-05-01 (×5): qty 2

## 2023-05-01 MED ORDER — INSULIN ASPART 100 UNIT/ML IJ SOLN
0.0000 [IU] | Freq: Three times a day (TID) | INTRAMUSCULAR | Status: DC
  Administered 2023-05-02: 3 [IU] via SUBCUTANEOUS
  Administered 2023-05-02: 2 [IU] via SUBCUTANEOUS
  Administered 2023-05-03 (×3): 3 [IU] via SUBCUTANEOUS
  Administered 2023-05-04: 5 [IU] via SUBCUTANEOUS
  Administered 2023-05-04: 3 [IU] via SUBCUTANEOUS
  Administered 2023-05-04: 2 [IU] via SUBCUTANEOUS
  Administered 2023-05-05 (×2): 5 [IU] via SUBCUTANEOUS
  Administered 2023-05-05: 2 [IU] via SUBCUTANEOUS
  Administered 2023-05-06: 3 [IU] via SUBCUTANEOUS

## 2023-05-01 MED ORDER — CARVEDILOL 3.125 MG PO TABS
3.1250 mg | ORAL_TABLET | Freq: Two times a day (BID) | ORAL | Status: DC
  Administered 2023-05-01: 3.125 mg via ORAL
  Filled 2023-05-01: qty 1

## 2023-05-01 MED ORDER — MECLIZINE HCL 25 MG PO TABS: 25.0000 mg | ORAL_TABLET | Freq: Three times a day (TID) | ORAL | Status: DC | PRN

## 2023-05-01 MED ORDER — MIRABEGRON ER 25 MG PO TB24
25.0000 mg | ORAL_TABLET | Freq: Every day | ORAL | Status: DC
  Administered 2023-05-01 – 2023-05-06 (×6): 25 mg via ORAL
  Filled 2023-05-01 (×6): qty 1

## 2023-05-01 MED ORDER — ACETAMINOPHEN 325 MG PO TABS
650.0000 mg | ORAL_TABLET | Freq: Four times a day (QID) | ORAL | Status: DC | PRN
  Administered 2023-05-02 – 2023-05-04 (×2): 650 mg via ORAL
  Filled 2023-05-01 (×2): qty 2

## 2023-05-01 MED ORDER — PANTOPRAZOLE SODIUM 40 MG PO TBEC
40.0000 mg | DELAYED_RELEASE_TABLET | Freq: Every day | ORAL | Status: DC
  Administered 2023-05-01 – 2023-05-06 (×6): 40 mg via ORAL
  Filled 2023-05-01 (×6): qty 1

## 2023-05-01 MED ORDER — MYCOPHENOLATE MOFETIL 500 MG PO TABS
1500.0000 mg | ORAL_TABLET | Freq: Two times a day (BID) | ORAL | Status: DC
Start: 2023-05-01 — End: 2023-05-01

## 2023-05-01 MED ORDER — MORPHINE SULFATE (PF) 2 MG/ML IV SOLN
2.0000 mg | Freq: Once | INTRAVENOUS | Status: AC
  Administered 2023-05-01: 2 mg via INTRAVENOUS
  Filled 2023-05-01: qty 1

## 2023-05-01 MED ORDER — ONDANSETRON HCL 4 MG/2ML IJ SOLN
4.0000 mg | Freq: Once | INTRAMUSCULAR | Status: AC
  Administered 2023-05-01: 4 mg via INTRAVENOUS
  Filled 2023-05-01: qty 2

## 2023-05-01 MED ORDER — LOSARTAN POTASSIUM 50 MG PO TABS
50.0000 mg | ORAL_TABLET | Freq: Every day | ORAL | Status: DC
  Administered 2023-05-01: 50 mg via ORAL
  Filled 2023-05-01: qty 1

## 2023-05-01 MED ORDER — HYDROCODONE-ACETAMINOPHEN 5-325 MG PO TABS
1.0000 | ORAL_TABLET | Freq: Once | ORAL | Status: AC
  Administered 2023-05-01: 1 via ORAL
  Filled 2023-05-01: qty 1

## 2023-05-01 MED ORDER — HYDROMORPHONE HCL 1 MG/ML IJ SOLN
1.0000 mg | INTRAMUSCULAR | Status: DC | PRN
  Administered 2023-05-01 – 2023-05-06 (×15): 1 mg via INTRAVENOUS
  Filled 2023-05-01 (×16): qty 1

## 2023-05-01 MED ORDER — ACETAMINOPHEN 650 MG RE SUPP: 650.0000 mg | Freq: Four times a day (QID) | RECTAL | Status: DC | PRN

## 2023-05-01 NOTE — Assessment & Plan Note (Signed)
 Likely NASH, no signs of acute decompensation  Follow up as outpatient.

## 2023-05-01 NOTE — Progress Notes (Signed)
 [name] [name]'s family including husband, daughter, and other family member are all concerned about Dr. Steward Drone doing surgery under orthopedics. They wanted a trauma Dr to do surgery tonight. Family was educated by this Water engineer that pt is under orthopedics care. Dr. Ella Jubilee was notified that pt family wanted trauma consult placed. He called me and stated that orthopedics have assessed and they would be the adequate team, all family was educated on what was related to staff member by Dr. Ella Jubilee and Dr. Steward Drone as well as Dr. Bland Span.  Patient and family were educated that they could request a different doctor if they wanted a different doctor but that could potentially delay surgery time.  They were upset that the surgery was so late tomorrow and Janika care would be delayed. Family was under the impression by ED that surgery would be at 0630. Educated that surgery was at 1630. Pt crying and wants family to not be upset, she states "I don't want anyone to get in trouble". Vung states she will think about switching drs or not and will notify staff when she decides.

## 2023-05-01 NOTE — Assessment & Plan Note (Addendum)
 Follow up as outpatient  Continue with aromasin

## 2023-05-01 NOTE — Progress Notes (Signed)
 Orthopedic Tech Progress Note Patient Details:  [name] [name] 1950-10-15 [id number]  Knee immobilizer placed to RLE in best obtainable position d/t pt body habitus and discomfort level.  Ortho Devices Type of Ortho Device: Knee Immobilizer Ortho Device/Splint Location: RLE Ortho Device/Splint Interventions: Ordered, Application, Adjustment   Post Interventions Patient Tolerated: Poor, Fair Instructions Provided: Care of device  Shahzad Thomann Carmine Savoy 05/01/2023, 2:59 PM

## 2023-05-01 NOTE — Assessment & Plan Note (Signed)
 Continue with levothyroxine

## 2023-05-01 NOTE — ED Provider Notes (Signed)
  EMERGENCY DEPARTMENT AT [facility] Provider Note  CSN: [id number] Arrival date & time: 05/01/23 1253  Chief Complaint(s) Knee Pain  HPI [name] [name] is a 73 y.o. female with past medical history as below, significant for diabetic neuropathy, DM, obesity, gout, prior total knee bilateral who presents to the ED with complaint of fall, knee injury  Prior to arrival patient tripped and fell landed on her right knee.  No other injuries, no head injury or LOC.  No nausea vomiting.  No chest pain or dyspnea.  No syncope.  Felt normal prior to the fall.  Severe pain to right knee following the injury.  Unable to ambulate.  She follows with Ortho care  Not anticoagulated  Past Medical History Past Medical History:  Diagnosis Date   Abdominal wall contusion 12/09/2011   Achilles tendon contracture, bilateral 06/03/2016   Anxiety    Arthritis    Blood dyscrasia    low platelet count- sees [name] , Dr. Nelva Nay ( Note in EPIC from 07/2014) at [facility]   Breast cancer [facility]) 2017   Left Breast   Breast cancer (HCC) 07/2021   right breast IDC   Cancer (HCC)    left breast   Chest wall contusion 12/09/2011   Cirrhosis of liver (HCC)    Cough    Diabetes mellitus without complication (HCC)    Diabetic neuropathy (HCC)    Diarrhea due to drug 01/29/2021   Dyspnea    Gout    Gout    History of radiation therapy 10/22/15 - 12/08/15   left breast 50.4 Gy, boost to 10 Gy   History of radiation therapy    Right breast- 09/22/21-10/30/21- Dr. 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   Neuromuscular disorder (HCC)    diabetic neuropathy   Neuropathy    Personal history of radiation therapy 2017   Left Breast Cancer   Posterior tibial tendon dysfunction (PTTD) of right lower extremity 10/09/2019   Sleep apnea    CPAP nightly   Spleen enlarged    Thyroid disease    Patient Active  Problem List   Diagnosis Date Noted   Closed left femoral fracture (HCC) 05/01/2023   Physical deconditioning 03/30/2023   Fall 11/25/2022   COVID-19 virus infection 09/17/2022   Immunosuppressed status (HCC) 09/17/2022   Abnormal CT of the abdomen 08/09/2022   Paraumbilical hernia 07/29/2022   Esophageal varices in cirrhosis (HCC) 07/29/2022   Cirrhosis of liver without ascites (HCC) 07/12/2022   LUQ abdominal pain 07/05/2022   Drug-induced constipation 07/05/2022   Anxiety disorder 08/28/2021   Disorder of intervertebral disc of cervical spine 08/28/2021   Family history of malignant neoplasm of digestive organs 08/28/2021   Malignant neoplasm of right breast (HCC) 07/08/2021   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   Pseudophakia of both eyes 04/01/2021   Retinal edema 04/01/2021   Trichiasis of eyelid of both eyes 04/01/2021   Hypertrophy of inferior nasal turbinate 02/26/2021   Allergic rhinitis due to pollen 01/29/2021   Allergy to influenza vaccine 01/29/2021   Gout 01/29/2021   Hyperlipidemia 01/29/2021   Nonalcoholic steatohepatitis (NASH) 01/29/2021   Primary insomnia 01/29/2021   Recurrent major depression in remission (HCC) 01/29/2021   Arthritis of right ankle 04/14/2020   Nasal septal deviation 03/11/2020   Arthritis of right subtalar joint 10/09/2019   Acquired pes planovalgus of right foot 10/09/2019   Acquired  posterior equinus of right lower extremity 10/09/2019   Epistaxis, recurrent 09/26/2019   OSA on CPAP 09/26/2019   Acquired hypothyroidism 05/21/2019   Arthritis of shoulder 09/07/2018   Bilateral hearing loss 08/01/2018   Vertigo 08/01/2018   Chronic nonintractable headache 08/01/2018   Type 2 diabetes mellitus with diabetic polyneuropathy, with long-term current use of insulin (HCC) 11/16/2016   Bilateral lower extremity edema 09/01/2016   Diabetic polyneuropathy associated with type 2 diabetes mellitus (HCC) 06/03/2016   Malignant neoplasm of  upper-outer quadrant of left breast in female, estrogen receptor positive (HCC) 08/19/2015   Osteoarthritis of right knee 02/28/2015   Status post total right knee replacement 02/28/2015   Splenomegaly 07/30/2014   Thrombocytopenia (HCC) 07/30/2014   Recurrent falls 01/12/2013   Morbid obesity (HCC) 01/12/2013   Degenerative arthritis of spine 01/12/2013   Essential hypertension 09/05/2012   Home Medication(s) Prior to Admission medications   Medication Sig Start Date End Date Taking? Authorizing Provider  ACCU-CHEK AVIVA PLUS test strip USE AS INSTRUCTED TO CHECK BLOOD SUGAR 2 TIMES DAILY 08/13/21   Shamleffer, Konrad Dolores, MD  acetaminophen (TYLENOL) 500 MG tablet Take by mouth as needed. 10/18/19   [provider]  albuterol (VENTOLIN HFA) 108 (90 Base) MCG/ACT inhaler Inhale 1-2 puffs into the lungs every 4 (four) hours as needed for wheezing or shortness of breath. 11/03/21   Anabel Halon, MD  Ascorbic Acid (VITAMIN C PO) Take by mouth daily.    [provider]  buPROPion (WELLBUTRIN XL) 150 MG 24 hr tablet TAKE 1 TABLET BY MOUTH EVERY DAY IN THE MORNING 05/03/22   Anabel Halon, MD  carvedilol (COREG) 3.125 MG tablet TAKE 1 TABLET BY MOUTH TWICE A DAY WITH FOOD 04/18/23   Carlan, Chelsea L, NP  Cyanocobalamin (VITAMIN B-12 PO) Take by mouth daily.    [provider]  exemestane (AROMASIN) 25 MG tablet TAKE 1 TABLET (25 MG TOTAL) BY MOUTH DAILY AFTER BREAKFAST. 12/27/22   Iruku, Burnice Logan, MD  FARXIGA 10 MG TABS tablet TAKE 1 TABLET BY MOUTH DAILY BEFORE BREAKFAST. 12/07/21   Shamleffer, Konrad Dolores, MD  fluticasone (FLONASE) 50 MCG/ACT nasal spray Place 1 spray into both nostrils as needed.  05/02/18   [provider]  gabapentin (NEURONTIN) 600 MG tablet TAKE 1 TABLET IN THE MORNING AND 1 TABLET IN THE AFTERNOON AND 2TABS AT BEDTIME    [provider]  insulin glargine (LANTUS SOLOSTAR) 100 UNIT/ML Solostar Pen Inject 62 Units into the  skin daily. 06/23/22   Shamleffer, Konrad Dolores, MD  Insulin Pen Needle (B-D UF III MINI PEN NEEDLES) 31G X 5 MM MISC USE AS DIRECTED WITH INSULIN EVERY MORNING, NOON, EVENING AND AT BEDTIME 12/14/21   Shamleffer, Konrad Dolores, MD  Insulin Pen Needle 30G X 5 MM MISC 1 Device by Does not apply route in the morning, at noon, in the evening, and at bedtime. 12/14/21   Shamleffer, Konrad Dolores, MD  ketoconazole (NIZORAL) 2 % cream Apply 1 Application topically daily. 11/03/21   Anabel Halon, MD  levothyroxine (SYNTHROID) 150 MCG tablet Take 1 tablet (150 mcg total) by mouth daily. 12/14/21   Shamleffer, Konrad Dolores, MD  losartan (COZAAR) 50 MG tablet Take 1 tablet (50 mg total) by mouth daily. 03/30/23   Anabel Halon, MD  meclizine (ANTIVERT) 25 MG tablet Take 1 tablet (25 mg total) by mouth 3 (three) times daily as needed for dizziness. 03/30/23   Anabel Halon, MD  methocarbamol Nicholaus Corolla)  500 MG tablet Take 1 tablet (500 mg total) by mouth every 8 (eight) hours as needed for muscle spasms. 09/24/21   Kirtland Bouchard, PA-C  mirabegron ER (MYRBETRIQ) 25 MG TB24 tablet Take 1 tablet (25 mg total) by mouth daily. 08/24/22   Donnita Falls, FNP  Multiple Vitamins-Minerals (MULTIVITAMIN WITH MINERALS) tablet Take 1 tablet by mouth daily.    [provider]  mycophenolate (CELLCEPT) 500 MG tablet Take 1,500 mg by mouth 2 (two) times daily. 04/02/21   [provider]  omeprazole (PRILOSEC) 20 MG capsule Take 1 capsule (20 mg total) by mouth daily. 10/20/22   Dolores Frame, MD  promethazine-dextromethorphan (PROMETHAZINE-DM) 6.25-15 MG/5ML syrup Take 5 mLs by mouth 4 (four) times daily as needed for cough. 02/03/22   Anabel Halon, MD  Pyridoxine HCl (VITAMIN B-6 PO) Take by mouth daily.    [provider]  Semaglutide, 1 MG/DOSE, (OZEMPIC, 1 MG/DOSE,) 4 MG/3ML SOPN Inject 1 mg into the skin once a week. 04/26/23     traMADol (ULTRAM) 50 MG tablet Take 1  tablet (50 mg total) by mouth every 6 (six) hours as needed. 04/18/23   Kirtland Bouchard, PA-C  UNABLE TO FIND 1 each by Does not apply route daily. Med Name: Iantha Fallen  DX [id number] 04/22/22   Anabel Halon, MD  VITAMIN D PO Take by mouth daily.    [provider]                                                                                                                                    Past Surgical History Past Surgical History:  Procedure Laterality Date   ANKLE FUSION Right 2022   BIOPSY  10/19/2022   Procedure: BIOPSY;  Surgeon: Dolores Frame, MD;  Location: [facility];  Service: Gastroenterology;;   BREAST BIOPSY Left 08/04/2015   Procedure: LEFT BREAST BIOPSY WITH NEEDLE LOCALIZATION;  Surgeon: Darnell Level, MD;  Location: [facility];  Service: General;  Laterality: Left;   BREAST BIOPSY Right 06/23/2021   BREAST DUCTAL SYSTEM EXCISION Left 08/04/2015   Procedure: LEFT EXCISION DUCTAL SYSTEM BREAST;  Surgeon: Darnell Level, MD;  Location: [facility];  Service: General;  Laterality: Left;   BREAST LUMPECTOMY Left 08/2015   BREAST LUMPECTOMY Right 08/2021   BREAST LUMPECTOMY WITH AXILLARY LYMPH NODE BIOPSY Left 09/12/2015   Procedure: RE-EXCISION OF LEFT BREAST LUMPECTOMY WITH LEFT AXILLARY LYMPH NODE BIOPSY;  Surgeon: Darnell Level, MD;  Location: [facility];  Service: General;  Laterality: Left;   BREAST LUMPECTOMY WITH RADIOACTIVE SEED LOCALIZATION Right 08/12/2021   Procedure: RIGHT BREAST LUMPECTOMY WITH RADIOACTIVE SEED LOCALIZATION;  Surgeon: Abigail Miyamoto, MD;  Location: [facility];  Service: General;  Laterality: Right;  LMA   CHOLECYSTECTOMY     ESOPHAGOGASTRODUODENOSCOPY (EGD) WITH PROPOFOL N/A 10/19/2022   Procedure: ESOPHAGOGASTRODUODENOSCOPY (EGD) WITH PROPOFOL;  Surgeon: Marguerita Merles, Reuel Boom, MD;  Location: [facility];  Service: Gastroenterology;  Laterality: N/A;   10:30am asa 3   EYE SURGERY Bilateral 2022   Lashes growing backwards into the eye   JOINT REPLACEMENT     left knee   JOINT REPLACEMENT  2017   right knee   REVERSE SHOULDER ARTHROPLASTY Right 09/07/2018   Procedure: RIGHT REVERSE SHOULDER ARTHROPLASTY;  Surgeon: Cammy Copa, MD;  [facility];  Service: Orthopedics;  Laterality: Right;   TOTAL KNEE ARTHROPLASTY Right 02/28/2015   Procedure: RIGHT TOTAL KNEE ARTHROPLASTY;  Surgeon: Kathryne Hitch, MD;  Location: [facility];  Service: Orthopedics;  Laterality: Right;   TUBAL LIGATION     Family History Family History  Problem Relation Age of Onset   Hypothyroidism Mother    Alzheimer's disease Mother    Diabetes Father    Cancer Sister        GYN cancer   Hypothyroidism Sister    Heart disease Brother    Arthritis Son    Diabetes Son    Arthritis Son    Cancer Maternal Grandmother    Heart failure Maternal Grandfather    Bone cancer Paternal Grandmother    Heart failure Paternal Grandmother     Social History Social History   Tobacco Use   Smoking status: Never    Passive exposure: Never   Smokeless tobacco: Never  Vaping Use   Vaping status: Never Used  Substance Use Topics   Alcohol use: No   Drug use: No   Allergies Codeine, Influenza a (h1n1) monoval pf, Hydrocodone-acetaminophen, Lisinopril, Metformin, Pregabalin, and Jardiance [empagliflozin]  Review of Systems A thorough review of systems was obtained and all systems are negative except as noted in the HPI and PMH.   Physical Exam Vital Signs  I have reviewed the triage vital signs BP (!) 188/86   Pulse 81   Temp 97.9 F (36.6 C) (Oral)   Resp 18   SpO2 100%  Physical Exam Vitals and nursing note reviewed.  Constitutional:      Appearance: Normal appearance. She is well-developed. She is obese. She is not ill-appearing or toxic-appearing.  HENT:     Head: Normocephalic and atraumatic.     Right Ear: External ear normal.     Left  Ear: External ear normal.     Nose: Nose normal.     Mouth/Throat:     Mouth: Mucous membranes are moist.  Eyes:     General: No scleral icterus.       Right eye: No discharge.        Left eye: No discharge.  Cardiovascular:     Rate and Rhythm: Normal rate.  Pulmonary:     Effort: Pulmonary effort is normal. No respiratory distress.     Breath sounds: No stridor.  Abdominal:     General: Abdomen is flat. There is no distension.     Tenderness: There is no guarding.  Musculoskeletal:        General: No deformity.     Cervical back: No rigidity.     Comments: Bilateral DP pulses intact   No pain to bilateral hips.  Severe pain when trying to lift her right leg off the stretcher  Skin:    General: Skin is warm and dry.     Coloration: Skin is not cyanotic, jaundiced or pale.  Neurological:     Mental Status: She is alert and oriented to person, place, and time.  GCS: GCS eye subscore is 4. GCS verbal subscore is 5. GCS motor subscore is 6.  Psychiatric:        Speech: Speech normal.        Behavior: Behavior normal. Behavior is cooperative.     ED Results and Treatments Labs (all labs ordered are listed, but only abnormal results are displayed) Labs Reviewed  CBC WITH DIFFERENTIAL/PLATELET - Abnormal; Notable for the following components:      Result Value   WBC 3.2 (*)    Hemoglobin 11.8 (*)    Platelets 68 (*)    Lymphs Abs 0.6 (*)    All other components within normal limits  BASIC METABOLIC PANEL WITH GFR - Abnormal; Notable for the following components:   Sodium 133 (*)    CO2 21 (*)    Glucose, Bld 178 (*)    All other components within normal limits                                                                                                                          Radiology DG Knee Complete 4 Views Right Result Date: 05/01/2023 CLINICAL DATA:  fall EXAM: RIGHT KNEE - COMPLETE 4+ VIEW COMPARISON:  X-ray right knee 04/07/2023 FINDINGS: Total right  knee arthroplasty. Acute periprosthetic half shaft width posteriorly displaced and comminuted fracture of the distal femoral metadiaphysis. Acute minimally displaced fibular neck fracture. No dislocation no evidence of arthropathy or other focal bone abnormality. Soft tissues are unremarkable. IMPRESSION: 1. Total right knee arthroplasty with acute periprosthetic half shaft width posteriorly displaced and comminuted fracture of the distal femoral metadiaphysis. 2. Acute minimally displaced fibular neck fracture. Electronically Signed   By: Tish Frederickson M.D.   On: 05/01/2023 14:00    Pertinent labs & imaging results that were available during my care of the patient were reviewed by me and considered in my medical decision making (see MDM for details).  Medications Ordered in ED Medications  HYDROcodone-acetaminophen (NORCO/VICODIN) 5-325 MG per tablet 1 tablet (1 tablet Oral Given 05/01/23 1308)  morphine (PF) 2 MG/ML injection 2 mg (2 mg Intravenous Given 05/01/23 1414)  ondansetron (ZOFRAN) injection 4 mg (4 mg Intravenous Given 05/01/23 1413)                                                                                                                                     Procedures Procedures  (  including critical care time)  Medical Decision Making / ED Course    Medical Decision Making:    Breezie A Michalik is a 73 y.o. female with past medical history as below, significant for diabetic neuropathy, DM, obesity, gout, prior total knee bilateral who presents to the ED with complaint of fall, knee injury. The complaint involves an extensive differential diagnosis and also carries with it a high risk of complications and morbidity.  Serious etiology was considered. Ddx includes but is not limited to: Fracture, dislocation, sprain, strain, contusion, etc.  Complete initial physical exam performed, notably the patient was in mild distress secondary to pain.    Reviewed and confirmed nursing  documentation for past medical history, family history, social history.  Vital signs reviewed.      Clinical Course as of 05/01/23 1434  Sun May 01, 2023  1345 Periprosthetic fx noted on xr [SG]  1413 Spoke w/ dr Steward Drone, knee immobilizer and npo @ [facility] for or tomorrow  [SG]    Clinical Course User Index [SG] Sloan Leiter, DO    Brief summary: 73 year old female with apparent mechanical fall just prior to arrival.  Severe pain to her right knee.  LE NVI.  Brisk pulses bilateral to DP/ PT.  Knee x-ray with periprosthetic fracture just proximal to the prosthesis to the femur.  Spoke with Dr. Steward Drone, recommends n.p.o. at midnight, knee immobilizer, recommend medical admission, plan for operating room tomorrow  Spoke With hospitalist Dr. Ella Jubilee who accepts patient for admission               Additional history obtained: -Additional history obtained from family -External records from outside source obtained and reviewed including: Chart review including previous notes, labs, imaging, consultation notes including  Home medications, prior labs, per orthopedic documentation   Lab Tests: -I ordered, reviewed, and interpreted labs.   The pertinent results include:   Labs Reviewed  CBC WITH DIFFERENTIAL/PLATELET - Abnormal; Notable for the following components:      Result Value   WBC 3.2 (*)    Hemoglobin 11.8 (*)    Platelets 68 (*)    Lymphs Abs 0.6 (*)    All other components within normal limits  BASIC METABOLIC PANEL WITH GFR - Abnormal; Notable for the following components:   Sodium 133 (*)    CO2 21 (*)    Glucose, Bld 178 (*)    All other components within normal limits    Notable for labs are stable  EKG   EKG Interpretation Date/Time:    Ventricular Rate:    PR Interval:    QRS Duration:    QT Interval:    QTC Calculation:   R Axis:      Text Interpretation:           Imaging Studies ordered: I ordered imaging studies including knee x-ray  right I independently visualized the following imaging with scope of interpretation limited to determining acute life threatening conditions related to emergency care; findings noted above I independently visualized and interpreted imaging. I agree with the radiologist interpretation   Medicines ordered and prescription drug management: Meds ordered this encounter  Medications   HYDROcodone-acetaminophen (NORCO/VICODIN) 5-325 MG per tablet 1 tablet    Refill:  0   morphine (PF) 2 MG/ML injection 2 mg   ondansetron (ZOFRAN) injection 4 mg    -I have reviewed the patients home medicines and have made adjustments as needed   Consultations Obtained: I requested consultation with the orthopedics,  and discussed lab and imaging findings as well as pertinent plan - they recommend: Admit   Cardiac Monitoring: Continuous pulse oximetry interpreted by myself, 100% on RA.    Social Determinants of Health:  Diagnosis or treatment significantly limited by social determinants of health: obesity   Reevaluation: After the interventions noted above, I reevaluated the patient and found that they have improved  Co morbidities that complicate the patient evaluation  Past Medical History:  Diagnosis Date   Abdominal wall contusion 12/09/2011   Achilles tendon contracture, bilateral 06/03/2016   Anxiety    Arthritis    Blood dyscrasia    low platelet count- sees [name] , Dr. Nelva Nay ( Note in EPIC from 07/2014) at [facility]   Breast cancer [facility]) 2017   Left Breast   Breast cancer (HCC) 07/2021   right breast IDC   Cancer (HCC)    left breast   Chest wall contusion 12/09/2011   Cirrhosis of liver (HCC)    Cough    Diabetes mellitus without complication (HCC)    Diabetic neuropathy (HCC)    Diarrhea due to drug 01/29/2021   Dyspnea    Gout    Gout    History of radiation therapy 10/22/15 - 12/08/15   left breast 50.4 Gy, boost to 10 Gy   History of radiation  therapy    Right breast- 09/22/21-10/30/21- Dr. 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   Neuromuscular disorder (HCC)    diabetic neuropathy   Neuropathy    Personal history of radiation therapy 2017   Left Breast Cancer   Posterior tibial tendon dysfunction (PTTD) of right lower extremity 10/09/2019   Sleep apnea    CPAP nightly   Spleen enlarged    Thyroid disease       Dispostion: Disposition decision including need for hospitalization was considered, and patient admitted to the hospital.    Final Clinical Impression(s) / ED Diagnoses Final diagnoses:  Other closed fracture of right femur, unspecified portion of femur, initial encounter (HCC)        Sloan Leiter, DO 05/01/23 1434

## 2023-05-01 NOTE — H&P (Addendum)
 History and Physical    Patient: [name] [name] JYN:[id number] DOB: 1950/11/08 DOA: 05/01/2023 DOS: the patient was seen and examined on 05/01/2023 PCP: Anabel Halon, MD  Patient coming from: Home  Chief Complaint:  Chief Complaint  Patient presents with   Knee Pain   HPI: [name] [name] is a 73 y.o. female with medical history significant of ambulatory dysfunction, left foot droop, obesity, history of breast cancer, T2DM, and ambulatory dysfunction who presented after a fall.  Patient has been at her usual state of health until this morning when she sustained a fall while trying to get into her family's truck.  She was using steps to get up into the vehicle, she she missed her right step and landed on her right knee. She heard a poop and then experienced local severe pain. She was not able to move her left leg.   Her family was at her side and called EMS. A splint was placed on her right leg by EMS and she was transported to the ED.   She uses a wheelchair for mobility for several years due to back pain and left foot droop. She is able to make a few steps using a walker inside her home, but most of the time needs a wheelchair.  Recently she was diagnosed with vertigo.    Review of Systems: unable to review all systems due to the inability of the patient to answer questions. Past Medical History:  Diagnosis Date   Abdominal wall contusion 12/09/2011   Achilles tendon contracture, bilateral 06/03/2016   Anxiety    Arthritis    Blood dyscrasia    low platelet count- sees [name] , Dr. Nelva Nay ( Note in EPIC from 07/2014) at [facility]   Breast cancer [facility]) 2017   Left Breast   Breast cancer (HCC) 07/2021   right breast IDC   Cancer (HCC)    left breast   Chest wall contusion 12/09/2011   Cirrhosis of liver (HCC)    Cough    Diabetes mellitus without complication (HCC)    Diabetic neuropathy (HCC)    Diarrhea due to drug 01/29/2021   Dyspnea    Gout    Gout     History of radiation therapy 10/22/15 - 12/08/15   left breast 50.4 Gy, boost to 10 Gy   History of radiation therapy    Right breast- 09/22/21-10/30/21- Dr. 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   Neuromuscular disorder (HCC)    diabetic neuropathy   Neuropathy    Personal history of radiation therapy 2017   Left Breast Cancer   Posterior tibial tendon dysfunction (PTTD) of right lower extremity 10/09/2019   Sleep apnea    CPAP nightly   Spleen enlarged    Thyroid disease    Past Surgical History:  Procedure Laterality Date   ANKLE FUSION Right 2022   BIOPSY  10/19/2022   Procedure: BIOPSY;  Surgeon: Dolores Frame, MD;  Location: [facility];  Service: Gastroenterology;;   BREAST BIOPSY Left 08/04/2015   Procedure: LEFT BREAST BIOPSY WITH NEEDLE LOCALIZATION;  Surgeon: Darnell Level, MD;  Location: [facility];  Service: General;  Laterality: Left;   BREAST BIOPSY Right 06/23/2021   BREAST DUCTAL SYSTEM EXCISION Left 08/04/2015   Procedure: LEFT EXCISION DUCTAL SYSTEM BREAST;  Surgeon: Darnell Level, MD;  Location: [facility];  Service: General;  Laterality: Left;  BREAST LUMPECTOMY Left 08/2015   BREAST LUMPECTOMY Right 08/2021   BREAST LUMPECTOMY WITH AXILLARY LYMPH NODE BIOPSY Left 09/12/2015   Procedure: RE-EXCISION OF LEFT BREAST LUMPECTOMY WITH LEFT AXILLARY LYMPH NODE BIOPSY;  Surgeon: Darnell Level, MD;  Location: [facility];  Service: General;  Laterality: Left;   BREAST LUMPECTOMY WITH RADIOACTIVE SEED LOCALIZATION Right 08/12/2021   Procedure: RIGHT BREAST LUMPECTOMY WITH RADIOACTIVE SEED LOCALIZATION;  Surgeon: Abigail Miyamoto, MD;  Location: [facility];  Service: General;  Laterality: Right;  LMA   CHOLECYSTECTOMY     ESOPHAGOGASTRODUODENOSCOPY (EGD) WITH PROPOFOL N/A 10/19/2022   Procedure: ESOPHAGOGASTRODUODENOSCOPY (EGD) WITH  PROPOFOL;  Surgeon: Dolores Frame, MD;  Location: [facility];  Service: Gastroenterology;  Laterality: N/A;  10:30am asa 3   EYE SURGERY Bilateral 2022   Lashes growing backwards into the eye   JOINT REPLACEMENT     left knee   JOINT REPLACEMENT  2017   right knee   REVERSE SHOULDER ARTHROPLASTY Right 09/07/2018   Procedure: RIGHT REVERSE SHOULDER ARTHROPLASTY;  Surgeon: Cammy Copa, MD;  Location: [facility];  Service: Orthopedics;  Laterality: Right;   TOTAL KNEE ARTHROPLASTY Right 02/28/2015   Procedure: RIGHT TOTAL KNEE ARTHROPLASTY;  Surgeon: Kathryne Hitch, MD;  Location: [facility];  Service: Orthopedics;  Laterality: Right;   TUBAL LIGATION     Social History:  reports that she has never smoked. She has never been exposed to tobacco smoke. She has never used smokeless tobacco. She reports that she does not drink alcohol and does not use drugs.  Allergies  Allergen Reactions   Codeine Swelling   Influenza A (H1n1) Monoval Pf Anaphylaxis   Hydrocodone-Acetaminophen Other (See Comments)   Lisinopril Other (See Comments)   Metformin Other (See Comments)   Pregabalin Other (See Comments)   Jardiance [Empagliflozin] Other (See Comments)    dehydration    Family History  Problem Relation Age of Onset   Hypothyroidism Mother    Alzheimer's disease Mother    Diabetes Father    Cancer Sister        GYN cancer   Hypothyroidism Sister    Heart disease Brother    Arthritis Son    Diabetes Son    Arthritis Son    Cancer Maternal Grandmother    Heart failure Maternal Grandfather    Bone cancer Paternal Grandmother    Heart failure Paternal Grandmother     Prior to Admission medications   Medication Sig Start Date End Date Taking? Authorizing Provider  ACCU-CHEK AVIVA PLUS test strip USE AS INSTRUCTED TO CHECK BLOOD SUGAR 2 TIMES DAILY 08/13/21   Shamleffer, Konrad Dolores, MD  acetaminophen (TYLENOL) 500 MG tablet Take by mouth as needed. 10/18/19    [provider]  albuterol (VENTOLIN HFA) 108 (90 Base) MCG/ACT inhaler Inhale 1-2 puffs into the lungs every 4 (four) hours as needed for wheezing or shortness of breath. 11/03/21   Anabel Halon, MD  Ascorbic Acid (VITAMIN C PO) Take by mouth daily.    [provider]  buPROPion (WELLBUTRIN XL) 150 MG 24 hr tablet TAKE 1 TABLET BY MOUTH EVERY DAY IN THE MORNING 05/03/22   Anabel Halon, MD  carvedilol (COREG) 3.125 MG tablet TAKE 1 TABLET BY MOUTH TWICE A DAY WITH FOOD 04/18/23   Carlan, Chelsea L, NP  Cyanocobalamin (VITAMIN B-12 PO) Take by mouth daily.    [provider]  exemestane (AROMASIN) 25 MG tablet TAKE 1 TABLET (25 MG  TOTAL) BY MOUTH DAILY AFTER BREAKFAST. 12/27/22   Iruku, Burnice Logan, MD  FARXIGA 10 MG TABS tablet TAKE 1 TABLET BY MOUTH DAILY BEFORE BREAKFAST. 12/07/21   Shamleffer, Konrad Dolores, MD  fluticasone (FLONASE) 50 MCG/ACT nasal spray Place 1 spray into both nostrils as needed.  05/02/18   [provider]  gabapentin (NEURONTIN) 600 MG tablet TAKE 1 TABLET IN THE MORNING AND 1 TABLET IN THE AFTERNOON AND 2TABS AT BEDTIME    [provider]  insulin glargine (LANTUS SOLOSTAR) 100 UNIT/ML Solostar Pen Inject 62 Units into the skin daily. 06/23/22   Shamleffer, Konrad Dolores, MD  Insulin Pen Needle (B-D UF III MINI PEN NEEDLES) 31G X 5 MM MISC USE AS DIRECTED WITH INSULIN EVERY MORNING, NOON, EVENING AND AT BEDTIME 12/14/21   Shamleffer, Konrad Dolores, MD  Insulin Pen Needle 30G X 5 MM MISC 1 Device by Does not apply route in the morning, at noon, in the evening, and at bedtime. 12/14/21   Shamleffer, Konrad Dolores, MD  ketoconazole (NIZORAL) 2 % cream Apply 1 Application topically daily. 11/03/21   Anabel Halon, MD  levothyroxine (SYNTHROID) 150 MCG tablet Take 1 tablet (150 mcg total) by mouth daily. 12/14/21   Shamleffer, Konrad Dolores, MD  losartan (COZAAR) 50 MG tablet Take 1 tablet (50 mg total) by mouth daily. 03/30/23    Anabel Halon, MD  meclizine (ANTIVERT) 25 MG tablet Take 1 tablet (25 mg total) by mouth 3 (three) times daily as needed for dizziness. 03/30/23   Anabel Halon, MD  methocarbamol (ROBAXIN) 500 MG tablet Take 1 tablet (500 mg total) by mouth every 8 (eight) hours as needed for muscle spasms. 09/24/21   Kirtland Bouchard, PA-C  mirabegron ER (MYRBETRIQ) 25 MG TB24 tablet Take 1 tablet (25 mg total) by mouth daily. 08/24/22   Donnita Falls, FNP  Multiple Vitamins-Minerals (MULTIVITAMIN WITH MINERALS) tablet Take 1 tablet by mouth daily.    [provider]  mycophenolate (CELLCEPT) 500 MG tablet Take 1,500 mg by mouth 2 (two) times daily. 04/02/21   [provider]  omeprazole (PRILOSEC) 20 MG capsule Take 1 capsule (20 mg total) by mouth daily. 10/20/22   Dolores Frame, MD  promethazine-dextromethorphan (PROMETHAZINE-DM) 6.25-15 MG/5ML syrup Take 5 mLs by mouth 4 (four) times daily as needed for cough. 02/03/22   Anabel Halon, MD  Pyridoxine HCl (VITAMIN B-6 PO) Take by mouth daily.    [provider]  Semaglutide, 1 MG/DOSE, (OZEMPIC, 1 MG/DOSE,) 4 MG/3ML SOPN Inject 1 mg into the skin once a week. 04/26/23     traMADol (ULTRAM) 50 MG tablet Take 1 tablet (50 mg total) by mouth every 6 (six) hours as needed. 04/18/23   Kirtland Bouchard, PA-C  UNABLE TO FIND 1 each by Does not apply route daily. Med Name: Iantha Fallen  DX [id number] 04/22/22   Anabel Halon, MD  VITAMIN D PO Take by mouth daily.    [provider]    Physical Exam: Vitals:   05/01/23 1302  BP: (!) 188/86  Pulse: 81  Resp: 18  Temp: 97.9 F (36.6 C)  TempSrc: Oral  SpO2: 100%   Neurology awake and alert, deconditioned and in pain  ENT with mild pallor, dry mucous membranes Cardiovascular with S1 and S2 present and regular with no gallops, rubs or murmurs Respiratory with no rales or wheezing, no rhonchi Abdomen with no distention  Right leg with brace in place.  No  visible deformities.   Data Reviewed:   Na 133, K 3,9 Cl 103 bicarbonate 21 glucose 178 bun 17 cr 0,76  Wbc 3,2 hgb 11,8 plt 68   Right knee radiograph with total right knee arthroplasty with acute periprosthetic half shaft width posteriorly displaced and comminuted fracture of the distal femoral metadiaphysis.  Acute minimally displaced fibular neck fracture.   Assessment and Plan: * Right femoral fracture (HCC) Continue pain control with acetaminophen Hold on non steroidal anti inflammatory agents due to thrombocytopenia.   For break through pain oxycodone and hydromorphone.  As needed muscle relaxants.  GI and DVT prophylaxis.  Follow up with orthopedics recommendations.  NPO past midnight.   Essential hypertension Continue blood pressure control with losartan and carvedilol.    Type 2 diabetes mellitus with hyperlipidemia (HCC) Continue glucose cover and monitoring with insulin sliding scale Low dose basal insulin to start post surgery  Hold on SGLT 2 inh for now.  Diabetic neuropathy, continue with gabapentin   Thrombocytopenia (HCC) Close follow up on cell counts.  Patient with anemia and leukopenia, pancytopenia.   Class 3 obesity Calculate BMI   Malignant neoplasm of upper-outer quadrant of left breast in female, estrogen receptor positive (HCC) Follow up as outpatient  Continue with aromasin   Vertigo Follow up with PT and Ot post surgical intervention   Cirrhosis of liver without ascites (HCC) Likely NASH, no signs of acute decompensation  Follow up as outpatient.   Hypothyroidism Continue with levothyroxine       Advance Care Planning:   Code Status: Full Code   Consults: orthopedics in the ED   Family Communication: I spoke with patient's daughter at the bedside, we talked in detail about patient's condition, plan of care and prognosis and all questions were addressed.   Severity of Illness: The appropriate patient status for this patient is  INPATIENT. Inpatient status is judged to be reasonable and necessary in order to provide the required intensity of service to ensure the patient's safety. The patient's presenting symptoms, physical exam findings, and initial radiographic and laboratory data in the context of their chronic comorbidities is felt to place them at high risk for further clinical deterioration. Furthermore, it is not anticipated that the patient will be medically stable for discharge from the hospital within 2 midnights of admission.   * I certify that at the point of admission it is my clinical judgment that the patient will require inpatient hospital care spanning beyond 2 midnights from the point of admission due to high intensity of service, high risk for further deterioration and high frequency of surveillance required.*  Author: Coralie Keens, MD 05/01/2023 2:27 PM  For on call review [email or web address].

## 2023-05-01 NOTE — Assessment & Plan Note (Signed)
 Close follow up on cell counts.  Patient with anemia and leukopenia, pancytopenia.

## 2023-05-01 NOTE — Assessment & Plan Note (Signed)
 Continue blood pressure control with losartan and carvedilol.

## 2023-05-01 NOTE — Assessment & Plan Note (Addendum)
 Continue glucose cover and monitoring with insulin sliding scale Low dose basal insulin to start post surgery  Hold on SGLT 2 inh for now.  Diabetic neuropathy, continue with gabapentin

## 2023-05-01 NOTE — Progress Notes (Signed)
 Patient arrived to unit from ED, husband at bedside and family. MD came to room when pt arrived pt is on room air, pt oriented to room and call bell use, bed in low position, wheels locked, alarm on bed and call bell within reach.

## 2023-05-01 NOTE — ED Triage Notes (Signed)
 Pt bib ems from home c.o right knee pain s/p fall after attempting to get into a truck. Splint placed by EMS. No pain meds given en route. No head injury, no Loc or blood thinners.

## 2023-05-01 NOTE — ED Notes (Signed)
 ED Provider at bedside.

## 2023-05-01 NOTE — Assessment & Plan Note (Signed)
 Follow up with PT and Ot post surgical intervention

## 2023-05-01 NOTE — Assessment & Plan Note (Addendum)
 Continue pain control with acetaminophen Hold on non steroidal anti inflammatory agents due to thrombocytopenia.   For break through pain oxycodone and hydromorphone.  As needed muscle relaxants.  GI and DVT prophylaxis.  Follow up with orthopedics recommendations.  NPO past midnight.

## 2023-05-01 NOTE — Assessment & Plan Note (Signed)
 Calculate BMI

## 2023-05-02 ENCOUNTER — Inpatient Hospital Stay (HOSPITAL_COMMUNITY): Admitting: Anesthesiology

## 2023-05-02 ENCOUNTER — Inpatient Hospital Stay (HOSPITAL_COMMUNITY)

## 2023-05-02 ENCOUNTER — Other Ambulatory Visit: Payer: Self-pay

## 2023-05-02 ENCOUNTER — Encounter (HOSPITAL_COMMUNITY)
Admission: EM | Disposition: A | Discharge status: Skilled Nursing Facility | Payer: Self-pay | Source: Home / Self Care | Attending: Internal Medicine

## 2023-05-02 ENCOUNTER — Encounter (HOSPITAL_COMMUNITY): Payer: Self-pay | Admitting: Internal Medicine

## 2023-05-02 DIAGNOSIS — J449 Chronic obstructive pulmonary disease, unspecified: Secondary | ICD-10-CM | POA: Diagnosis not present

## 2023-05-02 DIAGNOSIS — F418 Other specified anxiety disorders: Secondary | ICD-10-CM | POA: Diagnosis not present

## 2023-05-02 DIAGNOSIS — S72451A Displaced supracondylar fracture without intracondylar extension of lower end of right femur, initial encounter for closed fracture: Secondary | ICD-10-CM | POA: Diagnosis not present

## 2023-05-02 DIAGNOSIS — S72401A Unspecified fracture of lower end of right femur, initial encounter for closed fracture: Secondary | ICD-10-CM

## 2023-05-02 DIAGNOSIS — I10 Essential (primary) hypertension: Secondary | ICD-10-CM

## 2023-05-02 DIAGNOSIS — S728X1A Other fracture of right femur, initial encounter for closed fracture: Secondary | ICD-10-CM | POA: Diagnosis not present

## 2023-05-02 DIAGNOSIS — S72001A Fracture of unspecified part of neck of right femur, initial encounter for closed fracture: Secondary | ICD-10-CM | POA: Diagnosis not present

## 2023-05-02 HISTORY — PX: ORIF FEMUR FRACTURE: SHX2119

## 2023-05-02 LAB — CBC
HCT: 35.2 % — ABNORMAL LOW (ref 36.0–46.0)
Hemoglobin: 10.9 g/dL — ABNORMAL LOW (ref 12.0–15.0)
MCH: 27.7 pg (ref 26.0–34.0)
MCHC: 31 g/dL (ref 30.0–36.0)
MCV: 89.3 fL (ref 80.0–100.0)
Platelets: 126 10*3/uL — ABNORMAL LOW (ref 150–400)
RBC: 3.94 MIL/uL (ref 3.87–5.11)
RDW: 15.4 % (ref 11.5–15.5)
WBC: 6.9 10*3/uL (ref 4.0–10.5)
nRBC: 0 % (ref 0.0–0.2)

## 2023-05-02 LAB — BASIC METABOLIC PANEL WITH GFR
Anion gap: 9 (ref 5–15)
BUN: 24 mg/dL — ABNORMAL HIGH (ref 8–23)
CO2: 24 mmol/L (ref 22–32)
Calcium: 9.6 mg/dL (ref 8.9–10.3)
Chloride: 102 mmol/L (ref 98–111)
Creatinine, Ser: 1.72 mg/dL — ABNORMAL HIGH (ref 0.44–1.00)
GFR, Estimated: 31 mL/min — ABNORMAL LOW (ref >60)
Glucose, Bld: 172 mg/dL — ABNORMAL HIGH (ref 70–99)
Potassium: 4.7 mmol/L (ref 3.5–5.1)
Sodium: 135 mmol/L (ref 135–145)

## 2023-05-02 LAB — TYPE AND SCREEN
ABO/RH(D): O POS
Antibody Screen: NEGATIVE

## 2023-05-02 LAB — GLUCOSE, CAPILLARY
Glucose-Capillary: 161 mg/dL — ABNORMAL HIGH (ref 70–99)
Glucose-Capillary: 146 mg/dL — ABNORMAL HIGH (ref 70–99)
Glucose-Capillary: 128 mg/dL — ABNORMAL HIGH (ref 70–99)
Glucose-Capillary: 142 mg/dL — ABNORMAL HIGH (ref 70–99)

## 2023-05-02 LAB — SURGICAL PCR SCREEN
MRSA, PCR: NEGATIVE
Staphylococcus aureus: POSITIVE — AB

## 2023-05-02 SURGERY — OPEN REDUCTION INTERNAL FIXATION (ORIF) DISTAL FEMUR FRACTURE
Anesthesia: General | Site: Thigh | Laterality: Right

## 2023-05-02 MED ORDER — VANCOMYCIN HCL 1000 MG IV SOLR
INTRAVENOUS | Status: DC | PRN
  Administered 2023-05-02: 1000 mg

## 2023-05-02 MED ORDER — CHLORHEXIDINE GLUCONATE 0.12 % MT SOLN
15.0000 mL | Freq: Once | OROMUCOSAL | Status: AC
  Administered 2023-05-02: 15 mL via OROMUCOSAL
  Filled 2023-05-02: qty 15

## 2023-05-02 MED ORDER — VANCOMYCIN HCL 1000 MG IV SOLR
INTRAVENOUS | Status: AC
  Filled 2023-05-02: qty 20

## 2023-05-02 MED ORDER — HYDROMORPHONE HCL 1 MG/ML IJ SOLN: 0.2500 mg | INTRAMUSCULAR | Status: DC | PRN

## 2023-05-02 MED ORDER — MEPERIDINE HCL 25 MG/ML IJ SOLN: 6.2500 mg | INTRAMUSCULAR | Status: DC | PRN

## 2023-05-02 MED ORDER — ONDANSETRON HCL 4 MG/2ML IJ SOLN
INTRAMUSCULAR | Status: DC | PRN
  Administered 2023-05-02: 4 mg via INTRAVENOUS

## 2023-05-02 MED ORDER — ALBUTEROL SULFATE HFA 108 (90 BASE) MCG/ACT IN AERS
INHALATION_SPRAY | RESPIRATORY_TRACT | Status: DC | PRN
Start: 2023-05-02 — End: 2023-05-02
  Administered 2023-05-02: 4 via RESPIRATORY_TRACT

## 2023-05-02 MED ORDER — LIDOCAINE 2% (20 MG/ML) 5 ML SYRINGE
INTRAMUSCULAR | Status: AC
  Filled 2023-05-02: qty 5

## 2023-05-02 MED ORDER — PHENYLEPHRINE 80 MCG/ML (10ML) SYRINGE FOR IV PUSH (FOR BLOOD PRESSURE SUPPORT)
PREFILLED_SYRINGE | INTRAVENOUS | Status: AC
  Filled 2023-05-02: qty 10

## 2023-05-02 MED ORDER — LIDOCAINE 2% (20 MG/ML) 5 ML SYRINGE
INTRAMUSCULAR | Status: DC | PRN
  Administered 2023-05-02: 60 mg via INTRAVENOUS

## 2023-05-02 MED ORDER — PHENYLEPHRINE 80 MCG/ML (10ML) SYRINGE FOR IV PUSH (FOR BLOOD PRESSURE SUPPORT)
PREFILLED_SYRINGE | INTRAVENOUS | Status: DC | PRN
  Administered 2023-05-02: 160 ug via INTRAVENOUS
  Administered 2023-05-02: 80 ug via INTRAVENOUS
  Administered 2023-05-02: 160 ug via INTRAVENOUS

## 2023-05-02 MED ORDER — ONDANSETRON HCL 4 MG/2ML IJ SOLN
INTRAMUSCULAR | Status: AC
  Filled 2023-05-02: qty 2

## 2023-05-02 MED ORDER — CHLORHEXIDINE GLUCONATE CLOTH 2 % EX PADS
6.0000 | MEDICATED_PAD | Freq: Every day | CUTANEOUS | Status: AC
  Administered 2023-05-02 – 2023-05-06 (×5): 6 via TOPICAL

## 2023-05-02 MED ORDER — METOCLOPRAMIDE HCL 5 MG/ML IJ SOLN
INTRAMUSCULAR | Status: DC | PRN
  Administered 2023-05-02: 10 mg via INTRAVENOUS

## 2023-05-02 MED ORDER — FENTANYL CITRATE (PF) 250 MCG/5ML IJ SOLN
INTRAMUSCULAR | Status: DC | PRN
Start: 2023-05-02 — End: 2023-05-02
  Administered 2023-05-02 (×2): 50 ug via INTRAVENOUS

## 2023-05-02 MED ORDER — VASOPRESSIN 20 UNIT/ML IV SOLN
INTRAVENOUS | Status: AC
  Filled 2023-05-02: qty 1

## 2023-05-02 MED ORDER — DEXTROSE 5 % IV SOLN
INTRAVENOUS | Status: DC | PRN
  Administered 2023-05-02: 3 g via INTRAVENOUS

## 2023-05-02 MED ORDER — SUGAMMADEX SODIUM 200 MG/2ML IV SOLN
INTRAVENOUS | Status: DC | PRN
  Administered 2023-05-02: 225 mg via INTRAVENOUS

## 2023-05-02 MED ORDER — ROCURONIUM BROMIDE 10 MG/ML (PF) SYRINGE
PREFILLED_SYRINGE | INTRAVENOUS | Status: DC | PRN
  Administered 2023-05-02: 40 mg via INTRAVENOUS
  Administered 2023-05-02: 10 mg via INTRAVENOUS

## 2023-05-02 MED ORDER — IPRATROPIUM-ALBUTEROL 0.5-2.5 (3) MG/3ML IN SOLN
3.0000 mL | Freq: Once | RESPIRATORY_TRACT | Status: AC | PRN
  Administered 2023-05-02: 3 mL via RESPIRATORY_TRACT
  Filled 2023-05-02: qty 3

## 2023-05-02 MED ORDER — INSULIN ASPART 100 UNIT/ML IJ SOLN: 0.0000 [IU] | INTRAMUSCULAR | Status: DC | PRN

## 2023-05-02 MED ORDER — CALCIUM CHLORIDE 10 % IV SOLN
INTRAVENOUS | Status: AC
  Filled 2023-05-02: qty 10

## 2023-05-02 MED ORDER — DEXAMETHASONE SODIUM PHOSPHATE 10 MG/ML IJ SOLN
INTRAMUSCULAR | Status: AC
  Filled 2023-05-02: qty 1

## 2023-05-02 MED ORDER — PROPOFOL 10 MG/ML IV BOLUS
INTRAVENOUS | Status: DC | PRN
  Administered 2023-05-02: 100 mg via INTRAVENOUS

## 2023-05-02 MED ORDER — OXYCODONE HCL 5 MG PO TABS: 5.0000 mg | ORAL_TABLET | Freq: Once | ORAL | Status: DC | PRN

## 2023-05-02 MED ORDER — DEXTROSE 5 % IV SOLN
3.0000 g | Freq: Once | INTRAVENOUS | Status: DC
  Filled 2023-05-02: qty 3000

## 2023-05-02 MED ORDER — PHENYLEPHRINE HCL-NACL 20-0.9 MG/250ML-% IV SOLN
INTRAVENOUS | Status: DC | PRN
  Administered 2023-05-02: 40 ug/min via INTRAVENOUS
  Administered 2023-05-02: 50 ug/min via INTRAVENOUS

## 2023-05-02 MED ORDER — LACTATED RINGERS IV SOLN: INTRAVENOUS | Status: DC

## 2023-05-02 MED ORDER — ROCURONIUM BROMIDE 10 MG/ML (PF) SYRINGE
PREFILLED_SYRINGE | INTRAVENOUS | Status: AC
  Filled 2023-05-02: qty 10

## 2023-05-02 MED ORDER — 0.9 % SODIUM CHLORIDE (POUR BTL) OPTIME
TOPICAL | Status: DC | PRN
  Administered 2023-05-02: 1000 mL

## 2023-05-02 MED ORDER — MIDAZOLAM HCL 2 MG/2ML IJ SOLN: 0.5000 mg | Freq: Once | INTRAMUSCULAR | Status: DC | PRN

## 2023-05-02 MED ORDER — CALCIUM CHLORIDE 10 % IV SOLN
INTRAVENOUS | Status: DC | PRN
  Administered 2023-05-02: 200 mg via INTRAVENOUS

## 2023-05-02 MED ORDER — VASOPRESSIN 20 UNIT/ML IV SOLN
INTRAVENOUS | Status: DC | PRN
Start: 2023-05-02 — End: 2023-05-02
  Administered 2023-05-02 (×6): 1 [IU] via INTRAVENOUS

## 2023-05-02 MED ORDER — CEFAZOLIN SODIUM-DEXTROSE 2-4 GM/100ML-% IV SOLN
2.0000 g | Freq: Three times a day (TID) | INTRAVENOUS | Status: AC
  Administered 2023-05-02 – 2023-05-03 (×2): 2 g via INTRAVENOUS
  Filled 2023-05-02 (×2): qty 100

## 2023-05-02 MED ORDER — ORAL CARE MOUTH RINSE: 15.0000 mL | Freq: Once | OROMUCOSAL | Status: AC

## 2023-05-02 MED ORDER — METOCLOPRAMIDE HCL 5 MG/ML IJ SOLN
INTRAMUSCULAR | Status: AC
  Filled 2023-05-02: qty 2

## 2023-05-02 MED ORDER — SODIUM CHLORIDE 0.9 % IV SOLN: INTRAVENOUS | Status: DC

## 2023-05-02 MED ORDER — FENTANYL CITRATE (PF) 250 MCG/5ML IJ SOLN
INTRAMUSCULAR | Status: AC
  Filled 2023-05-02: qty 5

## 2023-05-02 MED ORDER — OXYCODONE HCL 5 MG/5ML PO SOLN: 5.0000 mg | Freq: Once | ORAL | Status: DC | PRN

## 2023-05-02 MED ORDER — MUPIROCIN 2 % EX OINT
1.0000 | TOPICAL_OINTMENT | Freq: Two times a day (BID) | CUTANEOUS | Status: DC
  Administered 2023-05-02 – 2023-05-06 (×9): 1 via NASAL
  Filled 2023-05-02: qty 22

## 2023-05-02 MED ORDER — ALBUMIN HUMAN 5 % IV SOLN: INTRAVENOUS | Status: DC | PRN

## 2023-05-02 SURGICAL SUPPLY — 62 items
BAG COUNTER SPONGE SURGICOUNT (BAG) ×1 IMPLANT
BIT DRILL LONG 3.3 (BIT) IMPLANT
BIT DRILL QC 3.3X195 (BIT) IMPLANT
BLADE CLIPPER SURG (BLADE) IMPLANT
BNDG COHESIVE 6X5 TAN ST LF (GAUZE/BANDAGES/DRESSINGS) ×1 IMPLANT
BNDG ELASTIC 6INX 5YD STR LF (GAUZE/BANDAGES/DRESSINGS) IMPLANT
BNDG ELASTIC 6X10 VLCR STRL LF (GAUZE/BANDAGES/DRESSINGS) ×1 IMPLANT
BNDG GAUZE DERMACEA FLUFF 4 (GAUZE/BANDAGES/DRESSINGS) IMPLANT
BRUSH SCRUB EZ PLAIN DRY (MISCELLANEOUS) ×2 IMPLANT
CANISTER SUCT 3000ML PPV (MISCELLANEOUS) ×1 IMPLANT
CAP LOCK NCB (Cap) IMPLANT
CHLORAPREP W/TINT 26 (MISCELLANEOUS) ×1 IMPLANT
COVER SURGICAL LIGHT HANDLE (MISCELLANEOUS) ×1 IMPLANT
DRAPE C-ARM 42X72 X-RAY (DRAPES) ×1 IMPLANT
DRAPE C-ARMOR (DRAPES) ×1 IMPLANT
DRAPE HALF SHEET 40X57 (DRAPES) ×2 IMPLANT
DRAPE SURG 17X23 STRL (DRAPES) ×1 IMPLANT
DRAPE SURG ORHT 6 SPLT 77X108 (DRAPES) ×2 IMPLANT
DRAPE U-SHAPE 47X51 STRL (DRAPES) ×1 IMPLANT
DRESSING MEPILEX FLEX 4X4 (GAUZE/BANDAGES/DRESSINGS) IMPLANT
DRSG ADAPTIC 3X8 NADH LF (GAUZE/BANDAGES/DRESSINGS) IMPLANT
DRSG MEPILEX FLEX 4X4 (GAUZE/BANDAGES/DRESSINGS) IMPLANT
DRSG MEPILEX POST OP 4X12 (GAUZE/BANDAGES/DRESSINGS) IMPLANT
DRSG MEPILEX POST OP 4X8 (GAUZE/BANDAGES/DRESSINGS) IMPLANT
ELECT REM PT RETURN 9FT ADLT (ELECTROSURGICAL) ×1 IMPLANT
ELECTRODE REM PT RTRN 9FT ADLT (ELECTROSURGICAL) ×1 IMPLANT
GAUZE PAD ABD 8X10 STRL (GAUZE/BANDAGES/DRESSINGS) ×3 IMPLANT
GAUZE SPONGE 4X4 12PLY STRL (GAUZE/BANDAGES/DRESSINGS) ×1 IMPLANT
GAUZE XEROFORM 1X8 LF (GAUZE/BANDAGES/DRESSINGS) IMPLANT
GLOVE BIO SURGEON STRL SZ 6.5 (GLOVE) ×3 IMPLANT
GLOVE BIO SURGEON STRL SZ7.5 (GLOVE) ×4 IMPLANT
GLOVE BIOGEL PI IND STRL 6.5 (GLOVE) ×1 IMPLANT
GLOVE BIOGEL PI IND STRL 7.5 (GLOVE) ×1 IMPLANT
GOWN STRL REUS W/ TWL LRG LVL3 (GOWN DISPOSABLE) ×3 IMPLANT
K-WIRE FXSTD 280X2XNS SS (WIRE) ×1 IMPLANT
KIT BASIN OR (CUSTOM PROCEDURE TRAY) ×1 IMPLANT
KIT TURNOVER KIT B (KITS) ×1 IMPLANT
KWIRE FXSTD 280X2XNS SS (WIRE) IMPLANT
NS IRRIG 1000ML POUR BTL (IV SOLUTION) ×1 IMPLANT
PACK TOTAL JOINT (CUSTOM PROCEDURE TRAY) ×1 IMPLANT
PAD ARMBOARD POSITIONER FOAM (MISCELLANEOUS) ×2 IMPLANT
PAD CAST 4YDX4 CTTN HI CHSV (CAST SUPPLIES) ×1 IMPLANT
PADDING CAST COTTON 6X4 STRL (CAST SUPPLIES) ×1 IMPLANT
PLATE FEMUR DISTAL NCB 5H (Plate) IMPLANT
SCREW 5.0 32MM (Screw) IMPLANT
SCREW 5.0 80MM (Screw) IMPLANT
SCREW CORT NCB SELFTAP 5.0X50 (Screw) IMPLANT
SCREW CORTICAL NCB 5.0X44 (Screw) IMPLANT
SCREW NCB 3.5X75X5X6.2XST (Screw) IMPLANT
SCREW NCB 4.0MX34M (Screw) IMPLANT
SCREW NCB 4.0MX46M (Screw) IMPLANT
SET CYSTO W/LG BORE CLAMP LF (SET/KITS/TRAYS/PACK) IMPLANT
SPONGE T-LAP 18X18 ~~LOC~~+RFID (SPONGE) IMPLANT
STAPLER VISISTAT 35W (STAPLE) ×1 IMPLANT
SUCTION TUBE FRAZIER 10FR DISP (SUCTIONS) ×1 IMPLANT
SUT ETHILON 3 0 PS 1 (SUTURE) ×2 IMPLANT
SUT VIC AB 0 CT1 27XBRD ANBCTR (SUTURE) IMPLANT
SUT VIC AB 1 CT1 27XBRD ANBCTR (SUTURE) IMPLANT
SUT VIC AB 2-0 CT1 TAPERPNT 27 (SUTURE) ×2 IMPLANT
TOWEL GREEN STERILE (TOWEL DISPOSABLE) ×2 IMPLANT
TRAY FOLEY MTR SLVR 16FR STAT (SET/KITS/TRAYS/PACK) IMPLANT
WATER STERILE IRR 1000ML POUR (IV SOLUTION) ×2 IMPLANT

## 2023-05-02 NOTE — Interval H&P Note (Signed)
 History and Physical Interval Note:  05/02/2023 4:24 PM  [name] [name]  has presented today for surgery, with the diagnosis of Right Femur Fx.  The various methods of treatment have been discussed with the patient and family. After consideration of risks, benefits and other options for treatment, the patient has consented to  Procedure(s): OPEN REDUCTION INTERNAL FIXATION (ORIF) DISTAL FEMUR FRACTURE (Right) as a surgical intervention.  The patient's history has been reviewed, patient examined, no change in status, stable for surgery.  I have reviewed the patient's chart and labs.  Questions were answered to the patient's satisfaction.     Huel Cote

## 2023-05-02 NOTE — Progress Notes (Signed)
 Transition of Care [facility]) - CAGE-AID Screening   Patient Details  Name: [name] [name] MRN: [id number] Date of Birth: 1950-09-20  Transition of Care [facility]) CM/SW Contact:    Leota Sauers, RN Phone Number: 05/02/2023, 8:16 PM   Clinical Narrative:  Patient denies the use of alcohol and illicit substances. Resources not given at this time.  CAGE-AID Screening:    Have You Ever Felt You Ought to Cut Down on Your Drinking or Drug Use?: No Have People Annoyed You By Critizing Your Drinking Or Drug Use?: No Have You Felt Bad Or Guilty About Your Drinking Or Drug Use?: No Have You Ever Had a Drink or Used Drugs First Thing In The Morning to Steady Your Nerves or to Get Rid of a Hangover?: No CAGE-AID Score: 0  Substance Abuse Education Offered: No

## 2023-05-02 NOTE — Progress Notes (Signed)
 ORTHOPAEDIC CONSULTATION  REQUESTING PHYSICIAN: Briant Cedar, MD  Chief Complaint: Fall, right leg injury  HPI: [name] [name] is a 73 y.o. female who presents with a right periprosthetic distal femur fracture after previous total knee arthroplasty by Dr. Magnus Ivan in 2017.  She has been having pain in the knee and unfortunately did have a fall while getting into her truck.  She proceeded to the emergency room and was placed in a knee immobilizer.  She is here today for operative intervention  Past Medical History:  Diagnosis Date   Abdominal wall contusion 12/09/2011   Achilles tendon contracture, bilateral 06/03/2016   Anxiety    Arthritis    Blood dyscrasia    low platelet count- sees [name] , Dr. Nelva Nay ( Note in EPIC from 07/2014) at [facility]   Breast cancer [facility]) 2017   Left Breast   Breast cancer (HCC) 07/2021   right breast IDC   Cancer (HCC)    left breast   Chest wall contusion 12/09/2011   Cirrhosis of liver (HCC)    Cough    Diabetes mellitus without complication (HCC)    Diabetic neuropathy (HCC)    Diarrhea due to drug 01/29/2021   Dyspnea    Gout    Gout    History of radiation therapy 10/22/15 - 12/08/15   left breast 50.4 Gy, boost to 10 Gy   History of radiation therapy    Right breast- 09/22/21-10/30/21- Dr. 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   Neuromuscular disorder (HCC)    diabetic neuropathy   Neuropathy    Personal history of radiation therapy 2017   Left Breast Cancer   Posterior tibial tendon dysfunction (PTTD) of right lower extremity 10/09/2019   Sleep apnea    CPAP nightly   Spleen enlarged    Thyroid disease    Past Surgical History:  Procedure Laterality Date   ANKLE FUSION Right 2022   BIOPSY  10/19/2022   Procedure: BIOPSY;  Surgeon: Dolores Frame, MD;  Location: [facility];  Service: Gastroenterology;;   BREAST BIOPSY  Left 08/04/2015   Procedure: LEFT BREAST BIOPSY WITH NEEDLE LOCALIZATION;  Surgeon: Darnell Level, MD;  Location: [facility];  Service: General;  Laterality: Left;   BREAST BIOPSY Right 06/23/2021   BREAST DUCTAL SYSTEM EXCISION Left 08/04/2015   Procedure: LEFT EXCISION DUCTAL SYSTEM BREAST;  Surgeon: Darnell Level, MD;  Location: [facility];  Service: General;  Laterality: Left;   BREAST LUMPECTOMY Left 08/2015   BREAST LUMPECTOMY Right 08/2021   BREAST LUMPECTOMY WITH AXILLARY LYMPH NODE BIOPSY Left 09/12/2015   Procedure: RE-EXCISION OF LEFT BREAST LUMPECTOMY WITH LEFT AXILLARY LYMPH NODE BIOPSY;  Surgeon: Darnell Level, MD;  Location: [facility];  Service: General;  Laterality: Left;   BREAST LUMPECTOMY WITH RADIOACTIVE SEED LOCALIZATION Right 08/12/2021   Procedure: RIGHT BREAST LUMPECTOMY WITH RADIOACTIVE SEED LOCALIZATION;  Surgeon: Abigail Miyamoto, MD;  Location: [facility];  Service: General;  Laterality: Right;  LMA   CHOLECYSTECTOMY     ESOPHAGOGASTRODUODENOSCOPY (EGD) WITH PROPOFOL N/A 10/19/2022   Procedure: ESOPHAGOGASTRODUODENOSCOPY (EGD) WITH PROPOFOL;  Surgeon: Dolores Frame, MD;  Location: [facility];  Service: Gastroenterology;  Laterality: N/A;  10:30am asa 3   EYE SURGERY Bilateral 2022   Lashes growing backwards into the eye   JOINT REPLACEMENT     left knee  JOINT REPLACEMENT  2017   right knee   REVERSE SHOULDER ARTHROPLASTY Right 09/07/2018   Procedure: RIGHT REVERSE SHOULDER ARTHROPLASTY;  Surgeon: Cammy Copa, MD;  [facility];  Service: Orthopedics;  Laterality: Right;   TOTAL KNEE ARTHROPLASTY Right 02/28/2015   Procedure: RIGHT TOTAL KNEE ARTHROPLASTY;  Surgeon: Kathryne Hitch, MD;  Location: [facility];  Service: Orthopedics;  Laterality: Right;   TUBAL LIGATION     Social History   Socioeconomic History   Marital status: Married    Spouse name: [name] Pozzi   Number of  children: 3   Years of education: 12th   Highest education level: 12th grade  Occupational History   Occupation: Retired    Comment: Dance movement psychotherapist  Tobacco Use   Smoking status: Never    Passive exposure: Never   Smokeless tobacco: Never  Vaping Use   Vaping status: Never Used  Substance and Sexual Activity   Alcohol use: No   Drug use: No   Sexual activity: Yes    Partners: Male    Birth control/protection: Surgical    Comment: BTL  Other Topics Concern   Not on file  Social History Narrative   Not on file   Social Drivers of Health   Financial Resource Strain: Low Risk  (03/16/2023)   Overall Financial Resource Strain (CARDIA)    Difficulty of Paying Living Expenses: Not hard at all  Food Insecurity: No Food Insecurity (05/01/2023)   Hunger Vital Sign    Worried About Running Out of Food in the Last Year: Never true    Ran Out of Food in the Last Year: Never true  Transportation Needs: No Transportation Needs (05/01/2023)   PRAPARE - Administrator, Civil Service (Medical): No    Lack of Transportation (Non-Medical): No  Physical Activity: Sufficiently Active (03/16/2023)   Exercise Vital Sign    Days of Exercise per Week: 5 days    Minutes of Exercise per Session: 50 min  Stress: No Stress Concern Present (03/16/2023)   [name] [name]    Feeling of Stress : Only a little  Social Connections: Moderately Integrated (05/01/2023)   Social Connection and Isolation Panel [NHANES]    Frequency of Communication with Friends and Family: More than three times a week    Frequency of Social Gatherings with Friends and Family: More than three times a week    Attends Religious Services: Never    Database administrator or Organizations: Yes    Attends Engineer, structural: More than 4 times per year    Marital Status: Married   Family History  Problem Relation Age of Onset   Hypothyroidism  Mother    Alzheimer's disease Mother    Diabetes Father    Cancer Sister        GYN cancer   Hypothyroidism Sister    Heart disease Brother    Arthritis Son    Diabetes Son    Arthritis Son    Cancer Maternal Grandmother    Heart failure Maternal Grandfather    Bone cancer Paternal Grandmother    Heart failure Paternal Grandmother    - negative except otherwise stated in the family history section Allergies  Allergen Reactions   Codeine Swelling   Influenza A (H1n1) Monoval Pf Anaphylaxis   Hydrocodone-Acetaminophen Other (See Comments)    unknown   Lisinopril Other (See Comments)    unknown   Metformin Other (  See Comments)    unknown   Pregabalin Other (See Comments)    unknown   Jardiance [Empagliflozin] Other (See Comments)    dehydration   Prior to Admission medications   Medication Sig Start Date End Date Taking? Authorizing Provider  acetaminophen (TYLENOL) 500 MG tablet Take 500 mg by mouth every 8 (eight) hours as needed for moderate pain (pain score 4-6). 10/18/19  Yes [provider]  albuterol (VENTOLIN HFA) 108 (90 Base) MCG/ACT inhaler Inhale 1-2 puffs into the lungs every 4 (four) hours as needed for wheezing or shortness of breath. 11/03/21  Yes Anabel Halon, MD  Ascorbic Acid (VITAMIN C PO) Take 1 tablet by mouth daily.   Yes [provider]  buPROPion (WELLBUTRIN XL) 150 MG 24 hr tablet TAKE 1 TABLET BY MOUTH EVERY DAY IN THE MORNING 05/03/22  Yes Patel, Earlie Lou, MD  carvedilol (COREG) 3.125 MG tablet TAKE 1 TABLET BY MOUTH TWICE A DAY WITH FOOD 04/18/23  Yes Carlan, Chelsea L, NP  Cyanocobalamin (VITAMIN B-12 PO) Take 1 tablet by mouth daily.   Yes [provider]  FARXIGA 10 MG TABS tablet TAKE 1 TABLET BY MOUTH DAILY BEFORE BREAKFAST. 12/07/21  Yes Shamleffer, Konrad Dolores, MD  fluticasone (FLONASE) 50 MCG/ACT nasal spray Place 1 spray into both nostrils daily as needed for allergies. 05/02/18  Yes [provider]   gabapentin (NEURONTIN) 600 MG tablet TAKE 1 TABLET IN THE MORNING AND 1 TABLET IN THE AFTERNOON AND 2TABS AT BEDTIME   Yes [provider]  insulin glargine (LANTUS SOLOSTAR) 100 UNIT/ML Solostar Pen Inject 62 Units into the skin daily. 06/23/22  Yes Shamleffer, Konrad Dolores, MD  ketoconazole (NIZORAL) 2 % cream Apply 1 Application topically daily. 11/03/21  Yes Anabel Halon, MD  levothyroxine (SYNTHROID) 150 MCG tablet Take 1 tablet (150 mcg total) by mouth daily. 12/14/21  Yes Shamleffer, Konrad Dolores, MD  losartan (COZAAR) 50 MG tablet Take 1 tablet (50 mg total) by mouth daily. 03/30/23  Yes Anabel Halon, MD  meclizine (ANTIVERT) 25 MG tablet Take 1 tablet (25 mg total) by mouth 3 (three) times daily as needed for dizziness. 03/30/23  Yes Anabel Halon, MD  methocarbamol (ROBAXIN) 500 MG tablet Take 1 tablet (500 mg total) by mouth every 8 (eight) hours as needed for muscle spasms. 09/24/21  Yes Kirtland Bouchard, PA-C  mirabegron ER (MYRBETRIQ) 25 MG TB24 tablet Take 1 tablet (25 mg total) by mouth daily. 08/24/22  Yes Donnita Falls, FNP  Multiple Vitamins-Minerals (MULTIVITAMIN WITH MINERALS) tablet Take 1 tablet by mouth daily.   Yes [provider]  mycophenolate (CELLCEPT) 500 MG tablet Take 1,500 mg by mouth 2 (two) times daily. 04/02/21  Yes [provider]  omeprazole (PRILOSEC) 20 MG capsule Take 1 capsule (20 mg total) by mouth daily. 10/20/22  Yes Dolores Frame, MD  promethazine-dextromethorphan (PROMETHAZINE-DM) 6.25-15 MG/5ML syrup Take 5 mLs by mouth 4 (four) times daily as needed for cough. 02/03/22  Yes Anabel Halon, MD  Pyridoxine HCl (VITAMIN B-6 PO) Take by mouth daily.   Yes [provider]  Semaglutide, 1 MG/DOSE, (OZEMPIC, 1 MG/DOSE,) 4 MG/3ML SOPN Inject 1 mg into the skin once a week. 04/26/23  Yes   traMADol (ULTRAM) 50 MG tablet Take 1 tablet (50 mg total) by mouth every 6 (six) hours as needed. Patient taking  differently: Take 50 mg by mouth every 6 (six) hours as needed for moderate pain (pain score  4-6). 04/18/23  Yes Kirtland Bouchard, PA-C  VITAMIN D PO Take 1 tablet by mouth daily.   Yes [provider]  ACCU-CHEK AVIVA PLUS test strip USE AS INSTRUCTED TO CHECK BLOOD SUGAR 2 TIMES DAILY 08/13/21   Shamleffer, Konrad Dolores, MD  exemestane (AROMASIN) 25 MG tablet TAKE 1 TABLET (25 MG TOTAL) BY MOUTH DAILY AFTER BREAKFAST. 12/27/22   Rachel Moulds, MD  Insulin Pen Needle (B-D UF III MINI PEN NEEDLES) 31G X 5 MM MISC USE AS DIRECTED WITH INSULIN EVERY MORNING, NOON, EVENING AND AT BEDTIME 12/14/21   Shamleffer, Konrad Dolores, MD  Insulin Pen Needle 30G X 5 MM MISC 1 Device by Does not apply route in the morning, at noon, in the evening, and at bedtime. 12/14/21   Shamleffer, Konrad Dolores, MD  UNABLE TO FIND 1 each by Does not apply route daily. Med Name: Iantha Fallen  DX ONGE-X52.84 04/22/22   Anabel Halon, MD   DG Knee Complete 4 Views Right Result Date: 05/01/2023 CLINICAL DATA:  fall EXAM: RIGHT KNEE - COMPLETE 4+ VIEW COMPARISON:  X-ray right knee 04/07/2023 FINDINGS: Total right knee arthroplasty. Acute periprosthetic half shaft width posteriorly displaced and comminuted fracture of the distal femoral metadiaphysis. Acute minimally displaced fibular neck fracture. No dislocation no evidence of arthropathy or other focal bone abnormality. Soft tissues are unremarkable. IMPRESSION: 1. Total right knee arthroplasty with acute periprosthetic half shaft width posteriorly displaced and comminuted fracture of the distal femoral metadiaphysis. 2. Acute minimally displaced fibular neck fracture. Electronically Signed   By: Tish Frederickson M.D.   On: 05/01/2023 14:00     Positive ROS: All other systems have been reviewed and were otherwise negative with the exception of those mentioned in the HPI and as above.  Physical Exam: General: No acute distress Cardiovascular: No pedal  edema Respiratory: No cyanosis, no use of accessory musculature GI: No organomegaly, abdomen is soft and non-tender Skin: No lesions in the area of chief complaint Neurologic: Sensation intact distally Psychiatric: Patient is at baseline mood and affect Lymphatic: No axillary or cervical lymphadenopathy  MUSCULOSKELETAL:  Right leg without any type of open wounds.  There is obvious deformity about the knee.  Fires tibialis anterior as well as gastrocsoleus.  Toes are warm and well-perfused  Independent Imaging Review: 3 views right knee: Periprosthetic distal femur fracture around a well placed total knee arthroplasty  Assessment: 73 year old female with a right periprosthetic distal femur fracture after a fall while getting into her truck.  At this time I do believe that there is adequate bone stock in the distal femoral component to achieve adequate fixation with distal femoral plating.  I discussed the risks and limitations of this to her and her family.  I did discuss the associated recovery timeframe.  After discussion they have elected for this  Plan: For right distal femur open reduction internal fixation   After a lengthy discussion of treatment options, including risks, benefits, alternatives, complications of surgical and nonsurgical conservative options, the patient elected surgical repair.   The patient  is aware of the material risks  and complications including, but not limited to injury to adjacent structures, neurovascular injury, infection, numbness, bleeding, implant failure, thermal burns, stiffness, persistent pain, failure to heal, disease transmission from allograft, need for further surgery, dislocation, anesthetic risks, blood clots, risks of death,and others. The probabilities of surgical success and failure discussed with patient given their particular co-morbidities.The time and nature of expected rehabilitation and recovery was discussed.The  patient's questions  were all answered preoperatively.  No barriers to understanding were noted. I explained the natural history of the disease process and Rx rationale.  I explained to the patient what I considered to be reasonable expectations given their personal situation.  The final treatment plan was arrived at through a shared patient decision making process model.   Thank you for the consult and the opportunity to see [name] [name]  Huel Cote, MD [facility] 7:54 AM

## 2023-05-02 NOTE — Progress Notes (Signed)
 PROGRESS NOTE  Avaiah Stempel Meanor ZOX:[id number] DOB: 1950-12-15 DOA: 05/01/2023 PCP: Anabel Halon, MD   HPI/Recap of past 24 hours: Maceo Pro Bridge is a 73 y.o. female with medical history significant of ambulatory dysfunction, left foot droop, obesity, history of breast cancer, T2DM, and ambulatory dysfunction who presented after a fall, landing on her R knee while trying to get into her family's truck PTA.  Noted severe pain on right lower extremity.  EMS was activated, splint was placed on her right leg and sent to the ED.  Of note, patient uses a wheelchair for mobility for several years due to back pain and left foot droop. She is able to make a few steps using a walker inside her home, but most of the time needs a wheelchair, recently diagnosed with vertigo.  In the ED, vital signs fairly stable except for uncontrolled BP.  Labs fairly stable except for chronic thrombocytopenia.  X-ray right knee showed total right knee arthroplasty with acute periprosthetic half shaft width posterior displaced and commuted fracture of the distal femoral metadiaphysis, acute minimally displaced fibular neck fracture.  Orthopedics consulted.  Patient admitted for further management.    Today, patient reporting right leg pain, denies any chest pain, shortness of breath, abdominal pain, nausea/vomiting, fever/chills.  Daughter and husband at bedside.   Assessment/Plan: Principal Problem:   Right femoral fracture (HCC) Active Problems:   Essential hypertension   Type 2 diabetes mellitus with hyperlipidemia (HCC)   Thrombocytopenia (HCC)   Class 3 obesity   Malignant neoplasm of upper-outer quadrant of left breast in female, estrogen receptor positive (HCC)   Vertigo   Cirrhosis of liver without ascites (HCC)   Hypothyroidism   Right periprosthetic distal femur fracture X-ray right knee showed total right knee arthroplasty with acute periprosthetic half shaft width posterior displaced and commuted fracture  of the distal femoral metadiaphysis, acute minimally displaced fibular neck fracture Orthopedics on board, plan for surgery on 05/02/2023 Pain management  AKI Creatinine noted to be 1.72 from baseline of normal Bladder scan, In-N-Out cath as needed Start IV fluid hydration Daily BMP   Essential hypertension BP soft Hold home losartan and carvedilol   Type 2 diabetes mellitus with hyperlipidemia  A1c 6.5 SSI, Accu-Cheks, hypoglycemic protocol Low dose basal insulin to start post surgery  Diabetic neuropathy, continue with gabapentin    Anemia/thrombocytopenia/2 chronic disease Likely 2/2 history of malignancy, on Aromasin Daily CBC, monitor closely  Malignant neoplasm of upper-outer quadrant of left breast in female, estrogen receptor positive (HCC) Continue with aromasin  Outpatient oncology follow-up  Vertigo Follow up with PT and OT post surgical intervention    Cirrhosis of liver without ascites (HCC) Likely NASH, no signs of acute decompensation  Follow up as outpatient   Hypothyroidism Continue with levothyroxine    Class 3 obesity Calculate BMI           Estimated body mass index is 47.61 kg/m as calculated from the following:   Height as of 03/30/23: 5\' 1"  (1.549 m).   Weight as of 04/12/23: 114.3 kg.     Code Status: Full  Family Communication: Discussed with husband and daughter at bedside  Disposition Plan: Status is: Inpatient Remains inpatient appropriate because: Level of care      Consultants: Orthopedics  Procedures: None  Antimicrobials: None  DVT prophylaxis: Lovenox    Objective: Vitals:   05/01/23 2025 05/02/23 0046 05/02/23 0504 05/02/23 0747  BP: (!) 113/59 (!) 126/56 (!) 102/55 99/60  Pulse:  93 92 85 90  Resp: 17  16   Temp: 97.7 F (36.5 C) 98.4 F (36.9 C) 98.4 F (36.9 C) 97.8 F (36.6 C)  TempSrc: Oral Oral Oral Oral  SpO2: 96% 95% 95% 95%   No intake or output data in the 24 hours ending 05/02/23  1338 There were no vitals filed for this visit.  Exam: General: NAD  Cardiovascular: S1, S2 present Respiratory: Diminished breath sounds bilaterally Abdomen: Soft, nontender, nondistended, bowel sounds present Musculoskeletal: No bilateral pedal edema noted Skin: Normal Psychiatry: Normal mood     Data Reviewed: CBC: Recent Labs  Lab 05/01/23 1336 05/01/23 1611 05/02/23 0740  WBC 3.2* 4.6 6.9  NEUTROABS 2.2  --   --   HGB 11.8* 11.1* 10.9*  HCT 36.8 33.8* 35.2*  MCV 87.8 86.7 89.3  PLT 68* 73* 126*   Basic Metabolic Panel: Recent Labs  Lab 05/01/23 1336 05/01/23 1611 05/02/23 0740  NA 133*  --  135  K 3.9  --  4.7  CL 103  --  102  CO2 21*  --  24  GLUCOSE 178*  --  172*  BUN 17  --  24*  CREATININE 0.76 0.80 1.72*  CALCIUM 9.3  --  9.6   GFR: CrCl cannot be calculated (Unknown ideal weight.). Liver Function Tests: No results for input(s): "AST", "ALT", "ALKPHOS", "BILITOT", "PROT", "ALBUMIN" in the last 168 hours. No results for input(s): "LIPASE", "AMYLASE" in the last 168 hours. No results for input(s): "AMMONIA" in the last 168 hours. Coagulation Profile: No results for input(s): "INR", "PROTIME" in the last 168 hours. Cardiac Enzymes: No results for input(s): "CKTOTAL", "CKMB", "CKMBINDEX", "TROPONINI" in the last 168 hours. BNP (last 3 results) No results for input(s): "PROBNP" in the last 8760 hours. HbA1C: Recent Labs    05/01/23 1709  HGBA1C 6.5*   CBG: Recent Labs  Lab 05/01/23 1708 05/01/23 2112 05/02/23 0611 05/02/23 1146  GLUCAP 144* 155* 161* 146*   Lipid Profile: No results for input(s): "CHOL", "HDL", "LDLCALC", "TRIG", "CHOLHDL", "LDLDIRECT" in the last 72 hours. Thyroid Function Tests: No results for input(s): "TSH", "T4TOTAL", "FREET4", "T3FREE", "THYROIDAB" in the last 72 hours. Anemia Panel: No results for input(s): "VITAMINB12", "FOLATE", "FERRITIN", "TIBC", "IRON", "RETICCTPCT" in the last 72 hours. Urine analysis:     Component Value Date/Time   COLORURINE YELLOW 08/30/2018 1357   APPEARANCEUR Clear 11/17/2022 0839   LABSPEC 1.024 08/30/2018 1357   PHURINE 5.0 08/30/2018 1357   GLUCOSEU 3+ (A) 11/17/2022 0839   HGBUR NEGATIVE 08/30/2018 1357   BILIRUBINUR Negative 11/17/2022 0839   KETONESUR NEGATIVE 08/30/2018 1357   PROTEINUR 2+ (A) 11/17/2022 0839   PROTEINUR NEGATIVE 08/30/2018 1357   UROBILINOGEN 0.2 12/09/2011 1907   NITRITE Negative 11/17/2022 0839   NITRITE NEGATIVE 08/30/2018 1357   LEUKOCYTESUR Trace (A) 11/17/2022 0839   LEUKOCYTESUR NEGATIVE 08/30/2018 1357   Sepsis Labs: @LABRCNTIP (procalcitonin:4,lacticidven:4)  ) Recent Results (from the past 240 hours)  Surgical pcr screen     Status: Abnormal   Collection Time: 05/02/23  5:21 AM   Specimen: Nasal Mucosa; Nasal Swab  Result Value Ref Range Status   MRSA, PCR NEGATIVE NEGATIVE Final   Staphylococcus aureus POSITIVE (A) NEGATIVE Final    Comment: (NOTE) The Xpert SA Assay (FDA approved for NASAL specimens in patients 41 years of age and older), is one component of a comprehensive surveillance program. It is not intended to diagnose infection nor to guide or monitor treatment. Performed at [name], [address] [place], [place] [address]       Studies: DG Knee Complete 4 Views Right Result Date: 05/01/2023 CLINICAL DATA:  fall EXAM: RIGHT KNEE - COMPLETE 4+ VIEW COMPARISON:  X-ray right knee 04/07/2023 FINDINGS: Total right knee arthroplasty. Acute periprosthetic half shaft width posteriorly displaced and comminuted fracture of the distal femoral metadiaphysis. Acute minimally displaced fibular neck fracture. No dislocation no evidence of arthropathy or other focal bone abnormality. Soft tissues are unremarkable. IMPRESSION: 1. Total right knee arthroplasty with acute periprosthetic half shaft width posteriorly displaced and comminuted fracture of the distal femoral metadiaphysis. 2. Acute minimally displaced  fibular neck fracture. Electronically Signed   By: Tish Frederickson M.D.   On: 05/01/2023 14:00    Scheduled Meds:  buPROPion  150 mg Oral Daily   carvedilol  3.125 mg Oral BID WC   Chlorhexidine Gluconate Cloth  6 each Topical Daily   enoxaparin (LOVENOX) injection  40 mg Subcutaneous Q24H   exemestane  25 mg Oral QPC breakfast   insulin aspart  0-15 Units Subcutaneous TID WC   levothyroxine  150 mcg Oral Q0600   mirabegron ER  25 mg Oral Daily   mupirocin ointment  1 Application Nasal BID   pantoprazole  40 mg Oral Daily    Continuous Infusions:   LOS: 1 day     Briant Cedar, MD [facility]  If 7PM-7AM, please contact night-coverage [email or web address] 05/02/2023, 1:38 PM

## 2023-05-02 NOTE — Progress Notes (Signed)
 Patient taken to short stay for surgery; taken there via bed by transport. Patient has had another CHG bath; oral care completed. CBG will have to be taken there in short stay as machine not recognizing RN as Designer, television/film set. Patient's family updated and are heading to the main waiting area.

## 2023-05-02 NOTE — TOC Initial Note (Signed)
 Transition of Care [facility]) - Initial/Assessment Note    Patient Details  Name: [name] [name] MRN: [id number] Date of Birth: 13-Apr-1950  Transition of Care [facility]) CM/SW Contact:    Ronny Bacon, RN Phone Number: 05/02/2023, 11:08 AM  Clinical Narrative:   Patient from home with spouse. Patient has right femur fracture and is scheduled for OR today. Patient has all needed DME at home and is currently receiving [facility] PT and RN services through [facility]. Confirmed with Artavia.                Expected Discharge Plan: Home w Home Health Services Barriers to Discharge: Continued Medical Work up (OR 05/02/23 @ 1631)   Patient Goals and CMS Choice            Expected Discharge Plan and Services       Living arrangements for the past 2 months: Single Family Home                                      Prior Living Arrangements/Services Living arrangements for the past 2 months: Single Family Home Lives with:: Spouse Patient language and need for interpreter reviewed:: Yes Do you feel safe going back to the place where you live?: Yes      Need for Family Participation in Patient Care: Yes (Comment) Care giver support system in place?: Yes (comment) Current home services: DME, Home PT, Home RN [facility] services with [facility], confirmed with Adele Dan) Criminal Activity/Legal Involvement Pertinent to Current Situation/Hospitalization: No - Comment as needed  Activities of Daily Living   ADL Screening (condition at time of admission) Independently performs ADLs?: Yes (appropriate for developmental age) Is the patient deaf or have difficulty hearing?: No Does the patient have difficulty seeing, even when wearing glasses/contacts?: No Does the patient have difficulty concentrating, remembering, or making decisions?: No  Permission Sought/Granted                  Emotional Assessment Appearance:: Appears stated age Attitude/Demeanor/Rapport: Engaged Affect (typically  observed): Appropriate Orientation: : Oriented to Self, Oriented to Place, Oriented to  Time, Oriented to Situation Alcohol / Substance Use: Not Applicable Psych Involvement: No (comment)  Admission diagnosis:  Closed left femoral fracture (HCC) [S72.92XA] Other closed fracture of right femur, unspecified portion of femur, initial encounter (HCC) [W09.8J1B] Patient Active Problem List   Diagnosis Date Noted   Closed left femoral fracture (HCC) 05/01/2023   Right femoral fracture (HCC) 05/01/2023   Hypothyroidism 05/01/2023   Physical deconditioning 03/30/2023   Fall 11/25/2022   COVID-19 virus infection 09/17/2022   Immunosuppressed status (HCC) 09/17/2022   Abnormal CT of the abdomen 08/09/2022   Paraumbilical hernia 07/29/2022   Esophageal varices in cirrhosis (HCC) 07/29/2022   Cirrhosis of liver without ascites (HCC) 07/12/2022   LUQ abdominal pain 07/05/2022   Drug-induced constipation 07/05/2022   Anxiety disorder 08/28/2021   Disorder of intervertebral disc of cervical spine 08/28/2021   Family history of malignant neoplasm of digestive organs 08/28/2021   Malignant neoplasm of right breast (HCC) 07/08/2021   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   Pseudophakia of both eyes 04/01/2021   Retinal edema 04/01/2021   Trichiasis of eyelid of both eyes 04/01/2021   Hypertrophy of inferior nasal turbinate 02/26/2021   Allergic rhinitis due to pollen 01/29/2021   Allergy to influenza vaccine 01/29/2021   Gout 01/29/2021   Hyperlipidemia 01/29/2021  Nonalcoholic steatohepatitis (NASH) 01/29/2021   Primary insomnia 01/29/2021   Recurrent major depression in remission (HCC) 01/29/2021   Arthritis of right ankle 04/14/2020   Nasal septal deviation 03/11/2020   Arthritis of right subtalar joint 10/09/2019   Acquired pes planovalgus of right foot 10/09/2019   Acquired posterior equinus of right lower extremity 10/09/2019   Epistaxis, recurrent 09/26/2019   OSA on CPAP  09/26/2019   Acquired hypothyroidism 05/21/2019   Arthritis of shoulder 09/07/2018   Bilateral hearing loss 08/01/2018   Vertigo 08/01/2018   Chronic nonintractable headache 08/01/2018   Type 2 diabetes mellitus with hyperlipidemia (HCC) 11/16/2016   Bilateral lower extremity edema 09/01/2016   Diabetic polyneuropathy associated with type 2 diabetes mellitus (HCC) 06/03/2016   Malignant neoplasm of upper-outer quadrant of left breast in female, estrogen receptor positive (HCC) 08/19/2015   Osteoarthritis of right knee 02/28/2015   Status post total right knee replacement 02/28/2015   Splenomegaly 07/30/2014   Thrombocytopenia (HCC) 07/30/2014   Recurrent falls 01/12/2013   Class 3 obesity 01/12/2013   Degenerative arthritis of spine 01/12/2013   Essential hypertension 09/05/2012   PCP:  Anabel Halon, MD Pharmacy:   [id number] [place], [place] - [address] [place] [address] Phone: [phone] Fax: [phone]  [facility] (New Address) - [place], [place] - [address] AT Previously: [place], [place] [address] [place] [address] Phone: [phone] Fax: [phone]  [facility] - [place], [place] - [address] AT Portal to Registered [facility] [address] [place] [address] Phone: [phone] Fax: [phone]  [facility] - [place], [place] - [address] [place] [address] Phone: [phone] Fax: [phone]     Social Drivers of Health (SDOH) Social History: SDOH Screenings   Food Insecurity: No Food Insecurity (05/01/2023)  Housing: Low Risk  (05/01/2023)  Transportation Needs: No Transportation Needs (05/01/2023)  Utilities: Not At Risk (05/01/2023)  Alcohol Screen: Low Risk  (03/16/2023)  Depression (PHQ2-9):  Low Risk  (03/16/2023)  Recent Concern: Depression (PHQ2-9) - Medium Risk (12/29/2022)  Financial Resource Strain: Low Risk  (03/16/2023)  Physical Activity: Sufficiently Active (03/16/2023)  Social Connections: Moderately Integrated (05/01/2023)  Stress: No Stress Concern Present (03/16/2023)  Tobacco Use: Low Risk  (05/01/2023)  Health Literacy: Adequate Health Literacy (03/16/2023)   SDOH Interventions:     Readmission Risk Interventions     No data to display

## 2023-05-02 NOTE — Transfer of Care (Signed)
 Immediate Anesthesia Transfer of Care Note  Patient: Adisa A Markey  Procedure(s) Performed: OPEN REDUCTION INTERNAL FIXATION (ORIF) DISTAL FEMUR FRACTURE (Right: Thigh)  Patient Location: PACU  Anesthesia Type:General  Level of Consciousness: awake and alert   Airway & Oxygen Therapy: Patient Spontanous Breathing and Patient connected to nasal cannula oxygen  Post-op Assessment: Report given to RN and Post -op Vital signs reviewed and stable  Post vital signs: Reviewed and stable  Last Vitals:  Vitals Value Taken Time  BP 112/45 05/02/23 1915  Temp 36.7 C 05/02/23 1910  Pulse 82 05/02/23 1915  Resp 14 05/02/23 1915  SpO2 93 % 05/02/23 1915  Vitals shown include unfiled device data.  Last Pain:  Vitals:   05/02/23 1910  TempSrc:   PainSc: 3       Patients Stated Pain Goal: 0 (05/02/23 1440)  Complications: No notable events documented.

## 2023-05-02 NOTE — Brief Op Note (Signed)
   Brief Op Note  Date of Surgery: 05/02/2023  Preoperative Diagnosis: Right Femur Fx  Postoperative Diagnosis: same  Procedure: Procedure(s): OPEN REDUCTION INTERNAL FIXATION (ORIF) DISTAL FEMUR FRACTURE  Implants: Implant Name Type Inv. Item Serial No. Manufacturer Lot No. LRB No. Used Action  CAP LOCK NCB - [id number] Cap CAP LOCK NCB  ZIMMER RECON(ORTH,TRAU,BIO,SG)  Right 5 Implanted  SCREW 5.0 - [id number] Screw SCREW 5.0  ZIMMER [employer]ON(ORTH,TRAU,BIO,SG)  Right 1 Implanted  SCREW CORTICAL NCB 5.0X44 - [id number] Screw SCREW CORTICAL NCB 5.0X44  ZIMMER RECON(ORTH,TRAU,BIO,SG)  Right 1 Implanted  SCREW NCB 5.7Q46N6E9.2XST - [id number] Screw SCREW NCB [id number]  ZIMMER RECON(ORTH,TRAU,BIO,SG)  Right 2 Implanted  SCREW 5.0 - [id number] Screw SCREW 5.0  ZIMMER RECON(ORTH,TRAU,BIO,SG)  Right 2 Implanted  SCREW NCB 4.0MX34M - [id number] Screw SCREW NCB 4.0MX34M  ZIMMER RECON(ORTH,TRAU,BIO,SG)  Right 2 Implanted  SCREW NCB 4.4IH47Q - [id number] Screw SCREW NCB 4.4PP29J  ZIMMER RECON(ORTH,TRAU,BIO,SG)  Right 1 Implanted  PLATE FEMUR DISTAL NCB 5H - [id number] Plate PLATE FEMUR DISTAL NCB 5H  ZIMMER RECON(ORTH,TRAU,BIO,SG)  Right 1 Implanted    Surgeons: Surgeon(s): Huel Cote, MD  Anesthesia: General    Estimated Blood Loss: See anesthesia record  Complications: None  Condition to PACU: Stable  Benancio Deeds, MD 05/02/2023 6:45 PM

## 2023-05-02 NOTE — Plan of Care (Signed)
  Problem: Clinical Measurements: Goal: Respiratory complications will improve Outcome: Progressing   Problem: Coping: Goal: Level of anxiety will decrease Outcome: Progressing   Problem: Pain Managment: Goal: General experience of comfort will improve and/or be controlled Outcome: Progressing   Problem: Safety: Goal: Ability to remain free from injury will improve Outcome: Progressing

## 2023-05-02 NOTE — H&P (View-Only) (Signed)
 ORTHOPAEDIC CONSULTATION  REQUESTING PHYSICIAN: Briant Cedar, MD  Chief Complaint: Fall, right leg injury  HPI: [name] [name] is a 73 y.o. female who presents with a right periprosthetic distal femur fracture after previous total knee arthroplasty by Dr. Magnus Ivan in 2017.  She has been having pain in the knee and unfortunately did have a fall while getting into her truck.  She proceeded to the emergency room and was placed in a knee immobilizer.  She is here today for operative intervention  Past Medical History:  Diagnosis Date   Abdominal wall contusion 12/09/2011   Achilles tendon contracture, bilateral 06/03/2016   Anxiety    Arthritis    Blood dyscrasia    low platelet count- sees [name] , Dr. Nelva Nay ( Note in EPIC from 07/2014) at [facility]   Breast cancer [facility]) 2017   Left Breast   Breast cancer (HCC) 07/2021   right breast IDC   Cancer (HCC)    left breast   Chest wall contusion 12/09/2011   Cirrhosis of liver (HCC)    Cough    Diabetes mellitus without complication (HCC)    Diabetic neuropathy (HCC)    Diarrhea due to drug 01/29/2021   Dyspnea    Gout    Gout    History of radiation therapy 10/22/15 - 12/08/15   left breast 50.4 Gy, boost to 10 Gy   History of radiation therapy    Right breast- 09/22/21-10/30/21- Dr. 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   Neuromuscular disorder (HCC)    diabetic neuropathy   Neuropathy    Personal history of radiation therapy 2017   Left Breast Cancer   Posterior tibial tendon dysfunction (PTTD) of right lower extremity 10/09/2019   Sleep apnea    CPAP nightly   Spleen enlarged    Thyroid disease    Past Surgical History:  Procedure Laterality Date   ANKLE FUSION Right 2022   BIOPSY  10/19/2022   Procedure: BIOPSY;  Surgeon: Dolores Frame, MD;  [facility]: [facility];  Service: Gastroenterology;;   BREAST BIOPSY  Left 08/04/2015   Procedure: LEFT BREAST BIOPSY WITH NEEDLE LOCALIZATION;  Surgeon: Darnell Level, MD;  Location: [facility];  Service: General;  Laterality: Left;   BREAST BIOPSY Right 06/23/2021   BREAST DUCTAL SYSTEM EXCISION Left 08/04/2015   Procedure: LEFT EXCISION DUCTAL SYSTEM BREAST;  Surgeon: Darnell Level, MD;  Location: [facility];  Service: General;  Laterality: Left;   BREAST LUMPECTOMY Left 08/2015   BREAST LUMPECTOMY Right 08/2021   BREAST LUMPECTOMY WITH AXILLARY LYMPH NODE BIOPSY Left 09/12/2015   Procedure: RE-EXCISION OF LEFT BREAST LUMPECTOMY WITH LEFT AXILLARY LYMPH NODE BIOPSY;  Surgeon: Darnell Level, MD;  Location: [facility];  Service: General;  Laterality: Left;   BREAST LUMPECTOMY WITH RADIOACTIVE SEED LOCALIZATION Right 08/12/2021   Procedure: RIGHT BREAST LUMPECTOMY WITH RADIOACTIVE SEED LOCALIZATION;  Surgeon: Abigail Miyamoto, MD;  Location: [facility];  Service: General;  Laterality: Right;  LMA   CHOLECYSTECTOMY     ESOPHAGOGASTRODUODENOSCOPY (EGD) WITH PROPOFOL N/A 10/19/2022   Procedure: ESOPHAGOGASTRODUODENOSCOPY (EGD) WITH PROPOFOL;  Surgeon: Dolores Frame, MD;  [facility];  Service: Gastroenterology;  Laterality: N/A;  10:30am asa 3   EYE SURGERY Bilateral 2022   Lashes growing backwards into the eye   JOINT REPLACEMENT     left knee  JOINT REPLACEMENT  2017   right knee   REVERSE SHOULDER ARTHROPLASTY Right 09/07/2018   Procedure: RIGHT REVERSE SHOULDER ARTHROPLASTY;  Surgeon: Cammy Copa, MD;  [facility];  Service: Orthopedics;  Laterality: Right;   TOTAL KNEE ARTHROPLASTY Right 02/28/2015   Procedure: RIGHT TOTAL KNEE ARTHROPLASTY;  Surgeon: Kathryne Hitch, MD;  Location: [facility];  Service: Orthopedics;  Laterality: Right;   TUBAL LIGATION     Social History   Socioeconomic History   Marital status: Married    Spouse name: [name] [name]   Number of  children: 3   Years of education: 12th   Highest education level: 12th grade  Occupational History   Occupation: Retired    Comment: Dance movement psychotherapist  Tobacco Use   Smoking status: Never    Passive exposure: Never   Smokeless tobacco: Never  Vaping Use   Vaping status: Never Used  Substance and Sexual Activity   Alcohol use: No   Drug use: No   Sexual activity: Yes    Partners: Male    Birth control/protection: Surgical    Comment: BTL  Other Topics Concern   Not on file  Social History Narrative   Not on file   Social Drivers of Health   Financial Resource Strain: Low Risk  (03/16/2023)   Overall Financial Resource Strain (CARDIA)    Difficulty of Paying Living Expenses: Not hard at all  Food Insecurity: No Food Insecurity (05/01/2023)   Hunger Vital Sign    Worried About Running Out of Food in the Last Year: Never true    Ran Out of Food in the Last Year: Never true  Transportation Needs: No Transportation Needs (05/01/2023)   PRAPARE - Administrator, Civil Service (Medical): No    Lack of Transportation (Non-Medical): No  Physical Activity: Sufficiently Active (03/16/2023)   Exercise Vital Sign    Days of Exercise per Week: 5 days    Minutes of Exercise per Session: 50 min  Stress: No Stress Concern Present (03/16/2023)   [employer] of Occupational Health - Occupational Stress Questionnaire    Feeling of Stress : Only a little  Social Connections: Moderately Integrated (05/01/2023)   Social Connection and Isolation Panel [NHANES]    Frequency of Communication with Friends and Family: More than three times a week    Frequency of Social Gatherings with Friends and Family: More than three times a week    Attends Religious Services: Never    Database administrator or Organizations: Yes    Attends Engineer, structural: More than 4 times per year    Marital Status: Married   Family History  Problem Relation Age of Onset   Hypothyroidism  Mother    Alzheimer's disease Mother    Diabetes Father    Cancer Sister        GYN cancer   Hypothyroidism Sister    Heart disease Brother    Arthritis Son    Diabetes Son    Arthritis Son    Cancer Maternal Grandmother    Heart failure Maternal Grandfather    Bone cancer Paternal Grandmother    Heart failure Paternal Grandmother    - negative except otherwise stated in the family history section Allergies  Allergen Reactions   Codeine Swelling   Influenza A (H1n1) Monoval Pf Anaphylaxis   Hydrocodone-Acetaminophen Other (See Comments)    unknown   Lisinopril Other (See Comments)    unknown   Metformin Other (  See Comments)    unknown   Pregabalin Other (See Comments)    unknown   Jardiance [Empagliflozin] Other (See Comments)    dehydration   Prior to Admission medications   Medication Sig Start Date End Date Taking? Authorizing Provider  acetaminophen (TYLENOL) 500 MG tablet Take 500 mg by mouth every 8 (eight) hours as needed for moderate pain (pain score 4-6). 10/18/19  Yes [provider]  albuterol (VENTOLIN HFA) 108 (90 Base) MCG/ACT inhaler Inhale 1-2 puffs into the lungs every 4 (four) hours as needed for wheezing or shortness of breath. 11/03/21  Yes Anabel Halon, MD  Ascorbic Acid (VITAMIN C PO) Take 1 tablet by mouth daily.   Yes [provider]  buPROPion (WELLBUTRIN XL) 150 MG 24 hr tablet TAKE 1 TABLET BY MOUTH EVERY DAY IN THE MORNING 05/03/22  Yes Patel, Earlie Lou, MD  carvedilol (COREG) 3.125 MG tablet TAKE 1 TABLET BY MOUTH TWICE A DAY WITH FOOD 04/18/23  Yes Carlan, Chelsea L, NP  Cyanocobalamin (VITAMIN B-12 PO) Take 1 tablet by mouth daily.   Yes [provider]  FARXIGA 10 MG TABS tablet TAKE 1 TABLET BY MOUTH DAILY BEFORE BREAKFAST. 12/07/21  Yes Shamleffer, Konrad Dolores, MD  fluticasone (FLONASE) 50 MCG/ACT nasal spray Place 1 spray into both nostrils daily as needed for allergies. 05/02/18  Yes [provider]   gabapentin (NEURONTIN) 600 MG tablet TAKE 1 TABLET IN THE MORNING AND 1 TABLET IN THE AFTERNOON AND 2TABS AT BEDTIME   Yes [provider]  insulin glargine (LANTUS SOLOSTAR) 100 UNIT/ML Solostar Pen Inject 62 Units into the skin daily. 06/23/22  Yes Shamleffer, Konrad Dolores, MD  ketoconazole (NIZORAL) 2 % cream Apply 1 Application topically daily. 11/03/21  Yes Anabel Halon, MD  levothyroxine (SYNTHROID) 150 MCG tablet Take 1 tablet (150 mcg total) by mouth daily. 12/14/21  Yes Shamleffer, Konrad Dolores, MD  losartan (COZAAR) 50 MG tablet Take 1 tablet (50 mg total) by mouth daily. 03/30/23  Yes Anabel Halon, MD  meclizine (ANTIVERT) 25 MG tablet Take 1 tablet (25 mg total) by mouth 3 (three) times daily as needed for dizziness. 03/30/23  Yes Anabel Halon, MD  methocarbamol (ROBAXIN) 500 MG tablet Take 1 tablet (500 mg total) by mouth every 8 (eight) hours as needed for muscle spasms. 09/24/21  Yes Kirtland Bouchard, PA-C  mirabegron ER (MYRBETRIQ) 25 MG TB24 tablet Take 1 tablet (25 mg total) by mouth daily. 08/24/22  Yes Donnita Falls, FNP  Multiple Vitamins-Minerals (MULTIVITAMIN WITH MINERALS) tablet Take 1 tablet by mouth daily.   Yes [provider]  mycophenolate (CELLCEPT) 500 MG tablet Take 1,500 mg by mouth 2 (two) times daily. 04/02/21  Yes [provider]  omeprazole (PRILOSEC) 20 MG capsule Take 1 capsule (20 mg total) by mouth daily. 10/20/22  Yes Dolores Frame, MD  promethazine-dextromethorphan (PROMETHAZINE-DM) 6.25-15 MG/5ML syrup Take 5 mLs by mouth 4 (four) times daily as needed for cough. 02/03/22  Yes Anabel Halon, MD  Pyridoxine HCl (VITAMIN B-6 PO) Take by mouth daily.   Yes [provider]  Semaglutide, 1 MG/DOSE, (OZEMPIC, 1 MG/DOSE,) 4 MG/3ML SOPN Inject 1 mg into the skin once a week. 04/26/23  Yes   traMADol (ULTRAM) 50 MG tablet Take 1 tablet (50 mg total) by mouth every 6 (six) hours as needed. Patient taking  differently: Take 50 mg by mouth every 6 (six) hours as needed for moderate pain (pain score  4-6). 04/18/23  Yes Kirtland Bouchard, PA-C  VITAMIN D PO Take 1 tablet by mouth daily.   Yes [provider]  ACCU-CHEK AVIVA PLUS test strip USE AS INSTRUCTED TO CHECK BLOOD SUGAR 2 TIMES DAILY 08/13/21   Shamleffer, Konrad Dolores, MD  exemestane (AROMASIN) 25 MG tablet TAKE 1 TABLET (25 MG TOTAL) BY MOUTH DAILY AFTER BREAKFAST. 12/27/22   Rachel Moulds, MD  Insulin Pen Needle (B-D UF III MINI PEN NEEDLES) 31G X 5 MM MISC USE AS DIRECTED WITH INSULIN EVERY MORNING, NOON, EVENING AND AT BEDTIME 12/14/21   Shamleffer, Konrad Dolores, MD  Insulin Pen Needle 30G X 5 MM MISC 1 Device by Does not apply route in the morning, at noon, in the evening, and at bedtime. 12/14/21   Shamleffer, Konrad Dolores, MD  UNABLE TO FIND 1 each by Does not apply route daily. Med Name: Iantha Fallen  DX ONGE-X52.84 04/22/22   Anabel Halon, MD   DG Knee Complete 4 Views Right Result Date: 05/01/2023 CLINICAL DATA:  fall EXAM: RIGHT KNEE - COMPLETE 4+ VIEW COMPARISON:  X-ray right knee 04/07/2023 FINDINGS: Total right knee arthroplasty. Acute periprosthetic half shaft width posteriorly displaced and comminuted fracture of the distal femoral metadiaphysis. Acute minimally displaced fibular neck fracture. No dislocation no evidence of arthropathy or other focal bone abnormality. Soft tissues are unremarkable. IMPRESSION: 1. Total right knee arthroplasty with acute periprosthetic half shaft width posteriorly displaced and comminuted fracture of the distal femoral metadiaphysis. 2. Acute minimally displaced fibular neck fracture. Electronically Signed   By: Tish Frederickson M.D.   On: 05/01/2023 14:00     Positive ROS: All other systems have been reviewed and were otherwise negative with the exception of those mentioned in the HPI and as above.  Physical Exam: General: No acute distress Cardiovascular: No pedal  edema Respiratory: No cyanosis, no use of accessory musculature GI: No organomegaly, abdomen is soft and non-tender Skin: No lesions in the area of chief complaint Neurologic: Sensation intact distally Psychiatric: Patient is at baseline mood and affect Lymphatic: No axillary or cervical lymphadenopathy  MUSCULOSKELETAL:  Right leg without any type of open wounds.  There is obvious deformity about the knee.  Fires tibialis anterior as well as gastrocsoleus.  Toes are warm and well-perfused  Independent Imaging Review: 3 views right knee: Periprosthetic distal femur fracture around a well placed total knee arthroplasty  Assessment: 73 year old female with a right periprosthetic distal femur fracture after a fall while getting into her truck.  At this time I do believe that there is adequate bone stock in the distal femoral component to achieve adequate fixation with distal femoral plating.  I discussed the risks and limitations of this to her and her family.  I did discuss the associated recovery timeframe.  After discussion they have elected for this  Plan: For right distal femur open reduction internal fixation   After a lengthy discussion of treatment options, including risks, benefits, alternatives, complications of surgical and nonsurgical conservative options, the patient elected surgical repair.   The patient  is aware of the material risks  and complications including, but not limited to injury to adjacent structures, neurovascular injury, infection, numbness, bleeding, implant failure, thermal burns, stiffness, persistent pain, failure to heal, disease transmission from allograft, need for further surgery, dislocation, anesthetic risks, blood clots, risks of death,and others. The probabilities of surgical success and failure discussed with patient given their particular co-morbidities.The time and nature of expected rehabilitation and recovery was discussed.The  patient's questions  were all answered preoperatively.  No barriers to understanding were noted. I explained the natural history of the disease process and Rx rationale.  I explained to the patient what I considered to be reasonable expectations given their personal situation.  The final treatment plan was arrived at through a shared patient decision making process model.   Thank you for the consult and the opportunity to see Ms. Bostic  Huel Cote, MD [facility] 7:54 AM

## 2023-05-02 NOTE — Anesthesia Procedure Notes (Signed)
 Procedure Name: Intubation Date/Time: 05/02/2023 5:02 PM  Performed by: Cy Blamer, CRNAPre-anesthesia Checklist: Patient identified, Emergency Drugs available, Suction available and Patient being monitored Patient Re-evaluated:Patient Re-evaluated prior to induction Oxygen Delivery Method: Circle system utilized Preoxygenation: Pre-oxygenation with 100% oxygen Induction Type: IV induction Ventilation: Mask ventilation without difficulty Laryngoscope Size: [name] and 2 Grade View: Grade I Tube type: Oral Tube size: 7.0 mm Number of attempts: 1 Airway Equipment and Method: Stylet and Oral airway Placement Confirmation: ETT inserted through vocal cords under direct vision, positive ETCO2 and breath sounds checked- equal and bilateral Secured at: 21 cm Tube secured with: Tape Dental Injury: Teeth and Oropharynx as per pre-operative assessment

## 2023-05-02 NOTE — Op Note (Signed)
 Date of Surgery: 05/02/2023  INDICATIONS: [name] is a 73 y.o.-year-old female with right distal femoral periprosthetic fracture status post previous total knee arthroplasty.  The risk and benefits of the procedure were discussed in detail and documented in the pre-operative evaluation.   PREOPERATIVE DIAGNOSIS: 1.  Right distal femoral periprosthetic fracture  POSTOPERATIVE DIAGNOSIS: Same.  PROCEDURE: 1.  Right knee distal femoral plating  SURGEON: Benancio Deeds MD  ASSISTANT: Ardeen Fillers, ATC  ANESTHESIA:  general  IV FLUIDS AND URINE: See anesthesia record.  ANTIBIOTICS: Ancef  ESTIMATED BLOOD LOSS: 50 mL.  IMPLANTS:  Implant Name Type Inv. Item Serial No. Manufacturer Lot No. LRB No. Used Action  CAP LOCK NCB - [id number] Cap CAP LOCK NCB  [employer](ORTH,TRAU,BIO,SG)  Right 5 Implanted  SCREW 5.0 - [id number] Screw SCREW 5.0  [employer](ORTH,TRAU,BIO,SG)  Right 1 Implanted  SCREW CORTICAL NCB 5.0X44 - [id number] Screw SCREW CORTICAL NCB 5.0X44  [employer](ORTH,TRAU,BIO,SG)  Right 1 Implanted  SCREW NCB 5.7Q46N6E9.2XST - [id number] Screw SCREW NCB [id number]  [employer](ORTH,TRAU,BIO,SG)  Right 2 Implanted  SCREW 5.0 - [id number] Screw SCREW 5.0  [employer](ORTH,TRAU,BIO,SG)  Right 2 Implanted  SCREW NCB 4.0MX34M - [id number] Screw SCREW NCB 4.0MX34M  Z[employer](ORTH,TRAU,BIO,SG)  Right 2 Implanted  SCREW NCB 4.4IH47Q - [id number] Screw SCREW NCB 4.4PP29J  [employer](ORTH,TRAU,BIO,SG)  Right 1 Implanted  PLATE FEMUR DISTAL NCB 5H - [id number] Plate PLATE FEMUR DISTAL NCB 5H  [employer](ORTH,TRAU,BIO,SG)  Right 1 Implanted    DRAINS: None  CULTURES: None  COMPLICATIONS: none  DESCRIPTION OF PROCEDURE:   Patient was identified in the preoperative holding area.  The correct site was marked according to universal protocol with nursing.  She is subsequently taken back to the operating room.  Antibiotics were given 1 hour prior  to skin incision.  She was moved over the operating room table.  Anesthesia was induced.  She was prepped and draped in the usual sterile fashion.  Bone foam was used under the leg with a bump under the right hip.  Began with a lateral approach to the distal femur.  50 blade was used to incise through skin.  Electrocautery was used to achieve hemostasis.  15 blade was used to incise through IT band.  The lateral femur was identified.  This time reduction was performed with a bone hook and manual inline traction.  This provided excellent reduction.  At this time a plate was selected and this was attached to a jig and slid underneath the IT band in a submuscular fashion.  This was locked into place distally and proximally using a K wire.  At this time a distal screw was placed to lock the distal bone block into the plate.  A series of percutaneous stab wounds were utilized through the outrigger guide to place 3 additional screws proximally.  I was happy with the reduction profile in AP and lateral views.  At this time the remaining distal screws were placed all with care to drill through the cement mantle of the total knee arthroplasty.  Locking caps were placed over these.  The wound was thoroughly irrigated.  Vancomycin powder was placed.  The wound was closed in layers of 0 Vicryl for the IT band 2-0 Vicryl and staples.  Soft dressing with Xeroform gauze Webril and Ace wrap was applied.  The knee immobilizer which he was placed back.  Counts correct at the end of the case she was taken the PACU  without complication    POSTOPERATIVE PLAN: Will be nonweightbearing on the right leg.  She will begin to work with physical therapy for assistance with transfers.  Benancio Deeds, MD 6:45 PM

## 2023-05-02 NOTE — Discharge Instructions (Signed)
 Discharge Instructions    Attending Surgeon: Huel Cote, MD Office Phone Number: [phone]   Diagnosis and Procedures:    Surgeries Performed: Right distal femoral plating  Discharge Plan:    Diet: Resume usual diet. Begin with light or bland foods.  Drink plenty of fluids.  Activity:  Non weight bearing right leg. You are advised to go home directly from the hospital or surgical center. Restrict your activities.  GENERAL INSTRUCTIONS: 1.  Please apply ice to your wound to help with swelling and inflammation. This will improve your comfort and your overall recovery following surgery.     2. Please call Dr. Serena Croissant office at [phone] with questions Monday-Friday during business hours. If no one answers, please leave a message and someone should get back to the patient within 24 hours. For emergencies please call 911 or proceed to the emergency room.   3. Patient to notify surgical team if experiences any of the following: Bowel/Bladder dysfunction, uncontrolled pain, nerve/muscle weakness, incision with increased drainage or redness, nausea/vomiting and Fever greater than 101.0 F.  Be alert for signs of infection including redness, streaking, odor, fever or chills. Be alert for excessive pain or bleeding and notify your surgeon immediately.  WOUND INSTRUCTIONS:   Leave your dressing, cast, or splint in place until your post operative visit.  Keep it clean and dry.  Always keep the incision clean and dry until the staples/sutures are removed. If there is no drainage from the incision you should keep it open to air. If there is drainage from the incision you must keep it covered at all times until the drainage stops  Do not soak in a bath tub, hot tub, pool, lake or other body of water until 21 days after your surgery and your incision is completely dry and healed.  If you have removable sutures (or staples) they must be removed 10-14 days (unless otherwise  instructed) from the day of your surgery.     1)  Elevate the extremity as much as possible.  2)  Keep the dressing clean and dry.  3)  Please call us if the dressing becomes wet or dirty.  4)  If you are experiencing worsening pain or worsening swelling, please call.     MEDICATIONS: Resume all previous home medications at the previous prescribed dose and frequency unless otherwise noted Start taking the  pain medications on an as-needed basis as prescribed  Please taper down pain medication over the next week following surgery.  Ideally you should not require a refill of any narcotic pain medication.  Take pain medication with food to minimize nausea. In addition to the prescribed pain medication, you Wuellner take over-the-counter pain relievers such as Tylenol.  Do NOT take additional tylenol if your pain medication already has tylenol in it.  Aspirin 325mg  daily per instructions on bottle. Narcotic policy: Per [facility] clinic policy, our goal is ensure optimal postoperative pain control with a multimodal pain management strategy. For all [facility] patients, our goal is to wean post-operative narcotic medications by 6 weeks post-operatively, and many times sooner. If this is not possible due to utilization of pain medication prior to surgery, your [facility] doctor will support your acute post-operative pain control for the first 6 weeks postoperatively, with a plan to transition you back to your primary pain team following that. Cyndia Skeeters will work to ensure a Therapist, occupational.       FOLLOWUP INSTRUCTIONS: 1. Follow up at the Physical Therapy Clinic  3-4 days following surgery. This appointment should be scheduled unless other arrangements have been made.The Physical Therapy scheduling number is [phone] if an appointment has not already been arranged.  2. Contact Dr. Serena Croissant office during office hours at [phone] or the practice after hours line at [phone] for non-emergencies.  For medical emergencies call 911.

## 2023-05-02 NOTE — Anesthesia Postprocedure Evaluation (Signed)
 Anesthesia Post Note  Patient: [name] [name]  Procedure(s) Performed: OPEN REDUCTION INTERNAL FIXATION (ORIF) DISTAL FEMUR FRACTURE (Right: Thigh)     Patient location during evaluation: PACU Anesthesia Type: General Level of consciousness: awake and alert Pain management: pain level controlled Vital Signs Assessment: post-procedure vital signs reviewed and stable Respiratory status: spontaneous breathing, nonlabored ventilation, respiratory function stable and patient connected to nasal cannula oxygen Cardiovascular status: blood pressure returned to baseline and stable Postop Assessment: no apparent nausea or vomiting Anesthetic complications: no  No notable events documented.  Last Vitals:  Vitals:   05/02/23 1945 05/02/23 2009  BP: (!) 100/49 122/60  Pulse: 86 89  Resp: 12   Temp: 36.7 C 37.4 C  SpO2: 97% 98%    Last Pain:  Vitals:   05/02/23 2009  TempSrc: Oral  PainSc:                  Ashantae Pangallo,W. EDMOND

## 2023-05-02 NOTE — Anesthesia Preprocedure Evaluation (Addendum)
 Anesthesia Evaluation  Patient identified by MRN, date of birth, ID band Patient awake    Reviewed: Allergy & Precautions, NPO status , Patient's Chart, lab work & pertinent test results, reviewed documented beta blocker date and time   History of Anesthesia Complications Negative for: history of anesthetic complications  Airway Mallampati: I  TM Distance: >3 FB Neck ROM: Full    Dental  (+) Edentulous Upper, Edentulous Lower   Pulmonary asthma , sleep apnea , COPD,  COPD inhaler   breath sounds clear to auscultation       Cardiovascular hypertension, Pt. on medications and Pt. on home beta blockers (-) angina  Rhythm:Regular Rate:Normal     Neuro/Psych  Headaches  Anxiety Depression       GI/Hepatic ,GERD  Medicated and Controlled,,(+) Cirrhosis   Esophageal Varices      Endo/Other  diabetes (glu 146), Insulin DependentHypothyroidism  Semaglutide BMI 47  Renal/GU Renal InsufficiencyRenal disease     Musculoskeletal  (+) Arthritis ,    Abdominal  (+) + obese  Peds  Hematology  (+) Blood dyscrasia (Hb 10.9, plt 126k)   Anesthesia Other Findings H/o breast cancer  Reproductive/Obstetrics                             Anesthesia Physical Anesthesia Plan  ASA: 3  Anesthesia Plan: General   Post-op Pain Management: Tylenol PO (pre-op)*   Induction: Intravenous  PONV Risk Score and Plan: 3 and Ondansetron, Dexamethasone and Treatment Leas vary due to age or medical condition  Airway Management Planned: Oral ETT  Additional Equipment: None  Intra-op Plan:   Post-operative Plan: Extubation in OR  Informed Consent: I have reviewed the patients History and Physical, chart, labs and discussed the procedure including the risks, benefits and alternatives for the proposed anesthesia with the patient or authorized representative who has indicated his/her understanding and acceptance.      Consent reviewed with POA  Plan Discussed with: CRNA and Surgeon  Anesthesia Plan Comments: (Consent from patient and her husband)        Anesthesia Quick Evaluation

## 2023-05-03 ENCOUNTER — Inpatient Hospital Stay (HOSPITAL_COMMUNITY)

## 2023-05-03 ENCOUNTER — Encounter (HOSPITAL_COMMUNITY): Payer: Self-pay | Admitting: Orthopaedic Surgery

## 2023-05-03 DIAGNOSIS — S72001A Fracture of unspecified part of neck of right femur, initial encounter for closed fracture: Secondary | ICD-10-CM | POA: Diagnosis not present

## 2023-05-03 LAB — CBC
HCT: 27.3 % — ABNORMAL LOW (ref 36.0–46.0)
Hemoglobin: 8.8 g/dL — ABNORMAL LOW (ref 12.0–15.0)
MCH: 28.8 pg (ref 26.0–34.0)
MCHC: 32.2 g/dL (ref 30.0–36.0)
MCV: 89.2 fL (ref 80.0–100.0)
Platelets: 70 10*3/uL — ABNORMAL LOW (ref 150–400)
RBC: 3.06 MIL/uL — ABNORMAL LOW (ref 3.87–5.11)
RDW: 15.4 % (ref 11.5–15.5)
WBC: 6.1 10*3/uL (ref 4.0–10.5)
nRBC: 0 % (ref 0.0–0.2)

## 2023-05-03 LAB — BASIC METABOLIC PANEL WITH GFR
Anion gap: 9 (ref 5–15)
BUN: 36 mg/dL — ABNORMAL HIGH (ref 8–23)
CO2: 22 mmol/L (ref 22–32)
Calcium: 8.9 mg/dL (ref 8.9–10.3)
Chloride: 103 mmol/L (ref 98–111)
Creatinine, Ser: 2.2 mg/dL — ABNORMAL HIGH (ref 0.44–1.00)
GFR, Estimated: 23 mL/min — ABNORMAL LOW (ref >60)
Glucose, Bld: 180 mg/dL — ABNORMAL HIGH (ref 70–99)
Potassium: 4.6 mmol/L (ref 3.5–5.1)
Sodium: 134 mmol/L — ABNORMAL LOW (ref 135–145)

## 2023-05-03 LAB — GLUCOSE, CAPILLARY
Glucose-Capillary: 176 mg/dL — ABNORMAL HIGH (ref 70–99)
Glucose-Capillary: 176 mg/dL — ABNORMAL HIGH (ref 70–99)
Glucose-Capillary: 162 mg/dL — ABNORMAL HIGH (ref 70–99)
Glucose-Capillary: 160 mg/dL — ABNORMAL HIGH (ref 70–99)

## 2023-05-03 MED ORDER — SODIUM CHLORIDE 0.9 % IV BOLUS
1000.0000 mL | Freq: Once | INTRAVENOUS | Status: AC
  Administered 2023-05-03: 1000 mL via INTRAVENOUS

## 2023-05-03 MED ORDER — ENOXAPARIN SODIUM 30 MG/0.3ML IJ SOSY
30.0000 mg | PREFILLED_SYRINGE | INTRAMUSCULAR | Status: DC
  Administered 2023-05-03: 30 mg via SUBCUTANEOUS
  Filled 2023-05-03: qty 0.3

## 2023-05-03 MED ORDER — SODIUM CHLORIDE 0.9 % IV SOLN: INTRAVENOUS | Status: AC

## 2023-05-03 NOTE — Evaluation (Signed)
 Physical Therapy Evaluation Patient Details Name: [name] [name] MRN: [id number] DOB: 1950-08-25 Today's Date: 05/03/2023  History of Present Illness  73 yo female presents to [facility] on 3/30 for R knee pain s/p fall getting into truck. Pt sustained R periprosthetic femoral fx, minimally displaced fibular neck fx. S/p ORIF distal femur fx on 3/31. PMH includes anxiety, L and R breast cancer s/p lumpectomy and radiation, DMII with neuropathy, gout, HTN, liver cirrhosis, R ankle fusion 2022, bilat TKR, R reverse TSA, obesity.  Clinical Impression   Pt presents with severe RLE pain, impaired RLE strength and ROM, impaired balance with history of falls and neuropathy, decreased activity tolerance. Pt to benefit from acute PT to address deficits. Pt requiring total +2 assist for bed mobility, unable to progress OOB given severe RLE pain and debility. PT discussed d/c options with pt and family, would recommend SNF but pt/family insistent on d/c home. Will need a hoyer lift and max HH services if d/c home.  PT to progress mobility as tolerated, and will continue to follow acutely.          If plan is discharge home, recommend the following: Two people to help with walking and/or transfers;Two people to help with bathing/dressing/bathroom   Can travel by private vehicle        Equipment Recommendations Hoyer lift  Recommendations for Other Services       Functional Status Assessment Patient has had a recent decline in their functional status and/or demonstrates limited ability to make significant improvements in function in a reasonable and predictable amount of time     Precautions / Restrictions Precautions Precautions: Fall Restrictions Weight Bearing Restrictions Per Provider Order: Yes RLE Weight Bearing Per Provider Order: Non weight bearing      Mobility  Bed Mobility Overal bed mobility: Needs Assistance Bed Mobility: Rolling, Supine to Sit, Sit to Supine Rolling: Total assist    Supine to sit: Total assist, +2 for physical assistance Sit to supine: Total assist, +2 for physical assistance   General bed mobility comments: total assist for hip and trunk translation bilat, cues for sequencing. total +2 for supine<>sit for trunk and LE management, scootign to/from EOB with bed pad assist, and boost up in bed upon return to supine. Pt able to initiate with UEs.    Transfers                   General transfer comment: unable    Ambulation/Gait                  Stairs            Wheelchair Mobility     Tilt Bed    Modified Rankin (Stroke Patients Only)       Balance Overall balance assessment: Needs assistance, History of Falls   Sitting balance-Leahy Scale: Poor Sitting balance - Comments: reliant on PT assist for gaining and maintaining seated balance, can sit briefly without PT assist if static with UE support Postural control: Left lateral lean                                   Pertinent Vitals/Pain Pain Assessment Pain Assessment: 0-10 Pain Score: 8  Pain Location: RLE Pain Descriptors / Indicators: Crying, Moaning, Sore Pain Intervention(s): Limited activity within patient's tolerance, Monitored during session, Repositioned    Home Living Family/patient expects to be discharged to:: Private residence Living  Arrangements: Spouse/significant other Available Help at Discharge: Family;Available 24 hours/day Type of Home: House Home Access: Ramped entrance       Home Layout: One level Home Equipment: Shower seat;Wheelchair - Electronics engineer (4 wheels);Rolling Walker (2 wheels)      Prior Function Prior Level of Function : Needs assist             Mobility Comments: mostly transfer-level to/from w/c, occasionally using rollator ADLs Comments: assist for donning socks, otherwise independent. Pt's spouse cooks     Extremity/Trunk Assessment   Upper Extremity  Assessment Upper Extremity Assessment: Defer to OT evaluation    Lower Extremity Assessment Lower Extremity Assessment: Generalized weakness;RLE deficits/detail RLE Deficits / Details: post-operative weakness and pain; able to perform limited ROM heel slide active assisted, weak quad set with cues, all limited by pain RLE: Unable to fully assess due to pain RLE Sensation: history of peripheral neuropathy    Cervical / Trunk Assessment Cervical / Trunk Assessment: Other exceptions Cervical / Trunk Exceptions: abdominal obesity  Communication   Communication Communication: No apparent difficulties    Cognition Arousal: Alert Behavior During Therapy: Anxious   PT - Cognitive impairments: No apparent impairments                                 Cueing       General Comments General comments (skin integrity, edema, etc.): min blood noted to RLE dressing. Pt saturated in urine, PT assisting with linen change and pericare    Exercises     Assessment/Plan    PT Assessment Patient needs continued PT services  PT Problem List Decreased strength;Decreased mobility;Decreased balance;Decreased activity tolerance;Decreased range of motion;Decreased safety awareness;Decreased knowledge of use of DME;Pain;Obesity;Cardiopulmonary status limiting activity;Decreased knowledge of precautions       PT Treatment Interventions DME instruction;Therapeutic activities;Therapeutic exercise;Patient/family education;Balance training;Functional mobility training;Neuromuscular re-education    PT Goals (Current goals can be found in the Care Plan section)  Acute Rehab PT Goals PT Goal Formulation: With patient/family Time For Goal Achievement: 05/17/23 Potential to Achieve Goals: Fair    Frequency Min 3X/week     Co-evaluation               AM-PAC PT "6 Clicks" Mobility  Outcome Measure Help needed turning from your back to your side while in a flat bed without using  bedrails?: Total Help needed moving from lying on your back to sitting on the side of a flat bed without using bedrails?: Total Help needed moving to and from a bed to a chair (including a wheelchair)?: Total Help needed standing up from a chair using your arms (e.g., wheelchair or bedside chair)?: Total Help needed to walk in hospital room?: Total Help needed climbing 3-5 steps with a railing? : Total 6 Click Score: 6    End of Session Equipment Utilized During Treatment: Oxygen Activity Tolerance: Patient limited by pain;Patient limited by fatigue Patient left: in bed;with call bell/phone within reach;with bed alarm set;with family/visitor present Nurse Communication: Mobility status;Weight bearing status;Precautions (full linen change, needs SCDs) PT Visit Diagnosis: Other abnormalities of gait and mobility (R26.89);Muscle weakness (generalized) (M62.81)    Time: 1610-9604 PT Time Calculation (min) (ACUTE ONLY): 34 min   Charges:   PT Evaluation $PT Eval Moderate Complexity: 1 Mod PT Treatments $Therapeutic Activity: 8-22 mins PT General Charges $$ ACUTE PT VISIT: 1 Visit         Tadeusz Stahl S,  PT DPT Acute Rehabilitation Services Secure Chat Preferred  Office [phone]   Truddie Coco 05/03/2023, 1:33 PM

## 2023-05-03 NOTE — Progress Notes (Signed)
   Subjective:  Patient reports pain as moderate.Tolerating some diet. Overall improved after stabilizaiton.  Objective:   VITALS:   Vitals:   05/02/23 1930 05/02/23 1945 05/02/23 2009 05/03/23 0529  BP: (!) 112/45 (!) 100/49 122/60 (!) 106/57  Pulse: 88 86 89 91  Resp: 16 12  14   Temp:  98 F (36.7 C) 99.4 F (37.4 C) 99.6 F (37.6 C)  TempSrc:   Oral Oral  SpO2: 97% 97% 98% 94%  Weight:      Height:        Neurologically intact Neurovascular intact  Dressing CDI, wwp toes  Lab Results  Component Value Date   WBC 6.1 05/03/2023   HGB 8.8 (L) 05/03/2023   HCT 27.3 (L) 05/03/2023   MCV 89.2 05/03/2023   PLT 70 (L) 05/03/2023     Assessment/Plan:  1 Day Post-Op right leg distal femoral plating  - Expected postop acute blood loss anemia - will monitor for symptoms - Patient to work with PT to optimize mobilization safely - DVT ppx - SCDs, ambulation, Lovenox - Postoperative Abx: Ancef x 2 additional doses given - NWB operative extremity - Pain control - multimodal pain management, ATC acetaminophen in conjunction with as needed narcotic (oxycodone), although this should be minimized with other modalities   Carmita Boom 05/03/2023, 10:53 AM

## 2023-05-03 NOTE — Progress Notes (Signed)
 PROGRESS NOTE  Krystall Kruckenberg Edwin OZH:[id number] DOB: 12/12/1950 DOA: 05/01/2023 PCP: Anabel Halon, MD   HPI/Recap of past 24 hours: [name] [name] is a 73 y.o. female with medical history significant of ambulatory dysfunction, left foot droop, obesity, history of breast cancer, T2DM, and ambulatory dysfunction who presented after a fall, landing on her R knee while trying to get into her family's truck PTA.  Noted severe pain on right lower extremity.  EMS was activated, splint was placed on her right leg and sent to the ED.  Of note, patient uses a wheelchair for mobility for several years due to back pain and left foot droop. She is able to make a few steps using a walker inside her home, but most of the time needs a wheelchair, recently diagnosed with vertigo.  In the ED, vital signs fairly stable except for uncontrolled BP.  Labs fairly stable except for chronic thrombocytopenia.  X-ray right knee showed total right knee arthroplasty with acute periprosthetic half shaft width posterior displaced and commuted fracture of the distal femoral metadiaphysis, acute minimally displaced fibular neck fracture.  Orthopedics consulted.  Patient admitted for further management.    Today, patient with poor urine output, bolused, with improved urine output.  Denies any other new complaints.   Assessment/Plan: Principal Problem:   Right femoral fracture (HCC) Active Problems:   Essential hypertension   Type 2 diabetes mellitus with hyperlipidemia (HCC)   Thrombocytopenia (HCC)   Class 3 obesity   Malignant neoplasm of upper-outer quadrant of left breast in female, estrogen receptor positive (HCC)   Vertigo   Cirrhosis of liver without ascites (HCC)   Hypothyroidism   Right periprosthetic distal femur fracture X-ray right knee showed total right knee arthroplasty with acute periprosthetic half shaft width posterior displaced and commuted fracture of the distal femoral metadiaphysis, acute minimally  displaced fibular neck fracture Orthopedics on board, s/p right leg distal femoral plate on 07/31/5282 Pain management PT/OT, home health PT  AKI Creatinine noted to be 1.72--> 2.2 from baseline of normal Bladder scan inaccurate due to large body habitus In-N-Out cath as needed UA pending, Renal USS pending Received IV bolus on 4/1 with increased urine output, continue IV fluid hydration Daily BMP, if worsening, Sledd need nephrology consult   Essential hypertension BP soft Hold home losartan and carvedilol   Type 2 diabetes mellitus with hyperlipidemia  A1c 6.5 SSI, Accu-Cheks, hypoglycemic protocol Diabetic neuropathy, continue with gabapentin    Anemia/thrombocytopenia/2 chronic disease Likely 2/2 history of malignancy, on Aromasin, now with recent surgery Daily CBC, monitor closely  Malignant neoplasm of upper-outer quadrant of left breast in female, estrogen receptor positive (HCC) Continue with aromasin  Outpatient oncology follow-up  Vertigo Follow up with PT and OT post surgical intervention    Cirrhosis of liver without ascites (HCC) Likely NASH, no signs of acute decompensation  Follow up as outpatient   Hypothyroidism Continue with levothyroxine    Class 3 obesity Calculate BMI           Estimated body mass index is 46.86 kg/m as calculated from the following:   Height as of this encounter: 5\' 1"  (1.549 m).   Weight as of this encounter: 112.5 kg.     Code Status: Full  Family Communication: Discussed with husband at bedside  Disposition Plan: Status is: Inpatient Remains inpatient appropriate because: Level of care      Consultants: Orthopedics  Procedures: As noted above  Antimicrobials: None  DVT prophylaxis: Lovenox  Objective: Vitals:   05/02/23 1945 05/02/23 2009 05/03/23 0529 05/03/23 1541  BP: (!) 100/49 122/60 (!) 106/57 (!) 129/42  Pulse: 86 89 91 (!) 105  Resp: 12  14 20   Temp: 98 F (36.7 C) 99.4 F (37.4 C)  99.6 F (37.6 C) 98.5 F (36.9 C)  TempSrc:  Oral Oral Oral  SpO2: 97% 98% 94% (!) 86%  Weight:      Height:        Intake/Output Summary (Last 24 hours) at 05/03/2023 1828 Last data filed at 05/03/2023 0945 Gross per 24 hour  Intake 2060.55 ml  Output 100 ml  Net 1960.55 ml   Filed Weights   05/02/23 1556  Weight: 112.5 kg    Exam: General: NAD  Cardiovascular: S1, S2 present Respiratory: Diminished breath sounds bilaterally Abdomen: Soft, nontender, nondistended, bowel sounds present Musculoskeletal: No bilateral pedal edema noted Skin: Normal Psychiatry: Normal mood     Data Reviewed: CBC: Recent Labs  Lab 05/01/23 1336 05/01/23 1611 05/02/23 0740 05/03/23 0624  WBC 3.2* 4.6 6.9 6.1  NEUTROABS 2.2  --   --   --   HGB 11.8* 11.1* 10.9* 8.8*  HCT 36.8 33.8* 35.2* 27.3*  MCV 87.8 86.7 89.3 89.2  PLT 68* 73* 126* 70*   Basic Metabolic Panel: Recent Labs  Lab 05/01/23 1336 05/01/23 1611 05/02/23 0740 05/03/23 0624  NA 133*  --  135 134*  K 3.9  --  4.7 4.6  CL 103  --  102 103  CO2 21*  --  24 22  GLUCOSE 178*  --  172* 180*  BUN 17  --  24* 36*  CREATININE 0.76 0.80 1.72* 2.20*  CALCIUM 9.3  --  9.6 8.9   GFR: Estimated Creatinine Clearance: 26.9 mL/min (A) (by C-G formula based on SCr of 2.2 mg/dL (H)). Liver Function Tests: No results for input(s): "AST", "ALT", "ALKPHOS", "BILITOT", "PROT", "ALBUMIN" in the last 168 hours. No results for input(s): "LIPASE", "AMYLASE" in the last 168 hours. No results for input(s): "AMMONIA" in the last 168 hours. Coagulation Profile: No results for input(s): "INR", "PROTIME" in the last 168 hours. Cardiac Enzymes: No results for input(s): "CKTOTAL", "CKMB", "CKMBINDEX", "TROPONINI" in the last 168 hours. BNP (last 3 results) No results for input(s): "PROBNP" in the last 8760 hours. HbA1C: Recent Labs    05/01/23 1709  HGBA1C 6.5*   CBG: Recent Labs  Lab 05/02/23 1608 05/02/23 1933 05/03/23 0632  05/03/23 1114 05/03/23 1710  GLUCAP 128* 142* 176* 176* 162*   Lipid Profile: No results for input(s): "CHOL", "HDL", "LDLCALC", "TRIG", "CHOLHDL", "LDLDIRECT" in the last 72 hours. Thyroid Function Tests: No results for input(s): "TSH", "T4TOTAL", "FREET4", "T3FREE", "THYROIDAB" in the last 72 hours. Anemia Panel: No results for input(s): "VITAMINB12", "FOLATE", "FERRITIN", "TIBC", "IRON", "RETICCTPCT" in the last 72 hours. Urine analysis:    Component Value Date/Time   COLORURINE YELLOW 08/30/2018 1357   APPEARANCEUR Clear 11/17/2022 0839   LABSPEC 1.024 08/30/2018 1357   PHURINE 5.0 08/30/2018 1357   GLUCOSEU 3+ (A) 11/17/2022 0839   HGBUR NEGATIVE 08/30/2018 1357   BILIRUBINUR Negative 11/17/2022 0839   KETONESUR NEGATIVE 08/30/2018 1357   PROTEINUR 2+ (A) 11/17/2022 0839   PROTEINUR NEGATIVE 08/30/2018 1357   UROBILINOGEN 0.2 12/09/2011 1907   NITRITE Negative 11/17/2022 0839   NITRITE NEGATIVE 08/30/2018 1357   LEUKOCYTESUR Trace (A) 11/17/2022 0839   LEUKOCYTESUR NEGATIVE 08/30/2018 1357   Sepsis Labs: @LABRCNTIP (procalcitonin:4,lacticidven:4)  ) Recent Results (from the past 240  hours)  Surgical pcr screen     Status: Abnormal   Collection Time: 05/02/23  5:21 AM   Specimen: Nasal Mucosa; Nasal Swab  Result Value Ref Range Status   MRSA, PCR NEGATIVE NEGATIVE Final   Staphylococcus aureus POSITIVE (A) NEGATIVE Final    Comment: (NOTE) The Xpert SA Assay (FDA approved for NASAL specimens in patients 14 years of age and older), is one component of a comprehensive surveillance program. It is not intended to diagnose infection nor to guide or monitor treatment. Performed at [facility], [address] [place], [place] [address]       Studies: DG FEMUR, MIN 2 VIEWS RIGHT Result Date: 05/02/2023 CLINICAL DATA:  ORIF EXAM: RIGHT FEMUR 2 VIEWS COMPARISON:  05/01/2023 FINDINGS: Multiple intraoperative spot images demonstrate plate and screw fixation across  the distal femoral fracture. No hardware complicating feature. Near anatomic alignment. IMPRESSION: Internal fixation.  No visible complicating feature. Electronically Signed   By: Charlett Nose M.D.   On: 05/02/2023 22:15   DG C-Arm 1-60 Min-No Report Result Date: 05/02/2023 Fluoroscopy was utilized by the requesting physician.  No radiographic interpretation.   DG C-Arm 1-60 Min-No Report Result Date: 05/02/2023 Fluoroscopy was utilized by the requesting physician.  No radiographic interpretation.    Scheduled Meds:  buPROPion  150 mg Oral Daily   Chlorhexidine Gluconate Cloth  6 each Topical Daily   enoxaparin (LOVENOX) injection  30 mg Subcutaneous Q24H   exemestane  25 mg Oral QPC breakfast   insulin aspart  0-15 Units Subcutaneous TID WC   levothyroxine  150 mcg Oral Q0600   mirabegron ER  25 mg Oral Daily   mupirocin ointment  1 Application Nasal BID   pantoprazole  40 mg Oral Daily    Continuous Infusions:  sodium chloride 100 mL/hr at 05/03/23 0942     LOS: 2 days     Briant Cedar, MD [facility]  If 7PM-7AM, please contact night-coverage [email or web address] 05/03/2023, 6:28 PM

## 2023-05-03 NOTE — Plan of Care (Signed)
  Problem: Coping: Goal: Level of anxiety will decrease Outcome: Progressing   Problem: Pain Managment: Goal: General experience of comfort will improve and/or be controlled Outcome: Progressing   Problem: Pain Management: Goal: Pain level will decrease Outcome: Progressing

## 2023-05-04 ENCOUNTER — Encounter (HOSPITAL_COMMUNITY): Payer: Self-pay | Admitting: Orthopaedic Surgery

## 2023-05-04 DIAGNOSIS — D696 Thrombocytopenia, unspecified: Secondary | ICD-10-CM | POA: Diagnosis not present

## 2023-05-04 DIAGNOSIS — I10 Essential (primary) hypertension: Secondary | ICD-10-CM | POA: Diagnosis not present

## 2023-05-04 DIAGNOSIS — S72451A Displaced supracondylar fracture without intracondylar extension of lower end of right femur, initial encounter for closed fracture: Secondary | ICD-10-CM | POA: Diagnosis not present

## 2023-05-04 DIAGNOSIS — E66813 Obesity, class 3: Secondary | ICD-10-CM

## 2023-05-04 DIAGNOSIS — E1169 Type 2 diabetes mellitus with other specified complication: Secondary | ICD-10-CM | POA: Diagnosis not present

## 2023-05-04 LAB — BASIC METABOLIC PANEL WITH GFR
Anion gap: 5 (ref 5–15)
BUN: 26 mg/dL — ABNORMAL HIGH (ref 8–23)
CO2: 20 mmol/L — ABNORMAL LOW (ref 22–32)
Calcium: 8.6 mg/dL — ABNORMAL LOW (ref 8.9–10.3)
Chloride: 107 mmol/L (ref 98–111)
Creatinine, Ser: 1.05 mg/dL — ABNORMAL HIGH (ref 0.44–1.00)
GFR, Estimated: 56 mL/min — ABNORMAL LOW (ref >60)
Glucose, Bld: 171 mg/dL — ABNORMAL HIGH (ref 70–99)
Potassium: 4.4 mmol/L (ref 3.5–5.1)
Sodium: 132 mmol/L — ABNORMAL LOW (ref 135–145)

## 2023-05-04 LAB — URINALYSIS, ROUTINE W REFLEX MICROSCOPIC
Bacteria, UA: NONE SEEN
Bilirubin Urine: NEGATIVE
Glucose, UA: NEGATIVE mg/dL
Ketones, ur: NEGATIVE mg/dL
Leukocytes,Ua: NEGATIVE
Nitrite: NEGATIVE
Protein, ur: 100 mg/dL — AB
Specific Gravity, Urine: 1.013 (ref 1.005–1.030)
pH: 5 (ref 5.0–8.0)

## 2023-05-04 LAB — CBC WITH DIFFERENTIAL/PLATELET
Abs Immature Granulocytes: 0.01 10*3/uL (ref 0.00–0.07)
Basophils Absolute: 0 10*3/uL (ref 0.0–0.1)
Basophils Relative: 1 %
Eosinophils Absolute: 0 10*3/uL (ref 0.0–0.5)
Eosinophils Relative: 1 %
HCT: 23.8 % — ABNORMAL LOW (ref 36.0–46.0)
Hemoglobin: 7.7 g/dL — ABNORMAL LOW (ref 12.0–15.0)
Immature Granulocytes: 0 %
Lymphocytes Relative: 18 %
Lymphs Abs: 0.5 10*3/uL — ABNORMAL LOW (ref 0.7–4.0)
MCH: 28.8 pg (ref 26.0–34.0)
MCHC: 32.4 g/dL (ref 30.0–36.0)
MCV: 89.1 fL (ref 80.0–100.0)
Monocytes Absolute: 0.5 10*3/uL (ref 0.1–1.0)
Monocytes Relative: 16 %
Neutro Abs: 1.8 10*3/uL (ref 1.7–7.7)
Neutrophils Relative %: 64 %
Platelets: 44 10*3/uL — ABNORMAL LOW (ref 150–400)
RBC: 2.67 MIL/uL — ABNORMAL LOW (ref 3.87–5.11)
RDW: 15.4 % (ref 11.5–15.5)
WBC: 2.9 10*3/uL — ABNORMAL LOW (ref 4.0–10.5)
nRBC: 0 % (ref 0.0–0.2)

## 2023-05-04 LAB — GLUCOSE, CAPILLARY
Glucose-Capillary: 154 mg/dL — ABNORMAL HIGH (ref 70–99)
Glucose-Capillary: 216 mg/dL — ABNORMAL HIGH (ref 70–99)
Glucose-Capillary: 137 mg/dL — ABNORMAL HIGH (ref 70–99)
Glucose-Capillary: 143 mg/dL — ABNORMAL HIGH (ref 70–99)

## 2023-05-04 MED ORDER — GABAPENTIN 400 MG PO CAPS
1200.0000 mg | ORAL_CAPSULE | Freq: Every day | ORAL | Status: DC
  Administered 2023-05-04 – 2023-05-05 (×2): 1200 mg via ORAL
  Filled 2023-05-04 (×2): qty 3

## 2023-05-04 MED ORDER — MYCOPHENOLATE MOFETIL 250 MG PO CAPS
1500.0000 mg | ORAL_CAPSULE | Freq: Two times a day (BID) | ORAL | Status: DC
Start: 2023-05-04 — End: 2023-05-06
  Administered 2023-05-04 – 2023-05-06 (×4): 1500 mg via ORAL
  Filled 2023-05-04 (×7): qty 6

## 2023-05-04 MED ORDER — GABAPENTIN 300 MG PO CAPS
600.0000 mg | ORAL_CAPSULE | Freq: Two times a day (BID) | ORAL | Status: DC
  Administered 2023-05-04 – 2023-05-06 (×4): 600 mg via ORAL
  Filled 2023-05-04 (×4): qty 2

## 2023-05-04 MED ORDER — ENOXAPARIN SODIUM 40 MG/0.4ML IJ SOSY: 40.0000 mg | PREFILLED_SYRINGE | INTRAMUSCULAR | Status: DC

## 2023-05-04 NOTE — Care Management Important Message (Signed)
 Important Message  Patient Details  Name: [name] [name] MRN: [id number] Date of Birth: 1950/08/13   Important Message Given:  Yes - Medicare IM     Dorena Bodo 05/04/2023, 3:31 PM

## 2023-05-04 NOTE — Progress Notes (Signed)
 PROGRESS NOTE    [name] [name]  ZOX:[id number] DOB: [birth year] DOA: 05/01/2023 PCP: Anabel Halon, MD   Brief Narrative:  [name] [name] is a 73 y.o. female with medical history significant of ambulatory dysfunction, left foot droop, obesity, history of breast cancer, T2DM, and ambulatory dysfunction who presented after a fall, landing on her R knee while trying to get into her family's truck with profound right lower extremity pain, imaging in the ED confirmed comminuted fracture of the distal femoral metadiaphysis with acutely displaced fibular neck fracture.  Assessment & Plan:   Principal Problem:   Right femoral fracture (HCC) Active Problems:   Essential hypertension   Type 2 diabetes mellitus with hyperlipidemia (HCC)   Thrombocytopenia (HCC)   Class 3 obesity   Malignant neoplasm of upper-outer quadrant of left breast in female, estrogen receptor positive (HCC)   Vertigo   Cirrhosis of liver without ascites (HCC)   Hypothyroidism   Right periprosthetic distal femur fracture Acute on chronic ambulatory dysfunction -Confirmed on imaging -Status post distal femoral plate 09/11/9145 -PT OT continue to follow -continue to recommend SNF - patient refusing SNF placement over the past 24 hours, unclear if she is safe and stable for discharge home given her nonweightbearing status and profound ambulatory dysfunction(acute on chronic)  AKI, resolved -Back to baseline, continue to advance diet as tolerated   Essential hypertension Hold losartan and carvedilol given borderline hypotension   Type 2 diabetes mellitus with hyperlipidemia  Chronic diabetic neuropathy A1c 6.5 SSI, Accu-Cheks, hypoglycemic protocol Continue with gabapentin    Chronic anemia/thrombocytopenia/2 chronic disease Likely 2/2 history of malignancy, on Aromasin, now with recent surgery Daily CBC, monitor closely   Malignant neoplasm of upper-outer quadrant of left breast in female, estrogen receptor  positive (HCC) Continue with aromasin  Outpatient oncology follow-up   Vertigo Follow up with PT and OT post surgical intervention    Cirrhosis of liver without ascites (HCC) Likely NASH, no signs of acute decompensation  Follow up as outpatient   Hypothyroidism Continue with levothyroxine    Class 3 obesity Body mass index is 46.86 kg/m.  DVT prophylaxis: enoxaparin (LOVENOX) injection 30 mg Start: 05/03/23 1800 SCDs Start: 05/01/23 1454 Code Status:   Code Status: Full Code Family Communication: Husband at bedside  Status is: Inpatient  Dispo: The patient is from: Home              Anticipated d/c is to: To be determined              Anticipated d/c date is: Imminent              Patient currently is medically stable for discharge  Consultants:  Orthopedic surgery  Procedures:  Right femoral plate 09/30/5619  Antimicrobials:  Preoperatively  Subjective: No acute issues or events overnight denies nausea vomiting diarrhea constipation headache fevers chills or chest pain.  Hip pain ongoing but improving.  Continues to be poorly motivated for therapy but has been refusing discharge to SNF despite nonambulatory status  Objective: Vitals:   05/03/23 1541 05/03/23 2012 05/03/23 2352 05/04/23 0459  BP: (!) 129/42 (!) 130/55 (!) 126/55 (!) 118/55  Pulse: (!) 105 (!) 102 95 91  Resp: 20 16 16 16   Temp: 98.5 F (36.9 C) 98.9 F (37.2 C) 99 F (37.2 C) 98.7 F (37.1 C)  TempSrc: Oral     SpO2: (!) 86% 91% 95% 96%  Weight:      Height:  Intake/Output Summary (Last 24 hours) at 05/04/2023 0734 Last data filed at 05/04/2023 0338 Gross per 24 hour  Intake 3562.33 ml  Output 250 ml  Net 3312.33 ml   Filed Weights   05/02/23 1556  Weight: 112.5 kg    Examination:  General:  Pleasantly resting in bed, No acute distress.  Somnolent but easily arousable Lungs: Diminished without overt wheezes rales or rhonchi  Heart: Distant heart sounds without murmur rub or  gallop Abdomen: Obese, soft, nontender Extremities: Without overt cyanosis clubbing or edema  Data Reviewed: I have personally reviewed following labs and imaging studies  CBC: Recent Labs  Lab 05/01/23 1336 05/01/23 1611 05/02/23 0740 05/03/23 0624 05/04/23 0512  WBC 3.2* 4.6 6.9 6.1 2.9*  NEUTROABS 2.2  --   --   --  1.8  HGB 11.8* 11.1* 10.9* 8.8* 7.7*  HCT 36.8 33.8* 35.2* 27.3* 23.8*  MCV 87.8 86.7 89.3 89.2 89.1  PLT 68* 73* 126* 70* 44*   Basic Metabolic Panel: Recent Labs  Lab 05/01/23 1336 05/01/23 1611 05/02/23 0740 05/03/23 0624 05/04/23 0512  NA 133*  --  135 134* 132*  K 3.9  --  4.7 4.6 4.4  CL 103  --  102 103 107  CO2 21*  --  24 22 20*  GLUCOSE 178*  --  172* 180* 171*  BUN 17  --  24* 36* 26*  CREATININE 0.76 0.80 1.72* 2.20* 1.05*  CALCIUM 9.3  --  9.6 8.9 8.6*   GFR: Estimated Creatinine Clearance: 56.3 mL/min (A) (by C-G formula based on SCr of 1.05 mg/dL (H)). Liver Function Tests: No results for input(s): "AST", "ALT", "ALKPHOS", "BILITOT", "PROT", "ALBUMIN" in the last 168 hours. No results for input(s): "LIPASE", "AMYLASE" in the last 168 hours. No results for input(s): "AMMONIA" in the last 168 hours. Coagulation Profile: No results for input(s): "INR", "PROTIME" in the last 168 hours. Cardiac Enzymes: No results for input(s): "CKTOTAL", "CKMB", "CKMBINDEX", "TROPONINI" in the last 168 hours. BNP (last 3 results) No results for input(s): "PROBNP" in the last 8760 hours. HbA1C: Recent Labs    05/01/23 1709  HGBA1C 6.5*   CBG: Recent Labs  Lab 05/03/23 0632 05/03/23 1114 05/03/23 1710 05/03/23 2132 05/04/23 0612  GLUCAP 176* 176* 162* 160* 154*   Lipid Profile: No results for input(s): "CHOL", "HDL", "LDLCALC", "TRIG", "CHOLHDL", "LDLDIRECT" in the last 72 hours. Thyroid Function Tests: No results for input(s): "TSH", "T4TOTAL", "FREET4", "T3FREE", "THYROIDAB" in the last 72 hours. Anemia Panel: No results for input(s):  "VITAMINB12", "FOLATE", "FERRITIN", "TIBC", "IRON", "RETICCTPCT" in the last 72 hours. Sepsis Labs: No results for input(s): "PROCALCITON", "LATICACIDVEN" in the last 168 hours.  Recent Results (from the past 240 hours)  Surgical pcr screen     Status: Abnormal   Collection Time: 05/02/23  5:21 AM   Specimen: Nasal Mucosa; Nasal Swab  Result Value Ref Range Status   MRSA, PCR NEGATIVE NEGATIVE Final   Staphylococcus aureus POSITIVE (A) NEGATIVE Final    Comment: (NOTE) The Xpert SA Assay (FDA approved for NASAL specimens in patients 63 years of age and older), is one component of a comprehensive surveillance program. It is not intended to diagnose infection nor to guide or monitor treatment. Performed at [facility], [address], [place], [place] [address]          Radiology Studies: US RENAL Result Date: 05/04/2023 CLINICAL DATA:  Acute kidney injury EXAM: RENAL / URINARY TRACT ULTRASOUND COMPLETE COMPARISON:  Renal  ultrasound 08/19/2022 FINDINGS: Right Kidney: Renal measurements: 10.0 x 5.0 x 5.2 cm = volume: 133 mL. Echogenicity within normal limits. No mass or hydronephrosis visualized. Left Kidney: Renal measurements: 10.1 x 5.0 x 4.5 cm = volume: 116 mL. Echogenicity within normal limits. No mass or hydronephrosis visualized. Bladder: Appears normal for degree of bladder distention. Other: None. IMPRESSION: Normal renal ultrasound. Electronically Signed   By: Minerva Fester M.D.   On: 05/04/2023 00:36   DG FEMUR, MIN 2 VIEWS RIGHT Result Date: 05/02/2023 CLINICAL DATA:  ORIF EXAM: RIGHT FEMUR 2 VIEWS COMPARISON:  05/01/2023 FINDINGS: Multiple intraoperative spot images demonstrate plate and screw fixation across the distal femoral fracture. No hardware complicating feature. Near anatomic alignment. IMPRESSION: Internal fixation.  No visible complicating feature. Electronically Signed   By: Charlett Nose M.D.   On: 05/02/2023 22:15   DG C-Arm 1-60 Min-No Report Result  Date: 05/02/2023 Fluoroscopy was utilized by the requesting physician.  No radiographic interpretation.   DG C-Arm 1-60 Min-No Report Result Date: 05/02/2023 Fluoroscopy was utilized by the requesting physician.  No radiographic interpretation.   Scheduled Meds:  buPROPion  150 mg Oral Daily   Chlorhexidine Gluconate Cloth  6 each Topical Daily   enoxaparin (LOVENOX) injection  30 mg Subcutaneous Q24H   exemestane  25 mg Oral QPC breakfast   insulin aspart  0-15 Units Subcutaneous TID WC   levothyroxine  150 mcg Oral Q0600   mirabegron ER  25 mg Oral Daily   mupirocin ointment  1 Application Nasal BID   pantoprazole  40 mg Oral Daily   Continuous Infusions:  sodium chloride 100 mL/hr at 05/04/23 0321     LOS: 3 days   Time spent:  Azucena Fallen, DO [facility]  If 7PM-7AM, please contact night-coverage [email or web address]  05/04/2023, 7:34 AM

## 2023-05-04 NOTE — Evaluation (Signed)
 Occupational Therapy Evaluation Patient Details Name: [name] [name] MRN: [id number] DOB: 1950-05-03 Today's Date: 05/04/2023   History of Present Illness   73 yo female presents to [facility] on 3/30 for R knee pain s/p fall getting into truck. Pt sustained R periprosthetic femoral fx, minimally displaced fibular neck fx. S/p ORIF distal femur fx on 3/31. PMH includes anxiety, L and R breast cancer s/p lumpectomy and radiation, DMII with neuropathy, gout, HTN, liver cirrhosis, R ankle fusion 2022, bilat TKR, R reverse TSA, obesity.     Clinical Impressions Pt admitted based on above, and was seen based on problem list below. PTA pt was living with spouse primarily at wheelchair level. She was receiving receiving min to mod assistance with LB ADLs and spouse completes IADLs. Today pt is requiring set up  to total +2 for ADLs. Bed mobility was total +2. Pt unable to maintain sit EOB, d/t crying in pain despite being premedicated for session. OT educated family on importance of post-acute rehab to promote a safe d/c home and to optimize independence levels. Recommendation of <3 hours of skilled rehab daily. OT will continue to follow acutely to maximize functional independence.        If plan is discharge home, recommend the following:   Two people to help with walking and/or transfers;Two people to help with bathing/dressing/bathroom;Assistance with cooking/housework;Help with stairs or ramp for entrance;Assist for transportation     Functional Status Assessment   Patient has had a recent decline in their functional status and demonstrates the ability to make significant improvements in function in a reasonable and predictable amount of time.     Equipment Recommendations   Other (comment) (Defer to next venue)     Recommendations for Other Services         Precautions/Restrictions   Precautions Precautions: Fall Restrictions Weight Bearing Restrictions Per Provider Order:  Yes RLE Weight Bearing Per Provider Order: Non weight bearing     Mobility Bed Mobility Overal bed mobility: Needs Assistance Bed Mobility: Rolling, Supine to Sit, Sit to Supine Rolling: Total assist, +2 for physical assistance   Supine to sit: Total assist, +2 for physical assistance Sit to supine: Total assist, +2 for physical assistance        Transfers Overall transfer level: Needs assistance                 General transfer comment: Not safe to attempt at this time      Balance Overall balance assessment: Needs assistance, History of Falls Sitting-balance support: Bilateral upper extremity supported, Feet supported Sitting balance-Leahy Scale: Zero Sitting balance - Comments: Pt reliant on mobility specialist to support trunk seated EOB Postural control: Left lateral lean         ADL either performed or assessed with clinical judgement   ADL Overall ADL's : Needs assistance/impaired Eating/Feeding: Set up;Bed level   Grooming: Set up;Bed level   Upper Body Bathing: Set up;Bed level   Lower Body Bathing: Total assistance;Bed level;+2 for physical assistance   Upper Body Dressing : Moderate assistance;Bed level Upper Body Dressing Details (indicate cue type and reason): pt unable to lift trunk off bed Lower Body Dressing: Total assistance;+2 for physical assistance;Bed level                 General ADL Comments: Not safe to complete functional transfers, pt severely limited by pain     Vision Baseline Vision/History: 0 No visual deficits Vision Assessment?: No apparent visual deficits  Pertinent Vitals/Pain Pain Assessment Pain Assessment: Faces Faces Pain Scale: Hurts whole lot Pain Location: RLE Pain Descriptors / Indicators: Crying, Moaning, Sore Pain Intervention(s): Limited activity within patient's tolerance, Repositioned, Premedicated before session     Extremity/Trunk Assessment Upper Extremity Assessment Upper  Extremity Assessment: Generalized weakness;LUE deficits/detail LUE Deficits / Details: decreased shoulder ROM LUE: Unable to fully assess due to pain LUE Sensation: WNL LUE Coordination: decreased gross motor   Lower Extremity Assessment Lower Extremity Assessment: Defer to PT evaluation   Cervical / Trunk Assessment Cervical / Trunk Assessment: Other exceptions Cervical / Trunk Exceptions: abdominal obesity   Communication Communication Communication: No apparent difficulties   Cognition Arousal: Alert Behavior During Therapy: Anxious Cognition: No apparent impairments         Following commands: Impaired Following commands impaired: Follows one step commands with increased time     Cueing  General Comments   Cueing Techniques: Verbal cues;Tactile cues;Visual cues  Pt family present during session           Home Living Family/patient expects to be discharged to:: Private residence Living Arrangements: Spouse/significant other Available Help at Discharge: Family;Available 24 hours/day Type of Home: House Home Access: Ramped entrance     Home Layout: One level     Bathroom Shower/Tub: Producer, television/film/video: Standard Bathroom Accessibility: Yes How Accessible: Accessible via wheelchair Home Equipment: Shower seat;Wheelchair - Electronics engineer (4 wheels);Rolling Walker (2 wheels)          Prior Functioning/Environment Prior Level of Function : Needs assist             Mobility Comments: mostly transfer-level to/from w/c, occasionally using rollator ADLs Comments: Assist for LB ADLs    OT Problem List: Decreased strength;Decreased range of motion;Decreased activity tolerance;Decreased safety awareness;Decreased knowledge of use of DME or AE;Pain   OT Treatment/Interventions: Self-care/ADL training;Therapeutic exercise;Therapeutic activities;DME and/or AE instruction;Patient/family education;Balance training       OT Goals(Current goals can be found in the care plan section)   Acute Rehab OT Goals Patient Stated Goal: To be in less pain OT Goal Formulation: With patient Time For Goal Achievement: 05/18/23 Potential to Achieve Goals: Fair   OT Frequency:  Min 1X/week       AM-PAC OT "6 Clicks" Daily Activity     Outcome Measure Help from another person eating meals?: A Little Help from another person taking care of personal grooming?: A Little Help from another person toileting, which includes using toliet, bedpan, or urinal?: Total Help from another person bathing (including washing, rinsing, drying)?: A Lot Help from another person to put on and taking off regular upper body clothing?: A Lot Help from another person to put on and taking off regular lower body clothing?: Total 6 Click Score: 12   End of Session Nurse Communication: Mobility status  Activity Tolerance: Patient limited by pain Patient left: in bed;with call bell/phone within reach;with bed alarm set;with family/visitor present  OT Visit Diagnosis: Unsteadiness on feet (R26.81);Other abnormalities of gait and mobility (R26.89);Muscle weakness (generalized) (M62.81);Repeated falls (R29.6)                Time: 1610-9604 OT Time Calculation (min): 45 min Charges:  OT General Charges $OT Visit: 1 Visit OT Evaluation $OT Eval Moderate Complexity: 1 Mod OT Treatments $Self Care/Home Management : 23-37 mins  Ivor Messier, OT  Acute Rehabilitation Services Office [phone] Secure chat preferred   [name] [name] 05/04/2023, 3:22 PM

## 2023-05-04 NOTE — Plan of Care (Signed)
  Problem: Clinical Measurements: Goal: Ability to maintain clinical measurements within normal limits will improve Outcome: Progressing Goal: Will remain free from infection Outcome: Progressing Goal: Diagnostic test results will improve Outcome: Progressing Goal: Respiratory complications will improve Outcome: Progressing Goal: Cardiovascular complication will be avoided Outcome: Progressing   Problem: Nutrition: Goal: Adequate nutrition will be maintained Outcome: Progressing   Problem: Elimination: Goal: Will not experience complications related to bowel motility Outcome: Progressing Goal: Will not experience complications related to urinary retention Outcome: Progressing   Problem: Pain Managment: Goal: General experience of comfort will improve and/or be controlled Outcome: Progressing   Problem: Safety: Goal: Ability to remain free from injury will improve Outcome: Progressing   Problem: Fluid Volume: Goal: Ability to maintain a balanced intake and output will improve Outcome: Progressing   Problem: Health Behavior/Discharge Planning: Goal: Ability to manage health-related needs will improve Outcome: Progressing   Problem: Metabolic: Goal: Ability to maintain appropriate glucose levels will improve Outcome: Progressing   Problem: Nutritional: Goal: Maintenance of adequate nutrition will improve Outcome: Progressing   Problem: Tissue Perfusion: Goal: Adequacy of tissue perfusion will improve Outcome: Progressing   Problem: Metabolic: Goal: Ability to maintain appropriate glucose levels will improve Outcome: Progressing   Problem: Nutritional: Goal: Maintenance of adequate nutrition will improve Outcome: Progressing   Problem: Clinical Measurements: Goal: Postoperative complications will be avoided or minimized Outcome: Progressing   Problem: Self-Concept: Goal: Ability to maintain and perform role responsibilities to the fullest extent possible  will improve Outcome: Progressing   Problem: Pain Management: Goal: Pain level will decrease Outcome: Progressing

## 2023-05-04 NOTE — Progress Notes (Signed)
   Subjective:  Improved pain today.  Was able to get to side of bed.  Objective:   VITALS:   Vitals:   05/03/23 1541 05/03/23 2012 05/03/23 2352 05/04/23 0459  BP: (!) 129/42 (!) 130/55 (!) 126/55 (!) 118/55  Pulse: (!) 105 (!) 102 95 91  Resp: 20 16 16 16   Temp: 98.5 F (36.9 C) 98.9 F (37.2 C) 99 F (37.2 C) 98.7 F (37.1 C)  TempSrc: Oral     SpO2: (!) 86% 91% 95% 96%  Weight:      Height:        Neurologically intact Neurovascular intact  Dressing CDI, wwp toes  Lab Results  Component Value Date   WBC 6.1 05/03/2023   HGB 8.8 (L) 05/03/2023   HCT 27.3 (L) 05/03/2023   MCV 89.2 05/03/2023   PLT 70 (L) 05/03/2023     Assessment/Plan:  2 Days Post-Op right leg distal femoral plating  - Expected postop acute blood loss anemia - will monitor for symptoms - Patient to work with PT to optimize mobilization safely - DVT ppx - SCDs, ambulation, Lovenox - Postoperative Abx: Ancef x 2 additional doses given - NWB operative extremity - Pain control - multimodal pain management, ATC acetaminophen in conjunction with as needed narcotic (oxycodone), although this should be minimized with other modalities   Haden Suder 05/04/2023, 6:48 AM

## 2023-05-04 NOTE — Plan of Care (Signed)
  Problem: Education: Goal: Knowledge of General Education information will improve Description: Including pain rating scale, medication(s)/side effects and non-pharmacologic comfort measures Outcome: Progressing   Problem: Clinical Measurements: Goal: Ability to maintain clinical measurements within normal limits will improve Outcome: Progressing Goal: Will remain free from infection Outcome: Progressing Goal: Diagnostic test results will improve Outcome: Progressing Goal: Respiratory complications will improve Outcome: Progressing Goal: Cardiovascular complication will be avoided Outcome: Progressing   Problem: Nutrition: Goal: Adequate nutrition will be maintained Outcome: Progressing   Problem: Pain Managment: Goal: General experience of comfort will improve and/or be controlled Outcome: Progressing   Problem: Safety: Goal: Ability to remain free from injury will improve Outcome: Progressing   Problem: Skin Integrity: Goal: Risk for impaired skin integrity will decrease Outcome: Progressing   Problem: Fluid Volume: Goal: Ability to maintain a balanced intake and output will improve Outcome: Progressing   Problem: Metabolic: Goal: Ability to maintain appropriate glucose levels will improve Outcome: Progressing   Problem: Nutritional: Goal: Maintenance of adequate nutrition will improve Outcome: Progressing   Problem: Tissue Perfusion: Goal: Adequacy of tissue perfusion will improve Outcome: Progressing   Problem: Metabolic: Goal: Ability to maintain appropriate glucose levels will improve Outcome: Progressing   Problem: Skin Integrity: Goal: Risk for impaired skin integrity will decrease Outcome: Progressing   Problem: Tissue Perfusion: Goal: Adequacy of tissue perfusion will improve Outcome: Progressing   Problem: Activity: Goal: Ability to ambulate and perform ADLs will improve Outcome: Progressing   Problem: Clinical Measurements: Goal:  Postoperative complications will be avoided or minimized Outcome: Progressing   Problem: Pain Management: Goal: Pain level will decrease Outcome: Progressing

## 2023-05-04 NOTE — TOC Progression Note (Addendum)
 Transition of Care [facility]) - Progression Note    Patient Details  Name: [name] [name] MRN: [id number] Date of Birth: 1950/07/13  Transition of [facility]) CM/SW Contact  Epifanio Lesches, RN Phone Number: 05/04/2023, 3:23 PM  Clinical Narrative:    Daughter Tresa Endo informed NCM that they are having a family discussion regarding pt 's next level of care, home with home health services vs SNF/[facility]. Daughter states pt's husband is having knee surgery on next week and will not be able to assist with care if pt d/c to home. Daughter states pt needs to go to SNF and will f/u with [facility] team on tomorrow with answer.   Expected Discharge Plan: Home w Home Health Services Barriers to Discharge: Continued Medical Work up (OR 05/02/23 @ 1631)  Expected Discharge Plan and Services       Living arrangements for the past 2 months: Single Family Home                                       Social Determinants of Health (SDOH) Interventions SDOH Screenings   Food Insecurity: No Food Insecurity (05/01/2023)  Housing: Low Risk  (05/01/2023)  Transportation Needs: No Transportation Needs (05/01/2023)  Utilities: Not At Risk (05/01/2023)  Alcohol Screen: Low Risk  (03/16/2023)  Depression (PHQ2-9): Low Risk  (03/16/2023)  Recent Concern: Depression (PHQ2-9) - Medium Risk (12/29/2022)  Financial Resource Strain: Low Risk  (03/16/2023)  Physical Activity: Sufficiently Active (03/16/2023)  Social Connections: Moderately Integrated (05/01/2023)  Stress: No Stress Concern Present (03/16/2023)  Tobacco Use: Low Risk  (05/02/2023)  Health Literacy: Adequate Health Literacy (03/16/2023)    Readmission Risk Interventions     No data to display

## 2023-05-05 DIAGNOSIS — S72451A Displaced supracondylar fracture without intracondylar extension of lower end of right femur, initial encounter for closed fracture: Secondary | ICD-10-CM | POA: Diagnosis not present

## 2023-05-05 DIAGNOSIS — E1169 Type 2 diabetes mellitus with other specified complication: Secondary | ICD-10-CM | POA: Diagnosis not present

## 2023-05-05 DIAGNOSIS — C50412 Malignant neoplasm of upper-outer quadrant of left female breast: Secondary | ICD-10-CM | POA: Diagnosis not present

## 2023-05-05 DIAGNOSIS — I10 Essential (primary) hypertension: Secondary | ICD-10-CM | POA: Diagnosis not present

## 2023-05-05 LAB — CBC
HCT: 25.7 % — ABNORMAL LOW (ref 36.0–46.0)
Hemoglobin: 8.2 g/dL — ABNORMAL LOW (ref 12.0–15.0)
MCH: 28.5 pg (ref 26.0–34.0)
MCHC: 31.9 g/dL (ref 30.0–36.0)
MCV: 89.2 fL (ref 80.0–100.0)
Platelets: 44 10*3/uL — ABNORMAL LOW (ref 150–400)
RBC: 2.88 MIL/uL — ABNORMAL LOW (ref 3.87–5.11)
RDW: 15.3 % (ref 11.5–15.5)
WBC: 2 10*3/uL — ABNORMAL LOW (ref 4.0–10.5)
nRBC: 0 % (ref 0.0–0.2)

## 2023-05-05 LAB — GLUCOSE, CAPILLARY
Glucose-Capillary: 145 mg/dL — ABNORMAL HIGH (ref 70–99)
Glucose-Capillary: 240 mg/dL — ABNORMAL HIGH (ref 70–99)
Glucose-Capillary: 212 mg/dL — ABNORMAL HIGH (ref 70–99)

## 2023-05-05 MED ORDER — ENOXAPARIN SODIUM 40 MG/0.4ML IJ SOSY: 40.0000 mg | PREFILLED_SYRINGE | INTRAMUSCULAR | Status: DC

## 2023-05-05 NOTE — Progress Notes (Addendum)
 PROGRESS NOTE    [name] [name]  WUJ:[id number] DOB: [birth year] DOA: 05/01/2023 PCP: Anabel Halon, MD   Brief Narrative:  [name] [name] is a 73 y.o. female with medical history significant of ambulatory dysfunction, left foot droop, obesity, history of breast cancer, T2DM, and ambulatory dysfunction who presented after a fall, landing on her R knee while trying to get into her family's truck with profound right lower extremity pain, imaging in the ED confirmed comminuted fracture of the distal femoral metadiaphysis with acutely displaced fibular neck fracture.  Patient tolerated surgical repair 05/02/2023 quite well, continues to work with therapy, medically stable for discharge -awaiting insurance approval/bed approval.  Assessment & Plan:   Principal Problem:   Right femoral fracture (HCC) Active Problems:   Essential hypertension   Type 2 diabetes mellitus with hyperlipidemia (HCC)   Thrombocytopenia (HCC)   Class 3 obesity   Malignant neoplasm of upper-outer quadrant of left breast in female, estrogen receptor positive (HCC)   Vertigo   Cirrhosis of liver without ascites (HCC)   Hypothyroidism   Right periprosthetic distal femur fracture Acute on chronic ambulatory dysfunction -Confirmed on imaging -Status post distal femoral plate 9/56/2130 -PT OT continue to follow -continue to recommend SNF - patient now agreeable for SNF placement after previously refusing. Remains medically stable for discharge.  AKI, resolved -Back to baseline, continue to advance diet as tolerated   Essential hypertension Hold losartan and carvedilol given borderline hypotension   Type 2 diabetes mellitus with hyperlipidemia  Chronic diabetic neuropathy A1c 6.5 SSI, Accu-Cheks, hypoglycemic protocol Continue with gabapentin    Chronic anemia of chronic disease Thrombocytopenia, acute on chronic Likely 2/2 history of malignancy, on Aromasin, now with recent surgery Daily CBC, monitor  closely Hold anticoagulation in the setting of low plt   Malignant neoplasm of upper-outer quadrant of left breast in female, estrogen receptor positive (HCC) Continue with aromasin  Outpatient oncology follow-up   Vertigo Follow up with PT and OT post surgical intervention    Cirrhosis of liver without ascites (HCC) Likely NASH, no signs of acute decompensation  Follow up as outpatient   Hypothyroidism Continue with levothyroxine    Class 3 obesity Body mass index is 46.86 kg/m.  DVT prophylaxis: SCDs Start: 05/01/23 1454 Code Status:   Code Status: Full Code Family Communication: Husband at bedside  Status is: Inpatient  Dispo: The patient is from: Home              Anticipated d/c is to: To be determined              Anticipated d/c date is: Imminent              Patient currently is medically stable for discharge  Consultants:  Orthopedic surgery  Procedures:  Right femoral plate 8/65/7846  Antimicrobials:  Preoperatively  Subjective: No acute issues or events overnight denies nausea vomiting diarrhea constipation headache fevers chills or chest pain.  Hip pain ongoing but improving.  Continues to be poorly motivated for therapy but has been refusing discharge to SNF despite nonambulatory status  Objective: Vitals:   05/04/23 1514 05/04/23 2046 05/05/23 0345 05/05/23 0853  BP: (!) 140/68 (!) 149/68 121/65 (!) 150/67  Pulse: (!) 102 (!) 103 95 95  Resp: 17 19 18 16   Temp: 99.1 F (37.3 C) 100 F (37.8 C) 98.9 F (37.2 C) 98.2 F (36.8 C)  TempSrc:   Oral Oral  SpO2: 92% 96% 97% 100%  Weight:  Height:        Intake/Output Summary (Last 24 hours) at 05/05/2023 1000 Last data filed at 05/05/2023 8295 Gross per 24 hour  Intake 360 ml  Output 2800 ml  Net -2440 ml   Filed Weights   05/02/23 1556  Weight: 112.5 kg    Examination:  General:  Pleasantly resting in bed, No acute distress.  Somnolent but easily arousable Lungs: Diminished without  overt wheezes rales or rhonchi  Heart: Distant heart sounds without murmur rub or gallop Abdomen: Obese, soft, nontender Extremities: Without overt cyanosis clubbing or edema  Data Reviewed: I have personally reviewed following labs and imaging studies  CBC: Recent Labs  Lab 05/01/23 1336 05/01/23 1611 05/02/23 0740 05/03/23 0624 05/04/23 0512 05/05/23 0607  WBC 3.2* 4.6 6.9 6.1 2.9* 2.0*  NEUTROABS 2.2  --   --   --  1.8  --   HGB 11.8* 11.1* 10.9* 8.8* 7.7* 8.2*  HCT 36.8 33.8* 35.2* 27.3* 23.8* 25.7*  MCV 87.8 86.7 89.3 89.2 89.1 89.2  PLT 68* 73* 126* 70* 44* 44*   Basic Metabolic Panel: Recent Labs  Lab 05/01/23 1336 05/01/23 1611 05/02/23 0740 05/03/23 0624 05/04/23 0512  NA 133*  --  135 134* 132*  K 3.9  --  4.7 4.6 4.4  CL 103  --  102 103 107  CO2 21*  --  24 22 20*  GLUCOSE 178*  --  172* 180* 171*  BUN 17  --  24* 36* 26*  CREATININE 0.76 0.80 1.72* 2.20* 1.05*  CALCIUM 9.3  --  9.6 8.9 8.6*   GFR: Estimated Creatinine Clearance: 56.3 mL/min (A) (by C-G formula based on SCr of 1.05 mg/dL (H)). Liver Function Tests: No results for input(s): "AST", "ALT", "ALKPHOS", "BILITOT", "PROT", "ALBUMIN" in the last 168 hours. No results for input(s): "LIPASE", "AMYLASE" in the last 168 hours. No results for input(s): "AMMONIA" in the last 168 hours. Coagulation Profile: No results for input(s): "INR", "PROTIME" in the last 168 hours. Cardiac Enzymes: No results for input(s): "CKTOTAL", "CKMB", "CKMBINDEX", "TROPONINI" in the last 168 hours. BNP (last 3 results) No results for input(s): "PROBNP" in the last 8760 hours. HbA1C: No results for input(s): "HGBA1C" in the last 72 hours.  CBG: Recent Labs  Lab 05/04/23 0612 05/04/23 1122 05/04/23 1630 05/04/23 2130 05/05/23 0630  GLUCAP 154* 216* 137* 143* 145*   Lipid Profile: No results for input(s): "CHOL", "HDL", "LDLCALC", "TRIG", "CHOLHDL", "LDLDIRECT" in the last 72 hours. Thyroid Function Tests: No  results for input(s): "TSH", "T4TOTAL", "FREET4", "T3FREE", "THYROIDAB" in the last 72 hours. Anemia Panel: No results for input(s): "VITAMINB12", "FOLATE", "FERRITIN", "TIBC", "IRON", "RETICCTPCT" in the last 72 hours. Sepsis Labs: No results for input(s): "PROCALCITON", "LATICACIDVEN" in the last 168 hours.  Recent Results (from the past 240 hours)  Surgical pcr screen     Status: Abnormal   Collection Time: 05/02/23  5:21 AM   Specimen: Nasal Mucosa; Nasal Swab  Result Value Ref Range Status   MRSA, PCR NEGATIVE NEGATIVE Final   Staphylococcus aureus POSITIVE (A) NEGATIVE Final    Comment: (NOTE) The Xpert SA Assay (FDA approved for NASAL specimens in patients 74 years of age and older), is one component of a comprehensive surveillance program. It is not intended to diagnose infection nor to guide or monitor treatment. Performed at [facility], [address], [place], [place] [address]          Radiology Studies: US RENAL Result Date:  05/04/2023 CLINICAL DATA:  Acute kidney injury EXAM: RENAL / URINARY TRACT ULTRASOUND COMPLETE COMPARISON:  Renal ultrasound 08/19/2022 FINDINGS: Right Kidney: Renal measurements: 10.0 x 5.0 x 5.2 cm = volume: 133 mL. Echogenicity within normal limits. No mass or hydronephrosis visualized. Left Kidney: Renal measurements: 10.1 x 5.0 x 4.5 cm = volume: 116 mL. Echogenicity within normal limits. No mass or hydronephrosis visualized. Bladder: Appears normal for degree of bladder distention. Other: None. IMPRESSION: Normal renal ultrasound. Electronically Signed   By: Minerva Fester M.D.   On: 05/04/2023 00:36   Scheduled Meds:  buPROPion  150 mg Oral Daily   Chlorhexidine Gluconate Cloth  6 each Topical Daily   exemestane  25 mg Oral QPC breakfast   gabapentin  1,200 mg Oral QHS   gabapentin  600 mg Oral BID   insulin aspart  0-15 Units Subcutaneous TID WC   levothyroxine  150 mcg Oral Q0600   mirabegron ER  25 mg Oral Daily   mupirocin  ointment  1 Application Nasal BID   mycophenolate  1,500 mg Oral BID   pantoprazole  40 mg Oral Daily   Continuous Infusions:     LOS: 4 days   Time spent:  Azucena Fallen, DO [facility]  If 7PM-7AM, please contact night-coverage [email or web address]  05/05/2023, 10:00 AM

## 2023-05-05 NOTE — [place]2 (Signed)
 [place] MEDICA[place] FL2 LEVEL OF CARE FORM     IDENTIFICATION  Patient Name: [name] [name] Birthdate: [birth year] Sex: female Admission Date (Current Location): 05/01/2023  [facility] and [place] Number:  Producer, television/film/video and Address:  [facility], [address] [place], [place] [address]      Provider Number: [id number]  Attending Physician Name and Address:  Azucena Fallen, MD  Relative Name and Phone Number:  [place], Borski Spouse [phone]    Current Level of Care: Hospital Recommended Level of Care: Skilled Nursing Facility Prior Approval Number:    Date Approved/Denied:   PASRR Number:    Discharge Plan: SNF    Current Diagnoses: Patient Active Problem List   Diagnosis Date Noted   Closed left femoral fracture (HCC) 05/01/2023   Right femoral fracture (HCC) 05/01/2023   Hypothyroidism 05/01/2023   Physical deconditioning 03/30/2023   Fall 11/25/2022   COVID-19 virus infection 09/17/2022   Immunosuppressed status (HCC) 09/17/2022   Abnormal CT of the abdomen 08/09/2022   Paraumbilical hernia 07/29/2022   Esophageal varices in cirrhosis (HCC) 07/29/2022   Cirrhosis of liver without ascites (HCC) 07/12/2022   LUQ abdominal pain 07/05/2022   Drug-induced constipation 07/05/2022   Anxiety disorder 08/28/2021   Disorder of intervertebral disc of cervical spine 08/28/2021   Family history of malignant neoplasm of digestive organs 08/28/2021   Malignant neoplasm of right breast (HCC) 07/08/2021   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   Pseudophakia of both eyes 04/01/2021   Retinal edema 04/01/2021   Trichiasis of eyelid of both eyes 04/01/2021   Hypertrophy of inferior nasal turbinate 02/26/2021   Allergic rhinitis due to pollen 01/29/2021   Allergy to influenza vaccine 01/29/2021   Gout 01/29/2021   Hyperlipidemia 01/29/2021   Nonalcoholic steatohepatitis (NASH) 01/29/2021   Primary insomnia 01/29/2021   Recurrent major  depression in remission (HCC) 01/29/2021   Arthritis of right ankle 04/14/2020   Nasal septal deviation 03/11/2020   Arthritis of right subtalar joint 10/09/2019   Acquired pes planovalgus of right foot 10/09/2019   Acquired posterior equinus of right lower extremity 10/09/2019   Epistaxis, recurrent 09/26/2019   OSA on CPAP 09/26/2019   Acquired hypothyroidism 05/21/2019   Arthritis of shoulder 09/07/2018   Bilateral hearing loss 08/01/2018   Vertigo 08/01/2018   Chronic nonintractable headache 08/01/2018   Type 2 diabetes mellitus with hyperlipidemia (HCC) 11/16/2016   Bilateral lower extremity edema 09/01/2016   Diabetic polyneuropathy associated with type 2 diabetes mellitus (HCC) 06/03/2016   Malignant neoplasm of upper-outer quadrant of left breast in female, estrogen receptor positive (HCC) 08/19/2015   Osteoarthritis of right knee 02/28/2015   Status post total right knee replacement 02/28/2015   Splenomegaly 07/30/2014   Thrombocytopenia (HCC) 07/30/2014   Recurrent falls 01/12/2013   Class 3 obesity 01/12/2013   Degenerative arthritis of spine 01/12/2013   Essential hypertension 09/05/2012    Orientation RESPIRATION BLADDER Height & Weight     Self, Time, Situation, Place  O2 External catheter, Incontinent Weight: 248 lb (112.5 kg) Height:  5\' 1"  (154.9 cm)  BEHAVIORAL SYMPTOMS/MOOD NEUROLOGICAL BOWEL NUTRITION STATUS      Continent Diet (see discharge summary)  AMBULATORY STATUS COMMUNICATION OF NEEDS Skin   Total Care Verbally Surgical wounds, PU Stage and Appropriate Care                       Personal Care Assistance Level of Assistance  Bathing, Feeding, Dressing, Total  care Bathing Assistance: Maximum assistance Feeding assistance: Limited assistance Dressing Assistance: Maximum assistance Total Care Assistance: Maximum assistance   Functional Limitations Info  Sight, Hearing, Speech Sight Info: Adequate Hearing Info: Adequate Speech Info: Adequate     SPECIAL CARE FACTORS FREQUENCY  PT (By licensed PT), OT (By licensed OT)     PT Frequency: 5x week OT Frequency: 5x week            Contractures Contractures Info: Not present    Additional Factors Info  Code Status, Allergies, Insulin Sliding Scale Code Status Info: full Allergies Info: Codeine, Influenza A (H1n1) Monoval Pf, Hydrocodone-acetaminophen, Lisinopril, Metformin, Pregabalin, Jardiance (Empagliflozin)   Insulin Sliding Scale Info: novolog: see discharge summary       Current Medications (05/05/2023):  This is the current hospital active medication list Current Facility-Administered Medications  Medication Dose Route Frequency Provider Last Rate Last Admin   acetaminophen (TYLENOL) tablet 650 mg  650 mg Oral Q6H PRN Huel Cote, MD   650 mg at 05/04/23 2058   Or   acetaminophen (TYLENOL) suppository 650 mg  650 mg Rectal Q6H PRN Huel Cote, MD       buPROPion (WELLBUTRIN XL) 24 hr tablet 150 mg  150 mg Oral Daily Huel Cote, MD   150 mg at 05/05/23 2130   Chlorhexidine Gluconate Cloth 2 % PADS 6 each  6 each Topical Daily Huel Cote, MD   6 each at 05/04/23 1001   cyclobenzaprine (FLEXERIL) tablet 5 mg  5 mg Oral TID PRN Huel Cote, MD   5 mg at 05/04/23 0502   exemestane (AROMASIN) tablet 25 mg  25 mg Oral QPC breakfast Huel Cote, MD   25 mg at 05/05/23 0934   gabapentin (NEURONTIN) capsule 1,200 mg  1,200 mg Oral QHS Azucena Fallen, MD   1,200 mg at 05/04/23 2054   gabapentin (NEURONTIN) capsule 600 mg  600 mg Oral BID Azucena Fallen, MD   600 mg at 05/05/23 8657   HYDROmorphone (DILAUDID) injection 1 mg  1 mg Intravenous Q2H PRN Huel Cote, MD   1 mg at 05/04/23 2004   insulin aspart (novoLOG) injection 0-15 Units  0-15 Units Subcutaneous TID WC Huel Cote, MD   2 Units at 05/05/23 8469   levothyroxine (SYNTHROID) tablet 150 mcg  150 mcg Oral Q0600 Huel Cote, MD   150 mcg at 05/05/23 6295   meclizine  (ANTIVERT) tablet 25 mg  25 mg Oral TID PRN Huel Cote, MD       mirabegron ER [facility]) tablet 25 mg  25 mg Oral Daily Huel Cote, MD   25 mg at 05/05/23 0933   mupirocin ointment (BACTROBAN) 2 % 1 Application  1 Application Nasal BID Huel Cote, MD   1 Application at 05/05/23 2841   mycophenolate (CELLCEPT) capsule 1,500 mg  1,500 mg Oral BID Azucena Fallen, MD   1,500 mg at 05/05/23 0934   ondansetron (ZOFRAN) tablet 4 mg  4 mg Oral Q6H PRN Huel Cote, MD       Or   ondansetron [facility]) injection 4 mg  4 mg Intravenous Q6H PRN Huel Cote, MD   4 mg at 05/02/23 2020   oxyCODONE (Oxy IR/ROXICODONE) immediate release tablet 5 mg  5 mg Oral Q4H PRN Huel Cote, MD   5 mg at 05/05/23 0611   pantoprazole (PROTONIX) EC tablet 40 mg  40 mg Oral Daily Huel Cote, MD   40 mg at 05/05/23 [id number]  Discharge Medications: Please see discharge summary for a list of discharge medications.  Relevant Imaging Results:  Relevant Lab Results:   Additional Information SSN: [id number]  Lorri Frederick, LCSW

## 2023-05-05 NOTE — Progress Notes (Signed)
 RE:   [name] [name]      Date of Birth: [birth year]      Date:   05/05/23       To Whom It Busch Concern:  Please be advised that the above-named patient will require a short-term nursing home stay - anticipated 30 days or less for rehabilitation and strengthening.  The plan is for return home.                 MD signature                Date

## 2023-05-05 NOTE — TOC Progression Note (Addendum)
 Transition of Care [facility]) - Progression Note    Patient Details  Name: [name] [name] MRN: [id number] Date of Birth: March 01, 1950  [facility]) CM/SW Contact  Lorri Frederick, LCSW Phone Number: 05/05/2023, 11:33 AM  Clinical Narrative:   CSW spoke with pt and husband regarding consent to send out referral for SNF.  They are continuing to discuss SNF vs HH, we reviewed PT note and how much assistance she is needing.  Husband was scheduled for surgery for knee replacement next week, but has not postponed this.  After much discussion, pt does consent for CSW to send out referral in hub.  Medicare choice document provided.  Pt is interested in [facility].  CSW sent out referral in hub, reached out to [facility] to review.   1400: bed offers provided to pt, husband, daughter Tresa Endo now in room.  Expected Discharge Plan: Home w Home Health Services Barriers to Discharge: Continued Medical Work up (OR 05/02/23 @ 1631)  Expected Discharge Plan and Services       Living arrangements for the past 2 months: Single Family Home                                       Social Determinants of Health (SDOH) Interventions SDOH Screenings   Food Insecurity: No Food Insecurity (05/01/2023)  Housing: Low Risk  (05/01/2023)  Transportation Needs: No Transportation Needs (05/01/2023)  Utilities: Not At Risk (05/01/2023)  Alcohol Screen: Low Risk  (03/16/2023)  Depression (PHQ2-9): Low Risk  (03/16/2023)  Recent Concern: Depression (PHQ2-9) - Medium Risk (12/29/2022)  Financial Resource Strain: Low Risk  (03/16/2023)  Physical Activity: Sufficiently Active (03/16/2023)  Social Connections: Moderately Integrated (05/01/2023)  Stress: No Stress Concern Present (03/16/2023)  Tobacco Use: Low Risk  (05/02/2023)  Health Literacy: Adequate Health Literacy (03/16/2023)    Readmission Risk Interventions     No data to display

## 2023-05-05 NOTE — Progress Notes (Signed)
 Physical Therapy Treatment Patient Details Name: [name] [name] MRN: [id number] DOB: [birth year] Today's Date: 05/05/2023   History of Present Illness 73 yo female presents [facility] on 3/30 for R knee pain s/p fall getting into truck. Pt sustained R periprosthetic femoral fx, minimally displaced fibular neck fx. S/p ORIF distal femur fx on 3/31. PMH includes anxiety, L and R breast cancer s/p lumpectomy and radiation, DMII with neuropathy, gout, HTN, liver cirrhosis, R ankle fusion 2022, bilat TKR, R reverse TSA, obesity.    PT Comments  Pt endorsing significant RLE pain this date, but is agreeable to progress mobility. Pt continuing to require significant assist, max-total +2, for to/from EOB. Once EOB, pt able to sit unsupported and tolerate dynamic challenges (scooting, LE exercise). Pt unable to progress to standing today given pain, will continue to progress as able.    If plan is discharge home, recommend the following: Two people to help with walking and/or transfers;Two people to help with bathing/dressing/bathroom   Can travel by private vehicle     No  Equipment Recommendations  Hoyer lift    Recommendations for Other Services       Precautions / Restrictions Precautions Precautions: Fall Restrictions Weight Bearing Restrictions Per Provider Order: Yes RLE Weight Bearing Per Provider Order: Non weight bearing     Mobility  Bed Mobility Overal bed mobility: Needs Assistance Bed Mobility: Supine to Sit, Sit to Supine, Rolling Rolling: Max assist, Used rails   Supine to sit: Total assist, +2 for physical assistance Sit to supine: Total assist, +2 for physical assistance, Independent   General bed mobility comments: assist for all aspects, use of bedrails and requires significant LE and truncal assist    Transfers                   General transfer comment: nt    Ambulation/Gait                   Stairs             Wheelchair Mobility      Tilt Bed    Modified Rankin (Stroke Patients Only)       Balance Overall balance assessment: Needs assistance, History of Falls Sitting-balance support: Bilateral upper extremity supported, Feet supported Sitting balance-Leahy Scale: Fair Sitting balance - Comments: can sit EOB unsupported once steady Postural control: Posterior lean                                  Communication Communication Communication: No apparent difficulties  Cognition Arousal: Alert Behavior During Therapy: Anxious   PT - Cognitive impairments: No apparent impairments                         Following commands: Impaired Following commands impaired: Follows one step commands with increased time    Cueing Cueing Techniques: Verbal cues, Tactile cues, Visual cues  Exercises General Exercises - Lower Extremity Short Arc Quad: AAROM, Right, 5 reps, Seated Heel Slides: AAROM, Right, 5 reps, Supine    General Comments        Pertinent Vitals/Pain Pain Assessment Pain Assessment: Faces Faces Pain Scale: Hurts even more Pain Location: RLE Pain Descriptors / Indicators: Crying, Moaning, Sore Pain Intervention(s): Limited activity within patient's tolerance, Monitored during session, Repositioned    Home Living  Prior Function            PT Goals (current goals can now be found in the care plan section) Acute Rehab PT Goals PT Goal Formulation: With patient/family Time For Goal Achievement: 05/17/23 Potential to Achieve Goals: Fair Progress towards PT goals: Progressing toward goals    Frequency    Min 2X/week      PT Plan      Co-evaluation              AM-PAC PT "6 Clicks" Mobility   Outcome Measure  Help needed turning from your back to your side while in a flat bed without using bedrails?: Total Help needed moving from lying on your back to sitting on the side of a flat bed without using bedrails?:  Total Help needed moving to and from a bed to a chair (including a wheelchair)?: Total Help needed standing up from a chair using your arms (e.g., wheelchair or bedside chair)?: Total Help needed to walk in hospital room?: Total Help needed climbing 3-5 steps with a railing? : Total 6 Click Score: 6    End of Session Equipment Utilized During Treatment: Oxygen Activity Tolerance: Patient limited by pain;Patient limited by fatigue Patient left: in bed;with call bell/phone within reach;with bed alarm set;with family/visitor present Nurse Communication: Mobility status;Weight bearing status;Precautions PT Visit Diagnosis: Other abnormalities of gait and mobility (R26.89);Muscle weakness (generalized) (M62.81)     Time: 9563-8756 PT Time Calculation (min) (ACUTE ONLY): 17 min  Charges:    $Therapeutic Activity: 8-22 mins PT General Charges $$ ACUTE PT VISIT: 1 Visit                     Marye Round, PT DPT Acute Rehabilitation Services Secure Chat Preferred  Office [phone]    Truddie Coco 05/05/2023, 4:38 PM

## 2023-05-05 NOTE — Plan of Care (Signed)

## 2023-05-06 DIAGNOSIS — F419 Anxiety disorder, unspecified: Secondary | ICD-10-CM | POA: Diagnosis not present

## 2023-05-06 DIAGNOSIS — R296 Repeated falls: Secondary | ICD-10-CM | POA: Diagnosis not present

## 2023-05-06 DIAGNOSIS — R42 Dizziness and giddiness: Secondary | ICD-10-CM | POA: Diagnosis not present

## 2023-05-06 DIAGNOSIS — G4733 Obstructive sleep apnea (adult) (pediatric): Secondary | ICD-10-CM | POA: Diagnosis not present

## 2023-05-06 DIAGNOSIS — E1169 Type 2 diabetes mellitus with other specified complication: Secondary | ICD-10-CM | POA: Diagnosis not present

## 2023-05-06 DIAGNOSIS — S7292XD Unspecified fracture of left femur, subsequent encounter for closed fracture with routine healing: Secondary | ICD-10-CM | POA: Diagnosis not present

## 2023-05-06 DIAGNOSIS — Z170 Estrogen receptor positive status [ER+]: Secondary | ICD-10-CM | POA: Diagnosis not present

## 2023-05-06 DIAGNOSIS — E785 Hyperlipidemia, unspecified: Secondary | ICD-10-CM | POA: Diagnosis not present

## 2023-05-06 DIAGNOSIS — S72451A Displaced supracondylar fracture without intracondylar extension of lower end of right femur, initial encounter for closed fracture: Secondary | ICD-10-CM | POA: Diagnosis not present

## 2023-05-06 DIAGNOSIS — F3340 Major depressive disorder, recurrent, in remission, unspecified: Secondary | ICD-10-CM | POA: Diagnosis not present

## 2023-05-06 DIAGNOSIS — Z9889 Other specified postprocedural states: Secondary | ICD-10-CM | POA: Diagnosis not present

## 2023-05-06 DIAGNOSIS — E039 Hypothyroidism, unspecified: Secondary | ICD-10-CM | POA: Diagnosis not present

## 2023-05-06 DIAGNOSIS — D696 Thrombocytopenia, unspecified: Secondary | ICD-10-CM | POA: Diagnosis not present

## 2023-05-06 DIAGNOSIS — I10 Essential (primary) hypertension: Secondary | ICD-10-CM | POA: Diagnosis not present

## 2023-05-06 DIAGNOSIS — M6281 Muscle weakness (generalized): Secondary | ICD-10-CM | POA: Diagnosis not present

## 2023-05-06 DIAGNOSIS — R531 Weakness: Secondary | ICD-10-CM | POA: Diagnosis not present

## 2023-05-06 DIAGNOSIS — Z7401 Bed confinement status: Secondary | ICD-10-CM | POA: Diagnosis not present

## 2023-05-06 DIAGNOSIS — C50412 Malignant neoplasm of upper-outer quadrant of left female breast: Secondary | ICD-10-CM | POA: Diagnosis not present

## 2023-05-06 DIAGNOSIS — S7291XD Unspecified fracture of right femur, subsequent encounter for closed fracture with routine healing: Secondary | ICD-10-CM | POA: Diagnosis not present

## 2023-05-06 DIAGNOSIS — S72491D Other fracture of lower end of right femur, subsequent encounter for closed fracture with routine healing: Secondary | ICD-10-CM | POA: Diagnosis not present

## 2023-05-06 DIAGNOSIS — H9193 Unspecified hearing loss, bilateral: Secondary | ICD-10-CM | POA: Diagnosis not present

## 2023-05-06 DIAGNOSIS — Z8781 Personal history of (healed) traumatic fracture: Secondary | ICD-10-CM | POA: Diagnosis not present

## 2023-05-06 DIAGNOSIS — S79929A Unspecified injury of unspecified thigh, initial encounter: Secondary | ICD-10-CM | POA: Diagnosis not present

## 2023-05-06 DIAGNOSIS — K7460 Unspecified cirrhosis of liver: Secondary | ICD-10-CM | POA: Diagnosis not present

## 2023-05-06 DIAGNOSIS — K219 Gastro-esophageal reflux disease without esophagitis: Secondary | ICD-10-CM | POA: Diagnosis not present

## 2023-05-06 DIAGNOSIS — M9711XD Periprosthetic fracture around internal prosthetic right knee joint, subsequent encounter: Secondary | ICD-10-CM | POA: Diagnosis not present

## 2023-05-06 DIAGNOSIS — S82831D Other fracture of upper and lower end of right fibula, subsequent encounter for closed fracture with routine healing: Secondary | ICD-10-CM | POA: Diagnosis not present

## 2023-05-06 DIAGNOSIS — N3281 Overactive bladder: Secondary | ICD-10-CM | POA: Diagnosis not present

## 2023-05-06 DIAGNOSIS — E66813 Obesity, class 3: Secondary | ICD-10-CM | POA: Diagnosis not present

## 2023-05-06 DIAGNOSIS — E1142 Type 2 diabetes mellitus with diabetic polyneuropathy: Secondary | ICD-10-CM | POA: Diagnosis not present

## 2023-05-06 LAB — GLUCOSE, CAPILLARY: Glucose-Capillary: 192 mg/dL — ABNORMAL HIGH (ref 70–99)

## 2023-05-06 MED ORDER — CARVEDILOL 3.125 MG PO TABS
3.1250 mg | ORAL_TABLET | Freq: Two times a day (BID) | ORAL | 0 refills | Status: DC
Start: 2023-05-06 — End: 2023-09-26

## 2023-05-06 MED ORDER — LOSARTAN POTASSIUM 25 MG PO TABS: 50.0000 mg | ORAL_TABLET | Freq: Every day | ORAL | 0 refills | Status: DC

## 2023-05-06 MED ORDER — ACETAMINOPHEN 325 MG PO TABS: 650.0000 mg | ORAL_TABLET | Freq: Four times a day (QID) | ORAL | 0 refills | Status: DC | PRN

## 2023-05-06 NOTE — TOC Progression Note (Addendum)
 Transition of Care [facility]) - Progression Note    Patient Details  Name: [name] [name] MRN: [id number] Date of Birth: 1950-03-03  Transition of Care [facility]) CM/SW Contact  Lorri Frederick, LCSW Phone Number: 05/06/2023, 8:30 AM  Clinical Narrative:    CSW spoke with pt and husband.  They would like to accept offer at [facility].    CSW confirmed with [facility] that they can receive pt today.  MD informed.   Medicare payer with inpt order 05/01/23.   Expected Discharge Plan: Home w Home Health Services Barriers to Discharge: Continued Medical Work up (OR 05/02/23 @ 1631)  Expected Discharge Plan and Services       Living arrangements for the past 2 months: Single Family Home                                       Social Determinants of Health (SDOH) Interventions SDOH Screenings   Food Insecurity: No Food Insecurity (05/01/2023)  Housing: Low Risk  (05/01/2023)  Transportation Needs: No Transportation Needs (05/01/2023)  Utilities: Not At Risk (05/01/2023)  Alcohol Screen: Low Risk  (03/16/2023)  Depression (PHQ2-9): Low Risk  (03/16/2023)  Recent Concern: Depression (PHQ2-9) - Medium Risk (12/29/2022)  Financial Resource Strain: Low Risk  (03/16/2023)  Physical Activity: Sufficiently Active (03/16/2023)  Social Connections: Moderately Integrated (05/01/2023)  Stress: No Stress Concern Present (03/16/2023)  Tobacco Use: Low Risk  (05/02/2023)  Health Literacy: Adequate Health Literacy (03/16/2023)    Readmission Risk Interventions     No data to display

## 2023-05-06 NOTE — Discharge Summary (Signed)
 Physician Discharge Summary  [name] [name] KGM:[id number] DOB: Oct 24, 1950 DOA: 05/01/2023  PCP: Anabel Halon, MD  Admit date: 05/01/2023 Discharge date: 05/06/2023  Admitted From: Home Disposition: SNF  Recommendations for Outpatient Follow-up:  Follow up with PCP in 1-2 weeks Follow-up with orthopedic surgery as scheduled  Discharge Condition: Stable CODE STATUS: Full Diet recommendation: Low-salt low-fat low-carb diet  Brief/Interim Summary: [name] [name] is a 73 y.o. female with medical history significant of ambulatory dysfunction, left foot droop, obesity, history of breast cancer, T2DM, and ambulatory dysfunction who presented after a fall, landing on her R knee while trying to get into her family's truck with profound right lower extremity pain, imaging in the ED confirmed comminuted fracture of the distal femoral metadiaphysis with acutely displaced fibular neck fracture.   Patient tolerated surgical repair 05/02/2023 quite well, continues to work with therapy -having obtained insurance approval and bed authorization patient is otherwise stable for discharge.   Discharge Diagnoses:  Principal Problem:   Right femoral fracture (HCC) Active Problems:   Essential hypertension   Type 2 diabetes mellitus with hyperlipidemia (HCC)   Thrombocytopenia (HCC)   Class 3 obesity   Malignant neoplasm of upper-outer quadrant of left breast in female, estrogen receptor positive (HCC)   Vertigo   Cirrhosis of liver without ascites (HCC)   Hypothyroidism  Right periprosthetic distal femur fracture Acute on chronic ambulatory dysfunction -Confirmed on imaging -Status post distal femoral plate 6/44/0347 -PT OT continue to follow -continue to recommend SNF - patient now agreeable for SNF placement as above.   AKI, resolved -Back to baseline, continue to advance diet as tolerated   Essential hypertension Continue carvedilol, continue losartan at half dose given improvement in prior  hypotension   Type 2 diabetes mellitus with hyperlipidemia, uncontrolled with hyperglycemia Chronic diabetic neuropathy A1c 6.5 Resume home insulin regimen Continue gabapentin   Chronic anemia of chronic disease Thrombocytopenia, acute on chronic Likely 2/2 history of malignancy, on Aromasin, now with recent surgery Recommend repeat CBC in the next 1 to 2 weeks   Malignant neoplasm of upper-outer quadrant of left breast in female, estrogen receptor positive (HCC) Continue with aromasin  Outpatient oncology follow-up   Vertigo Follow up with PT and OT post surgical intervention    Cirrhosis of liver without ascites (HCC) Likely NASH, no signs of acute decompensation  Follow up as outpatient   Hypothyroidism Continue with levothyroxine    Class 3 obesity Body mass index is 46.86 kg/m.  Discharge Instructions   Allergies as of 05/06/2023       Reactions   Codeine Swelling   Influenza A (h1n1) Monoval Pf Anaphylaxis   Hydrocodone-acetaminophen Other (See Comments)   unknown   Lisinopril Other (See Comments)   unknown   Metformin Other (See Comments)   unknown   Pregabalin Other (See Comments)   unknown   Jardiance [empagliflozin] Other (See Comments)   dehydration        Medication List     STOP taking these medications    traMADol 50 MG tablet Commonly known as: ULTRAM       TAKE these medications    Accu-Chek Aviva Plus test strip Generic drug: glucose blood USE AS INSTRUCTED TO CHECK BLOOD SUGAR 2 TIMES DAILY   acetaminophen 500 MG tablet Commonly known as: TYLENOL Take 500 mg by mouth every 8 (eight) hours as needed for moderate pain (pain score 4-6). What changed: Another medication with the same name was added. Make sure you understand how  and when to take each.   acetaminophen 325 MG tablet Commonly known as: TYLENOL Take 2 tablets (650 mg total) by mouth every 6 (six) hours as needed for mild pain (pain score 1-3) or moderate pain (pain  score 4-6) (or Fever >/= 101). What changed: You were already taking a medication with the same name, and this prescription was added. Make sure you understand how and when to take each.   albuterol 108 (90 Base) MCG/ACT inhaler Commonly known as: VENTOLIN HFA Inhale 1-2 puffs into the lungs every 4 (four) hours as needed for wheezing or shortness of breath.   buPROPion 150 MG 24 hr tablet Commonly known as: WELLBUTRIN XL TAKE 1 TABLET BY MOUTH EVERY DAY IN THE MORNING   carvedilol 3.125 MG tablet Commonly known as: COREG Take 1 tablet (3.125 mg total) by mouth 2 (two) times daily with a meal.   exemestane 25 MG tablet Commonly known as: AROMASIN TAKE 1 TABLET (25 MG TOTAL) BY MOUTH DAILY AFTER BREAKFAST.   Farxiga 10 MG Tabs tablet Generic drug: dapagliflozin propanediol TAKE 1 TABLET BY MOUTH DAILY BEFORE BREAKFAST.   fluticasone 50 MCG/ACT nasal spray Commonly known as: FLONASE Place 1 spray into both nostrils daily as needed for allergies.   gabapentin 600 MG tablet Commonly known as: NEURONTIN TAKE 1 TABLET IN THE MORNING AND 1 TABLET IN THE AFTERNOON AND 2TABS AT BEDTIME   Insulin Pen Needle 30G X 5 MM Misc 1 Device by Does not apply route in the morning, at noon, in the evening, and at bedtime.   B-D UF III MINI PEN NEEDLES 31G X 5 MM Misc Generic drug: Insulin Pen Needle USE AS DIRECTED WITH INSULIN EVERY MORNING, NOON, EVENING AND AT BEDTIME   ketoconazole 2 % cream Commonly known as: NIZORAL Apply 1 Application topically daily.   Lantus SoloStar 100 UNIT/ML Solostar Pen Generic drug: insulin glargine Inject 62 Units into the skin daily.   levothyroxine 150 MCG tablet Commonly known as: SYNTHROID Take 1 tablet (150 mcg total) by mouth daily.   losartan 25 MG tablet Commonly known as: COZAAR Take 2 tablets (50 mg total) by mouth daily. What changed: medication strength   meclizine 25 MG tablet Commonly known as: ANTIVERT Take 1 tablet (25 mg total) by  mouth 3 (three) times daily as needed for dizziness.   methocarbamol 500 MG tablet Commonly known as: ROBAXIN Take 1 tablet (500 mg total) by mouth every 8 (eight) hours as needed for muscle spasms.   mirabegron ER 25 MG Tb24 tablet Commonly known as: MYRBETRIQ Take 1 tablet (25 mg total) by mouth daily.   multivitamin with minerals tablet Take 1 tablet by mouth daily.   mycophenolate 500 MG tablet Commonly known as: CELLCEPT Take 1,500 mg by mouth 2 (two) times daily.   omeprazole 20 MG capsule Commonly known as: PRILOSEC Take 1 capsule (20 mg total) by mouth daily.   Ozempic (1 MG/DOSE) 4 MG/3ML Sopn Generic drug: Semaglutide (1 MG/DOSE) Inject 1 mg into the skin once a week.   promethazine-dextromethorphan 6.25-15 MG/5ML syrup Commonly known as: PROMETHAZINE-DM Take 5 mLs by mouth 4 (four) times daily as needed for cough.   UNABLE TO FIND 1 each by Does not apply route daily. Med Name: POWER WHEELCHAIR  DX CODE-R26.89   VITAMIN B-12 PO Take 1 tablet by mouth daily.   VITAMIN B-6 PO Take by mouth daily.   VITAMIN C PO Take 1 tablet by mouth daily.   VITAMIN D PO  Take 1 tablet by mouth daily.        Follow-up Information     Huel Cote, MD Follow up.   Specialty: Orthopedic Surgery Contact information: [address] [place] [address] [phone]                Allergies  Allergen Reactions   Codeine Swelling   Influenza A (H1n1) Monoval Pf Anaphylaxis   Hydrocodone-Acetaminophen Other (See Comments)    unknown   Lisinopril Other (See Comments)    unknown   Metformin Other (See Comments)    unknown   Pregabalin Other (See Comments)    unknown   Jardiance [Empagliflozin] Other (See Comments)    dehydration    Consultations: Orthopedic surgery  Procedures/Studies: US RENAL Result Date: 05/04/2023 CLINICAL DATA:  Acute kidney injury EXAM: RENAL / URINARY TRACT ULTRASOUND COMPLETE COMPARISON:  Renal ultrasound  08/19/2022 FINDINGS: Right Kidney: Renal measurements: 10.0 x 5.0 x 5.2 cm = volume: 133 mL. Echogenicity within normal limits. No mass or hydronephrosis visualized. Left Kidney: Renal measurements: 10.1 x 5.0 x 4.5 cm = volume: 116 mL. Echogenicity within normal limits. No mass or hydronephrosis visualized. Bladder: Appears normal for degree of bladder distention. Other: None. IMPRESSION: Normal renal ultrasound. Electronically Signed   By: Minerva Fester M.D.   On: 05/04/2023 00:36   DG FEMUR, MIN 2 VIEWS RIGHT Result Date: 05/02/2023 CLINICAL DATA:  ORIF EXAM: RIGHT FEMUR 2 VIEWS COMPARISON:  05/01/2023 FINDINGS: Multiple intraoperative spot images demonstrate plate and screw fixation across the distal femoral fracture. No hardware complicating feature. Near anatomic alignment. IMPRESSION: Internal fixation.  No visible complicating feature. Electronically Signed   By: Charlett Nose M.D.   On: 05/02/2023 22:15   DG C-Arm 1-60 Min-No Report Result Date: 05/02/2023 Fluoroscopy was utilized by the requesting physician.  No radiographic interpretation.   DG C-Arm 1-60 Min-No Report Result Date: 05/02/2023 Fluoroscopy was utilized by the requesting physician.  No radiographic interpretation.   DG Knee Complete 4 Views Right Result Date: 05/01/2023 CLINICAL DATA:  fall EXAM: RIGHT KNEE - COMPLETE 4+ VIEW COMPARISON:  X-ray right knee 04/07/2023 FINDINGS: Total right knee arthroplasty. Acute periprosthetic half shaft width posteriorly displaced and comminuted fracture of the distal femoral metadiaphysis. Acute minimally displaced fibular neck fracture. No dislocation no evidence of arthropathy or other focal bone abnormality. Soft tissues are unremarkable. IMPRESSION: 1. Total right knee arthroplasty with acute periprosthetic half shaft width posteriorly displaced and comminuted fracture of the distal femoral metadiaphysis. 2. Acute minimally displaced fibular neck fracture. Electronically Signed   By:  Tish Frederickson M.D.   On: 05/01/2023 14:00   XR Lumbar Spine 2-3 Views Result Date: 04/27/2023 AP lateral lumbar images are obtained and reviewed this shows multilevel disc space narrowing from T11 all the way to the sacrum.  Anterior and posterior spurs are noted at all levels which would narrow the canal. Impression: Multilevel lumbar disc degeneration straightening of the lumbar spine.  Negative for Acute compression fracture  XR Ankle Complete Right Result Date: 04/27/2023 Three-view x-rays right ankle obtained and reviewed this shows solid ankle fusion with intramedullary rod with interlock.  Midfoot is normal. Impression: Solid right ankle fusion no evidence of no acute trauma.  XR Ankle Complete Left Result Date: 04/27/2023 Three-view x-rays left ankle obtained and reviewed.  There is calcification areas in the deltoid ligament consistent with old ankle injury.  No subluxation of the ankle.  Some anterior spurring noted without subluxation on the lateral radiograph.  Impression: Changes consistent with old ligamentous injury.  No acute injury noted.  XR Knee 1-2 Views Right Result Date: 04/07/2023 Right knee AP/Lateral: No acute fracture . Status post right total knee arthroplasty with well seated components. No acute findings.     Subjective: No acute issues or events overnight denies nausea vomiting diarrhea constipation headache fevers chills or chest pain   Discharge Exam: Vitals:   05/06/23 0329 05/06/23 0800  BP: 136/68 (!) 152/73  Pulse: 96 94  Resp: 18 17  Temp: 98.7 F (37.1 C) 98.6 F (37 C)  SpO2: 98% 97%   Vitals:   05/05/23 1300 05/05/23 1938 05/06/23 0329 05/06/23 0800  BP: (!) 144/68 (!) 128/50 136/68 (!) 152/73  Pulse: 95 95 96 94  Resp: 17 18 18 17   Temp: 98.5 F (36.9 C) 99.5 F (37.5 C) 98.7 F (37.1 C) 98.6 F (37 C)  TempSrc: Oral   Oral  SpO2: 100% 100% 98% 97%  Weight:      Height:        General: Pt is alert, awake, not in acute  distress Cardiovascular: RRR, S1/S2 +, no rubs, no gallops Respiratory: CTA bilaterally, no wheezing, no rhonchi Abdominal: Soft, NT, ND, bowel sounds + Extremities: no edema, no cyanosis    The results of significant diagnostics from this hospitalization (including imaging, microbiology, ancillary and laboratory) are listed below for reference.     Microbiology: Recent Results (from the past 240 hours)  Surgical pcr screen     Status: Abnormal   Collection Time: 05/02/23  5:21 AM   Specimen: Nasal Mucosa; Nasal Swab  Result Value Ref Range Status   MRSA, PCR NEGATIVE NEGATIVE Final   Staphylococcus aureus POSITIVE (A) NEGATIVE Final    Comment: (NOTE) The Xpert SA Assay (FDA approved for NASAL specimens in patients 30 years of age and older), is one component of a comprehensive surveillance program. It is not intended to diagnose infection nor to guide or monitor treatment. Performed at [facility], [address] [place], [place] [address]      Labs: BNP (last 3 results) No results for input(s): "BNP" in the last 8760 hours. Basic Metabolic Panel: Recent Labs  Lab 05/01/23 1336 05/01/23 1611 05/02/23 0740 05/03/23 0624 05/04/23 0512  NA 133*  --  135 134* 132*  K 3.9  --  4.7 4.6 4.4  CL 103  --  102 103 107  CO2 21*  --  24 22 20*  GLUCOSE 178*  --  172* 180* 171*  BUN 17  --  24* 36* 26*  CREATININE 0.76 0.80 1.72* 2.20* 1.05*  CALCIUM 9.3  --  9.6 8.9 8.6*   Liver Function Tests: No results for input(s): "AST", "ALT", "ALKPHOS", "BILITOT", "PROT", "ALBUMIN" in the last 168 hours. No results for input(s): "LIPASE", "AMYLASE" in the last 168 hours. No results for input(s): "AMMONIA" in the last 168 hours. CBC: Recent Labs  Lab 05/01/23 1336 05/01/23 1611 05/02/23 0740 05/03/23 0624 05/04/23 0512 05/05/23 0607  WBC 3.2* 4.6 6.9 6.1 2.9* 2.0*  NEUTROABS 2.2  --   --   --  1.8  --   HGB 11.8* 11.1* 10.9* 8.8* 7.7* 8.2*  HCT 36.8 33.8* 35.2*  27.3* 23.8* 25.7*  MCV 87.8 86.7 89.3 89.2 89.1 89.2  PLT 68* 73* 126* 70* 44* 44*   Cardiac Enzymes: No results for input(s): "CKTOTAL", "CKMB", "CKMBINDEX", "TROPONINI" in the last 168 hours. BNP: Invalid input(s): "POCBNP" CBG: Recent Labs  Lab 05/04/23 2130 05/05/23 0630 05/05/23 1126 05/05/23 1630 05/06/23 0851  GLUCAP 143* 145* 240* 212* 192*   D-Dimer No results for input(s): "DDIMER" in the last 72 hours. Hgb A1c No results for input(s): "HGBA1C" in the last 72 hours. Lipid Profile No results for input(s): "CHOL", "HDL", "LDLCALC", "TRIG", "CHOLHDL", "LDLDIRECT" in the last 72 hours. Thyroid function studies No results for input(s): "TSH", "T4TOTAL", "T3FREE", "THYROIDAB" in the last 72 hours.  Invalid input(s): "FREET3" Anemia work up No results for input(s): "VITAMINB12", "FOLATE", "FERRITIN", "TIBC", "IRON", "RETICCTPCT" in the last 72 hours. Urinalysis    Component Value Date/Time   COLORURINE YELLOW 05/04/2023 0325   APPEARANCEUR CLEAR 05/04/2023 0325   APPEARANCEUR Clear 11/17/2022 0839   LABSPEC 1.013 05/04/2023 0325   PHURINE 5.0 05/04/2023 0325   GLUCOSEU NEGATIVE 05/04/2023 0325   HGBUR SMALL (A) 05/04/2023 0325   BILIRUBINUR NEGATIVE 05/04/2023 0325   BILIRUBINUR Negative 11/17/2022 0839   KETONESUR NEGATIVE 05/04/2023 0325   PROTEINUR 100 (A) 05/04/2023 0325   UROBILINOGEN 0.2 12/09/2011 1907   NITRITE NEGATIVE 05/04/2023 0325   LEUKOCYTESUR NEGATIVE 05/04/2023 0325   Sepsis Labs Recent Labs  Lab 05/02/23 0740 05/03/23 0624 05/04/23 0512 05/05/23 0607  WBC 6.9 6.1 2.9* 2.0*   Microbiology Recent Results (from the past 240 hours)  Surgical pcr screen     Status: Abnormal   Collection Time: 05/02/23  5:21 AM   Specimen: Nasal Mucosa; Nasal Swab  Result Value Ref Range Status   MRSA, PCR NEGATIVE NEGATIVE Final   Staphylococcus aureus POSITIVE (A) NEGATIVE Final    Comment: (NOTE) The Xpert SA Assay (FDA approved for NASAL specimens  in patients 1 years of age and older), is one component of a comprehensive surveillance program. It is not intended to diagnose infection nor to guide or monitor treatment. Performed at [facility], [address], [place], [place] [address]      Time coordinating discharge: Over 30 minutes  SIGNED:   Azucena Fallen, DO [facility] 05/06/2023, 9:58 AM Pager   If 7PM-7AM, please contact night-coverage [email or web address]

## 2023-05-06 NOTE — TOC Transition Note (Signed)
 Transition of Care [facility]) - Discharge Note   Patient Details  Name: [name] [name] MRN: [id number] Date of Birth: Jul 19, 1950  [facility] of [facility]) CM/SW Contact:  Lorri Frederick, LCSW Phone Number: 05/06/2023, 10:46 AM   Clinical Narrative:   Pt discharging to [facility], room 3205.  RN call report to [phone], ext [phone]  PTAR called [phone].     Final next level of care: Skilled Nursing Facility Barriers to Discharge: Barriers Resolved   Patient Goals and CMS Choice            Discharge Placement              Patient chooses bed at: [facility] Patient to be transferred to facility by: ptar Name of family member notified: husband [name] in room Patient and family notified of of transfer: 05/06/23  Discharge Plan and Services Additional resources added to the After Visit Summary for                                       Social Drivers of Health (SDOH) Interventions SDOH Screenings   Food Insecurity: No Food Insecurity (05/01/2023)  Housing: Low Risk  (05/01/2023)  Transportation Needs: No Transportation Needs (05/01/2023)  Utilities: Not At Risk (05/01/2023)  Alcohol Screen: Low Risk  (03/16/2023)  Depression (PHQ2-9): Low Risk  (03/16/2023)  Recent Concern: Depression (PHQ2-9) - Medium Risk (12/29/2022)  Financial Resource Strain: Low Risk  (03/16/2023)  Physical Activity: Sufficiently Active (03/16/2023)  Social Connections: Moderately Integrated (05/01/2023)  Stress: No Stress Concern Present (03/16/2023)  Tobacco Use: Low Risk  (05/02/2023)  Health Literacy: Adequate Health Literacy (03/16/2023)     Readmission Risk Interventions     No data to display

## 2023-05-06 NOTE — Plan of Care (Signed)

## 2023-05-06 NOTE — Progress Notes (Signed)
 Report called to Kim at [facility].

## 2023-05-09 DIAGNOSIS — E039 Hypothyroidism, unspecified: Secondary | ICD-10-CM | POA: Diagnosis not present

## 2023-05-09 DIAGNOSIS — K7460 Unspecified cirrhosis of liver: Secondary | ICD-10-CM | POA: Diagnosis not present

## 2023-05-09 DIAGNOSIS — E785 Hyperlipidemia, unspecified: Secondary | ICD-10-CM | POA: Diagnosis not present

## 2023-05-09 DIAGNOSIS — F419 Anxiety disorder, unspecified: Secondary | ICD-10-CM | POA: Diagnosis not present

## 2023-05-09 DIAGNOSIS — S7291XD Unspecified fracture of right femur, subsequent encounter for closed fracture with routine healing: Secondary | ICD-10-CM | POA: Diagnosis not present

## 2023-05-09 DIAGNOSIS — F3340 Major depressive disorder, recurrent, in remission, unspecified: Secondary | ICD-10-CM | POA: Diagnosis not present

## 2023-05-09 DIAGNOSIS — G4733 Obstructive sleep apnea (adult) (pediatric): Secondary | ICD-10-CM | POA: Diagnosis not present

## 2023-05-09 DIAGNOSIS — S7292XD Unspecified fracture of left femur, subsequent encounter for closed fracture with routine healing: Secondary | ICD-10-CM | POA: Diagnosis not present

## 2023-05-09 DIAGNOSIS — E1142 Type 2 diabetes mellitus with diabetic polyneuropathy: Secondary | ICD-10-CM | POA: Diagnosis not present

## 2023-05-09 DIAGNOSIS — H9193 Unspecified hearing loss, bilateral: Secondary | ICD-10-CM | POA: Diagnosis not present

## 2023-05-09 DIAGNOSIS — C50412 Malignant neoplasm of upper-outer quadrant of left female breast: Secondary | ICD-10-CM | POA: Diagnosis not present

## 2023-05-09 DIAGNOSIS — E1169 Type 2 diabetes mellitus with other specified complication: Secondary | ICD-10-CM | POA: Diagnosis not present

## 2023-05-10 ENCOUNTER — Telehealth: Payer: Self-pay

## 2023-05-10 ENCOUNTER — Telehealth: Payer: Self-pay | Admitting: Orthopaedic Surgery

## 2023-05-10 DIAGNOSIS — E1169 Type 2 diabetes mellitus with other specified complication: Secondary | ICD-10-CM | POA: Diagnosis not present

## 2023-05-10 DIAGNOSIS — G4733 Obstructive sleep apnea (adult) (pediatric): Secondary | ICD-10-CM | POA: Diagnosis not present

## 2023-05-10 DIAGNOSIS — S7291XD Unspecified fracture of right femur, subsequent encounter for closed fracture with routine healing: Secondary | ICD-10-CM | POA: Diagnosis not present

## 2023-05-10 DIAGNOSIS — H9193 Unspecified hearing loss, bilateral: Secondary | ICD-10-CM | POA: Diagnosis not present

## 2023-05-10 DIAGNOSIS — E039 Hypothyroidism, unspecified: Secondary | ICD-10-CM | POA: Diagnosis not present

## 2023-05-10 DIAGNOSIS — F419 Anxiety disorder, unspecified: Secondary | ICD-10-CM | POA: Diagnosis not present

## 2023-05-10 DIAGNOSIS — K7460 Unspecified cirrhosis of liver: Secondary | ICD-10-CM | POA: Diagnosis not present

## 2023-05-10 DIAGNOSIS — F3340 Major depressive disorder, recurrent, in remission, unspecified: Secondary | ICD-10-CM | POA: Diagnosis not present

## 2023-05-10 DIAGNOSIS — E1142 Type 2 diabetes mellitus with diabetic polyneuropathy: Secondary | ICD-10-CM | POA: Diagnosis not present

## 2023-05-10 DIAGNOSIS — C50412 Malignant neoplasm of upper-outer quadrant of left female breast: Secondary | ICD-10-CM | POA: Diagnosis not present

## 2023-05-10 DIAGNOSIS — E785 Hyperlipidemia, unspecified: Secondary | ICD-10-CM | POA: Diagnosis not present

## 2023-05-10 DIAGNOSIS — S7292XD Unspecified fracture of left femur, subsequent encounter for closed fracture with routine healing: Secondary | ICD-10-CM | POA: Diagnosis not present

## 2023-05-10 NOTE — Telephone Encounter (Signed)
 Scheduled

## 2023-05-10 NOTE — Telephone Encounter (Signed)
 Patient and her husband called triage phone asking if they could transfer her care for her recent leg injury over to you. I advised normal protocol would be for her to follow up with Dr Steward Drone since he did the surgery but patient and her spouse insisted on seeing if you would be willing to take over her post op care. Please advise.

## 2023-05-10 NOTE — Telephone Encounter (Signed)
 Niecey called from [facility] for patient and she needs a post op appointment. CB#[phone]

## 2023-05-11 DIAGNOSIS — G4733 Obstructive sleep apnea (adult) (pediatric): Secondary | ICD-10-CM | POA: Diagnosis not present

## 2023-05-11 DIAGNOSIS — E1169 Type 2 diabetes mellitus with other specified complication: Secondary | ICD-10-CM | POA: Diagnosis not present

## 2023-05-11 DIAGNOSIS — E785 Hyperlipidemia, unspecified: Secondary | ICD-10-CM | POA: Diagnosis not present

## 2023-05-11 DIAGNOSIS — F419 Anxiety disorder, unspecified: Secondary | ICD-10-CM | POA: Diagnosis not present

## 2023-05-11 DIAGNOSIS — K7460 Unspecified cirrhosis of liver: Secondary | ICD-10-CM | POA: Diagnosis not present

## 2023-05-11 DIAGNOSIS — E1142 Type 2 diabetes mellitus with diabetic polyneuropathy: Secondary | ICD-10-CM | POA: Diagnosis not present

## 2023-05-11 DIAGNOSIS — F3340 Major depressive disorder, recurrent, in remission, unspecified: Secondary | ICD-10-CM | POA: Diagnosis not present

## 2023-05-11 DIAGNOSIS — S7291XD Unspecified fracture of right femur, subsequent encounter for closed fracture with routine healing: Secondary | ICD-10-CM | POA: Diagnosis not present

## 2023-05-11 DIAGNOSIS — C50412 Malignant neoplasm of upper-outer quadrant of left female breast: Secondary | ICD-10-CM | POA: Diagnosis not present

## 2023-05-11 DIAGNOSIS — H9193 Unspecified hearing loss, bilateral: Secondary | ICD-10-CM | POA: Diagnosis not present

## 2023-05-11 DIAGNOSIS — E039 Hypothyroidism, unspecified: Secondary | ICD-10-CM | POA: Diagnosis not present

## 2023-05-11 DIAGNOSIS — S7292XD Unspecified fracture of left femur, subsequent encounter for closed fracture with routine healing: Secondary | ICD-10-CM | POA: Diagnosis not present

## 2023-05-13 DIAGNOSIS — F419 Anxiety disorder, unspecified: Secondary | ICD-10-CM | POA: Diagnosis not present

## 2023-05-13 DIAGNOSIS — S7291XD Unspecified fracture of right femur, subsequent encounter for closed fracture with routine healing: Secondary | ICD-10-CM | POA: Diagnosis not present

## 2023-05-13 DIAGNOSIS — F3340 Major depressive disorder, recurrent, in remission, unspecified: Secondary | ICD-10-CM | POA: Diagnosis not present

## 2023-05-13 DIAGNOSIS — H9193 Unspecified hearing loss, bilateral: Secondary | ICD-10-CM | POA: Diagnosis not present

## 2023-05-13 DIAGNOSIS — C50412 Malignant neoplasm of upper-outer quadrant of left female breast: Secondary | ICD-10-CM | POA: Diagnosis not present

## 2023-05-13 DIAGNOSIS — E785 Hyperlipidemia, unspecified: Secondary | ICD-10-CM | POA: Diagnosis not present

## 2023-05-13 DIAGNOSIS — E1169 Type 2 diabetes mellitus with other specified complication: Secondary | ICD-10-CM | POA: Diagnosis not present

## 2023-05-13 DIAGNOSIS — K7460 Unspecified cirrhosis of liver: Secondary | ICD-10-CM | POA: Diagnosis not present

## 2023-05-13 DIAGNOSIS — E1142 Type 2 diabetes mellitus with diabetic polyneuropathy: Secondary | ICD-10-CM | POA: Diagnosis not present

## 2023-05-13 DIAGNOSIS — E039 Hypothyroidism, unspecified: Secondary | ICD-10-CM | POA: Diagnosis not present

## 2023-05-13 DIAGNOSIS — G4733 Obstructive sleep apnea (adult) (pediatric): Secondary | ICD-10-CM | POA: Diagnosis not present

## 2023-05-13 DIAGNOSIS — S7292XD Unspecified fracture of left femur, subsequent encounter for closed fracture with routine healing: Secondary | ICD-10-CM | POA: Diagnosis not present

## 2023-05-16 ENCOUNTER — Ambulatory Visit (INDEPENDENT_AMBULATORY_CARE_PROVIDER_SITE_OTHER): Admitting: Orthopaedic Surgery

## 2023-05-16 ENCOUNTER — Encounter (HOSPITAL_BASED_OUTPATIENT_CLINIC_OR_DEPARTMENT_OTHER): Admitting: Student

## 2023-05-16 ENCOUNTER — Other Ambulatory Visit (INDEPENDENT_AMBULATORY_CARE_PROVIDER_SITE_OTHER): Payer: Self-pay

## 2023-05-16 DIAGNOSIS — K7460 Unspecified cirrhosis of liver: Secondary | ICD-10-CM | POA: Diagnosis not present

## 2023-05-16 DIAGNOSIS — C50412 Malignant neoplasm of upper-outer quadrant of left female breast: Secondary | ICD-10-CM | POA: Diagnosis not present

## 2023-05-16 DIAGNOSIS — S7292XD Unspecified fracture of left femur, subsequent encounter for closed fracture with routine healing: Secondary | ICD-10-CM | POA: Diagnosis not present

## 2023-05-16 DIAGNOSIS — Z8781 Personal history of (healed) traumatic fracture: Secondary | ICD-10-CM | POA: Diagnosis not present

## 2023-05-16 DIAGNOSIS — E1169 Type 2 diabetes mellitus with other specified complication: Secondary | ICD-10-CM | POA: Diagnosis not present

## 2023-05-16 DIAGNOSIS — S7291XD Unspecified fracture of right femur, subsequent encounter for closed fracture with routine healing: Secondary | ICD-10-CM | POA: Diagnosis not present

## 2023-05-16 DIAGNOSIS — F419 Anxiety disorder, unspecified: Secondary | ICD-10-CM | POA: Diagnosis not present

## 2023-05-16 DIAGNOSIS — E039 Hypothyroidism, unspecified: Secondary | ICD-10-CM | POA: Diagnosis not present

## 2023-05-16 DIAGNOSIS — G4733 Obstructive sleep apnea (adult) (pediatric): Secondary | ICD-10-CM | POA: Diagnosis not present

## 2023-05-16 DIAGNOSIS — H9193 Unspecified hearing loss, bilateral: Secondary | ICD-10-CM | POA: Diagnosis not present

## 2023-05-16 DIAGNOSIS — Z9889 Other specified postprocedural states: Secondary | ICD-10-CM | POA: Diagnosis not present

## 2023-05-16 DIAGNOSIS — F3340 Major depressive disorder, recurrent, in remission, unspecified: Secondary | ICD-10-CM | POA: Diagnosis not present

## 2023-05-16 DIAGNOSIS — E785 Hyperlipidemia, unspecified: Secondary | ICD-10-CM | POA: Diagnosis not present

## 2023-05-16 DIAGNOSIS — E1142 Type 2 diabetes mellitus with diabetic polyneuropathy: Secondary | ICD-10-CM | POA: Diagnosis not present

## 2023-05-16 NOTE — Progress Notes (Signed)
 The patient is here today 2 weeks status post open reduction/internal fixation of a right distal femur periprosthetic fracture.  This was at the level of the previous total knee arthroplasty.  We had replaced her right knee back in 2017 secondary to significant arthritis.  Unfortunately 2 weeks ago she had a significant mechanical fall injuring her right distal femur and she sustained a periprosthetic fracture.  My partner Dr. Hermina Loosen took her to the operating room 2 weeks ago today for open reduction/instrumentation with plating of the right distal femur.  She is currently staying in a skilled nursing facility.  She is appropriate been nonweightbearing on her right lower extremity.  The incision on the right lateral thigh and knee look good.  Staples are removed and Steri-Strips applied.  There is not a lot of swelling with her leg.  Distally she has a soft calf.  2 views of the right distal femur show the plate is in good alignment as well as the bone.  She will remain nonweightbearing until further notice.  We will see her back in 4 weeks with a repeat 2 views of the right distal femur.  This should still be done supine.

## 2023-05-17 DIAGNOSIS — E1169 Type 2 diabetes mellitus with other specified complication: Secondary | ICD-10-CM | POA: Diagnosis not present

## 2023-05-17 DIAGNOSIS — F3340 Major depressive disorder, recurrent, in remission, unspecified: Secondary | ICD-10-CM | POA: Diagnosis not present

## 2023-05-17 DIAGNOSIS — H9193 Unspecified hearing loss, bilateral: Secondary | ICD-10-CM | POA: Diagnosis not present

## 2023-05-17 DIAGNOSIS — E039 Hypothyroidism, unspecified: Secondary | ICD-10-CM | POA: Diagnosis not present

## 2023-05-17 DIAGNOSIS — S7291XD Unspecified fracture of right femur, subsequent encounter for closed fracture with routine healing: Secondary | ICD-10-CM | POA: Diagnosis not present

## 2023-05-17 DIAGNOSIS — C50412 Malignant neoplasm of upper-outer quadrant of left female breast: Secondary | ICD-10-CM | POA: Diagnosis not present

## 2023-05-17 DIAGNOSIS — E785 Hyperlipidemia, unspecified: Secondary | ICD-10-CM | POA: Diagnosis not present

## 2023-05-17 DIAGNOSIS — E1142 Type 2 diabetes mellitus with diabetic polyneuropathy: Secondary | ICD-10-CM | POA: Diagnosis not present

## 2023-05-17 DIAGNOSIS — G4733 Obstructive sleep apnea (adult) (pediatric): Secondary | ICD-10-CM | POA: Diagnosis not present

## 2023-05-17 DIAGNOSIS — S7292XD Unspecified fracture of left femur, subsequent encounter for closed fracture with routine healing: Secondary | ICD-10-CM | POA: Diagnosis not present

## 2023-05-17 DIAGNOSIS — K7460 Unspecified cirrhosis of liver: Secondary | ICD-10-CM | POA: Diagnosis not present

## 2023-05-17 DIAGNOSIS — F419 Anxiety disorder, unspecified: Secondary | ICD-10-CM | POA: Diagnosis not present

## 2023-05-18 DIAGNOSIS — S7292XD Unspecified fracture of left femur, subsequent encounter for closed fracture with routine healing: Secondary | ICD-10-CM | POA: Diagnosis not present

## 2023-05-18 DIAGNOSIS — G4733 Obstructive sleep apnea (adult) (pediatric): Secondary | ICD-10-CM | POA: Diagnosis not present

## 2023-05-18 DIAGNOSIS — H9193 Unspecified hearing loss, bilateral: Secondary | ICD-10-CM | POA: Diagnosis not present

## 2023-05-18 DIAGNOSIS — E1142 Type 2 diabetes mellitus with diabetic polyneuropathy: Secondary | ICD-10-CM | POA: Diagnosis not present

## 2023-05-18 DIAGNOSIS — K7460 Unspecified cirrhosis of liver: Secondary | ICD-10-CM | POA: Diagnosis not present

## 2023-05-18 DIAGNOSIS — E785 Hyperlipidemia, unspecified: Secondary | ICD-10-CM | POA: Diagnosis not present

## 2023-05-18 DIAGNOSIS — F419 Anxiety disorder, unspecified: Secondary | ICD-10-CM | POA: Diagnosis not present

## 2023-05-18 DIAGNOSIS — C50412 Malignant neoplasm of upper-outer quadrant of left female breast: Secondary | ICD-10-CM | POA: Diagnosis not present

## 2023-05-18 DIAGNOSIS — F3340 Major depressive disorder, recurrent, in remission, unspecified: Secondary | ICD-10-CM | POA: Diagnosis not present

## 2023-05-18 DIAGNOSIS — E1169 Type 2 diabetes mellitus with other specified complication: Secondary | ICD-10-CM | POA: Diagnosis not present

## 2023-05-18 DIAGNOSIS — S7291XD Unspecified fracture of right femur, subsequent encounter for closed fracture with routine healing: Secondary | ICD-10-CM | POA: Diagnosis not present

## 2023-05-18 DIAGNOSIS — E039 Hypothyroidism, unspecified: Secondary | ICD-10-CM | POA: Diagnosis not present

## 2023-05-18 NOTE — Progress Notes (Deleted)
 Name: [name] [name] DOB: 08-05-1950 MRN: [id number]  History of Present Illness: [name] is a 73 y.o. female who presents today for follow up visit at [facility] [place].  ***She is accompanied by ***. GU History includes: 1. OAB with urinary frequency, urgency, and urge incontinence.  Urology visit on 08/24/2022: - Reported dysuria, gross hematuria, hesitancy, straining to void, sensations of incomplete emptying. Also gas / bloating / abdomen swelling daily x1-2 months; followed by GI.  - UA negative. - PVR = 0 ml. - The plan was: 1. Myrbetriq 25 mg daily. 2. Minimize caffeine intake.  At last visit with Dr. Ronne Binning on 11/17/2022: Cystoscopic evaluation was unremarkable; no fistula.   Since last visit: > 05/03/2023: Normal renal US with no GU stones, masses, or hydronephrosis; bladder unremarkable.  > 05/04/2023: Urine microscopy unremarkable.   Today: She {Actions; denies-reports:120008} symptomatic improvement since starting Myrbetriq (Mirabegron) 25 mg daily.  She reports {Blank multiple:19197::"improved","persistent / unchanged"} urinary ***frequency, ***nocturia, ***urgency, and ***urge incontinence. Voiding ***x/day and ***x/night on average. Leaking ***x/day on average; using *** ***pads / ***diapers per day on average.  She {Actions; denies-reports:120008} significant caffeine intake (*** caffeinated beverages per day on average).  She {Actions; denies-reports:120008} dysuria, gross hematuria, straining to void, or sensations of incomplete emptying.  Medications: Current Outpatient Medications  Medication Sig Dispense Refill   ACCU-CHEK AVIVA PLUS test strip USE AS INSTRUCTED TO CHECK BLOOD SUGAR 2 TIMES DAILY 100 strip 5   acetaminophen (TYLENOL) 325 MG tablet Take 2 tablets (650 mg total) by mouth every 6 (six) hours as needed for mild pain (pain score 1-3) or moderate pain (pain score 4-6) (or Fever >/= 101). 30 tablet 0   acetaminophen (TYLENOL) 500 MG  tablet Take 500 mg by mouth every 8 (eight) hours as needed for moderate pain (pain score 4-6).     albuterol (VENTOLIN HFA) 108 (90 Base) MCG/ACT inhaler Inhale 1-2 puffs into the lungs every 4 (four) hours as needed for wheezing or shortness of breath. 18 g 2   Ascorbic Acid (VITAMIN C PO) Take 1 tablet by mouth daily.     buPROPion (WELLBUTRIN XL) 150 MG 24 hr tablet TAKE 1 TABLET BY MOUTH EVERY DAY IN THE MORNING 90 tablet 1   carvedilol (COREG) 3.125 MG tablet Take 1 tablet (3.125 mg total) by mouth 2 (two) times daily with a meal. 180 tablet 0   Cyanocobalamin (VITAMIN B-12 PO) Take 1 tablet by mouth daily.     exemestane (AROMASIN) 25 MG tablet TAKE 1 TABLET (25 MG TOTAL) BY MOUTH DAILY AFTER BREAKFAST. 90 tablet 1   FARXIGA 10 MG TABS tablet TAKE 1 TABLET BY MOUTH DAILY BEFORE BREAKFAST. 90 tablet 3   fluticasone (FLONASE) 50 MCG/ACT nasal spray Place 1 spray into both nostrils daily as needed for allergies.     gabapentin (NEURONTIN) 600 MG tablet TAKE 1 TABLET IN THE MORNING AND 1 TABLET IN THE AFTERNOON AND 2TABS AT BEDTIME     insulin glargine (LANTUS SOLOSTAR) 100 UNIT/ML Solostar Pen Inject 62 Units into the skin daily. 30 mL 0   Insulin Pen Needle (B-D UF III MINI PEN NEEDLES) 31G X 5 MM MISC USE AS DIRECTED WITH INSULIN EVERY MORNING, NOON, EVENING AND AT BEDTIME 400 each 3   Insulin Pen Needle 30G X 5 MM MISC 1 Device by Does not apply route in the morning, at noon, in the evening, and at bedtime. 400 each 3   ketoconazole (NIZORAL) 2 % cream  Apply 1 Application topically daily. 60 g 0   levothyroxine (SYNTHROID) 150 MCG tablet Take 1 tablet (150 mcg total) by mouth daily. 90 tablet 3   losartan (COZAAR) 25 MG tablet Take 2 tablets (50 mg total) by mouth daily. 30 tablet 0   meclizine (ANTIVERT) 25 MG tablet Take 1 tablet (25 mg total) by mouth 3 (three) times daily as needed for dizziness. 30 tablet 0   methocarbamol (ROBAXIN) 500 MG tablet Take 1 tablet (500 mg total) by mouth  every 8 (eight) hours as needed for muscle spasms. 30 tablet 0   mirabegron ER (MYRBETRIQ) 25 MG TB24 tablet Take 1 tablet (25 mg total) by mouth daily. 30 tablet 11   Multiple Vitamins-Minerals (MULTIVITAMIN WITH MINERALS) tablet Take 1 tablet by mouth daily.     mycophenolate (CELLCEPT) 500 MG tablet Take 1,500 mg by mouth 2 (two) times daily.     omeprazole (PRILOSEC) 20 MG capsule Take 1 capsule (20 mg total) by mouth daily. 90 capsule 3   promethazine-dextromethorphan (PROMETHAZINE-DM) 6.25-15 MG/5ML syrup Take 5 mLs by mouth 4 (four) times daily as needed for cough. 180 mL 0   Pyridoxine HCl (VITAMIN B-6 PO) Take by mouth daily.     Semaglutide, 1 MG/DOSE, (OZEMPIC, 1 MG/DOSE,) 4 MG/3ML SOPN Inject 1 mg into the skin once a week. 3 mL 5   UNABLE TO FIND 1 each by Does not apply route daily. Med Name: POWER WHEELCHAIR  DX CODE-R26.89 1 each 0   VITAMIN D PO Take 1 tablet by mouth daily.     No current facility-administered medications for this visit.    Allergies: Allergies  Allergen Reactions   Codeine Swelling   Influenza A (H1n1) Monoval Pf Anaphylaxis   Hydrocodone-Acetaminophen Other (See Comments)    unknown   Lisinopril Other (See Comments)    unknown   Metformin Other (See Comments)    unknown   Pregabalin Other (See Comments)    unknown   Jardiance [Empagliflozin] Other (See Comments)    dehydration    Past Medical History:  Diagnosis Date   Abdominal wall contusion 12/09/2011   Achilles tendon contracture, bilateral 06/03/2016   Anxiety    Arthritis    Blood dyscrasia    low platelet count- sees [name] , Dr. Missie Ammons ( Note in EPIC from 07/2014) at [facility]   Breast cancer [facility]) 2017   Left Breast   Breast cancer (HCC) 07/2021   right breast IDC   Cancer (HCC)    left breast   Chest wall contusion 12/09/2011   Cirrhosis of liver (HCC)    Cough    Diabetes mellitus without complication (HCC)    Diabetic neuropathy (HCC)     Diarrhea due to drug 01/29/2021   Dyspnea    Gout    Gout    History of radiation therapy 10/22/15 - 12/08/15   left breast 50.4 Gy, boost to 10 Gy   History of radiation therapy    Right breast- 09/22/21-10/30/21- Dr. 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   Neuromuscular disorder (HCC)    diabetic neuropathy   Neuropathy    Personal history of radiation therapy 2017   Left Breast Cancer   Posterior tibial tendon dysfunction (PTTD) of right lower extremity 10/09/2019   Sleep apnea    CPAP nightly   Spleen enlarged    Thyroid disease    Past Surgical History:  Procedure Laterality Date   ANKLE FUSION Right 2022   BIOPSY  10/19/2022   Procedure: BIOPSY;  Surgeon: Dolores Frame, MD;  [facility];  Service: Gastroenterology;;   BREAST BIOPSY Left 08/04/2015   Procedure: LEFT BREAST BIOPSY WITH NEEDLE LOCALIZATION;  Surgeon: Darnell Level, MD;  Location: [facility];  Service: General;  Laterality: Left;   BREAST BIOPSY Right 06/23/2021   BREAST DUCTAL SYSTEM EXCISION Left 08/04/2015   Procedure: LEFT EXCISION DUCTAL SYSTEM BREAST;  Surgeon: Darnell Level, MD;  Location: [facility];  Service: General;  Laterality: Left;   BREAST LUMPECTOMY Left 08/2015   BREAST LUMPECTOMY Right 08/2021   BREAST LUMPECTOMY WITH AXILLARY LYMPH NODE BIOPSY Left 09/12/2015   Procedure: RE-EXCISION OF LEFT BREAST LUMPECTOMY WITH LEFT AXILLARY LYMPH NODE BIOPSY;  Surgeon: Darnell Level, MD;  Location: [facility];  Service: General;  Laterality: Left;   BREAST LUMPECTOMY WITH RADIOACTIVE SEED LOCALIZATION Right 08/12/2021   Procedure: RIGHT BREAST LUMPECTOMY WITH RADIOACTIVE SEED LOCALIZATION;  Surgeon: Abigail Miyamoto, MD;  Location: [facility];  Service: General;  Laterality: Right;  LMA   CHOLECYSTECTOMY     ESOPHAGOGASTRODUODENOSCOPY (EGD) WITH PROPOFOL N/A  10/19/2022   Procedure: ESOPHAGOGASTRODUODENOSCOPY (EGD) WITH PROPOFOL;  Surgeon: Dolores Frame, MD;  [facility];  Service: Gastroenterology;  Laterality: N/A;  10:30am asa 3   EYE SURGERY Bilateral 2022   Lashes growing backwards into the eye   JOINT REPLACEMENT     left knee   JOINT REPLACEMENT  2017   right knee   ORIF FEMUR FRACTURE Right 05/02/2023   Procedure: OPEN REDUCTION INTERNAL FIXATION (ORIF) DISTAL FEMUR FRACTURE;  Surgeon: Huel Cote, MD;  Location: [facility];  Service: Orthopedics;  Laterality: Right;   REVERSE SHOULDER ARTHROPLASTY Right 09/07/2018   Procedure: RIGHT REVERSE SHOULDER ARTHROPLASTY;  Surgeon: Cammy Copa, MD;  [facility];  Service: Orthopedics;  Laterality: Right;   TOTAL KNEE ARTHROPLASTY Right 02/28/2015   Procedure: RIGHT TOTAL KNEE ARTHROPLASTY;  Surgeon: Kathryne Hitch, MD;  Location: [facility];  Service: Orthopedics;  Laterality: Right;   TUBAL LIGATION     Family History  Problem Relation Age of Onset   Hypothyroidism Mother    Alzheimer's disease Mother    Diabetes Father    Cancer Sister        GYN cancer   Hypothyroidism Sister    Heart disease Brother    Arthritis Son    Diabetes Son    Arthritis Son    Cancer Maternal Grandmother    Heart failure Maternal Grandfather    Bone cancer Paternal Grandmother    Heart failure Paternal Grandmother    Social History   Socioeconomic History   Marital status: Married    Spouse name: [name] Calaway   Number of children: 3   Years of education: 12th   Highest education level: 12th grade  Occupational History   Occupation: Retired    Comment: Dance movement psychotherapist  Tobacco Use   Smoking status: Never    Passive exposure: Never   Smokeless tobacco: Never  Vaping Use   Vaping status: Never Used  Substance and Sexual Activity   Alcohol use: No   Drug use: No   Sexual activity: Yes    Partners: Male    Birth control/protection: Surgical    Comment:  BTL  Other Topics Concern   Not on file  Social History Narrative   Not on file  Social Drivers of Corporate investment banker Strain: Low Risk  (03/16/2023)   Overall Financial Resource Strain (CARDIA)    Difficulty of Paying Living Expenses: Not hard at all  Food Insecurity: No Food Insecurity (05/01/2023)   Hunger Vital Sign    Worried About Running Out of Food in the Last Year: Never true    Ran Out of Food in the Last Year: Never true  Transportation Needs: No Transportation Needs (05/01/2023)   PRAPARE - Administrator, Civil Service (Medical): No    Lack of Transportation (Non-Medical): No  Physical Activity: Sufficiently Active (03/16/2023)   Exercise Vital Sign    Days of Exercise per Week: 5 days    Minutes of Exercise per Session: 50 min  Stress: No Stress Concern Present (03/16/2023)   [employer] of Occupational Health - Occupational Stress Questionnaire    Feeling of Stress : Only a little  Social Connections: Moderately Integrated (05/01/2023)   Social Connection and Isolation Panel [NHANES]    Frequency of Communication with Friends and Family: More than three times a week    Frequency of Social Gatherings with Friends and Family: More than three times a week    Attends Religious Services: Never    Database administrator or Organizations: Yes    Attends Engineer, structural: More than 4 times per year    Marital Status: Married  Catering manager Violence: Not At Risk (05/01/2023)   Humiliation, Afraid, Rape, and Kick questionnaire    Fear of Current or Ex-Partner: No    Emotionally Abused: No    Physically Abused: No    Sexually Abused: No    Review of Systems Constitutional: Patient denies any unintentional weight loss or change in strength lntegumentary: Patient denies any rashes or pruritus Eyes: Patient {Actions; denies-reports:120008} dry eyes ENT: Patient {Actions; denies-reports:120008} dry mouth Cardiovascular: Patient denies  chest pain or syncope Respiratory: Patient denies shortness of breath Gastrointestinal: ***Patient {Actions; denies-reports:120008} ***nausea, ***vomiting, ***constipation, ***diarrhea ***As per HPI Musculoskeletal: Patient denies muscle cramps or weakness Neurologic: Patient denies convulsions or seizures Allergic/Immunologic: Patient denies recent allergic reaction(s) Hematologic/Lymphatic: Patient denies bleeding tendencies Endocrine: Patient denies heat/cold intolerance  GU: As per HPI.  OBJECTIVE There were no vitals filed for this visit. There is no height or weight on file to calculate BMI.  Physical Examination Constitutional: No obvious distress; patient is non-toxic appearing  Cardiovascular: No visible lower extremity edema.  Respiratory: The patient does not have audible wheezing/stridor; respirations do not appear labored  Gastrointestinal: Abdomen non-distended Musculoskeletal: Normal ROM of UEs  Skin: No obvious rashes/open sores  Neurologic: CN 2-12 grossly intact Psychiatric: Answered questions appropriately with normal affect  Hematologic/Lymphatic/Immunologic: No obvious bruises or sites of spontaneous bleeding  UA: ***negative ***positive for *** leukocytes, *** blood, ***nitrites Urine microscopy: *** WBC/hpf, *** RBC/hpf, *** bacteria ***otherwise unremarkable ***glucosuria (secondary to ***Jardiance ***Farxiga use)  PVR: *** ml  ASSESSMENT No diagnosis found.  We discussed the symptoms of overactive bladder (OAB), which include urinary urgency, frequency, nocturia, with or without urge incontinence.  While we [place] not know the exact etiology of OAB, several risk factors can be identified.  - Patient's neurogenic risk factors: ***T2DM ***with neuropathy, ***nicotine use, ***spinal stenosis, ***prior stroke, ***dementia.  - Patient's exacerbating factors include: ***diuretic use, ***caffeine intake, ***glucosuria (due to ***Jardiance / ***Farxiga use),  ***ambulatory dysfunction (functional incontinence).   We discussed the following management options in detail including potential benefits, risks, and side effects: Behavioral  therapy: Modify fluid intake Minimize / avoid bladder irritants (such as caffeine, spicy foods, acidic foods, alcohol) Bladder retraining / timed voiding Double voiding Medication(s): ***- We discussed potential side effects of anticholinergic medications such as urinary retention, dry eyes, dry mouth, constipation, confusion, cognitive impairment / dementia.  ***- Not a safe candidate for anticholinergic medications due to risk for side effects based on patient's age, comorbidities, and pre-existing ***dry mouth ***dry eyes ***constipation ***dementia ***Parkinsons disease ***MS.  ***- Beta-3 agonist medications: We discussed potential side effects of beta-3 agonist medications such as urinary retention and (infrequently) elevated blood pressure.  ***- Combination therapy with anticholinergic medication + beta-3 agonist medication. 3. For refractory cases: PTNS (posterior tibial nerve stimulation) ***Not a safe candidate for PTNS due to ***bleeding disorder, ***anticoagulant use, ***pregnancy, ***pacemaker, ***implanted cardiac defibrillator (ICD), ***neuropathy / nerve damage / nerve conduction disorder, ***lower extremity metal implant(s).  Sacral neuromodulation trial (Medtronic lnterStim or Axonics implant) Bladder Botox injections  ***Consider discussing possible alternatives to ***Jardiance ***Farxiga with prescribing provider. This medication causes excess sugar to be excreted into the urine. That can prompt the kidneys to put out more water to dilute that sugar in the urine and it can also irritate the bladder lining, both of which Kunkle contribute to OAB symptoms (urinary frequency, urgency, and urge incontinence).  She decided to proceed with *** ***behavioral modifications including ***minimizing / avoiding  caffeine intake and working on ***timed voiding / bladder retraining.  Will plan for follow up in *** weeks / *** months or sooner if needed. Pt verbalized understanding and agreement. All questions were answered.  PLAN Advised the following: ***. *** ***. Minimize / avoid caffeine intake. ***. Work on timed voiding / bladder retraining. ***. No follow-ups on file.  No orders of the defined types were placed in this encounter.   It has been explained that the patient is to follow regularly with their PCP in addition to all other providers involved in their care and to follow instructions provided by these respective offices. Patient advised to contact urology clinic if any urologic-pertaining questions, concerns, new symptoms or problems arise in the interim period.  There are no Patient Instructions on file for this visit.  Electronically signed by:  Lauretta Ponto, FNP   05/18/23    10:22 PM

## 2023-05-19 ENCOUNTER — Ambulatory Visit: Payer: Medicare Other | Admitting: Urology

## 2023-05-19 DIAGNOSIS — N3281 Overactive bladder: Secondary | ICD-10-CM

## 2023-05-19 DIAGNOSIS — N3941 Urge incontinence: Secondary | ICD-10-CM

## 2023-05-20 DIAGNOSIS — S7292XD Unspecified fracture of left femur, subsequent encounter for closed fracture with routine healing: Secondary | ICD-10-CM | POA: Diagnosis not present

## 2023-05-20 DIAGNOSIS — E1142 Type 2 diabetes mellitus with diabetic polyneuropathy: Secondary | ICD-10-CM | POA: Diagnosis not present

## 2023-05-20 DIAGNOSIS — F419 Anxiety disorder, unspecified: Secondary | ICD-10-CM | POA: Diagnosis not present

## 2023-05-20 DIAGNOSIS — E785 Hyperlipidemia, unspecified: Secondary | ICD-10-CM | POA: Diagnosis not present

## 2023-05-20 DIAGNOSIS — E039 Hypothyroidism, unspecified: Secondary | ICD-10-CM | POA: Diagnosis not present

## 2023-05-20 DIAGNOSIS — K7460 Unspecified cirrhosis of liver: Secondary | ICD-10-CM | POA: Diagnosis not present

## 2023-05-20 DIAGNOSIS — E1169 Type 2 diabetes mellitus with other specified complication: Secondary | ICD-10-CM | POA: Diagnosis not present

## 2023-05-20 DIAGNOSIS — H9193 Unspecified hearing loss, bilateral: Secondary | ICD-10-CM | POA: Diagnosis not present

## 2023-05-20 DIAGNOSIS — G4733 Obstructive sleep apnea (adult) (pediatric): Secondary | ICD-10-CM | POA: Diagnosis not present

## 2023-05-20 DIAGNOSIS — S7291XD Unspecified fracture of right femur, subsequent encounter for closed fracture with routine healing: Secondary | ICD-10-CM | POA: Diagnosis not present

## 2023-05-20 DIAGNOSIS — F3340 Major depressive disorder, recurrent, in remission, unspecified: Secondary | ICD-10-CM | POA: Diagnosis not present

## 2023-05-20 DIAGNOSIS — C50412 Malignant neoplasm of upper-outer quadrant of left female breast: Secondary | ICD-10-CM | POA: Diagnosis not present

## 2023-05-21 DIAGNOSIS — S82831D Other fracture of upper and lower end of right fibula, subsequent encounter for closed fracture with routine healing: Secondary | ICD-10-CM | POA: Diagnosis not present

## 2023-05-21 DIAGNOSIS — S72491D Other fracture of lower end of right femur, subsequent encounter for closed fracture with routine healing: Secondary | ICD-10-CM | POA: Diagnosis not present

## 2023-05-21 DIAGNOSIS — F3340 Major depressive disorder, recurrent, in remission, unspecified: Secondary | ICD-10-CM | POA: Diagnosis not present

## 2023-05-21 DIAGNOSIS — K7460 Unspecified cirrhosis of liver: Secondary | ICD-10-CM | POA: Diagnosis not present

## 2023-05-21 DIAGNOSIS — S7291XD Unspecified fracture of right femur, subsequent encounter for closed fracture with routine healing: Secondary | ICD-10-CM | POA: Diagnosis not present

## 2023-05-21 DIAGNOSIS — E039 Hypothyroidism, unspecified: Secondary | ICD-10-CM | POA: Diagnosis not present

## 2023-05-21 DIAGNOSIS — E1142 Type 2 diabetes mellitus with diabetic polyneuropathy: Secondary | ICD-10-CM | POA: Diagnosis not present

## 2023-05-21 DIAGNOSIS — E785 Hyperlipidemia, unspecified: Secondary | ICD-10-CM | POA: Diagnosis not present

## 2023-05-21 DIAGNOSIS — M6281 Muscle weakness (generalized): Secondary | ICD-10-CM | POA: Diagnosis not present

## 2023-05-21 DIAGNOSIS — F419 Anxiety disorder, unspecified: Secondary | ICD-10-CM | POA: Diagnosis not present

## 2023-05-21 DIAGNOSIS — C50412 Malignant neoplasm of upper-outer quadrant of left female breast: Secondary | ICD-10-CM | POA: Diagnosis not present

## 2023-05-21 DIAGNOSIS — E1169 Type 2 diabetes mellitus with other specified complication: Secondary | ICD-10-CM | POA: Diagnosis not present

## 2023-05-23 ENCOUNTER — Telehealth: Payer: Self-pay | Admitting: Internal Medicine

## 2023-05-23 DIAGNOSIS — K7460 Unspecified cirrhosis of liver: Secondary | ICD-10-CM | POA: Diagnosis not present

## 2023-05-23 DIAGNOSIS — S7292XD Unspecified fracture of left femur, subsequent encounter for closed fracture with routine healing: Secondary | ICD-10-CM | POA: Diagnosis not present

## 2023-05-23 DIAGNOSIS — E1169 Type 2 diabetes mellitus with other specified complication: Secondary | ICD-10-CM | POA: Diagnosis not present

## 2023-05-23 DIAGNOSIS — F3340 Major depressive disorder, recurrent, in remission, unspecified: Secondary | ICD-10-CM | POA: Diagnosis not present

## 2023-05-23 DIAGNOSIS — E1142 Type 2 diabetes mellitus with diabetic polyneuropathy: Secondary | ICD-10-CM | POA: Diagnosis not present

## 2023-05-23 DIAGNOSIS — H9193 Unspecified hearing loss, bilateral: Secondary | ICD-10-CM | POA: Diagnosis not present

## 2023-05-23 DIAGNOSIS — E785 Hyperlipidemia, unspecified: Secondary | ICD-10-CM | POA: Diagnosis not present

## 2023-05-23 DIAGNOSIS — F419 Anxiety disorder, unspecified: Secondary | ICD-10-CM | POA: Diagnosis not present

## 2023-05-23 DIAGNOSIS — S7291XD Unspecified fracture of right femur, subsequent encounter for closed fracture with routine healing: Secondary | ICD-10-CM | POA: Diagnosis not present

## 2023-05-23 DIAGNOSIS — C50412 Malignant neoplasm of upper-outer quadrant of left female breast: Secondary | ICD-10-CM | POA: Diagnosis not present

## 2023-05-23 DIAGNOSIS — G4733 Obstructive sleep apnea (adult) (pediatric): Secondary | ICD-10-CM | POA: Diagnosis not present

## 2023-05-23 DIAGNOSIS — E039 Hypothyroidism, unspecified: Secondary | ICD-10-CM | POA: Diagnosis not present

## 2023-05-23 NOTE — Telephone Encounter (Signed)
 Dr Lydia Sams:  [name] [name], Daughter in law [name] called said she has fell and broke her femur waiting to have surgery.

## 2023-05-24 DIAGNOSIS — G4733 Obstructive sleep apnea (adult) (pediatric): Secondary | ICD-10-CM | POA: Diagnosis not present

## 2023-05-24 DIAGNOSIS — E785 Hyperlipidemia, unspecified: Secondary | ICD-10-CM | POA: Diagnosis not present

## 2023-05-24 DIAGNOSIS — H9193 Unspecified hearing loss, bilateral: Secondary | ICD-10-CM | POA: Diagnosis not present

## 2023-05-24 DIAGNOSIS — S7291XD Unspecified fracture of right femur, subsequent encounter for closed fracture with routine healing: Secondary | ICD-10-CM | POA: Diagnosis not present

## 2023-05-24 DIAGNOSIS — F419 Anxiety disorder, unspecified: Secondary | ICD-10-CM | POA: Diagnosis not present

## 2023-05-24 DIAGNOSIS — K7460 Unspecified cirrhosis of liver: Secondary | ICD-10-CM | POA: Diagnosis not present

## 2023-05-24 DIAGNOSIS — S7292XD Unspecified fracture of left femur, subsequent encounter for closed fracture with routine healing: Secondary | ICD-10-CM | POA: Diagnosis not present

## 2023-05-24 DIAGNOSIS — E039 Hypothyroidism, unspecified: Secondary | ICD-10-CM | POA: Diagnosis not present

## 2023-05-24 DIAGNOSIS — E1142 Type 2 diabetes mellitus with diabetic polyneuropathy: Secondary | ICD-10-CM | POA: Diagnosis not present

## 2023-05-24 DIAGNOSIS — F3340 Major depressive disorder, recurrent, in remission, unspecified: Secondary | ICD-10-CM | POA: Diagnosis not present

## 2023-05-24 DIAGNOSIS — C50412 Malignant neoplasm of upper-outer quadrant of left female breast: Secondary | ICD-10-CM | POA: Diagnosis not present

## 2023-05-24 DIAGNOSIS — E1169 Type 2 diabetes mellitus with other specified complication: Secondary | ICD-10-CM | POA: Diagnosis not present

## 2023-05-26 DIAGNOSIS — E039 Hypothyroidism, unspecified: Secondary | ICD-10-CM | POA: Diagnosis not present

## 2023-05-26 DIAGNOSIS — Z794 Long term (current) use of insulin: Secondary | ICD-10-CM | POA: Diagnosis not present

## 2023-05-26 DIAGNOSIS — F419 Anxiety disorder, unspecified: Secondary | ICD-10-CM | POA: Diagnosis not present

## 2023-05-26 DIAGNOSIS — M6281 Muscle weakness (generalized): Secondary | ICD-10-CM | POA: Diagnosis not present

## 2023-05-26 DIAGNOSIS — E1169 Type 2 diabetes mellitus with other specified complication: Secondary | ICD-10-CM | POA: Diagnosis not present

## 2023-05-26 DIAGNOSIS — F3340 Major depressive disorder, recurrent, in remission, unspecified: Secondary | ICD-10-CM | POA: Diagnosis not present

## 2023-05-26 DIAGNOSIS — S82831D Other fracture of upper and lower end of right fibula, subsequent encounter for closed fracture with routine healing: Secondary | ICD-10-CM | POA: Diagnosis not present

## 2023-05-26 DIAGNOSIS — E1142 Type 2 diabetes mellitus with diabetic polyneuropathy: Secondary | ICD-10-CM | POA: Diagnosis not present

## 2023-05-26 DIAGNOSIS — K7460 Unspecified cirrhosis of liver: Secondary | ICD-10-CM | POA: Diagnosis not present

## 2023-05-26 DIAGNOSIS — I10 Essential (primary) hypertension: Secondary | ICD-10-CM | POA: Diagnosis not present

## 2023-05-26 DIAGNOSIS — C50412 Malignant neoplasm of upper-outer quadrant of left female breast: Secondary | ICD-10-CM | POA: Diagnosis not present

## 2023-05-26 DIAGNOSIS — E785 Hyperlipidemia, unspecified: Secondary | ICD-10-CM | POA: Diagnosis not present

## 2023-05-26 DIAGNOSIS — S7291XD Unspecified fracture of right femur, subsequent encounter for closed fracture with routine healing: Secondary | ICD-10-CM | POA: Diagnosis not present

## 2023-05-26 DIAGNOSIS — S72491D Other fracture of lower end of right femur, subsequent encounter for closed fracture with routine healing: Secondary | ICD-10-CM | POA: Diagnosis not present

## 2023-05-26 DIAGNOSIS — Z6841 Body Mass Index (BMI) 40.0 and over, adult: Secondary | ICD-10-CM | POA: Diagnosis not present

## 2023-05-27 ENCOUNTER — Telehealth: Payer: Self-pay | Admitting: Internal Medicine

## 2023-05-27 NOTE — Telephone Encounter (Signed)
 Amy from [facility] returned call Aid 2 times a week for 8 weeks   "We'll start with that and see how that goes"

## 2023-05-27 NOTE — Telephone Encounter (Signed)
 Copied from CRM [id number]. Topic: Clinical - Home Health Verbal Orders >> May 27, 2023  1:27 PM Lizabeth Riggs wrote: Caller/Agency: Amy with [facility] Callback Number: [phone] - Phone has secured voicemail Service Requested: Skilled Nursing Frequency: Nursing 1 time weekly for 9 weeks;  Any new concerns about the patient? No

## 2023-05-30 DIAGNOSIS — E785 Hyperlipidemia, unspecified: Secondary | ICD-10-CM | POA: Diagnosis not present

## 2023-05-30 DIAGNOSIS — D630 Anemia in neoplastic disease: Secondary | ICD-10-CM | POA: Diagnosis not present

## 2023-05-30 DIAGNOSIS — Z6841 Body Mass Index (BMI) 40.0 and over, adult: Secondary | ICD-10-CM | POA: Diagnosis not present

## 2023-05-30 DIAGNOSIS — R296 Repeated falls: Secondary | ICD-10-CM | POA: Diagnosis not present

## 2023-05-30 DIAGNOSIS — G4733 Obstructive sleep apnea (adult) (pediatric): Secondary | ICD-10-CM | POA: Diagnosis not present

## 2023-05-30 DIAGNOSIS — Z7984 Long term (current) use of oral hypoglycemic drugs: Secondary | ICD-10-CM | POA: Diagnosis not present

## 2023-05-30 DIAGNOSIS — M9711XD Periprosthetic fracture around internal prosthetic right knee joint, subsequent encounter: Secondary | ICD-10-CM | POA: Diagnosis not present

## 2023-05-30 DIAGNOSIS — D696 Thrombocytopenia, unspecified: Secondary | ICD-10-CM | POA: Diagnosis not present

## 2023-05-30 DIAGNOSIS — S82831D Other fracture of upper and lower end of right fibula, subsequent encounter for closed fracture with routine healing: Secondary | ICD-10-CM | POA: Diagnosis not present

## 2023-05-30 DIAGNOSIS — L121 Cicatricial pemphigoid: Secondary | ICD-10-CM | POA: Diagnosis not present

## 2023-05-30 DIAGNOSIS — M150 Primary generalized (osteo)arthritis: Secondary | ICD-10-CM | POA: Diagnosis not present

## 2023-05-30 DIAGNOSIS — K7460 Unspecified cirrhosis of liver: Secondary | ICD-10-CM | POA: Diagnosis not present

## 2023-05-30 DIAGNOSIS — I10 Essential (primary) hypertension: Secondary | ICD-10-CM | POA: Diagnosis not present

## 2023-05-30 DIAGNOSIS — E039 Hypothyroidism, unspecified: Secondary | ICD-10-CM | POA: Diagnosis not present

## 2023-05-30 DIAGNOSIS — M109 Gout, unspecified: Secondary | ICD-10-CM | POA: Diagnosis not present

## 2023-05-30 DIAGNOSIS — C50412 Malignant neoplasm of upper-outer quadrant of left female breast: Secondary | ICD-10-CM | POA: Diagnosis not present

## 2023-05-30 DIAGNOSIS — Z452 Encounter for adjustment and management of vascular access device: Secondary | ICD-10-CM | POA: Diagnosis not present

## 2023-05-30 DIAGNOSIS — F419 Anxiety disorder, unspecified: Secondary | ICD-10-CM | POA: Diagnosis not present

## 2023-05-30 DIAGNOSIS — M9701XD Periprosthetic fracture around internal prosthetic right hip joint, subsequent encounter: Secondary | ICD-10-CM | POA: Diagnosis not present

## 2023-05-30 DIAGNOSIS — E1169 Type 2 diabetes mellitus with other specified complication: Secondary | ICD-10-CM | POA: Diagnosis not present

## 2023-05-30 DIAGNOSIS — F3340 Major depressive disorder, recurrent, in remission, unspecified: Secondary | ICD-10-CM | POA: Diagnosis not present

## 2023-05-30 DIAGNOSIS — S72491D Other fracture of lower end of right femur, subsequent encounter for closed fracture with routine healing: Secondary | ICD-10-CM | POA: Diagnosis not present

## 2023-05-30 DIAGNOSIS — E1142 Type 2 diabetes mellitus with diabetic polyneuropathy: Secondary | ICD-10-CM | POA: Diagnosis not present

## 2023-05-30 DIAGNOSIS — E66813 Obesity, class 3: Secondary | ICD-10-CM | POA: Diagnosis not present

## 2023-05-30 DIAGNOSIS — Z794 Long term (current) use of insulin: Secondary | ICD-10-CM | POA: Diagnosis not present

## 2023-05-30 NOTE — Telephone Encounter (Signed)
 Spoke with Amy to approve orders.

## 2023-06-01 DIAGNOSIS — M9711XD Periprosthetic fracture around internal prosthetic right knee joint, subsequent encounter: Secondary | ICD-10-CM | POA: Diagnosis not present

## 2023-06-01 DIAGNOSIS — S82831D Other fracture of upper and lower end of right fibula, subsequent encounter for closed fracture with routine healing: Secondary | ICD-10-CM | POA: Diagnosis not present

## 2023-06-01 DIAGNOSIS — M9701XD Periprosthetic fracture around internal prosthetic right hip joint, subsequent encounter: Secondary | ICD-10-CM | POA: Diagnosis not present

## 2023-06-01 DIAGNOSIS — E1142 Type 2 diabetes mellitus with diabetic polyneuropathy: Secondary | ICD-10-CM | POA: Diagnosis not present

## 2023-06-01 DIAGNOSIS — S72491D Other fracture of lower end of right femur, subsequent encounter for closed fracture with routine healing: Secondary | ICD-10-CM | POA: Diagnosis not present

## 2023-06-01 DIAGNOSIS — E1169 Type 2 diabetes mellitus with other specified complication: Secondary | ICD-10-CM | POA: Diagnosis not present

## 2023-06-02 DIAGNOSIS — M9711XD Periprosthetic fracture around internal prosthetic right knee joint, subsequent encounter: Secondary | ICD-10-CM | POA: Diagnosis not present

## 2023-06-02 DIAGNOSIS — S72491D Other fracture of lower end of right femur, subsequent encounter for closed fracture with routine healing: Secondary | ICD-10-CM | POA: Diagnosis not present

## 2023-06-02 DIAGNOSIS — M9701XD Periprosthetic fracture around internal prosthetic right hip joint, subsequent encounter: Secondary | ICD-10-CM | POA: Diagnosis not present

## 2023-06-02 DIAGNOSIS — S82831D Other fracture of upper and lower end of right fibula, subsequent encounter for closed fracture with routine healing: Secondary | ICD-10-CM | POA: Diagnosis not present

## 2023-06-02 DIAGNOSIS — E1169 Type 2 diabetes mellitus with other specified complication: Secondary | ICD-10-CM | POA: Diagnosis not present

## 2023-06-02 DIAGNOSIS — E1142 Type 2 diabetes mellitus with diabetic polyneuropathy: Secondary | ICD-10-CM | POA: Diagnosis not present

## 2023-06-06 ENCOUNTER — Other Ambulatory Visit: Payer: Self-pay | Admitting: Internal Medicine

## 2023-06-06 ENCOUNTER — Ambulatory Visit: Payer: Self-pay

## 2023-06-06 DIAGNOSIS — M9711XD Periprosthetic fracture around internal prosthetic right knee joint, subsequent encounter: Secondary | ICD-10-CM | POA: Diagnosis not present

## 2023-06-06 DIAGNOSIS — N390 Urinary tract infection, site not specified: Secondary | ICD-10-CM | POA: Diagnosis not present

## 2023-06-06 DIAGNOSIS — S82831D Other fracture of upper and lower end of right fibula, subsequent encounter for closed fracture with routine healing: Secondary | ICD-10-CM | POA: Diagnosis not present

## 2023-06-06 DIAGNOSIS — E1169 Type 2 diabetes mellitus with other specified complication: Secondary | ICD-10-CM | POA: Diagnosis not present

## 2023-06-06 DIAGNOSIS — M9701XD Periprosthetic fracture around internal prosthetic right hip joint, subsequent encounter: Secondary | ICD-10-CM | POA: Diagnosis not present

## 2023-06-06 DIAGNOSIS — S72491D Other fracture of lower end of right femur, subsequent encounter for closed fracture with routine healing: Secondary | ICD-10-CM | POA: Diagnosis not present

## 2023-06-06 DIAGNOSIS — E1142 Type 2 diabetes mellitus with diabetic polyneuropathy: Secondary | ICD-10-CM | POA: Diagnosis not present

## 2023-06-06 NOTE — Telephone Encounter (Signed)
 Copied from CRM [id number]. Topic: Clinical - Home Health Verbal Orders >> Mcatee 5, 2025  8:36 AM Baldemar Lev wrote: Caller/Agency: Amy from [facility] Number: [phone] Service Requested: Skilled Nursing Frequency:  Any new concerns about the patient? Yes.   Possible UTI  Requesting urine culture and urinalysis.   Symptoms include: pain, lower abdominal pain, and pain with urination. Pt just called this morning, urgency and frequency. And foul odor.   Sending Red Word symptoms to Nurse Triage.

## 2023-06-06 NOTE — Telephone Encounter (Signed)
 Verbal orders given

## 2023-06-06 NOTE — Telephone Encounter (Signed)
 Patient called needs an order sent for urine culture and urinalysis. Patient is home bound, nurse on the way to her house today

## 2023-06-06 NOTE — Telephone Encounter (Signed)
 Chief Complaint: Lower abd pain Symptoms: dysuria, odor, frequency, urgency Frequency: x 1 week Pertinent Negatives: Patient denies fever, hematuria Disposition: [] ED /[] Urgent Care (no appt availability in office) / [x] Appointment(In office/virtual)/ []  [facility]/ [] Home Care/ [x] Refused Recommended Disposition /[] [facility]/ []  Follow-up with PCP Additional Notes: Pt reports she has been experiencing UTI symptoms for about 1 week noting dysuria, lower abd pain, urgency, frequency. Denies fever, hematuria. OV recommended, pt notes she is homebound and requests HH orders for specimen collection. This RN educated pt on home care, new-worsening symptoms, when to call back/seek emergent care. Pt verbalized understanding and agrees to plan.    Copied from CRM [id number]. Topic: Clinical - Red Word Triage >> Opfer 5, 2025  8:42 AM Baldemar Lev wrote: Red Word that prompted transfer to Nurse Triage: Lower Abdominal Pain  Home Health Nurse called to report that the patient has UTI symptoms. Not currently with patient, instructed by Nurse to send to Triage as a message so NT can contact pt directly.    Caller/Agency: Amy from [facility] Number: [phone]  Possible UTI  Requesting urine culture and urinalysis.   Symptoms include: pain, lower abdominal pain, and pain with urination. Pt just called this morning, urgency and frequency. And foul odor.   Sending Red Word symptoms to Nurse Triage. Reason for Disposition  [1] MILD-MODERATE pain AND [2] constant AND [3] present > 2 hours  Answer Assessment - Initial Assessment Questions 1. LOCATION: "Where does it hurt?"      Lower abd 3. ONSET: "When did the pain begin?" (e.g., minutes, hours or days ago)      X 1 week 5. PATTERN "Does the pain come and go, or is it constant?"    - If it comes and goes: "How long does it last?" "Do you have pain now?"     (Note: Comes and goes means the pain is intermittent. It  goes away completely between bouts.)    - If constant: "Is it getting better, staying the same, or getting worse?"      (Note: Constant means the pain never goes away completely; most serious pain is constant and gets worse.)      Stabbing burning pain with urination 6. SEVERITY: "How bad is the pain?"  (e.g., Scale 1-10; mild, moderate, or severe)    - MILD (1-3): Doesn't interfere with normal activities, abdomen soft and not tender to touch.     - MODERATE (4-7): Interferes with normal activities or awakens from sleep, abdomen tender to touch.     - SEVERE (8-10): Excruciating pain, doubled over, unable to do any normal activities.       4/10 8. CAUSE: "What do you think is causing the stomach pain?"     Possible UTI 10. OTHER SYMPTOMS: "Do you have any other symptoms?" (e.g., back pain, diarrhea, fever, urination pain, vomiting)       Urinary frequency,urgency, dysuria, odor  Protocols used: Abdominal Pain - Female-A-AH

## 2023-06-07 DIAGNOSIS — S82831D Other fracture of upper and lower end of right fibula, subsequent encounter for closed fracture with routine healing: Secondary | ICD-10-CM | POA: Diagnosis not present

## 2023-06-07 DIAGNOSIS — S72491D Other fracture of lower end of right femur, subsequent encounter for closed fracture with routine healing: Secondary | ICD-10-CM | POA: Diagnosis not present

## 2023-06-07 DIAGNOSIS — E1169 Type 2 diabetes mellitus with other specified complication: Secondary | ICD-10-CM | POA: Diagnosis not present

## 2023-06-07 DIAGNOSIS — M9701XD Periprosthetic fracture around internal prosthetic right hip joint, subsequent encounter: Secondary | ICD-10-CM | POA: Diagnosis not present

## 2023-06-07 DIAGNOSIS — M9711XD Periprosthetic fracture around internal prosthetic right knee joint, subsequent encounter: Secondary | ICD-10-CM | POA: Diagnosis not present

## 2023-06-07 DIAGNOSIS — E1142 Type 2 diabetes mellitus with diabetic polyneuropathy: Secondary | ICD-10-CM | POA: Diagnosis not present

## 2023-06-08 ENCOUNTER — Other Ambulatory Visit: Payer: Self-pay | Admitting: Internal Medicine

## 2023-06-08 ENCOUNTER — Telehealth: Payer: Self-pay

## 2023-06-08 DIAGNOSIS — N3000 Acute cystitis without hematuria: Secondary | ICD-10-CM

## 2023-06-08 MED ORDER — SULFAMETHOXAZOLE-TRIMETHOPRIM 800-160 MG PO TABS: 1.0000 | ORAL_TABLET | Freq: Two times a day (BID) | ORAL | 0 refills | Status: DC

## 2023-06-08 NOTE — Telephone Encounter (Signed)
 Pt states she has an UTI and she still has not heard anything from anybody, the pt is home bound and she gave her specimen sample to her home nurse.  Callback # [phone]

## 2023-06-08 NOTE — Telephone Encounter (Signed)
 Copied from CRM [id number]. Topic: Clinical - Medication Question >> Legere 6, 2025  4:20 PM Zipporah Him wrote: Reason for CRM: Patient is wanting to see if she can get something sent over to help her with the UTI. AZO over the counter is not helping with her extreme urgency to go to the bathroom and she has trouble getting up and down. Home health nurse amy called, states you can reach her back or the patient with updates.

## 2023-06-08 NOTE — Telephone Encounter (Signed)
Unable to leave a message for pt.

## 2023-06-09 DIAGNOSIS — M9711XD Periprosthetic fracture around internal prosthetic right knee joint, subsequent encounter: Secondary | ICD-10-CM | POA: Diagnosis not present

## 2023-06-09 DIAGNOSIS — K7460 Unspecified cirrhosis of liver: Secondary | ICD-10-CM | POA: Diagnosis not present

## 2023-06-09 DIAGNOSIS — E785 Hyperlipidemia, unspecified: Secondary | ICD-10-CM | POA: Diagnosis not present

## 2023-06-09 DIAGNOSIS — S72491D Other fracture of lower end of right femur, subsequent encounter for closed fracture with routine healing: Secondary | ICD-10-CM | POA: Diagnosis not present

## 2023-06-09 DIAGNOSIS — E66813 Obesity, class 3: Secondary | ICD-10-CM | POA: Diagnosis not present

## 2023-06-09 DIAGNOSIS — F3340 Major depressive disorder, recurrent, in remission, unspecified: Secondary | ICD-10-CM | POA: Diagnosis not present

## 2023-06-09 DIAGNOSIS — D696 Thrombocytopenia, unspecified: Secondary | ICD-10-CM | POA: Diagnosis not present

## 2023-06-09 DIAGNOSIS — E1142 Type 2 diabetes mellitus with diabetic polyneuropathy: Secondary | ICD-10-CM | POA: Diagnosis not present

## 2023-06-09 DIAGNOSIS — M9701XD Periprosthetic fracture around internal prosthetic right hip joint, subsequent encounter: Secondary | ICD-10-CM | POA: Diagnosis not present

## 2023-06-09 DIAGNOSIS — S82831D Other fracture of upper and lower end of right fibula, subsequent encounter for closed fracture with routine healing: Secondary | ICD-10-CM | POA: Diagnosis not present

## 2023-06-09 DIAGNOSIS — E1169 Type 2 diabetes mellitus with other specified complication: Secondary | ICD-10-CM | POA: Diagnosis not present

## 2023-06-09 DIAGNOSIS — C50412 Malignant neoplasm of upper-outer quadrant of left female breast: Secondary | ICD-10-CM | POA: Diagnosis not present

## 2023-06-13 ENCOUNTER — Encounter: Payer: Self-pay | Admitting: Internal Medicine

## 2023-06-13 ENCOUNTER — Ambulatory Visit (INDEPENDENT_AMBULATORY_CARE_PROVIDER_SITE_OTHER): Admitting: Orthopaedic Surgery

## 2023-06-13 ENCOUNTER — Other Ambulatory Visit (INDEPENDENT_AMBULATORY_CARE_PROVIDER_SITE_OTHER): Payer: Self-pay

## 2023-06-13 ENCOUNTER — Encounter: Payer: Self-pay | Admitting: Orthopaedic Surgery

## 2023-06-13 DIAGNOSIS — Z8781 Personal history of (healed) traumatic fracture: Secondary | ICD-10-CM

## 2023-06-13 DIAGNOSIS — S82831D Other fracture of upper and lower end of right fibula, subsequent encounter for closed fracture with routine healing: Secondary | ICD-10-CM | POA: Diagnosis not present

## 2023-06-13 DIAGNOSIS — Z9889 Other specified postprocedural states: Secondary | ICD-10-CM | POA: Diagnosis not present

## 2023-06-13 DIAGNOSIS — S72491D Other fracture of lower end of right femur, subsequent encounter for closed fracture with routine healing: Secondary | ICD-10-CM | POA: Diagnosis not present

## 2023-06-13 DIAGNOSIS — E1169 Type 2 diabetes mellitus with other specified complication: Secondary | ICD-10-CM | POA: Diagnosis not present

## 2023-06-13 DIAGNOSIS — M9701XD Periprosthetic fracture around internal prosthetic right hip joint, subsequent encounter: Secondary | ICD-10-CM | POA: Diagnosis not present

## 2023-06-13 DIAGNOSIS — M9711XD Periprosthetic fracture around internal prosthetic right knee joint, subsequent encounter: Secondary | ICD-10-CM | POA: Diagnosis not present

## 2023-06-13 DIAGNOSIS — E1142 Type 2 diabetes mellitus with diabetic polyneuropathy: Secondary | ICD-10-CM | POA: Diagnosis not present

## 2023-06-13 NOTE — Progress Notes (Signed)
 The patient is now 6 weeks status post open reduction/intra fixation of a right distal femur periprosthetic fracture around a knee replacement.  She has been compliant with nonweightbearing and we have had her out of her brace.  She is at home with home therapy working with her.  She is 73 years old.  On examination her incisions of healed nicely over the right lateral femur.  She has actually good range of motion of her right knee and denies any significant pain.  2 views of the right femur show that the fracture is in good alignment and the replacement is intact as well as the hardware on the lateral aspect of the femur.  Obviously the fracture lines are still visible and there is been slight interval healing.  She has been nonweightbearing and compliant with nonweightbearing and we will advance her to just touchdown weightbearing only but to back off of it is hurting.  Will see her back in 4 weeks.  I would like an AP and lateral of her right knee at that visit mainly focused at the fracture site.  We can see at that point about advancing her weightbearing status depending on her healing.

## 2023-06-14 ENCOUNTER — Ambulatory Visit: Payer: Self-pay

## 2023-06-14 DIAGNOSIS — E1169 Type 2 diabetes mellitus with other specified complication: Secondary | ICD-10-CM | POA: Diagnosis not present

## 2023-06-14 DIAGNOSIS — M9711XD Periprosthetic fracture around internal prosthetic right knee joint, subsequent encounter: Secondary | ICD-10-CM | POA: Diagnosis not present

## 2023-06-14 DIAGNOSIS — S82831D Other fracture of upper and lower end of right fibula, subsequent encounter for closed fracture with routine healing: Secondary | ICD-10-CM | POA: Diagnosis not present

## 2023-06-14 DIAGNOSIS — M9701XD Periprosthetic fracture around internal prosthetic right hip joint, subsequent encounter: Secondary | ICD-10-CM | POA: Diagnosis not present

## 2023-06-14 DIAGNOSIS — S72491D Other fracture of lower end of right femur, subsequent encounter for closed fracture with routine healing: Secondary | ICD-10-CM | POA: Diagnosis not present

## 2023-06-14 DIAGNOSIS — E1142 Type 2 diabetes mellitus with diabetic polyneuropathy: Secondary | ICD-10-CM | POA: Diagnosis not present

## 2023-06-14 NOTE — Telephone Encounter (Signed)
 3rd attempt, "call cannot be completed as dialed" Routing to clinic for follow up.

## 2023-06-14 NOTE — Telephone Encounter (Signed)
 2nd attempt, "call cannot be completed as dialed"

## 2023-06-14 NOTE — Telephone Encounter (Signed)
 This RN attempted to contact patient. (1st attempt). 'Call cannot be completed as dialed'.

## 2023-06-14 NOTE — Telephone Encounter (Signed)
 Copied from CRM [id number]. Topic: General - Other >> Revak 13, 2025  2:14 PM Tiffany S wrote: Reason for CRM: Patient is on antibiotic for UTI patient is still having symptoms Nurse wanted to know if he can call in another prescription   [phone]

## 2023-06-15 ENCOUNTER — Telehealth: Payer: Self-pay

## 2023-06-15 ENCOUNTER — Other Ambulatory Visit: Payer: Self-pay | Admitting: Internal Medicine

## 2023-06-15 ENCOUNTER — Telehealth: Payer: Self-pay | Admitting: Internal Medicine

## 2023-06-15 DIAGNOSIS — N3000 Acute cystitis without hematuria: Secondary | ICD-10-CM

## 2023-06-15 MED ORDER — CIPROFLOXACIN HCL 500 MG PO TABS: 500.0000 mg | ORAL_TABLET | Freq: Every day | ORAL | 0 refills | Status: AC

## 2023-06-15 NOTE — Telephone Encounter (Signed)
 Copied from CRM [id number]. Topic: General - Call Back - No Documentation >> Kimberlin 13, 2025  3:28 PM Ivette P wrote: Reason for CRM: [name] [name] called in requesting to speak to CAL and would not disclose any information.  Attempted to call CAL 2 times, no answer.   [name] [name] is not on the DPR and no information was released to her. However is requesting clinic call pt.   Pt callback [phone]

## 2023-06-15 NOTE — Telephone Encounter (Signed)
 Daughter in law states she is still having the same uti symptoms but she is not mobile due to a femor fracture. She is willing to being a urine back for testing. She is on her last day of antibiotics and the symptoms have not improved and they are concerned she needs a different antibiotic.  Pls advise

## 2023-06-15 NOTE — Telephone Encounter (Signed)
 Error

## 2023-06-15 NOTE — Telephone Encounter (Signed)
Pt informed

## 2023-06-15 NOTE — Telephone Encounter (Signed)
 Spoke with the pt let her know urine culture is needed, Dr. Lydia Sams sent in cipro  for now, however urine culture needs to be repeated.

## 2023-06-16 DIAGNOSIS — M9711XD Periprosthetic fracture around internal prosthetic right knee joint, subsequent encounter: Secondary | ICD-10-CM | POA: Diagnosis not present

## 2023-06-16 DIAGNOSIS — E1142 Type 2 diabetes mellitus with diabetic polyneuropathy: Secondary | ICD-10-CM | POA: Diagnosis not present

## 2023-06-16 DIAGNOSIS — S72491D Other fracture of lower end of right femur, subsequent encounter for closed fracture with routine healing: Secondary | ICD-10-CM | POA: Diagnosis not present

## 2023-06-16 DIAGNOSIS — S82831D Other fracture of upper and lower end of right fibula, subsequent encounter for closed fracture with routine healing: Secondary | ICD-10-CM | POA: Diagnosis not present

## 2023-06-16 DIAGNOSIS — E1169 Type 2 diabetes mellitus with other specified complication: Secondary | ICD-10-CM | POA: Diagnosis not present

## 2023-06-16 DIAGNOSIS — M9701XD Periprosthetic fracture around internal prosthetic right hip joint, subsequent encounter: Secondary | ICD-10-CM | POA: Diagnosis not present

## 2023-06-20 DIAGNOSIS — M9711XD Periprosthetic fracture around internal prosthetic right knee joint, subsequent encounter: Secondary | ICD-10-CM | POA: Diagnosis not present

## 2023-06-20 DIAGNOSIS — E1169 Type 2 diabetes mellitus with other specified complication: Secondary | ICD-10-CM | POA: Diagnosis not present

## 2023-06-20 DIAGNOSIS — E1142 Type 2 diabetes mellitus with diabetic polyneuropathy: Secondary | ICD-10-CM | POA: Diagnosis not present

## 2023-06-20 DIAGNOSIS — S72491D Other fracture of lower end of right femur, subsequent encounter for closed fracture with routine healing: Secondary | ICD-10-CM | POA: Diagnosis not present

## 2023-06-20 DIAGNOSIS — S82831D Other fracture of upper and lower end of right fibula, subsequent encounter for closed fracture with routine healing: Secondary | ICD-10-CM | POA: Diagnosis not present

## 2023-06-20 DIAGNOSIS — M9701XD Periprosthetic fracture around internal prosthetic right hip joint, subsequent encounter: Secondary | ICD-10-CM | POA: Diagnosis not present

## 2023-06-21 DIAGNOSIS — M9711XD Periprosthetic fracture around internal prosthetic right knee joint, subsequent encounter: Secondary | ICD-10-CM | POA: Diagnosis not present

## 2023-06-21 DIAGNOSIS — E1169 Type 2 diabetes mellitus with other specified complication: Secondary | ICD-10-CM | POA: Diagnosis not present

## 2023-06-21 DIAGNOSIS — M9701XD Periprosthetic fracture around internal prosthetic right hip joint, subsequent encounter: Secondary | ICD-10-CM | POA: Diagnosis not present

## 2023-06-21 DIAGNOSIS — S82831D Other fracture of upper and lower end of right fibula, subsequent encounter for closed fracture with routine healing: Secondary | ICD-10-CM | POA: Diagnosis not present

## 2023-06-21 DIAGNOSIS — E1142 Type 2 diabetes mellitus with diabetic polyneuropathy: Secondary | ICD-10-CM | POA: Diagnosis not present

## 2023-06-21 DIAGNOSIS — S72491D Other fracture of lower end of right femur, subsequent encounter for closed fracture with routine healing: Secondary | ICD-10-CM | POA: Diagnosis not present

## 2023-06-22 DIAGNOSIS — M9711XD Periprosthetic fracture around internal prosthetic right knee joint, subsequent encounter: Secondary | ICD-10-CM | POA: Diagnosis not present

## 2023-06-22 DIAGNOSIS — E1142 Type 2 diabetes mellitus with diabetic polyneuropathy: Secondary | ICD-10-CM | POA: Diagnosis not present

## 2023-06-22 DIAGNOSIS — S82831D Other fracture of upper and lower end of right fibula, subsequent encounter for closed fracture with routine healing: Secondary | ICD-10-CM | POA: Diagnosis not present

## 2023-06-22 DIAGNOSIS — S72491D Other fracture of lower end of right femur, subsequent encounter for closed fracture with routine healing: Secondary | ICD-10-CM | POA: Diagnosis not present

## 2023-06-22 DIAGNOSIS — E1169 Type 2 diabetes mellitus with other specified complication: Secondary | ICD-10-CM | POA: Diagnosis not present

## 2023-06-22 DIAGNOSIS — M9701XD Periprosthetic fracture around internal prosthetic right hip joint, subsequent encounter: Secondary | ICD-10-CM | POA: Diagnosis not present

## 2023-06-22 DIAGNOSIS — N390 Urinary tract infection, site not specified: Secondary | ICD-10-CM | POA: Diagnosis not present

## 2023-06-24 ENCOUNTER — Ambulatory Visit: Payer: Self-pay | Admitting: Internal Medicine

## 2023-06-24 DIAGNOSIS — S72491D Other fracture of lower end of right femur, subsequent encounter for closed fracture with routine healing: Secondary | ICD-10-CM | POA: Diagnosis not present

## 2023-06-24 DIAGNOSIS — M9701XD Periprosthetic fracture around internal prosthetic right hip joint, subsequent encounter: Secondary | ICD-10-CM | POA: Diagnosis not present

## 2023-06-24 DIAGNOSIS — E1142 Type 2 diabetes mellitus with diabetic polyneuropathy: Secondary | ICD-10-CM | POA: Diagnosis not present

## 2023-06-24 DIAGNOSIS — M9711XD Periprosthetic fracture around internal prosthetic right knee joint, subsequent encounter: Secondary | ICD-10-CM | POA: Diagnosis not present

## 2023-06-24 DIAGNOSIS — S82831D Other fracture of upper and lower end of right fibula, subsequent encounter for closed fracture with routine healing: Secondary | ICD-10-CM | POA: Diagnosis not present

## 2023-06-24 DIAGNOSIS — E1169 Type 2 diabetes mellitus with other specified complication: Secondary | ICD-10-CM | POA: Diagnosis not present

## 2023-06-29 DIAGNOSIS — E039 Hypothyroidism, unspecified: Secondary | ICD-10-CM | POA: Diagnosis not present

## 2023-06-29 DIAGNOSIS — L121 Cicatricial pemphigoid: Secondary | ICD-10-CM | POA: Diagnosis not present

## 2023-06-29 DIAGNOSIS — K7460 Unspecified cirrhosis of liver: Secondary | ICD-10-CM | POA: Diagnosis not present

## 2023-06-29 DIAGNOSIS — E1169 Type 2 diabetes mellitus with other specified complication: Secondary | ICD-10-CM | POA: Diagnosis not present

## 2023-06-29 DIAGNOSIS — F3340 Major depressive disorder, recurrent, in remission, unspecified: Secondary | ICD-10-CM | POA: Diagnosis not present

## 2023-06-29 DIAGNOSIS — Z6841 Body Mass Index (BMI) 40.0 and over, adult: Secondary | ICD-10-CM | POA: Diagnosis not present

## 2023-06-29 DIAGNOSIS — S82831D Other fracture of upper and lower end of right fibula, subsequent encounter for closed fracture with routine healing: Secondary | ICD-10-CM | POA: Diagnosis not present

## 2023-06-29 DIAGNOSIS — C50412 Malignant neoplasm of upper-outer quadrant of left female breast: Secondary | ICD-10-CM | POA: Diagnosis not present

## 2023-06-29 DIAGNOSIS — G4733 Obstructive sleep apnea (adult) (pediatric): Secondary | ICD-10-CM | POA: Diagnosis not present

## 2023-06-29 DIAGNOSIS — F419 Anxiety disorder, unspecified: Secondary | ICD-10-CM | POA: Diagnosis not present

## 2023-06-29 DIAGNOSIS — D696 Thrombocytopenia, unspecified: Secondary | ICD-10-CM | POA: Diagnosis not present

## 2023-06-29 DIAGNOSIS — E66813 Obesity, class 3: Secondary | ICD-10-CM | POA: Diagnosis not present

## 2023-06-29 DIAGNOSIS — M9711XD Periprosthetic fracture around internal prosthetic right knee joint, subsequent encounter: Secondary | ICD-10-CM | POA: Diagnosis not present

## 2023-06-29 DIAGNOSIS — E785 Hyperlipidemia, unspecified: Secondary | ICD-10-CM | POA: Diagnosis not present

## 2023-06-29 DIAGNOSIS — I10 Essential (primary) hypertension: Secondary | ICD-10-CM | POA: Diagnosis not present

## 2023-06-29 DIAGNOSIS — M9701XD Periprosthetic fracture around internal prosthetic right hip joint, subsequent encounter: Secondary | ICD-10-CM | POA: Diagnosis not present

## 2023-06-29 DIAGNOSIS — Z452 Encounter for adjustment and management of vascular access device: Secondary | ICD-10-CM | POA: Diagnosis not present

## 2023-06-29 DIAGNOSIS — R296 Repeated falls: Secondary | ICD-10-CM | POA: Diagnosis not present

## 2023-06-29 DIAGNOSIS — M150 Primary generalized (osteo)arthritis: Secondary | ICD-10-CM | POA: Diagnosis not present

## 2023-06-29 DIAGNOSIS — Z794 Long term (current) use of insulin: Secondary | ICD-10-CM | POA: Diagnosis not present

## 2023-06-29 DIAGNOSIS — S72491D Other fracture of lower end of right femur, subsequent encounter for closed fracture with routine healing: Secondary | ICD-10-CM | POA: Diagnosis not present

## 2023-06-29 DIAGNOSIS — E1142 Type 2 diabetes mellitus with diabetic polyneuropathy: Secondary | ICD-10-CM | POA: Diagnosis not present

## 2023-06-29 DIAGNOSIS — M109 Gout, unspecified: Secondary | ICD-10-CM | POA: Diagnosis not present

## 2023-06-29 DIAGNOSIS — D630 Anemia in neoplastic disease: Secondary | ICD-10-CM | POA: Diagnosis not present

## 2023-06-29 DIAGNOSIS — Z7984 Long term (current) use of oral hypoglycemic drugs: Secondary | ICD-10-CM | POA: Diagnosis not present

## 2023-07-03 ENCOUNTER — Other Ambulatory Visit: Payer: Self-pay | Admitting: Internal Medicine

## 2023-07-04 DIAGNOSIS — E1142 Type 2 diabetes mellitus with diabetic polyneuropathy: Secondary | ICD-10-CM | POA: Diagnosis not present

## 2023-07-04 DIAGNOSIS — S82831D Other fracture of upper and lower end of right fibula, subsequent encounter for closed fracture with routine healing: Secondary | ICD-10-CM | POA: Diagnosis not present

## 2023-07-04 DIAGNOSIS — M9701XD Periprosthetic fracture around internal prosthetic right hip joint, subsequent encounter: Secondary | ICD-10-CM | POA: Diagnosis not present

## 2023-07-04 DIAGNOSIS — S72491D Other fracture of lower end of right femur, subsequent encounter for closed fracture with routine healing: Secondary | ICD-10-CM | POA: Diagnosis not present

## 2023-07-04 DIAGNOSIS — M9711XD Periprosthetic fracture around internal prosthetic right knee joint, subsequent encounter: Secondary | ICD-10-CM | POA: Diagnosis not present

## 2023-07-04 DIAGNOSIS — E1169 Type 2 diabetes mellitus with other specified complication: Secondary | ICD-10-CM | POA: Diagnosis not present

## 2023-07-06 DIAGNOSIS — S82831D Other fracture of upper and lower end of right fibula, subsequent encounter for closed fracture with routine healing: Secondary | ICD-10-CM | POA: Diagnosis not present

## 2023-07-06 DIAGNOSIS — E1169 Type 2 diabetes mellitus with other specified complication: Secondary | ICD-10-CM | POA: Diagnosis not present

## 2023-07-06 DIAGNOSIS — E1142 Type 2 diabetes mellitus with diabetic polyneuropathy: Secondary | ICD-10-CM | POA: Diagnosis not present

## 2023-07-06 DIAGNOSIS — M9711XD Periprosthetic fracture around internal prosthetic right knee joint, subsequent encounter: Secondary | ICD-10-CM | POA: Diagnosis not present

## 2023-07-06 DIAGNOSIS — M9701XD Periprosthetic fracture around internal prosthetic right hip joint, subsequent encounter: Secondary | ICD-10-CM | POA: Diagnosis not present

## 2023-07-06 DIAGNOSIS — S72491D Other fracture of lower end of right femur, subsequent encounter for closed fracture with routine healing: Secondary | ICD-10-CM | POA: Diagnosis not present

## 2023-07-07 DIAGNOSIS — M9711XD Periprosthetic fracture around internal prosthetic right knee joint, subsequent encounter: Secondary | ICD-10-CM | POA: Diagnosis not present

## 2023-07-07 DIAGNOSIS — E1169 Type 2 diabetes mellitus with other specified complication: Secondary | ICD-10-CM | POA: Diagnosis not present

## 2023-07-07 DIAGNOSIS — S82831D Other fracture of upper and lower end of right fibula, subsequent encounter for closed fracture with routine healing: Secondary | ICD-10-CM | POA: Diagnosis not present

## 2023-07-07 DIAGNOSIS — E1142 Type 2 diabetes mellitus with diabetic polyneuropathy: Secondary | ICD-10-CM | POA: Diagnosis not present

## 2023-07-07 DIAGNOSIS — S72491D Other fracture of lower end of right femur, subsequent encounter for closed fracture with routine healing: Secondary | ICD-10-CM | POA: Diagnosis not present

## 2023-07-07 DIAGNOSIS — M9701XD Periprosthetic fracture around internal prosthetic right hip joint, subsequent encounter: Secondary | ICD-10-CM | POA: Diagnosis not present

## 2023-07-08 ENCOUNTER — Other Ambulatory Visit: Payer: Self-pay

## 2023-07-08 ENCOUNTER — Encounter: Payer: Self-pay | Admitting: Internal Medicine

## 2023-07-08 DIAGNOSIS — F3340 Major depressive disorder, recurrent, in remission, unspecified: Secondary | ICD-10-CM

## 2023-07-08 MED ORDER — BUPROPION HCL ER (XL) 150 MG PO TB24: ORAL_TABLET | ORAL | 0 refills | Status: DC

## 2023-07-12 DIAGNOSIS — E1142 Type 2 diabetes mellitus with diabetic polyneuropathy: Secondary | ICD-10-CM | POA: Diagnosis not present

## 2023-07-12 DIAGNOSIS — E1169 Type 2 diabetes mellitus with other specified complication: Secondary | ICD-10-CM | POA: Diagnosis not present

## 2023-07-12 DIAGNOSIS — S72491D Other fracture of lower end of right femur, subsequent encounter for closed fracture with routine healing: Secondary | ICD-10-CM | POA: Diagnosis not present

## 2023-07-12 DIAGNOSIS — S82831D Other fracture of upper and lower end of right fibula, subsequent encounter for closed fracture with routine healing: Secondary | ICD-10-CM | POA: Diagnosis not present

## 2023-07-12 DIAGNOSIS — M9711XD Periprosthetic fracture around internal prosthetic right knee joint, subsequent encounter: Secondary | ICD-10-CM | POA: Diagnosis not present

## 2023-07-12 DIAGNOSIS — M9701XD Periprosthetic fracture around internal prosthetic right hip joint, subsequent encounter: Secondary | ICD-10-CM | POA: Diagnosis not present

## 2023-07-13 DIAGNOSIS — S72491D Other fracture of lower end of right femur, subsequent encounter for closed fracture with routine healing: Secondary | ICD-10-CM | POA: Diagnosis not present

## 2023-07-13 DIAGNOSIS — M9701XD Periprosthetic fracture around internal prosthetic right hip joint, subsequent encounter: Secondary | ICD-10-CM | POA: Diagnosis not present

## 2023-07-13 DIAGNOSIS — E1142 Type 2 diabetes mellitus with diabetic polyneuropathy: Secondary | ICD-10-CM | POA: Diagnosis not present

## 2023-07-13 DIAGNOSIS — E1169 Type 2 diabetes mellitus with other specified complication: Secondary | ICD-10-CM | POA: Diagnosis not present

## 2023-07-13 DIAGNOSIS — S82831D Other fracture of upper and lower end of right fibula, subsequent encounter for closed fracture with routine healing: Secondary | ICD-10-CM | POA: Diagnosis not present

## 2023-07-13 DIAGNOSIS — M9711XD Periprosthetic fracture around internal prosthetic right knee joint, subsequent encounter: Secondary | ICD-10-CM | POA: Diagnosis not present

## 2023-07-14 DIAGNOSIS — E1169 Type 2 diabetes mellitus with other specified complication: Secondary | ICD-10-CM | POA: Diagnosis not present

## 2023-07-14 DIAGNOSIS — S82831D Other fracture of upper and lower end of right fibula, subsequent encounter for closed fracture with routine healing: Secondary | ICD-10-CM | POA: Diagnosis not present

## 2023-07-14 DIAGNOSIS — M9711XD Periprosthetic fracture around internal prosthetic right knee joint, subsequent encounter: Secondary | ICD-10-CM | POA: Diagnosis not present

## 2023-07-14 DIAGNOSIS — S72491D Other fracture of lower end of right femur, subsequent encounter for closed fracture with routine healing: Secondary | ICD-10-CM | POA: Diagnosis not present

## 2023-07-14 DIAGNOSIS — E1142 Type 2 diabetes mellitus with diabetic polyneuropathy: Secondary | ICD-10-CM | POA: Diagnosis not present

## 2023-07-14 DIAGNOSIS — M9701XD Periprosthetic fracture around internal prosthetic right hip joint, subsequent encounter: Secondary | ICD-10-CM | POA: Diagnosis not present

## 2023-07-15 DIAGNOSIS — M9711XD Periprosthetic fracture around internal prosthetic right knee joint, subsequent encounter: Secondary | ICD-10-CM | POA: Diagnosis not present

## 2023-07-15 DIAGNOSIS — S82831D Other fracture of upper and lower end of right fibula, subsequent encounter for closed fracture with routine healing: Secondary | ICD-10-CM | POA: Diagnosis not present

## 2023-07-15 DIAGNOSIS — S72491D Other fracture of lower end of right femur, subsequent encounter for closed fracture with routine healing: Secondary | ICD-10-CM | POA: Diagnosis not present

## 2023-07-15 DIAGNOSIS — M9701XD Periprosthetic fracture around internal prosthetic right hip joint, subsequent encounter: Secondary | ICD-10-CM | POA: Diagnosis not present

## 2023-07-15 DIAGNOSIS — E1142 Type 2 diabetes mellitus with diabetic polyneuropathy: Secondary | ICD-10-CM | POA: Diagnosis not present

## 2023-07-15 DIAGNOSIS — E1169 Type 2 diabetes mellitus with other specified complication: Secondary | ICD-10-CM | POA: Diagnosis not present

## 2023-07-18 ENCOUNTER — Other Ambulatory Visit (INDEPENDENT_AMBULATORY_CARE_PROVIDER_SITE_OTHER): Payer: Self-pay

## 2023-07-18 ENCOUNTER — Ambulatory Visit (INDEPENDENT_AMBULATORY_CARE_PROVIDER_SITE_OTHER): Admitting: Orthopaedic Surgery

## 2023-07-18 DIAGNOSIS — Z9889 Other specified postprocedural states: Secondary | ICD-10-CM

## 2023-07-18 DIAGNOSIS — Z8781 Personal history of (healed) traumatic fracture: Secondary | ICD-10-CM | POA: Diagnosis not present

## 2023-07-18 NOTE — Progress Notes (Signed)
 The patient is now 11 weeks status post a mechanical fall in which she sustained a left periprosthetic distal femur fracture.  She underwent lateral plating with a para-articular locking plate and screws that was appropriate for trying to salvage a total knee arthroplasty.  She has been just touchdown weightbearing for now.  On exam there is no knee swelling.  The incisions healed nicely and she has good range of motion of her left knee.  There is pain to be expected still.  2 views of the left knee standing today show that the fracture has not healed yet.  There is no evidence of hardware failure but there is concerned about the bone being significantly osteopenic.  There is also concern of this is developing a nonunion.  I do feel like she Loflin end up needing a distal femur replacement.  I will order a CT scan of her left distal femur today and can take a look at those findings and then go from there in terms of what treatment options [place] be needed or recommended.

## 2023-07-19 ENCOUNTER — Encounter: Payer: Self-pay | Admitting: Orthopaedic Surgery

## 2023-07-20 DIAGNOSIS — M9701XD Periprosthetic fracture around internal prosthetic right hip joint, subsequent encounter: Secondary | ICD-10-CM | POA: Diagnosis not present

## 2023-07-20 DIAGNOSIS — S72491D Other fracture of lower end of right femur, subsequent encounter for closed fracture with routine healing: Secondary | ICD-10-CM | POA: Diagnosis not present

## 2023-07-20 DIAGNOSIS — M9711XD Periprosthetic fracture around internal prosthetic right knee joint, subsequent encounter: Secondary | ICD-10-CM | POA: Diagnosis not present

## 2023-07-20 DIAGNOSIS — S82831D Other fracture of upper and lower end of right fibula, subsequent encounter for closed fracture with routine healing: Secondary | ICD-10-CM | POA: Diagnosis not present

## 2023-07-20 DIAGNOSIS — E1169 Type 2 diabetes mellitus with other specified complication: Secondary | ICD-10-CM | POA: Diagnosis not present

## 2023-07-20 DIAGNOSIS — E1142 Type 2 diabetes mellitus with diabetic polyneuropathy: Secondary | ICD-10-CM | POA: Diagnosis not present

## 2023-07-21 DIAGNOSIS — S82831D Other fracture of upper and lower end of right fibula, subsequent encounter for closed fracture with routine healing: Secondary | ICD-10-CM | POA: Diagnosis not present

## 2023-07-21 DIAGNOSIS — S72491D Other fracture of lower end of right femur, subsequent encounter for closed fracture with routine healing: Secondary | ICD-10-CM | POA: Diagnosis not present

## 2023-07-21 DIAGNOSIS — E1169 Type 2 diabetes mellitus with other specified complication: Secondary | ICD-10-CM | POA: Diagnosis not present

## 2023-07-21 DIAGNOSIS — M9701XD Periprosthetic fracture around internal prosthetic right hip joint, subsequent encounter: Secondary | ICD-10-CM | POA: Diagnosis not present

## 2023-07-21 DIAGNOSIS — M9711XD Periprosthetic fracture around internal prosthetic right knee joint, subsequent encounter: Secondary | ICD-10-CM | POA: Diagnosis not present

## 2023-07-21 DIAGNOSIS — E1142 Type 2 diabetes mellitus with diabetic polyneuropathy: Secondary | ICD-10-CM | POA: Diagnosis not present

## 2023-07-22 ENCOUNTER — Ambulatory Visit
Admission: RE | Admit: 2023-07-22 | Discharge: 2023-07-22 | Disposition: A | Discharge status: Home / Self Care | Source: Ambulatory Visit | Attending: Orthopaedic Surgery | Admitting: Orthopaedic Surgery

## 2023-07-22 DIAGNOSIS — S72491D Other fracture of lower end of right femur, subsequent encounter for closed fracture with routine healing: Secondary | ICD-10-CM | POA: Diagnosis not present

## 2023-07-22 DIAGNOSIS — E1142 Type 2 diabetes mellitus with diabetic polyneuropathy: Secondary | ICD-10-CM | POA: Diagnosis not present

## 2023-07-22 DIAGNOSIS — Z4789 Encounter for other orthopedic aftercare: Secondary | ICD-10-CM | POA: Diagnosis not present

## 2023-07-22 DIAGNOSIS — M9711XD Periprosthetic fracture around internal prosthetic right knee joint, subsequent encounter: Secondary | ICD-10-CM | POA: Diagnosis not present

## 2023-07-22 DIAGNOSIS — E1169 Type 2 diabetes mellitus with other specified complication: Secondary | ICD-10-CM | POA: Diagnosis not present

## 2023-07-22 DIAGNOSIS — M9701XD Periprosthetic fracture around internal prosthetic right hip joint, subsequent encounter: Secondary | ICD-10-CM | POA: Diagnosis not present

## 2023-07-22 DIAGNOSIS — Z9889 Other specified postprocedural states: Secondary | ICD-10-CM

## 2023-07-22 DIAGNOSIS — S82831D Other fracture of upper and lower end of right fibula, subsequent encounter for closed fracture with routine healing: Secondary | ICD-10-CM | POA: Diagnosis not present

## 2023-07-25 ENCOUNTER — Other Ambulatory Visit: Payer: Self-pay

## 2023-07-26 ENCOUNTER — Telehealth: Payer: Self-pay | Admitting: Orthopaedic Surgery

## 2023-07-26 NOTE — Telephone Encounter (Signed)
 [name] [name] from [employer] [facility] called and wants to you fax over recent notes and CT scan that was done for PT. CB#[phone]  Fax#[phone] or [phone]

## 2023-07-27 ENCOUNTER — Other Ambulatory Visit: Payer: Self-pay

## 2023-07-27 ENCOUNTER — Telehealth: Payer: Self-pay | Admitting: *Deleted

## 2023-07-27 MED ORDER — EXEMESTANE 25 MG PO TABS: 25.0000 mg | ORAL_TABLET | Freq: Every day | ORAL | 1 refills | Status: DC

## 2023-07-27 NOTE — Telephone Encounter (Signed)
 No entry

## 2023-07-27 NOTE — Telephone Encounter (Signed)
 Faxed to both

## 2023-07-28 ENCOUNTER — Ambulatory Visit (INDEPENDENT_AMBULATORY_CARE_PROVIDER_SITE_OTHER): Payer: Medicare Other | Admitting: Internal Medicine

## 2023-07-28 ENCOUNTER — Encounter: Payer: Self-pay | Admitting: Internal Medicine

## 2023-07-28 VITALS — BP 123/72 | HR 74 | Ht 60.0 in | Wt 235.0 lb

## 2023-07-28 DIAGNOSIS — D696 Thrombocytopenia, unspecified: Secondary | ICD-10-CM

## 2023-07-28 DIAGNOSIS — F5101 Primary insomnia: Secondary | ICD-10-CM | POA: Diagnosis not present

## 2023-07-28 DIAGNOSIS — F419 Anxiety disorder, unspecified: Secondary | ICD-10-CM | POA: Diagnosis not present

## 2023-07-28 DIAGNOSIS — E1142 Type 2 diabetes mellitus with diabetic polyneuropathy: Secondary | ICD-10-CM

## 2023-07-28 DIAGNOSIS — Z794 Long term (current) use of insulin: Secondary | ICD-10-CM | POA: Diagnosis not present

## 2023-07-28 DIAGNOSIS — I10 Essential (primary) hypertension: Secondary | ICD-10-CM

## 2023-07-28 DIAGNOSIS — M109 Gout, unspecified: Secondary | ICD-10-CM | POA: Diagnosis not present

## 2023-07-28 DIAGNOSIS — I8510 Secondary esophageal varices without bleeding: Secondary | ICD-10-CM

## 2023-07-28 DIAGNOSIS — R296 Repeated falls: Secondary | ICD-10-CM | POA: Diagnosis not present

## 2023-07-28 DIAGNOSIS — S72451D Displaced supracondylar fracture without intracondylar extension of lower end of right femur, subsequent encounter for closed fracture with routine healing: Secondary | ICD-10-CM | POA: Diagnosis not present

## 2023-07-28 DIAGNOSIS — E785 Hyperlipidemia, unspecified: Secondary | ICD-10-CM

## 2023-07-28 DIAGNOSIS — Z556 Problems related to health literacy: Secondary | ICD-10-CM | POA: Diagnosis not present

## 2023-07-28 DIAGNOSIS — Z6841 Body Mass Index (BMI) 40.0 and over, adult: Secondary | ICD-10-CM | POA: Diagnosis not present

## 2023-07-28 DIAGNOSIS — F3340 Major depressive disorder, recurrent, in remission, unspecified: Secondary | ICD-10-CM | POA: Diagnosis not present

## 2023-07-28 DIAGNOSIS — D509 Iron deficiency anemia, unspecified: Secondary | ICD-10-CM

## 2023-07-28 DIAGNOSIS — E1169 Type 2 diabetes mellitus with other specified complication: Secondary | ICD-10-CM

## 2023-07-28 DIAGNOSIS — D630 Anemia in neoplastic disease: Secondary | ICD-10-CM | POA: Diagnosis not present

## 2023-07-28 DIAGNOSIS — E66813 Obesity, class 3: Secondary | ICD-10-CM | POA: Diagnosis not present

## 2023-07-28 DIAGNOSIS — S82831D Other fracture of upper and lower end of right fibula, subsequent encounter for closed fracture with routine healing: Secondary | ICD-10-CM | POA: Diagnosis not present

## 2023-07-28 DIAGNOSIS — E039 Hypothyroidism, unspecified: Secondary | ICD-10-CM

## 2023-07-28 DIAGNOSIS — S72491D Other fracture of lower end of right femur, subsequent encounter for closed fracture with routine healing: Secondary | ICD-10-CM | POA: Diagnosis not present

## 2023-07-28 DIAGNOSIS — L121 Cicatricial pemphigoid: Secondary | ICD-10-CM | POA: Diagnosis not present

## 2023-07-28 DIAGNOSIS — M150 Primary generalized (osteo)arthritis: Secondary | ICD-10-CM | POA: Diagnosis not present

## 2023-07-28 DIAGNOSIS — C50412 Malignant neoplasm of upper-outer quadrant of left female breast: Secondary | ICD-10-CM | POA: Diagnosis not present

## 2023-07-28 DIAGNOSIS — Z993 Dependence on wheelchair: Secondary | ICD-10-CM | POA: Diagnosis not present

## 2023-07-28 DIAGNOSIS — G4733 Obstructive sleep apnea (adult) (pediatric): Secondary | ICD-10-CM | POA: Diagnosis not present

## 2023-07-28 DIAGNOSIS — K7460 Unspecified cirrhosis of liver: Secondary | ICD-10-CM | POA: Diagnosis not present

## 2023-07-28 DIAGNOSIS — M9701XD Periprosthetic fracture around internal prosthetic right hip joint, subsequent encounter: Secondary | ICD-10-CM | POA: Diagnosis not present

## 2023-07-28 DIAGNOSIS — M9711XD Periprosthetic fracture around internal prosthetic right knee joint, subsequent encounter: Secondary | ICD-10-CM | POA: Diagnosis not present

## 2023-07-28 MED ORDER — MISC. DEVICES MISC: 0 refills | Status: AC

## 2023-07-28 MED ORDER — TRAZODONE HCL 50 MG PO TABS: 25.0000 mg | ORAL_TABLET | Freq: Every evening | ORAL | 3 refills | Status: DC | PRN

## 2023-07-28 MED ORDER — LOSARTAN POTASSIUM 50 MG PO TABS
50.0000 mg | ORAL_TABLET | Freq: Every day | ORAL | 1 refills | Status: DC
Start: 2023-07-28 — End: 2023-11-24

## 2023-07-28 NOTE — Progress Notes (Signed)
 Established Patient Office Visit  Subjective:  Patient ID: [name] [name], female    DOB: [birth year]  Age: 73 y.o. MRN: [id number]  CC:  Chief Complaint  Patient presents with   Medical Management of Chronic Issues    4 month f/u     HPI [name] [name] is a 73 y.o. female with past medical history of HTN, type II DM with neuropathy, OSA, hypothyroidism, OA of multiple joints, gout, MDD, breast cancer s/p lumpectomy and morbid obesity who presents for f/u of her chronic medical conditions.   HTN: Her BP is WNL in the office today.  She takes Coreg  3.125 mg BID and Losartan  50 mg QD.  She denies any headache, dizziness, chest pain, or palpitations currently.  Type II DM with HLD: Followed by endocrinology.  She is on Ozempic , Lantus  62 U QD and ISS.  She also takes Farxiga .  Her last HbA1C was 6.5 in 03/25. She denies any polyuria or polydipsia currently.  She takes gabapentin  for neuropathy, but still has severe burning pain of bilateral feet.  She also has chronic numbness of bilateral feet.  She used to follow-up with neurology, but has not visited them recently.  Diabetic neuropathy and recurrent falls: She has had recurrent falls due to neuropathy, OA of knee and visual disturbance.  She currently takes gabapentin  for neuropathy.  She is wheelchair bound currently due to last fall, leading to fracture of right femur, which has delayed healing, followed by Orthopedic surgery. She is unable to use manual wheelchair by herself due to OA of hands as well.  She would benefit from electric scooter, which will improve her mobility and safety, but was only able to get electric wheelchair and she does not feel safe using it.  She has history of left breast cancer, s/p lumpectomy.  She is on Aromasin  for ER/PR positive breast cancer and follows up with Oncology. She has completed radiation for recurrent breast cancer.  Anemia: Her Hb was 8.2 in 04/25 after ORIF for right femur fracture. Her baseline Hb  is around 11. She denies any signs of active bleeding currently. She still feels fatigue and dyspnea with minimal exertion.  Insomnia: She reports difficulty initiating and maintaining sleep.  She has felt worse since her last fall.  She admits that pain also interferes with her sleep.  Past Medical History:  Diagnosis Date   Abdominal wall contusion 12/09/2011   Achilles tendon contracture, bilateral 06/03/2016   Anxiety    Arthritis    Blood dyscrasia    low platelet count- sees [name] , Dr. Eleanor Lily ( Note in EPIC from 07/2014) at [facility]   Breast cancer [facility]) 2017   Left Breast   Breast cancer (HCC) 07/2021   right breast IDC   Cancer (HCC)    left breast   Chest wall contusion 12/09/2011   Cirrhosis of liver (HCC)    Cough    Diabetes mellitus without complication (HCC)    Diabetic neuropathy (HCC)    Diarrhea due to drug 01/29/2021   Dyspnea    Gout    Gout    History of radiation therapy 10/22/15 - 12/08/15   left breast 50.4 Gy, boost to 10 Gy   History of radiation therapy    Right breast- 09/22/21-10/30/21- Dr. Lynwood Nasuti   Hypertension    Hypothyroidism    Migraine    Multifactorial gait disorder 01/12/2013   Neuromuscular disorder (HCC)    diabetic neuropathy   Neuropathy  Personal history of radiation therapy 2017   Left Breast Cancer   Posterior tibial tendon dysfunction (PTTD) of right lower extremity 10/09/2019   Sleep apnea    CPAP nightly   Spleen enlarged    Thyroid  disease     Past Surgical History:  Procedure Laterality Date   ANKLE FUSION Right 2022   BIOPSY  10/19/2022   Procedure: BIOPSY;  Surgeon: Eartha Angelia Sieving, MD;  Location: [facility];  Service: Gastroenterology;;   BREAST BIOPSY Left 08/04/2015   Procedure: LEFT BREAST BIOPSY WITH NEEDLE LOCALIZATION;  Surgeon: Krystal Spinner, MD;  Location: [facility];  Service: General;  Laterality: Left;   BREAST BIOPSY Right 06/23/2021   BREAST  DUCTAL SYSTEM EXCISION Left 08/04/2015   Procedure: LEFT EXCISION DUCTAL SYSTEM BREAST;  Surgeon: Krystal Spinner, MD;  Location: [facility];  Service: General;  Laterality: Left;   BREAST LUMPECTOMY Left 08/2015   BREAST LUMPECTOMY Right 08/2021   BREAST LUMPECTOMY WITH AXILLARY LYMPH NODE BIOPSY Left 09/12/2015   Procedure: RE-EXCISION OF LEFT BREAST LUMPECTOMY WITH LEFT AXILLARY LYMPH NODE BIOPSY;  Surgeon: Krystal Spinner, MD;  Location: [facility];  Service: General;  Laterality: Left;   BREAST LUMPECTOMY WITH RADIOACTIVE SEED LOCALIZATION Right 08/12/2021   Procedure: RIGHT BREAST LUMPECTOMY WITH RADIOACTIVE SEED LOCALIZATION;  Surgeon: Vernetta Berg, MD;  Location:  SURGERY CENTER;  Service: General;  Laterality: Right;  LMA   CHOLECYSTECTOMY     ESOPHAGOGASTRODUODENOSCOPY (EGD) WITH PROPOFOL  N/A 10/19/2022   Procedure: ESOPHAGOGASTRODUODENOSCOPY (EGD) WITH PROPOFOL ;  Surgeon: Eartha Angelia Sieving, MD;  Location: [facility];  Service: Gastroenterology;  Laterality: N/A;  10:30am asa 3   EYE SURGERY Bilateral 2022   Lashes growing backwards into the eye   JOINT REPLACEMENT     left knee   JOINT REPLACEMENT  2017   right knee   ORIF FEMUR FRACTURE Right 05/02/2023   Procedure: OPEN REDUCTION INTERNAL FIXATION (ORIF) DISTAL FEMUR FRACTURE;  Surgeon: Genelle Standing, MD;  Location: [facility];  Service: Orthopedics;  Laterality: Right;   REVERSE SHOULDER ARTHROPLASTY Right 09/07/2018   Procedure: RIGHT REVERSE SHOULDER ARTHROPLASTY;  Surgeon: Addie Cordella Hamilton, MD;  Location[facility];  Service: Orthopedics;  Laterality: Right;   TOTAL KNEE ARTHROPLASTY Right 02/28/2015   Procedure: RIGHT TOTAL KNEE ARTHROPLASTY;  Surgeon: Lonni CINDERELLA Vernetta, MD;  Location: [facility];  Service: Orthopedics;  Laterality: Right;   TUBAL LIGATION      Family History  Problem Relation Age of Onset   Hypothyroidism Mother    Alzheimer's disease Mother    Diabetes  Father    Cancer Sister        GYN cancer   Hypothyroidism Sister    Heart disease Brother    Arthritis Son    Diabetes Son    Arthritis Son    Cancer Maternal Grandmother    Heart failure Maternal Grandfather    Bone cancer Paternal Grandmother    Heart failure Paternal Grandmother     Social History   Socioeconomic History   Marital status: Married    Spouse name: Dallas Rusk   Number of children: 3   Years of education: 12th   Highest education level: 12th grade  Occupational History   Occupation: Retired    Comment: Dance movement psychotherapist  Tobacco Use   Smoking status: Never    Passive exposure: Never   Smokeless tobacco: Never  Vaping Use   Vaping status: Never Used  Substance and Sexual  Activity   Alcohol use: No   Drug use: No   Sexual activity: Yes    Partners: Male    Birth control/protection: Surgical    Comment: BTL  Other Topics Concern   Not on file  Social History Narrative   Not on file   Social Drivers of Health   Financial Resource Strain: Low Risk  (03/16/2023)   Overall Financial Resource Strain (CARDIA)    Difficulty of Paying Living Expenses: Not hard at all  Food Insecurity: No Food Insecurity (05/01/2023)   Hunger Vital Sign    Worried About Running Out of Food in the Last Year: Never true    Ran Out of Food in the Last Year: Never true  Transportation Needs: No Transportation Needs (05/01/2023)   PRAPARE - Administrator, Civil Service (Medical): No    Lack of Transportation (Non-Medical): No  Physical Activity: Sufficiently Active (03/16/2023)   Exercise Vital Sign    Days of Exercise per Week: 5 days    Minutes of Exercise per Session: 50 min  Stress: No Stress Concern Present (03/16/2023)   [employer] of Occupational Health - Occupational Stress Questionnaire    Feeling of Stress : Only a little  Social Connections: Moderately Integrated (05/01/2023)   Social Connection and Isolation Panel    Frequency of  Communication with Friends and Family: More than three times a week    Frequency of Social Gatherings with Friends and Family: More than three times a week    Attends Religious Services: Never    Database administrator or Organizations: Yes    Attends Engineer, structural: More than 4 times per year    Marital Status: Married  Catering manager Violence: Not At Risk (05/01/2023)   Humiliation, Afraid, Rape, and Kick questionnaire    Fear of Current or Ex-Partner: No    Emotionally Abused: No    Physically Abused: No    Sexually Abused: No    Outpatient Medications Prior to Visit  Medication Sig Dispense Refill   ACCU-CHEK AVIVA PLUS test strip USE AS INSTRUCTED TO CHECK BLOOD SUGAR 2 TIMES DAILY 100 strip 5   acetaminophen  (TYLENOL ) 325 MG tablet Take 2 tablets (650 mg total) by mouth every 6 (six) hours as needed for mild pain (pain score 1-3) or moderate pain (pain score 4-6) (or Fever >/= 101). 30 tablet 0   acetaminophen  (TYLENOL ) 500 MG tablet Take 500 mg by mouth every 8 (eight) hours as needed for moderate pain (pain score 4-6).     albuterol  (VENTOLIN  HFA) 108 (90 Base) MCG/ACT inhaler Inhale 1-2 puffs into the lungs every 4 (four) hours as needed for wheezing or shortness of breath. 18 g 2   Ascorbic Acid (VITAMIN C PO) Take 1 tablet by mouth daily.     buPROPion  (WELLBUTRIN  XL) 150 MG 24 hr tablet TAKE 1 TABLET BY MOUTH EVERY DAY IN THE MORNING 90 tablet 0   carvedilol  (COREG ) 3.125 MG tablet Take 1 tablet (3.125 mg total) by mouth 2 (two) times daily with a meal. 180 tablet 0   Cyanocobalamin  (VITAMIN B-12 PO) Take 1 tablet by mouth daily.     exemestane  (AROMASIN ) 25 MG tablet Take 1 tablet (25 mg total) by mouth daily after breakfast. 90 tablet 1   FARXIGA  10 MG TABS tablet TAKE 1 TABLET BY MOUTH DAILY BEFORE BREAKFAST. 90 tablet 3   fluticasone (FLONASE) 50 MCG/ACT nasal spray Place 1 spray into both nostrils daily as  needed for allergies.     gabapentin  (NEURONTIN )  600 MG tablet TAKE 1 TABLET IN THE MORNING AND 1 TABLET IN THE AFTERNOON AND 2TABS AT BEDTIME     insulin  glargine (LANTUS  SOLOSTAR) 100 UNIT/ML Solostar Pen Inject 62 Units into the skin daily. 30 mL 0   Insulin  Pen Needle (B-D UF III MINI PEN NEEDLES) 31G X 5 MM MISC USE AS DIRECTED WITH INSULIN  EVERY MORNING, NOON, EVENING AND AT BEDTIME 400 each 3   Insulin  Pen Needle 30G X 5 MM MISC 1 Device by Does not apply route in the morning, at noon, in the evening, and at bedtime. 400 each 3   ketoconazole  (NIZORAL ) 2 % cream Apply 1 Application topically daily. 60 g 0   levothyroxine  (SYNTHROID ) 150 MCG tablet Take 1 tablet (150 mcg total) by mouth daily. 90 tablet 3   meclizine  (ANTIVERT ) 25 MG tablet Take 1 tablet (25 mg total) by mouth 3 (three) times daily as needed for dizziness. 30 tablet 0   methocarbamol  (ROBAXIN ) 500 MG tablet Take 1 tablet (500 mg total) by mouth every 8 (eight) hours as needed for muscle spasms. 30 tablet 0   mirabegron  ER (MYRBETRIQ ) 25 MG TB24 tablet Take 1 tablet (25 mg total) by mouth daily. 30 tablet 11   Multiple Vitamins-Minerals (MULTIVITAMIN WITH MINERALS) tablet Take 1 tablet by mouth daily.     mycophenolate  (CELLCEPT ) 500 MG tablet Take 1,500 mg by mouth 2 (two) times daily.     omeprazole  (PRILOSEC) 20 MG capsule Take 1 capsule (20 mg total) by mouth daily. 90 capsule 3   promethazine -dextromethorphan (PROMETHAZINE -DM) 6.25-15 MG/5ML syrup Take 5 mLs by mouth 4 (four) times daily as needed for cough. 180 mL 0   Pyridoxine HCl (VITAMIN B-6 PO) Take by mouth daily.     Semaglutide , 1 MG/DOSE, (OZEMPIC , 1 MG/DOSE,) 4 MG/3ML SOPN Inject 1 mg into the skin once a week. 3 mL 5   UNABLE TO FIND 1 each by Does not apply route daily. Med Name: POWER WHEELCHAIR  DX CODE-R26.89 1 each 0   VITAMIN D PO Take 1 tablet by mouth daily.     losartan  (COZAAR ) 25 MG tablet Take 2 tablets (50 mg total) by mouth daily. 30 tablet 0   sulfamethoxazole -trimethoprim  (BACTRIM  DS)  800-160 MG tablet Take 1 tablet by mouth 2 (two) times daily. 10 tablet 0   No facility-administered medications prior to visit.    Allergies  Allergen Reactions   Codeine  Swelling   Influenza A (H1n1) Monoval Pf Anaphylaxis   Hydrocodone -Acetaminophen  Other (See Comments)    unknown   Lisinopril  Other (See Comments)    unknown   Metformin Other (See Comments)    unknown   Pregabalin Other (See Comments)    unknown   Jardiance [Empagliflozin] Other (See Comments)    dehydration    ROS Review of Systems  Constitutional:  Positive for fatigue. Negative for chills and fever.  HENT:  Negative for congestion, sinus pressure and sinus pain.   Respiratory:  Negative for cough and wheezing.   Cardiovascular:  Negative for chest pain and palpitations.  Gastrointestinal:  Negative for diarrhea, nausea and vomiting.  Genitourinary:  Negative for dysuria and hematuria.  Musculoskeletal:  Positive for arthralgias, back pain, gait problem and neck pain.  Skin:  Negative for rash.  Neurological:  Positive for weakness and numbness.  Psychiatric/Behavioral:  Positive for sleep disturbance. Negative for agitation and behavioral problems.       Objective:  Physical Exam Vitals reviewed.  Constitutional:      General: She is not in acute distress.    Appearance: She is obese. She is not diaphoretic.     Comments: In wheelchair  HENT:     Head: Normocephalic and atraumatic.     Nose: No congestion.     Mouth/Throat:     Mouth: Mucous membranes are moist.   Eyes:     General: No scleral icterus.    Extraocular Movements: Extraocular movements intact.    Cardiovascular:     Rate and Rhythm: Normal rate and regular rhythm.     Heart sounds: Normal heart sounds. No murmur heard. Pulmonary:     Breath sounds: Normal breath sounds. No wheezing or rales.   Musculoskeletal:     Left shoulder: Tenderness present. Decreased range of motion. Decreased strength.     Cervical back:  Neck supple. No tenderness.     Right lower leg: No edema.     Left lower leg: No edema.   Skin:    General: Skin is warm.     Findings: No rash.   Neurological:     General: No focal deficit present.     Mental Status: She is alert and oriented to person, place, and time.     Sensory: Sensory deficit present.     Motor: Weakness (3/5 in b/l LE, 4/5 in b/l UE) present.     Gait: Gait abnormal.   Psychiatric:        Mood and Affect: Mood normal.        Behavior: Behavior normal.     BP 123/72   Pulse 74   Ht 5' (1.524 m)   Wt 235 lb (106.6 kg)   SpO2 97%   BMI 45.90 kg/m  Wt Readings from Last 3 Encounters:  07/28/23 235 lb (106.6 kg)  05/02/23 248 lb (112.5 kg)  04/12/23 252 lb (114.3 kg)    Lab Results  Component Value Date   TSH 4.28 12/14/2021   Lab Results  Component Value Date   WBC 2.0 (L) 05/05/2023   HGB 8.2 (L) 05/05/2023   HCT 25.7 (L) 05/05/2023   MCV 89.2 05/05/2023   PLT 44 (L) 05/05/2023   Lab Results  Component Value Date   NA 132 (L) 05/04/2023   K 4.4 05/04/2023   CHLORIDE 104 10/06/2016   CO2 20 (L) 05/04/2023   GLUCOSE 171 (H) 05/04/2023   BUN 26 (H) 05/04/2023   CREATININE 1.05 (H) 05/04/2023   BILITOT 0.8 04/12/2023   ALKPHOS 139 (H) 04/12/2023   AST 24 04/12/2023   ALT 10 04/12/2023   PROT 7.0 04/12/2023   ALBUMIN  3.4 (L) 04/12/2023   CALCIUM  8.6 (L) 05/04/2023   ANIONGAP 5 05/04/2023   EGFR 50 (L) 10/06/2016   GFR 50.27 (L) 08/24/2019   Lab Results  Component Value Date   CHOL 171 06/09/2021   Lab Results  Component Value Date   HDL 63.30 06/09/2021   Lab Results  Component Value Date   LDLCALC 80 06/09/2021   Lab Results  Component Value Date   TRIG 137.0 06/09/2021   Lab Results  Component Value Date   CHOLHDL 3 06/09/2021   Lab Results  Component Value Date   HGBA1C 6.5 (H) 05/01/2023      Assessment & Plan:   Problem List Items Addressed This Visit       Cardiovascular and Mediastinum    Essential hypertension - Primary   BP Readings  from Last 1 Encounters:  07/28/23 123/72   Well-controlled with Coreg  3.125 mg BID and Losartan  50 mg QD Counseled for compliance with the medications Advised DASH diet and moderate exercise/walking as tolerated      Relevant Medications   losartan  (COZAAR ) 50 MG tablet   Misc. Devices MISC   Esophageal varices in cirrhosis (HCC)   Noted on CT abdomen Possibly needs EGD later On Coreg       Relevant Medications   losartan  (COZAAR ) 50 MG tablet     Digestive   Cirrhosis of liver without ascites (HCC)   Noted on CT abdomen Has portal hypertension and esophageal varices as well Needs GI evaluation      Relevant Orders   CMP14+EGFR     Endocrine   Diabetic polyneuropathy associated with type 2 diabetes mellitus (HCC)   On Gabapentin  600 mg in morning and afternoon, and 1200 mg at bedtime Used to follow Neurology      Relevant Medications   losartan  (COZAAR ) 50 MG tablet   traZODone (DESYREL) 50 MG tablet   Type 2 diabetes mellitus with hyperlipidemia (HCC)   Lab Results  Component Value Date   HGBA1C 6.5 (H) 05/01/2023   Well-controlled On Lantus  and ISS - followed by Endocrinology On Ozempic  and Farxiga  Advised to follow diabetic diet On ARB Diabetic eye exam: Advised to follow up with Ophthalmology for diabetic eye exam      Relevant Medications   losartan  (COZAAR ) 50 MG tablet   Other Relevant Orders   Microalbumin / creatinine urine ratio   Acquired hypothyroidism   On Levothyroxine  150 mcg QD Followed by Endocrinology        Musculoskeletal and Integument   Right femoral fracture (HCC)   Currently nonweightbearing, wheelchair-bound Followed by orthopedic surgery        Hematopoietic and Hemostatic   Thrombocytopenia (HCC)   Plt: 44 in 04/25 in the post op setting Recheck CBC        Other   Recurrent falls   Likely due to diabetic neuropathy, OA of knee and ankle, DDD of lumbar spine and  muscular atrophy  Has manual wheelchair, but unable to operate manually due to OA of hands, does not have 24-hour caregiver to propel the manual wheelchair  Dependent for ADLs  She can safely use electric scooter, but was able to get only electric wheelchair, she does not feel safe to use it. She is willing and motivated to use the electric scooter, which will improve her independence for mobility and improve her safety.      Primary insomnia   Started trazodone 25 mg at bedtime, advised to take 50 mg if she has persistent insomnia Sleep hygiene discussed      Relevant Medications   traZODone (DESYREL) 50 MG tablet   Microcytic anemia   Likely had acute blood loss anemia in the postop setting Hb: 8.2 in 04/25 Recheck CBC Advised to contact if she has any signs of bleeding      Relevant Orders   CBC with Differential/Platelet      Meds ordered this encounter  Medications   losartan  (COZAAR ) 50 MG tablet    Sig: Take 1 tablet (50 mg total) by mouth daily.    Dispense:  90 tablet    Refill:  1   Misc. Devices MISC    Sig: Blood pressure cuff/device - 1. ICD10: I10    Dispense:  1 each    Refill:  0   traZODone (DESYREL) 50  MG tablet    Sig: Take 0.5-1 tablets (25-50 mg total) by mouth at bedtime as needed for sleep.    Dispense:  30 tablet    Refill:  3    Follow-up: Return in about 6 months (around 01/27/2024) for HTN and DM.    Suzzane MARLA Blanch, MD

## 2023-07-28 NOTE — Assessment & Plan Note (Signed)
Noted on CT abdomen Has portal hypertension and esophageal varices as well Needs GI evaluation

## 2023-07-28 NOTE — Assessment & Plan Note (Signed)
 Plt: 44 in 04/25 in the post op setting Recheck CBC

## 2023-07-28 NOTE — Assessment & Plan Note (Signed)
 Likely due to diabetic neuropathy, OA of knee and ankle, DDD of lumbar spine and muscular atrophy  Has manual wheelchair, but unable to operate manually due to OA of hands, does not have 24-hour caregiver to propel the manual wheelchair  Dependent for ADLs  She can safely use electric scooter, but was able to get only electric wheelchair, she does not feel safe to use it. She is willing and motivated to use the electric scooter, which will improve her independence for mobility and improve her safety.

## 2023-07-28 NOTE — Assessment & Plan Note (Signed)
 Lab Results  Component Value Date   HGBA1C 6.5 (H) 05/01/2023   Well-controlled On Lantus  and ISS - followed by Endocrinology On Ozempic  and Farxiga  Advised to follow diabetic diet On ARB Diabetic eye exam: Advised to follow up with Ophthalmology for diabetic eye exam

## 2023-07-28 NOTE — Patient Instructions (Addendum)
 Please start taking Trazodone as prescribed for insomnia.  Please continue to take other medications as prescribed.  Please continue to follow low carb diet.

## 2023-07-28 NOTE — Assessment & Plan Note (Signed)
 Likely had acute blood loss anemia in the postop setting Hb: 8.2 in 04/25 Recheck CBC Advised to contact if she has any signs of bleeding

## 2023-07-28 NOTE — Assessment & Plan Note (Signed)
 BP Readings from Last 1 Encounters:  07/28/23 123/72   Well-controlled with Coreg  3.125 mg BID and Losartan  50 mg QD Counseled for compliance with the medications Advised DASH diet and moderate exercise/walking as tolerated

## 2023-07-28 NOTE — Assessment & Plan Note (Signed)
 Started trazodone 25 mg at bedtime, advised to take 50 mg if she has persistent insomnia Sleep hygiene discussed

## 2023-07-28 NOTE — Assessment & Plan Note (Addendum)
 On Gabapentin  600 mg in morning and afternoon, and 1200 mg at bedtime Used to follow Neurology

## 2023-07-28 NOTE — Assessment & Plan Note (Signed)
 On Levothyroxine 150 mcg QD Followed by Endocrinology

## 2023-07-28 NOTE — Assessment & Plan Note (Signed)
 Noted on CT abdomen Possibly needs EGD later On Coreg 

## 2023-07-28 NOTE — Assessment & Plan Note (Signed)
 Currently nonweightbearing, wheelchair-bound Followed by orthopedic surgery

## 2023-07-29 ENCOUNTER — Telehealth: Payer: Self-pay | Admitting: Internal Medicine

## 2023-07-29 ENCOUNTER — Ambulatory Visit: Payer: Self-pay | Admitting: Internal Medicine

## 2023-07-29 DIAGNOSIS — Z556 Problems related to health literacy: Secondary | ICD-10-CM | POA: Diagnosis not present

## 2023-07-29 DIAGNOSIS — E039 Hypothyroidism, unspecified: Secondary | ICD-10-CM | POA: Diagnosis not present

## 2023-07-29 DIAGNOSIS — M109 Gout, unspecified: Secondary | ICD-10-CM | POA: Diagnosis not present

## 2023-07-29 DIAGNOSIS — Z6841 Body Mass Index (BMI) 40.0 and over, adult: Secondary | ICD-10-CM | POA: Diagnosis not present

## 2023-07-29 DIAGNOSIS — D630 Anemia in neoplastic disease: Secondary | ICD-10-CM | POA: Diagnosis not present

## 2023-07-29 DIAGNOSIS — C50412 Malignant neoplasm of upper-outer quadrant of left female breast: Secondary | ICD-10-CM | POA: Diagnosis not present

## 2023-07-29 DIAGNOSIS — E785 Hyperlipidemia, unspecified: Secondary | ICD-10-CM | POA: Diagnosis not present

## 2023-07-29 DIAGNOSIS — S82831D Other fracture of upper and lower end of right fibula, subsequent encounter for closed fracture with routine healing: Secondary | ICD-10-CM | POA: Diagnosis not present

## 2023-07-29 DIAGNOSIS — E66813 Obesity, class 3: Secondary | ICD-10-CM | POA: Diagnosis not present

## 2023-07-29 DIAGNOSIS — R296 Repeated falls: Secondary | ICD-10-CM | POA: Diagnosis not present

## 2023-07-29 DIAGNOSIS — M9701XD Periprosthetic fracture around internal prosthetic right hip joint, subsequent encounter: Secondary | ICD-10-CM | POA: Diagnosis not present

## 2023-07-29 DIAGNOSIS — G4733 Obstructive sleep apnea (adult) (pediatric): Secondary | ICD-10-CM | POA: Diagnosis not present

## 2023-07-29 DIAGNOSIS — S72491D Other fracture of lower end of right femur, subsequent encounter for closed fracture with routine healing: Secondary | ICD-10-CM | POA: Diagnosis not present

## 2023-07-29 DIAGNOSIS — D696 Thrombocytopenia, unspecified: Secondary | ICD-10-CM | POA: Diagnosis not present

## 2023-07-29 DIAGNOSIS — F3340 Major depressive disorder, recurrent, in remission, unspecified: Secondary | ICD-10-CM | POA: Diagnosis not present

## 2023-07-29 DIAGNOSIS — M150 Primary generalized (osteo)arthritis: Secondary | ICD-10-CM | POA: Diagnosis not present

## 2023-07-29 DIAGNOSIS — E1142 Type 2 diabetes mellitus with diabetic polyneuropathy: Secondary | ICD-10-CM | POA: Diagnosis not present

## 2023-07-29 DIAGNOSIS — K7460 Unspecified cirrhosis of liver: Secondary | ICD-10-CM | POA: Diagnosis not present

## 2023-07-29 DIAGNOSIS — M9711XD Periprosthetic fracture around internal prosthetic right knee joint, subsequent encounter: Secondary | ICD-10-CM | POA: Diagnosis not present

## 2023-07-29 DIAGNOSIS — E1169 Type 2 diabetes mellitus with other specified complication: Secondary | ICD-10-CM | POA: Diagnosis not present

## 2023-07-29 DIAGNOSIS — I10 Essential (primary) hypertension: Secondary | ICD-10-CM | POA: Diagnosis not present

## 2023-07-29 DIAGNOSIS — L121 Cicatricial pemphigoid: Secondary | ICD-10-CM | POA: Diagnosis not present

## 2023-07-29 DIAGNOSIS — Z794 Long term (current) use of insulin: Secondary | ICD-10-CM | POA: Diagnosis not present

## 2023-07-29 DIAGNOSIS — F419 Anxiety disorder, unspecified: Secondary | ICD-10-CM | POA: Diagnosis not present

## 2023-07-29 DIAGNOSIS — Z993 Dependence on wheelchair: Secondary | ICD-10-CM | POA: Diagnosis not present

## 2023-07-29 LAB — CBC WITH DIFFERENTIAL/PLATELET
Basophils Absolute: 0 10*3/uL (ref 0.0–0.2)
Basos: 1 %
EOS (ABSOLUTE): 0.1 10*3/uL (ref 0.0–0.4)
Eos: 4 %
Hematocrit: 40.2 % (ref 34.0–46.6)
Hemoglobin: 12.7 g/dL (ref 11.1–15.9)
Immature Grans (Abs): 0 10*3/uL (ref 0.0–0.1)
Immature Granulocytes: 0 %
Lymphocytes Absolute: 0.7 10*3/uL (ref 0.7–3.1)
Lymphs: 22 %
MCH: 29.3 pg (ref 26.6–33.0)
MCHC: 31.6 g/dL (ref 31.5–35.7)
MCV: 93 fL (ref 79–97)
Monocytes Absolute: 0.2 10*3/uL (ref 0.1–0.9)
Monocytes: 6 %
Neutrophils Absolute: 2.2 10*3/uL (ref 1.4–7.0)
Neutrophils: 67 %
Platelets: 87 10*3/uL — CL (ref 150–450)
RBC: 4.33 x10E6/uL (ref 3.77–5.28)
RDW: 14.8 % (ref 11.7–15.4)
WBC: 3.3 10*3/uL — ABNORMAL LOW (ref 3.4–10.8)

## 2023-07-29 LAB — CMP14+EGFR
ALT: 12 IU/L (ref 0–32)
AST: 26 IU/L (ref 0–40)
Albumin: 3.8 g/dL (ref 3.8–4.8)
Alkaline Phosphatase: 139 IU/L — ABNORMAL HIGH (ref 44–121)
BUN/Creatinine Ratio: 18 (ref 12–28)
BUN: 17 mg/dL (ref 8–27)
Bilirubin Total: 0.7 mg/dL (ref 0.0–1.2)
CO2: 20 mmol/L (ref 20–29)
Calcium: 10.3 mg/dL (ref 8.7–10.3)
Chloride: 101 mmol/L (ref 96–106)
Creatinine, Ser: 0.93 mg/dL (ref 0.57–1.00)
Globulin, Total: 3.2 g/dL (ref 1.5–4.5)
Glucose: 142 mg/dL — ABNORMAL HIGH (ref 70–99)
Potassium: 4 mmol/L (ref 3.5–5.2)
Sodium: 137 mmol/L (ref 134–144)
Total Protein: 7 g/dL (ref 6.0–8.5)
eGFR: 65 mL/min/{1.73_m2} (ref >59)

## 2023-07-29 NOTE — Telephone Encounter (Signed)
 Spoke to carrie to approve orders.

## 2023-07-29 NOTE — Telephone Encounter (Signed)
 Copied from CRM [id number]. Topic: Clinical - Home Health Verbal Orders >> Jul 28, 2023  4:45 PM Winona SAUNDERS wrote: Caller/Agency: carrie from [facility]  Callback Number: [phone] Service Requested: Physical Therapy Frequency: 1 week 8 - Monday 06/30 Any new concerns about the patient? No

## 2023-07-30 LAB — MICROALBUMIN / CREATININE URINE RATIO
Creatinine, Urine: 220.6 mg/dL
Microalb/Creat Ratio: 387 mg/g{creat} — ABNORMAL HIGH (ref 0–29)
Microalbumin, Urine: 853.3 ug/mL

## 2023-08-01 ENCOUNTER — Other Ambulatory Visit: Payer: Self-pay

## 2023-08-01 ENCOUNTER — Other Ambulatory Visit (INDEPENDENT_AMBULATORY_CARE_PROVIDER_SITE_OTHER): Payer: Self-pay

## 2023-08-01 ENCOUNTER — Other Ambulatory Visit: Payer: Self-pay | Admitting: Orthopaedic Surgery

## 2023-08-01 ENCOUNTER — Telehealth: Payer: Self-pay

## 2023-08-01 DIAGNOSIS — G8929 Other chronic pain: Secondary | ICD-10-CM | POA: Diagnosis not present

## 2023-08-01 DIAGNOSIS — M25561 Pain in right knee: Secondary | ICD-10-CM

## 2023-08-01 NOTE — Progress Notes (Signed)
 The patient is a 73 year old female who sustained a periprosthetic fracture of the right distal femur back in late March of this year.  She underwent open reduction/intra fixation of the fracture with lateral plating.  She has knee replacement on that side and the knee replacement was well-fixed.  She is only been touchdown weightbearing and recently plain films were concerning for a nonunion.  It is now been over 3 months since her surgery and a CT scan was ordered to further assess the fracture.  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  Given that is been over 12 weeks now since surgery and the CT scan and plain films are consistent with a nonunion of her fracture, and ultrasound blood stimulator is warranted at this point to see if this could contribute to helping with bone healing.  We believe at this point it is medically necessary given her clinical exam findings of pain combined with imaging studies after 12 weeks

## 2023-08-01 NOTE — Telephone Encounter (Signed)
 We need to dictate on xrays she had today for Kenny/Bone stim

## 2023-08-02 ENCOUNTER — Other Ambulatory Visit: Payer: Self-pay | Admitting: Orthopaedic Surgery

## 2023-08-02 ENCOUNTER — Ambulatory Visit

## 2023-08-02 ENCOUNTER — Telehealth: Payer: Self-pay

## 2023-08-02 MED ORDER — TRAMADOL HCL 50 MG PO TABS: 50.0000 mg | ORAL_TABLET | Freq: Four times a day (QID) | ORAL | 0 refills | Status: AC | PRN

## 2023-08-02 NOTE — Telephone Encounter (Signed)
 Refill request for Tramadol  [facility]

## 2023-08-03 DIAGNOSIS — E1142 Type 2 diabetes mellitus with diabetic polyneuropathy: Secondary | ICD-10-CM | POA: Diagnosis not present

## 2023-08-03 DIAGNOSIS — E1169 Type 2 diabetes mellitus with other specified complication: Secondary | ICD-10-CM | POA: Diagnosis not present

## 2023-08-03 DIAGNOSIS — M9701XD Periprosthetic fracture around internal prosthetic right hip joint, subsequent encounter: Secondary | ICD-10-CM | POA: Diagnosis not present

## 2023-08-03 DIAGNOSIS — S82831D Other fracture of upper and lower end of right fibula, subsequent encounter for closed fracture with routine healing: Secondary | ICD-10-CM | POA: Diagnosis not present

## 2023-08-03 DIAGNOSIS — S72491D Other fracture of lower end of right femur, subsequent encounter for closed fracture with routine healing: Secondary | ICD-10-CM | POA: Diagnosis not present

## 2023-08-03 DIAGNOSIS — M9711XD Periprosthetic fracture around internal prosthetic right knee joint, subsequent encounter: Secondary | ICD-10-CM | POA: Diagnosis not present

## 2023-08-10 DIAGNOSIS — M9701XD Periprosthetic fracture around internal prosthetic right hip joint, subsequent encounter: Secondary | ICD-10-CM | POA: Diagnosis not present

## 2023-08-10 DIAGNOSIS — S72491D Other fracture of lower end of right femur, subsequent encounter for closed fracture with routine healing: Secondary | ICD-10-CM | POA: Diagnosis not present

## 2023-08-10 DIAGNOSIS — S82831D Other fracture of upper and lower end of right fibula, subsequent encounter for closed fracture with routine healing: Secondary | ICD-10-CM | POA: Diagnosis not present

## 2023-08-10 DIAGNOSIS — E1142 Type 2 diabetes mellitus with diabetic polyneuropathy: Secondary | ICD-10-CM | POA: Diagnosis not present

## 2023-08-10 DIAGNOSIS — E1169 Type 2 diabetes mellitus with other specified complication: Secondary | ICD-10-CM | POA: Diagnosis not present

## 2023-08-10 DIAGNOSIS — M9711XD Periprosthetic fracture around internal prosthetic right knee joint, subsequent encounter: Secondary | ICD-10-CM | POA: Diagnosis not present

## 2023-08-12 ENCOUNTER — Other Ambulatory Visit: Payer: Self-pay | Admitting: Internal Medicine

## 2023-08-16 ENCOUNTER — Other Ambulatory Visit: Payer: Self-pay | Admitting: Internal Medicine

## 2023-08-16 DIAGNOSIS — M9711XD Periprosthetic fracture around internal prosthetic right knee joint, subsequent encounter: Secondary | ICD-10-CM | POA: Diagnosis not present

## 2023-08-16 DIAGNOSIS — S82831D Other fracture of upper and lower end of right fibula, subsequent encounter for closed fracture with routine healing: Secondary | ICD-10-CM | POA: Diagnosis not present

## 2023-08-16 DIAGNOSIS — S72491D Other fracture of lower end of right femur, subsequent encounter for closed fracture with routine healing: Secondary | ICD-10-CM | POA: Diagnosis not present

## 2023-08-16 DIAGNOSIS — E1142 Type 2 diabetes mellitus with diabetic polyneuropathy: Secondary | ICD-10-CM | POA: Diagnosis not present

## 2023-08-16 DIAGNOSIS — E1169 Type 2 diabetes mellitus with other specified complication: Secondary | ICD-10-CM | POA: Diagnosis not present

## 2023-08-16 DIAGNOSIS — M9701XD Periprosthetic fracture around internal prosthetic right hip joint, subsequent encounter: Secondary | ICD-10-CM | POA: Diagnosis not present

## 2023-08-17 ENCOUNTER — Other Ambulatory Visit: Payer: Self-pay | Admitting: Internal Medicine

## 2023-08-17 DIAGNOSIS — F3340 Major depressive disorder, recurrent, in remission, unspecified: Secondary | ICD-10-CM

## 2023-08-19 ENCOUNTER — Other Ambulatory Visit: Payer: Self-pay | Admitting: Internal Medicine

## 2023-08-19 DIAGNOSIS — K7460 Unspecified cirrhosis of liver: Secondary | ICD-10-CM | POA: Diagnosis not present

## 2023-08-19 DIAGNOSIS — E1142 Type 2 diabetes mellitus with diabetic polyneuropathy: Secondary | ICD-10-CM | POA: Diagnosis not present

## 2023-08-19 DIAGNOSIS — E559 Vitamin D deficiency, unspecified: Secondary | ICD-10-CM | POA: Diagnosis not present

## 2023-08-19 DIAGNOSIS — I10 Essential (primary) hypertension: Secondary | ICD-10-CM | POA: Diagnosis not present

## 2023-08-19 DIAGNOSIS — E118 Type 2 diabetes mellitus with unspecified complications: Secondary | ICD-10-CM | POA: Diagnosis not present

## 2023-08-22 ENCOUNTER — Other Ambulatory Visit: Payer: Self-pay

## 2023-08-24 DIAGNOSIS — M9711XD Periprosthetic fracture around internal prosthetic right knee joint, subsequent encounter: Secondary | ICD-10-CM | POA: Diagnosis not present

## 2023-08-24 DIAGNOSIS — E1142 Type 2 diabetes mellitus with diabetic polyneuropathy: Secondary | ICD-10-CM | POA: Diagnosis not present

## 2023-08-24 DIAGNOSIS — M9701XD Periprosthetic fracture around internal prosthetic right hip joint, subsequent encounter: Secondary | ICD-10-CM | POA: Diagnosis not present

## 2023-08-24 DIAGNOSIS — E1169 Type 2 diabetes mellitus with other specified complication: Secondary | ICD-10-CM | POA: Diagnosis not present

## 2023-08-24 DIAGNOSIS — S72491D Other fracture of lower end of right femur, subsequent encounter for closed fracture with routine healing: Secondary | ICD-10-CM | POA: Diagnosis not present

## 2023-08-24 DIAGNOSIS — S82831D Other fracture of upper and lower end of right fibula, subsequent encounter for closed fracture with routine healing: Secondary | ICD-10-CM | POA: Diagnosis not present

## 2023-08-25 ENCOUNTER — Ambulatory Visit: Payer: Self-pay

## 2023-08-25 ENCOUNTER — Other Ambulatory Visit: Payer: Self-pay | Admitting: Internal Medicine

## 2023-08-25 NOTE — Telephone Encounter (Signed)
 FYI Only or Action Required?: FYI only for provider.  Patient was last seen in primary care on 07/28/2023 by Tobie [name], MD.  Called Nurse Triage reporting Diarrhea and Vomiting.  Symptoms began several days ago.  Interventions attempted: OTC medications: Imodium and Rest, hydration, or home remedies.  Symptoms are: gradually worsening.  Triage Disposition: See Physician Within 24 Hours  Patient/caregiver understands and will follow disposition?: Yes  Message from Ameerah G sent at 08/25/2023  9:01 AM EDT  Diarrhea/vomiting due to medication [phone]   Reason for Disposition  [1] MODERATE diarrhea (e.g., 4-6 times / day more than normal) AND [2] present > 48 hours (2 days)  Answer Assessment - Initial Assessment Questions 1. DIARRHEA SEVERITY: How bad is the diarrhea? How many more stools have you had in the past 24 hours than normal?      3-4 2. ONSET: When did the diarrhea begin?      Saturday 3. STOOL DESCRIPTION:  How loose or watery is the diarrhea? What is the stool color? Is there any blood or mucous in the stool?     Watery, semi solid today 4. VOMITING: Are you also vomiting? If Yes, ask: How many times in the past 24 hours?      Yes-started today  5. ABDOMEN PAIN: Are you having any abdomen pain? If Yes, ask: What does it feel like? (e.g., crampy, dull, intermittent, constant)      Yes-gassy 6. ABDOMEN PAIN SEVERITY: If present, ask: How bad is the pain?  (e.g., Scale 1-10; mild, moderate, or severe)      Intermittent moderate. Will swell with gas, subsides with passing gas.  7. ORAL INTAKE: If vomiting, Have you been able to drink liquids? How much liquids have you had in the past 24 hours?     Yes 8. HYDRATION: Any signs of dehydration? (e.g., dry mouth [not just dry lips], too weak to stand, dizziness, new weight loss) When did you last urinate? Yes, hydrated      9. EXPOSURE: Have you traveled to a foreign country  recently? Have you been exposed to anyone with diarrhea? Could you have eaten any food that was spoiled?       No 10. ANTIBIOTIC USE: Are you taking antibiotics now or have you taken antibiotics in the past 2 months?       No  11. OTHER SYMPTOMS: Do you have any other symptoms? (e.g., fever, blood in stool)       Belching   Imodium with minimal effect.  Protocols used: [facility]

## 2023-08-26 ENCOUNTER — Ambulatory Visit

## 2023-08-26 ENCOUNTER — Other Ambulatory Visit: Payer: Self-pay

## 2023-08-26 VITALS — BP 136/82 | HR 85 | Ht 61.0 in | Wt 240.0 lb

## 2023-08-26 DIAGNOSIS — K2900 Acute gastritis without bleeding: Secondary | ICD-10-CM | POA: Diagnosis not present

## 2023-08-26 MED ORDER — SUCRALFATE 1 G PO TABS: 1.0000 g | ORAL_TABLET | Freq: Three times a day (TID) | ORAL | 0 refills | Status: AC

## 2023-08-26 MED ORDER — SUCRALFATE 1 GM/10ML PO SUSP: 1.0000 g | Freq: Three times a day (TID) | ORAL | 0 refills | Status: DC

## 2023-08-26 MED ORDER — CIPROFLOXACIN HCL 500 MG PO TABS
500.0000 mg | ORAL_TABLET | Freq: Two times a day (BID) | ORAL | 0 refills | Status: AC
Start: 2023-08-26 — End: 2023-08-29

## 2023-08-26 NOTE — Progress Notes (Signed)
   Established Patient Office Visit  Subjective   Patient ID: [name] [name], female    DOB: 1950-10-18  Age: 73 y.o. MRN: [id number]  Chief Complaint  Patient presents with   Medical Management of Chronic Issues    Upset stomach,Diarrhea,Vomiting since saturday    Emesis  This is a new problem. The current episode started in the past 7 days. The problem has been gradually improving. The emesis has an appearance of stomach contents. There has been no fever. Associated symptoms include chills and diarrhea. Pertinent negatives include no abdominal pain, arthralgias, chest pain, dizziness, fever, headaches or myalgias. Risk factors include suspect food intake. She has tried increased fluids and diet change for the symptoms. The treatment provided mild relief.       Review of Systems  Constitutional:  Positive for chills. Negative for fever.  Cardiovascular:  Negative for chest pain.  Gastrointestinal:  Positive for diarrhea and vomiting. Negative for abdominal pain.  Musculoskeletal:  Negative for arthralgias and myalgias.  Neurological:  Negative for dizziness and headaches.      Objective:     BP 136/82   Pulse 85   Ht 5' 1 (1.549 m)   Wt 240 lb (108.9 kg)   SpO2 96%   BMI 45.35 kg/m    Physical Exam Vitals and nursing note reviewed.  Constitutional:      Appearance: Normal appearance.  HENT:     Head: Normocephalic.  Eyes:     Extraocular Movements: Extraocular movements intact.     Pupils: Pupils are equal, round, and reactive to light.  Cardiovascular:     Rate and Rhythm: Normal rate and regular rhythm.  Pulmonary:     Effort: Pulmonary effort is normal.     Breath sounds: Normal breath sounds.  Abdominal:     General: Bowel sounds are normal.     Palpations: Abdomen is soft.     Tenderness: There is no abdominal tenderness. There is no right CVA tenderness, left CVA tenderness, guarding or rebound.     Hernia: No hernia is present.  Musculoskeletal:      Cervical back: Normal range of motion and neck supple.  Neurological:     Mental Status: She is alert and oriented to person, place, and time.  Psychiatric:        Mood and Affect: Mood normal.        Thought Content: Thought content normal.      No results found for any visits on 08/26/23.    The 10-year ASCVD risk score (Arnett DK, et al., 2019) is: 32.8%    Assessment & Plan:   Problem List Items Addressed This Visit   None Visit Diagnoses       Acute gastritis without hemorrhage, unspecified gastritis type    -  Primary   Symptoms seem to be improving.  Recommend brat diet, increase fluids and rest.  Follow-up if no improvement or if symptoms worsen.       No follow-ups on file.    Leita Longs, FNP

## 2023-08-28 DIAGNOSIS — Z556 Problems related to health literacy: Secondary | ICD-10-CM | POA: Diagnosis not present

## 2023-08-28 DIAGNOSIS — I10 Essential (primary) hypertension: Secondary | ICD-10-CM | POA: Diagnosis not present

## 2023-08-28 DIAGNOSIS — M150 Primary generalized (osteo)arthritis: Secondary | ICD-10-CM | POA: Diagnosis not present

## 2023-08-28 DIAGNOSIS — Z993 Dependence on wheelchair: Secondary | ICD-10-CM | POA: Diagnosis not present

## 2023-08-28 DIAGNOSIS — D630 Anemia in neoplastic disease: Secondary | ICD-10-CM | POA: Diagnosis not present

## 2023-08-28 DIAGNOSIS — Z6841 Body Mass Index (BMI) 40.0 and over, adult: Secondary | ICD-10-CM | POA: Diagnosis not present

## 2023-08-28 DIAGNOSIS — M9701XD Periprosthetic fracture around internal prosthetic right hip joint, subsequent encounter: Secondary | ICD-10-CM | POA: Diagnosis not present

## 2023-08-28 DIAGNOSIS — M109 Gout, unspecified: Secondary | ICD-10-CM | POA: Diagnosis not present

## 2023-08-28 DIAGNOSIS — L121 Cicatricial pemphigoid: Secondary | ICD-10-CM | POA: Diagnosis not present

## 2023-08-28 DIAGNOSIS — S82831D Other fracture of upper and lower end of right fibula, subsequent encounter for closed fracture with routine healing: Secondary | ICD-10-CM | POA: Diagnosis not present

## 2023-08-28 DIAGNOSIS — E1169 Type 2 diabetes mellitus with other specified complication: Secondary | ICD-10-CM | POA: Diagnosis not present

## 2023-08-28 DIAGNOSIS — S72491D Other fracture of lower end of right femur, subsequent encounter for closed fracture with routine healing: Secondary | ICD-10-CM | POA: Diagnosis not present

## 2023-08-28 DIAGNOSIS — M9711XD Periprosthetic fracture around internal prosthetic right knee joint, subsequent encounter: Secondary | ICD-10-CM | POA: Diagnosis not present

## 2023-08-28 DIAGNOSIS — E1142 Type 2 diabetes mellitus with diabetic polyneuropathy: Secondary | ICD-10-CM | POA: Diagnosis not present

## 2023-08-28 DIAGNOSIS — F419 Anxiety disorder, unspecified: Secondary | ICD-10-CM | POA: Diagnosis not present

## 2023-08-28 DIAGNOSIS — E785 Hyperlipidemia, unspecified: Secondary | ICD-10-CM | POA: Diagnosis not present

## 2023-08-28 DIAGNOSIS — F3340 Major depressive disorder, recurrent, in remission, unspecified: Secondary | ICD-10-CM | POA: Diagnosis not present

## 2023-08-28 DIAGNOSIS — C50412 Malignant neoplasm of upper-outer quadrant of left female breast: Secondary | ICD-10-CM | POA: Diagnosis not present

## 2023-08-28 DIAGNOSIS — R296 Repeated falls: Secondary | ICD-10-CM | POA: Diagnosis not present

## 2023-08-28 DIAGNOSIS — E66813 Obesity, class 3: Secondary | ICD-10-CM | POA: Diagnosis not present

## 2023-08-28 DIAGNOSIS — D696 Thrombocytopenia, unspecified: Secondary | ICD-10-CM | POA: Diagnosis not present

## 2023-08-28 DIAGNOSIS — G4733 Obstructive sleep apnea (adult) (pediatric): Secondary | ICD-10-CM | POA: Diagnosis not present

## 2023-08-28 DIAGNOSIS — E039 Hypothyroidism, unspecified: Secondary | ICD-10-CM | POA: Diagnosis not present

## 2023-08-28 DIAGNOSIS — K7460 Unspecified cirrhosis of liver: Secondary | ICD-10-CM | POA: Diagnosis not present

## 2023-08-28 DIAGNOSIS — Z794 Long term (current) use of insulin: Secondary | ICD-10-CM | POA: Diagnosis not present

## 2023-08-31 DIAGNOSIS — M9711XD Periprosthetic fracture around internal prosthetic right knee joint, subsequent encounter: Secondary | ICD-10-CM | POA: Diagnosis not present

## 2023-08-31 DIAGNOSIS — M9701XD Periprosthetic fracture around internal prosthetic right hip joint, subsequent encounter: Secondary | ICD-10-CM | POA: Diagnosis not present

## 2023-08-31 DIAGNOSIS — S82831D Other fracture of upper and lower end of right fibula, subsequent encounter for closed fracture with routine healing: Secondary | ICD-10-CM | POA: Diagnosis not present

## 2023-08-31 DIAGNOSIS — E1142 Type 2 diabetes mellitus with diabetic polyneuropathy: Secondary | ICD-10-CM | POA: Diagnosis not present

## 2023-08-31 DIAGNOSIS — S72491D Other fracture of lower end of right femur, subsequent encounter for closed fracture with routine healing: Secondary | ICD-10-CM | POA: Diagnosis not present

## 2023-08-31 DIAGNOSIS — E1169 Type 2 diabetes mellitus with other specified complication: Secondary | ICD-10-CM | POA: Diagnosis not present

## 2023-09-08 ENCOUNTER — Other Ambulatory Visit (INDEPENDENT_AMBULATORY_CARE_PROVIDER_SITE_OTHER): Payer: Self-pay

## 2023-09-08 ENCOUNTER — Other Ambulatory Visit (INDEPENDENT_AMBULATORY_CARE_PROVIDER_SITE_OTHER): Payer: Self-pay | Admitting: Gastroenterology

## 2023-09-08 ENCOUNTER — Telehealth (INDEPENDENT_AMBULATORY_CARE_PROVIDER_SITE_OTHER): Payer: Self-pay | Admitting: Gastroenterology

## 2023-09-08 ENCOUNTER — Other Ambulatory Visit: Payer: Self-pay | Admitting: Gastroenterology

## 2023-09-08 DIAGNOSIS — S82831D Other fracture of upper and lower end of right fibula, subsequent encounter for closed fracture with routine healing: Secondary | ICD-10-CM | POA: Diagnosis not present

## 2023-09-08 DIAGNOSIS — R109 Unspecified abdominal pain: Secondary | ICD-10-CM

## 2023-09-08 DIAGNOSIS — R112 Nausea with vomiting, unspecified: Secondary | ICD-10-CM

## 2023-09-08 DIAGNOSIS — E1169 Type 2 diabetes mellitus with other specified complication: Secondary | ICD-10-CM | POA: Diagnosis not present

## 2023-09-08 DIAGNOSIS — S72491D Other fracture of lower end of right femur, subsequent encounter for closed fracture with routine healing: Secondary | ICD-10-CM | POA: Diagnosis not present

## 2023-09-08 DIAGNOSIS — E1142 Type 2 diabetes mellitus with diabetic polyneuropathy: Secondary | ICD-10-CM | POA: Diagnosis not present

## 2023-09-08 DIAGNOSIS — K2980 Duodenitis without bleeding: Secondary | ICD-10-CM

## 2023-09-08 DIAGNOSIS — M9701XD Periprosthetic fracture around internal prosthetic right hip joint, subsequent encounter: Secondary | ICD-10-CM | POA: Diagnosis not present

## 2023-09-08 DIAGNOSIS — R197 Diarrhea, unspecified: Secondary | ICD-10-CM

## 2023-09-08 DIAGNOSIS — M9711XD Periprosthetic fracture around internal prosthetic right knee joint, subsequent encounter: Secondary | ICD-10-CM | POA: Diagnosis not present

## 2023-09-08 MED ORDER — ONDANSETRON HCL 4 MG PO TABS: 4.0000 mg | ORAL_TABLET | Freq: Three times a day (TID) | ORAL | 1 refills | Status: DC | PRN

## 2023-09-08 NOTE — Telephone Encounter (Signed)
 I spoke with the patient and made her aware per Dr. Eartha,  The patient has not been seen since September .  She needs a follow-up appointment of last year and next available.  Please send her an order for C. difficile and GI pathogen panel.  Will send her Zofran  as needed for nausea and vomiting.  If she is presenting severe symptoms she should go to the ER.  She should also follow closely with her PCP.  Patient states understanding and will go to [facility] to pick up the containers and perform the testing and take back to [facility]. Patient aware to go to the Ed if severe symptoms, and or follow up with Dr. Tobie. Patient states understanding.

## 2023-09-08 NOTE — Telephone Encounter (Signed)
 The patient has not been seen since September .  She needs a follow-up appointment of last year and next available.  Please send her an order for C. difficile and GI pathogen panel.  Will send her Zofran  as needed for nausea and vomiting.  If she is presenting severe symptoms she should go to the ER.  She should also follow closely with her PCP.

## 2023-09-08 NOTE — Telephone Encounter (Addendum)
 I spoke with the patient and she says she has had abdominal pain, nausea, vomiting and diarrhea off and on for the last two weeks. She says she vomits around twice per day and on Monday she says she saw some bright red blood in her vomit, but has frequent nose bleeds so contributed this to the nose bleeds. She is having 4-5 loose stools per day, denies any sight of blood or dark stools, no fever. She has an appointment here with us  on 09/26/2023, nothing available before then. [facility] [address]. Please advise.

## 2023-09-08 NOTE — Telephone Encounter (Signed)
 Patient called to make OV for abd pain, bloating, N/V/D. She is asking what can she take in the meantime for bloating. [facility] [address]. I also added her to cancellation list. [phone]

## 2023-09-14 ENCOUNTER — Encounter: Payer: Self-pay | Admitting: Orthopaedic Surgery

## 2023-09-14 ENCOUNTER — Ambulatory Visit: Admitting: Orthopaedic Surgery

## 2023-09-14 ENCOUNTER — Other Ambulatory Visit (INDEPENDENT_AMBULATORY_CARE_PROVIDER_SITE_OTHER): Payer: Self-pay

## 2023-09-14 DIAGNOSIS — Z8781 Personal history of (healed) traumatic fracture: Secondary | ICD-10-CM

## 2023-09-14 DIAGNOSIS — Z9889 Other specified postprocedural states: Secondary | ICD-10-CM | POA: Diagnosis not present

## 2023-09-14 NOTE — Progress Notes (Signed)
 [name] [name] comes in today getting close to 5 months status post a distal femur fracture of her right knee that was at the level of her total knee replacement.  This was treated with a lateral plate and screws and this was appropriate surgery.  After 3 months she developed a nonunion send now she has a bone stimulator.  She does have home therapy at home and has been only toe-touch weightbearing.  She will report some mild pain.  Examination shows her incisions of all healed nicely.  There is no significant swelling or redness.  She has good range of motion of her right knee but it does hurt at the fracture site.  2 views today of the right distal femur shows some bone healing but still evidence of a nonunion.  I do feel that some weightbearing could help with generating some bone healing.  She will continue the bone stimulator and I would like her to try up to 50% of weight on her right lower extremity.  Will then repeat x-rays in 4 weeks from now.  She should certainly consider supplements with vitamin D and calcium  as well.  She will continue again the bone stimulator.  I will need to reach out to colleagues about potential for distal femur replacement which Failla be in her future.  They understand this as well.

## 2023-09-16 DIAGNOSIS — M9701XD Periprosthetic fracture around internal prosthetic right hip joint, subsequent encounter: Secondary | ICD-10-CM | POA: Diagnosis not present

## 2023-09-16 DIAGNOSIS — S82831D Other fracture of upper and lower end of right fibula, subsequent encounter for closed fracture with routine healing: Secondary | ICD-10-CM | POA: Diagnosis not present

## 2023-09-16 DIAGNOSIS — S72491D Other fracture of lower end of right femur, subsequent encounter for closed fracture with routine healing: Secondary | ICD-10-CM | POA: Diagnosis not present

## 2023-09-16 DIAGNOSIS — E1142 Type 2 diabetes mellitus with diabetic polyneuropathy: Secondary | ICD-10-CM | POA: Diagnosis not present

## 2023-09-16 DIAGNOSIS — M9711XD Periprosthetic fracture around internal prosthetic right knee joint, subsequent encounter: Secondary | ICD-10-CM | POA: Diagnosis not present

## 2023-09-16 DIAGNOSIS — E1169 Type 2 diabetes mellitus with other specified complication: Secondary | ICD-10-CM | POA: Diagnosis not present

## 2023-09-22 DIAGNOSIS — E1169 Type 2 diabetes mellitus with other specified complication: Secondary | ICD-10-CM | POA: Diagnosis not present

## 2023-09-22 DIAGNOSIS — E1142 Type 2 diabetes mellitus with diabetic polyneuropathy: Secondary | ICD-10-CM | POA: Diagnosis not present

## 2023-09-22 DIAGNOSIS — M9711XD Periprosthetic fracture around internal prosthetic right knee joint, subsequent encounter: Secondary | ICD-10-CM | POA: Diagnosis not present

## 2023-09-22 DIAGNOSIS — M9701XD Periprosthetic fracture around internal prosthetic right hip joint, subsequent encounter: Secondary | ICD-10-CM | POA: Diagnosis not present

## 2023-09-22 DIAGNOSIS — S82831D Other fracture of upper and lower end of right fibula, subsequent encounter for closed fracture with routine healing: Secondary | ICD-10-CM | POA: Diagnosis not present

## 2023-09-22 DIAGNOSIS — S72491D Other fracture of lower end of right femur, subsequent encounter for closed fracture with routine healing: Secondary | ICD-10-CM | POA: Diagnosis not present

## 2023-09-26 ENCOUNTER — Telehealth: Payer: Self-pay | Admitting: *Deleted

## 2023-09-26 ENCOUNTER — Encounter (INDEPENDENT_AMBULATORY_CARE_PROVIDER_SITE_OTHER): Payer: Self-pay | Admitting: Gastroenterology

## 2023-09-26 ENCOUNTER — Ambulatory Visit (INDEPENDENT_AMBULATORY_CARE_PROVIDER_SITE_OTHER): Admitting: Gastroenterology

## 2023-09-26 VITALS — BP 126/76 | HR 71 | Temp 97.5°F | Ht 61.0 in | Wt 240.0 lb

## 2023-09-26 DIAGNOSIS — R1033 Periumbilical pain: Secondary | ICD-10-CM | POA: Diagnosis not present

## 2023-09-26 DIAGNOSIS — K7581 Nonalcoholic steatohepatitis (NASH): Secondary | ICD-10-CM | POA: Diagnosis not present

## 2023-09-26 DIAGNOSIS — R140 Abdominal distension (gaseous): Secondary | ICD-10-CM | POA: Diagnosis not present

## 2023-09-26 DIAGNOSIS — R109 Unspecified abdominal pain: Secondary | ICD-10-CM

## 2023-09-26 DIAGNOSIS — K7460 Unspecified cirrhosis of liver: Secondary | ICD-10-CM

## 2023-09-26 DIAGNOSIS — I8510 Secondary esophageal varices without bleeding: Secondary | ICD-10-CM

## 2023-09-26 MED ORDER — CARVEDILOL 6.25 MG PO TABS: 6.2500 mg | ORAL_TABLET | Freq: Two times a day (BID) | ORAL | 1 refills | Status: DC

## 2023-09-26 NOTE — Telephone Encounter (Signed)
 Pt wanted US  done at [facility] Called [place] [facility] and spoke with Hadassah. She is aware order in and will call patient to schedule.  Called nuc med, LMOVM schedule G[facility]

## 2023-09-26 NOTE — Patient Instructions (Signed)
-   Perform blood workup - Schedule gastric emptying study - Schedule liver US  - Reduce salt intake to <2 g per day - Can take Tylenol  max of 2 g per day (650 mg q8h) for pain - Avoid NSAIDs for pain - Avoid eating raw oysters/shellfish - Protein shake (Ensure or Boost) every night before going to sleep - Increase carvedilol  to 6.25 mg twice a day - Start IBGard 1 tablet every 8-12 hours as needed for abdominal discomort . Can use peppermint tea as well.

## 2023-09-26 NOTE — Telephone Encounter (Signed)
 LMOVM to call back to give [facility] appt, 8/27, arrival 7:45am, npo midnight and no stomach medications prior

## 2023-09-26 NOTE — Progress Notes (Unsigned)
 Toribio Fortune, M.D. Gastroenterology & Hepatology [facility] [address] [place], [place] [address]  Primary Care Physician: Tobie [name], MD [address] [place] [address]  I will communicate my assessment and recommendations to the referring MD via EMR.  Problems: NASH cirrhosis Abdominal bloating and pain  History of Present Illness: [name] [name] is a 73 y.o. female with past medical history of NASH cirrhosis, breast cancer status post right lumpectomy and chemotherapy, hypothyroidism, OSA, diabetes placated by neuropathy, gout, hypertension, who presents for evaluation of abdominal pain, bloating, and follow-up of cirrhosis.   The patient was last seen on 08/09/2022. At that time, the patient had labs and imaging performed for liver cirrhosis.  She was scheduled for EGD with findings described below.  Patient was started on carvedilol  after her last EGD.  Patient had a femur fracture in March 2025, and had to have a surgery so she had to cancel her appointment in our clinic.  Patient reports having intermittent episodes of abdominal pain in her mid abdomen and abdominal distention, along with burping foul smelling burps. It Tabone happen every 2 weeks. She Deery also have 3-4 episodes of diarrhea. Symptoms improve on its own but can last up to a week. It  Escareno have been happening for years before, but it worsened since she had her femur fracture.  The patient denies having any regular nausea, vomiting, fever, chills, hematochezia, melena, hematemesis, jaundice, pruritus. Lost a fair amount of weight while on rehab, but has gained it back.  Patient called to our office on 09/08/2023 reporting diarrhea and abdominal discomfort.  GI profile and C. difficile testing were ordered but they have not been performed.  Last imaging CT abdomen and pelvis with IV contrast on 07/08/2022 - changes of cirrhosis, and mild retroperitoneal stranding.  Moderate paraumbilical hernia and portosystemic collateral vessels.  Has been on Ozempic  for the last 3 years, no changes in her dosage recently.  Cirrhosis related questions: Hematemesis/coffee ground emesis: No Abdominal pain: Yes Abdominal distention/worsening ascites: /yes to intermittent distention Fever/chills: No Episodes of confusion/disorientation: No Taking diuretics?: No History of variceal bleeding: No Prior history of banding?: No Prior episodes of SBP: No Last time liver imaging was performed: 08/19/2022, no liver masses on ultrasound Last AFP: 08/09/22 - 5.8 MELD 3.0 score:  08/09/22 - 9 Currently consuming alcohol: No   Last EGD: 10/19/2022 Grade 2 esophageal varices, normal stomach.  Duodenal nodule (peptic duodenitis).  Last Colonoscopy was performed on 03/07/2014 which showed diverticulosis throughout the colon and friability in the ascending colon. Advised to repeat colonoscopy in 10 years.   Past Medical History: Past Medical History:  Diagnosis Date   Abdominal wall contusion 12/09/2011   Achilles tendon contracture, bilateral 06/03/2016   Anxiety    Arthritis    Blood dyscrasia    low platelet count- sees [name] , Dr. Eleanor Lily ( Note in EPIC from 07/2014) at [facility]   Breast cancer [facility]) 2017   Left Breast   Breast cancer (HCC) 07/2021   right breast IDC   Cancer (HCC)    left breast   Chest wall contusion 12/09/2011   Cirrhosis of liver (HCC)    Cough    Diabetes mellitus without complication (HCC)    Diabetic neuropathy (HCC)    Diarrhea due to drug 01/29/2021   Dyspnea    Gout    Gout    History of radiation therapy 10/22/15 - 12/08/15   left  breast 50.4 Gy, boost to 10 Gy   History of radiation therapy    Right breast- 09/22/21-10/30/21- Dr. Lynwood Nasuti   Hypertension    Hypothyroidism    Migraine    Multifactorial gait disorder 01/12/2013   Neuromuscular disorder (HCC)    diabetic neuropathy   Neuropathy    Personal  history of radiation therapy 2017   Left Breast Cancer   Posterior tibial tendon dysfunction (PTTD) of right lower extremity 10/09/2019   Sleep apnea    CPAP nightly   Spleen enlarged    Thyroid  disease     Past Surgical History: Past Surgical History:  Procedure Laterality Date   ANKLE FUSION Right 2022   BIOPSY  10/19/2022   Procedure: BIOPSY;  Surgeon: Eartha Angelia Sieving, MD;  [facility];  Service: Gastroenterology;;   BREAST BIOPSY Left 08/04/2015   Procedure: LEFT BREAST BIOPSY WITH NEEDLE LOCALIZATION;  Surgeon: Krystal Spinner, MD;  Location: [facility];  Service: General;  Laterality: Left;   BREAST BIOPSY Right 06/23/2021   BREAST DUCTAL SYSTEM EXCISION Left 08/04/2015   Procedure: LEFT EXCISION DUCTAL SYSTEM BREAST;  Surgeon: Krystal Spinner, MD;  Location: [facility];  Service: General;  Laterality: Left;   BREAST LUMPECTOMY Left 08/2015   BREAST LUMPECTOMY Right 08/2021   BREAST LUMPECTOMY WITH AXILLARY LYMPH NODE BIOPSY Left 09/12/2015   Procedure: RE-EXCISION OF LEFT BREAST LUMPECTOMY WITH LEFT AXILLARY LYMPH NODE BIOPSY;  Surgeon: Krystal Spinner, MD;  Location:  SURGERY CENTER;  Service: General;  Laterality: Left;   BREAST LUMPECTOMY WITH RADIOACTIVE SEED LOCALIZATION Right 08/12/2021   Procedure: RIGHT BREAST LUMPECTOMY WITH RADIOACTIVE SEED LOCALIZATION;  Surgeon: Vernetta Berg, MD;  Location:  SURGERY CENTER;  Service: General;  Laterality: Right;  LMA   CHOLECYSTECTOMY     ESOPHAGOGASTRODUODENOSCOPY (EGD) WITH PROPOFOL  N/A 10/19/2022   Procedure: ESOPHAGOGASTRODUODENOSCOPY (EGD) WITH PROPOFOL ;  Surgeon: Eartha Angelia Sieving, MD;  Location: [facility];  Service: Gastroenterology;  Laterality: N/A;  10:30am asa 3   EYE SURGERY Bilateral 2022   Lashes growing backwards into the eye   JOINT REPLACEMENT     left knee   JOINT REPLACEMENT  2017   right knee   ORIF FEMUR FRACTURE Right 05/02/2023    Procedure: OPEN REDUCTION INTERNAL FIXATION (ORIF) DISTAL FEMUR FRACTURE;  Surgeon: Genelle Standing, MD;  Location: [facility];  Service: Orthopedics;  Laterality: Right;   REVERSE SHOULDER ARTHROPLASTY Right 09/07/2018   Procedure: RIGHT REVERSE SHOULDER ARTHROPLASTY;  Surgeon: Addie Cordella Hamilton, MD;  Location: [facility];  Service: Orthopedics;  Laterality: Right;   TOTAL KNEE ARTHROPLASTY Right 02/28/2015   Procedure: RIGHT TOTAL KNEE ARTHROPLASTY;  Surgeon: Lonni CINDERELLA Vernetta, MD;  Location: [facility];  Service: Orthopedics;  Laterality: Right;   TUBAL LIGATION      Family History: Family History  Problem Relation Age of Onset   Hypothyroidism Mother    Alzheimer's disease Mother    Diabetes Father    Cancer Sister        GYN cancer   Hypothyroidism Sister    Heart disease Brother    Arthritis Son    Diabetes Son    Arthritis Son    Cancer Maternal Grandmother    Heart failure Maternal Grandfather    Bone cancer Paternal Grandmother    Heart failure Paternal Grandmother     Social History: Social History   Tobacco Use  Smoking Status Never   Passive exposure: Never  Smokeless Tobacco Never  Social History   Substance and Sexual Activity  Alcohol Use No   Social History   Substance and Sexual Activity  Drug Use No    Allergies: Allergies  Allergen Reactions   Codeine  Swelling   Influenza A (H1n1) Monoval Pf Anaphylaxis   Hydrocodone -Acetaminophen  Other (See Comments)    unknown   Lisinopril  Other (See Comments)    unknown   Metformin Other (See Comments)    unknown   Pregabalin Other (See Comments)    unknown   Jardiance [Empagliflozin] Other (See Comments)    dehydration    Medications: Current Outpatient Medications  Medication Sig Dispense Refill   ACCU-CHEK AVIVA PLUS test strip USE AS INSTRUCTED TO CHECK BLOOD SUGAR 2 TIMES DAILY 100 strip 5   acetaminophen  (TYLENOL ) 325 MG tablet Take 2 tablets (650 mg total) by mouth every 6 (six) hours as needed  for mild pain (pain score 1-3) or moderate pain (pain score 4-6) (or Fever >/= 101). 30 tablet 0   acetaminophen  (TYLENOL ) 500 MG tablet Take 500 mg by mouth every 8 (eight) hours as needed for moderate pain (pain score 4-6).     albuterol  (VENTOLIN  HFA) 108 (90 Base) MCG/ACT inhaler Inhale 1-2 puffs into the lungs every 4 (four) hours as needed for wheezing or shortness of breath. 18 g 2   Ascorbic Acid (VITAMIN C PO) Take 1 tablet by mouth daily.     buPROPion  (WELLBUTRIN  XL) 150 MG 24 hr tablet TAKE 1 TABLET BY MOUTH EVERY DAY IN THE MORNING 90 tablet 1   calcium  carbonate (OS-CAL - DOSED IN MG OF ELEMENTAL CALCIUM ) 1250 (500 Ca) MG tablet Take 1 tablet by mouth.     carvedilol  (COREG ) 3.125 MG tablet Take 1 tablet (3.125 mg total) by mouth 2 (two) times daily with a meal. 180 tablet 0   Cyanocobalamin  (VITAMIN B-12 PO) Take 1 tablet by mouth daily.     exemestane  (AROMASIN ) 25 MG tablet Take 1 tablet (25 mg total) by mouth daily after breakfast. 90 tablet 1   FARXIGA  10 MG TABS tablet TAKE 1 TABLET BY MOUTH DAILY BEFORE BREAKFAST. 90 tablet 3   fluticasone (FLONASE) 50 MCG/ACT nasal spray Place 1 spray into both nostrils daily as needed for allergies.     gabapentin  (NEURONTIN ) 600 MG tablet TAKE 1 TABLET IN THE MORNING AND 1 TABLET IN THE AFTERNOON AND 2TABS AT BEDTIME     insulin  glargine (LANTUS  SOLOSTAR) 100 UNIT/ML Solostar Pen Inject 62 Units into the skin daily. 30 mL 0   Insulin  Pen Needle (B-D UF III MINI PEN NEEDLES) 31G X 5 MM MISC USE AS DIRECTED WITH INSULIN  EVERY MORNING, NOON, EVENING AND AT BEDTIME 400 each 3   Insulin  Pen Needle 30G X 5 MM MISC 1 Device by Does not apply route in the morning, at noon, in the evening, and at bedtime. 400 each 3   ketoconazole  (NIZORAL ) 2 % cream Apply 1 Application topically daily. 60 g 0   levothyroxine  (SYNTHROID ) 150 MCG tablet Take 1 tablet (150 mcg total) by mouth daily. 90 tablet 3   losartan  (COZAAR ) 50 MG tablet Take 1 tablet (50 mg  total) by mouth daily. 90 tablet 1   meclizine  (ANTIVERT ) 25 MG tablet Take 1 tablet (25 mg total) by mouth 3 (three) times daily as needed for dizziness. 30 tablet 0   methocarbamol  (ROBAXIN ) 500 MG tablet Take 1 tablet (500 mg total) by mouth every 8 (eight) hours as needed for muscle spasms. 30  tablet 0   mirabegron  ER (MYRBETRIQ ) 25 MG TB24 tablet Take 1 tablet (25 mg total) by mouth daily. 30 tablet 11   Misc. Devices MISC Blood pressure cuff/device - 1. ICD10: I10 1 each 0   Multiple Vitamins-Minerals (MULTIVITAMIN WITH MINERALS) tablet Take 1 tablet by mouth daily.     mycophenolate  (CELLCEPT ) 500 MG tablet Take 1,500 mg by mouth 2 (two) times daily.     omeprazole  (PRILOSEC) 20 MG capsule TAKE 1 CAPSULE BY MOUTH EVERY DAY 90 capsule 3   ondansetron  (ZOFRAN ) 4 MG tablet Take 1 tablet (4 mg total) by mouth every 8 (eight) hours as needed for nausea or vomiting. 90 tablet 1   promethazine -dextromethorphan (PROMETHAZINE -DM) 6.25-15 MG/5ML syrup Take 5 mLs by mouth 4 (four) times daily as needed for cough. 180 mL 0   Pyridoxine HCl (VITAMIN B-6 PO) Take by mouth daily.     Semaglutide , 1 MG/DOSE, (OZEMPIC , 1 MG/DOSE,) 4 MG/3ML SOPN Inject 1 mg into the skin once a week. 3 mL 5   sucralfate  (CARAFATE ) 1 g tablet Take 1 tablet (1 g total) by mouth 4 (four) times daily -  with meals and at bedtime for 7 days. (Patient taking differently: Take 1 g by mouth 4 (four) times daily -  with meals and at bedtime. As needed.) 28 tablet 0   traMADol  (ULTRAM ) 50 MG tablet Take 1-2 tablets (50-100 mg total) by mouth every 6 (six) hours as needed. 30 tablet 0   traZODone  (DESYREL ) 50 MG tablet Take 0.5-1 tablets (25-50 mg total) by mouth at bedtime as needed for sleep. 30 tablet 3   VITAMIN D PO Take 1 tablet by mouth daily.     No current facility-administered medications for this visit.    Review of Systems: GENERAL: negative for malaise, night sweats HEENT: No changes in hearing or vision, no nose bleeds  or other nasal problems. NECK: Negative for lumps, goiter, pain and significant neck swelling RESPIRATORY: Negative for cough, wheezing CARDIOVASCULAR: Negative for chest pain, leg swelling, palpitations, orthopnea GI: SEE HPI MUSCULOSKELETAL: Negative for joint pain or swelling, back pain, and muscle pain. SKIN: Negative for lesions, rash PSYCH: Negative for sleep disturbance, mood disorder and recent psychosocial stressors. HEMATOLOGY Negative for prolonged bleeding, bruising easily, and swollen nodes. ENDOCRINE: Negative for cold or heat intolerance, polyuria, polydipsia and goiter. NEURO: negative for tremor, gait imbalance, syncope and seizures. The remainder of the review of systems is noncontributory.   Physical Exam: BP 126/76 (BP Location: Right Arm, Patient Position: Sitting, Cuff Size: Large)   Pulse 71   Temp (!) 97.5 F (36.4 C) (Temporal)   Ht 5' 1 (1.549 m)   Wt 240 lb (108.9 kg) Comment: weight per patient .  BMI 45.35 kg/m  GENERAL: The patient is AO x3, in no acute distress. Sitting in wheelchair. HEENT: Head is normocephalic and atraumatic. EOMI are intact. Mouth is well hydrated and without lesions. NECK: Supple. No masses LUNGS: Clear to auscultation. No presence of rhonchi/wheezing/rales. Adequate chest expansion HEART: RRR, normal s1 and s2. ABDOMEN: tender to palpation in epigastric area, no guarding, no peritoneal signs, and nondistended. BS +. No masses. EXTREMITIES: Without any cyanosis, clubbing, rash, lesions or edema. NEUROLOGIC: AOx3, no focal motor deficit. SKIN: no jaundice, no rashes  Imaging/Labs: as above  I personally reviewed and interpreted the available labs, imaging and endoscopic files.  Impression and Plan: [name] [name] is a 73 y.o. female with past medical history of NASH cirrhosis, breast cancer status  post right lumpectomy and chemotherapy, hypothyroidism, OSA, diabetes placated by neuropathy, gout, hypertension, who presents for  evaluation of abdominal pain, bloating, and follow-up of cirrhosis.  .  Patient has presented history of liver cirrhosis without decompensation but with portal hypertension as evidence by esophageal varices.  Has not presented any overt gastrointestinal bleeding episodes concerning for variceal bleeding.  This has been managed with carvedilol  and has tolerated this adequately.  She is not at goal yet, for which dosage will be increased to 6.25 mg twice a day.  Will obtain surveillance labs for liver cirrhosis and evaluate for immunity to hepatitis A/B.  She has not presented any signs of third spacing or hepatic encephalopathy.  She is due for [facility] screening, for which we will schedule ultrasound and check AFP.  She has presented intermittent episodes of abdominal bloating and abdominal pain.  Will evaluate this further with serologies for celiac disease and a gastric emptying study.  She can try taking IBgard as needed to relieve her symptoms.  - Check celiac disease, MELD labs, AFP, CBC, hepatitis A/B antibodies - Schedule gastric emptying study - Schedule liver US  - Reduce salt intake to <2 g per day - Can take Tylenol  max of 2 g per day (650 mg q8h) for pain - Avoid NSAIDs for pain - Avoid eating raw oysters/shellfish - Protein shake (Ensure or Boost) every night before going to sleep - Increase carvedilol  to 6.25 mg twice a day - Start IBGard 1 tablet every 8-12 hours as needed for abdominal discomort . Can use peppermint tea as well.  All questions were answered.      Toribio Fortune, MD Gastroenterology and Hepatology [facility]

## 2023-09-27 ENCOUNTER — Ambulatory Visit: Payer: Self-pay | Admitting: Gastroenterology

## 2023-09-27 DIAGNOSIS — Z794 Long term (current) use of insulin: Secondary | ICD-10-CM | POA: Diagnosis not present

## 2023-09-27 DIAGNOSIS — D696 Thrombocytopenia, unspecified: Secondary | ICD-10-CM | POA: Diagnosis not present

## 2023-09-27 DIAGNOSIS — S82831D Other fracture of upper and lower end of right fibula, subsequent encounter for closed fracture with routine healing: Secondary | ICD-10-CM | POA: Diagnosis not present

## 2023-09-27 DIAGNOSIS — M9711XD Periprosthetic fracture around internal prosthetic right knee joint, subsequent encounter: Secondary | ICD-10-CM | POA: Diagnosis not present

## 2023-09-27 DIAGNOSIS — G4733 Obstructive sleep apnea (adult) (pediatric): Secondary | ICD-10-CM | POA: Diagnosis not present

## 2023-09-27 DIAGNOSIS — F3340 Major depressive disorder, recurrent, in remission, unspecified: Secondary | ICD-10-CM | POA: Diagnosis not present

## 2023-09-27 DIAGNOSIS — E039 Hypothyroidism, unspecified: Secondary | ICD-10-CM | POA: Diagnosis not present

## 2023-09-27 DIAGNOSIS — Z993 Dependence on wheelchair: Secondary | ICD-10-CM | POA: Diagnosis not present

## 2023-09-27 DIAGNOSIS — R296 Repeated falls: Secondary | ICD-10-CM | POA: Diagnosis not present

## 2023-09-27 DIAGNOSIS — M109 Gout, unspecified: Secondary | ICD-10-CM | POA: Diagnosis not present

## 2023-09-27 DIAGNOSIS — F419 Anxiety disorder, unspecified: Secondary | ICD-10-CM | POA: Diagnosis not present

## 2023-09-27 DIAGNOSIS — L121 Cicatricial pemphigoid: Secondary | ICD-10-CM | POA: Diagnosis not present

## 2023-09-27 DIAGNOSIS — D630 Anemia in neoplastic disease: Secondary | ICD-10-CM | POA: Diagnosis not present

## 2023-09-27 DIAGNOSIS — E1169 Type 2 diabetes mellitus with other specified complication: Secondary | ICD-10-CM | POA: Diagnosis not present

## 2023-09-27 DIAGNOSIS — E66813 Obesity, class 3: Secondary | ICD-10-CM | POA: Diagnosis not present

## 2023-09-27 DIAGNOSIS — Z556 Problems related to health literacy: Secondary | ICD-10-CM | POA: Diagnosis not present

## 2023-09-27 DIAGNOSIS — K7460 Unspecified cirrhosis of liver: Secondary | ICD-10-CM | POA: Diagnosis not present

## 2023-09-27 DIAGNOSIS — S72491D Other fracture of lower end of right femur, subsequent encounter for closed fracture with routine healing: Secondary | ICD-10-CM | POA: Diagnosis not present

## 2023-09-27 DIAGNOSIS — C50412 Malignant neoplasm of upper-outer quadrant of left female breast: Secondary | ICD-10-CM | POA: Diagnosis not present

## 2023-09-27 DIAGNOSIS — E785 Hyperlipidemia, unspecified: Secondary | ICD-10-CM | POA: Diagnosis not present

## 2023-09-27 DIAGNOSIS — M150 Primary generalized (osteo)arthritis: Secondary | ICD-10-CM | POA: Diagnosis not present

## 2023-09-27 DIAGNOSIS — Z6841 Body Mass Index (BMI) 40.0 and over, adult: Secondary | ICD-10-CM | POA: Diagnosis not present

## 2023-09-27 DIAGNOSIS — I10 Essential (primary) hypertension: Secondary | ICD-10-CM | POA: Diagnosis not present

## 2023-09-27 DIAGNOSIS — M9701XD Periprosthetic fracture around internal prosthetic right hip joint, subsequent encounter: Secondary | ICD-10-CM | POA: Diagnosis not present

## 2023-09-27 DIAGNOSIS — E1142 Type 2 diabetes mellitus with diabetic polyneuropathy: Secondary | ICD-10-CM | POA: Diagnosis not present

## 2023-09-27 NOTE — Telephone Encounter (Signed)
 LMOVM to call back

## 2023-09-28 ENCOUNTER — Encounter (HOSPITAL_COMMUNITY)

## 2023-09-28 ENCOUNTER — Telehealth: Payer: Self-pay

## 2023-09-28 ENCOUNTER — Other Ambulatory Visit: Payer: Self-pay | Admitting: Internal Medicine

## 2023-09-28 DIAGNOSIS — M9711XD Periprosthetic fracture around internal prosthetic right knee joint, subsequent encounter: Secondary | ICD-10-CM | POA: Diagnosis not present

## 2023-09-28 DIAGNOSIS — M9701XD Periprosthetic fracture around internal prosthetic right hip joint, subsequent encounter: Secondary | ICD-10-CM | POA: Diagnosis not present

## 2023-09-28 DIAGNOSIS — S72491D Other fracture of lower end of right femur, subsequent encounter for closed fracture with routine healing: Secondary | ICD-10-CM | POA: Diagnosis not present

## 2023-09-28 DIAGNOSIS — E1169 Type 2 diabetes mellitus with other specified complication: Secondary | ICD-10-CM | POA: Diagnosis not present

## 2023-09-28 DIAGNOSIS — F411 Generalized anxiety disorder: Secondary | ICD-10-CM

## 2023-09-28 DIAGNOSIS — E1142 Type 2 diabetes mellitus with diabetic polyneuropathy: Secondary | ICD-10-CM | POA: Diagnosis not present

## 2023-09-28 DIAGNOSIS — S82831D Other fracture of upper and lower end of right fibula, subsequent encounter for closed fracture with routine healing: Secondary | ICD-10-CM | POA: Diagnosis not present

## 2023-09-28 LAB — CBC WITH DIFFERENTIAL/PLATELET
Absolute Lymphocytes: 653 {cells}/uL — ABNORMAL LOW (ref 850–3900)
Absolute Monocytes: 232 {cells}/uL (ref 200–950)
Basophils Absolute: 41 {cells}/uL (ref 0–200)
Basophils Relative: 1.4 %
Eosinophils Absolute: 220 {cells}/uL (ref 15–500)
Eosinophils Relative: 7.6 %
HCT: 38.7 % (ref 35.0–45.0)
Hemoglobin: 11.7 g/dL (ref 11.7–15.5)
MCH: 29 pg (ref 27.0–33.0)
MCHC: 30.2 g/dL — ABNORMAL LOW (ref 32.0–36.0)
MCV: 95.8 fL (ref 80.0–100.0)
MPV: 12.4 fL (ref 7.5–12.5)
Monocytes Relative: 8 %
Neutro Abs: 1755 {cells}/uL (ref 1500–7800)
Neutrophils Relative %: 60.5 %
Platelets: 78 Thousand/uL — ABNORMAL LOW (ref 140–400)
RBC: 4.04 Million/uL (ref 3.80–5.10)
RDW: 15.7 % — ABNORMAL HIGH (ref 11.0–15.0)
Total Lymphocyte: 22.5 %
WBC: 2.9 Thousand/uL — ABNORMAL LOW (ref 3.8–10.8)

## 2023-09-28 LAB — COMPREHENSIVE METABOLIC PANEL WITH GFR
AG Ratio: 0.8 (calc) — ABNORMAL LOW (ref 1.0–2.5)
ALT: 13 U/L (ref 6–29)
AST: 26 U/L (ref 10–35)
Albumin: 3.1 g/dL — ABNORMAL LOW (ref 3.6–5.1)
Alkaline phosphatase (APISO): 168 U/L — ABNORMAL HIGH (ref 37–153)
BUN: 14 mg/dL (ref 7–25)
CO2: 28 mmol/L (ref 20–32)
Calcium: 9.2 mg/dL (ref 8.6–10.4)
Chloride: 106 mmol/L (ref 98–110)
Creat: 0.98 mg/dL (ref 0.60–1.00)
Globulin: 3.9 g/dL — ABNORMAL HIGH (ref 1.9–3.7)
Glucose, Bld: 191 mg/dL — ABNORMAL HIGH (ref 65–99)
Potassium: 4.1 mmol/L (ref 3.5–5.3)
Sodium: 140 mmol/L (ref 135–146)
Total Bilirubin: 0.6 mg/dL (ref 0.2–1.2)
Total Protein: 7 g/dL (ref 6.1–8.1)
eGFR: 61 mL/min/1.73m2 (ref >=60)

## 2023-09-28 LAB — CELIAC DISEASE PANEL
(tTG) Ab, IgA: <1 U/mL
(tTG) Ab, IgG: 1.6 U/mL
Gliadin IgA: <1 U/mL
Gliadin IgG: 1.5 U/mL
Immunoglobulin A: 526 mg/dL — ABNORMAL HIGH (ref 70–320)

## 2023-09-28 LAB — PROTIME-INR
INR: 1.2 — ABNORMAL HIGH
Prothrombin Time: 12.7 s — ABNORMAL HIGH (ref 9.0–11.5)

## 2023-09-28 LAB — HEMOGLOBIN A1C: Hemoglobin A1C: 6

## 2023-09-28 LAB — AFP TUMOR MARKER: AFP-Tumor Marker: 4.9 ng/mL

## 2023-09-28 LAB — HEPATITIS A ANTIBODY, TOTAL: Hepatitis A AB,Total: REACTIVE — AB

## 2023-09-28 LAB — HEPATITIS B SURFACE ANTIBODY,QUALITATIVE: Hep B S Ab: REACTIVE — AB

## 2023-09-28 MED ORDER — BUSPIRONE HCL 7.5 MG PO TABS
7.5000 mg | ORAL_TABLET | Freq: Two times a day (BID) | ORAL | 3 refills | Status: AC
Start: 2023-09-28 — End: ?

## 2023-09-28 NOTE — Progress Notes (Signed)
 Spoke to the patient. She has been feeling more anxious lately despite taking Wellbutrin  150 mg once daily. Added Buspar  7.5 mg BID, but advised that she can take it as only PRN as well. Patient expressed understanding.

## 2023-09-28 NOTE — Telephone Encounter (Signed)
 Copied from CRM #[id number]. Topic: Clinical - Medication Question >> Sep 28, 2023 11:49 AM Emylou G wrote: Reason for CRM: Patient called.. said medication for anxiety isn't working.. Is there medication alternative.. stronger possibly

## 2023-09-30 ENCOUNTER — Encounter (INDEPENDENT_AMBULATORY_CARE_PROVIDER_SITE_OTHER): Payer: Self-pay

## 2023-10-04 ENCOUNTER — Other Ambulatory Visit: Payer: Self-pay | Admitting: Hematology and Oncology

## 2023-10-04 DIAGNOSIS — R928 Other abnormal and inconclusive findings on diagnostic imaging of breast: Secondary | ICD-10-CM

## 2023-10-04 DIAGNOSIS — Z853 Personal history of malignant neoplasm of breast: Secondary | ICD-10-CM

## 2023-10-05 DIAGNOSIS — S82831D Other fracture of upper and lower end of right fibula, subsequent encounter for closed fracture with routine healing: Secondary | ICD-10-CM | POA: Diagnosis not present

## 2023-10-05 DIAGNOSIS — M9701XD Periprosthetic fracture around internal prosthetic right hip joint, subsequent encounter: Secondary | ICD-10-CM | POA: Diagnosis not present

## 2023-10-05 DIAGNOSIS — S72491D Other fracture of lower end of right femur, subsequent encounter for closed fracture with routine healing: Secondary | ICD-10-CM | POA: Diagnosis not present

## 2023-10-05 DIAGNOSIS — E1169 Type 2 diabetes mellitus with other specified complication: Secondary | ICD-10-CM | POA: Diagnosis not present

## 2023-10-05 DIAGNOSIS — M9711XD Periprosthetic fracture around internal prosthetic right knee joint, subsequent encounter: Secondary | ICD-10-CM | POA: Diagnosis not present

## 2023-10-05 DIAGNOSIS — E1142 Type 2 diabetes mellitus with diabetic polyneuropathy: Secondary | ICD-10-CM | POA: Diagnosis not present

## 2023-10-07 ENCOUNTER — Ambulatory Visit
Admission: RE | Admit: 2023-10-07 | Discharge: 2023-10-07 | Disposition: A | Discharge status: Home / Self Care | Source: Ambulatory Visit | Attending: Hematology and Oncology | Admitting: Hematology and Oncology

## 2023-10-07 ENCOUNTER — Other Ambulatory Visit: Payer: Self-pay

## 2023-10-07 ENCOUNTER — Encounter (HOSPITAL_COMMUNITY)
Admission: RE | Admit: 2023-10-07 | Discharge: 2023-10-07 | Disposition: A | Discharge status: Home / Self Care | Source: Ambulatory Visit | Attending: Internal Medicine | Admitting: Internal Medicine

## 2023-10-07 DIAGNOSIS — K3184 Gastroparesis: Secondary | ICD-10-CM | POA: Diagnosis not present

## 2023-10-07 DIAGNOSIS — K30 Functional dyspepsia: Secondary | ICD-10-CM | POA: Diagnosis not present

## 2023-10-07 DIAGNOSIS — R1033 Periumbilical pain: Secondary | ICD-10-CM | POA: Diagnosis not present

## 2023-10-07 DIAGNOSIS — R928 Other abnormal and inconclusive findings on diagnostic imaging of breast: Secondary | ICD-10-CM

## 2023-10-07 DIAGNOSIS — Z853 Personal history of malignant neoplasm of breast: Secondary | ICD-10-CM

## 2023-10-07 MED ORDER — TECHNETIUM TC 99M SULFUR COLLOID
2.2000 | Freq: Once | INTRAVENOUS | Status: AC | PRN
  Administered 2023-10-07: 2.2 via ORAL

## 2023-10-12 ENCOUNTER — Encounter: Payer: Self-pay | Admitting: Orthopaedic Surgery

## 2023-10-12 ENCOUNTER — Ambulatory Visit: Payer: Self-pay

## 2023-10-12 ENCOUNTER — Telehealth: Payer: Self-pay | Admitting: Internal Medicine

## 2023-10-12 ENCOUNTER — Other Ambulatory Visit (INDEPENDENT_AMBULATORY_CARE_PROVIDER_SITE_OTHER): Payer: Self-pay

## 2023-10-12 ENCOUNTER — Telehealth: Payer: Self-pay

## 2023-10-12 ENCOUNTER — Other Ambulatory Visit: Payer: Self-pay | Admitting: Internal Medicine

## 2023-10-12 ENCOUNTER — Ambulatory Visit: Admitting: Orthopaedic Surgery

## 2023-10-12 DIAGNOSIS — Z9889 Other specified postprocedural states: Secondary | ICD-10-CM

## 2023-10-12 DIAGNOSIS — Z8781 Personal history of (healed) traumatic fracture: Secondary | ICD-10-CM

## 2023-10-12 DIAGNOSIS — R42 Dizziness and giddiness: Secondary | ICD-10-CM

## 2023-10-12 DIAGNOSIS — G43109 Migraine with aura, not intractable, without status migrainosus: Secondary | ICD-10-CM

## 2023-10-12 MED ORDER — MECLIZINE HCL 25 MG PO TABS: 25.0000 mg | ORAL_TABLET | Freq: Three times a day (TID) | ORAL | 0 refills | Status: AC | PRN

## 2023-10-12 NOTE — Telephone Encounter (Signed)
 Spoke with patient and confirmed appointment on 9/11

## 2023-10-12 NOTE — Addendum Note (Signed)
 Addended byBETHA TOBIE DOWNS on: 10/12/2023 05:06 PM   Modules accepted: Orders

## 2023-10-12 NOTE — Telephone Encounter (Signed)
  FYI Only or Action Required?: Action required by provider: referral request and update on patient condition.  Patient was last seen in primary care on 08/26/2023 by Bevely Doffing, FNP.  Called Nurse Triage reporting Dizziness and Vomiting.  Symptoms began 3 days ago.  Interventions attempted: Rest, hydration, or home remedies.  Symptoms are: gradually worsening.  Triage Disposition: Go to ED Now (Notify PCP)  Patient/caregiver understands and will follow disposition?: Unsure          Copied from CRM [id number]. Topic: Clinical - Red Word Triage >> Oct 12, 2023  3:47 PM Tinnie BROCKS wrote: Red Word that prompted transfer to Nurse Triage: Dizzy, throwing up, falling down. Had vertigo before but it hadn't been this bad. Sent a request for neurology referral for pt to a place she has been to before. Reason for Disposition  SEVERE dizziness (vertigo) (e.g., unable to walk without assistance)  Answer Assessment - Initial Assessment Questions Vomiting x 3 days Vertigo Turning head too fast makes it worse  Patient states she has medicine for nausea and she was prescribed  Patient is advised that the recommendation at this time is for her to go to the Emergency Room Patient states that she is going to try and relax and if she doesn't feel better she is going to get her husband or daughter to take her to the Emergency Room Patient wants Dr Tobie to possibly get her back referred to Neurology/Physical Therapy like she had done in the past for her vertigo Patient asks that she be called on her cell phone [phone] and not sent a message through MyChart because she is having trouble with MyChart     1. DESCRIPTION: Describe your dizziness.     Patient states so dizzy she has been vomiting 2. VERTIGO: Do you feel like either you or the room is spinning or tilting?      yes 3. LIGHTHEADED: Do you feel lightheaded? (e.g., somewhat faint, woozy, weak upon standing)     yes 4.  SEVERITY: How bad is it?  Can you walk?      5. ONSET:  When did the dizziness begin?     3 days ago 6. AGGRAVATING FACTORS: Does anything make it worse? (e.g., standing, change in head position)     Turning quickly 7. CAUSE: What do you think is causing the dizziness?     History of vertigo per patient 8. RECURRENT SYMPTOM: Have you had dizziness before? If Yes, ask: When was the last time? What happened that time?     Yes--she states that she has and was sent to neurologist and physical therapist 9. OTHER SYMPTOMS: Do you have any other symptoms? (e.g., earache, headache, numbness, tinnitus, vomiting, weakness)     vomiting  Protocols used: Dizziness - Vertigo-A-AH

## 2023-10-12 NOTE — Telephone Encounter (Signed)
 Called [facility] to advise them of patient's symptoms and ER disposition & it's unclear if she is going to go to the ER at this time

## 2023-10-12 NOTE — Progress Notes (Signed)
 The patient is now about 5-1/70-month status post open reduction, internal fixation of a highly complex right periprosthetic distal femur fracture.  She is using a bone stimulator at this standpoint.  She has been putting weight on her leg as we have instructed.  She does report some pain but feels like she is making some progress.  She does have significant comorbidities and we are trying to avoid revision surgery which would likely be a distal femur replacement which is something I have never done.  It would be a significant undertaking and I would need to send her to a joint revision specialist.  She has been compliant using the bone stimulator and has been working with physical therapy and putting more weight on her right lower extremity which is helping somewhat bone turnover ability.  2 views of her right knee today when compared to previous views show that the hardware is still intact and the replacement is still intact and there has been some interval healing when compared to previous films.  She will continue to only walk with an assist device such as a walker and go slow.  She does not need to take long walks.  Will see her back in 6 weeks with a repeat 2 views of her right knee.

## 2023-10-12 NOTE — Telephone Encounter (Signed)
 Copied from CRM #[id number]. Topic: Referral - Request for Referral >> Oct 12, 2023  3:44 PM Lauren C wrote: Did the patient discuss referral with their provider in the last year? Yes (If No - schedule appointment) (If Yes - send message)  Appointment offered? No  Type of order/referral and detailed reason for visit: Neurology for vertigo  Preference of office, provider, location: [facility] neurology, corner of [address] She has been previously.  If referral order, have you been seen by this specialty before? Yes (If Yes, this issue or another issue? When? Where?  Can we respond through MyChart? Yes

## 2023-10-13 ENCOUNTER — Inpatient Hospital Stay: Attending: Hematology and Oncology | Admitting: Hematology and Oncology

## 2023-10-13 VITALS — BP 153/71 | HR 72 | Temp 97.6°F | Resp 16

## 2023-10-13 DIAGNOSIS — C50412 Malignant neoplasm of upper-outer quadrant of left female breast: Secondary | ICD-10-CM | POA: Diagnosis not present

## 2023-10-13 DIAGNOSIS — D72819 Decreased white blood cell count, unspecified: Secondary | ICD-10-CM | POA: Diagnosis not present

## 2023-10-13 DIAGNOSIS — Z1721 Progesterone receptor positive status: Secondary | ICD-10-CM | POA: Diagnosis not present

## 2023-10-13 DIAGNOSIS — Z923 Personal history of irradiation: Secondary | ICD-10-CM | POA: Insufficient documentation

## 2023-10-13 DIAGNOSIS — Z808 Family history of malignant neoplasm of other organs or systems: Secondary | ICD-10-CM | POA: Diagnosis not present

## 2023-10-13 DIAGNOSIS — D696 Thrombocytopenia, unspecified: Secondary | ICD-10-CM | POA: Diagnosis not present

## 2023-10-13 DIAGNOSIS — C50411 Malignant neoplasm of upper-outer quadrant of right female breast: Secondary | ICD-10-CM | POA: Insufficient documentation

## 2023-10-13 DIAGNOSIS — R42 Dizziness and giddiness: Secondary | ICD-10-CM | POA: Diagnosis not present

## 2023-10-13 DIAGNOSIS — Z79811 Long term (current) use of aromatase inhibitors: Secondary | ICD-10-CM | POA: Diagnosis not present

## 2023-10-13 DIAGNOSIS — Z170 Estrogen receptor positive status [ER+]: Secondary | ICD-10-CM | POA: Diagnosis not present

## 2023-10-13 DIAGNOSIS — Z8049 Family history of malignant neoplasm of other genital organs: Secondary | ICD-10-CM | POA: Diagnosis not present

## 2023-10-13 DIAGNOSIS — Z79899 Other long term (current) drug therapy: Secondary | ICD-10-CM | POA: Diagnosis not present

## 2023-10-13 DIAGNOSIS — L121 Cicatricial pemphigoid: Secondary | ICD-10-CM | POA: Diagnosis not present

## 2023-10-13 DIAGNOSIS — G629 Polyneuropathy, unspecified: Secondary | ICD-10-CM | POA: Insufficient documentation

## 2023-10-13 NOTE — Progress Notes (Unsigned)
 [facility] Cancer Follow up:    [name] [name], MD [address] [place] [address]   DIAGNOSIS:  Cancer Staging  Malignant neoplasm of upper-outer quadrant of left breast in female, estrogen receptor positive (HCC) Staging form: Breast, AJCC 7th Edition - Clinical: Stage IA (T1c, N0, M0) - Signed by [name] Sandria BROCKS, MD on 08/19/2015 - Pathologic: Stage IA (T1c, N0, cM0) - Signed by [name] Morna Pickle, NP on 10/26/2021   SUMMARY OF ONCOLOGIC HISTORY: Oncology History  Malignant neoplasm of upper-outer quadrant of left breast in female, estrogen receptor positive (HCC)  02/02/2015 - 02/2021 Anti-estrogen oral therapy   Anastrozole  daily, completed 6 years   08/04/2015 Surgery   73 y.o. [name] [name] status post left breast upper outer quadrant lumpectomy 08/04/2015 for a pT1c pNX, stage I A invasive ductal carcinoma, estrogen and progesterone receptor positive, HER-2 not amplified, with no HER-2 amplification, anterior margin was focally positive   08/04/2015 Oncotype testing   Oncotype DX score of 12 predicts a 10 year risk of recurrence outside the breast of 8% if the patient's only systemic therapy is tamoxifen for 5 years. It also predicts no benefit from therapy.      09/12/2015 Surgery   additional Left breast surgery 09/12/2015 cleared the margins and found all 5 axillary lymph nodes sampled clear for a final stage pT1c pN0, stage IA   10/22/2015 - 12/08/2015 Radiation Therapy   Adjuvant radiation 10/22/15 - 12/08/15              1) Left breast: 50.4 Gy in 28 fractions              2) Left breast boost: 10 Gy in 5 fractions   10/26/2021 Cancer Staging   Staging form: Breast, AJCC 7th Edition - Pathologic: Stage IA (T1c, N0, cM0) - Signed by [name] Morna Pickle, NP on 10/26/2021   Malignant neoplasm of right breast (HCC)  06/10/2021 Mammogram   asymmetry within the OUTER RIGHT breast on CC views warrants further evaluation.   Targeted  ultrasound : showing hypoechoic oval mass with indistinct margins in the 10:30 o'clock location of the RIGHT breast 9 centimeters from nipple measuring 0.5 x 0.4 x 0.4 centimeters. This area is approximately 1.8 centimeters from the palpable bruise, bruise was thought to be fat necrosis.   06/23/2021 Initial Diagnosis   Right breast needle core biopsy IDC, grade one, ER 90% positive, PR 60% positive, Ki67 2%, HER-2 IHC 2+, FISH negative   07/08/2021 Treatment Plan Change   Faslodex  every 4 weeks, Abemaciclib to be added    08/12/2021 Surgery   Right lumpectomy: invasive ductal carcinoma with clear margins of resection, final pathology showed tumor measuring 1.1 cm x 0.7 x 8.7,  grade 3   previous biopsy showed ER 90% positive, PR 60% positive, Ki-67 was 2% HER2 2+ by IHC and negative by FISH   08/12/2021 Oncotype testing   12   09/22/2021 - 10/30/2021 Radiation Therapy   Site Technique Total Dose (Gy) Dose per Fx (Gy) Completed Fx Beam Energies  Breast, Right: Breast_R 3D 45/45 1.8 25/25 15X, 10XFFF  Breast, Right: Breast_R_Bst 3D 6/6 2 3/3 10X, 15X       CURRENT THERAPY: Exemestane   INTERVAL HISTORY: [name] [name] 73 y.o. female returns for f/u of her history of breast cancer while on exemestane .  History of Present Illness  Discussed the use of AI scribe software for clinical note transcription with the patient, who gave verbal  consent to proceed.  History of Present Illness      Patient Active Problem List   Diagnosis Date Noted  . Periumbilical abdominal pain 09/26/2023  . Microcytic anemia 07/28/2023  . Closed left femoral fracture (HCC) 05/01/2023  . Right femoral fracture (HCC) 05/01/2023  . Hypothyroidism 05/01/2023  . Physical deconditioning 03/30/2023  . Fall 11/25/2022  . Esophageal varices (HCC) 10/19/2022  . COVID-19 virus infection 09/17/2022  . Immunosuppressed status (HCC) 09/17/2022  . Abnormal CT of the abdomen 08/09/2022  . Paraumbilical hernia  07/29/2022  . Esophageal varices in cirrhosis (HCC) 07/29/2022  . Cirrhosis of liver without ascites (HCC) 07/12/2022  . LUQ abdominal pain 07/05/2022  . Drug-induced constipation 07/05/2022  . GAD (generalized anxiety disorder) 08/28/2021  . Disorder of intervertebral disc of cervical spine 08/28/2021  . Family history of malignant neoplasm of digestive organs 08/28/2021  . Malignant neoplasm of right breast (HCC) 07/08/2021  . 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 Pseudophakia of both eyes 04/01/2021  . Retinal edema 04/01/2021  . Trichiasis of eyelid of both eyes 04/01/2021  . Hypertrophy of inferior nasal turbinate 02/26/2021  . Allergic rhinitis due to pollen 01/29/2021  . Allergy to influenza vaccine 01/29/2021  . Gout 01/29/2021  . Hyperlipidemia 01/29/2021  . Nonalcoholic steatohepatitis (NASH) 01/29/2021  . Primary insomnia 01/29/2021  . Recurrent major depression in remission (HCC) 01/29/2021  . Arthritis of right ankle 04/14/2020  . Nasal septal deviation 03/11/2020  . Arthritis of right subtalar joint 10/09/2019  . Acquired pes planovalgus of right foot 10/09/2019  . Acquired posterior equinus of right lower extremity 10/09/2019  . Epistaxis, recurrent 09/26/2019  . OSA on CPAP 09/26/2019  . Acquired hypothyroidism 05/21/2019  . Arthritis of shoulder 09/07/2018  . Bilateral hearing loss 08/01/2018  . Vertigo 08/01/2018  . Chronic nonintractable headache 08/01/2018  . Type 2 diabetes mellitus with hyperlipidemia (HCC) 11/16/2016  . Bilateral lower extremity edema 09/01/2016  . Diabetic polyneuropathy associated with type 2 diabetes mellitus (HCC) 06/03/2016  . Malignant neoplasm of upper-outer quadrant of left breast in female, estrogen receptor positive (HCC) 08/19/2015  . Osteoarthritis of right knee 02/28/2015  . Status post total right knee replacement 02/28/2015  . Splenomegaly 07/30/2014  . Thrombocytopenia (HCC) 07/30/2014  . Recurrent falls 01/12/2013   . Class 3 obesity 01/12/2013  . Degenerative arthritis of spine 01/12/2013  . Essential hypertension 09/05/2012    is allergic to codeine , influenza a (h1n1) monoval pf, hydrocodone -acetaminophen , lisinopril , metformin, pregabalin, and jardiance [empagliflozin].  MEDICAL HISTORY: Past Medical History:  Diagnosis Date  . Abdominal wall contusion 12/09/2011  . Achilles tendon contracture, bilateral 06/03/2016  . Anxiety   . Arthritis   . Blood dyscrasia    low platelet count- sees [name] , Dr. Eleanor Lily ( Note in EPIC from 07/2014) at [facility]  . Breast cancer [facility]) 2017   Left Breast  . Breast cancer (HCC) 07/2021   right breast IDC  . Cancer (HCC)    left breast  . Chest wall contusion 12/09/2011  . Cirrhosis of liver (HCC)   . Cough   . Diabetes mellitus without complication (HCC)   . Diabetic neuropathy (HCC)   . Diarrhea due to drug 01/29/2021  . Dyspnea   . Gout   . Gout   . History of radiation therapy 10/22/15 - 12/08/15   left breast 50.4 Gy, boost to 10 Gy  . History of radiation therapy    Right breast- 09/22/21-10/30/21- Dr. Lynwood Nasuti  . Hypertension   .  Hypothyroidism   . Migraine   . 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 Neuromuscular disorder (HCC)    diabetic neuropathy  . Neuropathy   . Personal history of radiation therapy 2017   Left Breast Cancer  . Posterior tibial tendon dysfunction (PTTD) of right lower extremity 10/09/2019  . Sleep apnea    CPAP nightly  . Spleen enlarged   . Thyroid  disease     SURGICAL HISTORY: Past Surgical History:  Procedure Laterality Date  . ANKLE FUSION Right 2022  . BIOPSY  10/19/2022   Procedure: BIOPSY;  Surgeon: Eartha Angelia Sieving, MD;  Location: [facility];  Service: Gastroenterology;;  . BREAST BIOPSY Left 08/04/2015   Procedure: LEFT BREAST BIOPSY WITH NEEDLE LOCALIZATION;  Surgeon: Krystal Spinner, MD;  Location: [facility];  Service: General;  Laterality:  Left;  . BREAST BIOPSY Right 06/23/2021  . BREAST DUCTAL SYSTEM EXCISION Left 08/04/2015   Procedure: LEFT EXCISION DUCTAL SYSTEM BREAST;  Surgeon: Krystal Spinner, MD;  Location: [facility];  Service: General;  Laterality: Left;  . BREAST LUMPECTOMY Left 08/2015  . BREAST LUMPECTOMY Right 08/2021  . BREAST LUMPECTOMY WITH AXILLARY LYMPH NODE BIOPSY Left 09/12/2015   Procedure: RE-EXCISION OF LEFT BREAST LUMPECTOMY WITH LEFT AXILLARY LYMPH NODE BIOPSY;  Surgeon: Krystal Spinner, MD;  Location: [facility];  Service: General;  Laterality: Left;  . BREAST LUMPECTOMY WITH RADIOACTIVE SEED LOCALIZATION Right 08/12/2021   Procedure: RIGHT BREAST LUMPECTOMY WITH RADIOACTIVE SEED LOCALIZATION;  Surgeon: Vernetta Berg, MD;  Location: [facility];  Service: General;  Laterality: Right;  LMA  . CHOLECYSTECTOMY    . ESOPHAGOGASTRODUODENOSCOPY (EGD) WITH PROPOFOL  N/A 10/19/2022   Procedure: ESOPHAGOGASTRODUODENOSCOPY (EGD) WITH PROPOFOL ;  Surgeon: Eartha Angelia Sieving, MD;  Location: [facility];  Service: Gastroenterology;  Laterality: N/A;  10:30am asa 3  . EYE SURGERY Bilateral 2022   Lashes growing backwards into the eye  . JOINT REPLACEMENT     left knee  . JOINT REPLACEMENT  2017   right knee  . ORIF FEMUR FRACTURE Right 05/02/2023   Procedure: OPEN REDUCTION INTERNAL FIXATION (ORIF) DISTAL FEMUR FRACTURE;  Surgeon: Genelle Standing, MD;  Location: [facility];  Service: Orthopedics;  Laterality: Right;  . REVERSE SHOULDER ARTHROPLASTY Right 09/07/2018   Procedure: RIGHT REVERSE SHOULDER ARTHROPLASTY;  Surgeon: Addie Cordella Hamilton, MD;  Location: [facility];  Service: Orthopedics;  Laterality: Right;  . TOTAL KNEE ARTHROPLASTY Right 02/28/2015   Procedure: RIGHT TOTAL KNEE ARTHROPLASTY;  Surgeon: Lonni CINDERELLA Vernetta, MD;  Location: [facility];  Service: Orthopedics;  Laterality: Right;  . TUBAL LIGATION      SOCIAL HISTORY: Social History   Socioeconomic  History  . Marital status: Married    Spouse name: Dallas Trego  . Number of children: 3  . Years of education: 12th  . Highest education level: 12th grade  Occupational History  . Occupation: Retired    Comment: [employer]  Tobacco Use  . Smoking status: Never    Passive exposure: Never  . Smokeless tobacco: Never  Vaping Use  . Vaping status: Never Used  Substance and Sexual Activity  . Alcohol use: No  . Drug use: No  . Sexual activity: Yes    Partners: Male    Birth control/protection: Surgical    Comment: BTL  Other Topics Concern  . Not on file  Social History Narrative  . Not on file   Social Drivers of Health   Financial Resource Strain:  Low Risk  (03/16/2023)   Overall Financial Resource Strain (CARDIA)   . Difficulty of Paying Living Expenses: Not hard at all  Food Insecurity: No Food Insecurity (05/01/2023)   Hunger Vital Sign   . Worried About Programme researcher, broadcasting/film/video in the Last Year: Never true   . Ran Out of Food in the Last Year: Never true  Transportation Needs: No Transportation Needs (05/01/2023)   PRAPARE - Transportation   . Lack of Transportation (Medical): No   . Lack of Transportation (Non-Medical): No  Physical Activity: Sufficiently Active (03/16/2023)   Exercise Vital Sign   . Days of Exercise per Week: 5 days   . Minutes of Exercise per Session: 50 min  Stress: No Stress Concern Present (03/16/2023)   [employer] of Occupational Health - Occupational Stress Questionnaire   . Feeling of Stress : Only a little  Social Connections: Moderately Integrated (05/01/2023)   Social Connection and Isolation Panel   . Frequency of Communication with Friends and Family: More than three times a week   . Frequency of Social Gatherings with Friends and Family: More than three times a week   . Attends Religious Services: Never   . Active Member of Clubs or Organizations: Yes   . Attends Banker Meetings: More than 4 times per year   .  Marital Status: Married  Catering manager Violence: Not At Risk (05/01/2023)   Humiliation, Afraid, Rape, and Kick questionnaire   . Fear of Current or Ex-Partner: No   . Emotionally Abused: No   . Physically Abused: No   . Sexually Abused: No    FAMILY HISTORY: Family History  Problem Relation Age of Onset  . Hypothyroidism Mother   . Alzheimer's disease Mother   . Diabetes Father   . Cancer Sister        GYN cancer  . Hypothyroidism Sister   . Heart disease Brother   . Arthritis Son   . Diabetes Son   . Arthritis Son   . Cancer Maternal Grandmother   . Heart failure Maternal Grandfather   . Bone cancer Paternal Grandmother   . Heart failure Paternal Grandmother     Review of Systems  Constitutional:  Negative for appetite change, chills, fatigue, fever and unexpected weight change.  HENT:   Negative for hearing loss, lump/mass and trouble swallowing.   Eyes:  Negative for eye problems and icterus.  Respiratory:  Negative for chest tightness, cough and shortness of breath.   Cardiovascular:  Negative for chest pain, leg swelling and palpitations.  Gastrointestinal:  Negative for abdominal distention, abdominal pain, constipation, diarrhea, nausea and vomiting.  Endocrine: Negative for hot flashes.  Genitourinary:  Negative for difficulty urinating.   Musculoskeletal:  Negative for arthralgias.  Skin:  Negative for itching and rash.  Neurological:  Negative for dizziness, extremity weakness, headaches and numbness.  Hematological:  Negative for adenopathy. Does not bruise/bleed easily.  Psychiatric/Behavioral:  Negative for depression. The patient is not nervous/anxious.       PHYSICAL EXAMINATION     Vitals:   10/13/23 0931  BP: (!) 153/71  Pulse: 72  Resp: 16  Temp: 97.6 F (36.4 C)  SpO2: 100%     Physical Exam Constitutional:      General: She is not in acute distress.    Appearance: Normal appearance. She is not toxic-appearing.  HENT:     Head:  Normocephalic and atraumatic.     Mouth/Throat:     Mouth: Mucous membranes  are moist.     Pharynx: Oropharynx is clear. No oropharyngeal exudate or posterior oropharyngeal erythema.  Eyes:     General: No scleral icterus. Cardiovascular:     Rate and Rhythm: Normal rate and regular rhythm.     Pulses: Normal pulses.     Heart sounds: Normal heart sounds.  Pulmonary:     Effort: Pulmonary effort is normal.     Breath sounds: Normal breath sounds.  Chest:     Comments: Bilateral breasts inspected and palpated. Right breast seroma near lumpectomy scar. Left breast post surgical changes No palpable adenopathy Abdominal:     General: Abdomen is flat. Bowel sounds are normal. There is no distension.     Palpations: Abdomen is soft.     Tenderness: There is no abdominal tenderness.  Musculoskeletal:        General: No swelling.     Cervical back: Neck supple.  Lymphadenopathy:     Cervical: No cervical adenopathy.  Skin:    General: Skin is warm and dry.     Findings: No rash.  Neurological:     General: No focal deficit present.     Mental Status: She is alert.  Psychiatric:        Mood and Affect: Mood normal.        Behavior: Behavior normal.      ASSESSMENT and THERAPY PLAN:   No problem-specific Assessment & Plan notes found for this encounter.      All questions were answered. The patient knows to call the clinic with any problems, questions or concerns. We can certainly see the patient much sooner if necessary.  Total encounter time:30 minutes*in face-to-face visit time, chart review, lab review, care coordination, order entry, and documentation of the encounter time.   *Total Encounter Time as defined by the Centers for Medicare and Medicaid Services includes, in addition to the face-to-face time of a patient visit (documented in the note above) non-face-to-face time: obtaining and reviewing outside history, ordering and reviewing medications, tests or  procedures, care coordination (communications with other health care professionals or caregivers) and documentation in the medical record.

## 2023-10-13 NOTE — Telephone Encounter (Signed)
Pt informed

## 2023-10-18 ENCOUNTER — Other Ambulatory Visit: Payer: Self-pay

## 2023-10-18 ENCOUNTER — Telehealth: Payer: Self-pay | Admitting: Orthopaedic Surgery

## 2023-10-18 DIAGNOSIS — E1142 Type 2 diabetes mellitus with diabetic polyneuropathy: Secondary | ICD-10-CM | POA: Diagnosis not present

## 2023-10-18 DIAGNOSIS — E1169 Type 2 diabetes mellitus with other specified complication: Secondary | ICD-10-CM | POA: Diagnosis not present

## 2023-10-18 DIAGNOSIS — S72491D Other fracture of lower end of right femur, subsequent encounter for closed fracture with routine healing: Secondary | ICD-10-CM | POA: Diagnosis not present

## 2023-10-18 DIAGNOSIS — S82831D Other fracture of upper and lower end of right fibula, subsequent encounter for closed fracture with routine healing: Secondary | ICD-10-CM | POA: Diagnosis not present

## 2023-10-18 DIAGNOSIS — M9701XD Periprosthetic fracture around internal prosthetic right hip joint, subsequent encounter: Secondary | ICD-10-CM | POA: Diagnosis not present

## 2023-10-18 DIAGNOSIS — M9711XD Periprosthetic fracture around internal prosthetic right knee joint, subsequent encounter: Secondary | ICD-10-CM | POA: Diagnosis not present

## 2023-10-18 NOTE — Telephone Encounter (Signed)
 Aware of message from [facility]

## 2023-10-18 NOTE — Telephone Encounter (Signed)
 Carrie from [facility] wants to know if she has 50% weight barring or all. [phone]

## 2023-10-19 ENCOUNTER — Ambulatory Visit (INDEPENDENT_AMBULATORY_CARE_PROVIDER_SITE_OTHER): Admitting: Neurology

## 2023-10-19 ENCOUNTER — Encounter: Payer: Self-pay | Admitting: Neurology

## 2023-10-19 VITALS — BP 149/65 | HR 74 | Ht 61.0 in | Wt 240.0 lb

## 2023-10-19 DIAGNOSIS — R42 Dizziness and giddiness: Secondary | ICD-10-CM

## 2023-10-19 DIAGNOSIS — R296 Repeated falls: Secondary | ICD-10-CM

## 2023-10-19 DIAGNOSIS — G4733 Obstructive sleep apnea (adult) (pediatric): Secondary | ICD-10-CM

## 2023-10-19 NOTE — Progress Notes (Signed)
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 things further with a brain MRI. We will call you with the test results.   Please make an appointment with your pulmonologist to talk about your sleep apnea as you currently are not on treatment with CPAP therapy. Talk to your primary care about a referral to physical therapy. Please talk to your primary care about a referral to ENT. Increase your water  intake to about 4 bottles of water  per day, 16.9 ounce size each. Keep in mind that certain medications can cause dizziness and balance problems and increase your fall risk.  These medications in your case include gabapentin , Myrbetriq , tramadol , trazodone , and Robaxin  (which you currently do not use). So long as your brain MRI is nonrevealing, we can follow-up in this clinic as needed. >>   This was an extended visit of over 60 minutes with copious record review involved, and considerable counseling and coordination of care. Thank you very much for allowing me to participate in the care of this nice patient. If I can be of any further assistance to you please do not hesitate to  call me at [phone].  Sincerely,   True Mar, MD, PhD

## 2023-10-19 NOTE — Patient Instructions (Addendum)
 Unfortunately, vertigo and dizziness are common complaints but there Devivo not be a primary neurological reason or single underlying medical problem. Often, there a combination of factors, that result in dizziness. This includes blood pressure fluctuations, medication side effects, blood sugar fluctuations, stress, vertigo, poor sleep with sleep deprivation, dehydration, and electrolyte disturbance or other metabolic and endocrinological reasons, meaning hormone related problems such as thyroid  dysfunction.  We will investigate things further with a brain MRI. We will call you with the test results.   Please make an appointment with your pulmonologist to talk about your sleep apnea as you currently are not on treatment with CPAP therapy. Talk to your primary care about a referral to physical therapy. Please talk to your primary care about a referral to ENT. Increase your water  intake to about 4 bottles of water  per day, 16.9 ounce size each. Keep in mind that certain medications can cause dizziness and balance problems and increase your fall risk.  These medications in your case include gabapentin , Myrbetriq , tramadol , trazodone , and Robaxin  (which you currently do not use). So long as your brain MRI is nonrevealing, we can follow-up in this clinic as needed.

## 2023-10-20 ENCOUNTER — Telehealth: Payer: Self-pay | Admitting: Neurology

## 2023-10-20 NOTE — Telephone Encounter (Signed)
 no auth required sent to GI [phone]

## 2023-10-25 DIAGNOSIS — S82831D Other fracture of upper and lower end of right fibula, subsequent encounter for closed fracture with routine healing: Secondary | ICD-10-CM | POA: Diagnosis not present

## 2023-10-25 DIAGNOSIS — S72491D Other fracture of lower end of right femur, subsequent encounter for closed fracture with routine healing: Secondary | ICD-10-CM | POA: Diagnosis not present

## 2023-10-25 DIAGNOSIS — E1169 Type 2 diabetes mellitus with other specified complication: Secondary | ICD-10-CM | POA: Diagnosis not present

## 2023-10-25 DIAGNOSIS — M9711XD Periprosthetic fracture around internal prosthetic right knee joint, subsequent encounter: Secondary | ICD-10-CM | POA: Diagnosis not present

## 2023-10-25 DIAGNOSIS — E1142 Type 2 diabetes mellitus with diabetic polyneuropathy: Secondary | ICD-10-CM | POA: Diagnosis not present

## 2023-10-25 DIAGNOSIS — M9701XD Periprosthetic fracture around internal prosthetic right hip joint, subsequent encounter: Secondary | ICD-10-CM | POA: Diagnosis not present

## 2023-10-27 DIAGNOSIS — M109 Gout, unspecified: Secondary | ICD-10-CM | POA: Diagnosis not present

## 2023-10-27 DIAGNOSIS — E1169 Type 2 diabetes mellitus with other specified complication: Secondary | ICD-10-CM | POA: Diagnosis not present

## 2023-10-27 DIAGNOSIS — E785 Hyperlipidemia, unspecified: Secondary | ICD-10-CM | POA: Diagnosis not present

## 2023-10-27 DIAGNOSIS — S82831D Other fracture of upper and lower end of right fibula, subsequent encounter for closed fracture with routine healing: Secondary | ICD-10-CM | POA: Diagnosis not present

## 2023-10-27 DIAGNOSIS — M150 Primary generalized (osteo)arthritis: Secondary | ICD-10-CM | POA: Diagnosis not present

## 2023-10-27 DIAGNOSIS — E039 Hypothyroidism, unspecified: Secondary | ICD-10-CM | POA: Diagnosis not present

## 2023-10-27 DIAGNOSIS — L121 Cicatricial pemphigoid: Secondary | ICD-10-CM | POA: Diagnosis not present

## 2023-10-27 DIAGNOSIS — S72491D Other fracture of lower end of right femur, subsequent encounter for closed fracture with routine healing: Secondary | ICD-10-CM | POA: Diagnosis not present

## 2023-10-27 DIAGNOSIS — E66813 Obesity, class 3: Secondary | ICD-10-CM | POA: Diagnosis not present

## 2023-10-27 DIAGNOSIS — Z794 Long term (current) use of insulin: Secondary | ICD-10-CM | POA: Diagnosis not present

## 2023-10-27 DIAGNOSIS — M9711XD Periprosthetic fracture around internal prosthetic right knee joint, subsequent encounter: Secondary | ICD-10-CM | POA: Diagnosis not present

## 2023-10-27 DIAGNOSIS — E1142 Type 2 diabetes mellitus with diabetic polyneuropathy: Secondary | ICD-10-CM | POA: Diagnosis not present

## 2023-10-27 DIAGNOSIS — K7460 Unspecified cirrhosis of liver: Secondary | ICD-10-CM | POA: Diagnosis not present

## 2023-10-27 DIAGNOSIS — C50412 Malignant neoplasm of upper-outer quadrant of left female breast: Secondary | ICD-10-CM | POA: Diagnosis not present

## 2023-10-27 DIAGNOSIS — D630 Anemia in neoplastic disease: Secondary | ICD-10-CM | POA: Diagnosis not present

## 2023-10-27 DIAGNOSIS — R296 Repeated falls: Secondary | ICD-10-CM | POA: Diagnosis not present

## 2023-10-27 DIAGNOSIS — Z993 Dependence on wheelchair: Secondary | ICD-10-CM | POA: Diagnosis not present

## 2023-10-27 DIAGNOSIS — D696 Thrombocytopenia, unspecified: Secondary | ICD-10-CM | POA: Diagnosis not present

## 2023-10-27 DIAGNOSIS — F3340 Major depressive disorder, recurrent, in remission, unspecified: Secondary | ICD-10-CM | POA: Diagnosis not present

## 2023-10-27 DIAGNOSIS — Z6841 Body Mass Index (BMI) 40.0 and over, adult: Secondary | ICD-10-CM | POA: Diagnosis not present

## 2023-10-27 DIAGNOSIS — F419 Anxiety disorder, unspecified: Secondary | ICD-10-CM | POA: Diagnosis not present

## 2023-10-27 DIAGNOSIS — M9701XD Periprosthetic fracture around internal prosthetic right hip joint, subsequent encounter: Secondary | ICD-10-CM | POA: Diagnosis not present

## 2023-10-27 DIAGNOSIS — Z556 Problems related to health literacy: Secondary | ICD-10-CM | POA: Diagnosis not present

## 2023-10-27 DIAGNOSIS — I10 Essential (primary) hypertension: Secondary | ICD-10-CM | POA: Diagnosis not present

## 2023-10-27 DIAGNOSIS — G4733 Obstructive sleep apnea (adult) (pediatric): Secondary | ICD-10-CM | POA: Diagnosis not present

## 2023-11-01 ENCOUNTER — Other Ambulatory Visit

## 2023-11-03 DIAGNOSIS — E1142 Type 2 diabetes mellitus with diabetic polyneuropathy: Secondary | ICD-10-CM | POA: Diagnosis not present

## 2023-11-03 DIAGNOSIS — M9701XD Periprosthetic fracture around internal prosthetic right hip joint, subsequent encounter: Secondary | ICD-10-CM | POA: Diagnosis not present

## 2023-11-03 DIAGNOSIS — M9711XD Periprosthetic fracture around internal prosthetic right knee joint, subsequent encounter: Secondary | ICD-10-CM | POA: Diagnosis not present

## 2023-11-03 DIAGNOSIS — S72491D Other fracture of lower end of right femur, subsequent encounter for closed fracture with routine healing: Secondary | ICD-10-CM | POA: Diagnosis not present

## 2023-11-03 DIAGNOSIS — S82831D Other fracture of upper and lower end of right fibula, subsequent encounter for closed fracture with routine healing: Secondary | ICD-10-CM | POA: Diagnosis not present

## 2023-11-03 DIAGNOSIS — E1169 Type 2 diabetes mellitus with other specified complication: Secondary | ICD-10-CM | POA: Diagnosis not present

## 2023-11-10 DIAGNOSIS — S82831D Other fracture of upper and lower end of right fibula, subsequent encounter for closed fracture with routine healing: Secondary | ICD-10-CM | POA: Diagnosis not present

## 2023-11-10 DIAGNOSIS — S72491D Other fracture of lower end of right femur, subsequent encounter for closed fracture with routine healing: Secondary | ICD-10-CM | POA: Diagnosis not present

## 2023-11-10 DIAGNOSIS — M9701XD Periprosthetic fracture around internal prosthetic right hip joint, subsequent encounter: Secondary | ICD-10-CM | POA: Diagnosis not present

## 2023-11-10 DIAGNOSIS — E1142 Type 2 diabetes mellitus with diabetic polyneuropathy: Secondary | ICD-10-CM | POA: Diagnosis not present

## 2023-11-10 DIAGNOSIS — E1169 Type 2 diabetes mellitus with other specified complication: Secondary | ICD-10-CM | POA: Diagnosis not present

## 2023-11-10 DIAGNOSIS — M9711XD Periprosthetic fracture around internal prosthetic right knee joint, subsequent encounter: Secondary | ICD-10-CM | POA: Diagnosis not present

## 2023-11-11 ENCOUNTER — Ambulatory Visit
Admission: RE | Admit: 2023-11-11 | Discharge: 2023-11-11 | Disposition: A | Discharge status: Home / Self Care | Source: Ambulatory Visit | Attending: Neurology | Admitting: Neurology

## 2023-11-11 DIAGNOSIS — G4733 Obstructive sleep apnea (adult) (pediatric): Secondary | ICD-10-CM

## 2023-11-11 DIAGNOSIS — R296 Repeated falls: Secondary | ICD-10-CM

## 2023-11-11 DIAGNOSIS — R42 Dizziness and giddiness: Secondary | ICD-10-CM | POA: Diagnosis not present

## 2023-11-11 MED ORDER — GADOPICLENOL 0.5 MMOL/ML IV SOLN
10.0000 mL | Freq: Once | INTRAVENOUS | Status: AC | PRN
  Administered 2023-11-11: 10 mL via INTRAVENOUS

## 2023-11-14 ENCOUNTER — Ambulatory Visit: Payer: Self-pay | Admitting: Neurology

## 2023-11-16 ENCOUNTER — Telehealth: Payer: Self-pay

## 2023-11-16 ENCOUNTER — Encounter (INDEPENDENT_AMBULATORY_CARE_PROVIDER_SITE_OTHER): Payer: Self-pay | Admitting: Gastroenterology

## 2023-11-16 ENCOUNTER — Encounter: Payer: Self-pay | Admitting: Internal Medicine

## 2023-11-16 NOTE — Telephone Encounter (Signed)
 Copied from CRM #[id number]. Topic: Clinical - Medication Question >> Nov 16, 2023 11:34 AM Treva T wrote: Reason for CRM: Patient calling, states she had a CT scan, and results indicated that she had a sinus infection, and was advised to contact PCP to request medication for sinus infection.  Patient is requesting medication, antibiotics for sinus infection.  Patient would like a return call to discuss further, can be reached at [phone].  Patient is aware of same day call back.     [id number] [place], [place] - [address] [place] [address] Phone: [phone] Fax: [phone]

## 2023-11-17 DIAGNOSIS — E1169 Type 2 diabetes mellitus with other specified complication: Secondary | ICD-10-CM | POA: Diagnosis not present

## 2023-11-17 DIAGNOSIS — S72491D Other fracture of lower end of right femur, subsequent encounter for closed fracture with routine healing: Secondary | ICD-10-CM | POA: Diagnosis not present

## 2023-11-17 DIAGNOSIS — M9701XD Periprosthetic fracture around internal prosthetic right hip joint, subsequent encounter: Secondary | ICD-10-CM | POA: Diagnosis not present

## 2023-11-17 DIAGNOSIS — M9711XD Periprosthetic fracture around internal prosthetic right knee joint, subsequent encounter: Secondary | ICD-10-CM | POA: Diagnosis not present

## 2023-11-17 DIAGNOSIS — E1142 Type 2 diabetes mellitus with diabetic polyneuropathy: Secondary | ICD-10-CM | POA: Diagnosis not present

## 2023-11-17 DIAGNOSIS — S82831D Other fracture of upper and lower end of right fibula, subsequent encounter for closed fracture with routine healing: Secondary | ICD-10-CM | POA: Diagnosis not present

## 2023-11-17 NOTE — Telephone Encounter (Signed)
Mychart messsage sent to patient.

## 2023-11-22 ENCOUNTER — Other Ambulatory Visit: Payer: Self-pay | Admitting: Internal Medicine

## 2023-11-22 DIAGNOSIS — F5101 Primary insomnia: Secondary | ICD-10-CM

## 2023-11-23 ENCOUNTER — Emergency Department (HOSPITAL_COMMUNITY)

## 2023-11-23 ENCOUNTER — Other Ambulatory Visit: Payer: Self-pay

## 2023-11-23 ENCOUNTER — Ambulatory Visit: Admitting: Orthopaedic Surgery

## 2023-11-23 ENCOUNTER — Encounter (HOSPITAL_COMMUNITY): Payer: Self-pay

## 2023-11-23 ENCOUNTER — Observation Stay (HOSPITAL_COMMUNITY)
Admission: EM | Admit: 2023-11-23 | Discharge: 2023-11-24 | Disposition: A | Discharge status: Home / Self Care | Attending: Internal Medicine | Admitting: Internal Medicine

## 2023-11-23 DIAGNOSIS — M1611 Unilateral primary osteoarthritis, right hip: Secondary | ICD-10-CM | POA: Diagnosis not present

## 2023-11-23 DIAGNOSIS — I959 Hypotension, unspecified: Secondary | ICD-10-CM | POA: Diagnosis not present

## 2023-11-23 DIAGNOSIS — I700 Atherosclerosis of aorta: Secondary | ICD-10-CM | POA: Insufficient documentation

## 2023-11-23 DIAGNOSIS — Z6841 Body Mass Index (BMI) 40.0 and over, adult: Secondary | ICD-10-CM | POA: Diagnosis not present

## 2023-11-23 DIAGNOSIS — D72819 Decreased white blood cell count, unspecified: Secondary | ICD-10-CM | POA: Insufficient documentation

## 2023-11-23 DIAGNOSIS — Z7984 Long term (current) use of oral hypoglycemic drugs: Secondary | ICD-10-CM | POA: Diagnosis not present

## 2023-11-23 DIAGNOSIS — M25511 Pain in right shoulder: Secondary | ICD-10-CM | POA: Insufficient documentation

## 2023-11-23 DIAGNOSIS — I8510 Secondary esophageal varices without bleeding: Secondary | ICD-10-CM

## 2023-11-23 DIAGNOSIS — M542 Cervicalgia: Secondary | ICD-10-CM | POA: Diagnosis not present

## 2023-11-23 DIAGNOSIS — S3991XA Unspecified injury of abdomen, initial encounter: Secondary | ICD-10-CM | POA: Diagnosis not present

## 2023-11-23 DIAGNOSIS — R935 Abnormal findings on diagnostic imaging of other abdominal regions, including retroperitoneum: Secondary | ICD-10-CM | POA: Diagnosis not present

## 2023-11-23 DIAGNOSIS — K7460 Unspecified cirrhosis of liver: Secondary | ICD-10-CM | POA: Insufficient documentation

## 2023-11-23 DIAGNOSIS — Z853 Personal history of malignant neoplasm of breast: Secondary | ICD-10-CM | POA: Diagnosis not present

## 2023-11-23 DIAGNOSIS — S7291XA Unspecified fracture of right femur, initial encounter for closed fracture: Secondary | ICD-10-CM | POA: Diagnosis present

## 2023-11-23 DIAGNOSIS — S36898A Other injury of other intra-abdominal organs, initial encounter: Secondary | ICD-10-CM | POA: Diagnosis not present

## 2023-11-23 DIAGNOSIS — Z96653 Presence of artificial knee joint, bilateral: Secondary | ICD-10-CM | POA: Insufficient documentation

## 2023-11-23 DIAGNOSIS — S8991XA Unspecified injury of right lower leg, initial encounter: Secondary | ICD-10-CM | POA: Diagnosis not present

## 2023-11-23 DIAGNOSIS — S72401D Unspecified fracture of lower end of right femur, subsequent encounter for closed fracture with routine healing: Secondary | ICD-10-CM | POA: Diagnosis not present

## 2023-11-23 DIAGNOSIS — E039 Hypothyroidism, unspecified: Secondary | ICD-10-CM | POA: Insufficient documentation

## 2023-11-23 DIAGNOSIS — E8721 Acute metabolic acidosis: Secondary | ICD-10-CM | POA: Diagnosis not present

## 2023-11-23 DIAGNOSIS — Z794 Long term (current) use of insulin: Secondary | ICD-10-CM | POA: Diagnosis not present

## 2023-11-23 DIAGNOSIS — S299XXA Unspecified injury of thorax, initial encounter: Secondary | ICD-10-CM | POA: Diagnosis not present

## 2023-11-23 DIAGNOSIS — E669 Obesity, unspecified: Secondary | ICD-10-CM | POA: Insufficient documentation

## 2023-11-23 DIAGNOSIS — R10819 Abdominal tenderness, unspecified site: Secondary | ICD-10-CM | POA: Diagnosis not present

## 2023-11-23 DIAGNOSIS — Z96651 Presence of right artificial knee joint: Secondary | ICD-10-CM | POA: Diagnosis not present

## 2023-11-23 DIAGNOSIS — Z79899 Other long term (current) drug therapy: Secondary | ICD-10-CM | POA: Diagnosis not present

## 2023-11-23 DIAGNOSIS — M47812 Spondylosis without myelopathy or radiculopathy, cervical region: Secondary | ICD-10-CM | POA: Diagnosis not present

## 2023-11-23 DIAGNOSIS — M19011 Primary osteoarthritis, right shoulder: Secondary | ICD-10-CM | POA: Diagnosis not present

## 2023-11-23 DIAGNOSIS — Z96611 Presence of right artificial shoulder joint: Secondary | ICD-10-CM | POA: Diagnosis not present

## 2023-11-23 DIAGNOSIS — D6949 Other primary thrombocytopenia: Secondary | ICD-10-CM | POA: Diagnosis not present

## 2023-11-23 DIAGNOSIS — Z4789 Encounter for other orthopedic aftercare: Secondary | ICD-10-CM | POA: Diagnosis not present

## 2023-11-23 DIAGNOSIS — D696 Thrombocytopenia, unspecified: Secondary | ICD-10-CM | POA: Insufficient documentation

## 2023-11-23 DIAGNOSIS — I10 Essential (primary) hypertension: Secondary | ICD-10-CM | POA: Insufficient documentation

## 2023-11-23 DIAGNOSIS — M19071 Primary osteoarthritis, right ankle and foot: Secondary | ICD-10-CM | POA: Diagnosis not present

## 2023-11-23 DIAGNOSIS — R109 Unspecified abdominal pain: Secondary | ICD-10-CM

## 2023-11-23 DIAGNOSIS — E1140 Type 2 diabetes mellitus with diabetic neuropathy, unspecified: Secondary | ICD-10-CM | POA: Insufficient documentation

## 2023-11-23 DIAGNOSIS — S0990XA Unspecified injury of head, initial encounter: Secondary | ICD-10-CM | POA: Insufficient documentation

## 2023-11-23 DIAGNOSIS — K7581 Nonalcoholic steatohepatitis (NASH): Secondary | ICD-10-CM | POA: Diagnosis not present

## 2023-11-23 DIAGNOSIS — K766 Portal hypertension: Secondary | ICD-10-CM | POA: Insufficient documentation

## 2023-11-23 DIAGNOSIS — R1031 Right lower quadrant pain: Secondary | ICD-10-CM | POA: Diagnosis not present

## 2023-11-23 LAB — URINALYSIS, ROUTINE W REFLEX MICROSCOPIC
Bilirubin Urine: NEGATIVE
Glucose, UA: >=500 mg/dL — AB
Hgb urine dipstick: NEGATIVE
Ketones, ur: NEGATIVE mg/dL
Leukocytes,Ua: NEGATIVE
Nitrite: NEGATIVE
Protein, ur: 100 mg/dL — AB
Specific Gravity, Urine: 1.031 — ABNORMAL HIGH (ref 1.005–1.030)
pH: 5 (ref 5.0–8.0)

## 2023-11-23 LAB — COMPREHENSIVE METABOLIC PANEL WITH GFR
ALT: 22 U/L (ref 0–44)
AST: 42 U/L — ABNORMAL HIGH (ref 15–41)
Albumin: 3 g/dL — ABNORMAL LOW (ref 3.5–5.0)
Alkaline Phosphatase: 127 U/L — ABNORMAL HIGH (ref 38–126)
Anion gap: 7 (ref 5–15)
BUN: 17 mg/dL (ref 8–23)
CO2: 20 mmol/L — ABNORMAL LOW (ref 22–32)
Calcium: 9.5 mg/dL (ref 8.9–10.3)
Chloride: 108 mmol/L (ref 98–111)
Creatinine, Ser: 0.97 mg/dL (ref 0.44–1.00)
GFR, Estimated: >60 mL/min (ref >60)
Glucose, Bld: 217 mg/dL — ABNORMAL HIGH (ref 70–99)
Potassium: 3.9 mmol/L (ref 3.5–5.1)
Sodium: 135 mmol/L (ref 135–145)
Total Bilirubin: 1.1 mg/dL (ref 0.0–1.2)
Total Protein: 7.8 g/dL (ref 6.5–8.1)

## 2023-11-23 LAB — CBC
HCT: 38.2 % (ref 36.0–46.0)
HCT: 33 % — ABNORMAL LOW (ref 36.0–46.0)
Hemoglobin: 12 g/dL (ref 12.0–15.0)
Hemoglobin: 10.3 g/dL — ABNORMAL LOW (ref 12.0–15.0)
MCH: 29.8 pg (ref 26.0–34.0)
MCH: 29.7 pg (ref 26.0–34.0)
MCHC: 31.4 g/dL (ref 30.0–36.0)
MCHC: 31.2 g/dL (ref 30.0–36.0)
MCV: 94.8 fL (ref 80.0–100.0)
MCV: 95.1 fL (ref 80.0–100.0)
Platelets: 67 K/uL — ABNORMAL LOW (ref 150–400)
Platelets: 63 K/uL — ABNORMAL LOW (ref 150–400)
RBC: 4.03 MIL/uL (ref 3.87–5.11)
RBC: 3.47 MIL/uL — ABNORMAL LOW (ref 3.87–5.11)
RDW: 16.6 % — ABNORMAL HIGH (ref 11.5–15.5)
RDW: 17 % — ABNORMAL HIGH (ref 11.5–15.5)
WBC: 2.7 K/uL — ABNORMAL LOW (ref 4.0–10.5)
WBC: 3.2 K/uL — ABNORMAL LOW (ref 4.0–10.5)
nRBC: 0 % (ref 0.0–0.2)
nRBC: 0 % (ref 0.0–0.2)

## 2023-11-23 LAB — I-STAT CHEM 8, ED
BUN: 19 mg/dL (ref 8–23)
Calcium, Ion: 1.29 mmol/L (ref 1.15–1.40)
Chloride: 108 mmol/L (ref 98–111)
Creatinine, Ser: 1 mg/dL (ref 0.44–1.00)
Glucose, Bld: 218 mg/dL — ABNORMAL HIGH (ref 70–99)
HCT: 36 % (ref 36.0–46.0)
Hemoglobin: 12.2 g/dL (ref 12.0–15.0)
Potassium: 4.1 mmol/L (ref 3.5–5.1)
Sodium: 141 mmol/L (ref 135–145)
TCO2: 21 mmol/L — ABNORMAL LOW (ref 22–32)

## 2023-11-23 LAB — I-STAT CG4 LACTIC ACID, ED: Lactic Acid, Venous: 2.1 mmol/L (ref 0.5–1.9)

## 2023-11-23 LAB — BRAIN NATRIURETIC PEPTIDE: B Natriuretic Peptide: 47.5 pg/mL (ref 0.0–100.0)

## 2023-11-23 LAB — BASIC METABOLIC PANEL WITH GFR
Anion gap: 7 (ref 5–15)
BUN: 18 mg/dL (ref 8–23)
CO2: 23 mmol/L (ref 22–32)
Calcium: 9 mg/dL (ref 8.9–10.3)
Chloride: 107 mmol/L (ref 98–111)
Creatinine, Ser: 1.15 mg/dL — ABNORMAL HIGH (ref 0.44–1.00)
GFR, Estimated: 50 mL/min — ABNORMAL LOW (ref >60)
Glucose, Bld: 152 mg/dL — ABNORMAL HIGH (ref 70–99)
Potassium: 3.8 mmol/L (ref 3.5–5.1)
Sodium: 137 mmol/L (ref 135–145)

## 2023-11-23 LAB — ETHANOL: Alcohol, Ethyl (B): <15 mg/dL (ref <15)

## 2023-11-23 LAB — TROPONIN I (HIGH SENSITIVITY)
Troponin I (High Sensitivity): 8 ng/L (ref <18)
Troponin I (High Sensitivity): 10 ng/L (ref <18)

## 2023-11-23 LAB — SAMPLE TO BLOOD BANK

## 2023-11-23 LAB — PROTIME-INR
INR: 1.4 — ABNORMAL HIGH (ref 0.8–1.2)
Prothrombin Time: 17.8 s — ABNORMAL HIGH (ref 11.4–15.2)

## 2023-11-23 LAB — LACTIC ACID, PLASMA: Lactic Acid, Venous: 2.1 mmol/L (ref 0.5–1.9)

## 2023-11-23 LAB — CBG MONITORING, ED: Glucose-Capillary: 156 mg/dL — ABNORMAL HIGH (ref 70–99)

## 2023-11-23 MED ORDER — FENTANYL CITRATE (PF) 50 MCG/ML IJ SOSY: 50.0000 ug | PREFILLED_SYRINGE | INTRAMUSCULAR | Status: DC | PRN

## 2023-11-23 MED ORDER — DAPAGLIFLOZIN PROPANEDIOL 10 MG PO TABS: 10.0000 mg | ORAL_TABLET | Freq: Every day | ORAL | Status: DC

## 2023-11-23 MED ORDER — EXEMESTANE 25 MG PO TABS
25.0000 mg | ORAL_TABLET | Freq: Every day | ORAL | Status: DC
  Administered 2023-11-24: 25 mg via ORAL
  Filled 2023-11-23: qty 1

## 2023-11-23 MED ORDER — BUSPIRONE HCL 15 MG PO TABS
7.5000 mg | ORAL_TABLET | Freq: Two times a day (BID) | ORAL | Status: DC
  Administered 2023-11-23 – 2023-11-24 (×2): 7.5 mg via ORAL
  Filled 2023-11-23 (×2): qty 1

## 2023-11-23 MED ORDER — INSULIN ASPART 100 UNIT/ML IJ SOLN: 0.0000 [IU] | Freq: Three times a day (TID) | INTRAMUSCULAR | Status: DC

## 2023-11-23 MED ORDER — SODIUM CHLORIDE 0.9 % IV BOLUS
1000.0000 mL | Freq: Once | INTRAVENOUS | Status: AC
  Administered 2023-11-23: 1000 mL via INTRAVENOUS

## 2023-11-23 MED ORDER — ONDANSETRON HCL 4 MG/2ML IJ SOLN: 4.0000 mg | Freq: Four times a day (QID) | INTRAMUSCULAR | Status: DC | PRN

## 2023-11-23 MED ORDER — MIRABEGRON ER 25 MG PO TB24
25.0000 mg | ORAL_TABLET | Freq: Every day | ORAL | Status: DC
  Administered 2023-11-24: 25 mg via ORAL
  Filled 2023-11-23: qty 1

## 2023-11-23 MED ORDER — INSULIN ASPART 100 UNIT/ML IJ SOLN: 0.0000 [IU] | Freq: Every day | INTRAMUSCULAR | Status: DC

## 2023-11-23 MED ORDER — FENTANYL CITRATE (PF) 50 MCG/ML IJ SOSY
50.0000 ug | PREFILLED_SYRINGE | Freq: Once | INTRAMUSCULAR | Status: AC
  Administered 2023-11-23: 50 ug via INTRAVENOUS
  Filled 2023-11-23: qty 1

## 2023-11-23 MED ORDER — SODIUM CHLORIDE 0.9% IV SOLUTION: Freq: Once | INTRAVENOUS | Status: AC

## 2023-11-23 MED ORDER — METHOCARBAMOL 500 MG PO TABS: 500.0000 mg | ORAL_TABLET | Freq: Three times a day (TID) | ORAL | Status: DC | PRN

## 2023-11-23 MED ORDER — BUPROPION HCL ER (XL) 150 MG PO TB24
150.0000 mg | ORAL_TABLET | Freq: Every day | ORAL | Status: DC
  Administered 2023-11-24: 150 mg via ORAL
  Filled 2023-11-23: qty 1

## 2023-11-23 MED ORDER — TRAZODONE HCL 50 MG PO TABS: 50.0000 mg | ORAL_TABLET | Freq: Every evening | ORAL | Status: DC | PRN

## 2023-11-23 MED ORDER — MYCOPHENOLATE MOFETIL 250 MG PO CAPS
1500.0000 mg | ORAL_CAPSULE | Freq: Two times a day (BID) | ORAL | Status: DC
  Administered 2023-11-23 – 2023-11-24 (×2): 1500 mg via ORAL
  Filled 2023-11-23 (×2): qty 6

## 2023-11-23 MED ORDER — INSULIN GLARGINE-YFGN 100 UNIT/ML ~~LOC~~ SOLN
30.0000 [IU] | Freq: Every day | SUBCUTANEOUS | Status: DC
  Administered 2023-11-23 – 2023-11-24 (×2): 30 [IU] via SUBCUTANEOUS
  Filled 2023-11-23 (×2): qty 0.3

## 2023-11-23 MED ORDER — SODIUM CHLORIDE 0.9 % IV SOLN: INTRAVENOUS | Status: DC

## 2023-11-23 MED ORDER — ACETAMINOPHEN 325 MG PO TABS: 650.0000 mg | ORAL_TABLET | Freq: Four times a day (QID) | ORAL | Status: DC | PRN

## 2023-11-23 MED ORDER — IOHEXOL 350 MG/ML SOLN
75.0000 mL | Freq: Once | INTRAVENOUS | Status: AC | PRN
  Administered 2023-11-23: 75 mL via INTRAVENOUS

## 2023-11-23 MED ORDER — LEVOTHYROXINE SODIUM 25 MCG PO TABS
137.0000 ug | ORAL_TABLET | Freq: Every day | ORAL | Status: DC
  Administered 2023-11-24: 137 ug via ORAL
  Filled 2023-11-23: qty 1

## 2023-11-23 MED ORDER — POLYETHYLENE GLYCOL 3350 17 G PO PACK: 17.0000 g | PACK | Freq: Every day | ORAL | Status: DC | PRN

## 2023-11-23 MED ORDER — OXYCODONE HCL 5 MG PO TABS: 5.0000 mg | ORAL_TABLET | ORAL | Status: DC | PRN

## 2023-11-23 MED ORDER — FENTANYL CITRATE (PF) 50 MCG/ML IJ SOSY: 12.5000 ug | PREFILLED_SYRINGE | INTRAMUSCULAR | Status: DC | PRN

## 2023-11-23 MED ORDER — PANTOPRAZOLE SODIUM 40 MG PO TBEC
40.0000 mg | DELAYED_RELEASE_TABLET | Freq: Every day | ORAL | Status: DC
  Administered 2023-11-23 – 2023-11-24 (×2): 40 mg via ORAL
  Filled 2023-11-23 (×2): qty 1

## 2023-11-23 MED ORDER — ACETAMINOPHEN 650 MG RE SUPP: 650.0000 mg | Freq: Four times a day (QID) | RECTAL | Status: DC | PRN

## 2023-11-23 MED ORDER — ALBUTEROL SULFATE (2.5 MG/3ML) 0.083% IN NEBU: 2.5000 mg | INHALATION_SOLUTION | RESPIRATORY_TRACT | Status: DC | PRN

## 2023-11-23 MED ORDER — LEVOTHYROXINE SODIUM 75 MCG PO TABS: 137.0000 ug | ORAL_TABLET | Freq: Every day | ORAL | Status: DC

## 2023-11-23 MED ORDER — ONDANSETRON HCL 4 MG PO TABS: 4.0000 mg | ORAL_TABLET | Freq: Four times a day (QID) | ORAL | Status: DC | PRN

## 2023-11-23 MED ORDER — DOCUSATE SODIUM 100 MG PO CAPS
100.0000 mg | ORAL_CAPSULE | Freq: Two times a day (BID) | ORAL | Status: DC
  Administered 2023-11-23 – 2023-11-24 (×2): 100 mg via ORAL
  Filled 2023-11-23 (×2): qty 1

## 2023-11-23 MED ORDER — IOHEXOL 350 MG/ML SOLN
100.0000 mL | Freq: Once | INTRAVENOUS | Status: AC | PRN
  Administered 2023-11-23: 100 mL via INTRAVENOUS

## 2023-11-23 MED ORDER — ONDANSETRON HCL 4 MG/2ML IJ SOLN
4.0000 mg | Freq: Once | INTRAMUSCULAR | Status: AC
  Administered 2023-11-23: 4 mg via INTRAVENOUS
  Filled 2023-11-23: qty 2

## 2023-11-23 MED ORDER — GABAPENTIN 300 MG PO CAPS
600.0000 mg | ORAL_CAPSULE | Freq: Three times a day (TID) | ORAL | Status: DC
Start: 2023-11-23 — End: 2023-11-24
  Administered 2023-11-23 – 2023-11-24 (×2): 600 mg via ORAL
  Filled 2023-11-23 (×2): qty 2

## 2023-11-23 NOTE — ED Notes (Signed)
Patient placed onto cardiac monitor
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
## 2023-11-23 NOTE — ED Notes (Signed)
 EDP notified of low blood pressure. EDP came to bedside to assess patient and update family. IV fluid bolus started. Pt is Aox4 at this time.
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
## 2023-11-23 NOTE — ED Notes (Signed)
 CCMD called.
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
## 2023-11-24 ENCOUNTER — Observation Stay (HOSPITAL_COMMUNITY)

## 2023-11-24 DIAGNOSIS — M19049 Primary osteoarthritis, unspecified hand: Secondary | ICD-10-CM | POA: Diagnosis not present

## 2023-11-24 DIAGNOSIS — D6949 Other primary thrombocytopenia: Secondary | ICD-10-CM | POA: Diagnosis not present

## 2023-11-24 DIAGNOSIS — M9701XD Periprosthetic fracture around internal prosthetic right hip joint, subsequent encounter: Secondary | ICD-10-CM | POA: Diagnosis not present

## 2023-11-24 DIAGNOSIS — S72491D Other fracture of lower end of right femur, subsequent encounter for closed fracture with routine healing: Secondary | ICD-10-CM | POA: Diagnosis not present

## 2023-11-24 DIAGNOSIS — S36898A Other injury of other intra-abdominal organs, initial encounter: Secondary | ICD-10-CM | POA: Diagnosis not present

## 2023-11-24 DIAGNOSIS — M9711XD Periprosthetic fracture around internal prosthetic right knee joint, subsequent encounter: Secondary | ICD-10-CM | POA: Diagnosis not present

## 2023-11-24 DIAGNOSIS — M1812 Unilateral primary osteoarthritis of first carpometacarpal joint, left hand: Secondary | ICD-10-CM | POA: Diagnosis not present

## 2023-11-24 DIAGNOSIS — S82831D Other fracture of upper and lower end of right fibula, subsequent encounter for closed fracture with routine healing: Secondary | ICD-10-CM | POA: Diagnosis not present

## 2023-11-24 DIAGNOSIS — I959 Hypotension, unspecified: Secondary | ICD-10-CM | POA: Diagnosis not present

## 2023-11-24 DIAGNOSIS — S6990XA Unspecified injury of unspecified wrist, hand and finger(s), initial encounter: Secondary | ICD-10-CM | POA: Diagnosis not present

## 2023-11-24 DIAGNOSIS — S6992XA Unspecified injury of left wrist, hand and finger(s), initial encounter: Secondary | ICD-10-CM | POA: Diagnosis not present

## 2023-11-24 DIAGNOSIS — E1142 Type 2 diabetes mellitus with diabetic polyneuropathy: Secondary | ICD-10-CM | POA: Diagnosis not present

## 2023-11-24 DIAGNOSIS — E1169 Type 2 diabetes mellitus with other specified complication: Secondary | ICD-10-CM | POA: Diagnosis not present

## 2023-11-24 DIAGNOSIS — E8721 Acute metabolic acidosis: Secondary | ICD-10-CM | POA: Diagnosis not present

## 2023-11-24 LAB — COMPREHENSIVE METABOLIC PANEL WITH GFR
ALT: 18 U/L (ref 0–44)
AST: 38 U/L (ref 15–41)
Albumin: 2.7 g/dL — ABNORMAL LOW (ref 3.5–5.0)
Alkaline Phosphatase: 83 U/L (ref 38–126)
Anion gap: 7 (ref 5–15)
BUN: 13 mg/dL (ref 8–23)
CO2: 21 mmol/L — ABNORMAL LOW (ref 22–32)
Calcium: 8.9 mg/dL (ref 8.9–10.3)
Chloride: 108 mmol/L (ref 98–111)
Creatinine, Ser: 0.92 mg/dL (ref 0.44–1.00)
GFR, Estimated: >60 mL/min (ref >60)
Glucose, Bld: 105 mg/dL — ABNORMAL HIGH (ref 70–99)
Potassium: 3.6 mmol/L (ref 3.5–5.1)
Sodium: 136 mmol/L (ref 135–145)
Total Bilirubin: 1.4 mg/dL — ABNORMAL HIGH (ref 0.0–1.2)
Total Protein: 6.8 g/dL (ref 6.5–8.1)

## 2023-11-24 LAB — CBC
HCT: 33.2 % — ABNORMAL LOW (ref 36.0–46.0)
Hemoglobin: 10.5 g/dL — ABNORMAL LOW (ref 12.0–15.0)
MCH: 29.6 pg (ref 26.0–34.0)
MCHC: 31.6 g/dL (ref 30.0–36.0)
MCV: 93.5 fL (ref 80.0–100.0)
Platelets: 60 K/uL — ABNORMAL LOW (ref 150–400)
RBC: 3.55 MIL/uL — ABNORMAL LOW (ref 3.87–5.11)
RDW: 18.2 % — ABNORMAL HIGH (ref 11.5–15.5)
WBC: 2.6 K/uL — ABNORMAL LOW (ref 4.0–10.5)
nRBC: 0 % (ref 0.0–0.2)

## 2023-11-24 LAB — TYPE AND SCREEN
ABO/RH(D): O POS
Antibody Screen: NEGATIVE
Unit division: 0

## 2023-11-24 LAB — BPAM RBC
Blood Product Expiration Date: 202510252359
ISSUE DATE / TIME: 202510221555
Unit Type and Rh: 5100

## 2023-11-24 LAB — CBG MONITORING, ED: Glucose-Capillary: 112 mg/dL — ABNORMAL HIGH (ref 70–99)

## 2023-11-24 LAB — LACTIC ACID, PLASMA: Lactic Acid, Venous: 1.1 mmol/L (ref 0.5–1.9)

## 2023-11-24 MED ORDER — SODIUM CHLORIDE 0.9% IV SOLUTION: Freq: Once | INTRAVENOUS | Status: DC

## 2023-11-24 MED ORDER — CARVEDILOL 6.25 MG PO TABS: 6.2500 mg | ORAL_TABLET | Freq: Two times a day (BID) | ORAL | Status: AC

## 2023-11-24 MED ORDER — LOSARTAN POTASSIUM 50 MG PO TABS: 50.0000 mg | ORAL_TABLET | Freq: Every day | ORAL | Status: AC

## 2023-11-24 MED ORDER — FENTANYL CITRATE (PF) 50 MCG/ML IJ SOSY: 50.0000 ug | PREFILLED_SYRINGE | Freq: Once | INTRAMUSCULAR | Status: DC

## 2023-11-24 NOTE — Progress Notes (Signed)
 Transition of Care [facility]) - CAGE-AID Screening   Patient Details  Name: Latiya Navia Gutzmer MRN: [id number] Date of Birth: 1950-10-20   MARINDA LIONEL Sora, RN Phone Number: 11/24/2023, 5:30 AM   Clinical Narrative: No etoh/drug/tobacco usage, no resources needed   CAGE-AID Screening:    Have You Ever Felt You Ought to Cut Down on Your Drinking or Drug Use?: No Have People Annoyed You By [employer] Your Drinking Or Drug Use?: No Have You Felt Bad Or Guilty About Your Drinking Or Drug Use?: No Have You Ever Had a Drink or Used Drugs First Thing In The Morning to Steady Your Nerves or to Get Rid of a Hangover?: No CAGE-AID Score: 0
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
## 2023-11-24 NOTE — ED Notes (Signed)
Daughter in law updated

## 2023-11-24 NOTE — ED Notes (Addendum)
 Patient noticed to have bruising and swelling to bilateral hands, worse on the left.  MD Rathore made aware via secure chat.

## 2023-11-24 NOTE — ED Notes (Signed)
 Wrist band on right wrist removed and replaced with another d/t swelling.
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
## 2023-11-24 NOTE — Plan of Care (Signed)
 On review of the chart, labs, and xrays it appears [name] is doing well. I have asked PT/OT to evaluate her to determine if she requires any [facility] or outpatient PT/OT. Otherwise, assuming she is doing well when I see her in person this morning, I plan to discharge her home today.   [name] [name] Moores, MD [facility] Office  [phone]
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
## 2023-11-26 ENCOUNTER — Telehealth: Admitting: Physician Assistant

## 2023-11-26 DIAGNOSIS — I10 Essential (primary) hypertension: Secondary | ICD-10-CM | POA: Diagnosis not present

## 2023-11-26 DIAGNOSIS — J069 Acute upper respiratory infection, unspecified: Secondary | ICD-10-CM | POA: Diagnosis not present

## 2023-11-26 DIAGNOSIS — K7460 Unspecified cirrhosis of liver: Secondary | ICD-10-CM | POA: Diagnosis not present

## 2023-11-26 DIAGNOSIS — M9711XD Periprosthetic fracture around internal prosthetic right knee joint, subsequent encounter: Secondary | ICD-10-CM | POA: Diagnosis not present

## 2023-11-26 DIAGNOSIS — Z556 Problems related to health literacy: Secondary | ICD-10-CM | POA: Diagnosis not present

## 2023-11-26 DIAGNOSIS — E039 Hypothyroidism, unspecified: Secondary | ICD-10-CM | POA: Diagnosis not present

## 2023-11-26 DIAGNOSIS — G4733 Obstructive sleep apnea (adult) (pediatric): Secondary | ICD-10-CM | POA: Diagnosis not present

## 2023-11-26 DIAGNOSIS — K219 Gastro-esophageal reflux disease without esophagitis: Secondary | ICD-10-CM | POA: Diagnosis not present

## 2023-11-26 DIAGNOSIS — E66813 Obesity, class 3: Secondary | ICD-10-CM | POA: Diagnosis not present

## 2023-11-26 DIAGNOSIS — Z794 Long term (current) use of insulin: Secondary | ICD-10-CM | POA: Diagnosis not present

## 2023-11-26 DIAGNOSIS — E785 Hyperlipidemia, unspecified: Secondary | ICD-10-CM | POA: Diagnosis not present

## 2023-11-26 DIAGNOSIS — E1142 Type 2 diabetes mellitus with diabetic polyneuropathy: Secondary | ICD-10-CM | POA: Diagnosis not present

## 2023-11-26 DIAGNOSIS — S72491D Other fracture of lower end of right femur, subsequent encounter for closed fracture with routine healing: Secondary | ICD-10-CM | POA: Diagnosis not present

## 2023-11-26 DIAGNOSIS — S82831D Other fracture of upper and lower end of right fibula, subsequent encounter for closed fracture with routine healing: Secondary | ICD-10-CM | POA: Diagnosis not present

## 2023-11-26 DIAGNOSIS — F3340 Major depressive disorder, recurrent, in remission, unspecified: Secondary | ICD-10-CM | POA: Diagnosis not present

## 2023-11-26 DIAGNOSIS — R296 Repeated falls: Secondary | ICD-10-CM | POA: Diagnosis not present

## 2023-11-26 DIAGNOSIS — D630 Anemia in neoplastic disease: Secondary | ICD-10-CM | POA: Diagnosis not present

## 2023-11-26 DIAGNOSIS — C50412 Malignant neoplasm of upper-outer quadrant of left female breast: Secondary | ICD-10-CM | POA: Diagnosis not present

## 2023-11-26 DIAGNOSIS — M109 Gout, unspecified: Secondary | ICD-10-CM | POA: Diagnosis not present

## 2023-11-26 DIAGNOSIS — E1169 Type 2 diabetes mellitus with other specified complication: Secondary | ICD-10-CM | POA: Diagnosis not present

## 2023-11-26 DIAGNOSIS — F419 Anxiety disorder, unspecified: Secondary | ICD-10-CM | POA: Diagnosis not present

## 2023-11-26 DIAGNOSIS — M9701XD Periprosthetic fracture around internal prosthetic right hip joint, subsequent encounter: Secondary | ICD-10-CM | POA: Diagnosis not present

## 2023-11-26 DIAGNOSIS — L121 Cicatricial pemphigoid: Secondary | ICD-10-CM | POA: Diagnosis not present

## 2023-11-26 DIAGNOSIS — Z6841 Body Mass Index (BMI) 40.0 and over, adult: Secondary | ICD-10-CM | POA: Diagnosis not present

## 2023-11-26 DIAGNOSIS — D696 Thrombocytopenia, unspecified: Secondary | ICD-10-CM | POA: Diagnosis not present

## 2023-11-26 DIAGNOSIS — M150 Primary generalized (osteo)arthritis: Secondary | ICD-10-CM | POA: Diagnosis not present

## 2023-11-26 MED ORDER — AZELASTINE HCL 0.1 % NA SOLN: 2.0000 | Freq: Two times a day (BID) | NASAL | 12 refills | Status: AC

## 2023-11-26 MED ORDER — CETIRIZINE HCL 10 MG PO TABS: 10.0000 mg | ORAL_TABLET | Freq: Every day | ORAL | 0 refills | Status: DC

## 2023-11-26 NOTE — Patient Instructions (Signed)
 [name] [name], thank you for joining Teena Shuck, PA-C for today's virtual visit.  While this provider is not your primary care provider (PCP), if your PCP is located in our provider database this encounter information will be shared with them immediately following your visit.   A [facility] MyChart account gives you access to today's visit and all your visits, tests, and labs performed at [facility]  click here if you don't have a [facility] MyChart account or go to [email or web address]  Consent: (Patient) [name] [name] provided verbal consent for this virtual visit at the beginning of the encounter.  Current Medications:  Current Outpatient Medications:    ACCU-CHEK AVIVA PLUS test strip, USE AS INSTRUCTED TO CHECK BLOOD SUGAR 2 TIMES DAILY, Disp: 100 strip, Rfl: 5   acetaminophen  (TYLENOL ) 500 MG tablet, Take 500 mg by mouth every 8 (eight) hours as needed for moderate pain (pain score 4-6)., Disp: , Rfl:    albuterol  (VENTOLIN  HFA) 108 (90 Base) MCG/ACT inhaler, Inhale 1-2 puffs into the lungs every 4 (four) hours as needed for wheezing or shortness of breath. (Patient not taking: Reported on 11/23/2023), Disp: 18 g, Rfl: 2   Ascorbic Acid (VITAMIN C PO), Take 1 tablet by mouth daily., Disp: , Rfl:    buPROPion  (WELLBUTRIN  XL) 150 MG 24 hr tablet, TAKE 1 TABLET BY MOUTH EVERY DAY IN THE MORNING, Disp: 90 tablet, Rfl: 1   busPIRone  (BUSPAR ) 7.5 MG tablet, Take 1 tablet (7.5 mg total) by mouth 2 (two) times daily., Disp: 60 tablet, Rfl: 3   calcium  carbonate (OS-CAL - DOSED IN MG OF ELEMENTAL CALCIUM ) 1250 (500 Ca) MG tablet, Take 1 tablet by mouth daily with breakfast., Disp: , Rfl:    carvedilol  (COREG ) 6.25 MG tablet, Take 1 tablet (6.25 mg total) by mouth 2 (two) times daily with a meal., Disp: , Rfl:    Continuous Glucose Receiver (DEXCOM G7 RECEIVER) DEVI, Use receiver for continuous glucose monitoring daily daily; Duration: 365 days, Disp: , Rfl:    Continuous  Glucose Sensor (DEXCOM G7 SENSOR) MISC, Use 1 sensor for continuous glucose monitoring every 10 days; Duration: 30 days, Disp: , Rfl:    Cyanocobalamin  (VITAMIN B-12 PO), Take 1 tablet by mouth daily., Disp: , Rfl:    exemestane  (AROMASIN ) 25 MG tablet, Take 1 tablet (25 mg total) by mouth daily after breakfast., Disp: 90 tablet, Rfl: 1   FARXIGA  10 MG TABS tablet, TAKE 1 TABLET BY MOUTH DAILY BEFORE BREAKFAST. (Patient taking differently: Take 5 mg by mouth daily before breakfast.), Disp: 90 tablet, Rfl: 3   FIASP  FLEXTOUCH 100 UNIT/ML FlexTouch Pen, Inject 22 Units into the skin 3 (three) times daily after meals., Disp: , Rfl:    fluticasone (FLONASE) 50 MCG/ACT nasal spray, Place 1 spray into both nostrils daily as needed for allergies., Disp: , Rfl:    gabapentin  (NEURONTIN ) 600 MG tablet, TAKE 1 TABLET IN THE MORNING AND 1 TABLET IN THE AFTERNOON AND 2TABS AT BEDTIME, Disp: , Rfl:    Immune Globulin 10% (PRIVIGEN ) 20 GM/200ML SOLN, 500 mL for 4 days Intravenous every 3 weeks, Disp: , Rfl:    insulin  glargine (LANTUS  SOLOSTAR) 100 UNIT/ML Solostar Pen, Inject 62 Units into the skin daily., Disp: 30 mL, Rfl: 0   Insulin  Pen Needle (B-D UF III MINI PEN NEEDLES) 31G X 5 MM MISC, USE AS DIRECTED WITH INSULIN  EVERY MORNING, NOON, EVENING AND AT BEDTIME, Disp: 400 each, Rfl: 3   Insulin  Pen Needle  30G X 5 MM MISC, 1 Device by Does not apply route in the morning, at noon, in the evening, and at bedtime., Disp: 400 each, Rfl: 3   ketoconazole  (NIZORAL ) 2 % cream, Apply 1 Application topically daily., Disp: 60 g, Rfl: 0   levothyroxine  (SYNTHROID ) 137 MCG tablet, Take 137 mcg by mouth daily., Disp: , Rfl:    [START ON 11/27/2023] losartan  (COZAAR ) 50 MG tablet, Take 1 tablet (50 mg total) by mouth daily., Disp: , Rfl:    meclizine  (ANTIVERT ) 25 MG tablet, Take 1 tablet (25 mg total) by mouth 3 (three) times daily as needed for dizziness., Disp: 30 tablet, Rfl: 0   methocarbamol  (ROBAXIN ) 500 MG tablet,  Take 1 tablet (500 mg total) by mouth every 8 (eight) hours as needed for muscle spasms. (Patient not taking: Reported on 11/23/2023), Disp: 30 tablet, Rfl: 0   mirabegron  ER (MYRBETRIQ ) 25 MG TB24 tablet, Take 1 tablet (25 mg total) by mouth daily., Disp: 30 tablet, Rfl: 11   Misc. Devices MISC, Blood pressure cuff/device - 1. ICD10: I10, Disp: 1 each, Rfl: 0   Multiple Vitamins-Minerals (MULTIVITAMIN WITH MINERALS) tablet, Take 1 tablet by mouth daily., Disp: , Rfl:    mycophenolate  (CELLCEPT ) 500 MG tablet, Take 1,500 mg by mouth 2 (two) times daily., Disp: , Rfl:    omeprazole  (PRILOSEC) 20 MG capsule, TAKE 1 CAPSULE BY MOUTH EVERY DAY, Disp: 90 capsule, Rfl: 3   ondansetron  (ZOFRAN ) 4 MG tablet, Take 1 tablet (4 mg total) by mouth every 8 (eight) hours as needed for nausea or vomiting., Disp: 90 tablet, Rfl: 1   Pyridoxine HCl (VITAMIN B-6 PO), Take 1 tablet by mouth daily., Disp: , Rfl:    Semaglutide , 1 MG/DOSE, (OZEMPIC , 1 MG/DOSE,) 4 MG/3ML SOPN, Inject 1 mg into the skin once a week. (Patient not taking: Reported on 11/23/2023), Disp: 3 mL, Rfl: 5   sucralfate  (CARAFATE ) 1 g tablet, Take 1 tablet (1 g total) by mouth 4 (four) times daily -  with meals and at bedtime for 7 days. (Patient taking differently: Take 1 g by mouth 4 (four) times daily -  with meals and at bedtime. As needed.), Disp: 28 tablet, Rfl: 0   traMADol  (ULTRAM ) 50 MG tablet, Take 1-2 tablets (50-100 mg total) by mouth every 6 (six) hours as needed., Disp: 30 tablet, Rfl: 0   traZODone  (DESYREL ) 50 MG tablet, TAKE 0.5-1 TABLETS BY MOUTH AT BEDTIME AS NEEDED FOR SLEEP., Disp: 30 tablet, Rfl: 3   VITAMIN D PO, Take 1 tablet by mouth daily., Disp: , Rfl:    Medications ordered in this encounter:  No orders of the defined types were placed in this encounter.    *If you need refills on other medications prior to your next appointment, please contact your pharmacy*  Follow-Up: Call back or seek an in-person evaluation if  the symptoms worsen or if the condition fails to improve as anticipated.  [facility] [phone]  Other Instructions Please report to the nearest Emergency room with any worsening symptoms. Follow up with primary care provider (PCP) in 2 -3 days.    If you have been instructed to have an in-person evaluation today at a local Urgent Care facility, please use the link below. It will take you to a list of all of our available [facility], including address, phone number and hours of operation. Please do not delay care.  [facility]  If you or a  family member do not have a primary care provider, use the link below to schedule a visit and establish care. When you choose a [facility] primary care physician or advanced practice provider, you gain a long-term partner in health. Find a Primary Care Provider  Learn more about [facility]'s in-office and virtual care options:  - Get Care Now

## 2023-11-26 NOTE — Progress Notes (Signed)
 Virtual Visit Consent   [name] [name], you are scheduled for a virtual visit with a [facility] provider today. Just as with appointments in the office, your consent must be obtained to participate. Your consent will be active for this visit and any virtual visit you [place] have with one of our providers in the next 365 days. If you have a MyChart account, a copy of this consent can be sent to you electronically.  As this is a virtual visit, video technology does not allow for your provider to perform a traditional examination. This [place] limit your provider's ability to fully assess your condition. If your provider identifies any concerns that need to be evaluated in person or the need to arrange testing (such as labs, EKG, etc.), we will make arrangements to do so. Although advances in technology are sophisticated, we cannot ensure that it will always work on either your end or our end. If the connection with a video visit is poor, the visit [place] have to be switched to a telephone visit. With either a video or telephone visit, we are not always able to ensure that we have a secure connection.  By engaging in this virtual visit, you consent to the provision of healthcare and authorize for your insurance to be billed (if applicable) for the services provided during this visit. Depending on your insurance coverage, you [place] receive a charge related to this service.  I need to obtain your verbal consent now. Are you willing to proceed with your visit today? [name] [name] has provided verbal consent on 11/26/2023 for a virtual visit (video or telephone). [name] [name], [place]  Date: 11/26/2023 1:50 PM   Virtual Visit via Video Note   I, [name] [name], connected with  [name] [name]  ([id number], 1950/02/16) on 11/26/23 at  1:15 PM EDT by a video-enabled telemedicine application and verified that I am speaking with the correct person using two identifiers.  Location: Patient: Virtual Visit Location Patient:  Home Provider: Virtual Visit Location Provider: Home Office   I discussed the limitations of evaluation and management by telemedicine and the availability of in person appointments. The patient expressed understanding and agreed to proceed.    History of Present Illness: [name] [name] is a 73 y.o. who identifies as a female who was assigned female at birth, and is being seen today for URI.  HPI: URI  This is a new problem. The current episode started yesterday. The problem has been unchanged. There has been no fever. Associated symptoms include rhinorrhea and a sore throat. She has tried nothing for the symptoms.    Problems:  Patient Active Problem List   Diagnosis Date Noted   Hypotension 11/23/2023   Periumbilical abdominal pain 09/26/2023   Microcytic anemia 07/28/2023   Closed left femoral fracture (HCC) 05/01/2023   Right femoral fracture (HCC) 05/01/2023   Hypothyroidism 05/01/2023   Physical deconditioning 03/30/2023   Fall 11/25/2022   Esophageal varices (HCC) 10/19/2022   COVID-19 virus infection 09/17/2022   Immunosuppressed status 09/17/2022   Abnormal CT of the abdomen 08/09/2022   Paraumbilical hernia 07/29/2022   Esophageal varices in cirrhosis (HCC) 07/29/2022   Cirrhosis of liver without ascites (HCC) 07/12/2022   LUQ abdominal pain 07/05/2022   Drug-induced constipation 07/05/2022   GAD (generalized anxiety disorder) 08/28/2021   Disorder of intervertebral disc of cervical spine 08/28/2021   Family history of malignant neoplasm of digestive organs 08/28/2021   Malignant neoplasm of right breast (HCC) 07/08/2021  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   Pseudophakia of both eyes 04/01/2021   Retinal edema 04/01/2021   Trichiasis of eyelid of both eyes 04/01/2021   Hypertrophy of inferior nasal turbinate 02/26/2021   Allergic rhinitis due to pollen 01/29/2021   Allergy to influenza vaccine 01/29/2021   Gout 01/29/2021   Hyperlipidemia 01/29/2021    Nonalcoholic steatohepatitis (NASH) 01/29/2021   Primary insomnia 01/29/2021   Recurrent major depression in remission 01/29/2021   Arthritis of right ankle 04/14/2020   Nasal septal deviation 03/11/2020   Arthritis of right subtalar joint 10/09/2019   Acquired pes planovalgus of right foot 10/09/2019   Acquired posterior equinus of right lower extremity 10/09/2019   Epistaxis, recurrent 09/26/2019   OSA on CPAP 09/26/2019   Acquired hypothyroidism 05/21/2019   Arthritis of shoulder 09/07/2018   Bilateral hearing loss 08/01/2018   Vertigo 08/01/2018   Chronic nonintractable headache 08/01/2018   Type 2 diabetes mellitus with hyperlipidemia (HCC) 11/16/2016   Bilateral lower extremity edema 09/01/2016   Diabetic polyneuropathy associated with type 2 diabetes mellitus (HCC) 06/03/2016   Malignant neoplasm of upper-outer quadrant of left breast in female, estrogen receptor positive (HCC) 08/19/2015   Osteoarthritis of right knee 02/28/2015   Status post total right knee replacement 02/28/2015   Splenomegaly 07/30/2014   Thrombocytopenia 07/30/2014   Recurrent falls 01/12/2013   Class 3 obesity (HCC) 01/12/2013   Degenerative arthritis of spine 01/12/2013   Essential hypertension 09/05/2012    Allergies:  Allergies  Allergen Reactions   Codeine  Hives, Itching and Swelling   Hydrocodone -Acetaminophen  Shortness Of Breath, Itching, Nausea And Vomiting and Swelling   Influenza A (H1n1) Mon[place] Pf Anaphylaxis   Lisinopril  Cough   Metformin Diarrhea   Pregabalin     Unknown reaction   Jardiance [Empagliflozin] Other (See Comments)    dehydration   Medications:  Current Outpatient Medications:    ACCU-CHEK AVIVA PLUS test strip, USE AS INSTRUCTED TO CHECK BLOOD SUGAR 2 TIMES DAILY, Disp: 100 strip, Rfl: 5   acetaminophen  (TYLENOL ) 500 MG tablet, Take 500 mg by mouth every 8 (eight) hours as needed for moderate pain (pain score 4-6)., Disp: , Rfl:    albuterol  (VENTOLIN  HFA) 108 (90  Base) MCG/ACT inhaler, Inhale 1-2 puffs into the lungs every 4 (four) hours as needed for wheezing or shortness of breath. (Patient not taking: Reported on 11/23/2023), Disp: 18 g, Rfl: 2   Ascorbic Acid (VITAMIN C PO), Take 1 tablet by mouth daily., Disp: , Rfl:    buPROPion  (WELLBUTRIN  XL) 150 MG 24 hr tablet, TAKE 1 TABLET BY MOUTH EVERY DAY IN THE MORNING, Disp: 90 tablet, Rfl: 1   busPIRone  (BUSPAR ) 7.5 MG tablet, Take 1 tablet (7.5 mg total) by mouth 2 (two) times daily., Disp: 60 tablet, Rfl: 3   calcium  carbonate (OS-CAL - DOSED IN MG OF ELEMENTAL CALCIUM ) 1250 (500 Ca) MG tablet, Take 1 tablet by mouth daily with breakfast., Disp: , Rfl:    carvedilol  (COREG ) 6.25 MG tablet, Take 1 tablet (6.25 mg total) by mouth 2 (two) times daily with a meal., Disp: , Rfl:    Continuous Glucose Receiver (DEXCOM G7 RECEIVER) DEVI, Use receiver for continuous glucose monitoring daily daily; Duration: 365 days, Disp: , Rfl:    Continuous Glucose Sensor (DEXCOM G7 SENSOR) MISC, Use 1 sensor for continuous glucose monitoring every 10 days; Duration: 30 days, Disp: , Rfl:    Cyanocobalamin  (VITAMIN B-12 PO), Take 1 tablet by mouth daily., Disp: , Rfl:  exemestane  (AROMASIN ) 25 MG tablet, Take 1 tablet (25 mg total) by mouth daily after breakfast., Disp: 90 tablet, Rfl: 1   FARXIGA  10 MG TABS tablet, TAKE 1 TABLET BY MOUTH DAILY BEFORE BREAKFAST. (Patient taking differently: Take 5 mg by mouth daily before breakfast.), Disp: 90 tablet, Rfl: 3   FIASP  FLEXTOUCH 100 UNIT/ML FlexTouch Pen, Inject 22 Units into the skin 3 (three) times daily after meals., Disp: , Rfl:    fluticasone (FLONASE) 50 MCG/ACT nasal spray, Place 1 spray into both nostrils daily as needed for allergies., Disp: , Rfl:    gabapentin  (NEURONTIN ) 600 MG tablet, TAKE 1 TABLET IN THE MORNING AND 1 TABLET IN THE AFTERNOON AND 2TABS AT BEDTIME, Disp: , Rfl:    Immune Globulin 10% (PRIVIGEN ) 20 GM/200ML SOLN, 500 mL for 4 days Intravenous every 3  weeks, Disp: , Rfl:    insulin  glargine (LANTUS  SOLOSTAR) 100 UNIT/ML Solostar Pen, Inject 62 Units into the skin daily., Disp: 30 mL, Rfl: 0   Insulin  Pen Needle (B-D UF III MINI PEN NEEDLES) 31G X 5 MM MISC, USE AS DIRECTED WITH INSULIN  EVERY MORNING, NOON, EVENING AND AT BEDTIME, Disp: 400 each, Rfl: 3   Insulin  Pen Needle 30G X 5 MM MISC, 1 Device by Does not apply route in the morning, at noon, in the evening, and at bedtime., Disp: 400 each, Rfl: 3   ketoconazole  (NIZORAL ) 2 % cream, Apply 1 Application topically daily., Disp: 60 g, Rfl: 0   levothyroxine  (SYNTHROID ) 137 MCG tablet, Take 137 mcg by mouth daily., Disp: , Rfl:    [START ON 11/27/2023] losartan  (COZAAR ) 50 MG tablet, Take 1 tablet (50 mg total) by mouth daily., Disp: , Rfl:    meclizine  (ANTIVERT ) 25 MG tablet, Take 1 tablet (25 mg total) by mouth 3 (three) times daily as needed for dizziness., Disp: 30 tablet, Rfl: 0   methocarbamol  (ROBAXIN ) 500 MG tablet, Take 1 tablet (500 mg total) by mouth every 8 (eight) hours as needed for muscle spasms. (Patient not taking: Reported on 11/23/2023), Disp: 30 tablet, Rfl: 0   mirabegron  ER (MYRBETRIQ ) 25 MG TB24 tablet, Take 1 tablet (25 mg total) by mouth daily., Disp: 30 tablet, Rfl: 11   Misc. Devices MISC, Blood pressure cuff/device - 1. ICD10: I10, Disp: 1 each, Rfl: 0   Multiple Vitamins-Minerals (MULTIVITAMIN WITH MINERALS) tablet, Take 1 tablet by mouth daily., Disp: , Rfl:    mycophenolate  (CELLCEPT ) 500 MG tablet, Take 1,500 mg by mouth 2 (two) times daily., Disp: , Rfl:    omeprazole  (PRILOSEC) 20 MG capsule, TAKE 1 CAPSULE BY MOUTH EVERY DAY, Disp: 90 capsule, Rfl: 3   ondansetron  (ZOFRAN ) 4 MG tablet, Take 1 tablet (4 mg total) by mouth every 8 (eight) hours as needed for nausea or vomiting., Disp: 90 tablet, Rfl: 1   Pyridoxine HCl (VITAMIN B-6 PO), Take 1 tablet by mouth daily., Disp: , Rfl:    Semaglutide , 1 MG/DOSE, (OZEMPIC , 1 MG/DOSE,) 4 MG/3ML SOPN, Inject 1 mg into the  skin once a week. (Patient not taking: Reported on 11/23/2023), Disp: 3 mL, Rfl: 5   sucralfate  (CARAFATE ) 1 g tablet, Take 1 tablet (1 g total) by mouth 4 (four) times daily -  with meals and at bedtime for 7 days. (Patient taking differently: Take 1 g by mouth 4 (four) times daily -  with meals and at bedtime. As needed.), Disp: 28 tablet, Rfl: 0   traMADol  (ULTRAM ) 50 MG tablet, Take 1-2 tablets (50-100 mg  total) by mouth every 6 (six) hours as needed., Disp: 30 tablet, Rfl: 0   traZODone  (DESYREL ) 50 MG tablet, TAKE 0.5-1 TABLETS BY MOUTH AT BEDTIME AS NEEDED FOR SLEEP., Disp: 30 tablet, Rfl: 3   VITAMIN D PO, Take 1 tablet by mouth daily., Disp: , Rfl:   Observations/Objective: Patient is well-developed, well-nourished in no acute distress.  Resting comfortably  at home.  Head is normocephalic, atraumatic.  No labored breathing.  Speech is clear and coherent with logical content.  Patient is alert and oriented at baseline.    Assessment and Plan: 1. Upper respiratory tract infection, unspecified type (Primary)   Patient presenting with URI. Differentials include allergic rhinitis, COVID,  bacterial pneumonia, sinusitis. Do not suspect underlying cardiopulmonary process. I considered, but think unlikely, dangerous causes of this patient's symptoms to include ACS, CHF or pneumonia, pneumothorax. Patient is nontoxic and not in need of emergent medical intervention. I did advise patient to complete an at home COVID test.   Plan: reassurance, reassessment, over the counter medications, discharge with PCP follow-up  Follow Up Instructions: I discussed the assessment and treatment plan with the patient. The patient was provided an opportunity to ask questions and all were answered. The patient agreed with the plan and demonstrated an understanding of the instructions.  A copy of instructions were sent to the patient via MyChart unless otherwise noted below.     The patient was advised to  call back or seek an in-person evaluation if the symptoms worsen or if the condition fails to improve as anticipated.    [name] Shuck, PA-C

## 2023-11-28 ENCOUNTER — Ambulatory Visit (INDEPENDENT_AMBULATORY_CARE_PROVIDER_SITE_OTHER): Admitting: Internal Medicine

## 2023-11-28 ENCOUNTER — Encounter: Payer: Self-pay | Admitting: Internal Medicine

## 2023-11-28 VITALS — BP 129/72 | HR 67 | Ht 61.0 in

## 2023-11-28 DIAGNOSIS — Z09 Encounter for follow-up examination after completed treatment for conditions other than malignant neoplasm: Secondary | ICD-10-CM | POA: Insufficient documentation

## 2023-11-28 DIAGNOSIS — I10 Essential (primary) hypertension: Secondary | ICD-10-CM

## 2023-11-28 DIAGNOSIS — S40911A Unspecified superficial injury of right shoulder, initial encounter: Secondary | ICD-10-CM | POA: Diagnosis not present

## 2023-11-28 DIAGNOSIS — J0111 Acute recurrent frontal sinusitis: Secondary | ICD-10-CM | POA: Insufficient documentation

## 2023-11-28 DIAGNOSIS — T148XXA Other injury of unspecified body region, initial encounter: Secondary | ICD-10-CM

## 2023-11-28 MED ORDER — AMOXICILLIN-POT CLAVULANATE 875-125 MG PO TABS: 1.0000 | ORAL_TABLET | Freq: Two times a day (BID) | ORAL | 0 refills | Status: DC
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
## 2023-11-28 NOTE — Assessment & Plan Note (Addendum)
 Started empiric Augmentin  as she has persistent symptoms despite symptomatic treatment Azelastine nasal spray as needed for nasal congestion/allergies Zyrtec for nasal congestion/allergies Advised to use sinus inhaler and/or vaporizer for nasal congestion

## 2023-11-28 NOTE — Assessment & Plan Note (Addendum)
 BP Readings from Last 1 Encounters:  11/28/23 129/72   Well-controlled with Coreg  6.25 mg BID and Losartan  50 mg QD Counseled for compliance with the medications Advised DASH diet and moderate exercise/walking as tolerated

## 2023-11-28 NOTE — Patient Instructions (Signed)
 Please start taking Augmentin  as prescribed for sinusitis.  Please use Vaporizer and/or sinus inhaler as needed for nasal congestion.

## 2023-11-28 NOTE — Assessment & Plan Note (Signed)
 ER chart reviewed, including imaging and blood tests Has bruising behind right shoulder, but improving now -advised to apply warm compresses as needed
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
## 2023-11-29 ENCOUNTER — Ambulatory Visit: Payer: Self-pay | Admitting: Internal Medicine

## 2023-11-29 ENCOUNTER — Telehealth: Payer: Self-pay

## 2023-11-29 DIAGNOSIS — H02053 Trichiasis without entropian right eye, unspecified eyelid: Secondary | ICD-10-CM | POA: Diagnosis not present

## 2023-11-29 DIAGNOSIS — Z79899 Other long term (current) drug therapy: Secondary | ICD-10-CM | POA: Diagnosis not present

## 2023-11-29 DIAGNOSIS — Z961 Presence of intraocular lens: Secondary | ICD-10-CM | POA: Diagnosis not present

## 2023-11-29 DIAGNOSIS — H3581 Retinal edema: Secondary | ICD-10-CM | POA: Diagnosis not present

## 2023-11-29 DIAGNOSIS — E119 Type 2 diabetes mellitus without complications: Secondary | ICD-10-CM | POA: Diagnosis not present

## 2023-11-29 DIAGNOSIS — L121 Cicatricial pemphigoid: Secondary | ICD-10-CM | POA: Diagnosis not present

## 2023-11-29 DIAGNOSIS — H02056 Trichiasis without entropian left eye, unspecified eyelid: Secondary | ICD-10-CM | POA: Diagnosis not present

## 2023-11-29 LAB — CBC
Hematocrit: 38.4 % (ref 34.0–46.6)
Hemoglobin: 12.1 g/dL (ref 11.1–15.9)
MCH: 29.6 pg (ref 26.6–33.0)
MCHC: 31.5 g/dL (ref 31.5–35.7)
MCV: 94 fL (ref 79–97)
Platelets: 62 x10E3/uL — CL (ref 150–450)
RBC: 4.09 x10E6/uL (ref 3.77–5.28)
RDW: 16.6 % — ABNORMAL HIGH (ref 11.7–15.4)
WBC: 2.4 x10E3/uL — CL (ref 3.4–10.8)

## 2023-11-29 NOTE — Telephone Encounter (Signed)
 Copied from CRM [id number]. Topic: Clinical - Lab/Test Results >> Nov 29, 2023 10:08 AM [name] [name] wrote: Reason for CRM: adv patient of her labs

## 2023-12-05 ENCOUNTER — Encounter: Payer: Self-pay | Admitting: Radiology

## 2023-12-09 DIAGNOSIS — E1169 Type 2 diabetes mellitus with other specified complication: Secondary | ICD-10-CM | POA: Diagnosis not present

## 2023-12-09 DIAGNOSIS — M9711XD Periprosthetic fracture around internal prosthetic right knee joint, subsequent encounter: Secondary | ICD-10-CM | POA: Diagnosis not present

## 2023-12-09 DIAGNOSIS — S82831D Other fracture of upper and lower end of right fibula, subsequent encounter for closed fracture with routine healing: Secondary | ICD-10-CM | POA: Diagnosis not present

## 2023-12-09 DIAGNOSIS — E1142 Type 2 diabetes mellitus with diabetic polyneuropathy: Secondary | ICD-10-CM | POA: Diagnosis not present

## 2023-12-09 DIAGNOSIS — M9701XD Periprosthetic fracture around internal prosthetic right hip joint, subsequent encounter: Secondary | ICD-10-CM | POA: Diagnosis not present

## 2023-12-09 DIAGNOSIS — S72491D Other fracture of lower end of right femur, subsequent encounter for closed fracture with routine healing: Secondary | ICD-10-CM | POA: Diagnosis not present

## 2023-12-14 ENCOUNTER — Other Ambulatory Visit: Payer: Self-pay | Admitting: Radiology

## 2023-12-14 ENCOUNTER — Encounter: Payer: Self-pay | Admitting: Physician Assistant

## 2023-12-14 ENCOUNTER — Other Ambulatory Visit (INDEPENDENT_AMBULATORY_CARE_PROVIDER_SITE_OTHER)

## 2023-12-14 ENCOUNTER — Ambulatory Visit (INDEPENDENT_AMBULATORY_CARE_PROVIDER_SITE_OTHER): Admitting: Physician Assistant

## 2023-12-14 DIAGNOSIS — Z8781 Personal history of (healed) traumatic fracture: Secondary | ICD-10-CM | POA: Diagnosis not present

## 2023-12-14 DIAGNOSIS — Z9889 Other specified postprocedural states: Secondary | ICD-10-CM
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
## 2023-12-15 DIAGNOSIS — M9701XD Periprosthetic fracture around internal prosthetic right hip joint, subsequent encounter: Secondary | ICD-10-CM | POA: Diagnosis not present

## 2023-12-15 DIAGNOSIS — S82831D Other fracture of upper and lower end of right fibula, subsequent encounter for closed fracture with routine healing: Secondary | ICD-10-CM | POA: Diagnosis not present

## 2023-12-15 DIAGNOSIS — E1142 Type 2 diabetes mellitus with diabetic polyneuropathy: Secondary | ICD-10-CM | POA: Diagnosis not present

## 2023-12-15 DIAGNOSIS — S72491D Other fracture of lower end of right femur, subsequent encounter for closed fracture with routine healing: Secondary | ICD-10-CM | POA: Diagnosis not present

## 2023-12-15 DIAGNOSIS — M9711XD Periprosthetic fracture around internal prosthetic right knee joint, subsequent encounter: Secondary | ICD-10-CM | POA: Diagnosis not present

## 2023-12-15 DIAGNOSIS — E1169 Type 2 diabetes mellitus with other specified complication: Secondary | ICD-10-CM | POA: Diagnosis not present

## 2023-12-20 ENCOUNTER — Ambulatory Visit: Admitting: Orthopaedic Surgery

## 2023-12-20 DIAGNOSIS — Z9889 Other specified postprocedural states: Secondary | ICD-10-CM

## 2023-12-20 DIAGNOSIS — Z8781 Personal history of (healed) traumatic fracture: Secondary | ICD-10-CM | POA: Diagnosis not present
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
## 2023-12-21 DIAGNOSIS — E1169 Type 2 diabetes mellitus with other specified complication: Secondary | ICD-10-CM | POA: Diagnosis not present

## 2023-12-21 DIAGNOSIS — E1142 Type 2 diabetes mellitus with diabetic polyneuropathy: Secondary | ICD-10-CM | POA: Diagnosis not present

## 2023-12-21 DIAGNOSIS — K7581 Nonalcoholic steatohepatitis (NASH): Secondary | ICD-10-CM | POA: Diagnosis not present

## 2023-12-21 DIAGNOSIS — M9701XD Periprosthetic fracture around internal prosthetic right hip joint, subsequent encounter: Secondary | ICD-10-CM | POA: Diagnosis not present

## 2023-12-21 DIAGNOSIS — S82831D Other fracture of upper and lower end of right fibula, subsequent encounter for closed fracture with routine healing: Secondary | ICD-10-CM | POA: Diagnosis not present

## 2023-12-21 DIAGNOSIS — E559 Vitamin D deficiency, unspecified: Secondary | ICD-10-CM | POA: Diagnosis not present

## 2023-12-21 DIAGNOSIS — M9711XD Periprosthetic fracture around internal prosthetic right knee joint, subsequent encounter: Secondary | ICD-10-CM | POA: Diagnosis not present

## 2023-12-21 DIAGNOSIS — E785 Hyperlipidemia, unspecified: Secondary | ICD-10-CM | POA: Diagnosis not present

## 2023-12-21 DIAGNOSIS — E1143 Type 2 diabetes mellitus with diabetic autonomic (poly)neuropathy: Secondary | ICD-10-CM | POA: Diagnosis not present

## 2023-12-21 DIAGNOSIS — S72491D Other fracture of lower end of right femur, subsequent encounter for closed fracture with routine healing: Secondary | ICD-10-CM | POA: Diagnosis not present

## 2023-12-21 DIAGNOSIS — E039 Hypothyroidism, unspecified: Secondary | ICD-10-CM | POA: Diagnosis not present

## 2023-12-21 DIAGNOSIS — G4733 Obstructive sleep apnea (adult) (pediatric): Secondary | ICD-10-CM | POA: Diagnosis not present

## 2023-12-21 DIAGNOSIS — E118 Type 2 diabetes mellitus with unspecified complications: Secondary | ICD-10-CM | POA: Diagnosis not present

## 2023-12-22 ENCOUNTER — Encounter: Admitting: Orthopaedic Surgery

## 2023-12-26 ENCOUNTER — Encounter (INDEPENDENT_AMBULATORY_CARE_PROVIDER_SITE_OTHER): Payer: Self-pay | Admitting: Gastroenterology

## 2024-01-04 NOTE — Progress Notes (Signed)
 Chart was reviewed and during the patient's ED stay there was severe hypotension and BNP was ordered to make sure there was not heart failure contributed to this hypotension that would change how to best treat the blood pressure with fluids versus pressors.

## 2024-01-09 ENCOUNTER — Ambulatory Visit
Admission: RE | Admit: 2024-01-09 | Discharge: 2024-01-09 | Disposition: A | Source: Ambulatory Visit | Attending: Orthopaedic Surgery | Admitting: Orthopaedic Surgery

## 2024-01-09 DIAGNOSIS — Z471 Aftercare following joint replacement surgery: Secondary | ICD-10-CM | POA: Diagnosis not present

## 2024-01-09 DIAGNOSIS — Z96651 Presence of right artificial knee joint: Secondary | ICD-10-CM | POA: Diagnosis not present

## 2024-01-09 DIAGNOSIS — Z9889 Other specified postprocedural states: Secondary | ICD-10-CM

## 2024-01-10 ENCOUNTER — Ambulatory Visit: Payer: Self-pay | Admitting: Orthopaedic Surgery

## 2024-01-10 NOTE — Progress Notes (Signed)
 Needs f/u to review CT.  Not urgent.  Thanks.

## 2024-01-11 ENCOUNTER — Telehealth: Payer: Self-pay

## 2024-01-11 NOTE — Progress Notes (Signed)
 Spoke with her and reviewed findings.  Overall, she's doing better.  I recommend continuing with PT and bone stim.  Alternative is a hardware removal and DFR which is a large undertaking and both agreed not a great option at this time.  Please cancel her appointment tomorrow and reschedule for early feb to get new xrays.  Thanks.

## 2024-01-11 NOTE — Telephone Encounter (Signed)
 Tried to call. No answer. Cancelled appt for tomorrow per Dr.Xu. Ask her to call us  back so that we can get her rescheduled for early February with Dr.Xu and new x-rays at that time.

## 2024-01-12 ENCOUNTER — Ambulatory Visit: Admitting: Orthopaedic Surgery

## 2024-01-16 ENCOUNTER — Other Ambulatory Visit: Payer: Self-pay | Admitting: Hematology and Oncology

## 2024-01-16 ENCOUNTER — Telehealth: Payer: Self-pay | Admitting: Internal Medicine

## 2024-01-16 ENCOUNTER — Other Ambulatory Visit: Payer: Self-pay | Admitting: Internal Medicine

## 2024-01-16 DIAGNOSIS — J342 Deviated nasal septum: Secondary | ICD-10-CM

## 2024-01-16 DIAGNOSIS — J343 Hypertrophy of nasal turbinates: Secondary | ICD-10-CM

## 2024-01-16 NOTE — Telephone Encounter (Signed)
 Please advise already seen Dr Tobie and per patient he is aware of this issue need referral to ENT.  Copied from CRM #[id number]. Topic: Referral - Request for Referral >> Jan 16, 2024  1:06 PM Lonell PEDLAR wrote: Did the patient discuss referral with their provider in the last year? No, however, Dr. Tobie is aware of this issue that pt is having  (If No - schedule appointment) (If Yes - send message)  Appointment offered? No, apt on 12/29  Type of order/referral and detailed reason for visit: ENT, reoccurring nosebleeds, EMS presented to patient's house on Saturday  Preference of office, provider, location: [facility], [address] on [address]   If referral order, have you been seen by this specialty before? Yes, awhile ago, Dr. Delon (If Yes, this issue or another issue? When? Where?  Can we respond through MyChart? No

## 2024-01-17 ENCOUNTER — Ambulatory Visit: Payer: Self-pay

## 2024-01-17 NOTE — Telephone Encounter (Signed)
 Unable to leave vm.

## 2024-01-17 NOTE — Telephone Encounter (Signed)
 Left detailed vm informing pt of the message from pcp.

## 2024-01-17 NOTE — Telephone Encounter (Signed)
 Pt reports she has not ran out of either medications , taking as prescribed.

## 2024-01-17 NOTE — Telephone Encounter (Signed)
 FYI Only or Action Required?: Action required by provider: clinical question for provider and update on patient condition.  Patient was last seen in primary care on 11/28/2023 by Tobie [name], MD.  Called Nurse Triage reporting Hypertension.  Symptoms began a week ago.  Interventions attempted: OTC medications: tylenol  and Prescription medications: HTN medication; unsure of name, states 9.5mg .  Symptoms are: unchanged.  Triage Disposition: See PCP Within 2 Weeks  Patient/caregiver understands and will follow disposition?:   Copied from CRM [id number]. Topic: Clinical - Red Word Triage >> Jan 17, 2024  9:22 AM Tonda B wrote: [facility] that prompted transfer to Nurse Triage: patient  is calling in with high blood pressure up and down for several days 156/74 Reason for Disposition  [1] Systolic BP >= 130 OR Diastolic >= 80 AND [2] taking BP medications  Answer Assessment - Initial Assessment Questions Pt called in stating she has had elevated BP for a little over the last week with intermittent symptoms of h/a, relieved by tylenol  and nose bleeds; pt called EMS Saturday, 12/13, due to not being able to get bleeding to stop. At that time BP was 175/85 per EMS. Pt cleared by EMS at home, did not go to ED. Was told to f/u with PCP in the next week. Pt has appt 12/29 and no sooner appts. Discussed importance of continuing to monitor. Discussed red flag symptoms of chest pain, difficulty breathing, numbness. Pt states she wasn't sure if she would need referral to ENT d/t epistaxis. Discussed I would get info over to PCP and office would be in touch. She voiced appreciation.   1. BLOOD PRESSURE: What is your blood pressure? Did you take at least two measurements 5 minutes apart?     153/70 today; states that she takes it throughout the day when high or symptomatic (h/a, epistaxis)  2. ONSET: When did you take your blood pressure?     X1 week  3. HOW: How did you take your blood  pressure? (e.g., automatic home BP monitor, visiting nurse)     Automatic home monitor  4. HISTORY: Do you have a history of high blood pressure?     yes  5. MEDICINES: Are you taking any medicines for blood pressure? Have you missed any doses recently?     Yes takes medication daily; has not missed any doses   6. OTHER SYMPTOMS: Do you have any symptoms? (e.g., blurred vision, chest pain, difficulty breathing, headache, weakness)     H/a; nosebleeds      Reports Saturday, 12/13 EMS came to home to aid with nose bleed and had vision changes but was cleared by EMS after nose bleed stopped. EMS monitored BP during tx  Protocols used: Blood Pressure - High-A-AH

## 2024-01-20 ENCOUNTER — Other Ambulatory Visit: Payer: Self-pay | Admitting: Internal Medicine

## 2024-01-20 ENCOUNTER — Telehealth: Payer: Self-pay

## 2024-01-20 DIAGNOSIS — F411 Generalized anxiety disorder: Secondary | ICD-10-CM

## 2024-01-20 NOTE — Telephone Encounter (Signed)
 Triage call (message on vm)  Cherene, physical therapist with [facility] did a reassessment today. Patient says that per provider, she is to do no active abduction. Is this correct, and are there any other restrictions? She asks for a call back at #[phone]. Unsure if she is supposed to be going back over the weekend for [email or web address]

## 2024-01-21 NOTE — Telephone Encounter (Signed)
 Any restrictions for this?

## 2024-01-21 NOTE — Telephone Encounter (Signed)
She has no restrictions

## 2024-01-23 NOTE — Telephone Encounter (Signed)
 Called and advised Cherene with [facility].

## 2024-01-30 ENCOUNTER — Ambulatory Visit (INDEPENDENT_AMBULATORY_CARE_PROVIDER_SITE_OTHER): Admitting: Internal Medicine

## 2024-01-30 ENCOUNTER — Encounter: Payer: Self-pay | Admitting: Internal Medicine

## 2024-01-30 ENCOUNTER — Other Ambulatory Visit: Payer: Self-pay | Admitting: Hematology and Oncology

## 2024-01-30 VITALS — BP 136/86 | HR 82 | Ht 61.0 in

## 2024-01-30 DIAGNOSIS — Z6841 Body Mass Index (BMI) 40.0 and over, adult: Secondary | ICD-10-CM | POA: Diagnosis not present

## 2024-01-30 DIAGNOSIS — D849 Immunodeficiency, unspecified: Secondary | ICD-10-CM

## 2024-01-30 DIAGNOSIS — E66813 Obesity, class 3: Secondary | ICD-10-CM | POA: Diagnosis not present

## 2024-01-30 DIAGNOSIS — F411 Generalized anxiety disorder: Secondary | ICD-10-CM | POA: Diagnosis not present

## 2024-01-30 DIAGNOSIS — E1140 Type 2 diabetes mellitus with diabetic neuropathy, unspecified: Secondary | ICD-10-CM | POA: Diagnosis not present

## 2024-01-30 DIAGNOSIS — E785 Hyperlipidemia, unspecified: Secondary | ICD-10-CM | POA: Diagnosis not present

## 2024-01-30 DIAGNOSIS — F331 Major depressive disorder, recurrent, moderate: Secondary | ICD-10-CM

## 2024-01-30 DIAGNOSIS — R040 Epistaxis: Secondary | ICD-10-CM | POA: Diagnosis not present

## 2024-01-30 DIAGNOSIS — R591 Generalized enlarged lymph nodes: Secondary | ICD-10-CM

## 2024-01-30 DIAGNOSIS — I10 Essential (primary) hypertension: Secondary | ICD-10-CM | POA: Diagnosis not present

## 2024-01-30 DIAGNOSIS — R590 Localized enlarged lymph nodes: Secondary | ICD-10-CM | POA: Diagnosis not present

## 2024-01-30 DIAGNOSIS — E1169 Type 2 diabetes mellitus with other specified complication: Secondary | ICD-10-CM | POA: Diagnosis not present

## 2024-01-30 DIAGNOSIS — Z170 Estrogen receptor positive status [ER+]: Secondary | ICD-10-CM

## 2024-01-30 DIAGNOSIS — Z794 Long term (current) use of insulin: Secondary | ICD-10-CM

## 2024-01-30 DIAGNOSIS — E039 Hypothyroidism, unspecified: Secondary | ICD-10-CM

## 2024-01-30 MED ORDER — BUSPIRONE HCL 10 MG PO TABS: 10.0000 mg | ORAL_TABLET | Freq: Two times a day (BID) | ORAL | 3 refills | Status: AC

## 2024-01-30 NOTE — Assessment & Plan Note (Addendum)
On Levothyroxine 137 mcg QD Followed by Endocrinology 

## 2024-01-30 NOTE — Assessment & Plan Note (Signed)
 Lab Results  Component Value Date   HGBA1C 6.0 09/28/2023   Well-controlled On Lantus  and ISS - followed by Endocrinology On Ozempic  and Farxiga  Advised to follow diabetic diet On ARB Diabetic eye exam: Advised to follow up with Ophthalmology for diabetic eye exam

## 2024-01-30 NOTE — Progress Notes (Signed)
 "  Established Patient Office Visit  Subjective:  Patient ID: [name] [name], female    DOB: 01-13-1951  Age: 73 y.o. MRN: [id number]  CC:  Chief Complaint  Patient presents with   Hypertension    6 month f/u    Diabetes    6 month f/u    Eye Pain    Reports left eye pain since 01/26/24, feels as if something is in her eye.    HPI [name] [name] is a 73 y.o. female with past medical history of HTN, type II DM with neuropathy, OSA, hypothyroidism, OA of multiple joints, gout, MDD, breast cancer s/p lumpectomy and morbid obesity who presents for f/u of her chronic medical conditions.   HTN: Her BP is WNL in the office today.  She takes Coreg  3.125 mg BID and Losartan  50 mg QD.  She denies any headache, dizziness, chest pain, or palpitations currently.  Type II DM with HLD: Followed by endocrinology.  She is on Ozempic , Lantus  62 U QD and ISS.  She also takes Farxiga .  Her last HbA1C was 6.0 in 08/25. She denies any polyuria or polydipsia currently.  She takes gabapentin  for neuropathy, but still has severe burning pain of bilateral feet.  She also has chronic numbness of bilateral feet.  She used to follow-up with neurology, but has not visited them recently.  Diabetic neuropathy and recurrent falls: She has had recurrent falls due to neuropathy, OA of knee and visual disturbance.  She currently takes gabapentin  for neuropathy.  She is mostly wheelchair bound currently due to last fall, leading to fracture of right femur, which has delayed healing, followed by Orthopedic surgery. She is unable to use manual wheelchair by herself due to OA of hands as well.  She would benefit from electric scooter, which will improve her mobility and safety, but was only able to get electric wheelchair and she does not feel safe using it.  She has history of left breast cancer, s/p lumpectomy.  She is on Aromasin  for ER/PR positive breast cancer and follows up with Oncology. She has completed radiation for recurrent  breast cancer.  She has noticed lumps in the right axillary area for the last 3 months.  GAD and insomnia: She currently takes Wellbutrin  150 mg QD.  She has also been taking BuSpar  7.5 mg twice daily, has noticed some improvement, but still reports episodes of anxiety.  She reports difficulty initiating and maintaining sleep.  She has felt worse since her last fall.  She admits that pain also interferes with her sleep.  She reports left eye pain and redness for the last 5 days.  She reports a scratchy sensation in the left eye.  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  Past Medical History:  Diagnosis Date   Abdominal wall contusion 12/09/2011   Achilles tendon contracture, bilateral 06/03/2016   Anxiety    Arthritis    Blood dyscrasia    low platelet count- sees [name] , Dr. Eleanor Lily ( Note in EPIC from 07/2014) at [facility]   Breast cancer [facility]) 2017   Left Breast   Breast cancer (HCC) 07/2021   right breast IDC   Cancer (HCC)    left breast   Chest wall contusion 12/09/2011   Cirrhosis of liver (HCC)    Cough    Diabetes mellitus without complication (HCC)    Diabetic neuropathy (HCC)    Diarrhea due  to drug 01/29/2021   Dyspnea    Gout    Gout    History of radiation therapy 10/22/15 - 12/08/15   left breast 50.4 Gy, boost to 10 Gy   History of radiation therapy    Right breast- 09/22/21-10/30/21- Dr. Lynwood Nasuti   Hypertension    Hypothyroidism    Migraine    Multifactorial gait disorder 01/12/2013   Neuromuscular disorder (HCC)    diabetic neuropathy   Neuropathy    Personal history of radiation therapy 2017   Left Breast Cancer   Posterior tibial tendon dysfunction (PTTD) of right lower extremity 10/09/2019   Sleep apnea    CPAP nightly   Spleen enlarged    Thyroid  disease     Past Surgical History:  Procedure Laterality Date   ANKLE FUSION Right 2022    BIOPSY  10/19/2022   Procedure: BIOPSY;  Surgeon: Eartha Angelia Sieving, MD;  [facility]: [facility];  Service: Gastroenterology;;   BREAST BIOPSY Left 08/04/2015   Procedure: LEFT BREAST BIOPSY WITH NEEDLE LOCALIZATION;  Surgeon: Krystal Spinner, MD;  Location: [facility];  Service: General;  Laterality: Left;   BREAST BIOPSY Right 06/23/2021   BREAST DUCTAL SYSTEM EXCISION Left 08/04/2015   Procedure: LEFT EXCISION DUCTAL SYSTEM BREAST;  Surgeon: Krystal Spinner, MD;  Location: [facility];  Service: General;  Laterality: Left;   BREAST LUMPECTOMY Left 08/2015   BREAST LUMPECTOMY Right 08/2021   BREAST LUMPECTOMY WITH AXILLARY LYMPH NODE BIOPSY Left 09/12/2015   Procedure: RE-EXCISION OF LEFT BREAST LUMPECTOMY WITH LEFT AXILLARY LYMPH NODE BIOPSY;  Surgeon: Krystal Spinner, MD;  Location: [facility];  Service: General;  Laterality: Left;   BREAST LUMPECTOMY WITH RADIOACTIVE SEED LOCALIZATION Right 08/12/2021   Procedure: RIGHT BREAST LUMPECTOMY WITH RADIOACTIVE SEED LOCALIZATION;  Surgeon: Vernetta Berg, MD;  Location: [facility];  Service: General;  Laterality: Right;  LMA   CHOLECYSTECTOMY     ESOPHAGOGASTRODUODENOSCOPY (EGD) WITH PROPOFOL  N/A 10/19/2022   Procedure: ESOPHAGOGASTRODUODENOSCOPY (EGD) WITH PROPOFOL ;  Surgeon: Eartha Angelia Sieving, MD;  Location: [facility];  Service: Gastroenterology;  Laterality: N/A;  10:30am asa 3   EYE SURGERY Bilateral 2022   Lashes growing backwards into the eye   JOINT REPLACEMENT     left knee   JOINT REPLACEMENT  2017   right knee   ORIF FEMUR FRACTURE Right 05/02/2023   Procedure: OPEN REDUCTION INTERNAL FIXATION (ORIF) DISTAL FEMUR FRACTURE;  Surgeon: Genelle Standing, MD;  Location: [facility];  Service: Orthopedics;  Laterality: Right;   REVERSE SHOULDER ARTHROPLASTY Right 09/07/2018   Procedure: RIGHT REVERSE SHOULDER ARTHROPLASTY;  Surgeon: Addie Cordella Hamilton, MD;  Location: [facility];   Service: Orthopedics;  Laterality: Right;   TOTAL KNEE ARTHROPLASTY Right 02/28/2015   Procedure: RIGHT TOTAL KNEE ARTHROPLASTY;  Surgeon: Lonni CINDERELLA Vernetta, MD;  Location: [facility];  Service: Orthopedics;  Laterality: Right;   TUBAL LIGATION      Family History  Problem Relation Age of Onset   Hypothyroidism Mother    Alzheimer's disease Mother    Diabetes Father    Cancer Sister        GYN cancer   Hypothyroidism Sister    Heart disease Brother    Arthritis Son    Diabetes Son    Arthritis Son    Cancer Maternal Grandmother    Heart failure Maternal Grandfather    Bone cancer Paternal Grandmother    Heart failure Paternal Grandmother  Social History   Socioeconomic History   Marital status: Married    Spouse name: Dallas Rogowski   Number of children: 3   Years of education: 12th   Highest education level: 12th grade  Occupational History   Occupation: Retired    Comment: Dance Movement Psychotherapist  Tobacco Use   Smoking status: Never    Passive exposure: Never   Smokeless tobacco: Never  Vaping Use   Vaping status: Never Used  Substance and Sexual Activity   Alcohol use: No   Drug use: No   Sexual activity: Yes    Partners: Male    Birth control/protection: Surgical    Comment: BTL  Other Topics Concern   Not on file  Social History Narrative   Pt lives with family    Pt works    Social Drivers of Health   Tobacco Use: Low Risk (01/30/2024)   Patient History    Smoking Tobacco Use: Never    Smokeless Tobacco Use: Never    Passive Exposure: Never  Financial Resource Strain: Low Risk (03/16/2023)   Overall Financial Resource Strain (CARDIA)    Difficulty of Paying Living Expenses: Not hard at all  Food Insecurity: No Food Insecurity (05/01/2023)   Hunger Vital Sign    Worried About Running Out of Food in the Last Year: Never true    Ran Out of Food in the Last Year: Never true  Transportation Needs: No Transportation Needs (05/01/2023)   PRAPARE -  Administrator, Civil Service (Medical): No    Lack of Transportation (Non-Medical): No  Physical Activity: Sufficiently Active (03/16/2023)   Exercise Vital Sign    Days of Exercise per Week: 5 days    Minutes of Exercise per Session: 50 min  Stress: No Stress Concern Present (03/16/2023)   [employer] of Occupational Health - Occupational Stress Questionnaire    Feeling of Stress : Only a little  Social Connections: Moderately Integrated (05/01/2023)   Social Connection and Isolation Panel    Frequency of Communication with Friends and Family: More than three times a week    Frequency of Social Gatherings with Friends and Family: More than three times a week    Attends Religious Services: Never    Database Administrator or Organizations: Yes    Attends Engineer, Structural: More than 4 times per year    Marital Status: Married  Catering Manager Violence: Not At Risk (05/01/2023)   Humiliation, Afraid, Rape, and Kick questionnaire    Fear of Current or Ex-Partner: No    Emotionally Abused: No    Physically Abused: No    Sexually Abused: No  Depression (PHQ2-9): High Risk (01/30/2024)   Depression (PHQ2-9)    PHQ-2 Score: 19  Alcohol Screen: Low Risk (03/16/2023)   Alcohol Screen    Last Alcohol Screening Score (AUDIT): 0  Housing: Low Risk (05/01/2023)   Housing Stability Vital Sign    Unable to Pay for Housing in the Last Year: No    Number of Times Moved in the Last Year: 0    Homeless in the Last Year: No  Utilities: Not At Risk (05/01/2023)   AHC Utilities    Threatened with loss of utilities: No  Health Literacy: Adequate Health Literacy (03/16/2023)   B1300 Health Literacy    Frequency of need for help with medical instructions: Never    Outpatient Medications Prior to Visit  Medication Sig Dispense Refill   ACCU-CHEK AVIVA PLUS test strip  USE AS INSTRUCTED TO CHECK BLOOD SUGAR 2 TIMES DAILY 100 strip 5   acetaminophen  (TYLENOL ) 500 MG tablet  Take 500 mg by mouth every 8 (eight) hours as needed for moderate pain (pain score 4-6).     albuterol  (VENTOLIN  HFA) 108 (90 Base) MCG/ACT inhaler Inhale 1-2 puffs into the lungs every 4 (four) hours as needed for wheezing or shortness of breath. 18 g 2   Ascorbic Acid (VITAMIN C PO) Take 1 tablet by mouth daily.     azelastine  (ASTELIN ) 0.1 % nasal spray Place 2 sprays into both nostrils 2 (two) times daily. Use in each nostril as directed 30 mL 12   buPROPion  (WELLBUTRIN  XL) 150 MG 24 hr tablet TAKE 1 TABLET BY MOUTH EVERY DAY IN THE MORNING 90 tablet 1   calcium  carbonate (OS-CAL - DOSED IN MG OF ELEMENTAL CALCIUM ) 1250 (500 Ca) MG tablet Take 1 tablet by mouth daily with breakfast.     carvedilol  (COREG ) 6.25 MG tablet Take 1 tablet (6.25 mg total) by mouth 2 (two) times daily with a meal.     Continuous Glucose Receiver (DEXCOM G7 RECEIVER) DEVI Use receiver for continuous glucose monitoring daily daily; Duration: 365 days     Continuous Glucose Sensor (DEXCOM G7 SENSOR) MISC Use 1 sensor for continuous glucose monitoring every 10 days; Duration: 30 days     Cyanocobalamin  (VITAMIN B-12 PO) Take 1 tablet by mouth daily.     dapagliflozin  propanediol (FARXIGA ) 5 MG TABS tablet Take 5 mg by mouth daily.     exemestane  (AROMASIN ) 25 MG tablet TAKE 1 TABLET (25 MG TOTAL) BY MOUTH DAILY AFTER BREAKFAST. 90 tablet 1   FIASP  FLEXTOUCH 100 UNIT/ML FlexTouch Pen Inject 22 Units into the skin 3 (three) times daily after meals.     fluticasone (FLONASE) 50 MCG/ACT nasal spray Place 1 spray into both nostrils daily as needed for allergies.     gabapentin  (NEURONTIN ) 600 MG tablet TAKE 1 TABLET IN THE MORNING AND 1 TABLET IN THE AFTERNOON AND 2TABS AT BEDTIME     Immune Globulin 10% (PRIVIGEN ) 20 GM/200ML SOLN 500 mL for 4 days Intravenous every 3 weeks     insulin  glargine (LANTUS  SOLOSTAR) 100 UNIT/ML Solostar Pen Inject 62 Units into the skin daily. 30 mL 0   Insulin  Pen Needle (B-D UF III MINI PEN  NEEDLES) 31G X 5 MM MISC USE AS DIRECTED WITH INSULIN  EVERY MORNING, NOON, EVENING AND AT BEDTIME 400 each 3   Insulin  Pen Needle 30G X 5 MM MISC 1 Device by Does not apply route in the morning, at noon, in the evening, and at bedtime. 400 each 3   ketoconazole  (NIZORAL ) 2 % cream Apply 1 Application topically daily. 60 g 0   levothyroxine  (SYNTHROID ) 137 MCG tablet Take 137 mcg by mouth daily.     losartan  (COZAAR ) 50 MG tablet Take 1 tablet (50 mg total) by mouth daily.     meclizine  (ANTIVERT ) 25 MG tablet Take 1 tablet (25 mg total) by mouth 3 (three) times daily as needed for dizziness. 30 tablet 0   methocarbamol  (ROBAXIN ) 500 MG tablet Take 1 tablet (500 mg total) by mouth every 8 (eight) hours as needed for muscle spasms. 30 tablet 0   mirabegron  ER (MYRBETRIQ ) 25 MG TB24 tablet Take 1 tablet (25 mg total) by mouth daily. 30 tablet 11   Misc. Devices MISC Blood pressure cuff/device - 1. ICD10: I10 1 each 0   Multiple Vitamins-Minerals (MULTIVITAMIN WITH MINERALS)  tablet Take 1 tablet by mouth daily.     mycophenolate  (CELLCEPT ) 500 MG tablet Take 1,500 mg by mouth 2 (two) times daily.     omeprazole  (PRILOSEC) 20 MG capsule TAKE 1 CAPSULE BY MOUTH EVERY DAY 90 capsule 3   ondansetron  (ZOFRAN ) 4 MG tablet Take 1 tablet (4 mg total) by mouth every 8 (eight) hours as needed for nausea or vomiting. 90 tablet 1   Pyridoxine HCl (VITAMIN B-6 PO) Take 1 tablet by mouth daily.     Semaglutide , 1 MG/DOSE, (OZEMPIC , 1 MG/DOSE,) 4 MG/3ML SOPN Inject 1 mg into the skin once a week. 3 mL 5   sucralfate  (CARAFATE ) 1 g tablet Take 1 tablet (1 g total) by mouth 4 (four) times daily -  with meals and at bedtime for 7 days. (Patient taking differently: Take 1 g by mouth 4 (four) times daily -  with meals and at bedtime. As needed.) 28 tablet 0   traMADol  (ULTRAM ) 50 MG tablet Take 1-2 tablets (50-100 mg total) by mouth every 6 (six) hours as needed. 30 tablet 0   traZODone  (DESYREL ) 50 MG tablet TAKE 0.5-1  TABLETS BY MOUTH AT BEDTIME AS NEEDED FOR SLEEP. 30 tablet 3   VITAMIN D PO Take 1 tablet by mouth daily.     amoxicillin -clavulanate (AUGMENTIN ) 875-125 MG tablet Take 1 tablet by mouth 2 (two) times daily. 14 tablet 0   busPIRone  (BUSPAR ) 7.5 MG tablet TAKE 1 TABLET BY MOUTH 2 TIMES DAILY. 60 tablet 3   FARXIGA  10 MG TABS tablet TAKE 1 TABLET BY MOUTH DAILY BEFORE BREAKFAST. (Patient taking differently: Take 5 mg by mouth daily before breakfast.) 90 tablet 3   cetirizine  (ZYRTEC  ALLERGY) 10 MG tablet Take 1 tablet (10 mg total) by mouth daily for 5 days. 5 tablet 0   No facility-administered medications prior to visit.    Allergies  Allergen Reactions   Codeine  Hives, Itching and Swelling   Hydrocodone -Acetaminophen  Shortness Of Breath, Itching, Nausea And Vomiting and Swelling   Influenza A (H1n1) Monoval Pf Anaphylaxis   Lisinopril  Cough   Metformin Diarrhea   Pregabalin     Unknown reaction   Jardiance [Empagliflozin] Other (See Comments)    dehydration    ROS Review of Systems  Constitutional:  Positive for fatigue. Negative for chills and fever.  HENT:  Positive for nosebleeds. Negative for congestion, sinus pressure and sinus pain.   Eyes:  Positive for pain, discharge, redness and itching.  Respiratory:  Negative for cough and wheezing.   Cardiovascular:  Negative for chest pain and palpitations.  Gastrointestinal:  Negative for diarrhea, nausea and vomiting.  Genitourinary:  Negative for dysuria and hematuria.  Musculoskeletal:  Positive for arthralgias, back pain, gait problem and neck pain.  Skin:  Negative for rash.  Neurological:  Positive for weakness and numbness.  Psychiatric/Behavioral:  Positive for sleep disturbance. Negative for agitation and behavioral problems. The patient is nervous/anxious.       Objective:    Physical Exam Vitals reviewed.  Constitutional:      General: She is not in acute distress.    Appearance: She is obese. She is not  diaphoretic.     Comments: In wheelchair  HENT:     Head: Normocephalic and atraumatic.     Nose: No congestion.     Mouth/Throat:     Mouth: Mucous membranes are moist.  Eyes:     General: No scleral icterus.    Extraocular Movements: Extraocular movements intact.  Cardiovascular:     Rate and Rhythm: Normal rate and regular rhythm.     Heart sounds: Normal heart sounds. No murmur heard. Pulmonary:     Breath sounds: Normal breath sounds. No wheezing or rales.  Musculoskeletal:     Left shoulder: Tenderness present. Decreased range of motion. Decreased strength.     Cervical back: Neck supple. No tenderness.     Right lower leg: No edema.     Left lower leg: No edema.  Lymphadenopathy:     Upper Body:     Right upper body: Axillary adenopathy present.  Skin:    General: Skin is warm.     Findings: No rash.  Neurological:     General: No focal deficit present.     Mental Status: She is alert and oriented to person, place, and time.     Sensory: Sensory deficit present.     Motor: Weakness (3/5 in b/l LE, 4/5 in b/l UE) present.     Gait: Gait abnormal.  Psychiatric:        Mood and Affect: Mood is depressed.        Behavior: Behavior normal.     BP 136/86 (BP Location: Left Arm)   Pulse 82   Ht 5' 1 (1.549 m)   SpO2 97%   BMI 45.36 kg/m  Wt Readings from Last 3 Encounters:  11/23/23 240 lb 1.3 oz (108.9 kg)  10/19/23 240 lb (108.9 kg)  09/26/23 240 lb (108.9 kg)    Lab Results  Component Value Date   TSH 4.28 12/14/2021   Lab Results  Component Value Date   WBC 2.4 (LL) 11/28/2023   HGB 12.1 11/28/2023   HCT 38.4 11/28/2023   MCV 94 11/28/2023   PLT 62 (LL) 11/28/2023   Lab Results  Component Value Date   NA 136 11/24/2023   K 3.6 11/24/2023   CHLORIDE 104 10/06/2016   CO2 21 (L) 11/24/2023   GLUCOSE 105 (H) 11/24/2023   BUN 13 11/24/2023   CREATININE 0.92 11/24/2023   BILITOT 1.4 (H) 11/24/2023   ALKPHOS 83 11/24/2023   AST 38 11/24/2023    ALT 18 11/24/2023   PROT 6.8 11/24/2023   ALBUMIN  2.7 (L) 11/24/2023   CALCIUM  8.9 11/24/2023   ANIONGAP 7 11/24/2023   EGFR 61 09/26/2023   GFR 50.27 (L) 08/24/2019   Lab Results  Component Value Date   CHOL 171 06/09/2021   Lab Results  Component Value Date   HDL 63.30 06/09/2021   Lab Results  Component Value Date   LDLCALC 80 06/09/2021   Lab Results  Component Value Date   TRIG 137.0 06/09/2021   Lab Results  Component Value Date   CHOLHDL 3 06/09/2021   Lab Results  Component Value Date   HGBA1C 6.0 09/28/2023      Assessment & Plan:   Problem List Items Addressed This Visit       Cardiovascular and Mediastinum   Essential hypertension   BP Readings from Last 1 Encounters:  01/30/24 136/86   Well-controlled with Coreg  6.25 mg BID and Losartan  50 mg QD Counseled for compliance with the medications Advised DASH diet and moderate exercise/walking as tolerated      Epistaxis, recurrent   Had sent a referral to [facility], information provided Use Flonase or azelastine  for allergic sinusitis        Endocrine   Type 2 diabetes mellitus with hyperlipidemia (HCC)   Lab Results  Component Value  Date   HGBA1C 6.0 09/28/2023   Well-controlled On Lantus  and ISS - followed by Endocrinology On Ozempic  and Farxiga  Advised to follow diabetic diet On ARB Diabetic eye exam: Advised to follow up with Ophthalmology for diabetic eye exam      Relevant Medications   dapagliflozin  propanediol (FARXIGA ) 5 MG TABS tablet   Acquired hypothyroidism   On Levothyroxine  137 mcg QD Followed by Endocrinology        Immune and Lymphatic   LAD (lymphadenopathy), axillary   Has slight tenderness, but considering her history of breast carcinoma - obtain US  of axilla Recent diagnostic mammography was benign in 09/25      Relevant Orders   US  AXILLA RIGHT     Other   Moderate recurrent major depression (HCC)   Uncontrolled with Wellbutrin  150 mg  QD Has trazodone  as needed for insomnia BuSpar  for GAD, increased dose to 10 mg BID Likely worse recently due to her other chronic medical conditions      Relevant Medications   busPIRone  (BUSPAR ) 10 MG tablet   GAD (generalized anxiety disorder) - Primary   Uncontrolled On Wellbutrin  150 mg QD Increased dose of BuSpar  to 10 mg BID      Relevant Medications   busPIRone  (BUSPAR ) 10 MG tablet   Immunosuppressed status   She takes CellCept  for history of ocular cicatricial pemphigoid          Meds ordered this encounter  Medications   busPIRone  (BUSPAR ) 10 MG tablet    Sig: Take 1 tablet (10 mg total) by mouth 2 (two) times daily.    Dispense:  60 tablet    Refill:  3    Dose change - 01/30/24    Follow-up: Return in about 4 months (around 05/30/2024) for GAD and HTN.    Suzzane MARLA Blanch, MD "

## 2024-01-30 NOTE — Patient Instructions (Addendum)
 Please schedule Medicare Annual Wellness.  Please start taking Buspirone  10 mg twice daily.  Please schedule appointment with Dr Maree for eye pain.  Please call Dr Jesus - [phone] to schedule appointment.  Please continue to take other medications as prescribed.  Please continue to follow low carb diet and ambulate as tolerated.

## 2024-01-30 NOTE — Assessment & Plan Note (Signed)
 She takes CellCept  for history of ocular cicatricial pemphigoid

## 2024-01-30 NOTE — Assessment & Plan Note (Addendum)
 BP Readings from Last 1 Encounters:  01/30/24 136/86   Well-controlled with Coreg  6.25 mg BID and Losartan  50 mg QD Counseled for compliance with the medications Advised DASH diet and moderate exercise/walking as tolerated

## 2024-01-30 NOTE — Assessment & Plan Note (Signed)
 Uncontrolled On Wellbutrin  150 mg QD Increased dose of BuSpar  to 10 mg BID

## 2024-01-30 NOTE — Assessment & Plan Note (Signed)
 Has slight tenderness, but considering her history of breast carcinoma - obtain US  of axilla Recent diagnostic mammography was benign in 09/25

## 2024-01-30 NOTE — Assessment & Plan Note (Signed)
 Uncontrolled with Wellbutrin  150 mg QD Has trazodone  as needed for insomnia BuSpar  for GAD, increased dose to 10 mg BID Likely worse recently due to her other chronic medical conditions

## 2024-01-30 NOTE — Assessment & Plan Note (Signed)
 Had sent a referral to [facility], information provided Use Flonase or azelastine  for allergic sinusitis

## 2024-02-08 ENCOUNTER — Telehealth: Payer: Self-pay | Admitting: Orthopaedic Surgery

## 2024-02-08 NOTE — Telephone Encounter (Signed)
 Called and advised.

## 2024-02-08 NOTE — Telephone Encounter (Signed)
 Terri called. She is the [facility] working with patient. She would like to know the restrictions patient has.[phone]

## 2024-02-10 ENCOUNTER — Ambulatory Visit
Admission: RE | Admit: 2024-02-10 | Discharge: 2024-02-10 | Disposition: A | Source: Ambulatory Visit | Attending: Hematology and Oncology

## 2024-02-10 ENCOUNTER — Ambulatory Visit
Admission: RE | Admit: 2024-02-10 | Discharge: 2024-02-10 | Disposition: A | Source: Ambulatory Visit | Attending: Internal Medicine

## 2024-02-10 DIAGNOSIS — R591 Generalized enlarged lymph nodes: Secondary | ICD-10-CM

## 2024-03-05 ENCOUNTER — Emergency Department (HOSPITAL_COMMUNITY)

## 2024-03-05 ENCOUNTER — Other Ambulatory Visit: Payer: Self-pay

## 2024-03-05 ENCOUNTER — Encounter (HOSPITAL_COMMUNITY): Payer: Self-pay

## 2024-03-05 ENCOUNTER — Emergency Department (HOSPITAL_COMMUNITY)
Admission: EM | Admit: 2024-03-05 | Discharge: 2024-03-05 | Disposition: A | Discharge status: Home / Self Care | Attending: Emergency Medicine | Admitting: Emergency Medicine

## 2024-03-05 DIAGNOSIS — R112 Nausea with vomiting, unspecified: Secondary | ICD-10-CM | POA: Insufficient documentation

## 2024-03-05 DIAGNOSIS — R1011 Right upper quadrant pain: Secondary | ICD-10-CM | POA: Insufficient documentation

## 2024-03-05 DIAGNOSIS — N3000 Acute cystitis without hematuria: Secondary | ICD-10-CM

## 2024-03-05 DIAGNOSIS — R1013 Epigastric pain: Secondary | ICD-10-CM | POA: Insufficient documentation

## 2024-03-05 DIAGNOSIS — R161 Splenomegaly, not elsewhere classified: Secondary | ICD-10-CM

## 2024-03-05 DIAGNOSIS — K7460 Unspecified cirrhosis of liver: Secondary | ICD-10-CM

## 2024-03-05 DIAGNOSIS — R509 Fever, unspecified: Secondary | ICD-10-CM | POA: Insufficient documentation

## 2024-03-05 DIAGNOSIS — Z794 Long term (current) use of insulin: Secondary | ICD-10-CM | POA: Insufficient documentation

## 2024-03-05 DIAGNOSIS — R1012 Left upper quadrant pain: Secondary | ICD-10-CM | POA: Insufficient documentation

## 2024-03-05 DIAGNOSIS — R059 Cough, unspecified: Secondary | ICD-10-CM | POA: Insufficient documentation

## 2024-03-05 LAB — RESP PANEL BY RT-PCR (RSV, FLU A&B, COVID)  RVPGX2
Influenza A by PCR: NEGATIVE
Influenza B by PCR: NEGATIVE
Resp Syncytial Virus by PCR: NEGATIVE
SARS Coronavirus 2 by RT PCR: NEGATIVE

## 2024-03-05 LAB — URINALYSIS, W/ REFLEX TO CULTURE (INFECTION SUSPECTED)
Bilirubin Urine: NEGATIVE
Glucose, UA: >=500 mg/dL — AB
Hgb urine dipstick: NEGATIVE
Ketones, ur: NEGATIVE mg/dL
Nitrite: POSITIVE — AB
Protein, ur: 100 mg/dL — AB
Specific Gravity, Urine: 1.016 (ref 1.005–1.030)
pH: 5 (ref 5.0–8.0)

## 2024-03-05 LAB — COMPREHENSIVE METABOLIC PANEL WITH GFR
ALT: 19 U/L (ref 0–44)
AST: 42 U/L — ABNORMAL HIGH (ref 15–41)
Albumin: 3.2 g/dL — ABNORMAL LOW (ref 3.5–5.0)
Alkaline Phosphatase: 111 U/L (ref 38–126)
Anion gap: 10 (ref 5–15)
BUN: 17 mg/dL (ref 8–23)
CO2: 20 mmol/L — ABNORMAL LOW (ref 22–32)
Calcium: 9.7 mg/dL (ref 8.9–10.3)
Chloride: 105 mmol/L (ref 98–111)
Creatinine, Ser: 0.91 mg/dL (ref 0.44–1.00)
GFR, Estimated: >60 mL/min
Glucose, Bld: 163 mg/dL — ABNORMAL HIGH (ref 70–99)
Potassium: 3.9 mmol/L (ref 3.5–5.1)
Sodium: 135 mmol/L (ref 135–145)
Total Bilirubin: 0.9 mg/dL (ref 0.0–1.2)
Total Protein: 6.9 g/dL (ref 6.5–8.1)

## 2024-03-05 LAB — CBC
HCT: 37.9 % (ref 36.0–46.0)
Hemoglobin: 12.4 g/dL (ref 12.0–15.0)
MCH: 30.8 pg (ref 26.0–34.0)
MCHC: 32.7 g/dL (ref 30.0–36.0)
MCV: 94 fL (ref 80.0–100.0)
Platelets: 40 10*3/uL — ABNORMAL LOW (ref 150–400)
RBC: 4.03 MIL/uL (ref 3.87–5.11)
RDW: 15.2 % (ref 11.5–15.5)
WBC: 2 10*3/uL — ABNORMAL LOW (ref 4.0–10.5)
nRBC: 0 % (ref 0.0–0.2)

## 2024-03-05 LAB — I-STAT CG4 LACTIC ACID, ED: Lactic Acid, Venous: 1.3 mmol/L (ref 0.5–1.9)

## 2024-03-05 LAB — LIPASE, BLOOD: Lipase: 40 U/L (ref 11–51)

## 2024-03-05 MED ORDER — IOHEXOL 350 MG/ML SOLN
75.0000 mL | Freq: Once | INTRAVENOUS | Status: AC | PRN
  Administered 2024-03-05: 75 mL via INTRAVENOUS

## 2024-03-05 MED ORDER — ACETAMINOPHEN 325 MG PO TABS
650.0000 mg | ORAL_TABLET | Freq: Once | ORAL | Status: AC
  Administered 2024-03-05: 650 mg via ORAL
  Filled 2024-03-05: qty 2

## 2024-03-05 MED ORDER — KETOROLAC TROMETHAMINE 15 MG/ML IJ SOLN
15.0000 mg | Freq: Once | INTRAMUSCULAR | Status: AC
  Administered 2024-03-05: 15 mg via INTRAVENOUS
  Filled 2024-03-05: qty 1

## 2024-03-05 MED ORDER — FENTANYL CITRATE (PF) 50 MCG/ML IJ SOSY
50.0000 ug | PREFILLED_SYRINGE | Freq: Once | INTRAMUSCULAR | Status: AC
  Administered 2024-03-05: 50 ug via INTRAVENOUS
  Filled 2024-03-05: qty 1

## 2024-03-05 MED ORDER — ONDANSETRON HCL 4 MG PO TABS: 4.0000 mg | ORAL_TABLET | Freq: Four times a day (QID) | ORAL | 0 refills | Status: AC

## 2024-03-05 MED ORDER — SODIUM CHLORIDE 0.9 % IV SOLN
1.0000 g | Freq: Once | INTRAVENOUS | Status: AC
  Administered 2024-03-05: 1 g via INTRAVENOUS
  Filled 2024-03-05: qty 10

## 2024-03-05 MED ORDER — SODIUM CHLORIDE 0.9 % IV BOLUS
1000.0000 mL | Freq: Once | INTRAVENOUS | Status: AC
  Administered 2024-03-05: 1000 mL via INTRAVENOUS

## 2024-03-05 MED ORDER — ONDANSETRON HCL 4 MG/2ML IJ SOLN
4.0000 mg | Freq: Once | INTRAMUSCULAR | Status: AC
  Administered 2024-03-05: 4 mg via INTRAVENOUS
  Filled 2024-03-05: qty 2

## 2024-03-05 MED ORDER — CEPHALEXIN 500 MG PO CAPS: 500.0000 mg | ORAL_CAPSULE | Freq: Three times a day (TID) | ORAL | 0 refills | Status: AC

## 2024-03-05 NOTE — ED Provider Notes (Incomplete)
 3:26 PM Patient signed out to me by previous ED physician. Pt is a 74 yo female presenting for upper abdominal pain, sore throat, cloudy urine, cough, fever, and sore throat. New emesis this morning.   UA positive for UTI. Rocephin  given.   CT demonstrates: 1. Cirrhosis with stable sequela of portal venous hypertension manifested by splenomegaly and numerous abdominal varices. 2. Distal colonic diverticulosis without diverticulitis. 3. Fat containing umbilical hernia.  P: cxr pending  Physical Exam  BP 131/64   Pulse 81   Temp 98.8 F (37.1 C) (Oral)   Resp 18   Ht 5' 1 (1.549 m)   Wt 124.7 kg   SpO2 98%   BMI 51.96 kg/m   Physical Exam  Procedures  Procedures  ED Course / MDM    Medical Decision Making Amount and/or Complexity of Data Reviewed Labs: ordered. Radiology: ordered.  Risk Prescription drug management.   ***

## 2024-03-05 NOTE — Discharge Instructions (Signed)
 Today you were diagnosed with a urinary tract infection. Your kidney function is stable. No abnormalities seen on CT related to the infection. You were given IV antibiotics in emergency department and a prescription was sent to your pharmacy.   You have known liver cirrhosis and splenomegaly. Please continue to follow annually with your primary care provider and or GI doctor for further recs.

## 2024-03-05 NOTE — ED Notes (Signed)
 CCMD Called

## 2024-03-05 NOTE — ED Notes (Signed)
 Patient transported to X-ray

## 2024-03-05 NOTE — ED Triage Notes (Addendum)
 Patient BIB [facility] for abdominal pain x 4 days with fever up to 103.55F, vomiting started today, previously able to tolerate food but not today. Took 650mg  tylenol  at home which helped fever. LUQ pain is the worst. EMS gave 4mg  zofran  and 250mL NS en route.

## 2024-03-06 LAB — BLOOD CULTURE ID PANEL (REFLEXED) - BCID2

## 2024-03-07 ENCOUNTER — Telehealth: Payer: Self-pay

## 2024-03-07 LAB — URINE CULTURE: Culture: >=100000 — AB

## 2024-03-07 NOTE — Telephone Encounter (Signed)
Requested to be re-faxed

## 2024-03-07 NOTE — Telephone Encounter (Signed)
 Copied from CRM #[id number]. Topic: Clinical - Home Health Verbal Orders >> Mar 07, 2024  1:24 PM Delon DASEN wrote: Caller/Agency: Rosabel Gurney [facility] Callback Number: [phone] secure line Service Requested: all Frequency: n/a Any new concerns about the patient? No- asking if plan of care was received need it back signed

## 2024-03-08 ENCOUNTER — Telehealth (HOSPITAL_BASED_OUTPATIENT_CLINIC_OR_DEPARTMENT_OTHER): Payer: Self-pay | Admitting: *Deleted

## 2024-03-08 NOTE — Telephone Encounter (Signed)
 Post ED Visit - Positive Culture Follow-up  Culture report reviewed by antimicrobial stewardship pharmacist: [facility] [x]  Rankin Sams, [place].D. []  Venetia Gully, Pharm.D., BCPS AQ-ID []  Garrel Crews, Pharm.D., BCPS []  Almarie Lunger, Pharm.D., BCPS []  [place], [place].D., BCPS, AAHIVP []  Rosaline Bihari, Pharm.D., BCPS, AAHIVP []  Vernell Meier, PharmD, BCPS []  Latanya Hint, PharmD, BCPS []  Donald Medley, PharmD, BCPS []  Rocky Bold, PharmD []  Dorothyann Alert, PharmD, BCPS []  Morene Babe, PharmD  [facility] []  Rosaline Edison, PharmD []  Romona Bliss, PharmD []  Dolphus Roller, PharmD []  Veva Seip, Rph []  Vernell Daunt) Leonce, PharmD []  Eva Allis, PharmD []  Rosaline Millet, PharmD []  Iantha Batch, PharmD []  Arvin Gauss, PharmD []  Wanda Hasting, PharmD []  Ronal Rav, PharmD []  Rocky Slade, PharmD []  Bard Jeans, PharmD   Positive urine culture Treated with cephalexin , organism sensitive to the same and no further patient follow-up is required at this time.  [name] [name] 03/08/2024, 12:40 PM

## 2024-03-09 LAB — CULTURE, BLOOD (ROUTINE X 2)
Culture: NO GROWTH
Special Requests: ADEQUATE

## 2024-03-13 ENCOUNTER — Ambulatory Visit: Admitting: Orthopaedic Surgery

## 2024-03-26 ENCOUNTER — Ambulatory Visit: Payer: Self-pay

## 2024-05-30 ENCOUNTER — Ambulatory Visit: Payer: Self-pay | Admitting: Internal Medicine

## 2024-10-12 ENCOUNTER — Ambulatory Visit: Admitting: Hematology and Oncology
